# Patient Record
Sex: Female | Born: 1940 | Race: Black or African American | Hispanic: No | Smoking: Former smoker
Health system: Southern US, Community
[De-identification: ages and names within clinical notes are randomized; demographics above are authoritative.]

## PROBLEM LIST (undated history)

## (undated) DIAGNOSIS — M4800 Spinal stenosis, site unspecified: Secondary | ICD-10-CM

## (undated) DIAGNOSIS — M2550 Pain in unspecified joint: Secondary | ICD-10-CM

## (undated) DIAGNOSIS — M5430 Sciatica, unspecified side: Secondary | ICD-10-CM

## (undated) DIAGNOSIS — M1990 Unspecified osteoarthritis, unspecified site: Secondary | ICD-10-CM

## (undated) DIAGNOSIS — H04129 Dry eye syndrome of unspecified lacrimal gland: Secondary | ICD-10-CM

## (undated) DIAGNOSIS — E785 Hyperlipidemia, unspecified: Secondary | ICD-10-CM

## (undated) DIAGNOSIS — J40 Bronchitis, not specified as acute or chronic: Secondary | ICD-10-CM

## (undated) DIAGNOSIS — N3281 Overactive bladder: Secondary | ICD-10-CM

## (undated) DIAGNOSIS — E7800 Pure hypercholesterolemia, unspecified: Secondary | ICD-10-CM

## (undated) DIAGNOSIS — J449 Chronic obstructive pulmonary disease, unspecified: Secondary | ICD-10-CM

## (undated) DIAGNOSIS — K219 Gastro-esophageal reflux disease without esophagitis: Secondary | ICD-10-CM

## (undated) DIAGNOSIS — R7303 Prediabetes: Secondary | ICD-10-CM

## (undated) DIAGNOSIS — I499 Cardiac arrhythmia, unspecified: Secondary | ICD-10-CM

## (undated) DIAGNOSIS — M069 Rheumatoid arthritis, unspecified: Secondary | ICD-10-CM

## (undated) DIAGNOSIS — R682 Dry mouth, unspecified: Secondary | ICD-10-CM

## (undated) DIAGNOSIS — E669 Obesity, unspecified: Secondary | ICD-10-CM

## (undated) DIAGNOSIS — G629 Polyneuropathy, unspecified: Secondary | ICD-10-CM

## (undated) DIAGNOSIS — I10 Essential (primary) hypertension: Secondary | ICD-10-CM

## (undated) DIAGNOSIS — Z1589 Genetic susceptibility to other disease: Secondary | ICD-10-CM

## (undated) DIAGNOSIS — M810 Age-related osteoporosis without current pathological fracture: Secondary | ICD-10-CM

## (undated) DIAGNOSIS — M459 Ankylosing spondylitis of unspecified sites in spine: Secondary | ICD-10-CM

## (undated) DIAGNOSIS — J302 Other seasonal allergic rhinitis: Secondary | ICD-10-CM

## (undated) DIAGNOSIS — G5600 Carpal tunnel syndrome, unspecified upper limb: Secondary | ICD-10-CM

## (undated) DIAGNOSIS — M549 Dorsalgia, unspecified: Secondary | ICD-10-CM

## (undated) DIAGNOSIS — F419 Anxiety disorder, unspecified: Secondary | ICD-10-CM

## (undated) DIAGNOSIS — K5900 Constipation, unspecified: Secondary | ICD-10-CM

## (undated) DIAGNOSIS — K589 Irritable bowel syndrome without diarrhea: Secondary | ICD-10-CM

## (undated) HISTORY — PX: HERNIA REPAIR: SHX51

## (undated) HISTORY — DX: Spinal stenosis, site unspecified: M48.00

## (undated) HISTORY — PX: BREAST SURGERY: SHX581

## (undated) HISTORY — DX: Obesity, unspecified: E66.9

## (undated) HISTORY — DX: Dry eye syndrome of unspecified lacrimal gland: H04.129

## (undated) HISTORY — DX: Anxiety disorder, unspecified: F41.9

## (undated) HISTORY — DX: Dorsalgia, unspecified: M54.9

## (undated) HISTORY — DX: Rheumatoid arthritis, unspecified: M06.9

## (undated) HISTORY — DX: Hyperlipidemia, unspecified: E78.5

## (undated) HISTORY — PX: EYE SURGERY: SHX253

## (undated) HISTORY — PX: BACK SURGERY: SHX140

## (undated) HISTORY — DX: Constipation, unspecified: K59.00

## (undated) HISTORY — PX: TONSILLECTOMY: SUR1361

## (undated) HISTORY — DX: Sciatica, unspecified side: M54.30

## (undated) HISTORY — DX: Essential (primary) hypertension: I10

## (undated) HISTORY — DX: Pain in unspecified joint: M25.50

## (undated) HISTORY — DX: Genetic susceptibility to other disease: Z15.89

## (undated) HISTORY — DX: Dry mouth, unspecified: R68.2

---

## 1993-08-20 HISTORY — PX: ABDOMINAL HYSTERECTOMY: SHX81

## 1998-01-03 ENCOUNTER — Other Ambulatory Visit: Admission: RE | Admit: 1998-01-03 | Discharge: 1998-01-03 | Payer: Self-pay | Admitting: Nephrology

## 1998-01-04 ENCOUNTER — Other Ambulatory Visit: Admission: RE | Admit: 1998-01-04 | Discharge: 1998-01-04 | Payer: Self-pay | Admitting: Nephrology

## 1999-05-30 ENCOUNTER — Other Ambulatory Visit: Admission: RE | Admit: 1999-05-30 | Discharge: 1999-05-30 | Payer: Self-pay | Admitting: Nephrology

## 2003-07-12 ENCOUNTER — Encounter: Admission: RE | Admit: 2003-07-12 | Discharge: 2003-07-12 | Payer: Self-pay | Admitting: Neurosurgery

## 2003-07-27 ENCOUNTER — Encounter: Admission: RE | Admit: 2003-07-27 | Discharge: 2003-07-27 | Payer: Self-pay | Admitting: Neurosurgery

## 2003-08-16 ENCOUNTER — Encounter: Admission: RE | Admit: 2003-08-16 | Discharge: 2003-08-20 | Payer: Self-pay | Admitting: Neurosurgery

## 2003-08-21 ENCOUNTER — Encounter: Admission: RE | Admit: 2003-08-21 | Discharge: 2003-10-05 | Payer: Self-pay | Admitting: Neurosurgery

## 2003-10-01 ENCOUNTER — Encounter: Admission: RE | Admit: 2003-10-01 | Discharge: 2003-10-01 | Payer: Self-pay | Admitting: Neurosurgery

## 2004-01-21 ENCOUNTER — Inpatient Hospital Stay (HOSPITAL_COMMUNITY): Admission: RE | Admit: 2004-01-21 | Discharge: 2004-01-27 | Payer: Self-pay | Admitting: Neurosurgery

## 2004-03-15 ENCOUNTER — Ambulatory Visit (HOSPITAL_COMMUNITY): Admission: RE | Admit: 2004-03-15 | Discharge: 2004-03-15 | Payer: Self-pay | Admitting: Neurosurgery

## 2004-04-28 ENCOUNTER — Encounter
Admission: RE | Admit: 2004-04-28 | Discharge: 2004-07-27 | Payer: Self-pay | Admitting: Physical Medicine & Rehabilitation

## 2004-05-02 ENCOUNTER — Ambulatory Visit: Payer: Self-pay | Admitting: Physical Medicine & Rehabilitation

## 2004-07-19 ENCOUNTER — Ambulatory Visit (HOSPITAL_COMMUNITY): Admission: RE | Admit: 2004-07-19 | Discharge: 2004-07-19 | Payer: Self-pay | Admitting: Orthopedic Surgery

## 2004-08-16 ENCOUNTER — Encounter: Admission: RE | Admit: 2004-08-16 | Discharge: 2004-09-27 | Payer: Self-pay | Admitting: Orthopedic Surgery

## 2005-10-31 ENCOUNTER — Ambulatory Visit: Payer: Self-pay | Admitting: Physical Medicine & Rehabilitation

## 2005-10-31 ENCOUNTER — Inpatient Hospital Stay (HOSPITAL_COMMUNITY): Admission: RE | Admit: 2005-10-31 | Discharge: 2005-11-05 | Payer: Self-pay | Admitting: Orthopedic Surgery

## 2005-11-26 ENCOUNTER — Encounter: Admission: RE | Admit: 2005-11-26 | Discharge: 2005-12-28 | Payer: Self-pay | Admitting: Orthopedic Surgery

## 2006-02-15 ENCOUNTER — Emergency Department (HOSPITAL_COMMUNITY): Admission: EM | Admit: 2006-02-15 | Discharge: 2006-02-16 | Payer: Self-pay | Admitting: Emergency Medicine

## 2006-08-05 ENCOUNTER — Encounter: Admission: RE | Admit: 2006-08-05 | Discharge: 2006-08-05 | Payer: Self-pay | Admitting: Nephrology

## 2007-08-08 ENCOUNTER — Encounter: Admission: RE | Admit: 2007-08-08 | Discharge: 2007-08-08 | Payer: Self-pay | Admitting: Nephrology

## 2007-12-03 ENCOUNTER — Emergency Department (HOSPITAL_COMMUNITY): Admission: EM | Admit: 2007-12-03 | Discharge: 2007-12-03 | Payer: Self-pay | Admitting: Emergency Medicine

## 2008-08-26 ENCOUNTER — Encounter: Admission: RE | Admit: 2008-08-26 | Discharge: 2008-08-26 | Payer: Self-pay | Admitting: Nephrology

## 2009-02-02 ENCOUNTER — Encounter: Admission: RE | Admit: 2009-02-02 | Discharge: 2009-02-02 | Payer: Self-pay | Admitting: Nephrology

## 2009-10-27 ENCOUNTER — Encounter: Admission: RE | Admit: 2009-10-27 | Discharge: 2009-10-27 | Payer: Self-pay | Admitting: Specialist

## 2009-10-31 ENCOUNTER — Ambulatory Visit
Admission: RE | Admit: 2009-10-31 | Discharge: 2009-11-01 | Payer: Self-pay | Source: Home / Self Care | Admitting: Specialist

## 2010-11-12 LAB — BASIC METABOLIC PANEL
Calcium: 9.3 mg/dL (ref 8.4–10.5)
Chloride: 102 mEq/L (ref 96–112)
Creatinine, Ser: 0.78 mg/dL (ref 0.4–1.2)
GFR calc non Af Amer: >60 mL/min (ref >60)
Potassium: 4.3 mEq/L (ref 3.5–5.1)
Sodium: 134 mEq/L — ABNORMAL LOW (ref 135–145)

## 2011-01-05 NOTE — Op Note (Signed)
NAMEMarland Kitchen  Joy [name], Joy [name] NO.:  [id number]   MEDICAL RECORD NO.:  [id number]          PATIENT TYPE:  OIB   LOCATION:  2899                         FACILITY:  [facility]   PHYSICIAN:  Myrtie Neither, MD      DATE OF BIRTH:  October 06, 1940   DATE OF PROCEDURE:  07/19/2004  DATE OF DISCHARGE:                                 OPERATIVE REPORT   PREOPERATIVE DIAGNOSIS:  1.  Internal derangement.  2.  Medial meniscal tear.  3.  Synovitis.   POSTOPERATIVE DIAGNOSES:  1.  Internal derangement.  2.  Medial meniscal tear, posterior horn.  3.  Lateral meniscal tear.  4.  Gouty synovitis.   ANESTHESIA:  General.   PROCEDURE:  Arthroscopic medial and lateral meniscectomy, complete  synovectomy.   The patient was taken to the operating room after given adequate preop  medications, given general anesthesia and intubated.  The right knee was  prepped with Duraprep and draped in a sterile manner.  A tourniquet used for  hemostasis.  A 1/2 inch puncture wound made along the anterior, medial, and  lateral joint lines.  Inflow was through the medial suprapatellar pouch  area.  Inspection of the joint revealed tophaceous gouty crystals throughout  the joint, a tear of the medial meniscal along the posterior horn, and tear  of the lateral meniscus along the midsubstance and posteriorly.  Anterior  cruciate was intact.  Thickened synovium, both medial and lateral  compartment and suprapatellar pouch area.  Complete synovectomy was done  with the synovial shaver, followed by partial resection of the medial  meniscus with basket forceps as well as the lateral meniscus.  A meniscal  shaver was used for further debridement of the joint and chondroplasty of  the medial compartment, medial femoral condyle.  There was a breakdown of  the articular surface with gouty involvement.  After a thorough debridement  and irrigation of the joint, the joint was then closed with 4-0 nylon.  Marcaine 0.25%  plain 10 mL was placed into the knee.  A compressive dressing  was applied.  The patient tolerated the procedure quite well and went to the  recovery room in a stable and satisfactory condition.  The patient is being  discharged home on Percocet 10/650 mg q.4h. p.r.n. for pain, partial  weightbearing, ice packs, elevation, use of crutches, and to return to the  office in one week.  The patient is also placed in indomethacin 75 mg one  b.i.d.  The patient is being discharged in stable and satisfactory  condition.      AC/MEDQ  D:  07/19/2004  T:  07/19/2004  Job:  [id number]

## 2011-01-05 NOTE — Op Note (Signed)
NAMEVERYL, ABRIL NO.:  [id number]   MEDICAL RECORD NO.:  [id number]          PATIENT TYPE:  INP   LOCATION:  X002                         FACILITY:  [facility]   PHYSICIAN:  Ollen Gross, M.D.    DATE OF BIRTH:  1941/02/23   DATE OF PROCEDURE:  10/31/2005  DATE OF DISCHARGE:                                 OPERATIVE REPORT   PREOPERATIVE DIAGNOSIS:  Osteoarthritis right knee.   POSTOPERATIVE DIAGNOSIS:  Osteoarthritis right knee.   PROCEDURE:  Right total knee arthroplasty.   SURGEON:  Ollen Gross, M.D.   ASSISTANT:  Avel Peace, PA-C.   ANESTHESIA:  General with postop Marcaine pain pump.   ESTIMATED BLOOD LOSS:  Minimal.   DRAINS:  Hemovac x1.   TOURNIQUET TIME:  46 minutes at 300 mmHg.   COMPLICATIONS:  None.   CONDITION:  Stable to recovery room.   CLINICAL NOTE:  [name] is a 70 year old female who had severe end-stage  osteoarthritis of the right knee with intractable pain. She has failed  nonoperative management including injections. She has an allergy to nickel  and cobalt thus we had to use a custom-made titanium femoral component. She  presents now for total knee arthroplasty.   PROCEDURE IN DETAIL:  After successful administration of general anesthetic,  a tourniquet was placed high on the right thigh and right lower extremity  prepped and draped in the usual sterile fashion. Extremity was wrapped in  Esmarch, knee flexed and tourniquet inflated to 300 mmHg. A midline incision  is made with a 10 blade through the subcutaneous tissue to the level of the  extensor mechanism. A fresh blade is used to make a medial parapatellar  arthrotomy. The soft tissue over the proximal medial tibia is  subperiosteally elevated to the joint line with a knife and into the  semimembranosus bursa with a Cobb elevator. The soft tissue over the  proximal lateral tibia is elevated with attention being paid to avoiding the  patellar tendon on the tibial  tubercle. Patella is everted, knee flexed 90  degrees and ACL and PCL removed. A drill was used to create a starting hole  in the distal femur, the canal was thoroughly irrigated. A 5 degree right  valgus alignment guide is placed. Referencing off the posterior condyles,  rotation is marked and the block pinned to remove 10 mm off the distal  femur. Distal femoral resection is made with an oscillating saw. A sizing  block is placed, size 3 is the most appropriate. Rotation is marked off the  epicondylar axis. A size 3 cutting block is placed and then the anterior,  posterior and chamfer cuts are made.   The tibia is subluxed forward and the menisci are removed. The  extramedullary tibial alignment guide is placed referencing proximally at  the medial aspect of tibial tubercle and distally along the second  metatarsal axis and tibial crest. The block is pinned to remove 10 mm from  the nondeficient lateral side. Tibial resection is made an oscillating saw.  A size 3 is also the most appropriate tibial component. We completed  our  femoral preparation with the intercondylar cut on the femur. The size 3  posterior stabilized femoral trial was placed. We then placed the size 3  fixed bearing tibial trial with a 10-mm fixed bearing posterior stabilized  insert. We placed the knee through a range of motion to mark the rotation of  the tibial tray. She had great stability with full extension and excellent  varus and valgus balance throughout full range of motion. The patella was  then everted, thickness measured to be 20 mm. Freehand resection is taken to  11 mm, 35 template is placed, lug holes are drilled, trial patella is placed  and it tracks normally. The proximal tibia is then prepared with the modular  drill and keel punch for the fixed bearing component. The osteophytes are  then removed off the posterior femur with the trial in place. All trials are  removed and the cut bone surfaces  prepared with pulsatile lavage. The cement  is mixed and once ready for implantation, the size 3 fixed bearing tibial  tray, size 3 posterior stabilized femur and the 35 patella are cemented into  place and the patella is held with a clamp. A trial 10 mm insert is placed,  knee held in full extension and all extruded cement removed. Please note  that the femur and tibia are both titanium because of her allergy. Once the  cement is fully hardened then the permanent 10-mm fixed bearing posterior  stabilized insert is placed in the tibial tray. The wound was copiously  irrigated with saline solution and the extensor mechanism closed over a  Hemovac drain with interrupted #1 PDS. Flexion against gravity is about 135  degrees. The tourniquet was released with a total time of 46 minutes. The  subcu was closed with interrupted 2-0 Vicryl, subcuticular with running 4-0  Monocryl. Incision is cleaned and dried and Steri-Strips and a bulky sterile  dressing applied. Prior to this, I had placed the catheter for the Marcaine  pain pump and the pump was initiated. The knee was placed into the knee  immobilizer, the patient is then awakened and transported to recovery in  stable condition.      Ollen Gross, M.D.  Electronically Signed     FA/MEDQ  D:  10/31/2005  T:  11/01/2005  Job:  [id number]

## 2011-01-05 NOTE — H&P (Signed)
Joy [name], SITTS NO.:  [id number]   MEDICAL RECORD NO.:  [id number]          PATIENT TYPE:  INP   LOCATION:  1505                         FACILITY:  [facility]   PHYSICIAN:  Ollen Gross, M.D.    DATE OF BIRTH:  [birth year]   DATE OF ADMISSION:  10/31/2005  DATE OF DISCHARGE:  11/05/2005                                HISTORY & PHYSICAL   CHIEF COMPLAINT:  Right knee pain.   HISTORY OF PRESENT ILLNESS:  Patient is a 70 year old female who has been  seen by Dr. Lequita Halt for ongoing right knee pain.  She is scheduled to have a  right total knee arthroplasty.  She had to have work-up for metal allergies.  She has had ongoing knee pain for quite some time now.  She had been seen by  Dr. Myrtie Neither in the past.  She initially had a knee arthroscopy done  back in November of 2005, and had some initial improvement following that,  but shortly after, she was noted to be bone on bone with progressive  arthritis.  She has been seen by Dr. Lequita Halt and felt she has reached a  point where she would benefit from undergoing knee replacement.  She has  been worked up for her allergies and had history compatible with latex  allergy and also contact allergy to nickel and cobalt chrome.  Custom  titanium implant had to be made.  Once the custom implant was made, she was  scheduled for surgery.   ALLERGIES:  LATEX allergy and METAL allergy to NICKEL, COBALT CHROME and  also STAPLES.   PAST MEDICAL HISTORY:  1.  Hypercholesterolemia.  2.  Hypertension.  3.  Restless leg syndrome.  4.  Previous history of transfusions.  5.  Gout.   PAST SURGICAL HISTORY:  1.  Right knee arthroscopy.  2.  Back surgery.  3.  Hysterectomy.   FAMILY HISTORY:  Significant for diabetes, cholesterol and blood pressure.   SOCIAL HISTORY:  Divorced, single, one daughter.  Nonsmoker.  Very seldom  intake of alcohol.   CURRENT MEDICATIONS:  Indocin, Lipitor, Diovan/hydrochlorothiazide, Lyrica,  glucosamine, Percocet, multivitamin, Omega III.   REVIEW OF SYSTEMS:  NEUROLOGIC:  No seizures, syncope or paralysis.  RESPIRATORY:  She did have a cold over the holidays but has resolved.  No  shortness of breath, productive cough or hemoptysis.  CARDIOVASCULAR:  No  chest pain, angina or orthopnea.  GI:  No nausea, vomiting, diarrhea or  constipation.  GU:  No dysuria or hematuria or discharge.  MUSCULOSKELETAL:  Right knee.   PHYSICAL EXAMINATION:  GENERAL APPEARANCE:  A 70 year old [place]  female, well-nourished, well-developed, overweight, short stature, no acute  distress, she is alert and oriented, cooperative and pleasant.  VITAL SIGNS:  Pulse 60, respirations 12, blood pressure 132/74.  HEENT:  Normocephalic and atraumatic.  Pupils round and reactive.  Oropharynx clear.  EOM intact.  She does have upper and lower dentures.  NECK:  Supple.  CHEST:  Clear anterior and posterior chest walls.  CARDIOVASCULAR:  Regular rate and rhythm, no murmur, S1 and S2 noted.  ABDOMEN:  Soft, slightly round, slightly protuberant abdomen.  Bowel sounds  present.  BREASTS/GENITALIA:  Not done.  Not pertinent.  EXTREMITIES:  Right knee:  Right knee shows marked crepitus noted on passive  range of motion.  Lacks about 10 degrees of full extension with flexion of  about 115 degrees, no instability.   IMPRESSION:  1.  Osteoarthritis right knee.  2.  Hypercholesterolemia.  3.  Hypertension.  4.  Restless leg syndrome.  5.  History of transfusion.  6.  Gout.  7.  Latex allergies.  8.  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   PLAN:  Patient admitted to [facility] to undergo right total knee  arthroplasty.  Surgery will be performed by Ollen Gross, M.D.      Alexzandrew L. Julien Girt, P.A.      Ollen Gross, M.D.  Electronically Signed    ALP/MEDQ  D:  11/18/2005  T:  11/20/2005  Job:  [id number]

## 2011-01-05 NOTE — Discharge Summary (Signed)
Joy [name], ION NO.:  [id number]   MEDICAL RECORD NO.:  [id number]          PATIENT TYPE:  INP   LOCATION:  1505                         FACILITY:  [facility]   PHYSICIAN:  Ollen Gross, M.D.    DATE OF BIRTH:  [birth year]   DATE OF ADMISSION:  10/31/2005  DATE OF DISCHARGE:  11/05/2005                                 DISCHARGE SUMMARY   ADMITTING DIAGNOSES:  1.  Osteoarthritis, right knee.  2.  Hypercholesterolemia.  3.  Hypertension.  4.  Restless leg syndrome.  5.  Gout.  6.  History of transfusion.  7.  Latex allergy.  8.  Nickel allergy.   DISCHARGE DIAGNOSES:  1.  Osteoarthritis, right knee, status post right total knee arthroplasty.  2.  Postoperative blood loss anemia, did not require transfusion.  3.  Hypercholesterolemia.  4.  Hypertension.  5.  Restless leg syndrome.  6.  Gout.  7.  History of transfusion.  8.  Latex allergy.  9.  Nickel allergy.   PROCEDURE:  On October 31, 2005, right total knee surgery by Dr. Lequita Halt,  assistant Julien Girt, anesthesia general, tourniquet time 46 minutes at 300  mmHg.   CONSULTS:  Rehab services.   BRIEF HISTORY:  Ms. Juneau is a 70 year old female with severe end-stage  arthritis of the right knee with intractable pain who failed operative  management including injections.  She has an allergy to nickel and cobalt,  thus made a custom-made titanium femoral component.  Now presents for total  knee arthroplasty.   LABORATORY DATA:  CBC on admission showed hemoglobin of 13.3, hematocrit of  38.6, white cell count normal at 5.4, serial H&H.s. were followed.  Hemoglobin dropped to 10.1, drifted down to 9.9, last noted at 9.3 and 27.8  hematocrit.  PT and PTT on admission 12.4 and 33, respectively.  Serum  protimes followed.  Last noted PT INR 18.3 and 1.5.  Chem-12 on admission  within normal limits.  Serum BMETs were followed.  Electrolytes remained  within normal limits.  Pre __________ negative.   Chest  x-ray October 26, 2005, no acute cardiopulmonary disease.   EKG dated September 14, 2005 was a faxed tracing.  Sinus rhythm, unconfirmed.   HOSPITAL COURSE:  Admitted to [facility], tolerated the procedure  well, later to the recovery room and the orthopedic floor.  Started on PCA  and p.o. analgesics for pain following surgery.  Given postop IV  antibiotics.  On day 1, was doing fairly well but in some pain, using p.o.  medications.  Supplementing with PCAs.  Started getting up with physical  therapy.  By the following day, was doing a little bit better.  By day 2,  she was doing a little bit better, just felt stiff.  Wanted to look into  [facility].  Therefore, consult was called.  The patient was seen  in evaluation and felt that she would be okay for SACU level.  Decided she  would be transferred over at which time a bed became available.  She started  getting up with physical therapy a  little bit more on day 2 and actually  walked 60 feet.  Dressing changed on day 2.  Incision looked good.  Continue  with daily dressing changes.  By day 3, she continued to progress from a  therapy standpoint, got up to about 80 feet and then later 200 feet.  She  was followed through the weekend and was progressing well.  Unfortunately,  they felt she was too high level for SACU unit and also that she was unable  to go home, however, therefore, discharge planning looked for a bed.  Bed  officer sent out unable assign.  Bed became available at [facility] extended  care facility.  She was seen on Monday rounds by Dr. [name] Foot.  Bed was  available.  She was doing well postoperatively and she was transferred out  at that time.   DISCHARGE PLAN:  Patient transferred over to [facility] extended care.   DISCHARGE DIAGNOSES:  Please see above.   DISCHARGE MEDICATIONS:  1.  Current medications Coumadin protocol.  Please titrate the INR between 2-      3.  She needs to be on Coumadin for a  total of three weeks from date of      surgery which was October 31, 2005.  2.  Colace 100 mg p.o. b.i.d.  3.  She is also to take Diovan hydrochlorothiazide 160/25 p.o. daily.  4.  Lipitor 20 mg daily.  5.  Lyrica 75 mg p.o. q.h.s.  6.  Percocet one or two every 4-6 hours as needed for pain.  7.  Tylenol one or two every 4-6 hours as needed for non-pain type of      headache.  8.  Robaxin 500 mg  p.o. q.6-8h. p.r.n. spasm.  9.  Restoril 15-30 mg p.o. q.h.s. p.r.n. sleep.  10. Laxative of choice.  11. Enema of choice.  12. Mylanta 30 mL p.o. p.r.n.   DIET:  Cardiac diet.   ACTIVITY:  She is weightbearing as tolerated to the right lower extremity.  Continue gait training, ambulation, ADLs as per PT and OT while on the  extended care unit.  Daily dressing changes to the knee.  She may start  showering.  Needs to be up of bed minimum b.i.d. and continue total knee  protocol.   FOLLOWUP:  The patient will follow up with Dr.Aluisio in the office two  weeks from surgery, contact the office at [phone] to arrange for a time of  transfer of the patient over for further evaluation and care.   DISPOSITION:  [facility].   CONDITION ON DISCHARGE:  Improved.      Alexzandrew L. Julien Girt, P.A.      Ollen Gross, M.D.  Electronically Signed    ALP/MEDQ  D:  11/05/2005  T:  11/05/2005  Job:  [id number]   cc:   Mila Homer. [name] Foot, M.D.  Fax: [phone]   Jarome Matin, M.D.  Fax: [phone]

## 2011-01-05 NOTE — H&P (Signed)
NAMEMarland Kitchen  Joy [name], Joy [name] NO.:  [id number]   MEDICAL RECORD NO.:  [id number]          PATIENT TYPE:  OIB   LOCATION:  2899                         FACILITY:  [facility]   PHYSICIAN:  Myrtie Neither, MD      DATE OF BIRTH:  06/28/1941   DATE OF ADMISSION:  07/19/2004  DATE OF DISCHARGE:                                HISTORY & PHYSICAL   CHIEF COMPLAINT:  Painful, swollen right knee.   HISTORY OF PRESENT ILLNESS:  This is a 70 year old female who has been  followed in the office for internal derangement and synovitis involving the  right knee.  Over the past year the patient has been treated with  therapeutic injections, anti-inflammatories with progressive worsening in  the past few months.  MRI demonstrates a meniscal tear of the medial  meniscus.   PAST MEDICAL HISTORY:  1.  Degenerative arthritis.  2.  High blood pressure.  3.  Degenerative disk disease.  4.  Spinal fusion, lumbar.   FAMILY HISTORY:  Noncontributory.   SOCIAL HISTORY:  The patient denies use of alcohol or tobacco.   REVIEW OF SYSTEMS:  Episodic lower back and leg pain.  No cardiac or  respiratory symptoms.  No urinary or bowel symptoms.   ALLERGIES:  NICKEL, STAPLES, LATEX.   MEDICATIONS:  1.  Diovan/HCTZ 160/25 mg daily.  2.  Sulindac 200 mg b.i.d.  3.  Oxycodone one q.4h. p.r.n.   PHYSICAL EXAMINATION:  GENERAL:  Alert and oriented, in no acute distress.  VITAL SIGNS:  Temperature 98.7, pulse 80, respirations 18, blood pressure  110/60.  Height 62-1/2 inches, weight 219.  HEENT:  Head normocephalic.  Eyes:  Conjunctivae and sclerae clear.  NECK:  Supple.  CHEST:  Clear.  CARDIAC:  S1, S2, regular.  EXTREMITIES:  Right knee genu varum, tender medial compartment.  There is a  palpable and audible click.  Tender also lateral compartment with moderate  effusion.  Negative drawer, negative Lachman, negative pivot shift.   MRI demonstrates a meniscal tear.   IMPRESSION:  1.  Internal  derangement, right knee.  2.  Meniscal tear.  3.  Synovitis, right knee.   PLAN:  Arthroscopy of the right knee.      AC/MEDQ  D:  07/19/2004  T:  07/19/2004  Job:  [id number]

## 2011-01-05 NOTE — Op Note (Signed)
NAMESCARLETTROSE, COSTILOW                        ACCOUNT NO.:  [id number]   MEDICAL RECORD NO.:  [id number]                   PATIENT TYPE:  INP   LOCATION:  3314                                 FACILITY:  [facility]   PHYSICIAN:  Coletta Memos, M.D.                  DATE OF BIRTH:  Oct 28, 1940   DATE OF PROCEDURE:  01/21/2004  DATE OF DISCHARGE:                                 OPERATIVE REPORT   PREOPERATIVE DIAGNOSES:  1. Lumbar spondylosis, L3-4, L4-5, L5-S1.  2. Lumbar stenosis, L3-4, L4-5.  3. Spondylolisthesis, L3-4.  4. Displaced disk, L3-4, L4-5.  5. Lumbar radiculopathy.   POSTOPERATIVE DIAGNOSES:  1. Lumbar spondylosis, L3-4, L4-5, L5-S1.  2. Lumbar stenosis, L3-4, L4-5.  3. Spondylolisthesis, L3-4.  4. Displaced disk, L3-4, L4-5.  5. Lumbar radiculopathy.   PROCEDURES:  1. Posterolateral arthrodesis, L3 to S1.  2. Segmental instrumentation, posterolateral, L3 to S1.  3. Posterior lumbar interbody fusion, L3-4.  4. Posterior lumbar interbody fusion L4-5.  5. Lumbar decompression, L5-S1.  6. Morcellized autograft.   SURGEON:  Coletta Memos, M.D.   ASSISTANT:  Stefani Dama, M.D.   COMPLICATIONS:  None.   INDICATIONS:  Joy [name] is a woman whom I have taken care of for some  time.  She has profound stenosis at L3-4 and at L4-5.  She has  spondylolisthesis at L3-4, low back pain and lumbar radiculopathy, and  displaced disk at L3-4 and L4-5.  I recommended and she agreed to undergo a  lumbar decompression and posterolateral arthrodesis with PLIF on the lumbar  spine to decompress all of the nerve roots from L3 through S1 and to provide  for stability after removal of the bone.   OPERATIVE NOTE:  [name] was brought to the operating room, intubated,  and placed under general anesthetic.  She then had a Foley catheter placed  under sterile conditions.  Using Symphony platelet gel, 60 mL of blood was  removed from an IV site.  She was then flipped prone onto body  rolls and all  pressure points were properly padded.  Skin was prepped, and she was draped  in a sterile fashion.  I infiltrated 40 mL of 0.5% lidocaine and 1:200,000  strength epinephrine into the paraspinous musculature and lumbar region.  I  opened the skin with a #10 blade and took this down to the thoracolumbar  fascia.  I then exposed the laminae of L2, L3, L4, L5, and of S1  bilaterally.  I took an x-ray to show where I was and using that as a guide,  I then proceeded with exposure of the transverse processes of L3, L4, L5,  and the sacral alae.  I then with Dr. Verlee Rossetti assistance performed  diskectomies at L3-4 and at L4-5.  Four 11 mm PLIF Peek cages were placed  filled with local autograft which was morcellized.  This was done without  difficulty.  Then using the frameless stereotactic guidance, pedicle screws  were placed, two screws in L3, two in L4, two in L5, and two in S1, first by  drilling, using a probe, and then tapping the screw hole.  X-rays showed the  screws to be in good position.  The PLIF cages were in good position.  A  significant amount of decompression was performed at all levels.  The L3,  L4, L5, and S1 nerve roots were free of any pressure at the end of the  operation.  I then closed the wound in a layered fashion using Vicryl  sutures.  Steri-Strips were used for a sterile dressing.                                               Coletta Memos, M.D.    KC/MEDQ  D:  01/21/2004  T:  01/22/2004  Job:  [id number]

## 2011-01-05 NOTE — Discharge Summary (Signed)
Joy [name], Joy [name]                        ACCOUNT NO.:  [id number]   MEDICAL RECORD NO.:  [id number]                   PATIENT TYPE:  INP   LOCATION:  3038                                 FACILITY:  [facility]   PHYSICIAN:  Coletta Memos, M.D.                  DATE OF BIRTH:  10-08-1940   DATE OF ADMISSION:  01/21/2004  DATE OF DISCHARGE:  01/27/2004                                 DISCHARGE SUMMARY   ADMISSION DIAGNOSES:  1. Lumbar stenosis L3-L4, L4-L5, L5-S1.  2. Lumbar spondylolisthesis L3-L4.  3. Degenerative disc disease L3-L4, L4-L5, L5-S1.  4. Lumbar radiculopathy.   DISCHARGE DIAGNOSES:  1. Lumbar stenosis L3-L4, L4-L5, L5-S1.  2. Lumbar spondylolisthesis L3-L4.  3. Degenerative disc disease L3-L4, L4-L5, L5-S1.  4. Lumbar radiculopathy.   PROCEDURE:  L3 to S1 posterior lateral arthrodesis, pedicle screw fixation,  posterior lumbar interbody fusion L3-L4, L4-L5, L5-S1 decompression.   COMPLICATIONS:  None.   CONDITION ON DISCHARGE:  Alive and well.   DISCHARGE MEDICATIONS:  1. Darvocet-N 100 one to two tablets q.6h. PRN pain.  2. Flexeril 10 mg p.o. q.6h. PRN pain.  3. Decadron taper 4 mg p.o. t.i.d. January 27, 2004, 4 mg p.o. b.i.d. January 28, 2004, 4 mg daily January 29, 2004, then discontinue.   HISTORY OF PRESENT ILLNESS:  The patient is a 70 year old woman who  presented with severe pain in back and lower extremities in the fall of  2004.  MRI revealed severe lumbar stenosis at L3-L4 and L4-L5,  spondylolisthesis at L3-L4 secondary to facet arthropathy and facet  arthropathy also present at L5-S1 and a severely degenerated disc at L4-L5  and severely degenerated disc at L5-S1.  Conservative treatment did not help  as much as the patient would like so we therefore elected to proceed with  operative depression.  The patient was admitted and had an uncomplicated  procedure.  Postoperatively she has actually done very well with her pain.  Her wound has been clean, dry  and without signs of infection.  She did have  an allergic reaction, breaking out in hives and a pruritic, erythematous  rash throughout her body.  This was treated with Benadryl with Decadron and  Pepcid.  She was taken off the morphine PCA.  I also removed her from  Percocet.  She had previously taken Percocet, however, without problems.  As  of now, however, I will keep her off the Percocet as it is not clear whether  was contributing.  The rash started, however, before she started taking the Percocet so more  than likely this was due to morphine.  Her wound at discharge is clean, dry  and without signs of infection.  She is ambulating, has had physical therapy  and will be discharged home.  Coletta Memos, M.D.    KC/MEDQ  D:  01/27/2004  T:  01/28/2004  Job:  [id number]

## 2011-01-17 ENCOUNTER — Other Ambulatory Visit (HOSPITAL_COMMUNITY): Payer: Self-pay | Admitting: Neurosurgery

## 2011-01-17 DIAGNOSIS — M545 Low back pain, unspecified: Secondary | ICD-10-CM

## 2011-01-25 ENCOUNTER — Ambulatory Visit (HOSPITAL_COMMUNITY)
Admission: RE | Admit: 2011-01-25 | Discharge: 2011-01-25 | Disposition: A | Discharge status: Home / Self Care | Payer: Medicare Other | Source: Ambulatory Visit | Attending: Neurosurgery | Admitting: Neurosurgery

## 2011-01-25 DIAGNOSIS — Y849 Medical procedure, unspecified as the cause of abnormal reaction of the patient, or of later complication, without mention of misadventure at the time of the procedure: Secondary | ICD-10-CM | POA: Insufficient documentation

## 2011-01-25 DIAGNOSIS — M545 Low back pain, unspecified: Secondary | ICD-10-CM

## 2011-01-25 DIAGNOSIS — M5126 Other intervertebral disc displacement, lumbar region: Secondary | ICD-10-CM | POA: Insufficient documentation

## 2011-01-25 DIAGNOSIS — T8489XA Other specified complication of internal orthopedic prosthetic devices, implants and grafts, initial encounter: Secondary | ICD-10-CM | POA: Insufficient documentation

## 2011-01-25 MED ORDER — IOHEXOL 180 MG/ML  SOLN
20.0000 mL | Freq: Once | INTRAMUSCULAR | Status: AC | PRN
  Administered 2011-01-25: 20 mL via INTRATHECAL

## 2011-05-15 LAB — POCT RAPID STREP A: Streptococcus, Group A Screen (Direct): POSITIVE — AB

## 2011-08-28 ENCOUNTER — Ambulatory Visit
Admission: RE | Admit: 2011-08-28 | Discharge: 2011-08-28 | Disposition: A | Discharge status: Home / Self Care | Payer: Medicare Other | Source: Ambulatory Visit | Attending: Nephrology | Admitting: Nephrology

## 2011-08-28 ENCOUNTER — Other Ambulatory Visit: Payer: Self-pay | Admitting: Nephrology

## 2011-08-28 DIAGNOSIS — R0602 Shortness of breath: Secondary | ICD-10-CM | POA: Diagnosis not present

## 2011-08-28 DIAGNOSIS — R059 Cough, unspecified: Secondary | ICD-10-CM

## 2011-08-28 DIAGNOSIS — R9389 Abnormal findings on diagnostic imaging of other specified body structures: Secondary | ICD-10-CM | POA: Diagnosis not present

## 2011-08-28 DIAGNOSIS — R05 Cough: Secondary | ICD-10-CM

## 2011-08-28 DIAGNOSIS — R062 Wheezing: Secondary | ICD-10-CM | POA: Diagnosis not present

## 2011-10-02 DIAGNOSIS — M5430 Sciatica, unspecified side: Secondary | ICD-10-CM | POA: Diagnosis not present

## 2011-10-02 DIAGNOSIS — M545 Low back pain: Secondary | ICD-10-CM | POA: Diagnosis not present

## 2011-10-02 DIAGNOSIS — M25569 Pain in unspecified knee: Secondary | ICD-10-CM | POA: Diagnosis not present

## 2011-10-05 DIAGNOSIS — M1710 Unilateral primary osteoarthritis, unspecified knee: Secondary | ICD-10-CM | POA: Diagnosis not present

## 2011-10-11 DIAGNOSIS — M1710 Unilateral primary osteoarthritis, unspecified knee: Secondary | ICD-10-CM | POA: Diagnosis not present

## 2011-10-18 DIAGNOSIS — M1710 Unilateral primary osteoarthritis, unspecified knee: Secondary | ICD-10-CM | POA: Diagnosis not present

## 2011-10-26 DIAGNOSIS — M19049 Primary osteoarthritis, unspecified hand: Secondary | ICD-10-CM | POA: Diagnosis not present

## 2011-11-01 DIAGNOSIS — Z1231 Encounter for screening mammogram for malignant neoplasm of breast: Secondary | ICD-10-CM | POA: Diagnosis not present

## 2011-11-05 DIAGNOSIS — H04129 Dry eye syndrome of unspecified lacrimal gland: Secondary | ICD-10-CM | POA: Diagnosis not present

## 2011-11-05 DIAGNOSIS — H35039 Hypertensive retinopathy, unspecified eye: Secondary | ICD-10-CM | POA: Diagnosis not present

## 2011-11-05 DIAGNOSIS — H10509 Unspecified blepharoconjunctivitis, unspecified eye: Secondary | ICD-10-CM | POA: Diagnosis not present

## 2011-11-07 DIAGNOSIS — M25519 Pain in unspecified shoulder: Secondary | ICD-10-CM | POA: Diagnosis not present

## 2011-11-19 DIAGNOSIS — R209 Unspecified disturbances of skin sensation: Secondary | ICD-10-CM | POA: Diagnosis not present

## 2011-11-21 DIAGNOSIS — R05 Cough: Secondary | ICD-10-CM | POA: Diagnosis not present

## 2011-11-22 DIAGNOSIS — M25569 Pain in unspecified knee: Secondary | ICD-10-CM | POA: Diagnosis not present

## 2011-11-22 DIAGNOSIS — M5430 Sciatica, unspecified side: Secondary | ICD-10-CM | POA: Diagnosis not present

## 2011-11-22 DIAGNOSIS — M545 Low back pain: Secondary | ICD-10-CM | POA: Diagnosis not present

## 2011-11-25 DIAGNOSIS — J42 Unspecified chronic bronchitis: Secondary | ICD-10-CM | POA: Diagnosis not present

## 2011-11-25 DIAGNOSIS — N186 End stage renal disease: Secondary | ICD-10-CM | POA: Diagnosis not present

## 2011-11-25 DIAGNOSIS — R05 Cough: Secondary | ICD-10-CM | POA: Diagnosis not present

## 2011-11-25 DIAGNOSIS — E7800 Pure hypercholesterolemia, unspecified: Secondary | ICD-10-CM | POA: Diagnosis not present

## 2011-11-25 DIAGNOSIS — I10 Essential (primary) hypertension: Secondary | ICD-10-CM | POA: Diagnosis not present

## 2011-11-27 DIAGNOSIS — R209 Unspecified disturbances of skin sensation: Secondary | ICD-10-CM | POA: Diagnosis not present

## 2011-11-30 DIAGNOSIS — R209 Unspecified disturbances of skin sensation: Secondary | ICD-10-CM | POA: Diagnosis not present

## 2011-12-04 DIAGNOSIS — M25519 Pain in unspecified shoulder: Secondary | ICD-10-CM | POA: Diagnosis not present

## 2011-12-06 DIAGNOSIS — M545 Low back pain: Secondary | ICD-10-CM | POA: Diagnosis not present

## 2011-12-06 DIAGNOSIS — M25569 Pain in unspecified knee: Secondary | ICD-10-CM | POA: Diagnosis not present

## 2011-12-06 DIAGNOSIS — M5430 Sciatica, unspecified side: Secondary | ICD-10-CM | POA: Diagnosis not present

## 2011-12-07 DIAGNOSIS — M25519 Pain in unspecified shoulder: Secondary | ICD-10-CM | POA: Diagnosis not present

## 2011-12-11 DIAGNOSIS — M25519 Pain in unspecified shoulder: Secondary | ICD-10-CM | POA: Diagnosis not present

## 2011-12-14 DIAGNOSIS — M25519 Pain in unspecified shoulder: Secondary | ICD-10-CM | POA: Diagnosis not present

## 2011-12-16 ENCOUNTER — Encounter (HOSPITAL_BASED_OUTPATIENT_CLINIC_OR_DEPARTMENT_OTHER): Payer: Self-pay | Admitting: *Deleted

## 2011-12-16 ENCOUNTER — Emergency Department (HOSPITAL_BASED_OUTPATIENT_CLINIC_OR_DEPARTMENT_OTHER)
Admission: EM | Admit: 2011-12-16 | Discharge: 2011-12-16 | Disposition: A | Discharge status: Home / Self Care | Payer: Medicare Other | Attending: Emergency Medicine | Admitting: Emergency Medicine

## 2011-12-16 DIAGNOSIS — Z87891 Personal history of nicotine dependence: Secondary | ICD-10-CM | POA: Insufficient documentation

## 2011-12-16 DIAGNOSIS — E7800 Pure hypercholesterolemia, unspecified: Secondary | ICD-10-CM | POA: Diagnosis not present

## 2011-12-16 DIAGNOSIS — M25512 Pain in left shoulder: Secondary | ICD-10-CM

## 2011-12-16 DIAGNOSIS — Z79899 Other long term (current) drug therapy: Secondary | ICD-10-CM | POA: Insufficient documentation

## 2011-12-16 DIAGNOSIS — M19019 Primary osteoarthritis, unspecified shoulder: Secondary | ICD-10-CM | POA: Insufficient documentation

## 2011-12-16 DIAGNOSIS — I10 Essential (primary) hypertension: Secondary | ICD-10-CM | POA: Diagnosis not present

## 2011-12-16 DIAGNOSIS — M25519 Pain in unspecified shoulder: Secondary | ICD-10-CM | POA: Diagnosis not present

## 2011-12-16 DIAGNOSIS — M25511 Pain in right shoulder: Secondary | ICD-10-CM

## 2011-12-16 HISTORY — DX: Unspecified osteoarthritis, unspecified site: M19.90

## 2011-12-16 HISTORY — DX: Essential (primary) hypertension: I10

## 2011-12-16 HISTORY — DX: Pure hypercholesterolemia, unspecified: E78.00

## 2011-12-16 HISTORY — DX: Carpal tunnel syndrome, unspecified upper limb: G56.00

## 2011-12-16 MED ORDER — HYDROCODONE-ACETAMINOPHEN 5-325 MG PO TABS: 1.0000 | ORAL_TABLET | ORAL | Status: AC | PRN

## 2011-12-16 MED ORDER — HYDROCODONE-ACETAMINOPHEN 5-325 MG PO TABS
1.0000 | ORAL_TABLET | Freq: Once | ORAL | Status: AC
  Administered 2011-12-16: 1 via ORAL
  Filled 2011-12-16: qty 1

## 2011-12-16 NOTE — ED Provider Notes (Signed)
History     CSN: [id number]  Arrival date & time 12/16/11  1341   First MD Initiated Contact with Patient 12/16/11 1426      Chief Complaint  Patient presents with  . Arm Pain    (Consider location/radiation/quality/duration/timing/severity/associated sxs/prior treatment) HPI  complains of bilateral shoulder , pain, sharp in quality nonradiating moderate to severe since March 2013. Left greater than right has been treated with  Ketorolac and tramadol without adequate pain relief pain is worse with movement improved with remaining still left shoulder is particularly worse with abduction of shoulder. Pain is moderate however severe when she moves her shoulders. No fever no injury no other complaint. Patient was to have MRI of her left shoulder performed last week however could not cooperate with the study due to pain. No chest pain no shortness of breath no fever no other associated symptoms fPast Medical History  Diagnosis Date  . CTS (carpal tunnel syndrome)   . Hypertension   . Hypercholesteremia   . Arthritis     Past Surgical History  Procedure Date  . Abdominal hysterectomy   . Knee surgery   . Breast surgery   . Back surgery     History reviewed. No pertinent family history.  History  Substance Use Topics  . Smoking status: Former Games developer  . Smokeless tobacco: Not on file  . Alcohol Use: Yes    OB History    Grav Para Term Preterm Abortions TAB SAB Ect Mult Living                  Review of Systems  Constitutional: Negative.   HENT: Negative.   Respiratory: Negative.   Cardiovascular: Negative.   Gastrointestinal: Negative.   Musculoskeletal: Positive for arthralgias.  Skin: Negative.   Neurological: Negative.   Hematological: Negative.   Psychiatric/Behavioral: Negative.   All other systems reviewed and are negative.    Allergies  Review of patient's allergies indicates no known allergies.  Home Medications   Current Outpatient Rx  Name Route  Sig Dispense Refill  . ATORVASTATIN CALCIUM 20 MG PO TABS Oral Take 20 mg by mouth daily.    . CELECOXIB 200 MG PO CAPS Oral Take 200 mg by mouth daily.    Marland Kitchen KETOROLAC TROMETHAMINE 10 MG PO TABS Oral Take 10 mg by mouth every 6 (six) hours as needed.    Marland Kitchen PANTOPRAZOLE SODIUM 40 MG PO TBEC Oral Take 40 mg by mouth daily.    Marland Kitchen LYRICA PO Oral Take 150 mg by mouth.    . TRAMADOL HCL 50 MG PO TABS Oral Take 50 mg by mouth every 6 (six) hours as needed.    Marland Kitchen VALSARTAN-HYDROCHLOROTHIAZIDE 160-25 MG PO TABS Oral Take 1 tablet by mouth daily.      BP 149/87  Pulse 92  Temp(Src) 98.7 F (37.1 C) (Oral)  Resp 20  Ht 5\' 3"  (1.6 m)  Wt 218 lb (98.884 kg)  BMI 38.62 kg/m2  SpO2 100%  Physical Exam  Nursing note and vitals reviewed. Constitutional: She appears well-developed and well-nourished.  HENT:  Head: Normocephalic and atraumatic.  Eyes: Conjunctivae are normal. Pupils are equal, round, and reactive to light.  Neck: Neck supple. No tracheal deviation present. No thyromegaly present.  Cardiovascular: Normal rate and regular rhythm.   No murmur heard. Pulmonary/Chest: Effort normal and breath sounds normal.  Abdominal: Soft. Bowel sounds are normal. She exhibits no distension. There is no tenderness.       OBESe  Musculoskeletal: Normal range of motion. She exhibits no edema and no tenderness.       Right upper extremity full range of motion with slight pain on abduction. No redness no swelling no deformity neurovascularly intact; left upper extremity no redness no swelling no deformity neurovascular intact she is unable to abduct the shoulder due to pain. Both lower extremities without redness swelling or tenderness neurovascular intact  Neurological: She is alert. Coordination normal.  Skin: Skin is warm and dry. No rash noted.  Psychiatric: She has a normal mood and affect.    ED Course  Procedures (including critical care time)  Labs Reviewed - No data to display No results  found.   No diagnosis found.    MDM  Pain felt to be musculoskeletal in etiology Plan prescription hydrocodone-A. Pap Followup Dr. Simonne Come Diagnosis arthralgias bilateral shoulders        Doug Sou, MD 12/16/11 1502

## 2011-12-16 NOTE — Discharge Instructions (Signed)
Take the pain medicine as prescribed. An ice bag to painful areas may help to be used 30 minutes at a time 4 times daily. Call Dr.Aplington tomorrow for followup

## 2011-12-16 NOTE — ED Notes (Signed)
Pt states she was diagnosed with CTS Mar 7th and has been being treated for same. Pain has gradually moved up arm and across to other arm. Taking meds and doing therapy, but not helping. F/U appt May 9th. Needs something to help her get to that point.

## 2011-12-22 DIAGNOSIS — M25519 Pain in unspecified shoulder: Secondary | ICD-10-CM | POA: Diagnosis not present

## 2011-12-27 DIAGNOSIS — M25519 Pain in unspecified shoulder: Secondary | ICD-10-CM | POA: Diagnosis not present

## 2011-12-28 DIAGNOSIS — M25519 Pain in unspecified shoulder: Secondary | ICD-10-CM | POA: Diagnosis not present

## 2012-01-07 DIAGNOSIS — M25569 Pain in unspecified knee: Secondary | ICD-10-CM | POA: Diagnosis not present

## 2012-01-07 DIAGNOSIS — M545 Low back pain: Secondary | ICD-10-CM | POA: Diagnosis not present

## 2012-01-07 DIAGNOSIS — M5430 Sciatica, unspecified side: Secondary | ICD-10-CM | POA: Diagnosis not present

## 2012-01-07 DIAGNOSIS — M25519 Pain in unspecified shoulder: Secondary | ICD-10-CM | POA: Diagnosis not present

## 2012-01-28 DIAGNOSIS — M545 Low back pain: Secondary | ICD-10-CM | POA: Diagnosis not present

## 2012-01-30 ENCOUNTER — Emergency Department (HOSPITAL_COMMUNITY)
Admission: EM | Admit: 2012-01-30 | Discharge: 2012-01-30 | Disposition: A | Discharge status: Home / Self Care | Payer: Medicare Other | Attending: Emergency Medicine | Admitting: Emergency Medicine

## 2012-01-30 ENCOUNTER — Encounter (HOSPITAL_COMMUNITY): Payer: Self-pay | Admitting: *Deleted

## 2012-01-30 DIAGNOSIS — M2550 Pain in unspecified joint: Secondary | ICD-10-CM | POA: Insufficient documentation

## 2012-01-30 DIAGNOSIS — G5600 Carpal tunnel syndrome, unspecified upper limb: Secondary | ICD-10-CM | POA: Diagnosis not present

## 2012-01-30 DIAGNOSIS — E7800 Pure hypercholesterolemia, unspecified: Secondary | ICD-10-CM | POA: Diagnosis not present

## 2012-01-30 DIAGNOSIS — I10 Essential (primary) hypertension: Secondary | ICD-10-CM | POA: Diagnosis not present

## 2012-01-30 DIAGNOSIS — M129 Arthropathy, unspecified: Secondary | ICD-10-CM | POA: Diagnosis not present

## 2012-01-30 DIAGNOSIS — M542 Cervicalgia: Secondary | ICD-10-CM | POA: Diagnosis not present

## 2012-01-30 DIAGNOSIS — Z87891 Personal history of nicotine dependence: Secondary | ICD-10-CM | POA: Insufficient documentation

## 2012-01-30 DIAGNOSIS — M79609 Pain in unspecified limb: Secondary | ICD-10-CM | POA: Diagnosis not present

## 2012-01-30 LAB — BASIC METABOLIC PANEL
BUN: 16 mg/dL (ref 6–23)
Creatinine, Ser: 0.68 mg/dL (ref 0.50–1.10)
GFR calc Af Amer: >90 mL/min (ref >90)
GFR calc non Af Amer: 85 mL/min — ABNORMAL LOW (ref >90)
Potassium: 3.3 mEq/L — ABNORMAL LOW (ref 3.5–5.1)

## 2012-01-30 LAB — CBC
HCT: 34.9 % — ABNORMAL LOW (ref 36.0–46.0)
MCHC: 33.2 g/dL (ref 30.0–36.0)
MCV: 89.5 fL (ref 78.0–100.0)
RDW: 14.8 % (ref 11.5–15.5)

## 2012-01-30 MED ORDER — MORPHINE SULFATE 4 MG/ML IJ SOLN
4.0000 mg | INTRAMUSCULAR | Status: DC | PRN
  Administered 2012-01-30: 4 mg via INTRAVENOUS
  Filled 2012-01-30: qty 1

## 2012-01-30 MED ORDER — DOCUSATE SODIUM 100 MG PO CAPS: 100.0000 mg | ORAL_CAPSULE | Freq: Two times a day (BID) | ORAL | Status: AC

## 2012-01-30 MED ORDER — ONDANSETRON HCL 4 MG/2ML IJ SOLN
4.0000 mg | Freq: Once | INTRAMUSCULAR | Status: AC
  Administered 2012-01-30: 4 mg via INTRAVENOUS
  Filled 2012-01-30: qty 2

## 2012-01-30 MED ORDER — OXYCODONE-ACETAMINOPHEN 5-325 MG PO TABS: 1.0000 | ORAL_TABLET | Freq: Four times a day (QID) | ORAL | Status: AC | PRN

## 2012-01-30 NOTE — ED Notes (Signed)
Pt c/ojoint pain, has been told she has arthritis, has difficulty with ADL's, has been on Prednisone and Oxycodone in the past which helped

## 2012-01-30 NOTE — ED Provider Notes (Signed)
History     CSN: [id number]  Arrival date & time 01/30/12  1209   First MD Initiated Contact with Patient 01/30/12 1228      Chief Complaint  Patient presents with  . Joint Pain    HPI Pt states she has been aching and pain in all of her joints in her body.  She has pain even getting in and out of her bed.  She has trouble with pain in her neck.  She feels like every joint is involved.  This has been ongoing since March but it is increasing in severity. Pt has seen an orthopedic doctor and has had xrays.  One doctor recommended surgery.  Another doctor did an injection into her shoulder.  Pt also took hydrocortisone and prednisone which helped her shoulders but she feels like her lower extremity is worse. The patient has followup appointments with her doctors this week but the pain was too severe today so she came to the emergency room. Patient is frustrated somewhat by the fact that this has been getting worse and she still does not have a definitive diagnosis. Past Medical History  Diagnosis Date  . CTS (carpal tunnel syndrome)   . Hypertension   . Hypercholesteremia   . Arthritis     Past Surgical History  Procedure Date  . Abdominal hysterectomy   . Knee surgery   . Breast surgery   . Back surgery     No family history on file.  History  Substance Use Topics  . Smoking status: Former Games developer  . Smokeless tobacco: Not on file  . Alcohol Use: Yes    OB History    Grav Para Term Preterm Abortions TAB SAB Ect Mult Living                  Review of Systems  All other systems reviewed and are negative.    Allergies  Review of patient's allergies indicates no known allergies.  Home Medications   Current Outpatient Rx  Name Route Sig Dispense Refill  . ATORVASTATIN CALCIUM 20 MG PO TABS Oral Take 20 mg by mouth daily.    . CELECOXIB 200 MG PO CAPS Oral Take 200 mg by mouth daily.    Marland Kitchen KETOROLAC TROMETHAMINE 10 MG PO TABS Oral Take 10 mg by mouth every 6 (six)  hours as needed.    Marland Kitchen PANTOPRAZOLE SODIUM 40 MG PO TBEC Oral Take 40 mg by mouth daily.    Marland Kitchen LYRICA PO Oral Take 150 mg by mouth.    . TRAMADOL HCL 50 MG PO TABS Oral Take 50 mg by mouth every 6 (six) hours as needed.    Marland Kitchen VALSARTAN-HYDROCHLOROTHIAZIDE 160-25 MG PO TABS Oral Take 1 tablet by mouth daily.      BP 110/50  Pulse 102  Temp 99.1 F (37.3 C)  SpO2 96%  Physical Exam  Nursing note and vitals reviewed. Constitutional: She appears well-developed and well-nourished. No distress.  HENT:  Head: Normocephalic and atraumatic.  Right Ear: External ear normal.  Left Ear: External ear normal.  Eyes: Conjunctivae are normal. Right eye exhibits no discharge. Left eye exhibits no discharge. No scleral icterus.  Neck: Neck supple. No tracheal deviation present.  Cardiovascular: Normal rate, regular rhythm and intact distal pulses.   Pulmonary/Chest: Effort normal and breath sounds normal. No stridor. No respiratory distress. She has no wheezes. She has no rales.  Abdominal: Soft. Bowel sounds are normal. She exhibits no distension. There is no tenderness.  There is no rebound and no guarding.  Musculoskeletal: She exhibits no edema and no tenderness.       Right shoulder: She exhibits decreased range of motion and tenderness. She exhibits no swelling and no effusion.       Left shoulder: She exhibits decreased range of motion and tenderness. She exhibits no swelling and no effusion.       Right elbow: She exhibits no swelling and no effusion.       Left elbow: She exhibits no swelling and no effusion.       Right wrist: She exhibits no swelling and no effusion.       Left wrist: She exhibits no swelling and no effusion.       Right knee: She exhibits no swelling and no effusion.       Left knee: She exhibits no effusion and no ecchymosis.       Right ankle: She exhibits no swelling.       Left ankle: She exhibits no swelling.  Neurological: She is alert. She has normal strength. No  sensory deficit. Cranial nerve deficit:  no gross defecits noted. She exhibits normal muscle tone. She displays no seizure activity. Coordination normal.  Skin: Skin is warm and dry. No rash noted.  Psychiatric: She has a normal mood and affect.    ED Course  Procedures (including critical care time)  Labs Reviewed  CBC - Abnormal; Notable for the following:    WBC 10.7 (*)     Hemoglobin 11.6 (*)     HCT 34.9 (*)     Platelets 411 (*)     All other components within normal limits  BASIC METABOLIC PANEL - Abnormal; Notable for the following:    Sodium 134 (*)     Potassium 3.3 (*)     Glucose, Bld 175 (*)     GFR calc non Af Amer 85 (*)     All other components within normal limits  CK   No results found.    MDM  The patient has been having ongoing joint pains for several months. On exam she has no areas of focal effusion or erythema. Symptoms are not suggestive of an acute infection. Her CK is normal and there is no evidence to suggest rhabdomyolysis.Marland Kitchen  patient has been treated with IV pain medications. Sure he has plans for outpatient appointments this week for further and is deviation of this condition.  At this time there does not appear to be any evidence of an acute emergency medical condition and the patient appears stable for discharge with appropriate outpatient follow up.         Celene Kras, MD 01/30/12 1426

## 2012-02-04 DIAGNOSIS — M545 Low back pain: Secondary | ICD-10-CM | POA: Diagnosis not present

## 2012-02-10 DIAGNOSIS — M5430 Sciatica, unspecified side: Secondary | ICD-10-CM | POA: Diagnosis not present

## 2012-02-10 DIAGNOSIS — M25569 Pain in unspecified knee: Secondary | ICD-10-CM | POA: Diagnosis not present

## 2012-02-10 DIAGNOSIS — M545 Low back pain: Secondary | ICD-10-CM | POA: Diagnosis not present

## 2012-02-18 ENCOUNTER — Other Ambulatory Visit: Payer: Self-pay | Admitting: Nephrology

## 2012-02-19 DIAGNOSIS — R5381 Other malaise: Secondary | ICD-10-CM | POA: Diagnosis not present

## 2012-02-19 DIAGNOSIS — M1990 Unspecified osteoarthritis, unspecified site: Secondary | ICD-10-CM | POA: Diagnosis not present

## 2012-02-19 DIAGNOSIS — R634 Abnormal weight loss: Secondary | ICD-10-CM | POA: Diagnosis not present

## 2012-02-19 DIAGNOSIS — R5383 Other fatigue: Secondary | ICD-10-CM | POA: Diagnosis not present

## 2012-02-19 DIAGNOSIS — M2550 Pain in unspecified joint: Secondary | ICD-10-CM | POA: Diagnosis not present

## 2012-02-19 DIAGNOSIS — M064 Inflammatory polyarthropathy: Secondary | ICD-10-CM | POA: Diagnosis not present

## 2012-02-19 DIAGNOSIS — M353 Polymyalgia rheumatica: Secondary | ICD-10-CM | POA: Diagnosis not present

## 2012-03-12 DIAGNOSIS — M5430 Sciatica, unspecified side: Secondary | ICD-10-CM | POA: Diagnosis not present

## 2012-03-12 DIAGNOSIS — M25569 Pain in unspecified knee: Secondary | ICD-10-CM | POA: Diagnosis not present

## 2012-03-12 DIAGNOSIS — M545 Low back pain: Secondary | ICD-10-CM | POA: Diagnosis not present

## 2012-03-27 DIAGNOSIS — M069 Rheumatoid arthritis, unspecified: Secondary | ICD-10-CM | POA: Diagnosis not present

## 2012-03-27 DIAGNOSIS — M2550 Pain in unspecified joint: Secondary | ICD-10-CM | POA: Diagnosis not present

## 2012-03-27 DIAGNOSIS — Z1589 Genetic susceptibility to other disease: Secondary | ICD-10-CM | POA: Diagnosis not present

## 2012-04-08 DIAGNOSIS — M069 Rheumatoid arthritis, unspecified: Secondary | ICD-10-CM | POA: Diagnosis not present

## 2012-04-08 DIAGNOSIS — Z1589 Genetic susceptibility to other disease: Secondary | ICD-10-CM | POA: Diagnosis not present

## 2012-04-08 DIAGNOSIS — M2550 Pain in unspecified joint: Secondary | ICD-10-CM | POA: Diagnosis not present

## 2012-04-08 DIAGNOSIS — M1990 Unspecified osteoarthritis, unspecified site: Secondary | ICD-10-CM | POA: Diagnosis not present

## 2012-04-08 DIAGNOSIS — Z79899 Other long term (current) drug therapy: Secondary | ICD-10-CM | POA: Diagnosis not present

## 2012-04-16 DIAGNOSIS — M25569 Pain in unspecified knee: Secondary | ICD-10-CM | POA: Diagnosis not present

## 2012-04-16 DIAGNOSIS — M5430 Sciatica, unspecified side: Secondary | ICD-10-CM | POA: Diagnosis not present

## 2012-04-16 DIAGNOSIS — M545 Low back pain: Secondary | ICD-10-CM | POA: Diagnosis not present

## 2012-04-22 DIAGNOSIS — M069 Rheumatoid arthritis, unspecified: Secondary | ICD-10-CM | POA: Diagnosis not present

## 2012-05-22 DIAGNOSIS — M2550 Pain in unspecified joint: Secondary | ICD-10-CM | POA: Diagnosis not present

## 2012-05-22 DIAGNOSIS — M069 Rheumatoid arthritis, unspecified: Secondary | ICD-10-CM | POA: Diagnosis not present

## 2012-05-22 DIAGNOSIS — M1990 Unspecified osteoarthritis, unspecified site: Secondary | ICD-10-CM | POA: Diagnosis not present

## 2012-05-22 DIAGNOSIS — Z1589 Genetic susceptibility to other disease: Secondary | ICD-10-CM | POA: Diagnosis not present

## 2012-05-22 DIAGNOSIS — Z79899 Other long term (current) drug therapy: Secondary | ICD-10-CM | POA: Diagnosis not present

## 2012-05-22 DIAGNOSIS — Z23 Encounter for immunization: Secondary | ICD-10-CM | POA: Diagnosis not present

## 2012-06-10 DIAGNOSIS — H26019 Infantile and juvenile cortical, lamellar, or zonular cataract, unspecified eye: Secondary | ICD-10-CM | POA: Diagnosis not present

## 2012-06-10 DIAGNOSIS — H35039 Hypertensive retinopathy, unspecified eye: Secondary | ICD-10-CM | POA: Diagnosis not present

## 2012-06-10 DIAGNOSIS — H04129 Dry eye syndrome of unspecified lacrimal gland: Secondary | ICD-10-CM | POA: Diagnosis not present

## 2012-07-03 DIAGNOSIS — H26019 Infantile and juvenile cortical, lamellar, or zonular cataract, unspecified eye: Secondary | ICD-10-CM | POA: Diagnosis not present

## 2012-07-03 DIAGNOSIS — H35039 Hypertensive retinopathy, unspecified eye: Secondary | ICD-10-CM | POA: Diagnosis not present

## 2012-07-03 DIAGNOSIS — H04129 Dry eye syndrome of unspecified lacrimal gland: Secondary | ICD-10-CM | POA: Diagnosis not present

## 2012-07-24 DIAGNOSIS — Z1589 Genetic susceptibility to other disease: Secondary | ICD-10-CM | POA: Diagnosis not present

## 2012-07-24 DIAGNOSIS — M2550 Pain in unspecified joint: Secondary | ICD-10-CM | POA: Diagnosis not present

## 2012-07-24 DIAGNOSIS — Z79899 Other long term (current) drug therapy: Secondary | ICD-10-CM | POA: Diagnosis not present

## 2012-07-24 DIAGNOSIS — M069 Rheumatoid arthritis, unspecified: Secondary | ICD-10-CM | POA: Diagnosis not present

## 2012-07-24 DIAGNOSIS — M1990 Unspecified osteoarthritis, unspecified site: Secondary | ICD-10-CM | POA: Diagnosis not present

## 2012-07-25 ENCOUNTER — Other Ambulatory Visit: Payer: Self-pay | Admitting: Ophthalmology

## 2012-07-25 MED ORDER — TETRACAINE HCL 0.5 % OP SOLN: 1.0000 [drp] | OPHTHALMIC | Status: DC

## 2012-07-25 NOTE — H&P (Signed)
History & Physical:   DATE:   07-03-12  NAME:  Joy [name], Joy [name]     [id number]       HISTORY OF PRESENT ILLNESS: Chief Eye Complaints blurry vision    patient  notes difficulty seeing Tv & daily activites      HPI: EYES: Reports symptoms of     LOCATION:      QUALITY/COURSE:   Reports condition is   INTENSITY/SEVERITY:    Reports measurement ( or degree) as   DURATION:   Reports the general length of symptoms to be   ONSET/TIMING:   Reports occurrence as   CONTEXT/WHEN:   Reports usually associated with   MODIFIERS/TREATMENTS:  Improved by              ROS:   GEN- Constitutional: HENT: GEN - Endocrine: Reports symptoms of LUNGS/Respiratory:  HEART/Cardiovascular: Reports symptoms of hypertension.    ABD/Gastrointestinal:   Musculoskeletal (BJE): +++++      osteoarthritis/osteoarthrosis   NEURO/Neurological: PSYCH/Psychiatric:    Is the pt oriented to time, place, person?  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    Rheumatoid arthritis   ICD#714.0  SURGERIES: spinal stenosis sx lower back 1995 Rt Knee replacement   1997 Breast Reduction sx  2011 Pick List - Surgeries  MEDICATIONS: Asprin study  Prednisone (Deltasone, Orasone): 10 mg (tablet)  SIG-  1 each   once a day or as directed     Lyrica (Pregabalin):   50 mg (capsule)  SIG-  1 cap(s)   3 times a day    Methotrexate:   2.5 mg (tablet)  SIG-  1 tab(s)  to 3 as directed    as directed     Lipitor (Atorvastatin):   40 mg (tablet)  SIG-  1 tab(s)   once a day (at bedtime)    Diovan HCT (Valsartan/HCTZ):        12.5 mg-160 mg (tablet) 80/12mg    SIG-  1 each   once a day    Pantoprazole: Strength-  SIG-  Dose-  Freq-     Advair Diskus (Fluticasone/Salmeterol):   100 mcg-50 mcg (powder)  SIG-  1 inhalation   every 12 hours    Multi-Vitamins with  Minerals:   Multiple Vitamins with Minerals (tablet)  SIG-  1 -   once a day   i.e. Theragran-M and similar   Fish Oil:   - (capsule) 1000 mg   SIG-  1 -    Once daily  REVIEW OF SYSTEMS: not found  TOBACCO: Former Smoker. YNW#G95.62      Pick List - Tobacco - Summary  SOCIAL HISTORY: Single.  Retired  FAMILY HISTORY: Positive family history for  -   Diabetes/HTN/ Negative family history for  -   PARENTS: CHILDREN: GRANDPARENTS: SIBLINGS:  Sisters Diabetes UNCLES/AUNTS: OTHERS/DISTANT: Nephew , Niece  Diabetes  ALLERGIES: Adhesive tape Allergic contact dermatitis to latex   ICD#V15.07   Drug Allergies.  No Known.    Starter - Allergies - Summary:  PHYSICAL EXAMINATION: Exam: GENERAL: Appearance: General appearance can be described as well-nourished, well-developed, and in no acute distress.         Va     OD:cc 20/40  ph  NI OS:cc 20/30  ph NI  EYEGLASSES:  OD  -2.75 + 0.50 x 064                                             OS   -3.75 + 1.50 x 101 ADD:   +2.00  MR  OD-1.75 + 0.75 x 174  20/40 OS -3.00 + 0.50 x 115  20/25 ADD   +2.50  VF:  OD                                               OS  PUPILS: 3mm OU  EYELIDS & OCULAR ADNEXA normal  SLE: Conjunctiva:trace injection  Cornea:Arcus   Decreased Tear Break-Up Time + 1 staining OU    Anterior Chamber:Deep and Quiet    Iris: Brown  Lens: Cortical Cataract    Vitreous  CCT  Ta   in mmHg: OD:17             OS:17 Time:06/10/2012 11:35   Gonio:   Dilation:  Fundus:  optic nerve OD:35% cupping                                                   OS35%   Macula:       ZO:XWRU EA:VWUJW  Vessels:normal  Periphery:  BP: 110/68 Pulse:72 Resp: 16   Exam: GENERAL: Appearance: General appearance can be described as well-nourished, well-developed, and in no acute distress.    LYMPHATIC: HEAD, EARS, NOSE AND THROAT: Ears-Nose (external) Inspection: Externally, nose and ears are  normal in appearance and without scars, lesions, or nodules.      Otoscopic Exam: External auditory canals and tympanic membranes are normal.      Hearing assessment shows no problems with normal conversation.    Nose exam, internally, reveals nasal mucosa, septum and turbinates are unremarkable.    Teeth, Gingiva, and Lip Exams: No lesions or evidence of infection.      Oropharynx demonstrates oral mucosa, salivary glands, tongue, tonsils, posterior pharynx, hard-soft palates are normal.  EYES: see above  NECK: Neck tissue exam demonstrates no masses, symmetrical, and trachea is midline.      LUNGS and RESPIRATORY: Lung auscultation elicits no wheezing, rhonci, rales or rubs and with equal breath sounds.    Respiratory effort described as breathing is unlabored and chest movement is symmetrical.    HEART (Cardiovascular): Heart auscultation discovers regular rate and rhythm; no murmur, gallop or rub. Normal heart sounds.    ABDOMEN (Gastrointestinal): Mass/Tenderness Exam: Neither are present.     Liver/Spleen: No hepatomegaly or splenomegaly.   MUSCULOSKELETAL (BJE): Inspection-Palpation: No major bone, joint, tendon, or muscle changes.      NEUROLOGICAL: Alert and oriented. No major deficits of coordination or sensation.      PSYCHIATRIC: Insight and judgment appear  both to be intact and appropriate.    Mood and affect are described as normal mood and full affect.    SKIN: Skin Inspection: No rashes or lesions.  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   Actions: :  Lab/Tests:  per anesthesia    ___________________________ Chalmers Guest, Montez Hageman Starter -  Inactive Problems:

## 2012-07-28 ENCOUNTER — Encounter (HOSPITAL_COMMUNITY)
Admission: RE | Admit: 2012-07-28 | Discharge: 2012-07-28 | Disposition: A | Discharge status: Home / Self Care | Payer: Medicare Other | Source: Ambulatory Visit | Attending: Ophthalmology | Admitting: Ophthalmology

## 2012-07-28 ENCOUNTER — Encounter (HOSPITAL_COMMUNITY): Payer: Self-pay

## 2012-07-28 ENCOUNTER — Encounter (HOSPITAL_COMMUNITY): Payer: Self-pay | Admitting: Pharmacy Technician

## 2012-07-28 DIAGNOSIS — M3500 Sicca syndrome, unspecified: Secondary | ICD-10-CM | POA: Diagnosis not present

## 2012-07-28 DIAGNOSIS — H04129 Dry eye syndrome of unspecified lacrimal gland: Secondary | ICD-10-CM | POA: Diagnosis not present

## 2012-07-28 DIAGNOSIS — H26019 Infantile and juvenile cortical, lamellar, or zonular cataract, unspecified eye: Secondary | ICD-10-CM | POA: Diagnosis not present

## 2012-07-28 DIAGNOSIS — I10 Essential (primary) hypertension: Secondary | ICD-10-CM | POA: Diagnosis not present

## 2012-07-28 DIAGNOSIS — Z833 Family history of diabetes mellitus: Secondary | ICD-10-CM | POA: Diagnosis not present

## 2012-07-28 DIAGNOSIS — Z8249 Family history of ischemic heart disease and other diseases of the circulatory system: Secondary | ICD-10-CM | POA: Diagnosis not present

## 2012-07-28 DIAGNOSIS — Z96659 Presence of unspecified artificial knee joint: Secondary | ICD-10-CM | POA: Diagnosis not present

## 2012-07-28 DIAGNOSIS — M069 Rheumatoid arthritis, unspecified: Secondary | ICD-10-CM | POA: Diagnosis not present

## 2012-07-28 DIAGNOSIS — M1990 Unspecified osteoarthritis, unspecified site: Secondary | ICD-10-CM | POA: Diagnosis not present

## 2012-07-28 DIAGNOSIS — Z01812 Encounter for preprocedural laboratory examination: Secondary | ICD-10-CM | POA: Diagnosis not present

## 2012-07-28 DIAGNOSIS — Z01810 Encounter for preprocedural cardiovascular examination: Secondary | ICD-10-CM | POA: Diagnosis not present

## 2012-07-28 LAB — CBC
MCH: 31.4 pg (ref 26.0–34.0)
Platelets: 284 10*3/uL (ref 150–400)
RBC: 4.04 MIL/uL (ref 3.87–5.11)
RDW: 16.4 % — ABNORMAL HIGH (ref 11.5–15.5)
WBC: 10.6 10*3/uL — ABNORMAL HIGH (ref 4.0–10.5)

## 2012-07-28 LAB — BASIC METABOLIC PANEL
Calcium: 10.3 mg/dL (ref 8.4–10.5)
Creatinine, Ser: 0.84 mg/dL (ref 0.50–1.10)
GFR calc non Af Amer: 67 mL/min — ABNORMAL LOW (ref >90)
Sodium: 140 mEq/L (ref 135–145)

## 2012-07-28 NOTE — Pre-Procedure Instructions (Signed)
20 ADASYN MCADAMS  07/28/2012   Your procedure is scheduled on: Friday, December 13th   Report to [facility] at 5:30 AM.  Call this number if you have problems the morning of surgery: [phone]   Remember:   Do not eat food or drink any liquids:After Midnight Thursday.    Take these medicines the morning of surgery with A SIP OF WATER: Protonix, Pain pill.   Do not wear jewelry, make-up or nail polish.  Do not wear lotions, powders, or perfumes. You may NOT wear deodorant.             Ladies --Do not shave 48 hours prior to surgery.                       Do not bring valuables to the hospital.  Contacts, dentures or bridgework may not be worn into surgery.   Leave suitcase in the car. After surgery it may be brought to your room.  For patients admitted to the hospital, checkout time is 11:00 AM the day of discharge.   Patients discharged the day of surgery will not be allowed to drive home.  Name and phone number of your driver:    Special Instructions: Shower using CHG 2 nights before surgery and the night before surgery.  If you shower the day of surgery use CHG.  Use special wash - you have one bottle of CHG for all showers.  You should use approximately 1/3 of the bottle for each shower.   Please read over the following fact sheets that you were given: Pain Booklet and Surgical Site Infection Prevention

## 2012-07-29 ENCOUNTER — Other Ambulatory Visit: Payer: Self-pay | Admitting: Ophthalmology

## 2012-07-31 DIAGNOSIS — H35039 Hypertensive retinopathy, unspecified eye: Secondary | ICD-10-CM | POA: Diagnosis not present

## 2012-07-31 DIAGNOSIS — H04129 Dry eye syndrome of unspecified lacrimal gland: Secondary | ICD-10-CM | POA: Diagnosis not present

## 2012-07-31 DIAGNOSIS — H2510 Age-related nuclear cataract, unspecified eye: Secondary | ICD-10-CM | POA: Diagnosis not present

## 2012-07-31 MED ORDER — TROPICAMIDE 1 % OP SOLN
1.0000 [drp] | OPHTHALMIC | Status: AC
  Administered 2012-08-01 (×3): 1 [drp] via OPHTHALMIC
  Filled 2012-07-31: qty 3

## 2012-07-31 MED ORDER — GATIFLOXACIN 0.5 % OP SOLN
1.0000 [drp] | OPHTHALMIC | Status: DC
  Administered 2012-08-01 (×2): 1 [drp] via OPHTHALMIC

## 2012-07-31 MED ORDER — CYCLOPENTOLATE HCL 1 % OP SOLN: 1.0000 [drp] | OPHTHALMIC | Status: DC

## 2012-07-31 MED ORDER — PHENYLEPHRINE HCL 2.5 % OP SOLN: 1.0000 [drp] | OPHTHALMIC | Status: DC

## 2012-07-31 MED ORDER — KETOROLAC TROMETHAMINE 0.5 % OP SOLN: 1.0000 [drp] | OPHTHALMIC | Status: DC

## 2012-08-01 ENCOUNTER — Encounter (HOSPITAL_COMMUNITY): Payer: Self-pay | Admitting: *Deleted

## 2012-08-01 ENCOUNTER — Encounter (HOSPITAL_COMMUNITY): Payer: Self-pay | Admitting: Certified Registered"

## 2012-08-01 ENCOUNTER — Ambulatory Visit (HOSPITAL_COMMUNITY)
Admission: RE | Admit: 2012-08-01 | Discharge: 2012-08-01 | Disposition: A | Discharge status: Home / Self Care | Payer: Medicare Other | Source: Ambulatory Visit | Attending: Ophthalmology | Admitting: Ophthalmology

## 2012-08-01 ENCOUNTER — Encounter (HOSPITAL_COMMUNITY)
Admission: RE | Disposition: A | Discharge status: Home / Self Care | Payer: Self-pay | Source: Ambulatory Visit | Attending: Ophthalmology

## 2012-08-01 ENCOUNTER — Ambulatory Visit (HOSPITAL_COMMUNITY): Payer: Medicare Other | Admitting: Certified Registered"

## 2012-08-01 DIAGNOSIS — M069 Rheumatoid arthritis, unspecified: Secondary | ICD-10-CM | POA: Diagnosis not present

## 2012-08-01 DIAGNOSIS — Z8249 Family history of ischemic heart disease and other diseases of the circulatory system: Secondary | ICD-10-CM | POA: Insufficient documentation

## 2012-08-01 DIAGNOSIS — M3500 Sicca syndrome, unspecified: Secondary | ICD-10-CM | POA: Insufficient documentation

## 2012-08-01 DIAGNOSIS — Z01810 Encounter for preprocedural cardiovascular examination: Secondary | ICD-10-CM | POA: Insufficient documentation

## 2012-08-01 DIAGNOSIS — Z96659 Presence of unspecified artificial knee joint: Secondary | ICD-10-CM | POA: Insufficient documentation

## 2012-08-01 DIAGNOSIS — I10 Essential (primary) hypertension: Secondary | ICD-10-CM | POA: Insufficient documentation

## 2012-08-01 DIAGNOSIS — H04129 Dry eye syndrome of unspecified lacrimal gland: Secondary | ICD-10-CM | POA: Diagnosis not present

## 2012-08-01 DIAGNOSIS — H26019 Infantile and juvenile cortical, lamellar, or zonular cataract, unspecified eye: Secondary | ICD-10-CM | POA: Diagnosis not present

## 2012-08-01 DIAGNOSIS — Z01812 Encounter for preprocedural laboratory examination: Secondary | ICD-10-CM | POA: Insufficient documentation

## 2012-08-01 DIAGNOSIS — G5600 Carpal tunnel syndrome, unspecified upper limb: Secondary | ICD-10-CM | POA: Diagnosis not present

## 2012-08-01 DIAGNOSIS — Z833 Family history of diabetes mellitus: Secondary | ICD-10-CM | POA: Insufficient documentation

## 2012-08-01 DIAGNOSIS — M1990 Unspecified osteoarthritis, unspecified site: Secondary | ICD-10-CM | POA: Insufficient documentation

## 2012-08-01 HISTORY — PX: CATARACT EXTRACTION W/PHACO: SHX586

## 2012-08-01 SURGERY — PHACOEMULSIFICATION, CATARACT, WITH IOL INSERTION
Anesthesia: Monitor Anesthesia Care | Laterality: Right

## 2012-08-01 SURGERY — PHACOEMULSIFICATION, CATARACT, WITH IOL INSERTION
Anesthesia: Monitor Anesthesia Care | Site: Eye | Laterality: Right | Wound class: Clean

## 2012-08-01 MED ORDER — TOBRAMYCIN-DEXAMETHASONE 0.3-0.1 % OP OINT
TOPICAL_OINTMENT | OPHTHALMIC | Status: AC
  Filled 2012-08-01: qty 3.5

## 2012-08-01 MED ORDER — DEXAMETHASONE SODIUM PHOSPHATE 10 MG/ML IJ SOLN
INTRAMUSCULAR | Status: AC
  Filled 2012-08-01: qty 1

## 2012-08-01 MED ORDER — GENTAMICIN SULFATE 40 MG/ML IJ SOLN
INTRAMUSCULAR | Status: AC
  Filled 2012-08-01: qty 2

## 2012-08-01 MED ORDER — BSS IO SOLN
INTRAOCULAR | Status: AC
  Filled 2012-08-01: qty 500

## 2012-08-01 MED ORDER — NA CHONDROIT SULF-NA HYALURON 40-30 MG/ML IO SOLN
INTRAOCULAR | Status: DC | PRN
  Administered 2012-08-01: 0.5 mL via INTRAOCULAR

## 2012-08-01 MED ORDER — LIDOCAINE-EPINEPHRINE 2 %-1:100000 IJ SOLN
INTRAMUSCULAR | Status: AC
  Filled 2012-08-01: qty 1

## 2012-08-01 MED ORDER — ACETYLCHOLINE CHLORIDE 1:100 IO SOLR
INTRAOCULAR | Status: DC | PRN
  Administered 2012-08-01: 20 mg via INTRAOCULAR

## 2012-08-01 MED ORDER — CYCLOPENTOLATE HCL 1 % OP SOLN
1.0000 [drp] | OPHTHALMIC | Status: AC
  Administered 2012-08-01 (×2): 1 [drp] via OPHTHALMIC

## 2012-08-01 MED ORDER — 0.9 % SODIUM CHLORIDE (POUR BTL) OPTIME
TOPICAL | Status: DC | PRN
  Administered 2012-08-01: 1000 mL

## 2012-08-01 MED ORDER — KETOROLAC TROMETHAMINE 0.5 % OP SOLN
OPHTHALMIC | Status: AC
  Administered 2012-08-01: 1 [drp] via OPHTHALMIC
  Filled 2012-08-01: qty 5

## 2012-08-01 MED ORDER — HYALURONIDASE HUMAN 150 UNIT/ML IJ SOLN
INTRAMUSCULAR | Status: DC | PRN
  Administered 2012-08-01: 150 [IU] via SUBCUTANEOUS

## 2012-08-01 MED ORDER — BUPIVACAINE HCL (PF) 0.75 % IJ SOLN
INTRAMUSCULAR | Status: DC | PRN
  Administered 2012-08-01: 10 mL

## 2012-08-01 MED ORDER — LIDOCAINE HCL 2 % IJ SOLN
INTRAMUSCULAR | Status: AC
  Filled 2012-08-01: qty 20

## 2012-08-01 MED ORDER — EPINEPHRINE HCL 1 MG/ML IJ SOLN
INTRAMUSCULAR | Status: AC
  Filled 2012-08-01: qty 1

## 2012-08-01 MED ORDER — BUPIVACAINE HCL (PF) 0.75 % IJ SOLN
INTRAMUSCULAR | Status: AC
  Filled 2012-08-01: qty 10

## 2012-08-01 MED ORDER — SODIUM HYALURONATE 10 MG/ML IO SOLN
INTRAOCULAR | Status: AC
  Filled 2012-08-01: qty 0.85

## 2012-08-01 MED ORDER — CYCLOPENTOLATE HCL 1 % OP SOLN
OPHTHALMIC | Status: AC
  Administered 2012-08-01: 1 [drp] via OPHTHALMIC
  Filled 2012-08-01: qty 2

## 2012-08-01 MED ORDER — PROVISC 10 MG/ML IO SOLN
INTRAOCULAR | Status: DC | PRN
  Administered 2012-08-01: .85 mL via INTRAOCULAR

## 2012-08-01 MED ORDER — TETRACAINE HCL 0.5 % OP SOLN
OPHTHALMIC | Status: DC | PRN
  Administered 2012-08-01: 2 [drp] via OPHTHALMIC

## 2012-08-01 MED ORDER — LACTATED RINGERS IV SOLN
INTRAVENOUS | Status: DC | PRN
  Administered 2012-08-01: 07:00:00 via INTRAVENOUS

## 2012-08-01 MED ORDER — NA CHONDROIT SULF-NA HYALURON 40-30 MG/ML IO SOLN
INTRAOCULAR | Status: AC
  Filled 2012-08-01: qty 0.5

## 2012-08-01 MED ORDER — LIDOCAINE-EPINEPHRINE 2 %-1:100000 IJ SOLN
INTRAMUSCULAR | Status: DC | PRN
  Administered 2012-08-01: 20 mL

## 2012-08-01 MED ORDER — STERILE WATER FOR IRRIGATION IR SOLN
Status: DC | PRN
  Administered 2012-08-01: 1000 mL

## 2012-08-01 MED ORDER — EPINEPHRINE HCL 1 MG/ML IJ SOLN
INTRAOCULAR | Status: DC | PRN
  Administered 2012-08-01: 07:00:00

## 2012-08-01 MED ORDER — MIDAZOLAM HCL 5 MG/5ML IJ SOLN
INTRAMUSCULAR | Status: DC | PRN
  Administered 2012-08-01: 2 mg via INTRAVENOUS

## 2012-08-01 MED ORDER — KETOROLAC TROMETHAMINE 0.5 % OP SOLN
1.0000 [drp] | OPHTHALMIC | Status: AC
  Administered 2012-08-01 (×2): 1 [drp] via OPHTHALMIC

## 2012-08-01 MED ORDER — ACETYLCHOLINE CHLORIDE 1:100 IO SOLR
INTRAOCULAR | Status: AC
  Filled 2012-08-01: qty 1

## 2012-08-01 MED ORDER — GATIFLOXACIN 0.5 % OP SOLN
OPHTHALMIC | Status: AC
  Administered 2012-08-01: 1 [drp] via OPHTHALMIC
  Filled 2012-08-01: qty 2.5

## 2012-08-01 MED ORDER — HYALURONIDASE HUMAN 150 UNIT/ML IJ SOLN
INTRAMUSCULAR | Status: AC
  Filled 2012-08-01: qty 1

## 2012-08-01 MED ORDER — BALANCED SALT IO SOLN: INTRAOCULAR | Status: DC | PRN

## 2012-08-01 MED ORDER — BSS IO SOLN
INTRAOCULAR | Status: AC
  Filled 2012-08-01: qty 15

## 2012-08-01 MED ORDER — LIDOCAINE HCL (CARDIAC) 20 MG/ML IV SOLN
INTRAVENOUS | Status: DC | PRN
  Administered 2012-08-01: 80 mg via INTRAVENOUS

## 2012-08-01 MED ORDER — PHENYLEPHRINE HCL 2.5 % OP SOLN
1.0000 [drp] | OPHTHALMIC | Status: AC
  Administered 2012-08-01 (×2): 1 [drp] via OPHTHALMIC

## 2012-08-01 MED ORDER — TETRACAINE HCL 0.5 % OP SOLN
OPHTHALMIC | Status: AC
  Filled 2012-08-01: qty 2

## 2012-08-01 MED ORDER — TOBRAMYCIN 0.3 % OP OINT
TOPICAL_OINTMENT | OPHTHALMIC | Status: DC | PRN
  Administered 2012-08-01: 1 via OPHTHALMIC

## 2012-08-01 MED ORDER — PHENYLEPHRINE HCL 2.5 % OP SOLN
OPHTHALMIC | Status: AC
  Administered 2012-08-01: 1 [drp] via OPHTHALMIC
  Filled 2012-08-01: qty 3

## 2012-08-01 MED ORDER — PROPOFOL 10 MG/ML IV BOLUS
INTRAVENOUS | Status: DC | PRN
  Administered 2012-08-01: 50 mg via INTRAVENOUS

## 2012-08-01 SURGICAL SUPPLY — 49 items
APL SRG 3 HI ABS STRL LF PLS (MISCELLANEOUS) ×1
APPLICATOR COTTON TIP 6IN STRL (MISCELLANEOUS) ×2 IMPLANT
APPLICATOR DR MATTHEWS STRL (MISCELLANEOUS) ×2 IMPLANT
BLADE KERATOME 2.75 (BLADE) ×2 IMPLANT
BLADE MINI RND TIP GREEN BEAV (BLADE) IMPLANT
BLADE STAB KNIFE 45DEG (BLADE) IMPLANT
CANNULA ANTERIOR CHAMBER 27GA (MISCELLANEOUS) ×2 IMPLANT
CLOTH BEACON ORANGE TIMEOUT ST (SAFETY) ×2 IMPLANT
CORDS BIPOLAR (ELECTRODE) IMPLANT
DRAPE OPHTHALMIC 40X48 W POUCH (DRAPES) ×2 IMPLANT
DRAPE RETRACTOR (MISCELLANEOUS) ×2 IMPLANT
FILTER BLUE MILLIPORE (MISCELLANEOUS) IMPLANT
GLOVE BIO SURGEON STRL SZ8 (GLOVE) ×1 IMPLANT
GLOVE SURG SS PI 6.0 STRL IVOR (GLOVE) ×2 IMPLANT
GLOVE SURG SS PI 6.5 STRL IVOR (GLOVE) ×1 IMPLANT
GLOVE SURG SS PI 8.0 STRL IVOR (GLOVE) ×2 IMPLANT
GOWN STRL NON-REIN LRG LVL3 (GOWN DISPOSABLE) ×4 IMPLANT
KIT BASIN OR (CUSTOM PROCEDURE TRAY) ×2 IMPLANT
KIT ROOM TURNOVER OR (KITS) ×2 IMPLANT
KNIFE CRESCENT 2.5 55 ANG (BLADE) IMPLANT
LENS IOL ACRSF IQ PC 17.5 (Intraocular Lens) IMPLANT
LENS IOL ACRYSOF IQ POST 17.5 (Intraocular Lens) ×2 IMPLANT
MASK EYE SHIELD (GAUZE/BANDAGES/DRESSINGS) ×1 IMPLANT
NDL 18GX1X1/2 (RX/OR ONLY) (NEEDLE) ×1 IMPLANT
NDL 25GX 5/8IN NON SAFETY (NEEDLE) ×1 IMPLANT
NDL FILTER BLUNT 18X1 1/2 (NEEDLE) ×1 IMPLANT
NDL HYPO 30X.5 LL (NEEDLE) IMPLANT
NEEDLE 18GX1X1/2 (RX/OR ONLY) (NEEDLE) ×2 IMPLANT
NEEDLE 25GX 5/8IN NON SAFETY (NEEDLE) ×2 IMPLANT
NEEDLE FILTER BLUNT 18X 1/2SAF (NEEDLE) ×1
NEEDLE FILTER BLUNT 18X1 1/2 (NEEDLE) ×1 IMPLANT
NEEDLE HYPO 30X.5 LL (NEEDLE) ×2 IMPLANT
NS IRRIG 1000ML POUR BTL (IV SOLUTION) ×2 IMPLANT
PACK CATARACT CUSTOM (CUSTOM PROCEDURE TRAY) ×2 IMPLANT
PACK CATARACT MCHSCP (PACKS) ×2 IMPLANT
PAD ARMBOARD 7.5X6 YLW CONV (MISCELLANEOUS) ×3 IMPLANT
PAD EYE OVAL STERILE LF (GAUZE/BANDAGES/DRESSINGS) ×1 IMPLANT
PROBE ANTERIOR 20G W/INFUS NDL (MISCELLANEOUS) IMPLANT
SPEAR EYE SURG WECK-CEL (MISCELLANEOUS) IMPLANT
SUT ETHILON 10 0 CS140 6 (SUTURE) ×2 IMPLANT
SUT SILK 4 0 C 3 735G (SUTURE) IMPLANT
SUT SILK 6 0 G 6 (SUTURE) IMPLANT
SUT VICRYL 8 0 TG140 8 (SUTURE) IMPLANT
SYR 3ML LL SCALE MARK (SYRINGE) IMPLANT
SYR TB 1ML LUER SLIP (SYRINGE) ×2 IMPLANT
TAPE SURG TRANSPORE 1 IN (GAUZE/BANDAGES/DRESSINGS) IMPLANT
TAPE SURGICAL TRANSPORE 1 IN (GAUZE/BANDAGES/DRESSINGS) ×1
TOWEL OR 17X24 6PK STRL BLUE (TOWEL DISPOSABLE) ×4 IMPLANT
WATER STERILE IRR 1000ML POUR (IV SOLUTION) ×2 IMPLANT

## 2012-08-01 NOTE — Brief Op Note (Signed)
First set of drops given at 0610,shows up on [facility] as inactive medications

## 2012-08-01 NOTE — Anesthesia Preprocedure Evaluation (Signed)
Anesthesia Evaluation  Patient identified by MRN, date of birth, ID band Patient awake    Reviewed: Allergy & Precautions, H&P , NPO status , Patient's Chart, lab work & pertinent test results  Airway Mallampati: II      Dental   Pulmonary          Cardiovascular hypertension, Pt. on medications     Neuro/Psych    GI/Hepatic negative GI ROS, Neg liver ROS,   Endo/Other  negative endocrine ROS  Renal/GU negative Renal ROS     Musculoskeletal   Abdominal   Peds  Hematology   Anesthesia Other Findings   Reproductive/Obstetrics                           Anesthesia Physical Anesthesia Plan  ASA: III  Anesthesia Plan: MAC   Post-op Pain Management:    Induction: Intravenous  Airway Management Planned: Simple Face Mask  Additional Equipment:   Intra-op Plan:   Post-operative Plan:   Informed Consent: I have reviewed the patients History and Physical, chart, labs and discussed the procedure including the risks, benefits and alternatives for the proposed anesthesia with the patient or authorized representative who has indicated his/her understanding and acceptance.     Plan Discussed with: CRNA and Anesthesiologist  Anesthesia Plan Comments:         Anesthesia Quick Evaluation

## 2012-08-01 NOTE — Anesthesia Postprocedure Evaluation (Signed)
  Anesthesia Post-op Note  Patient: Joy [name]  Procedure(s) Performed: Procedure(s) (LRB) with comments: CATARACT EXTRACTION PHACO AND INTRAOCULAR LENS PLACEMENT (IOC) (Right)  Patient Location: Short Stay  Anesthesia Type:MAC  Level of Consciousness: awake, alert  and oriented  Airway and Oxygen Therapy: Patient Spontanous Breathing  Post-op Pain: none  Post-op Assessment: Post-op Vital signs reviewed, Patient's Cardiovascular Status Stable, Respiratory Function Stable, Patent Airway, No signs of Nausea or vomiting, Adequate PO intake and Pain level controlled  Post-op Vital Signs: Reviewed and stable  Complications: No apparent anesthesia complications

## 2012-08-01 NOTE — Preoperative (Signed)
Beta Blockers   Reason not to administer Beta Blockers:Not Applicable 

## 2012-08-01 NOTE — Op Note (Signed)
Preoperative diagnosis: Visually significant cataract right eye Postoperative diagnosis: Same Procedure phacoemulsification with intraocular lens implant right eye Complications: None Anesthesia: 2% Xylocaine with epinephrine a 50-50 mixture 0.75% Marcaine with ample [place] Surgeon: Chalmers Guest Assistant: Mindy Procedure: The patient was taken to the operating room where she was given a peribulbar block with the aforementioned local anesthetic agent. Following this the patient's face was prepped and draped in the usual sterile fashion. With the surgeon sitting temporally a Weck-Cel sponges used to fixate the globe and a 15 blade was used to enter through superior clear cornea at the 11:00 position Viscoat was injected into the eye. An additional Weck-Cel sponge was used to fixate the globe and a 2.75 mm keratome blade was used in a stepwise fashion through temporal clear cornea following this a bent 25-gauge needle was used to incise anterior capsule and a continuous tear curvilinear capsulorrhexis was formed. The nucleus was then hydrated with balanced salt solution and hydrodelineate. Nucleus was seen to rotate then the capsular bag. Following this the phacoemulsification unit was used to remove the epinucleus and the nucleus was then sculpted into 4 troughs. Following this the Kuglen hook and the phacotip were used to separate the nucleus into 4 quadrants all 4 quadrants were then aspirated from the eye and the posterior capsule remained intact. Following this the I/A was used to remove the epinucleus and strip cortical fibrous and the posterior capsule after all cortical fibers had been stripped the posterior capsule remained intact therefore Provisc was injected in the eye the intraocular lens implant was examined and noted to have no defects. The lens is an Alcon AcrySof model SN 60 WF 17.5 diopter lens SN #[id number] the lens is placed in the lens injector and injected into the eye it was positioned  with a Kuglen hook. Following this the I/A was used to remove viscoelastic Provisc from the eye and subincisional cortex was removed after this the posterior capsule remained intact Miochol was injected in the eye. A 10-0 nylon suture was placed and the eye was pressurized there being no leakage to suture was buried additional BSS had been injected. The chamber remained deep the lens was in good position. Chalmers Guest Junior M.D.

## 2012-08-01 NOTE — H&P (View-Only) (Signed)
                  History & Physical:   DATE:   07-03-12  NAME:  Joy [name]     [id number]       HISTORY OF PRESENT ILLNESS: Chief Eye Complaints blurry vision    patient  notes difficulty seeing Tv & daily activites      HPI: EYES: Reports symptoms of     LOCATION:      QUALITY/COURSE:   Reports condition is   INTENSITY/SEVERITY:    Reports measurement ( or degree) as   DURATION:   Reports the general length of symptoms to be   ONSET/TIMING:   Reports occurrence as   CONTEXT/WHEN:   Reports usually associated with   MODIFIERS/TREATMENTS:  Improved by              ROS:   GEN- Constitutional: HENT: GEN - Endocrine: Reports symptoms of LUNGS/Respiratory:  HEART/Cardiovascular: Reports symptoms of hypertension.    ABD/Gastrointestinal:   Musculoskeletal (BJE): +++++      osteoarthritis/osteoarthrosis   NEURO/Neurological: PSYCH/Psychiatric:    Is the pt oriented to time, place, person?  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    Rheumatoid arthritis   ICD#714.0  SURGERIES: spinal stenosis sx lower back 1995 Rt Knee replacement   1997 Breast Reduction sx  2011 Pick List - Surgeries  MEDICATIONS: Asprin study  Prednisone (Deltasone, Orasone): 10 mg (tablet)  SIG-  1 each   once a day or as directed     Lyrica (Pregabalin):   50 mg (capsule)  SIG-  1 cap(s)   3 times a day    Methotrexate:   2.5 mg (tablet)  SIG-  1 tab(s)  to 3 as directed    as directed     Lipitor (Atorvastatin):   40 mg (tablet)  SIG-  1 tab(s)   once a day (at bedtime)    Diovan HCT (Valsartan/HCTZ):        12.5 mg-160 mg (tablet) 80/12mg   SIG-  1 each   once a day    Pantoprazole: Strength-  SIG-  Dose-  Freq-     Advair Diskus (Fluticasone/Salmeterol):   100 mcg-50 mcg (powder)  SIG-  1 inhalation   every 12 hours    Multi-Vitamins with  Minerals:   Multiple Vitamins with Minerals (tablet)  SIG-  1 -   once a day   i.e. Theragran-M and similar   Fish Oil:   - (capsule) 1000 mg   SIG-  1 -    Once daily  REVIEW OF SYSTEMS: not found  TOBACCO: Former Smoker. ICD#V15.82      Pick List - Tobacco - Summary  SOCIAL HISTORY: Single.  Retired  FAMILY HISTORY: Positive family history for  -   Diabetes/HTN/ Negative family history for  -   PARENTS: CHILDREN: GRANDPARENTS: SIBLINGS:  Sisters Diabetes UNCLES/AUNTS: OTHERS/DISTANT: Nephew , Niece  Diabetes  ALLERGIES: Adhesive tape Allergic contact dermatitis to latex   ICD#V15.07   Drug Allergies.  No Known.    Starter - Allergies - Summary:  PHYSICAL EXAMINATION: Exam: GENERAL: Appearance: General appearance can be described as well-nourished, well-developed, and in no acute distress.         Va     OD:cc 20/40      ph  NI OS:cc 20/30  ph NI  EYEGLASSES:  OD  -2.75 + 0.50 x 064                                             OS   -3.75 + 1.50 x 101 ADD:   +2.00  MR  OD-1.75 + 0.75 x 174  20/40 OS -3.00 + 0.50 x 115  20/25 ADD   +2.50  VF:  OD                                               OS  PUPILS: 3mm OU  EYELIDS & OCULAR ADNEXA normal  SLE: Conjunctiva:trace injection  Cornea:Arcus   Decreased Tear Break-Up Time + 1 staining OU    Anterior Chamber:Deep and Quiet    Iris: Brown  Lens: Cortical Cataract    Vitreous  CCT  Ta   in mmHg: OD:17             OS:17 Time:06/10/2012 11:35   Gonio:   Dilation:  Fundus:  optic nerve OD:35% cupping                                                   OS35%   Macula:       OD:flat OS:clear  Vessels:normal  Periphery:  BP: 110/68 Pulse:72 Resp: 16   Exam: GENERAL: Appearance: General appearance can be described as well-nourished, well-developed, and in no acute distress.    LYMPHATIC: HEAD, EARS, NOSE AND THROAT: Ears-Nose (external) Inspection: Externally, nose and ears are  normal in appearance and without scars, lesions, or nodules.      Otoscopic Exam: External auditory canals and tympanic membranes are normal.      Hearing assessment shows no problems with normal conversation.    Nose exam, internally, reveals nasal mucosa, septum and turbinates are unremarkable.    Teeth, Gingiva, and Lip Exams: No lesions or evidence of infection.      Oropharynx demonstrates oral mucosa, salivary glands, tongue, tonsils, posterior pharynx, hard-soft palates are normal.  EYES: see above  NECK: Neck tissue exam demonstrates no masses, symmetrical, and trachea is midline.      LUNGS and RESPIRATORY: Lung auscultation elicits no wheezing, rhonci, rales or rubs and with equal breath sounds.    Respiratory effort described as breathing is unlabored and chest movement is symmetrical.    HEART (Cardiovascular): Heart auscultation discovers regular rate and rhythm; no murmur, gallop or rub. Normal heart sounds.    ABDOMEN (Gastrointestinal): Mass/Tenderness Exam: Neither are present.     Liver/Spleen: No hepatomegaly or splenomegaly.   MUSCULOSKELETAL (BJE): Inspection-Palpation: No major bone, joint, tendon, or muscle changes.      NEUROLOGICAL: Alert and oriented. No major deficits of coordination or sensation.      PSYCHIATRIC: Insight and judgment appear  both to be intact and appropriate.    Mood and affect are described as normal mood and full affect.    SKIN: Skin Inspection: No rashes or lesions.  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   Actions: :  Lab/Tests:  per anesthesia    ___________________________ Silvano Garofano, Jr. Starter -  Inactive Problems:  

## 2012-08-01 NOTE — Transfer of Care (Signed)
Immediate Anesthesia Transfer of Care Note  Patient: Joy [name]  Procedure(s) Performed: Procedure(s) (LRB) with comments: CATARACT EXTRACTION PHACO AND INTRAOCULAR LENS PLACEMENT (IOC) (Right)  Patient Location: Short Stay  Anesthesia Type:MAC  Level of Consciousness: awake, alert  and oriented  Airway & Oxygen Therapy: Patient Spontanous Breathing  Post-op Assessment: Report given to PACU RN, Post -op Vital signs reviewed and stable and Patient moving all extremities  Post vital signs: Reviewed and stable  Complications: No apparent anesthesia complications

## 2012-08-01 NOTE — Interval H&P Note (Signed)
History and Physical Interval Note:  08/01/2012 7:33 AM  Joy [name]  has presented today for surgery, with the diagnosis of LAMELLAR OR ZONULAR CATARACT  The various methods of treatment have been discussed with the patient and family. After consideration of risks, benefits and other options for treatment, the patient has consented to  Procedure(s) (LRB) with comments: CATARACT EXTRACTION PHACO AND INTRAOCULAR LENS PLACEMENT (IOC) (Right) as a surgical intervention .  The patient's history has been reviewed, patient examined, no change in status, stable for surgery.  I have reviewed the patient's chart and labs.  Questions were answered to the patient's satisfaction.     Charli Liberatore

## 2012-08-04 ENCOUNTER — Encounter (HOSPITAL_COMMUNITY): Payer: Self-pay | Admitting: Ophthalmology

## 2012-08-08 DIAGNOSIS — M5430 Sciatica, unspecified side: Secondary | ICD-10-CM | POA: Diagnosis not present

## 2012-08-08 DIAGNOSIS — M545 Low back pain: Secondary | ICD-10-CM | POA: Diagnosis not present

## 2012-08-08 DIAGNOSIS — M25569 Pain in unspecified knee: Secondary | ICD-10-CM | POA: Diagnosis not present

## 2012-08-20 HISTORY — PX: JOINT REPLACEMENT: SHX530

## 2012-08-21 DIAGNOSIS — M069 Rheumatoid arthritis, unspecified: Secondary | ICD-10-CM | POA: Diagnosis not present

## 2012-08-22 DIAGNOSIS — M542 Cervicalgia: Secondary | ICD-10-CM | POA: Diagnosis not present

## 2012-08-22 DIAGNOSIS — I10 Essential (primary) hypertension: Secondary | ICD-10-CM | POA: Diagnosis not present

## 2012-08-22 DIAGNOSIS — E0789 Other specified disorders of thyroid: Secondary | ICD-10-CM | POA: Diagnosis not present

## 2012-09-24 DIAGNOSIS — M1710 Unilateral primary osteoarthritis, unspecified knee: Secondary | ICD-10-CM | POA: Diagnosis not present

## 2012-09-24 DIAGNOSIS — M76899 Other specified enthesopathies of unspecified lower limb, excluding foot: Secondary | ICD-10-CM | POA: Diagnosis not present

## 2012-09-25 DIAGNOSIS — M2550 Pain in unspecified joint: Secondary | ICD-10-CM | POA: Diagnosis not present

## 2012-09-25 DIAGNOSIS — Z79899 Other long term (current) drug therapy: Secondary | ICD-10-CM | POA: Diagnosis not present

## 2012-09-25 DIAGNOSIS — M1990 Unspecified osteoarthritis, unspecified site: Secondary | ICD-10-CM | POA: Diagnosis not present

## 2012-09-25 DIAGNOSIS — Z1589 Genetic susceptibility to other disease: Secondary | ICD-10-CM | POA: Diagnosis not present

## 2012-09-25 DIAGNOSIS — M069 Rheumatoid arthritis, unspecified: Secondary | ICD-10-CM | POA: Diagnosis not present

## 2012-10-01 DIAGNOSIS — M1710 Unilateral primary osteoarthritis, unspecified knee: Secondary | ICD-10-CM | POA: Diagnosis not present

## 2012-10-07 DIAGNOSIS — M1710 Unilateral primary osteoarthritis, unspecified knee: Secondary | ICD-10-CM | POA: Diagnosis not present

## 2012-10-08 DIAGNOSIS — M1710 Unilateral primary osteoarthritis, unspecified knee: Secondary | ICD-10-CM | POA: Diagnosis not present

## 2012-10-10 DIAGNOSIS — M76899 Other specified enthesopathies of unspecified lower limb, excluding foot: Secondary | ICD-10-CM | POA: Diagnosis not present

## 2012-10-14 DIAGNOSIS — M76899 Other specified enthesopathies of unspecified lower limb, excluding foot: Secondary | ICD-10-CM | POA: Diagnosis not present

## 2012-10-17 DIAGNOSIS — M76899 Other specified enthesopathies of unspecified lower limb, excluding foot: Secondary | ICD-10-CM | POA: Diagnosis not present

## 2012-10-21 DIAGNOSIS — M76899 Other specified enthesopathies of unspecified lower limb, excluding foot: Secondary | ICD-10-CM | POA: Diagnosis not present

## 2012-11-03 DIAGNOSIS — Z1231 Encounter for screening mammogram for malignant neoplasm of breast: Secondary | ICD-10-CM | POA: Diagnosis not present

## 2012-11-10 DIAGNOSIS — G5600 Carpal tunnel syndrome, unspecified upper limb: Secondary | ICD-10-CM | POA: Diagnosis not present

## 2012-11-25 DIAGNOSIS — H04129 Dry eye syndrome of unspecified lacrimal gland: Secondary | ICD-10-CM | POA: Diagnosis not present

## 2012-11-25 DIAGNOSIS — H10509 Unspecified blepharoconjunctivitis, unspecified eye: Secondary | ICD-10-CM | POA: Diagnosis not present

## 2012-11-25 DIAGNOSIS — H35039 Hypertensive retinopathy, unspecified eye: Secondary | ICD-10-CM | POA: Diagnosis not present

## 2012-11-27 DIAGNOSIS — M069 Rheumatoid arthritis, unspecified: Secondary | ICD-10-CM | POA: Diagnosis not present

## 2012-11-27 DIAGNOSIS — M1990 Unspecified osteoarthritis, unspecified site: Secondary | ICD-10-CM | POA: Diagnosis not present

## 2012-11-27 DIAGNOSIS — Z1589 Genetic susceptibility to other disease: Secondary | ICD-10-CM | POA: Diagnosis not present

## 2012-11-27 DIAGNOSIS — Z79899 Other long term (current) drug therapy: Secondary | ICD-10-CM | POA: Diagnosis not present

## 2012-11-27 DIAGNOSIS — M2550 Pain in unspecified joint: Secondary | ICD-10-CM | POA: Diagnosis not present

## 2012-12-26 DIAGNOSIS — J309 Allergic rhinitis, unspecified: Secondary | ICD-10-CM | POA: Diagnosis not present

## 2013-01-08 DIAGNOSIS — G609 Hereditary and idiopathic neuropathy, unspecified: Secondary | ICD-10-CM | POA: Diagnosis not present

## 2013-01-08 DIAGNOSIS — L659 Nonscarring hair loss, unspecified: Secondary | ICD-10-CM | POA: Diagnosis not present

## 2013-01-08 DIAGNOSIS — E7800 Pure hypercholesterolemia, unspecified: Secondary | ICD-10-CM | POA: Diagnosis not present

## 2013-01-08 DIAGNOSIS — R7301 Impaired fasting glucose: Secondary | ICD-10-CM | POA: Diagnosis not present

## 2013-01-21 DIAGNOSIS — M1710 Unilateral primary osteoarthritis, unspecified knee: Secondary | ICD-10-CM | POA: Diagnosis not present

## 2013-01-21 DIAGNOSIS — M169 Osteoarthritis of hip, unspecified: Secondary | ICD-10-CM | POA: Diagnosis not present

## 2013-02-25 DIAGNOSIS — Z79899 Other long term (current) drug therapy: Secondary | ICD-10-CM | POA: Diagnosis not present

## 2013-02-25 DIAGNOSIS — M2550 Pain in unspecified joint: Secondary | ICD-10-CM | POA: Diagnosis not present

## 2013-02-25 DIAGNOSIS — Z1589 Genetic susceptibility to other disease: Secondary | ICD-10-CM | POA: Diagnosis not present

## 2013-02-25 DIAGNOSIS — M069 Rheumatoid arthritis, unspecified: Secondary | ICD-10-CM | POA: Diagnosis not present

## 2013-03-02 DIAGNOSIS — J309 Allergic rhinitis, unspecified: Secondary | ICD-10-CM | POA: Diagnosis not present

## 2013-03-25 DIAGNOSIS — M069 Rheumatoid arthritis, unspecified: Secondary | ICD-10-CM | POA: Diagnosis not present

## 2013-03-27 DIAGNOSIS — M545 Low back pain: Secondary | ICD-10-CM | POA: Diagnosis not present

## 2013-04-10 DIAGNOSIS — M1710 Unilateral primary osteoarthritis, unspecified knee: Secondary | ICD-10-CM | POA: Diagnosis not present

## 2013-04-17 DIAGNOSIS — M1710 Unilateral primary osteoarthritis, unspecified knee: Secondary | ICD-10-CM | POA: Diagnosis not present

## 2013-04-23 DIAGNOSIS — H35039 Hypertensive retinopathy, unspecified eye: Secondary | ICD-10-CM | POA: Diagnosis not present

## 2013-04-23 DIAGNOSIS — H43819 Vitreous degeneration, unspecified eye: Secondary | ICD-10-CM | POA: Diagnosis not present

## 2013-04-24 DIAGNOSIS — M1710 Unilateral primary osteoarthritis, unspecified knee: Secondary | ICD-10-CM | POA: Diagnosis not present

## 2013-05-08 DIAGNOSIS — M25569 Pain in unspecified knee: Secondary | ICD-10-CM | POA: Diagnosis not present

## 2013-05-08 DIAGNOSIS — M545 Low back pain: Secondary | ICD-10-CM | POA: Diagnosis not present

## 2013-05-20 DIAGNOSIS — M545 Low back pain: Secondary | ICD-10-CM | POA: Diagnosis not present

## 2013-05-28 DIAGNOSIS — Z1589 Genetic susceptibility to other disease: Secondary | ICD-10-CM | POA: Diagnosis not present

## 2013-05-28 DIAGNOSIS — M069 Rheumatoid arthritis, unspecified: Secondary | ICD-10-CM | POA: Diagnosis not present

## 2013-05-28 DIAGNOSIS — M2550 Pain in unspecified joint: Secondary | ICD-10-CM | POA: Diagnosis not present

## 2013-05-28 DIAGNOSIS — Z79899 Other long term (current) drug therapy: Secondary | ICD-10-CM | POA: Diagnosis not present

## 2013-06-03 DIAGNOSIS — J069 Acute upper respiratory infection, unspecified: Secondary | ICD-10-CM | POA: Diagnosis not present

## 2013-06-05 DIAGNOSIS — M545 Low back pain: Secondary | ICD-10-CM | POA: Diagnosis not present

## 2013-06-10 DIAGNOSIS — M069 Rheumatoid arthritis, unspecified: Secondary | ICD-10-CM | POA: Diagnosis not present

## 2013-06-19 DIAGNOSIS — Z23 Encounter for immunization: Secondary | ICD-10-CM | POA: Diagnosis not present

## 2013-06-24 DIAGNOSIS — M069 Rheumatoid arthritis, unspecified: Secondary | ICD-10-CM | POA: Diagnosis not present

## 2013-07-22 DIAGNOSIS — M069 Rheumatoid arthritis, unspecified: Secondary | ICD-10-CM | POA: Diagnosis not present

## 2013-08-26 DIAGNOSIS — M545 Low back pain, unspecified: Secondary | ICD-10-CM | POA: Diagnosis not present

## 2013-08-26 DIAGNOSIS — Z96659 Presence of unspecified artificial knee joint: Secondary | ICD-10-CM | POA: Diagnosis not present

## 2013-08-27 DIAGNOSIS — M2550 Pain in unspecified joint: Secondary | ICD-10-CM | POA: Diagnosis not present

## 2013-08-27 DIAGNOSIS — Z79899 Other long term (current) drug therapy: Secondary | ICD-10-CM | POA: Diagnosis not present

## 2013-08-27 DIAGNOSIS — Z1589 Genetic susceptibility to other disease: Secondary | ICD-10-CM | POA: Diagnosis not present

## 2013-08-27 DIAGNOSIS — M069 Rheumatoid arthritis, unspecified: Secondary | ICD-10-CM | POA: Diagnosis not present

## 2013-09-01 DIAGNOSIS — M545 Low back pain, unspecified: Secondary | ICD-10-CM | POA: Diagnosis not present

## 2013-09-15 DIAGNOSIS — M069 Rheumatoid arthritis, unspecified: Secondary | ICD-10-CM | POA: Diagnosis not present

## 2013-09-16 DIAGNOSIS — M545 Low back pain, unspecified: Secondary | ICD-10-CM | POA: Diagnosis not present

## 2013-09-28 DIAGNOSIS — R05 Cough: Secondary | ICD-10-CM | POA: Diagnosis not present

## 2013-09-28 DIAGNOSIS — R059 Cough, unspecified: Secondary | ICD-10-CM | POA: Diagnosis not present

## 2013-10-21 DIAGNOSIS — R7301 Impaired fasting glucose: Secondary | ICD-10-CM | POA: Diagnosis not present

## 2013-10-21 DIAGNOSIS — E7800 Pure hypercholesterolemia, unspecified: Secondary | ICD-10-CM | POA: Diagnosis not present

## 2013-10-21 DIAGNOSIS — J069 Acute upper respiratory infection, unspecified: Secondary | ICD-10-CM | POA: Diagnosis not present

## 2013-10-26 DIAGNOSIS — J069 Acute upper respiratory infection, unspecified: Secondary | ICD-10-CM | POA: Diagnosis not present

## 2013-10-26 DIAGNOSIS — E7800 Pure hypercholesterolemia, unspecified: Secondary | ICD-10-CM | POA: Diagnosis not present

## 2013-10-26 DIAGNOSIS — R7301 Impaired fasting glucose: Secondary | ICD-10-CM | POA: Diagnosis not present

## 2013-11-10 DIAGNOSIS — M069 Rheumatoid arthritis, unspecified: Secondary | ICD-10-CM | POA: Diagnosis not present

## 2013-11-26 DIAGNOSIS — M2550 Pain in unspecified joint: Secondary | ICD-10-CM | POA: Diagnosis not present

## 2013-11-26 DIAGNOSIS — M069 Rheumatoid arthritis, unspecified: Secondary | ICD-10-CM | POA: Diagnosis not present

## 2013-11-26 DIAGNOSIS — Z79899 Other long term (current) drug therapy: Secondary | ICD-10-CM | POA: Diagnosis not present

## 2013-11-26 DIAGNOSIS — Z1589 Genetic susceptibility to other disease: Secondary | ICD-10-CM | POA: Diagnosis not present

## 2013-12-10 DIAGNOSIS — Z1231 Encounter for screening mammogram for malignant neoplasm of breast: Secondary | ICD-10-CM | POA: Diagnosis not present

## 2013-12-15 DIAGNOSIS — R928 Other abnormal and inconclusive findings on diagnostic imaging of breast: Secondary | ICD-10-CM | POA: Diagnosis not present

## 2013-12-31 DIAGNOSIS — M545 Low back pain, unspecified: Secondary | ICD-10-CM | POA: Diagnosis not present

## 2014-01-05 DIAGNOSIS — M069 Rheumatoid arthritis, unspecified: Secondary | ICD-10-CM | POA: Diagnosis not present

## 2014-02-24 DIAGNOSIS — M2550 Pain in unspecified joint: Secondary | ICD-10-CM | POA: Diagnosis not present

## 2014-02-24 DIAGNOSIS — Z1589 Genetic susceptibility to other disease: Secondary | ICD-10-CM | POA: Diagnosis not present

## 2014-02-24 DIAGNOSIS — M069 Rheumatoid arthritis, unspecified: Secondary | ICD-10-CM | POA: Diagnosis not present

## 2014-02-24 DIAGNOSIS — M545 Low back pain, unspecified: Secondary | ICD-10-CM | POA: Diagnosis not present

## 2014-03-01 DIAGNOSIS — Z1331 Encounter for screening for depression: Secondary | ICD-10-CM | POA: Diagnosis not present

## 2014-03-01 DIAGNOSIS — H6590 Unspecified nonsuppurative otitis media, unspecified ear: Secondary | ICD-10-CM | POA: Diagnosis not present

## 2014-03-01 DIAGNOSIS — K219 Gastro-esophageal reflux disease without esophagitis: Secondary | ICD-10-CM | POA: Diagnosis not present

## 2014-03-01 DIAGNOSIS — M069 Rheumatoid arthritis, unspecified: Secondary | ICD-10-CM | POA: Diagnosis not present

## 2014-03-02 DIAGNOSIS — M069 Rheumatoid arthritis, unspecified: Secondary | ICD-10-CM | POA: Diagnosis not present

## 2014-03-17 DIAGNOSIS — M1710 Unilateral primary osteoarthritis, unspecified knee: Secondary | ICD-10-CM | POA: Diagnosis not present

## 2014-03-24 DIAGNOSIS — M1710 Unilateral primary osteoarthritis, unspecified knee: Secondary | ICD-10-CM | POA: Diagnosis not present

## 2014-03-31 DIAGNOSIS — M1710 Unilateral primary osteoarthritis, unspecified knee: Secondary | ICD-10-CM | POA: Diagnosis not present

## 2014-04-16 DIAGNOSIS — Z23 Encounter for immunization: Secondary | ICD-10-CM | POA: Diagnosis not present

## 2014-04-16 DIAGNOSIS — M069 Rheumatoid arthritis, unspecified: Secondary | ICD-10-CM | POA: Diagnosis not present

## 2014-04-16 DIAGNOSIS — Z1589 Genetic susceptibility to other disease: Secondary | ICD-10-CM | POA: Diagnosis not present

## 2014-04-16 DIAGNOSIS — Z0000 Encounter for general adult medical examination without abnormal findings: Secondary | ICD-10-CM | POA: Diagnosis not present

## 2014-04-16 DIAGNOSIS — R7301 Impaired fasting glucose: Secondary | ICD-10-CM | POA: Diagnosis not present

## 2014-04-16 DIAGNOSIS — E7800 Pure hypercholesterolemia, unspecified: Secondary | ICD-10-CM | POA: Diagnosis not present

## 2014-04-21 DIAGNOSIS — H43819 Vitreous degeneration, unspecified eye: Secondary | ICD-10-CM | POA: Diagnosis not present

## 2014-04-21 DIAGNOSIS — H25019 Cortical age-related cataract, unspecified eye: Secondary | ICD-10-CM | POA: Diagnosis not present

## 2014-04-27 DIAGNOSIS — M069 Rheumatoid arthritis, unspecified: Secondary | ICD-10-CM | POA: Diagnosis not present

## 2014-04-29 DIAGNOSIS — M810 Age-related osteoporosis without current pathological fracture: Secondary | ICD-10-CM | POA: Diagnosis not present

## 2014-04-30 DIAGNOSIS — M961 Postlaminectomy syndrome, not elsewhere classified: Secondary | ICD-10-CM | POA: Diagnosis not present

## 2014-05-19 DIAGNOSIS — M545 Low back pain, unspecified: Secondary | ICD-10-CM | POA: Diagnosis not present

## 2014-05-19 DIAGNOSIS — M25819 Other specified joint disorders, unspecified shoulder: Secondary | ICD-10-CM | POA: Diagnosis not present

## 2014-05-27 DIAGNOSIS — M0589 Other rheumatoid arthritis with rheumatoid factor of multiple sites: Secondary | ICD-10-CM | POA: Diagnosis not present

## 2014-05-27 DIAGNOSIS — M545 Low back pain: Secondary | ICD-10-CM | POA: Diagnosis not present

## 2014-05-27 DIAGNOSIS — Z1589 Genetic susceptibility to other disease: Secondary | ICD-10-CM | POA: Diagnosis not present

## 2014-05-27 DIAGNOSIS — M2550 Pain in unspecified joint: Secondary | ICD-10-CM | POA: Diagnosis not present

## 2014-05-31 DIAGNOSIS — M810 Age-related osteoporosis without current pathological fracture: Secondary | ICD-10-CM | POA: Diagnosis not present

## 2014-06-01 DIAGNOSIS — R921 Mammographic calcification found on diagnostic imaging of breast: Secondary | ICD-10-CM | POA: Diagnosis not present

## 2014-06-03 ENCOUNTER — Other Ambulatory Visit: Payer: Self-pay | Admitting: Radiology

## 2014-06-03 DIAGNOSIS — R921 Mammographic calcification found on diagnostic imaging of breast: Secondary | ICD-10-CM | POA: Diagnosis not present

## 2014-06-03 DIAGNOSIS — N6489 Other specified disorders of breast: Secondary | ICD-10-CM | POA: Diagnosis not present

## 2014-06-08 DIAGNOSIS — I10 Essential (primary) hypertension: Secondary | ICD-10-CM | POA: Diagnosis not present

## 2014-06-08 DIAGNOSIS — M4726 Other spondylosis with radiculopathy, lumbar region: Secondary | ICD-10-CM | POA: Diagnosis not present

## 2014-06-08 DIAGNOSIS — Z6837 Body mass index (BMI) 37.0-37.9, adult: Secondary | ICD-10-CM | POA: Diagnosis not present

## 2014-06-17 ENCOUNTER — Other Ambulatory Visit (HOSPITAL_COMMUNITY): Payer: Self-pay | Admitting: Neurosurgery

## 2014-06-17 DIAGNOSIS — M4726 Other spondylosis with radiculopathy, lumbar region: Secondary | ICD-10-CM

## 2014-06-25 ENCOUNTER — Other Ambulatory Visit (HOSPITAL_COMMUNITY): Payer: Self-pay | Admitting: Neurosurgery

## 2014-06-25 DIAGNOSIS — M0589 Other rheumatoid arthritis with rheumatoid factor of multiple sites: Secondary | ICD-10-CM | POA: Diagnosis not present

## 2014-06-29 ENCOUNTER — Ambulatory Visit (HOSPITAL_COMMUNITY): Admission: RE | Admit: 2014-06-29 | Payer: Medicare Other | Source: Ambulatory Visit

## 2014-06-29 ENCOUNTER — Inpatient Hospital Stay (HOSPITAL_COMMUNITY): Admission: RE | Admit: 2014-06-29 | Payer: Medicare Other | Source: Ambulatory Visit

## 2014-06-29 ENCOUNTER — Other Ambulatory Visit (HOSPITAL_COMMUNITY): Payer: Self-pay | Admitting: Neurosurgery

## 2014-07-01 ENCOUNTER — Other Ambulatory Visit (HOSPITAL_COMMUNITY): Payer: Self-pay | Admitting: Neurosurgery

## 2014-07-01 DIAGNOSIS — M4306 Spondylolysis, lumbar region: Secondary | ICD-10-CM

## 2014-07-21 ENCOUNTER — Ambulatory Visit (HOSPITAL_COMMUNITY)
Admission: RE | Admit: 2014-07-21 | Discharge: 2014-07-21 | Disposition: A | Discharge status: Home / Self Care | Payer: Medicare Other | Source: Ambulatory Visit | Attending: Neurosurgery | Admitting: Neurosurgery

## 2014-07-21 VITALS — BP 124/85 | HR 68 | Temp 97.4°F | Resp 18 | Ht 62.0 in | Wt 200.0 lb

## 2014-07-21 DIAGNOSIS — M4806 Spinal stenosis, lumbar region: Secondary | ICD-10-CM | POA: Insufficient documentation

## 2014-07-21 DIAGNOSIS — M4726 Other spondylosis with radiculopathy, lumbar region: Secondary | ICD-10-CM

## 2014-07-21 DIAGNOSIS — Z981 Arthrodesis status: Secondary | ICD-10-CM | POA: Diagnosis not present

## 2014-07-21 DIAGNOSIS — M47816 Spondylosis without myelopathy or radiculopathy, lumbar region: Secondary | ICD-10-CM | POA: Diagnosis not present

## 2014-07-21 DIAGNOSIS — M5136 Other intervertebral disc degeneration, lumbar region: Secondary | ICD-10-CM | POA: Diagnosis not present

## 2014-07-21 DIAGNOSIS — M5126 Other intervertebral disc displacement, lumbar region: Secondary | ICD-10-CM | POA: Insufficient documentation

## 2014-07-21 DIAGNOSIS — M4306 Spondylolysis, lumbar region: Secondary | ICD-10-CM

## 2014-07-21 MED ORDER — DIAZEPAM 5 MG PO TABS
ORAL_TABLET | ORAL | Status: AC
Start: 2014-07-21 — End: 2014-07-21
  Filled 2014-07-21: qty 2

## 2014-07-21 MED ORDER — OXYCODONE HCL 5 MG PO TABS: 5.0000 mg | ORAL_TABLET | ORAL | Status: DC | PRN

## 2014-07-21 MED ORDER — ONDANSETRON HCL 4 MG/2ML IJ SOLN
4.0000 mg | Freq: Four times a day (QID) | INTRAMUSCULAR | Status: DC | PRN
Start: 2014-07-21 — End: 2014-07-22

## 2014-07-21 MED ORDER — IOHEXOL 180 MG/ML  SOLN
20.0000 mL | Freq: Once | INTRAMUSCULAR | Status: AC | PRN
  Administered 2014-07-21: 20 mL via INTRATHECAL

## 2014-07-21 MED ORDER — DIAZEPAM 5 MG PO TABS
10.0000 mg | ORAL_TABLET | Freq: Once | ORAL | Status: AC
  Administered 2014-07-21: 10 mg via ORAL
  Filled 2014-07-21: qty 2

## 2014-07-21 NOTE — Discharge Instructions (Signed)
Myelography, Care After °These instructions give you information on caring for yourself after your procedure. Your doctor may also give you specific instructions. Call your doctor if you have any problems or questions after your procedure. °HOME CARE °· Rest often the first day. °· When you rest, lie flat, with your head slightly raised (elevated). °· Avoid heavy lifting and activity for 48 hours. °· You may take the bandage (dressing) off 1 day after the test. °GET HELP RIGHT AWAY IF:  °· You have a very bad headache. °· You have a fever. °MAKE SURE YOU: °· Understand these instructions. °· Will watch your condition. °· Will get help right away if you are not doing well or get worse. °Document Released: 05/15/2008 Document Revised: 12/21/2013 Document Reviewed: 11/25/2013 °[employer]® Patient Information ©2015 [employer]. This information is not intended to replace advice given to you by your health care provider. Make sure you discuss any questions you have with your health care provider. ° ° °

## 2014-08-16 DIAGNOSIS — M0589 Other rheumatoid arthritis with rheumatoid factor of multiple sites: Secondary | ICD-10-CM | POA: Diagnosis not present

## 2014-08-24 DIAGNOSIS — M4806 Spinal stenosis, lumbar region: Secondary | ICD-10-CM | POA: Diagnosis not present

## 2014-08-24 DIAGNOSIS — M48061 Spinal stenosis, lumbar region without neurogenic claudication: Secondary | ICD-10-CM | POA: Insufficient documentation

## 2014-08-24 DIAGNOSIS — I10 Essential (primary) hypertension: Secondary | ICD-10-CM | POA: Diagnosis not present

## 2014-08-24 DIAGNOSIS — Z6837 Body mass index (BMI) 37.0-37.9, adult: Secondary | ICD-10-CM | POA: Diagnosis not present

## 2014-08-24 DIAGNOSIS — M4726 Other spondylosis with radiculopathy, lumbar region: Secondary | ICD-10-CM | POA: Diagnosis not present

## 2014-08-31 DIAGNOSIS — Z1589 Genetic susceptibility to other disease: Secondary | ICD-10-CM | POA: Diagnosis not present

## 2014-08-31 DIAGNOSIS — M0589 Other rheumatoid arthritis with rheumatoid factor of multiple sites: Secondary | ICD-10-CM | POA: Diagnosis not present

## 2014-08-31 DIAGNOSIS — M545 Low back pain: Secondary | ICD-10-CM | POA: Diagnosis not present

## 2014-08-31 DIAGNOSIS — M2550 Pain in unspecified joint: Secondary | ICD-10-CM | POA: Diagnosis not present

## 2014-09-08 DIAGNOSIS — M2042 Other hammer toe(s) (acquired), left foot: Secondary | ICD-10-CM | POA: Diagnosis not present

## 2014-09-08 DIAGNOSIS — B351 Tinea unguium: Secondary | ICD-10-CM | POA: Diagnosis not present

## 2014-09-18 ENCOUNTER — Emergency Department (HOSPITAL_BASED_OUTPATIENT_CLINIC_OR_DEPARTMENT_OTHER)
Admission: EM | Admit: 2014-09-18 | Discharge: 2014-09-19 | Disposition: A | Discharge status: Home / Self Care | Payer: Medicare Other | Attending: Emergency Medicine | Admitting: Emergency Medicine

## 2014-09-18 ENCOUNTER — Encounter (HOSPITAL_BASED_OUTPATIENT_CLINIC_OR_DEPARTMENT_OTHER): Payer: Self-pay | Admitting: *Deleted

## 2014-09-18 ENCOUNTER — Emergency Department (HOSPITAL_BASED_OUTPATIENT_CLINIC_OR_DEPARTMENT_OTHER): Payer: Medicare Other

## 2014-09-18 DIAGNOSIS — M069 Rheumatoid arthritis, unspecified: Secondary | ICD-10-CM | POA: Insufficient documentation

## 2014-09-18 DIAGNOSIS — I10 Essential (primary) hypertension: Secondary | ICD-10-CM | POA: Insufficient documentation

## 2014-09-18 DIAGNOSIS — Z91040 Latex allergy status: Secondary | ICD-10-CM | POA: Diagnosis not present

## 2014-09-18 DIAGNOSIS — S32512A Fracture of superior rim of left pubis, initial encounter for closed fracture: Secondary | ICD-10-CM | POA: Diagnosis not present

## 2014-09-18 DIAGNOSIS — M810 Age-related osteoporosis without current pathological fracture: Secondary | ICD-10-CM | POA: Insufficient documentation

## 2014-09-18 DIAGNOSIS — Z87891 Personal history of nicotine dependence: Secondary | ICD-10-CM | POA: Insufficient documentation

## 2014-09-18 DIAGNOSIS — Z8709 Personal history of other diseases of the respiratory system: Secondary | ICD-10-CM | POA: Diagnosis not present

## 2014-09-18 DIAGNOSIS — Y9389 Activity, other specified: Secondary | ICD-10-CM | POA: Diagnosis not present

## 2014-09-18 DIAGNOSIS — Z7952 Long term (current) use of systemic steroids: Secondary | ICD-10-CM | POA: Diagnosis not present

## 2014-09-18 DIAGNOSIS — S91311A Laceration without foreign body, right foot, initial encounter: Secondary | ICD-10-CM | POA: Diagnosis not present

## 2014-09-18 DIAGNOSIS — E780 Pure hypercholesterolemia: Secondary | ICD-10-CM | POA: Diagnosis not present

## 2014-09-18 DIAGNOSIS — Y9289 Other specified places as the place of occurrence of the external cause: Secondary | ICD-10-CM | POA: Diagnosis not present

## 2014-09-18 DIAGNOSIS — S32592A Other specified fracture of left pubis, initial encounter for closed fracture: Secondary | ICD-10-CM | POA: Insufficient documentation

## 2014-09-18 DIAGNOSIS — Y998 Other external cause status: Secondary | ICD-10-CM | POA: Diagnosis not present

## 2014-09-18 DIAGNOSIS — W11XXXA Fall on and from ladder, initial encounter: Secondary | ICD-10-CM | POA: Diagnosis not present

## 2014-09-18 DIAGNOSIS — Z79899 Other long term (current) drug therapy: Secondary | ICD-10-CM | POA: Insufficient documentation

## 2014-09-18 DIAGNOSIS — M25552 Pain in left hip: Secondary | ICD-10-CM | POA: Diagnosis not present

## 2014-09-18 DIAGNOSIS — S79912A Unspecified injury of left hip, initial encounter: Secondary | ICD-10-CM | POA: Diagnosis not present

## 2014-09-18 DIAGNOSIS — W19XXXA Unspecified fall, initial encounter: Secondary | ICD-10-CM

## 2014-09-18 HISTORY — DX: Bronchitis, not specified as acute or chronic: J40

## 2014-09-18 HISTORY — DX: Age-related osteoporosis without current pathological fracture: M81.0

## 2014-09-18 HISTORY — DX: Unspecified osteoarthritis, unspecified site: M19.90

## 2014-09-18 HISTORY — DX: Rheumatoid arthritis, unspecified: M06.9

## 2014-09-18 MED ORDER — LIDOCAINE HCL (PF) 1 % IJ SOLN
INTRAMUSCULAR | Status: AC
  Administered 2014-09-18: 21:00:00
  Filled 2014-09-18: qty 5

## 2014-09-18 MED ORDER — BACITRACIN 500 UNIT/GM EX OINT
1.0000 "application " | TOPICAL_OINTMENT | Freq: Two times a day (BID) | CUTANEOUS | Status: DC
  Administered 2014-09-18: 1 via TOPICAL
  Filled 2014-09-18: qty 0.9

## 2014-09-18 MED ORDER — HYDROCODONE-ACETAMINOPHEN 5-325 MG PO TABS: 1.0000 | ORAL_TABLET | Freq: Four times a day (QID) | ORAL | Status: DC | PRN

## 2014-09-18 MED ORDER — HYDROCODONE-ACETAMINOPHEN 5-325 MG PO TABS
2.0000 | ORAL_TABLET | Freq: Once | ORAL | Status: AC
  Administered 2014-09-18: 2 via ORAL
  Filled 2014-09-18: qty 2

## 2014-09-18 NOTE — ED Notes (Signed)
Pt in xray depart on 1st attempt to assess.

## 2014-09-18 NOTE — Discharge Instructions (Signed)
Laceration Care, Adult °A laceration is a cut or lesion that goes through all layers of the skin and into the tissue just beneath the skin. °TREATMENT  °Some lacerations may not require closure. Some lacerations may not be able to be closed due to an increased risk of infection. It is important to see your caregiver as soon as possible after an injury to minimize the risk of infection and maximize the opportunity for successful closure. °If closure is appropriate, pain medicines may be given, if needed. The wound will be cleaned to help prevent infection. Your caregiver will use stitches (sutures), staples, wound glue (adhesive), or skin adhesive strips to repair the laceration. These tools bring the skin edges together to allow for faster healing and a better cosmetic outcome. However, all wounds will heal with a scar. Once the wound has healed, scarring can be minimized by covering the wound with sunscreen during the day for 1 full year. °HOME CARE INSTRUCTIONS  °For sutures or staples: °· Keep the wound clean and dry. °· If you were given a bandage (dressing), you should change it at least once a day. Also, change the dressing if it becomes wet or dirty, or as directed by your caregiver. °· Wash the wound with soap and water 2 times a day. Rinse the wound off with water to remove all soap. Pat the wound dry with a clean towel. °· After cleaning, apply a thin layer of the antibiotic ointment as recommended by your caregiver. This will help prevent infection and keep the dressing from sticking. °· You may shower as usual after the first 24 hours. Do not soak the wound in water until the sutures are removed. °· Only take over-the-counter or prescription medicines for pain, discomfort, or fever as directed by your caregiver. °· Get your sutures or staples removed as directed by your caregiver. °For skin adhesive strips: °· Keep the wound clean and dry. °· Do not get the skin adhesive strips wet. You may bathe  carefully, using caution to keep the wound dry. °· If the wound gets wet, pat it dry with a clean towel. °· Skin adhesive strips will fall off on their own. You may trim the strips as the wound heals. Do not remove skin adhesive strips that are still stuck to the wound. They will fall off in time. °For wound adhesive: °· You may briefly wet your wound in the shower or bath. Do not soak or scrub the wound. Do not swim. Avoid periods of heavy perspiration until the skin adhesive has fallen off on its own. After showering or bathing, gently pat the wound dry with a clean towel. °· Do not apply liquid medicine, cream medicine, or ointment medicine to your wound while the skin adhesive is in place. This may loosen the film before your wound is healed. °· If a dressing is placed over the wound, be careful not to apply tape directly over the skin adhesive. This may cause the adhesive to be pulled off before the wound is healed. °· Avoid prolonged exposure to sunlight or tanning lamps while the skin adhesive is in place. Exposure to ultraviolet light in the first year will darken the scar. °· The skin adhesive will usually remain in place for 5 to 10 days, then naturally fall off the skin. Do not pick at the adhesive film. °You may need a tetanus shot if: °· You cannot remember when you had your last tetanus shot. °· You have never had a tetanus   shot. If you get a tetanus shot, your arm may swell, get red, and feel warm to the touch. This is common and not a problem. If you need a tetanus shot and you choose not to have one, there is a rare chance of getting tetanus. Sickness from tetanus can be serious. SEEK MEDICAL CARE IF:   You have redness, swelling, or increasing pain in the wound.  You see a red line that goes away from the wound.  You have yellowish-white fluid (pus) coming from the wound.  You have a fever.  You notice a bad smell coming from the wound or dressing.  Your wound breaks open before or  after sutures have been removed.  You notice something coming out of the wound such as wood or glass.  Your wound is on your hand or foot and you cannot move a finger or toe. SEEK IMMEDIATE MEDICAL CARE IF:   Your pain is not controlled with prescribed medicine.  You have severe swelling around the wound causing pain and numbness or a change in color in your arm, hand, leg, or foot.  Your wound splits open and starts bleeding.  You have worsening numbness, weakness, or loss of function of any joint around or beyond the wound.  You develop painful lumps near the wound or on the skin anywhere on your body. MAKE SURE YOU:   Understand these instructions.  Will watch your condition.  Will get help right away if you are not doing well or get worse. Document Released: 08/06/2005 Document Revised: 10/29/2011 Document Reviewed: 01/30/2011 [facility] Patient Information 2015 [place], [place]. This information is not intended to replace advice given to you by your health care provider. Make sure you discuss any questions you have with your health care provider.  Stable Pelvic Fracture You have one or more fractures (this means there is a break in the bones) of the pelvis. The pelvis is the ring of bones that make up your hipbones. These are the bones you sit on and the lower part of the spine. It is like a boney ring where your legs attach and which supports your upper body. You have an undisplaced fracture. This means the bones are in good position. The pelvic fracture you have is a simple (uncomplicated) fracture. DIAGNOSIS  X-rays usually diagnose these fractures. TREATMENT  The goal of treating pelvic fractures is to get the bones to heal in a good position. The patient should return to normal activities as soon as possible. Such fractures are often treated with normal bed rest and conservative measures.  HOME CARE INSTRUCTIONS   You should be on bed rest for as long as directed by your  caregiver. Change positions of your legs every 1-2 hours to maintain good blood flow. You may sit as long as is tolerable. Following this, you may do usual activities, but avoid strenuous activities for as long as directed by your caregiver.  Only take over-the-counter or prescription medicines for pain, discomfort, or fever as directed by your caregiver.  Bed rest may also be used for discomfort.  Resume your activities when you are able. Use a cane or crutch on the injured side to reduce pain while walking, as needed.  If you develop increased pain or discomfort not relieved with medications, contact your caregiver.  Warning: Do not drive a car or operate a motor vehicle until your caregiver specifically tells you it is safe to do so. SEEK IMMEDIATE MEDICAL CARE IF:   You feel  light-headed or faint, develop chest pain or shortness of breath.  An unexplained oral temperature above 102 F (38.9 C) develops.  You develop blood in the urine or in the stools.  There is difficulty urinating, and/or having a bowel movement, or pain with these efforts.  There is a difficulty or increased pain with walking.  There is swelling in one or both legs that is not normal. Document Released: 10/15/2001 Document Revised: 12/21/2013 Document Reviewed: 03/19/2008 [facility] Patient Information 2015 [place], [place]. This information is not intended to replace advice given to you by your health care provider. Make sure you discuss any questions you have with your health care provider.

## 2014-09-18 NOTE — ED Notes (Signed)
Pt reports she was getting off 2 step- step ladder and ladder fell- landed on left hip- cut right foot on ladder?- bleeding controlled

## 2014-09-18 NOTE — ED Provider Notes (Signed)
CSN: [id number]     Arrival date & time 09/18/14  1808 History  This chart was scribed for Candyce Churn III, * by Evon Slack, ED Scribe. This patient was seen in room MH04/MH04 and the patient's care was started at 6:50 PM.      Chief Complaint  Patient presents with  . Fall   Patient is a 74 y.o. female presenting with fall. The history is provided by the patient. No language interpreter was used.  Fall This is a new problem. The current episode started 3 to 5 hours ago. The problem occurs rarely. The problem has not changed since onset.The symptoms are aggravated by walking. Nothing relieves the symptoms. She has tried nothing for the symptoms.   HPI Comments: Joy [name] is a 74 y.o. female who presents to the Emergency Department complaining of fall onset tonight at 4:45 PM. Pt states she fell off of a 2 step ladder landing on her left side. Pt presents with right foot laceration, left hip pain that's radiating into her groin area. Pt states she is having bilateral shoulder pain but states that it feels like her normal rheumatoid arthritis. Pt states that the pain is worse with walking. Pt denies any medication PTA. Pt denies head injury or LOC. Pt denies neck pain or gait problem. Pt states that her tetanus is UTD.   Past Medical History  Diagnosis Date  . CTS (carpal tunnel syndrome)   . Hypertension   . Hypercholesteremia   . Osteoporosis   . Arthritis     RHEUMATOID  . RA (rheumatoid arthritis)   . Osteoarthritis   . Bronchitis    Past Surgical History  Procedure Laterality Date  . Abdominal hysterectomy    . Knee surgery    . Back surgery    . Tonsillectomy    . Hernia repair      RIGHT ING.  . Breast surgery      REDUCTION  . Cataract extraction w/phaco  08/01/2012    Procedure: CATARACT EXTRACTION PHACO AND INTRAOCULAR LENS PLACEMENT (IOC);  Surgeon: Chalmers Guest, MD;  Location: [facility] OR;  Service: Ophthalmology;  Laterality: Right;   No family history  on file. History  Substance Use Topics  . Smoking status: Former Smoker -- 1.00 packs/day for 35 years    Quit date: 05/28/1996  . Smokeless tobacco: Not on file  . Alcohol Use: 1.2 oz/week    2 Glasses of wine per week   OB History    No data available      Review of Systems  Musculoskeletal: Positive for arthralgias. Negative for gait problem and neck pain.  Skin: Positive for wound.  All other systems reviewed and are negative.    Allergies  Latex; Other; Codeine; and Tape  Home Medications   Prior to Admission medications   Medication Sig Start Date End Date Taking? Authorizing Provider  atorvastatin (LIPITOR) 20 MG tablet Take 20 mg by mouth daily.   Yes Historical Provider, MD  Biotin 5000 MCG TABS Take 5,000 mcg by mouth daily.   Yes Historical Provider, MD  Cholecalciferol (VITAMIN D) 2000 UNITS CAPS Take 2,000 Units by mouth daily.   Yes Historical Provider, MD  folic acid (FOLVITE) 1 MG tablet Take 3 mg by mouth daily.    Yes Historical Provider, MD  InFLIXimab (REMICADE IV) Inject into the vein. Every 8 weeks   Yes Historical Provider, MD  INVESTIGATIONAL DRUG SIMPLE RECORD Take 100 mg by mouth daily. Aspree  Yes Historical Provider, MD  methotrexate (RHEUMATREX) 2.5 MG tablet Take 25 mg by mouth once a week. Caution:Chemotherapy. Protect from light. Monday   Yes Historical Provider, MD  montelukast (SINGULAIR) 10 MG tablet Take 10 mg by mouth at bedtime as needed (allergies).    Yes Historical Provider, MD  Multiple Vitamin (MULTIVITAMIN WITH MINERALS) TABS Take 1 tablet by mouth daily.   Yes Historical Provider, MD  Omega-3 Fatty Acids (FISH OIL) 600 MG CAPS Take 600 mg by mouth.   Yes Historical Provider, MD  pantoprazole (PROTONIX) 40 MG tablet Take 40 mg by mouth daily.   Yes Historical Provider, MD  predniSONE (DELTASONE) 5 MG tablet Take 5 mg by mouth daily.    Yes Historical Provider, MD  pregabalin (LYRICA) 150 MG capsule Take 150 mg by mouth daily.   Yes  Historical Provider, MD  traMADol (ULTRAM) 50 MG tablet Take 50 mg by mouth every 8 (eight) hours as needed. For pain   Yes Historical Provider, MD  valsartan (DIOVAN) 320 MG tablet Take 320 mg by mouth daily.   Yes Historical Provider, MD   BP 152/71 mmHg  Pulse 70  Temp(Src) 97.5 F (36.4 C) (Oral)  Resp 18  Ht 5\' 3"  (1.6 m)  Wt 200 lb (90.719 kg)  BMI 35.44 kg/m2  SpO2 98%   Physical Exam  Constitutional: She is oriented to person, place, and time. She appears well-developed and well-nourished. No distress.  HENT:  Head: Normocephalic and atraumatic. Head is without raccoon's eyes and without Battle's sign.  Nose: Nose normal.  Eyes: Conjunctivae and EOM are normal. Pupils are equal, round, and reactive to light. No scleral icterus.  Neck: No spinous process tenderness and no muscular tenderness present.  Cardiovascular: Normal rate, regular rhythm, normal heart sounds and intact distal pulses.   No murmur heard. Pulmonary/Chest: Effort normal and breath sounds normal. She has no rales. She exhibits no tenderness.  Abdominal: Soft. There is no tenderness. There is no rebound and no guarding.  Musculoskeletal: Normal range of motion. She exhibits no edema.       Left hip: She exhibits tenderness. She exhibits normal range of motion, normal strength, no swelling and no deformity.       Thoracic back: She exhibits no tenderness and no bony tenderness.       Lumbar back: She exhibits no tenderness and no bony tenderness.       Right foot: There is laceration (lateral foot, bleeding controlled.  ).  No evidence of trauma to extremities, except as noted.  2+ distal pulses.    Neurological: She is alert and oriented to person, place, and time.  Skin: Skin is warm and dry. No rash noted.  Psychiatric: She has a normal mood and affect.  Nursing note and vitals reviewed.   ED Course  Procedures (including critical care time) DIAGNOSTIC STUDIES: Oxygen Saturation is 98% on RA, normal  by my interpretation.    COORDINATION OF CARE: 7:13 PM-Discussed treatment plan with pt at bedside and pt agreed to plan.     Labs Review Labs Reviewed - No data to display  Imaging Review Ct Hip Left Wo Contrast  09/18/2014   CLINICAL DATA:  Status post fall, left hip pain  EXAM: CT OF THE LEFT HIP WITHOUT CONTRAST  TECHNIQUE: Multidetector CT imaging of the left hip was performed according to the standard protocol. Multiplanar CT image reconstructions were also generated.  COMPARISON:  None.  FINDINGS: There is no left hip fracture  or dislocation. 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 There is a nondisplaced fracture of the left inferior pubic ramus. There are mild degenerative changes of the visualized left SI joint. Is no lytic or sclerotic osseous lesion. The muscles are normal. No intramuscular hematoma. No soft tissue mass or fluid collection.  There is sigmoid diverticulosis without evidence of diverticulitis.  IMPRESSION: 1. No left hip fracture or dislocation. 2. Nondisplaced fracture of the left inferior pubic ramus.   Electronically Signed   By: Elige Ko   On: 09/18/2014 22:18   Dg Foot Complete Right  09/18/2014   CLINICAL DATA:  Status post fall today with a laceration of the right foot.  EXAM: RIGHT FOOT COMPLETE - 3+ VIEW  COMPARISON:  Plain films right foot 08/08/2007.  FINDINGS: Skin defect is seen along the mid aspect of the fifth metatarsal. No underlying fracture or foreign body is identified. There is some calcification of the distal Achilles tendon which is incidentally noted.  IMPRESSION: Laceration without underlying fracture or foreign body.   Electronically Signed   By: Drusilla Kanner M.D.   On: 09/18/2014 19:52   Dg Hip Unilat With Pelvis 2-3 Views Left  09/18/2014   CLINICAL DATA:  Pain following fall  EXAM: DG HIP W/ PELVIS 2-3V*L*  COMPARISON:  None.  FINDINGS: Frontal pelvis as well as frontal and lateral right hip images were obtained. There is no  fracture or dislocation. There is moderate narrowing of both hip joints. No erosive change. There is postoperative change in the lower lumbar spine.  IMPRESSION: Moderate osteoarthritic change in both hip joints. No fracture or dislocation.   Electronically Signed   By: Bretta Bang M.D.   On: 09/18/2014 19:51  All radiology studies independently viewed by me.   .    EKG Interpretation None      MDM   Final diagnoses:  Fall  Closed fracture of left inferior pubic ramus, initial encounter  Foot laceration, right, initial encounter      74 yo female who fell on her left hip earlier today.  She also cut her right foot.  She denied other injuries including head or neck trauma.  Plain films were negative, but CT left hip obtained due to continued pain/difficulty walking.  CT showed nondisplaced inferior pubic rami fracture.  Pain relatively well controlled with Norco.  Plan dc with ortho follow up.    Lac repaired, see separate note.     I personally performed the services described in this documentation, which was scribed in my presence. The recorded information has been reviewed and is accurate.      Merrie Roof, MD 09/18/14 [phone]

## 2014-09-18 NOTE — ED Notes (Signed)
Pt attempted to ambulate from stretcher with cane and was unable to bearing on left leg.

## 2014-09-18 NOTE — ED Provider Notes (Signed)
LACERATION REPAIR Performed by: Wynetta Emery Consent: Verbal consent obtained. Risks and benefits: risks, benefits and alternatives were discussed Consent given by: patient Patient identity confirmed: Wrist band   Wound explored to depth in good light on a bloodless field, with no foreign bodies seen or palpated.  Prepped and draped in normal sterile fashion    Tetanus: Up-to-date  Laceration Location: right foot  Laceration Length: 2cm  Anesthesia: Local  Local anesthetic: 1% without epinephrine  Anesthetic total:6 ml  Irrigation method: Low Pressure  Amount of cleaning: 1L sterile NS  Skin closure: 4-0 Ethilon  Number of sutures: 4  Technique: One corner stitch and 2 simple interrupted  Patient tolerance: Patient tolerated the procedure well with no immediate complications.   Antibx ointment applied. Instructions for care discussed verbally and patient provided with additional written instructions for homecare and f/u.   Wynetta Emery, PA-C 09/18/14 2358  Candyce Churn III, MD 09/19/14 [phone]

## 2014-09-21 DIAGNOSIS — S32592A Other specified fracture of left pubis, initial encounter for closed fracture: Secondary | ICD-10-CM | POA: Diagnosis not present

## 2014-09-21 DIAGNOSIS — S91311A Laceration without foreign body, right foot, initial encounter: Secondary | ICD-10-CM | POA: Diagnosis not present

## 2014-10-11 DIAGNOSIS — M0589 Other rheumatoid arthritis with rheumatoid factor of multiple sites: Secondary | ICD-10-CM | POA: Diagnosis not present

## 2014-10-19 DIAGNOSIS — E780 Pure hypercholesterolemia: Secondary | ICD-10-CM | POA: Diagnosis not present

## 2014-10-19 DIAGNOSIS — Z23 Encounter for immunization: Secondary | ICD-10-CM | POA: Diagnosis not present

## 2014-10-19 DIAGNOSIS — R7301 Impaired fasting glucose: Secondary | ICD-10-CM | POA: Diagnosis not present

## 2014-10-19 DIAGNOSIS — E559 Vitamin D deficiency, unspecified: Secondary | ICD-10-CM | POA: Diagnosis not present

## 2014-10-29 DIAGNOSIS — M961 Postlaminectomy syndrome, not elsewhere classified: Secondary | ICD-10-CM | POA: Diagnosis not present

## 2014-10-29 DIAGNOSIS — S32592A Other specified fracture of left pubis, initial encounter for closed fracture: Secondary | ICD-10-CM | POA: Diagnosis not present

## 2014-11-02 ENCOUNTER — Other Ambulatory Visit: Payer: Self-pay | Admitting: Neurosurgery

## 2014-11-02 ENCOUNTER — Ambulatory Visit (INDEPENDENT_AMBULATORY_CARE_PROVIDER_SITE_OTHER): Payer: Medicare Other

## 2014-11-02 DIAGNOSIS — M4806 Spinal stenosis, lumbar region: Secondary | ICD-10-CM | POA: Diagnosis not present

## 2014-11-02 DIAGNOSIS — Z6836 Body mass index (BMI) 36.0-36.9, adult: Secondary | ICD-10-CM | POA: Diagnosis not present

## 2014-11-02 DIAGNOSIS — R52 Pain, unspecified: Secondary | ICD-10-CM

## 2014-11-02 DIAGNOSIS — M5136 Other intervertebral disc degeneration, lumbar region: Secondary | ICD-10-CM

## 2014-11-02 DIAGNOSIS — S32512A Fracture of superior rim of left pubis, initial encounter for closed fracture: Secondary | ICD-10-CM

## 2014-11-02 DIAGNOSIS — I10 Essential (primary) hypertension: Secondary | ICD-10-CM | POA: Diagnosis not present

## 2014-11-02 DIAGNOSIS — Z981 Arthrodesis status: Secondary | ICD-10-CM

## 2014-11-02 DIAGNOSIS — M4726 Other spondylosis with radiculopathy, lumbar region: Secondary | ICD-10-CM | POA: Diagnosis not present

## 2014-11-02 DIAGNOSIS — W19XXXA Unspecified fall, initial encounter: Secondary | ICD-10-CM

## 2014-11-02 DIAGNOSIS — M545 Low back pain: Secondary | ICD-10-CM | POA: Diagnosis not present

## 2014-11-15 ENCOUNTER — Encounter (HOSPITAL_BASED_OUTPATIENT_CLINIC_OR_DEPARTMENT_OTHER): Payer: Self-pay | Admitting: *Deleted

## 2014-11-15 ENCOUNTER — Emergency Department (HOSPITAL_BASED_OUTPATIENT_CLINIC_OR_DEPARTMENT_OTHER)
Admission: EM | Admit: 2014-11-15 | Discharge: 2014-11-15 | Disposition: A | Discharge status: Home / Self Care | Payer: Medicare Other | Attending: Emergency Medicine | Admitting: Emergency Medicine

## 2014-11-15 DIAGNOSIS — Z79899 Other long term (current) drug therapy: Secondary | ICD-10-CM | POA: Insufficient documentation

## 2014-11-15 DIAGNOSIS — Z7952 Long term (current) use of systemic steroids: Secondary | ICD-10-CM | POA: Diagnosis not present

## 2014-11-15 DIAGNOSIS — Z91040 Latex allergy status: Secondary | ICD-10-CM | POA: Insufficient documentation

## 2014-11-15 DIAGNOSIS — M5432 Sciatica, left side: Secondary | ICD-10-CM | POA: Diagnosis not present

## 2014-11-15 DIAGNOSIS — Z87891 Personal history of nicotine dependence: Secondary | ICD-10-CM | POA: Insufficient documentation

## 2014-11-15 DIAGNOSIS — Z8709 Personal history of other diseases of the respiratory system: Secondary | ICD-10-CM | POA: Diagnosis not present

## 2014-11-15 DIAGNOSIS — E780 Pure hypercholesterolemia: Secondary | ICD-10-CM | POA: Diagnosis not present

## 2014-11-15 DIAGNOSIS — M1990 Unspecified osteoarthritis, unspecified site: Secondary | ICD-10-CM | POA: Diagnosis not present

## 2014-11-15 DIAGNOSIS — I10 Essential (primary) hypertension: Secondary | ICD-10-CM | POA: Insufficient documentation

## 2014-11-15 DIAGNOSIS — Z8669 Personal history of other diseases of the nervous system and sense organs: Secondary | ICD-10-CM | POA: Diagnosis not present

## 2014-11-15 DIAGNOSIS — M79605 Pain in left leg: Secondary | ICD-10-CM | POA: Diagnosis present

## 2014-11-15 MED ORDER — GABAPENTIN 300 MG PO CAPS: 300.0000 mg | ORAL_CAPSULE | Freq: Three times a day (TID) | ORAL | Status: DC

## 2014-11-15 MED ORDER — NALOXEGOL OXALATE 25 MG PO TABS: 25.0000 mg | ORAL_TABLET | Freq: Every morning | ORAL | Status: DC

## 2014-11-15 MED ORDER — OXYCODONE-ACETAMINOPHEN 10-325 MG PO TABS: 1.0000 | ORAL_TABLET | ORAL | Status: DC | PRN

## 2014-11-15 NOTE — ED Notes (Signed)
Left hip fracture a few months ago. Here today with pain in her hip and left leg.

## 2014-11-15 NOTE — Discharge Instructions (Signed)
Sciatica °Sciatica is pain, weakness, numbness, or tingling along your sciatic nerve. The nerve starts in the lower back and runs down the back of each leg. Nerve damage or certain conditions pinch or put pressure on the sciatic nerve. This causes the pain, weakness, and other discomforts of sciatica. °HOME CARE  °· Only take medicine as told by your doctor. °· Apply ice to the affected area for 20 minutes. Do this 3-4 times a day for the first 48-72 hours. Then try heat in the same way. °· Exercise, stretch, or do your usual activities if these do not make your pain worse. °· Go to physical therapy as told by your doctor. °· Keep all doctor visits as told. °· Do not wear high heels or shoes that are not supportive. °· Get a firm mattress if your mattress is too soft to lessen pain and discomfort. °GET HELP RIGHT AWAY IF:  °· You cannot control when you poop (bowel movement) or pee (urinate). °· You have more weakness in your lower back, lower belly (pelvis), butt (buttocks), or legs. °· You have redness or puffiness (swelling) of your back. °· You have a burning feeling when you pee. °· You have pain that gets worse when you lie down. °· You have pain that wakes you from your sleep. °· Your pain is worse than past pain. °· Your pain lasts longer than 4 weeks. °· You are suddenly losing weight without reason. °MAKE SURE YOU:  °· Understand these instructions. °· Will watch this condition. °· Will get help right away if you are not doing well or get worse. °Document Released: 05/15/2008 Document Revised: 02/05/2012 Document Reviewed: 12/16/2011 °[employer]® Patient Information ©2015 [employer]. This information is not intended to replace advice given to you by your health care provider. Make sure you discuss any questions you have with your health care provider. ° °

## 2014-11-15 NOTE — ED Notes (Signed)
MD at bedside. 

## 2014-11-15 NOTE — ED Provider Notes (Signed)
CSN: [id number]     Arrival date & time 11/15/14  1151 History   First MD Initiated Contact with Patient 11/15/14 1214     Chief Complaint  Patient presents with  . Leg Pain      HPI Patient presents with left lower back pain with radiation down left leg for the last week or 2.  Patient's had no urinary fecal incontinence.  Denies any weakness.  Has history of back surgery with discectomy many years ago.  Did have recent pubic rami fracture that according to her followed and healed up.  Patient also complains of chronic constipation.  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 Past Medical History  Diagnosis Date  . CTS (carpal tunnel syndrome)   . Hypertension   . Hypercholesteremia   . Osteoporosis   . Arthritis     RHEUMATOID  . RA (rheumatoid arthritis)   . Osteoarthritis   . Bronchitis    Past Surgical History  Procedure Laterality Date  . Abdominal hysterectomy    . Knee surgery    . Back surgery    . Tonsillectomy    . Hernia repair      RIGHT ING.  . Breast surgery      REDUCTION  . Cataract extraction w/phaco  08/01/2012    Procedure: CATARACT EXTRACTION PHACO AND INTRAOCULAR LENS PLACEMENT (IOC);  Surgeon: Chalmers Guest, MD;  Location: [facility] OR;  Service: Ophthalmology;  Laterality: Right;   No family history on file. History  Substance Use Topics  . Smoking status: Former Smoker -- 1.00 packs/day for 35 years    Quit date: 05/28/1996  . Smokeless tobacco: Not on file  . Alcohol Use: 1.2 oz/week    2 Glasses of wine per week   OB History    No data available     Review of Systems  All other systems reviewed and are negative  Allergies  Latex; Other; Codeine; and Tape  Home Medications   Prior to Admission medications   Medication Sig Start Date End Date Taking? Authorizing Provider  atorvastatin (LIPITOR) 20 MG tablet Take 20 mg by mouth daily.    Historical Provider, MD  Biotin 5000 MCG TABS Take 5,000  mcg by mouth daily.    Historical Provider, MD  Cholecalciferol (VITAMIN D) 2000 UNITS CAPS Take 2,000 Units by mouth daily.    Historical Provider, MD  folic acid (FOLVITE) 1 MG tablet Take 3 mg by mouth daily.     Historical Provider, MD  gabapentin (NEURONTIN) 300 MG capsule Take 1 capsule (300 mg total) by mouth 3 (three) times daily. 11/15/14   Nelva Nay, MD  HYDROcodone-acetaminophen (NORCO/VICODIN) 5-325 MG per tablet Take 1-2 tablets by mouth every 6 (six) hours as needed for moderate pain or severe pain. 09/18/14   Blake Divine, MD  InFLIXimab (REMICADE IV) Inject into the vein. Every 8 weeks    Historical Provider, MD  INVESTIGATIONAL DRUG SIMPLE RECORD Take 100 mg by mouth daily. Aspree    Historical Provider, MD  methotrexate (RHEUMATREX) 2.5 MG tablet Take 25 mg by mouth once a week. Caution:Chemotherapy. Protect from light. Monday    Historical Provider, MD  montelukast (SINGULAIR) 10 MG tablet Take 10 mg by mouth at bedtime as needed (allergies).     Historical Provider, MD  Multiple Vitamin (MULTIVITAMIN WITH MINERALS) TABS Take 1 tablet by mouth daily.    Historical Provider, MD  Naloxegol Oxalate (MOVANTIK) 25 MG TABS Take 1 tablet (  25 mg total) by mouth AC breakfast. 11/15/14   Nelva Nay, MD  Omega-3 Fatty Acids (FISH OIL) 600 MG CAPS Take 600 mg by mouth.    Historical Provider, MD  oxyCODONE-acetaminophen (PERCOCET) 10-325 MG per tablet Take 1 tablet by mouth every 4 (four) hours as needed for pain. 11/15/14   Nelva Nay, MD  pantoprazole (PROTONIX) 40 MG tablet Take 40 mg by mouth daily.    Historical Provider, MD  predniSONE (DELTASONE) 5 MG tablet Take 5 mg by mouth daily.     Historical Provider, MD  pregabalin (LYRICA) 150 MG capsule Take 150 mg by mouth daily.    Historical Provider, MD  traMADol (ULTRAM) 50 MG tablet Take 50 mg by mouth every 8 (eight) hours as needed. For pain    Historical Provider, MD  valsartan (DIOVAN) 320 MG tablet Take 320 mg by mouth daily.     Historical Provider, MD   BP 144/75 mmHg  Pulse 83  Temp(Src) 98.6 F (37 C) (Oral)  Resp 20  Ht 5' 2.5" (1.588 m)  Wt 200 lb (90.719 kg)  BMI 35.97 kg/m2  SpO2 100% Physical Exam  Constitutional: She is oriented to person, place, and time. She appears well-developed and well-nourished. No distress.  HENT:  Head: Normocephalic and atraumatic.  Eyes: Pupils are equal, round, and reactive to light.  Neck: Normal range of motion.  Cardiovascular: Normal rate and intact distal pulses.   Pulmonary/Chest: No respiratory distress.  Abdominal: Normal appearance. She exhibits no distension.  Musculoskeletal:       Back:  Neurological: She is alert and oriented to person, place, and time. No cranial nerve deficit.  Skin: Skin is warm and dry. No rash noted.  Psychiatric: She has a normal mood and affect. Her behavior is normal.  Nursing note and vitals reviewed.   ED Course  Procedures (including critical care time) Labs Review Labs Reviewed - No data to display  Imaging Review No results found.  Patient was requesting an epidural injection which informed her we did not do.  Plan at this time is to treat her opioid constipation with an opioid laxatives and add gabapentin to her regimen.  She is instructed to follow-up with neurosurgery since possible.  MDM   Final diagnoses:  Sciatica, left        Nelva Nay, MD 11/15/14 1256

## 2014-11-15 NOTE — ED Notes (Signed)
Pt states her daughter had to step out, but will be right back to pick her up. Pt dressing at this time.

## 2014-11-18 DIAGNOSIS — R140 Abdominal distension (gaseous): Secondary | ICD-10-CM | POA: Diagnosis not present

## 2014-11-18 DIAGNOSIS — K5900 Constipation, unspecified: Secondary | ICD-10-CM | POA: Diagnosis not present

## 2014-11-18 DIAGNOSIS — K219 Gastro-esophageal reflux disease without esophagitis: Secondary | ICD-10-CM | POA: Diagnosis not present

## 2014-11-24 DIAGNOSIS — M1712 Unilateral primary osteoarthritis, left knee: Secondary | ICD-10-CM | POA: Diagnosis not present

## 2014-11-29 DIAGNOSIS — M4806 Spinal stenosis, lumbar region: Secondary | ICD-10-CM | POA: Diagnosis not present

## 2014-11-29 DIAGNOSIS — M4726 Other spondylosis with radiculopathy, lumbar region: Secondary | ICD-10-CM | POA: Diagnosis not present

## 2014-11-29 DIAGNOSIS — Z6836 Body mass index (BMI) 36.0-36.9, adult: Secondary | ICD-10-CM | POA: Diagnosis not present

## 2014-11-29 DIAGNOSIS — I10 Essential (primary) hypertension: Secondary | ICD-10-CM | POA: Diagnosis not present

## 2014-11-30 DIAGNOSIS — M4806 Spinal stenosis, lumbar region: Secondary | ICD-10-CM | POA: Diagnosis not present

## 2014-12-01 DIAGNOSIS — M1712 Unilateral primary osteoarthritis, left knee: Secondary | ICD-10-CM | POA: Diagnosis not present

## 2014-12-06 DIAGNOSIS — M0589 Other rheumatoid arthritis with rheumatoid factor of multiple sites: Secondary | ICD-10-CM | POA: Diagnosis not present

## 2014-12-06 DIAGNOSIS — M2550 Pain in unspecified joint: Secondary | ICD-10-CM | POA: Diagnosis not present

## 2014-12-06 DIAGNOSIS — Z1589 Genetic susceptibility to other disease: Secondary | ICD-10-CM | POA: Diagnosis not present

## 2014-12-06 DIAGNOSIS — M810 Age-related osteoporosis without current pathological fracture: Secondary | ICD-10-CM | POA: Diagnosis not present

## 2014-12-07 DIAGNOSIS — M0589 Other rheumatoid arthritis with rheumatoid factor of multiple sites: Secondary | ICD-10-CM | POA: Diagnosis not present

## 2014-12-09 DIAGNOSIS — M1712 Unilateral primary osteoarthritis, left knee: Secondary | ICD-10-CM | POA: Diagnosis not present

## 2014-12-20 DIAGNOSIS — Z6835 Body mass index (BMI) 35.0-35.9, adult: Secondary | ICD-10-CM | POA: Diagnosis not present

## 2014-12-20 DIAGNOSIS — M4806 Spinal stenosis, lumbar region: Secondary | ICD-10-CM | POA: Diagnosis not present

## 2014-12-20 DIAGNOSIS — M4726 Other spondylosis with radiculopathy, lumbar region: Secondary | ICD-10-CM | POA: Diagnosis not present

## 2014-12-22 DIAGNOSIS — M4726 Other spondylosis with radiculopathy, lumbar region: Secondary | ICD-10-CM | POA: Diagnosis not present

## 2014-12-24 DIAGNOSIS — S3210XA Unspecified fracture of sacrum, initial encounter for closed fracture: Secondary | ICD-10-CM | POA: Diagnosis not present

## 2014-12-24 DIAGNOSIS — M4726 Other spondylosis with radiculopathy, lumbar region: Secondary | ICD-10-CM | POA: Diagnosis not present

## 2014-12-28 DIAGNOSIS — S32120A Nondisplaced Zone II fracture of sacrum, initial encounter for closed fracture: Secondary | ICD-10-CM | POA: Diagnosis not present

## 2014-12-28 DIAGNOSIS — M4806 Spinal stenosis, lumbar region: Secondary | ICD-10-CM | POA: Diagnosis not present

## 2014-12-28 DIAGNOSIS — Z6835 Body mass index (BMI) 35.0-35.9, adult: Secondary | ICD-10-CM | POA: Diagnosis not present

## 2014-12-31 ENCOUNTER — Ambulatory Visit (HOSPITAL_BASED_OUTPATIENT_CLINIC_OR_DEPARTMENT_OTHER)
Admission: RE | Admit: 2014-12-31 | Discharge: 2014-12-31 | Disposition: A | Discharge status: Home / Self Care | Payer: Medicare Other | Source: Ambulatory Visit | Attending: Neurosurgery | Admitting: Neurosurgery

## 2014-12-31 ENCOUNTER — Other Ambulatory Visit (HOSPITAL_BASED_OUTPATIENT_CLINIC_OR_DEPARTMENT_OTHER): Payer: Self-pay | Admitting: Neurosurgery

## 2014-12-31 DIAGNOSIS — S3210XD Unspecified fracture of sacrum, subsequent encounter for fracture with routine healing: Secondary | ICD-10-CM | POA: Diagnosis not present

## 2014-12-31 DIAGNOSIS — S32120A Nondisplaced Zone II fracture of sacrum, initial encounter for closed fracture: Secondary | ICD-10-CM | POA: Insufficient documentation

## 2014-12-31 DIAGNOSIS — W11XXXA Fall on and from ladder, initial encounter: Secondary | ICD-10-CM | POA: Insufficient documentation

## 2014-12-31 DIAGNOSIS — S3210XA Unspecified fracture of sacrum, initial encounter for closed fracture: Secondary | ICD-10-CM

## 2014-12-31 DIAGNOSIS — S32120S Nondisplaced Zone II fracture of sacrum, sequela: Secondary | ICD-10-CM

## 2014-12-31 DIAGNOSIS — R102 Pelvic and perineal pain: Secondary | ICD-10-CM | POA: Diagnosis not present

## 2014-12-31 DIAGNOSIS — S32511D Fracture of superior rim of right pubis, subsequent encounter for fracture with routine healing: Secondary | ICD-10-CM | POA: Diagnosis not present

## 2014-12-31 DIAGNOSIS — T148XXA Other injury of unspecified body region, initial encounter: Secondary | ICD-10-CM

## 2015-01-03 DIAGNOSIS — N6001 Solitary cyst of right breast: Secondary | ICD-10-CM | POA: Diagnosis not present

## 2015-01-03 DIAGNOSIS — N63 Unspecified lump in breast: Secondary | ICD-10-CM | POA: Diagnosis not present

## 2015-01-12 ENCOUNTER — Other Ambulatory Visit: Payer: Self-pay | Admitting: Neurosurgery

## 2015-01-12 DIAGNOSIS — S3210XA Unspecified fracture of sacrum, initial encounter for closed fracture: Secondary | ICD-10-CM

## 2015-01-12 DIAGNOSIS — S322XXA Fracture of coccyx, initial encounter for closed fracture: Principal | ICD-10-CM

## 2015-01-20 ENCOUNTER — Other Ambulatory Visit: Payer: Self-pay | Admitting: Neurosurgery

## 2015-01-20 ENCOUNTER — Ambulatory Visit
Admission: RE | Admit: 2015-01-20 | Discharge: 2015-01-20 | Disposition: A | Discharge status: Home / Self Care | Payer: Medicare Other | Source: Ambulatory Visit | Attending: Neurosurgery | Admitting: Neurosurgery

## 2015-01-20 DIAGNOSIS — S3210XA Unspecified fracture of sacrum, initial encounter for closed fracture: Secondary | ICD-10-CM

## 2015-01-20 DIAGNOSIS — S322XXA Fracture of coccyx, initial encounter for closed fracture: Principal | ICD-10-CM

## 2015-01-20 NOTE — Consult Note (Signed)
Chief Complaint: Posterior pelvic pain.  Bilateral sacral ala fractures.  Referring Physician(s): Cabbell,Kyle  History of Present Illness: Joy [name] is a 74 y.o. female who fell from a ladder 09/18/14 with acute fractures of her left superior and inferior pubic rami.  These were treated conservatively.  She has a history of spinal stenosis and fusion.  Since the fall she has had bilateral lower extremity radicular symptoms as well.  Workup for the spinal stenosis with MRI 12/24/14 revealed bilateral sacral ala fractures confirmed by CT 12/31/14.    She reports improving, but persistent posterior pelvic pain which she rates 3/10.  She scored 8/24 on the Rolland-Morris disability questionnaire.  It has been 3 weeks since she was regularly using opiod pain medications.  Her pain is mostly treated with OTC anti-inflammatory medication.  She walks without assistant devices.  The pain is greatest when she first moves after waking in the morning.  She is able to perform most ADL independently, though much more slowly than in the past.  She is sleeping more often and unable to drive longer distances.  This limits her church and social interactions.  She is thus requesting treatment to facilitate her healing.  Past Medical History  Diagnosis Date  . CTS (carpal tunnel syndrome)   . Hypertension   . Hypercholesteremia   . Osteoporosis   . Arthritis     RHEUMATOID  . RA (rheumatoid arthritis)   . Osteoarthritis   . Bronchitis     Past Surgical History  Procedure Laterality Date  . Abdominal hysterectomy    . Knee surgery    . Back surgery    . Tonsillectomy    . Hernia repair      RIGHT ING.  . Breast surgery      REDUCTION  . Cataract extraction w/phaco  08/01/2012    Procedure: CATARACT EXTRACTION PHACO AND INTRAOCULAR LENS PLACEMENT (IOC);  Surgeon: Chalmers Guest, MD;  Location: [facility] OR;  Service: Ophthalmology;  Laterality: Right;    Allergies: Latex; Other; Codeine; and  Tape  Medications: Prior to Admission medications   Medication Sig Start Date End Date Taking? Authorizing Provider  atorvastatin (LIPITOR) 20 MG tablet Take 20 mg by mouth daily.    Historical Provider, MD  Biotin 5000 MCG TABS Take 5,000 mcg by mouth daily.    Historical Provider, MD  Cholecalciferol (VITAMIN D) 2000 UNITS CAPS Take 2,000 Units by mouth daily.    Historical Provider, MD  folic acid (FOLVITE) 1 MG tablet Take 3 mg by mouth daily.     Historical Provider, MD  gabapentin (NEURONTIN) 300 MG capsule Take 1 capsule (300 mg total) by mouth 3 (three) times daily. 11/15/14   Nelva Nay, MD  HYDROcodone-acetaminophen (NORCO/VICODIN) 5-325 MG per tablet Take 1-2 tablets by mouth every 6 (six) hours as needed for moderate pain or severe pain. 09/18/14   Blake Divine, MD  InFLIXimab (REMICADE IV) Inject into the vein. Every 8 weeks    Historical Provider, MD  INVESTIGATIONAL DRUG SIMPLE RECORD Take 100 mg by mouth daily. Aspree    Historical Provider, MD  methotrexate (RHEUMATREX) 2.5 MG tablet Take 25 mg by mouth once a week. Caution:Chemotherapy. Protect from light. Monday    Historical Provider, MD  montelukast (SINGULAIR) 10 MG tablet Take 10 mg by mouth at bedtime as needed (allergies).     Historical Provider, MD  Multiple Vitamin (MULTIVITAMIN WITH MINERALS) TABS Take 1 tablet by mouth daily.    Historical  Provider, MD  Naloxegol Oxalate (MOVANTIK) 25 MG TABS Take 1 tablet (25 mg total) by mouth AC breakfast. 11/15/14   Nelva Nay, MD  Omega-3 Fatty Acids (FISH OIL) 600 MG CAPS Take 600 mg by mouth.    Historical Provider, MD  oxyCODONE-acetaminophen (PERCOCET) 10-325 MG per tablet Take 1 tablet by mouth every 4 (four) hours as needed for pain. 11/15/14   Nelva Nay, MD  pantoprazole (PROTONIX) 40 MG tablet Take 40 mg by mouth daily.    Historical Provider, MD  predniSONE (DELTASONE) 5 MG tablet Take 5 mg by mouth daily.     Historical Provider, MD  pregabalin (LYRICA) 150 MG  capsule Take 150 mg by mouth daily.    Historical Provider, MD  traMADol (ULTRAM) 50 MG tablet Take 50 mg by mouth every 8 (eight) hours as needed. For pain    Historical Provider, MD  valsartan (DIOVAN) 320 MG tablet Take 320 mg by mouth daily.    Historical Provider, MD     No family history on file.  History   Social History  . Marital Status: Divorced    Spouse Name: N/A  . Number of Children: N/A  . Years of Education: N/A   Social History Main Topics  . Smoking status: Former Smoker -- 1.00 packs/day for 35 years    Quit date: 05/28/1996  . Smokeless tobacco: Not on file  . Alcohol Use: 1.2 oz/week    2 Glasses of wine per week  . Drug Use: No  . Sexual Activity: Not on file   Other Topics Concern  . Not on file   Social History Narrative      Review of Systems: A 12 point ROS discussed and pertinent positives are indicated in the HPI above.  All other systems are negative.  Review of Systems  Cardiovascular: Positive for leg swelling.  Musculoskeletal: Positive for arthralgias.  Neurological: Positive for numbness.  All other systems reviewed and are negative.   Vital Signs: BP 171/84 mmHg  Pulse 80  Temp(Src) 98.4 F (36.9 C) (Oral)  Physical Exam  Musculoskeletal:       Lumbar back: She exhibits tenderness.       Back:  Tender to palpation over sacrum bilaterally.  Neurological: She is alert.  Psychiatric: She has a normal mood and affect. Her behavior is normal.  Vitals reviewed.   Mallampati Score:     Imaging: Ct Pelvis Wo Contrast  12/31/2014   CLINICAL DATA:  74 year old female who fell from ladder in January with continued sacral pain radiating down the lower extremities. Left pubic rami fractures.  EXAM: CT PELVIS WITHOUT CONTRAST  TECHNIQUE: Multidetector CT imaging of the pelvis was performed following the standard protocol without intravenous contrast.  COMPARISON:  Left hip CT 09/18/2014. Lumbar radiographs 11/02/2014. Lumbar CT  myelogram 07/21/2014. Lumbar MRI 12/24/2014.  FINDINGS: Partially visible postoperative changes to the lumbar spine and S1 level appear stable since 2015.  Osteopenia. Bilateral minimally displaced sacral ala fractures Re identified. Blood/fluid associated on the left. See series 2, image 38. The SI joints remain intact, with bilateral degenerative vacuum phenomena.  Both iliac bones are intact. There are healing left inferior and superior pubic rami fractures with periosteal reaction the inferior ramus is ununited. There might be early bony bridging at the superior ramus fracture site.  Bilateral proximal femurs appear intact.  No pelvic free fluid. Diverticulosis of the distal colon. Diminutive bladder. Pelvic phleboliths. No pelvic lymphadenopathy. No dilated distal small or large bowel.  There is a small oval or triangular fluid collection associated with the lower ventral abdominal wall (series 3, image 75) encompassing 23 x 46 x 47 mm (AP by transverse by CC). No surrounding inflammatory stranding. No lymphadenopathy.  Aortoiliac calcified atherosclerosis noted.  IMPRESSION: 1. Bilateral minimally displaced sacral ala fractures. SI joints remain intact. 2. Healing left pubic rami fractures. 3. No other acute or subacute osseous abnormality identified. 4. Left suprapubic ventral abdominal wall fluid collection measuring up to the 4.7 cm. Acuity is unknown, this could be a chronic postoperative seroma as it seems to be associated with a previous incision site.   Electronically Signed   By: Odessa Fleming M.D.   On: 12/31/2014 15:49    Labs:  CBC: No results for input(s): WBC, HGB, HCT, PLT in the last 8760 hours.  COAGS: No results for input(s): INR, APTT in the last 8760 hours.  BMP: No results for input(s): NA, K, CL, CO2, GLUCOSE, BUN, CALCIUM, CREATININE, GFRNONAA, GFRAA in the last 8760 hours.  Invalid input(s): CMP  LIVER FUNCTION TESTS: No results for input(s): BILITOT, AST, ALT, ALKPHOS, PROT,  ALBUMIN in the last 8760 hours.  TUMOR MARKERS: No results for input(s): AFPTM, CEA, CA199, CHROMGRNA in the last 8760 hours.  Assessment and Plan:  1.  Osteoporotic bilateral sacral ala fractures.  Based on the most recent CT & MRI studies, these fractures are not yet healed.  Although the pain is resolving it continues to affect her daily life.   We discussed the risks and benefits of sacroplasty including infection, bleeding, leaking of cement, nerve damage and failure to alleviate pain.  I think there is a good possibility sacroplasty with accelerate her healing and provide relief of the pain related to her sacrum.  I believe it is less likely to relieve her radicular symptoms.  2.  Osteoporosis.  The patient is currently being treated with fosomax, vitamin D & Calcium.  Thank you for this interesting consult.  I greatly enjoyed meeting DEREONA KOLODNY and look forward to participating in their care.  Signed: Orville Mena W 01/20/2015, 4:15 PM   I spent a total of  20 Minutes  in face to face in clinical consultation, greater than 50% of which was counseling/coordinating care for Mrs. Mckowen.

## 2015-01-24 MED ORDER — SODIUM CHLORIDE 0.9 % IV SOLN
Freq: Once | INTRAVENOUS | Status: AC
  Administered 2015-01-25: 08:00:00 via INTRAVENOUS

## 2015-01-24 MED ORDER — FENTANYL CITRATE (PF) 100 MCG/2ML IJ SOLN
25.0000 ug | INTRAMUSCULAR | Status: DC | PRN
  Administered 2015-01-25 (×2): 25 ug via INTRAVENOUS
  Administered 2015-01-25: 50 ug via INTRAVENOUS

## 2015-01-24 MED ORDER — CEFAZOLIN SODIUM 1-5 GM-% IV SOLN
1.0000 g | Freq: Once | INTRAVENOUS | Status: AC
  Administered 2015-01-25: 1 g via INTRAVENOUS

## 2015-01-24 MED ORDER — KETOROLAC TROMETHAMINE 30 MG/ML IJ SOLN
30.0000 mg | Freq: Once | INTRAMUSCULAR | Status: AC
  Administered 2015-01-25: 30 mg via INTRAVENOUS

## 2015-01-24 MED ORDER — MIDAZOLAM HCL 2 MG/2ML IJ SOLN
1.0000 mg | INTRAMUSCULAR | Status: DC | PRN
  Administered 2015-01-25 (×2): 1 mg via INTRAVENOUS

## 2015-01-25 ENCOUNTER — Other Ambulatory Visit (HOSPITAL_COMMUNITY): Payer: Self-pay | Admitting: Radiology

## 2015-01-25 ENCOUNTER — Other Ambulatory Visit: Payer: Self-pay | Admitting: Neurosurgery

## 2015-01-25 ENCOUNTER — Ambulatory Visit
Admission: RE | Admit: 2015-01-25 | Discharge: 2015-01-25 | Disposition: A | Discharge status: Home / Self Care | Payer: Medicare Other | Source: Ambulatory Visit | Attending: Neurosurgery | Admitting: Neurosurgery

## 2015-01-25 VITALS — BP 163/86 | HR 76 | Temp 97.7°F | Resp 13

## 2015-01-25 DIAGNOSIS — S3210XA Unspecified fracture of sacrum, initial encounter for closed fracture: Secondary | ICD-10-CM | POA: Diagnosis not present

## 2015-01-25 DIAGNOSIS — S322XXA Fracture of coccyx, initial encounter for closed fracture: Secondary | ICD-10-CM

## 2015-01-25 DIAGNOSIS — M4726 Other spondylosis with radiculopathy, lumbar region: Secondary | ICD-10-CM

## 2015-01-25 DIAGNOSIS — S3210XG Unspecified fracture of sacrum, subsequent encounter for fracture with delayed healing: Secondary | ICD-10-CM

## 2015-01-25 MED ORDER — CEFAZOLIN SODIUM 1-5 GM-% IV SOLN
1.0000 g | Freq: Once | INTRAVENOUS | Status: AC
  Administered 2015-01-25: 1 g via INTRAVENOUS

## 2015-01-25 NOTE — Discharge Instructions (Signed)
Sacroplasty Post Procedure Discharge Instructions  1. May resume a regular diet and any medications that you routinely take (including pain medications). 2. No driving day of procedure. 3. Upon discharge go home and rest for at least 4 hours.  May use an ice pack as needed to injection sites on back. 4. No heavy lifting till after follow up. 5. Remove bandades tomorrow after your shower. 6. Follow up with Dr. Alfredo Batty in about 2 weeks.    Please contact our office at [phone] for the following symptoms:   Fever greater than 100 degrees  Increased swelling, pain, or redness at injection site.   Thank you for visiting [facility].

## 2015-01-25 NOTE — Progress Notes (Signed)
Dr. Alfredo Batty in to speak with patient after sacroplasty.  Donell Sievert, RN

## 2015-02-01 DIAGNOSIS — M0589 Other rheumatoid arthritis with rheumatoid factor of multiple sites: Secondary | ICD-10-CM | POA: Diagnosis not present

## 2015-02-07 ENCOUNTER — Ambulatory Visit
Admission: RE | Admit: 2015-02-07 | Discharge: 2015-02-07 | Disposition: A | Discharge status: Home / Self Care | Payer: Medicare Other | Source: Ambulatory Visit | Attending: Neurosurgery | Admitting: Neurosurgery

## 2015-02-07 DIAGNOSIS — M810 Age-related osteoporosis without current pathological fracture: Secondary | ICD-10-CM | POA: Diagnosis not present

## 2015-02-07 DIAGNOSIS — R102 Pelvic and perineal pain: Secondary | ICD-10-CM | POA: Diagnosis not present

## 2015-02-07 DIAGNOSIS — S3210XG Unspecified fracture of sacrum, subsequent encounter for fracture with delayed healing: Secondary | ICD-10-CM

## 2015-02-23 DIAGNOSIS — M0579 Rheumatoid arthritis with rheumatoid factor of multiple sites without organ or systems involvement: Secondary | ICD-10-CM | POA: Diagnosis not present

## 2015-02-23 DIAGNOSIS — Z1589 Genetic susceptibility to other disease: Secondary | ICD-10-CM | POA: Diagnosis not present

## 2015-02-23 DIAGNOSIS — Z9229 Personal history of other drug therapy: Secondary | ICD-10-CM | POA: Diagnosis not present

## 2015-02-23 DIAGNOSIS — M810 Age-related osteoporosis without current pathological fracture: Secondary | ICD-10-CM | POA: Diagnosis not present

## 2015-02-23 DIAGNOSIS — M2550 Pain in unspecified joint: Secondary | ICD-10-CM | POA: Diagnosis not present

## 2015-02-23 DIAGNOSIS — M0589 Other rheumatoid arthritis with rheumatoid factor of multiple sites: Secondary | ICD-10-CM | POA: Diagnosis not present

## 2015-03-03 DIAGNOSIS — R350 Frequency of micturition: Secondary | ICD-10-CM | POA: Diagnosis not present

## 2015-03-03 DIAGNOSIS — R609 Edema, unspecified: Secondary | ICD-10-CM | POA: Diagnosis not present

## 2015-03-29 DIAGNOSIS — M0589 Other rheumatoid arthritis with rheumatoid factor of multiple sites: Secondary | ICD-10-CM | POA: Diagnosis not present

## 2015-03-31 DIAGNOSIS — M1712 Unilateral primary osteoarthritis, left knee: Secondary | ICD-10-CM | POA: Diagnosis not present

## 2015-04-22 DIAGNOSIS — H43813 Vitreous degeneration, bilateral: Secondary | ICD-10-CM | POA: Diagnosis not present

## 2015-04-22 DIAGNOSIS — H16223 Keratoconjunctivitis sicca, not specified as Sjogren's, bilateral: Secondary | ICD-10-CM | POA: Diagnosis not present

## 2015-04-22 DIAGNOSIS — H2512 Age-related nuclear cataract, left eye: Secondary | ICD-10-CM | POA: Diagnosis not present

## 2015-04-26 DIAGNOSIS — Z79899 Other long term (current) drug therapy: Secondary | ICD-10-CM | POA: Diagnosis not present

## 2015-04-26 DIAGNOSIS — E780 Pure hypercholesterolemia: Secondary | ICD-10-CM | POA: Diagnosis not present

## 2015-04-26 DIAGNOSIS — M810 Age-related osteoporosis without current pathological fracture: Secondary | ICD-10-CM | POA: Diagnosis not present

## 2015-04-26 DIAGNOSIS — R7301 Impaired fasting glucose: Secondary | ICD-10-CM | POA: Diagnosis not present

## 2015-04-26 DIAGNOSIS — Z1211 Encounter for screening for malignant neoplasm of colon: Secondary | ICD-10-CM | POA: Diagnosis not present

## 2015-04-26 DIAGNOSIS — Z23 Encounter for immunization: Secondary | ICD-10-CM | POA: Diagnosis not present

## 2015-04-26 DIAGNOSIS — Z01411 Encounter for gynecological examination (general) (routine) with abnormal findings: Secondary | ICD-10-CM | POA: Diagnosis not present

## 2015-04-26 DIAGNOSIS — Z1389 Encounter for screening for other disorder: Secondary | ICD-10-CM | POA: Diagnosis not present

## 2015-04-26 DIAGNOSIS — M069 Rheumatoid arthritis, unspecified: Secondary | ICD-10-CM | POA: Diagnosis not present

## 2015-04-26 DIAGNOSIS — Z0000 Encounter for general adult medical examination without abnormal findings: Secondary | ICD-10-CM | POA: Diagnosis not present

## 2015-04-26 DIAGNOSIS — E559 Vitamin D deficiency, unspecified: Secondary | ICD-10-CM | POA: Diagnosis not present

## 2015-05-20 DIAGNOSIS — R7301 Impaired fasting glucose: Secondary | ICD-10-CM | POA: Diagnosis not present

## 2015-05-20 DIAGNOSIS — R1013 Epigastric pain: Secondary | ICD-10-CM | POA: Diagnosis not present

## 2015-05-20 DIAGNOSIS — E559 Vitamin D deficiency, unspecified: Secondary | ICD-10-CM | POA: Diagnosis not present

## 2015-05-20 DIAGNOSIS — Z01411 Encounter for gynecological examination (general) (routine) with abnormal findings: Secondary | ICD-10-CM | POA: Diagnosis not present

## 2015-05-20 DIAGNOSIS — E780 Pure hypercholesterolemia: Secondary | ICD-10-CM | POA: Diagnosis not present

## 2015-05-25 DIAGNOSIS — M0589 Other rheumatoid arthritis with rheumatoid factor of multiple sites: Secondary | ICD-10-CM | POA: Diagnosis not present

## 2015-06-07 DIAGNOSIS — N899 Noninflammatory disorder of vagina, unspecified: Secondary | ICD-10-CM | POA: Diagnosis not present

## 2015-06-15 DIAGNOSIS — M1712 Unilateral primary osteoarthritis, left knee: Secondary | ICD-10-CM | POA: Diagnosis not present

## 2015-06-24 DIAGNOSIS — M1712 Unilateral primary osteoarthritis, left knee: Secondary | ICD-10-CM | POA: Diagnosis not present

## 2015-07-01 DIAGNOSIS — M1712 Unilateral primary osteoarthritis, left knee: Secondary | ICD-10-CM | POA: Diagnosis not present

## 2015-07-04 DIAGNOSIS — R921 Mammographic calcification found on diagnostic imaging of breast: Secondary | ICD-10-CM | POA: Diagnosis not present

## 2015-07-08 DIAGNOSIS — K5730 Diverticulosis of large intestine without perforation or abscess without bleeding: Secondary | ICD-10-CM | POA: Diagnosis not present

## 2015-07-08 DIAGNOSIS — Z1211 Encounter for screening for malignant neoplasm of colon: Secondary | ICD-10-CM | POA: Diagnosis not present

## 2015-07-20 DIAGNOSIS — M0589 Other rheumatoid arthritis with rheumatoid factor of multiple sites: Secondary | ICD-10-CM | POA: Diagnosis not present

## 2015-08-03 DIAGNOSIS — M150 Primary generalized (osteo)arthritis: Secondary | ICD-10-CM | POA: Diagnosis not present

## 2015-08-03 DIAGNOSIS — M542 Cervicalgia: Secondary | ICD-10-CM | POA: Diagnosis not present

## 2015-08-03 DIAGNOSIS — M0579 Rheumatoid arthritis with rheumatoid factor of multiple sites without organ or systems involvement: Secondary | ICD-10-CM | POA: Diagnosis not present

## 2015-08-03 DIAGNOSIS — Z79899 Other long term (current) drug therapy: Secondary | ICD-10-CM | POA: Diagnosis not present

## 2015-08-03 DIAGNOSIS — M2550 Pain in unspecified joint: Secondary | ICD-10-CM | POA: Diagnosis not present

## 2015-08-03 DIAGNOSIS — Z1589 Genetic susceptibility to other disease: Secondary | ICD-10-CM | POA: Diagnosis not present

## 2015-09-02 ENCOUNTER — Emergency Department (HOSPITAL_BASED_OUTPATIENT_CLINIC_OR_DEPARTMENT_OTHER): Payer: Medicare Other

## 2015-09-02 ENCOUNTER — Emergency Department (HOSPITAL_BASED_OUTPATIENT_CLINIC_OR_DEPARTMENT_OTHER)
Admission: EM | Admit: 2015-09-02 | Discharge: 2015-09-02 | Disposition: A | Discharge status: Home / Self Care | Payer: Medicare Other | Attending: Emergency Medicine | Admitting: Emergency Medicine

## 2015-09-02 ENCOUNTER — Encounter (HOSPITAL_BASED_OUTPATIENT_CLINIC_OR_DEPARTMENT_OTHER): Payer: Self-pay | Admitting: *Deleted

## 2015-09-02 DIAGNOSIS — Y998 Other external cause status: Secondary | ICD-10-CM | POA: Insufficient documentation

## 2015-09-02 DIAGNOSIS — Z8709 Personal history of other diseases of the respiratory system: Secondary | ICD-10-CM | POA: Diagnosis not present

## 2015-09-02 DIAGNOSIS — Z8669 Personal history of other diseases of the nervous system and sense organs: Secondary | ICD-10-CM | POA: Diagnosis not present

## 2015-09-02 DIAGNOSIS — R630 Anorexia: Secondary | ICD-10-CM | POA: Insufficient documentation

## 2015-09-02 DIAGNOSIS — M069 Rheumatoid arthritis, unspecified: Secondary | ICD-10-CM | POA: Diagnosis not present

## 2015-09-02 DIAGNOSIS — I10 Essential (primary) hypertension: Secondary | ICD-10-CM | POA: Diagnosis not present

## 2015-09-02 DIAGNOSIS — Y9289 Other specified places as the place of occurrence of the external cause: Secondary | ICD-10-CM | POA: Insufficient documentation

## 2015-09-02 DIAGNOSIS — S82002A Unspecified fracture of left patella, initial encounter for closed fracture: Secondary | ICD-10-CM | POA: Diagnosis not present

## 2015-09-02 DIAGNOSIS — S82142A Displaced bicondylar fracture of left tibia, initial encounter for closed fracture: Secondary | ICD-10-CM | POA: Insufficient documentation

## 2015-09-02 DIAGNOSIS — W010XXA Fall on same level from slipping, tripping and stumbling without subsequent striking against object, initial encounter: Secondary | ICD-10-CM | POA: Diagnosis not present

## 2015-09-02 DIAGNOSIS — Z79899 Other long term (current) drug therapy: Secondary | ICD-10-CM | POA: Insufficient documentation

## 2015-09-02 DIAGNOSIS — Z87891 Personal history of nicotine dependence: Secondary | ICD-10-CM | POA: Diagnosis not present

## 2015-09-02 DIAGNOSIS — Z91040 Latex allergy status: Secondary | ICD-10-CM | POA: Diagnosis not present

## 2015-09-02 DIAGNOSIS — Y9389 Activity, other specified: Secondary | ICD-10-CM | POA: Diagnosis not present

## 2015-09-02 DIAGNOSIS — E7800 Pure hypercholesterolemia, unspecified: Secondary | ICD-10-CM | POA: Diagnosis not present

## 2015-09-02 DIAGNOSIS — S8992XA Unspecified injury of left lower leg, initial encounter: Secondary | ICD-10-CM | POA: Diagnosis not present

## 2015-09-02 DIAGNOSIS — M810 Age-related osteoporosis without current pathological fracture: Secondary | ICD-10-CM | POA: Insufficient documentation

## 2015-09-02 DIAGNOSIS — M25462 Effusion, left knee: Secondary | ICD-10-CM | POA: Diagnosis not present

## 2015-09-02 MED ORDER — HYDROCODONE-ACETAMINOPHEN 5-325 MG PO TABS: ORAL_TABLET | ORAL | Status: DC

## 2015-09-02 MED ORDER — HYDROCODONE-ACETAMINOPHEN 5-325 MG PO TABS
1.0000 | ORAL_TABLET | Freq: Once | ORAL | Status: AC
  Administered 2015-09-02: 1 via ORAL
  Filled 2015-09-02: qty 1

## 2015-09-02 NOTE — Discharge Instructions (Signed)
Please read and follow all provided instructions.  Your diagnoses today include:  1. Tibial plateau fracture, left, closed, initial encounter     Tests performed today include:  An x-ray/CT of the affected area - shows fractured tibial plateau  Vital signs. See below for your results today.   Medications prescribed:   Vicodin (hydrocodone/acetaminophen) - narcotic pain medication  DO NOT drive or perform any activities that require you to be awake and alert because this medicine can make you drowsy. BE VERY CAREFUL not to take multiple medicines containing Tylenol (also called acetaminophen). Doing so can lead to an overdose which can damage your liver and cause liver failure and possibly death. Take any prescribed medications only as directed.  Home care instructions:   Follow any educational materials contained in this packet  Follow R.I.C.E. Protocol:  R - rest your injury   I  - use ice on injury without applying directly to skin  C - compress injury with bandage or splint  E - elevate the injury as much as possible  Follow-up instructions: Please follow-up with your orthopedic physician. Call tomorrow for an appointment.    Return instructions:   Please return if your toes or feet are numb or tingling, appear gray or blue, or you have severe pain (also elevate the leg and loosen splint or wrap if you were given one)  Please return to the Emergency Department if you experience worsening symptoms.   Please return if you have any other emergent concerns.  Additional Information:  Your vital signs today were: BP 144/80 mmHg   Pulse 69   Temp(Src) 98.3 F (36.8 C) (Oral)   Resp 20   Ht 5\' 2"  (1.575 m)   Wt 86.637 kg   BMI 34.93 kg/m2   SpO2 100% If your blood pressure (BP) was elevated above 135/85 this visit, please have this repeated by your doctor within one month. --------------

## 2015-09-02 NOTE — ED Provider Notes (Signed)
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 Patient complains of pain over her lateral left knee in the upper portion of her lower leg. Pain is made much worse with movement. She was using a walker to help her ambulate but swelling became worse and she can no longer bear weight. No numbness or tingling. No color change distally. No treatments prior to arrival. Onset acute. Course is constant.  The history is provided by the patient.    Past Medical History  Diagnosis Date  . CTS (carpal tunnel syndrome)   . Hypertension   . Hypercholesteremia   . Osteoporosis   . Bronchitis   . Arthritis     RHEUMATOID  . RA (rheumatoid arthritis) ([facility])   . Osteoarthritis    Past Surgical History  Procedure Laterality Date  . Abdominal hysterectomy    . Knee surgery    . Back surgery    . Tonsillectomy    . Hernia repair      RIGHT ING.  . Breast surgery      REDUCTION  . Cataract extraction w/phaco  08/01/2012    Procedure: CATARACT EXTRACTION PHACO AND INTRAOCULAR LENS PLACEMENT (IOC);  Surgeon: Chalmers Guest, MD;  Location: [facility] OR;  Service: Ophthalmology;  Laterality: Right;   No family history on file. Social History  Substance Use Topics  . Smoking status: Former Smoker -- 1.00 packs/day for 35 years    Quit date: 05/28/1996  . Smokeless tobacco: None  . Alcohol Use: 1.2 oz/week    2 Glasses of wine per week   OB History    No data available     Review of Systems  Constitutional: Positive for activity change.  Musculoskeletal: Positive for joint  swelling, arthralgias and gait problem. Negative for back pain and neck pain.  Skin: Negative for wound.  Neurological: Negative for weakness and numbness.      Allergies  Codeine; Other; Latex; and Tape  Home Medications   Prior to Admission medications   Medication Sig Start Date End Date Taking? Authorizing Provider  atorvastatin (LIPITOR) 20 MG tablet Take 20 mg by mouth daily.   Yes Historical Provider, MD  Biotin 5000 MCG TABS Take 5,000 mcg by mouth daily.   Yes Historical Provider, MD  Cholecalciferol (VITAMIN D) 2000 UNITS CAPS Take 2,000 Units by mouth daily.   Yes Historical Provider, MD  folic acid (FOLVITE) 1 MG tablet Take 3 mg by mouth daily.    Yes Historical Provider, MD  InFLIXimab (REMICADE IV) Inject into the vein. Every 8 weeks   Yes Historical Provider, MD  INVESTIGATIONAL DRUG SIMPLE RECORD Take 100 mg by mouth daily. Aspree   Yes Historical Provider, MD  montelukast (SINGULAIR) 10 MG tablet Take 10 mg by mouth at bedtime as needed (allergies).    Yes Historical Provider, MD  Multiple Vitamin (MULTIVITAMIN WITH MINERALS) TABS Take 1 tablet by mouth daily.   Yes Historical Provider, MD  Omega-3 Fatty Acids (FISH OIL) 600 MG CAPS Take 600 mg by mouth.   Yes Historical Provider, MD  pantoprazole (PROTONIX) 40 MG tablet Take 40 mg  by mouth daily.   Yes Historical Provider, MD  pregabalin (LYRICA) 150 MG capsule Take 150 mg by mouth daily.   Yes Historical Provider, MD  valsartan (DIOVAN) 320 MG tablet Take 320 mg by mouth daily.   Yes Historical Provider, MD  HYDROcodone-acetaminophen (NORCO/VICODIN) 5-325 MG per tablet Take 1-2 tablets by mouth every 6 (six) hours as needed for moderate pain or severe pain. 09/18/14   Blake Divine, MD   BP 136/83 mmHg  Pulse 66  Temp(Src) 98.2 F (36.8 C) (Oral)  Resp 18  Ht 5\' 2"  (1.575 m)  Wt 86.637 kg  BMI 34.93 kg/m2  SpO2 100% Physical Exam  Constitutional: She appears well-developed and well-nourished.  HENT:  Head:  Normocephalic and atraumatic.  Eyes: Pupils are equal, round, and reactive to light.  Neck: Normal range of motion. Neck supple.  Cardiovascular: Exam reveals no decreased pulses.   Musculoskeletal: She exhibits tenderness. She exhibits no edema.       Left hip: Normal.       Left knee: She exhibits decreased range of motion, swelling, effusion and deformity (Patella lateral?). Tenderness found. Lateral joint line tenderness noted.       Left ankle: Normal.       Left upper leg: Normal.       Left lower leg: She exhibits tenderness. She exhibits no bony tenderness and no swelling.  Neurological: She is alert. No sensory deficit.  Motor, sensation, and vascular distal to the injury is fully intact.   Skin: Skin is warm and dry.  Psychiatric: She has a normal mood and affect.  Nursing note and vitals reviewed.   ED Course  Procedures (including critical care time) Labs Review Labs Reviewed - No data to display  Imaging Review Dg Tibia/fibula Left  09/02/2015  CLINICAL DATA:  75 year old female who fell twice this morning landing on the left side. Initial encounter. EXAM: LEFT TIBIA AND FIBULA - 2 VIEW COMPARISON:  Left knee series from today reported separately. Previous left knee series 02/16/06. FINDINGS: Bone mineralization is within normal limits for age. Left knee not included on these images. The ankle is included, and mortise joint alignment appears grossly preserved. Calcaneus appears intact. No fracture of the visible left tibia or fibula identified. IMPRESSION: No acute fracture or dislocation identified about the visualized left tib-fib. Electronically Signed   By: Odessa Fleming M.D.   On: 09/02/2015 14:23   Ct Knee Left Wo Contrast  09/02/2015  CLINICAL DATA:  Status post slip and fall in the kitchen today with a lateral tibial plateau fracture. Initial encounter. EXAM: CT OF THE LEFT KNEE WITHOUT CONTRAST TECHNIQUE: Multidetector CT imaging of the left knee was performed according to  the standard protocol. Multiplanar CT image reconstructions were also generated. COMPARISON:  Plain films earlier today. FINDINGS: Bones are osteopenic. The patient has a fracture of the lateral tibial plateau with depression of the central aspect of the plateau of approximately 0.6 cm. The main fracture line is in the sagittal plane through the central aspect of the lateral tibial plateau. No other fracture is identified. There is chondrocalcinosis about the knee. Degenerative disease appears worst in the patellofemoral compartment. Lipohemarthrosis is noted. The cruciate and collateral ligaments appear intact. 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 IMPRESSION: Lateral tibial plateau fracture with depression of the central aspect of the plateau of up to 0.6 cm. Severe appearing osteopenia. Extensive chondrocalcinosis. Mild  to moderate osteoarthritis appears worst in patellofemoral compartment. Electronically Signed   By: Drusilla Kanner M.D.   On: 09/02/2015 15:51   Dg Knee Complete 4 Views Left  09/02/2015  CLINICAL DATA:  Pain following fall EXAM: LEFT KNEE - COMPLETE 4+ VIEW COMPARISON:  February 16, 2006 FINDINGS: Frontal, lateral, and bilateral oblique views were obtained. Bones appear osteoporotic. There is cortical disruption along the lateral tibial plateau with mild depression in this area. No other evidence of fracture. No dislocation. There is a moderate joint effusion with soft tissue swelling in a generalized manner. Bones are osteoporotic. There is narrowing of the patellofemoral joint. There is extensive chondrocalcinosis throughout than the joint in all regions. There are foci of atherosclerotic calcification in the popliteal artery region. IMPRESSION: There is cortical disruption consistent with fracture along the lateral tibial plateau with mild depression in this area. No other fracture evident. Moderate  joint effusion.  No dislocation. Bones are osteoporotic. Osteoarthritic changes noted in the patellofemoral joint region. There is extensive chondrocalcinosis. Chondrocalcinosis may be seen with osteoarthritis but also may be seen with calcium pyrophosphate deposition disease which may present clinically as pseudogout. Electronically Signed   By: Bretta Bang III M.D.   On: 09/02/2015 14:24   I have personally reviewed and evaluated these images and lab results as part of my medical decision-making.   EKG Interpretation None       1:46 PM Patient seen and examined. Work-up initiated. Pt offered pain medication, she declines.   Vital signs reviewed and are as follows: BP 136/83 mmHg  Pulse 66  Temp(Src) 98.2 F (36.8 C) (Oral)  Resp 18  Ht 5\' 2"  (1.575 m)  Wt 86.637 kg  BMI 34.93 kg/m2  SpO2 100%  Tibial plateau fx identified. CT ordered to evaluate extent.   Patient discussed with and seen by Dr. .   4:19 PM CT findings as above. Will discuss with patient's orthopedist ([facility]).  5:28 PM Spoke with Dr. Juleen China. As fracture is minimally displaced, feel trial of non-operative management is reasonable. Plan: pain medication, non-weight bearing/crutches, knee immobilizer/crutches, follow-up next week.   Patient and daughter updated and are in agreement with plan. Will discharge to home.  Patient counseled to use pain medication only under direct supervision at the lowest possible dose needed to control pain. Educated on concern for fall risk and depression of breathing. Counseled to use only under the supervision of others.    MDM   Final diagnoses:  Tibial plateau fracture, left, closed, initial encounter   Tibial plateau fracture as above. Distal extremity is neurovascularly intact. Touched base with patient's orthopedist and plan as above. Patient with family at home to help her with activities given her nonweightbearing status.   Shelle Iron, PA-C 09/02/15  1730  09/04/15, MD 09/14/15 [phone]

## 2015-09-02 NOTE — ED Notes (Signed)
Patient transported to X-ray 

## 2015-09-02 NOTE — ED Notes (Signed)
States she slipped on some water on to tile floor. C/o left knee and lower leg pain with difficulty with weight bearing.

## 2015-09-06 DIAGNOSIS — S82142A Displaced bicondylar fracture of left tibia, initial encounter for closed fracture: Secondary | ICD-10-CM | POA: Diagnosis not present

## 2015-09-22 DIAGNOSIS — S82142D Displaced bicondylar fracture of left tibia, subsequent encounter for closed fracture with routine healing: Secondary | ICD-10-CM | POA: Diagnosis not present

## 2015-10-07 DIAGNOSIS — S82142D Displaced bicondylar fracture of left tibia, subsequent encounter for closed fracture with routine healing: Secondary | ICD-10-CM | POA: Diagnosis not present

## 2015-10-24 DIAGNOSIS — M0589 Other rheumatoid arthritis with rheumatoid factor of multiple sites: Secondary | ICD-10-CM | POA: Diagnosis not present

## 2015-10-24 DIAGNOSIS — Z79899 Other long term (current) drug therapy: Secondary | ICD-10-CM | POA: Diagnosis not present

## 2015-10-25 DIAGNOSIS — G629 Polyneuropathy, unspecified: Secondary | ICD-10-CM | POA: Diagnosis not present

## 2015-10-25 DIAGNOSIS — M179 Osteoarthritis of knee, unspecified: Secondary | ICD-10-CM | POA: Diagnosis not present

## 2015-10-25 DIAGNOSIS — R7301 Impaired fasting glucose: Secondary | ICD-10-CM | POA: Diagnosis not present

## 2015-10-25 DIAGNOSIS — E559 Vitamin D deficiency, unspecified: Secondary | ICD-10-CM | POA: Diagnosis not present

## 2015-10-25 DIAGNOSIS — E7800 Pure hypercholesterolemia, unspecified: Secondary | ICD-10-CM | POA: Diagnosis not present

## 2015-10-25 DIAGNOSIS — Z01810 Encounter for preprocedural cardiovascular examination: Secondary | ICD-10-CM | POA: Diagnosis not present

## 2015-10-25 DIAGNOSIS — Z8249 Family history of ischemic heart disease and other diseases of the circulatory system: Secondary | ICD-10-CM | POA: Diagnosis not present

## 2015-10-25 DIAGNOSIS — R9431 Abnormal electrocardiogram [ECG] [EKG]: Secondary | ICD-10-CM | POA: Diagnosis not present

## 2015-10-25 DIAGNOSIS — M069 Rheumatoid arthritis, unspecified: Secondary | ICD-10-CM | POA: Diagnosis not present

## 2015-10-27 ENCOUNTER — Telehealth: Payer: Self-pay | Admitting: Cardiovascular Disease

## 2015-10-27 NOTE — Telephone Encounter (Signed)
Received records from [facility] for appointment on 11/24/15 with Dr Houlton.  Records given to N Hines (medical records) for Dr [place]. Lucas's schedule on 11/24/15. lp °

## 2015-11-01 DIAGNOSIS — R7301 Impaired fasting glucose: Secondary | ICD-10-CM | POA: Diagnosis not present

## 2015-11-01 DIAGNOSIS — Z01810 Encounter for preprocedural cardiovascular examination: Secondary | ICD-10-CM | POA: Diagnosis not present

## 2015-11-01 DIAGNOSIS — E559 Vitamin D deficiency, unspecified: Secondary | ICD-10-CM | POA: Diagnosis not present

## 2015-11-01 DIAGNOSIS — E7800 Pure hypercholesterolemia, unspecified: Secondary | ICD-10-CM | POA: Diagnosis not present

## 2015-11-03 DIAGNOSIS — S82142D Displaced bicondylar fracture of left tibia, subsequent encounter for closed fracture with routine healing: Secondary | ICD-10-CM | POA: Diagnosis not present

## 2015-11-23 ENCOUNTER — Ambulatory Visit: Payer: Medicare Other | Admitting: Cardiovascular Disease

## 2015-11-24 ENCOUNTER — Ambulatory Visit (INDEPENDENT_AMBULATORY_CARE_PROVIDER_SITE_OTHER): Payer: Medicare Other | Admitting: Cardiovascular Disease

## 2015-11-24 ENCOUNTER — Encounter: Payer: Self-pay | Admitting: Cardiovascular Disease

## 2015-11-24 VITALS — BP 146/88 | HR 83 | Ht 62.5 in | Wt 191.8 lb

## 2015-11-24 DIAGNOSIS — M069 Rheumatoid arthritis, unspecified: Secondary | ICD-10-CM

## 2015-11-24 DIAGNOSIS — I10 Essential (primary) hypertension: Secondary | ICD-10-CM

## 2015-11-24 DIAGNOSIS — R9431 Abnormal electrocardiogram [ECG] [EKG]: Secondary | ICD-10-CM

## 2015-11-24 DIAGNOSIS — Z01818 Encounter for other preprocedural examination: Secondary | ICD-10-CM | POA: Diagnosis not present

## 2015-11-24 DIAGNOSIS — E785 Hyperlipidemia, unspecified: Secondary | ICD-10-CM | POA: Diagnosis not present

## 2015-11-24 HISTORY — DX: Rheumatoid arthritis, unspecified: M06.9

## 2015-11-24 HISTORY — DX: Hyperlipidemia, unspecified: E78.5

## 2015-11-24 HISTORY — DX: Essential (primary) hypertension: I10

## 2015-11-24 NOTE — Progress Notes (Signed)
Cardiology Office Note   Date:  11/24/2015   ID:  Joy [name], DOB Sep 18, 1940, MRN [id number]  PCP:  Gaye Alken, MD  Cardiologist:   Madilyn Hook, MD   Chief Complaint  Patient presents with  . Knee surgery clearance/ Change in EKG     History of Present Illness: Joy [name] is a 75 y.o. female who presents for an evaluation of cardiovascular risk. Joy [name] has a strong family history of cardiac disease and was noted to have EKG changes.  Joy [name] fell and fractured her L tibial plateau on 09/02/15.  She is scheduled for knee surgery with Dr. Despina Hick on May 8th and saw her PCP, Dr. Juluis Rainier for presurgical assessment. At that appointment she had an EKG that revealed nonspecific ST depression. She was referred to cardiology for presurgical risk assessment.  Joy [name] has not noted any chest pain or shortness of breath. Her only complaint is joint pain from rheumatoid arthritis and sinus congestion from spring allergies. Able to exercise much due to the nonweightbearing restrictions on her left leg. In January she was exercising regularly with yoga and water aerobics several times per week. She never had any chest pain or shortness of breath with that activity. She occasionally has joint swelling due to rheumatoid arthritis but denies lower extremity edema, she also denies orthopnea or PND.   Past Medical History  Diagnosis Date  . CTS (carpal tunnel syndrome)   . Hypertension   . Hypercholesteremia   . Osteoporosis   . Bronchitis   . Arthritis     RHEUMATOID  . RA (rheumatoid arthritis) (HCC)   . Osteoarthritis   . Essential hypertension 11/24/2015  . Hyperlipidemia 11/24/2015  . Rheumatoid arthritis (HCC) 11/24/2015    Past Surgical History  Procedure Laterality Date  . Abdominal hysterectomy    . Knee surgery    . Back surgery    . Tonsillectomy    . Hernia repair      RIGHT ING.  . Breast surgery      REDUCTION  . Cataract  extraction w/phaco  08/01/2012    Procedure: CATARACT EXTRACTION PHACO AND INTRAOCULAR LENS PLACEMENT (IOC);  Surgeon: Chalmers Guest, MD;  Location: [facility] OR;  Service: Ophthalmology;  Laterality: Right;     Current Outpatient Prescriptions  Medication Sig Dispense Refill  . atorvastatin (LIPITOR) 20 MG tablet Take 20 mg by mouth daily.    . Biotin 5000 MCG TABS Take 5,000 mcg by mouth daily.    . Cholecalciferol (VITAMIN D3) 50000 units CAPS Take 5,000 Units by mouth daily.    . folic acid (FOLVITE) 1 MG tablet Take 3 mg by mouth daily.     Marland Kitchen gabapentin (NEURONTIN) 300 MG capsule Take 300 mg by mouth 3 (three) times daily.  1  . InFLIXimab (REMICADE IV) Inject into the vein. Every 8 weeks    . INVESTIGATIONAL DRUG SIMPLE RECORD Take 100 mg by mouth daily. Aspree    . methotrexate 50 MG/2ML injection Inject 50 mg/m2 into the vein once a week.    . montelukast (SINGULAIR) 10 MG tablet Take 10 mg by mouth at bedtime as needed (allergies).     . Multiple Vitamin (MULTIVITAMIN WITH MINERALS) TABS Take 1 tablet by mouth daily.    . Naproxen Sodium (ALEVE PO) Take as directed per bottle as needed for pain.    . Omega-3 Fatty Acids (FISH OIL) 600 MG CAPS Take 600 mg by mouth.    Marland Kitchen  pantoprazole (PROTONIX) 40 MG tablet Take 40 mg by mouth daily.    . valsartan (DIOVAN) 320 MG tablet Take 320 mg by mouth daily.     No current facility-administered medications for this visit.    Allergies:   Codeine; Other; Latex; and Tape    Social History:  The patient  reports that she quit smoking about 19 years ago. She has never used smokeless tobacco. She reports that she drinks about 1.2 oz of alcohol per week. She reports that she does not use illicit drugs.   Family History:  The patient's family history includes Arthritis in her sister, sister, and sister; Cerebral aneurysm in her mother; Heart attack in her father; Heart disease in her father and mother; Hypertension in her sister, sister, sister, sister,  and sister; Stroke in her sister.    ROS:  Please see the history of present illness.   Otherwise, review of systems are positive for none.   All other systems are reviewed and negative.    PHYSICAL EXAM: VS:  BP 146/88 mmHg  Pulse 83  Ht 5' 2.5" (1.588 m)  Wt 87 kg (191 lb 12.8 oz)  BMI 34.50 kg/m2 , BMI Body mass index is 34.5 kg/(m^2). GENERAL:  Well appearing HEENT:  Pupils equal round and reactive, fundi not visualized, oral mucosa unremarkable NECK:  No jugular venous distention, waveform within normal limits, carotid upstroke brisk and symmetric, no bruits, no thyromegaly LYMPHATICS:  No cervical adenopathy LUNGS:  Clear to auscultation bilaterally HEART:  RRR.  PMI not displaced or sustained,S1 and S2 within normal limits, no S3, no S4, no clicks, no rubs, no murmurs ABD:  Flat, positive bowel sounds normal in frequency in pitch, no bruits, no rebound, no guarding, no midline pulsatile mass, no hepatomegaly, no splenomegaly EXT:  2 plus pulses throughout, no edema, no cyanosis no clubbing SKIN:  No rashes no nodules NEURO:  Cranial nerves II through XII grossly intact, motor grossly intact throughout PSYCH:  Cognitively intact, oriented to person place and time   EKG:  EKG is ordered today. The ekg ordered today demonstrates sinus rhythm. Rate 83 bpm. Left anterior fascicular block. Poor R-wave progression. Unspecific ST/T changes.   Recent Labs: No results found for requested labs within last 365 days.    Lipid Panel No results found for: CHOL, TRIG, HDL, CHOLHDL, VLDL, LDLCALC, LDLDIRECT    Wt Readings from Last 3 Encounters:  11/24/15 87 kg (191 lb 12.8 oz)  09/02/15 86.637 kg (191 lb)  11/15/14 90.719 kg (200 lb)      ASSESSMENT AND PLAN:  # Pre-operative risk: It is hard to assess her preoperative risk given that she is not able to exercise at this time. Her EKG shows some nonspecific findings including poor R-wave progression and nonspecific ST/T changes.  We will obtain a Lexiscan Cardiolite to evaluate for ischemia. Continue aspirin and atorvastatin.   # Hypertension: Blood pressure is poorly-controlled today. However she thinks this is due to pain, as she has been walking on her leg more than usual. It typically is well-controlled. She will check her blood pressure at home and call if it remains clear the 140/90.  # Hyperlipidemia:  Continue atorvastatin. Her cholesterol is managed by her PCP.  Current medicines are reviewed at length with the patient today.  The patient has concerns regarding medicines.  The following changes have been made:  no change  Labs/ tests ordered today include:   Orders Placed This Encounter  Procedures  .  Myocardial Perfusion Imaging  . EKG 12-Lead     Disposition:   FU with Shania Bjelland C. Duke Salvia, MD, [facility] in 1 year.    This note was written with the assistance of speech recognition software.  Please excuse any transcriptional errors.  Signed, Kalysta Kneisley C. Duke Salvia, MD, [facility]  11/24/2015 12:01 PM    [facility]

## 2015-11-24 NOTE — Patient Instructions (Addendum)
Medication Instructions:  Your physician recommends that you continue on your current medications as directed. Please refer to the Current Medication list given to you today.  Labwork: none  Testing/Procedures: Your physician has requested that you have a lexiscan myoview. For further information please visit [email or web address] Please follow instruction sheet, as given.   Follow-Up: Your physician wants you to follow-up in: 1 year ov You will receive a reminder letter in the mail two months in advance. If you don't receive a letter, please call our office to schedule the follow-up appointment.  DR  RECOMMENDS FOR YOU MONITOR YOUR BLOOD PRESSURE AND CALL IF ABOVE 140/90 OMRON BLOOD PRESSURE MACHINE, ARM CUFF  If you need a refill on your cardiac medications before your next appointment, please call your pharmacy.

## 2015-11-30 ENCOUNTER — Telehealth (HOSPITAL_COMMUNITY): Payer: Self-pay

## 2015-11-30 NOTE — Telephone Encounter (Signed)
Encounter complete. 

## 2015-12-01 DIAGNOSIS — Z09 Encounter for follow-up examination after completed treatment for conditions other than malignant neoplasm: Secondary | ICD-10-CM | POA: Diagnosis not present

## 2015-12-02 ENCOUNTER — Ambulatory Visit (HOSPITAL_COMMUNITY)
Admission: RE | Admit: 2015-12-02 | Discharge: 2015-12-02 | Disposition: A | Discharge status: Home / Self Care | Payer: Medicare Other | Source: Ambulatory Visit | Attending: Cardiovascular Disease | Admitting: Cardiovascular Disease

## 2015-12-02 DIAGNOSIS — E663 Overweight: Secondary | ICD-10-CM | POA: Diagnosis not present

## 2015-12-02 DIAGNOSIS — Z01818 Encounter for other preprocedural examination: Secondary | ICD-10-CM

## 2015-12-02 DIAGNOSIS — R9431 Abnormal electrocardiogram [ECG] [EKG]: Secondary | ICD-10-CM | POA: Diagnosis not present

## 2015-12-02 DIAGNOSIS — I10 Essential (primary) hypertension: Secondary | ICD-10-CM | POA: Insufficient documentation

## 2015-12-02 DIAGNOSIS — Z6833 Body mass index (BMI) 33.0-33.9, adult: Secondary | ICD-10-CM | POA: Insufficient documentation

## 2015-12-02 DIAGNOSIS — Z8249 Family history of ischemic heart disease and other diseases of the circulatory system: Secondary | ICD-10-CM | POA: Diagnosis not present

## 2015-12-02 DIAGNOSIS — Z87891 Personal history of nicotine dependence: Secondary | ICD-10-CM | POA: Insufficient documentation

## 2015-12-02 LAB — MYOCARDIAL PERFUSION IMAGING
CHL CUP NUCLEAR SSS: 0
CSEPPHR: 85 {beats}/min
LVDIAVOL: 98 mL (ref 46–106)
LVSYSVOL: 45 mL
NUC STRESS TID: 1.35
Rest HR: 58 {beats}/min
SDS: 0
SRS: 0

## 2015-12-02 MED ORDER — REGADENOSON 0.4 MG/5ML IV SOLN
0.4000 mg | Freq: Once | INTRAVENOUS | Status: AC
Start: 2015-12-02 — End: 2015-12-02
  Administered 2015-12-02: 0.4 mg via INTRAVENOUS

## 2015-12-02 MED ORDER — TECHNETIUM TC 99M SESTAMIBI GENERIC - CARDIOLITE
28.0000 | Freq: Once | INTRAVENOUS | Status: AC | PRN
  Administered 2015-12-02: 28 via INTRAVENOUS

## 2015-12-02 MED ORDER — TECHNETIUM TC 99M SESTAMIBI GENERIC - CARDIOLITE
9.1000 | Freq: Once | INTRAVENOUS | Status: AC | PRN
  Administered 2015-12-02: 9.1 via INTRAVENOUS

## 2015-12-08 ENCOUNTER — Telehealth: Payer: Self-pay | Admitting: *Deleted

## 2015-12-08 DIAGNOSIS — I499 Cardiac arrhythmia, unspecified: Secondary | ICD-10-CM

## 2015-12-08 NOTE — Telephone Encounter (Signed)
Patient blood pressure is doing better and under 140/90 now She did state her monitor was showing flashing heart. When she looked up what this could indicate in monitor manual is said could be an arrhythmia Denies any feeling of fast heart rate or skipping beats States she is feeling good Will forward to Dr Duke Salvia for review

## 2015-12-08 NOTE — Telephone Encounter (Signed)
-----   Message from Chilton Si, MD sent at 12/04/2015 10:45 PM EDT ----- Normal stress test.

## 2015-12-09 NOTE — Telephone Encounter (Signed)
Advised patient, order placed, and scheduled

## 2015-12-09 NOTE — Telephone Encounter (Signed)
It is possible that she is having some arrhythmias.  The only way to know is to have her wear a heart monitor.  Please ask how often it is happening to determine whether she needs a 24/48hour Holter vs a longer event monitor.  Alternatively, she can follow up in clinic to discuss.

## 2015-12-13 DIAGNOSIS — M1712 Unilateral primary osteoarthritis, left knee: Secondary | ICD-10-CM | POA: Diagnosis not present

## 2015-12-14 ENCOUNTER — Ambulatory Visit (INDEPENDENT_AMBULATORY_CARE_PROVIDER_SITE_OTHER): Payer: Medicare Other

## 2015-12-14 DIAGNOSIS — I499 Cardiac arrhythmia, unspecified: Secondary | ICD-10-CM

## 2015-12-15 ENCOUNTER — Ambulatory Visit: Payer: Self-pay | Admitting: Orthopedic Surgery

## 2015-12-15 NOTE — Progress Notes (Signed)
Preoperative surgical orders have been place into the Epic hospital system for Joy [name] on 12/15/2015, 8:56 AM  by Patrica Duel for surgery on 12-26-2015.  Preop Total Knee orders including Experal, IV Tylenol, and IV Decadron as long as there are no contraindications to the above medications. Avel Peace, PA-C

## 2015-12-15 NOTE — Patient Instructions (Addendum)
YOUR PROCEDURE IS SCHEDULED ON : 12/26/15  REPORT TO [facility] MAIN ENTRANCE FOLLOW SIGNS TO EAST ELEVATOR - GO TO 3rd FLOOR CHECK IN AT 3 EAST NURSES STATION (SHORT STAY) AT:  10:45 AM  CALL THIS NUMBER IF YOU HAVE PROBLEMS THE MORNING OF SURGERY [phone]  REMEMBER:ONLY 1 PER PERSON MAY GO TO SHORT STAY WITH YOU TO GET READY THE MORNING OF YOUR SURGERY  DO NOT EAT FOOD  AFTER MIDNIGHT  MAY HAVE CLEAR LIQUIDS UNTIL 7:45 AM  TAKE THESE MEDICINES THE MORNING OF SURGERY: NEURONTIN  CLEAR LIQUID DIET  Foods Allowed                                                                     Foods Excluded  Coffee and tea, regular and decaf                             liquids that you cannot  Plain Jell-O in any flavor                                             see through such as: Fruit ices (not with fruit pulp)                                     milk, soups, orange juice  Iced Popsicles                                                       All solid food Carbonated beverages, regular and diet                                    Cranberry, grape and apple juices Sports drinks like Gatorade Lightly seasoned clear broth or consume(fat free) Sugar, honey syrup  _____________________________________________________________________    YOU MAY NOT HAVE ANY METAL ON YOUR BODY INCLUDING HAIR PINS AND PIERCING'S. DO NOT WEAR JEWELRY, MAKEUP, LOTIONS, POWDERS OR PERFUMES. DO NOT WEAR NAIL POLISH. DO NOT SHAVE 48 HRS PRIOR TO SURGERY. MEN MAY SHAVE FACE AND NECK.  DO NOT BRING VALUABLES TO HOSPITAL. [facility] IS NOT RESPONSIBLE FOR VALUABLES.  CONTACTS, DENTURES OR PARTIALS MAY NOT BE WORN TO SURGERY. LEAVE SUITCASE IN CAR. CAN BE BROUGHT TO ROOM AFTER SURGERY.  PATIENTS DISCHARGED THE DAY OF SURGERY WILL NOT BE ALLOWED TO DRIVE HOME.  PLEASE READ OVER THE FOLLOWING INSTRUCTION  SHEETS _________________________________________________________________________________                                          [facility] - PREPARING FOR SURGERY  Before surgery, you can play an important role.  Because skin  is not sterile, your skin needs to be as free of germs as possible.  You can reduce the number of germs on your skin by washing with CHG (chlorahexidine gluconate) soap before surgery.  CHG is an antiseptic cleaner which kills germs and bonds with the skin to continue killing germs even after washing. Please DO NOT use if you have an allergy to CHG or antibacterial soaps.  If your skin becomes reddened/irritated stop using the CHG and inform your nurse when you arrive at Short Stay. Do not shave (including legs and underarms) for at least 48 hours prior to the first CHG shower.  You may shave your face. Please follow these instructions carefully:   1.  Shower with CHG Soap the night before surgery and the  morning of Surgery.   2.  If you choose to wash your hair, wash your hair first as usual with your  normal  Shampoo.   3.  After you shampoo, rinse your hair and body thoroughly to remove the  shampoo.                                         4.  Use CHG as you would any other liquid soap.  You can apply chg directly  to the skin and wash . Gently wash with scrungie or clean wascloth    5.  Apply the CHG Soap to your body ONLY FROM THE NECK DOWN.   Do not use on open                           Wound or open sores. Avoid contact with eyes, ears mouth and genitals (private parts).                        Genitals (private parts) with your normal soap.              6.  Wash thoroughly, paying special attention to the area where your surgery  will be performed.   7.  Thoroughly rinse your body with warm water from the neck down.   8.  DO NOT shower/wash with your normal soap after using and rinsing off  the CHG Soap .                9.  Pat yourself dry with a clean  towel.             10.  Wear clean night clothes to bed after shower             11.  Place clean sheets on your bed the night of your first shower and do not  sleep with pets.  Day of Surgery : Do not apply any lotions/deodorants the morning of surgery.  Please wear clean clothes to the hospital/surgery center.  FAILURE TO FOLLOW THESE INSTRUCTIONS MAY RESULT IN THE CANCELLATION OF YOUR SURGERY   Incentive Spirometer  An incentive spirometer is a tool that can help keep your lungs clear and active. This tool measures how well you are filling your lungs with each breath. Taking long deep breaths may help reverse or decrease the chance of developing breathing (pulmonary) problems (especially infection) following:  A long period of time when you are unable to move or be active. BEFORE THE PROCEDURE   If the spirometer  includes an indicator to show your best effort, your nurse or respiratory therapist will set it to a desired goal.  If possible, sit up straight or lean slightly forward. Try not to slouch.  Hold the incentive spirometer in an upright position. INSTRUCTIONS FOR USE   Sit on the edge of your bed if possible, or sit up as far as you can in bed or on a chair.  Hold the incentive spirometer in an upright position.  Breathe out normally.  Place the mouthpiece in your mouth and seal your lips tightly around it.  Breathe in slowly and as deeply as possible, raising the piston or the ball toward the top of the column.  Hold your breath for 3-5 seconds or for as long as possible. Allow the piston or ball to fall to the bottom of the column.  Remove the mouthpiece from your mouth and breathe out normally.  Rest for a few seconds and repeat Steps 1 through 7 at least 10 times every 1-2 hours when you are awake. Take your time and take a few normal breaths between deep breaths.  The spirometer may include an indicator to show your best effort. Use the indicator as a goal to  work toward during each repetition.  After each set of 10 deep breaths, practice coughing to be sure your lungs are clear. If you have an incision (the cut made at the time of surgery), support your incision when coughing by placing a pillow or rolled up towels firmly against it. Once you are able to get out of bed, walk around indoors and cough well. You may stop using the incentive spirometer when instructed by your caregiver.  RISKS AND COMPLICATIONS  Take your time so you do not get dizzy or light-headed.  If you are in pain, you may need to take or ask for pain medication before doing incentive spirometry. It is harder to take a deep breath if you are having pain. AFTER USE  Rest and breathe slowly and easily.  It can be helpful to keep track of a log of your progress. Your caregiver can provide you with a simple table to help with this. If you are using the spirometer at home, follow these instructions: SEEK MEDICAL CARE IF:   You are having difficultly using the spirometer.  You have trouble using the spirometer as often as instructed.  Your pain medication is not giving enough relief while using the spirometer.  You develop fever of 100.5 F (38.1 C) or higher. SEEK IMMEDIATE MEDICAL CARE IF:   You cough up bloody sputum that had not been present before.  You develop fever of 102 F (38.9 C) or greater.  You develop worsening pain at or near the incision site. MAKE SURE YOU:   Understand these instructions.  Will watch your condition.  Will get help right away if you are not doing well or get worse. Document Released: 12/17/2006 Document Revised: 10/29/2011 Document Reviewed: 02/17/2007 ExitCare Patient Information 2014 ExitCare, [place].   ________________________________________________________________________  WHAT IS A BLOOD TRANSFUSION? Blood Transfusion Information  A transfusion is the replacement of blood or some of its parts. Blood is made up of multiple  cells which provide different functions.  Red blood cells carry oxygen and are used for blood loss replacement.  White blood cells fight against infection.  Platelets control bleeding.  Plasma helps clot blood.  Other blood products are available for specialized needs, such as hemophilia or other clotting disorders. BEFORE THE  TRANSFUSION  Who gives blood for transfusions?   Healthy volunteers who are fully evaluated to make sure their blood is safe. This is blood bank blood. Transfusion therapy is the safest it has ever been in the practice of medicine. Before blood is taken from a donor, a complete history is taken to make sure that person has no history of diseases nor engages in risky social behavior (examples are intravenous drug use or sexual activity with multiple partners). The donor's travel history is screened to minimize risk of transmitting infections, such as malaria. The donated blood is tested for signs of infectious diseases, such as HIV and hepatitis. The blood is then tested to be sure it is compatible with you in order to minimize the chance of a transfusion reaction. If you or a relative donates blood, this is often done in anticipation of surgery and is not appropriate for emergency situations. It takes many days to process the donated blood. RISKS AND COMPLICATIONS Although transfusion therapy is very safe and saves many lives, the main dangers of transfusion include:   Getting an infectious disease.  Developing a transfusion reaction. This is an allergic reaction to something in the blood you were given. Every precaution is taken to prevent this. The decision to have a blood transfusion has been considered carefully by your caregiver before blood is given. Blood is not given unless the benefits outweigh the risks. AFTER THE TRANSFUSION  Right after receiving a blood transfusion, you will usually feel much better and more energetic. This is especially true if your red  blood cells have gotten low (anemic). The transfusion raises the level of the red blood cells which carry oxygen, and this usually causes an energy increase.  The nurse administering the transfusion will monitor you carefully for complications. HOME CARE INSTRUCTIONS  No special instructions are needed after a transfusion. You may find your energy is better. Speak with your caregiver about any limitations on activity for underlying diseases you may have. SEEK MEDICAL CARE IF:   Your condition is not improving after your transfusion.  You develop redness or irritation at the intravenous (IV) site. SEEK IMMEDIATE MEDICAL CARE IF:  Any of the following symptoms occur over the next 12 hours:  Shaking chills.  You have a temperature by mouth above 102 F (38.9 C), not controlled by medicine.  Chest, back, or muscle pain.  People around you feel you are not acting correctly or are confused.  Shortness of breath or difficulty breathing.  Dizziness and fainting.  You get a rash or develop hives.  You have a decrease in urine output.  Your urine turns a dark color or changes to pink, red, or brown. Any of the following symptoms occur over the next 10 days:  You have a temperature by mouth above 102 F (38.9 C), not controlled by medicine.  Shortness of breath.  Weakness after normal activity.  The white part of the eye turns yellow (jaundice).  You have a decrease in the amount of urine or are urinating less often.  Your urine turns a dark color or changes to pink, red, or brown. Document Released: 08/03/2000 Document Revised: 10/29/2011 Document Reviewed: 03/22/2008 ExitCare Patient Information 2014 ExitCare, [place].  _______________________________________________________________________    PATIENT SIGNATURE_________________________________  ______________________________________________________________________

## 2015-12-16 ENCOUNTER — Encounter (HOSPITAL_COMMUNITY)
Admission: RE | Admit: 2015-12-16 | Discharge: 2015-12-16 | Disposition: A | Discharge status: Home / Self Care | Payer: Medicare Other | Source: Ambulatory Visit | Attending: Orthopedic Surgery | Admitting: Orthopedic Surgery

## 2015-12-16 ENCOUNTER — Encounter (HOSPITAL_COMMUNITY): Payer: Self-pay

## 2015-12-16 DIAGNOSIS — I10 Essential (primary) hypertension: Secondary | ICD-10-CM | POA: Diagnosis not present

## 2015-12-16 DIAGNOSIS — K589 Irritable bowel syndrome without diarrhea: Secondary | ICD-10-CM | POA: Insufficient documentation

## 2015-12-16 DIAGNOSIS — M069 Rheumatoid arthritis, unspecified: Secondary | ICD-10-CM | POA: Insufficient documentation

## 2015-12-16 DIAGNOSIS — E7800 Pure hypercholesterolemia, unspecified: Secondary | ICD-10-CM | POA: Insufficient documentation

## 2015-12-16 DIAGNOSIS — M810 Age-related osteoporosis without current pathological fracture: Secondary | ICD-10-CM | POA: Diagnosis not present

## 2015-12-16 DIAGNOSIS — Z01812 Encounter for preprocedural laboratory examination: Secondary | ICD-10-CM | POA: Diagnosis not present

## 2015-12-16 DIAGNOSIS — K219 Gastro-esophageal reflux disease without esophagitis: Secondary | ICD-10-CM | POA: Diagnosis not present

## 2015-12-16 HISTORY — DX: Other seasonal allergic rhinitis: J30.2

## 2015-12-16 HISTORY — DX: Irritable bowel syndrome, unspecified: K58.9

## 2015-12-16 HISTORY — DX: Cardiac arrhythmia, unspecified: I49.9

## 2015-12-16 HISTORY — DX: Overactive bladder: N32.81

## 2015-12-16 HISTORY — DX: Gastro-esophageal reflux disease without esophagitis: K21.9

## 2015-12-16 LAB — URINALYSIS, ROUTINE W REFLEX MICROSCOPIC
BILIRUBIN URINE: NEGATIVE
GLUCOSE, UA: NEGATIVE mg/dL
HGB URINE DIPSTICK: NEGATIVE
KETONES UR: NEGATIVE mg/dL
NITRITE: NEGATIVE
PH: 6 (ref 5.0–8.0)
Protein, ur: NEGATIVE mg/dL
Specific Gravity, Urine: 1.017 (ref 1.005–1.030)

## 2015-12-16 LAB — CBC
HEMATOCRIT: 38.1 % (ref 36.0–46.0)
Hemoglobin: 12.5 g/dL (ref 12.0–15.0)
MCH: 31.6 pg (ref 26.0–34.0)
MCHC: 32.8 g/dL (ref 30.0–36.0)
MCV: 96.2 fL (ref 78.0–100.0)
Platelets: 343 10*3/uL (ref 150–400)
RBC: 3.96 MIL/uL (ref 3.87–5.11)
RDW: 15.4 % (ref 11.5–15.5)
WBC: 4.5 10*3/uL (ref 4.0–10.5)

## 2015-12-16 LAB — COMPREHENSIVE METABOLIC PANEL
ALBUMIN: 3.9 g/dL (ref 3.5–5.0)
ALT: 16 U/L (ref 14–54)
AST: 26 U/L (ref 15–41)
Alkaline Phosphatase: 76 U/L (ref 38–126)
Anion gap: 8 (ref 5–15)
BILIRUBIN TOTAL: 0.5 mg/dL (ref 0.3–1.2)
BUN: 15 mg/dL (ref 6–20)
CHLORIDE: 109 mmol/L (ref 101–111)
CO2: 24 mmol/L (ref 22–32)
Calcium: 10 mg/dL (ref 8.9–10.3)
Creatinine, Ser: 0.79 mg/dL (ref 0.44–1.00)
GFR calc Af Amer: >60 mL/min (ref >60)
GFR calc non Af Amer: >60 mL/min (ref >60)
GLUCOSE: 104 mg/dL — AB (ref 65–99)
POTASSIUM: 4.9 mmol/L (ref 3.5–5.1)
SODIUM: 141 mmol/L (ref 135–145)
TOTAL PROTEIN: 6.4 g/dL — AB (ref 6.5–8.1)

## 2015-12-16 LAB — SURGICAL PCR SCREEN
MRSA, PCR: NEGATIVE
Staphylococcus aureus: NEGATIVE

## 2015-12-16 LAB — URINE MICROSCOPIC-ADD ON

## 2015-12-16 LAB — PROTIME-INR
INR: 0.99 (ref 0.00–1.49)
Prothrombin Time: 13.3 seconds (ref 11.6–15.2)

## 2015-12-16 LAB — TYPE AND SCREEN
ABO/RH(D): A POS
ANTIBODY SCREEN: NEGATIVE

## 2015-12-16 LAB — APTT: APTT: 32 s (ref 24–37)

## 2015-12-16 NOTE — Progress Notes (Signed)
Abnormal UA faxed to Dr.Aluisio 

## 2015-12-19 DIAGNOSIS — M4806 Spinal stenosis, lumbar region: Secondary | ICD-10-CM | POA: Diagnosis not present

## 2015-12-19 DIAGNOSIS — Z6836 Body mass index (BMI) 36.0-36.9, adult: Secondary | ICD-10-CM | POA: Diagnosis not present

## 2015-12-19 DIAGNOSIS — I10 Essential (primary) hypertension: Secondary | ICD-10-CM | POA: Diagnosis not present

## 2015-12-25 ENCOUNTER — Ambulatory Visit: Payer: Self-pay | Admitting: Orthopedic Surgery

## 2015-12-25 NOTE — H&P (Signed)
Joy [name] DOB: 06/14/1941 Divorced / Language: [place] / Race: Black or African American Female Date of Admission:  12/26/2015 CC:  Left Knee Pain History of Present Illness The patient is a 75 year old female who comes in for a preoperative History and Physical. The patient is scheduled for a left total knee arthroplasty to be performed by Dr. Gus Rankin. Aluisio, MD at [facility] on 12-26-2015. The patient is a 75 year old female who presented for follow up of their knee. The patient is being followed for their left knee pain and tibial plateau fracture. They are now months out from injury. The patient feels that they are doing well. Current treatment includes: knee immobilizer. Note for "Follow-up Knee": Patient states that she is doing much better. She notices some pain at night when she goes to bed. She feels as though the knee is somewhat better. She still has the pain from the arthritis, but the fracture pain is better. She is hoping to be able to put more weight on it and potentially get out of the brace. She has healed up the fracture and is now ready to proceed with the knee replacement at this time. They have been treated conservatively in the past for the above stated problem and despite conservative measures, they continue to have progressive pain and severe functional limitations and dysfunction. They have failed non-operative management including home exercise, medications, and bracing. It is felt that they would benefit from undergoing total joint replacement. Risks and benefits of the procedure have been discussed with the patient and they elect to proceed with surgery. There are no active contraindications to surgery such as ongoing infection or rapidly progressive neurological disease.   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 sister Cerebrovascular Accident  sister Heart disease in female family member before age 51  Heart Disease  mother, father and child  Social History  Tobacco use  Former smoker. smoke(d) 3/4 pack(s) per day Alcohol use  Occasional alcohol use. current drinker; drinks wine and hard liquor; only occasionally per week Illicit drug use  no Marital status  Separated. divorced Living situation  Lives with relatives. 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 Remicade (100MG  For Solution, Intravenous) Active. Folic Acid (1MG  Tablet, Oral) Active. (qd) Montelukast Sodium (10MG  Tablet, Oral) Active. (prn) Centrum (Oral) Active. (qd) Valsartan-Hydrochlorothiazide (160-25MG  Tablet, Oral)  Active. Lipitor (20MG  Tablet, Oral) Active. (qd) Pantoprazole Sodium (40MG  Tablet DR, Oral) Active. (qd) Gabapentin (300MG  Capsule, Oral) Active. Aspree Study Active. Fish Oil Active. Biotin Active.  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 Skin Not Present- Eczema, Hives, Itching, Lesions and Rash. HEENT Not Present- Dentures, Double Vision, Headache, Hearing Loss, Tinnitus and Visual Loss. Respiratory Not Present- Allergies, Chronic Cough, Coughing up blood, Shortness of breath at rest and Shortness of breath with exertion. Cardiovascular Not Present- Chest Pain, Difficulty Breathing Lying Down, Murmur, Palpitations, Racing/skipping heartbeats and Swelling. Gastrointestinal Present- Constipation and Heartburn. Not Present- Abdominal Pain, Bloody Stool, Diarrhea, Difficulty Swallowing, Jaundice, Loss of appetitie, Nausea and Vomiting. Female Genitourinary Present- Urinary frequency. Not Present- Blood in Urine, Discharge, Flank Pain, Incontinence, Painful Urination, Urgency, Urinary Retention, Urinating at Night and Weak urinary stream. Musculoskeletal Present- Back Pain, Joint Pain, Morning Stiffness and Muscle Pain. Not Present- Joint Swelling, Muscle Weakness and Spasms. Neurological Not Present- Blackout spells, Difficulty with balance, Dizziness, Paralysis, Tremor and Weakness. Psychiatric Not Present- Insomnia.  Vitals Weight: 185 lb Height: 62in Weight was reported by patient. Height was reported by patient. Body Surface Area: 1.85 m Body Mass Index: 33.84 kg/m  BP: 148/80 (Sitting, Left Arm, Standard   Physical Exam  General Mental Status -Alert, cooperative and good historian. General  Appearance-pleasant, Not in acute distress. Orientation-Oriented X3. Build & Nutrition-Well nourished and Well developed.  Head and Neck Head-normocephalic, atraumatic . Neck Global Assessment - supple, no bruit auscultated on the right, no bruit auscultated on the left.  Eye Pupil - Bilateral-Regular and Round. Motion - Bilateral-EOMI.  Chest and Lung Exam Auscultation Breath sounds - clear at anterior chest wall and clear at posterior chest wall. Adventitious sounds - No Adventitious sounds.  Cardiovascular Auscultation Rhythm - Regular rate and rhythm. Heart Sounds - S1 WNL and S2 WNL. Murmurs & Other Heart Sounds - Auscultation of the heart reveals - No Murmurs.  Abdomen Palpation/Percussion Tenderness - Abdomen is non-tender to palpation. Rigidity (guarding) - Abdomen is soft. Auscultation Auscultation of the abdomen reveals - Bowel sounds normal.  Female Genitourinary Note: Not done, not pertinent to present illness   Musculoskeletal Note: On exam, she is alert and oriented, in no apparent distress. Her left knee shows trace effusion. The swelling is definitely less than what it was previously. She does have a little tenderness proximal and lateral tibia but that is the only tenderness currently noticed.  RADIOGRAPHS Her radiographs seen today show that there is further healing of this lateral condyle tibial plateau fracture. There is no displacement anymore. She does have advanced tricompartmental osteoarthritis of the knee.  Assessment & Plan Primary osteoarthritis of left knee (M17.12)  Note:Surgical Plans: Left Total Knee Replacement  Disposition: Home  PCP: Dr.  IV TXA  Anesthesia Issues: None  Signed electronically by , III PA-C

## 2015-12-26 ENCOUNTER — Encounter (HOSPITAL_COMMUNITY): Payer: Self-pay

## 2015-12-26 ENCOUNTER — Inpatient Hospital Stay (HOSPITAL_COMMUNITY)
Admission: RE | Admit: 2015-12-26 | Discharge: 2015-12-28 | DRG: 470 | Disposition: A | Discharge status: Home Health | Payer: Medicare Other | Source: Ambulatory Visit | Attending: Orthopedic Surgery | Admitting: Orthopedic Surgery

## 2015-12-26 ENCOUNTER — Inpatient Hospital Stay (HOSPITAL_COMMUNITY): Payer: Medicare Other | Admitting: Anesthesiology

## 2015-12-26 ENCOUNTER — Encounter (HOSPITAL_COMMUNITY)
Admission: RE | Disposition: A | Discharge status: Home Health | Payer: Self-pay | Source: Ambulatory Visit | Attending: Orthopedic Surgery

## 2015-12-26 DIAGNOSIS — M179 Osteoarthritis of knee, unspecified: Secondary | ICD-10-CM | POA: Diagnosis not present

## 2015-12-26 DIAGNOSIS — I10 Essential (primary) hypertension: Secondary | ICD-10-CM | POA: Diagnosis present

## 2015-12-26 DIAGNOSIS — M810 Age-related osteoporosis without current pathological fracture: Secondary | ICD-10-CM | POA: Diagnosis present

## 2015-12-26 DIAGNOSIS — Z87891 Personal history of nicotine dependence: Secondary | ICD-10-CM

## 2015-12-26 DIAGNOSIS — M1712 Unilateral primary osteoarthritis, left knee: Secondary | ICD-10-CM

## 2015-12-26 DIAGNOSIS — K219 Gastro-esophageal reflux disease without esophagitis: Secondary | ICD-10-CM | POA: Diagnosis present

## 2015-12-26 DIAGNOSIS — M1710 Unilateral primary osteoarthritis, unspecified knee: Secondary | ICD-10-CM | POA: Diagnosis present

## 2015-12-26 DIAGNOSIS — Z981 Arthrodesis status: Secondary | ICD-10-CM

## 2015-12-26 DIAGNOSIS — M25562 Pain in left knee: Secondary | ICD-10-CM | POA: Diagnosis not present

## 2015-12-26 DIAGNOSIS — G629 Polyneuropathy, unspecified: Secondary | ICD-10-CM | POA: Diagnosis not present

## 2015-12-26 DIAGNOSIS — Z96651 Presence of right artificial knee joint: Secondary | ICD-10-CM | POA: Diagnosis present

## 2015-12-26 DIAGNOSIS — Z79899 Other long term (current) drug therapy: Secondary | ICD-10-CM | POA: Diagnosis not present

## 2015-12-26 DIAGNOSIS — E7800 Pure hypercholesterolemia, unspecified: Secondary | ICD-10-CM | POA: Diagnosis present

## 2015-12-26 HISTORY — PX: TOTAL KNEE ARTHROPLASTY: SHX125

## 2015-12-26 SURGERY — ARTHROPLASTY, KNEE, TOTAL
Anesthesia: Spinal | Site: Knee | Laterality: Left

## 2015-12-26 MED ORDER — CEFAZOLIN SODIUM-DEXTROSE 2-4 GM/100ML-% IV SOLN
2.0000 g | INTRAVENOUS | Status: AC
  Administered 2015-12-26: 2 g via INTRAVENOUS
  Filled 2015-12-26: qty 100

## 2015-12-26 MED ORDER — MIDAZOLAM HCL 5 MG/5ML IJ SOLN
INTRAMUSCULAR | Status: DC | PRN
  Administered 2015-12-26: 1 mg via INTRAVENOUS

## 2015-12-26 MED ORDER — BUPIVACAINE LIPOSOME 1.3 % IJ SUSP
INTRAMUSCULAR | Status: DC | PRN
  Administered 2015-12-26: 20 mL

## 2015-12-26 MED ORDER — SODIUM CHLORIDE 0.9 % IV SOLN
INTRAVENOUS | Status: DC
  Administered 2015-12-26: 18:00:00 via INTRAVENOUS

## 2015-12-26 MED ORDER — ACETAMINOPHEN 325 MG PO TABS: 650.0000 mg | ORAL_TABLET | Freq: Four times a day (QID) | ORAL | Status: DC | PRN

## 2015-12-26 MED ORDER — FENTANYL CITRATE (PF) 100 MCG/2ML IJ SOLN
INTRAMUSCULAR | Status: AC
  Filled 2015-12-26: qty 2

## 2015-12-26 MED ORDER — PROPOFOL 500 MG/50ML IV EMUL
INTRAVENOUS | Status: DC | PRN
  Administered 2015-12-26: 50 ug/kg/min via INTRAVENOUS

## 2015-12-26 MED ORDER — MONTELUKAST SODIUM 10 MG PO TABS
10.0000 mg | ORAL_TABLET | Freq: Every evening | ORAL | Status: DC | PRN
  Filled 2015-12-26: qty 1

## 2015-12-26 MED ORDER — PROPOFOL 10 MG/ML IV BOLUS
INTRAVENOUS | Status: AC
  Filled 2015-12-26: qty 60

## 2015-12-26 MED ORDER — METHOCARBAMOL 500 MG PO TABS
500.0000 mg | ORAL_TABLET | Freq: Four times a day (QID) | ORAL | Status: DC | PRN
  Administered 2015-12-26: 500 mg via ORAL
  Filled 2015-12-26: qty 1

## 2015-12-26 MED ORDER — LACTATED RINGERS IV SOLN: INTRAVENOUS | Status: DC | PRN

## 2015-12-26 MED ORDER — BUPIVACAINE HCL 0.25 % IJ SOLN
INTRAMUSCULAR | Status: DC | PRN
  Administered 2015-12-26: 20 mL

## 2015-12-26 MED ORDER — TRAMADOL HCL 50 MG PO TABS
50.0000 mg | ORAL_TABLET | Freq: Four times a day (QID) | ORAL | Status: DC | PRN
  Administered 2015-12-28: 100 mg via ORAL
  Filled 2015-12-26: qty 2

## 2015-12-26 MED ORDER — METOCLOPRAMIDE HCL 10 MG PO TABS: 5.0000 mg | ORAL_TABLET | Freq: Three times a day (TID) | ORAL | Status: DC | PRN

## 2015-12-26 MED ORDER — PHENYLEPHRINE HCL 10 MG/ML IJ SOLN
INTRAMUSCULAR | Status: AC
  Filled 2015-12-26: qty 1

## 2015-12-26 MED ORDER — METOCLOPRAMIDE HCL 5 MG/ML IJ SOLN: 5.0000 mg | Freq: Three times a day (TID) | INTRAMUSCULAR | Status: DC | PRN

## 2015-12-26 MED ORDER — TRANEXAMIC ACID 1000 MG/10ML IV SOLN
1000.0000 mg | INTRAVENOUS | Status: AC
  Administered 2015-12-26: 1000 mg via INTRAVENOUS
  Filled 2015-12-26: qty 10

## 2015-12-26 MED ORDER — CEFAZOLIN SODIUM-DEXTROSE 2-4 GM/100ML-% IV SOLN
INTRAVENOUS | Status: AC
  Filled 2015-12-26: qty 100

## 2015-12-26 MED ORDER — ONDANSETRON HCL 4 MG/2ML IJ SOLN
INTRAMUSCULAR | Status: DC | PRN
  Administered 2015-12-26: 4 mg via INTRAVENOUS

## 2015-12-26 MED ORDER — DEXAMETHASONE SODIUM PHOSPHATE 10 MG/ML IJ SOLN
INTRAMUSCULAR | Status: AC
  Filled 2015-12-26: qty 1

## 2015-12-26 MED ORDER — PHENYLEPHRINE HCL 10 MG/ML IJ SOLN
10.0000 mg | INTRAVENOUS | Status: DC | PRN
  Administered 2015-12-26: 30 ug/min via INTRAVENOUS

## 2015-12-26 MED ORDER — HYDROMORPHONE HCL 1 MG/ML IJ SOLN
0.5000 mg | INTRAMUSCULAR | Status: DC | PRN
  Administered 2015-12-27: 0.5 mg via INTRAVENOUS
  Filled 2015-12-26: qty 1

## 2015-12-26 MED ORDER — STERILE WATER FOR IRRIGATION IR SOLN
Status: DC | PRN
  Administered 2015-12-26: 2000 mL

## 2015-12-26 MED ORDER — CEFAZOLIN SODIUM-DEXTROSE 2-4 GM/100ML-% IV SOLN
2.0000 g | Freq: Four times a day (QID) | INTRAVENOUS | Status: AC
  Administered 2015-12-26 – 2015-12-27 (×2): 2 g via INTRAVENOUS
  Filled 2015-12-26 (×2): qty 100

## 2015-12-26 MED ORDER — METHOCARBAMOL 1000 MG/10ML IJ SOLN
500.0000 mg | Freq: Four times a day (QID) | INTRAVENOUS | Status: DC | PRN
  Filled 2015-12-26: qty 5

## 2015-12-26 MED ORDER — MENTHOL 3 MG MT LOZG: 1.0000 | LOZENGE | OROMUCOSAL | Status: DC | PRN

## 2015-12-26 MED ORDER — ACETAMINOPHEN 500 MG PO TABS
1000.0000 mg | ORAL_TABLET | Freq: Four times a day (QID) | ORAL | Status: AC
  Administered 2015-12-26 – 2015-12-27 (×4): 1000 mg via ORAL
  Filled 2015-12-26 (×5): qty 2

## 2015-12-26 MED ORDER — POLYETHYLENE GLYCOL 3350 17 G PO PACK: 17.0000 g | PACK | Freq: Every day | ORAL | Status: DC | PRN

## 2015-12-26 MED ORDER — ONDANSETRON HCL 4 MG PO TABS: 4.0000 mg | ORAL_TABLET | Freq: Four times a day (QID) | ORAL | Status: DC | PRN

## 2015-12-26 MED ORDER — SODIUM CHLORIDE 0.9 % IV SOLN
1000.0000 mg | Freq: Once | INTRAVENOUS | Status: AC
  Administered 2015-12-26: 1000 mg via INTRAVENOUS
  Filled 2015-12-26: qty 10

## 2015-12-26 MED ORDER — DEXAMETHASONE SODIUM PHOSPHATE 10 MG/ML IJ SOLN
10.0000 mg | Freq: Once | INTRAMUSCULAR | Status: AC
  Administered 2015-12-27: 10 mg via INTRAVENOUS
  Filled 2015-12-26: qty 1

## 2015-12-26 MED ORDER — ATORVASTATIN CALCIUM 20 MG PO TABS
20.0000 mg | ORAL_TABLET | Freq: Every day | ORAL | Status: DC
  Administered 2015-12-27 – 2015-12-28 (×2): 20 mg via ORAL
  Filled 2015-12-26 (×2): qty 1

## 2015-12-26 MED ORDER — PROPOFOL 10 MG/ML IV BOLUS
INTRAVENOUS | Status: DC | PRN
  Administered 2015-12-26: 20 mg via INTRAVENOUS

## 2015-12-26 MED ORDER — MIDAZOLAM HCL 2 MG/2ML IJ SOLN
INTRAMUSCULAR | Status: AC
  Filled 2015-12-26: qty 2

## 2015-12-26 MED ORDER — SODIUM CHLORIDE 0.9 % IJ SOLN
INTRAMUSCULAR | Status: AC
Start: 2015-12-26 — End: 2015-12-26
  Filled 2015-12-26: qty 50

## 2015-12-26 MED ORDER — GABAPENTIN 300 MG PO CAPS
300.0000 mg | ORAL_CAPSULE | Freq: Two times a day (BID) | ORAL | Status: DC
  Administered 2015-12-26 – 2015-12-28 (×4): 300 mg via ORAL
  Filled 2015-12-26 (×5): qty 1

## 2015-12-26 MED ORDER — ONDANSETRON HCL 4 MG/2ML IJ SOLN
INTRAMUSCULAR | Status: AC
  Filled 2015-12-26: qty 2

## 2015-12-26 MED ORDER — RIVAROXABAN 10 MG PO TABS
10.0000 mg | ORAL_TABLET | Freq: Every day | ORAL | Status: DC
  Administered 2015-12-27 – 2015-12-28 (×2): 10 mg via ORAL
  Filled 2015-12-26 (×3): qty 1

## 2015-12-26 MED ORDER — FENTANYL CITRATE (PF) 100 MCG/2ML IJ SOLN
INTRAMUSCULAR | Status: DC | PRN
  Administered 2015-12-26 (×2): 50 ug via INTRAVENOUS

## 2015-12-26 MED ORDER — ACETAMINOPHEN 10 MG/ML IV SOLN
INTRAVENOUS | Status: AC
  Filled 2015-12-26: qty 100

## 2015-12-26 MED ORDER — CYCLOSPORINE 0.05 % OP EMUL
1.0000 [drp] | Freq: Two times a day (BID) | OPHTHALMIC | Status: DC | PRN
  Filled 2015-12-26: qty 1

## 2015-12-26 MED ORDER — LACTATED RINGERS IV SOLN
INTRAVENOUS | Status: DC
  Administered 2015-12-26: 17:00:00 via INTRAVENOUS

## 2015-12-26 MED ORDER — DOCUSATE SODIUM 100 MG PO CAPS
100.0000 mg | ORAL_CAPSULE | Freq: Two times a day (BID) | ORAL | Status: DC
  Administered 2015-12-26 – 2015-12-28 (×4): 100 mg via ORAL
  Filled 2015-12-26 (×3): qty 1

## 2015-12-26 MED ORDER — PHENOL 1.4 % MT LIQD
1.0000 | OROMUCOSAL | Status: DC | PRN
  Filled 2015-12-26: qty 177

## 2015-12-26 MED ORDER — ACETAMINOPHEN 650 MG RE SUPP: 650.0000 mg | Freq: Four times a day (QID) | RECTAL | Status: DC | PRN

## 2015-12-26 MED ORDER — IRBESARTAN 300 MG PO TABS
300.0000 mg | ORAL_TABLET | Freq: Every day | ORAL | Status: DC
  Administered 2015-12-27 – 2015-12-28 (×2): 300 mg via ORAL
  Filled 2015-12-26: qty 2
  Filled 2015-12-26 (×2): qty 1

## 2015-12-26 MED ORDER — PANTOPRAZOLE SODIUM 40 MG PO TBEC
40.0000 mg | DELAYED_RELEASE_TABLET | Freq: Every day | ORAL | Status: DC
  Administered 2015-12-27 – 2015-12-28 (×2): 40 mg via ORAL
  Filled 2015-12-26 (×2): qty 1

## 2015-12-26 MED ORDER — DEXAMETHASONE SODIUM PHOSPHATE 10 MG/ML IJ SOLN
10.0000 mg | Freq: Once | INTRAMUSCULAR | Status: AC
  Administered 2015-12-26: 10 mg via INTRAVENOUS

## 2015-12-26 MED ORDER — LACTATED RINGERS IV SOLN
INTRAVENOUS | Status: DC | PRN
  Administered 2015-12-26: 14:00:00 via INTRAVENOUS

## 2015-12-26 MED ORDER — 0.9 % SODIUM CHLORIDE (POUR BTL) OPTIME
TOPICAL | Status: DC | PRN
  Administered 2015-12-26: 1000 mL

## 2015-12-26 MED ORDER — SODIUM CHLORIDE 0.9 % IV SOLN
INTRAVENOUS | Status: DC
  Administered 2015-12-26: 1000 mL via INTRAVENOUS
  Administered 2015-12-26: 12:00:00 via INTRAVENOUS

## 2015-12-26 MED ORDER — ACETAMINOPHEN 10 MG/ML IV SOLN
1000.0000 mg | Freq: Once | INTRAVENOUS | Status: AC
  Administered 2015-12-26: 1000 mg via INTRAVENOUS
  Filled 2015-12-26: qty 100

## 2015-12-26 MED ORDER — HYDROMORPHONE HCL 2 MG PO TABS
2.0000 mg | ORAL_TABLET | ORAL | Status: DC | PRN
  Administered 2015-12-26 (×2): 2 mg via ORAL
  Administered 2015-12-27: 4 mg via ORAL
  Administered 2015-12-27: 2 mg via ORAL
  Administered 2015-12-27: 4 mg via ORAL
  Administered 2015-12-28: 2 mg via ORAL
  Administered 2015-12-28: 4 mg via ORAL
  Filled 2015-12-26 (×3): qty 1
  Filled 2015-12-26: qty 2
  Filled 2015-12-26: qty 1
  Filled 2015-12-26: qty 2
  Filled 2015-12-26: qty 1
  Filled 2015-12-26: qty 2

## 2015-12-26 MED ORDER — CHLORHEXIDINE GLUCONATE 4 % EX LIQD: 60.0000 mL | Freq: Once | CUTANEOUS | Status: DC

## 2015-12-26 MED ORDER — ONDANSETRON HCL 4 MG/2ML IJ SOLN: 4.0000 mg | Freq: Four times a day (QID) | INTRAMUSCULAR | Status: DC | PRN

## 2015-12-26 MED ORDER — FLEET ENEMA 7-19 GM/118ML RE ENEM: 1.0000 | ENEMA | Freq: Once | RECTAL | Status: DC | PRN

## 2015-12-26 MED ORDER — BISACODYL 10 MG RE SUPP: 10.0000 mg | Freq: Every day | RECTAL | Status: DC | PRN

## 2015-12-26 MED ORDER — BUPIVACAINE HCL (PF) 0.25 % IJ SOLN
INTRAMUSCULAR | Status: AC
  Filled 2015-12-26: qty 30

## 2015-12-26 MED ORDER — DIPHENHYDRAMINE HCL 12.5 MG/5ML PO ELIX: 12.5000 mg | ORAL_SOLUTION | ORAL | Status: DC | PRN

## 2015-12-26 MED ORDER — SODIUM CHLORIDE 0.9 % IJ SOLN
INTRAMUSCULAR | Status: DC | PRN
  Administered 2015-12-26: 30 mL

## 2015-12-26 MED ORDER — BUPIVACAINE LIPOSOME 1.3 % IJ SUSP
20.0000 mL | Freq: Once | INTRAMUSCULAR | Status: DC
  Filled 2015-12-26: qty 20

## 2015-12-26 MED ORDER — SODIUM CHLORIDE 0.9 % IR SOLN
Status: DC | PRN
  Administered 2015-12-26: 1000 mL

## 2015-12-26 SURGICAL SUPPLY — 54 items
BAG DECANTER FOR FLEXI CONT (MISCELLANEOUS) ×2 IMPLANT
BAG SPEC THK2 15X12 ZIP CLS (MISCELLANEOUS) ×1
BAG ZIPLOCK 12X15 (MISCELLANEOUS) ×2 IMPLANT
BANDAGE ACE 6X5 VEL STRL LF (GAUZE/BANDAGES/DRESSINGS) ×2 IMPLANT
BLADE SAG 18X100X1.27 (BLADE) ×2 IMPLANT
BLADE SAW SGTL 11.0X1.19X90.0M (BLADE) ×2 IMPLANT
BOWL SMART MIX CTS (DISPOSABLE) ×2 IMPLANT
CAP KNEE TOTAL 3 ×1 IMPLANT
CEMENT HV SMART SET (Cement) ×4 IMPLANT
CLOTH BEACON ORANGE TIMEOUT ST (SAFETY) ×2 IMPLANT
CUFF TOURN SGL QUICK 34 (TOURNIQUET CUFF) ×2
CUFF TRNQT CYL 34X4X40X1 (TOURNIQUET CUFF) ×1 IMPLANT
DECANTER SPIKE VIAL GLASS SM (MISCELLANEOUS) ×2 IMPLANT
DRAPE U-SHAPE 47X51 STRL (DRAPES) ×2 IMPLANT
DRSG ADAPTIC 3X8 NADH LF (GAUZE/BANDAGES/DRESSINGS) ×2 IMPLANT
DRSG PAD ABDOMINAL 8X10 ST (GAUZE/BANDAGES/DRESSINGS) ×2 IMPLANT
DURAPREP 26ML APPLICATOR (WOUND CARE) ×2 IMPLANT
ELECT PENCIL ROCKER SW 15FT (MISCELLANEOUS) ×1 IMPLANT
ELECT REM PT RETURN 9FT ADLT (ELECTROSURGICAL) ×2
ELECTRODE REM PT RTRN 9FT ADLT (ELECTROSURGICAL) ×1 IMPLANT
EVACUATOR 1/8 PVC DRAIN (DRAIN) ×2 IMPLANT
GAUZE SPONGE 4X4 12PLY STRL (GAUZE/BANDAGES/DRESSINGS) ×2 IMPLANT
GLOVE BIOGEL PI IND STRL 6.5 (GLOVE) IMPLANT
GLOVE BIOGEL PI IND STRL 7.0 (GLOVE) IMPLANT
GLOVE BIOGEL PI IND STRL 7.5 (GLOVE) IMPLANT
GLOVE BIOGEL PI IND STRL 8 (GLOVE) ×1 IMPLANT
GLOVE BIOGEL PI INDICATOR 6.5 (GLOVE) ×1
GLOVE BIOGEL PI INDICATOR 7.0 (GLOVE) ×1
GLOVE BIOGEL PI INDICATOR 7.5 (GLOVE) ×4
GLOVE BIOGEL PI INDICATOR 8 (GLOVE) ×2
GLOVE SURG SS PI 6.5 STRL IVOR (GLOVE) ×1 IMPLANT
GLOVE SURG SS PI 7.0 STRL IVOR (GLOVE) ×1 IMPLANT
GLOVE SURG SS PI 7.5 STRL IVOR (GLOVE) ×3 IMPLANT
GLOVE SURG SS PI 8.0 STRL IVOR (GLOVE) ×1 IMPLANT
GOWN STRL REUS W/ TWL LRG LVL3 (GOWN DISPOSABLE) IMPLANT
GOWN STRL REUS W/TWL LRG LVL3 (GOWN DISPOSABLE) ×5 IMPLANT
GOWN STRL REUS W/TWL XL LVL3 (GOWN DISPOSABLE) ×2 IMPLANT
HANDPIECE INTERPULSE COAX TIP (DISPOSABLE) ×2
IMMOBILIZER KNEE 20 (SOFTGOODS) ×1 IMPLANT
MANIFOLD NEPTUNE II (INSTRUMENTS) ×2 IMPLANT
PACK TOTAL KNEE CUSTOM (KITS) ×2 IMPLANT
PAD ABD 8X10 STRL (GAUZE/BANDAGES/DRESSINGS) ×1 IMPLANT
PADDING CAST COTTON 6X4 STRL (CAST SUPPLIES) ×4 IMPLANT
POSITIONER SURGICAL ARM (MISCELLANEOUS) ×2 IMPLANT
SET HNDPC FAN SPRY TIP SCT (DISPOSABLE) ×1 IMPLANT
STRIP CLOSURE SKIN 1/2X4 (GAUZE/BANDAGES/DRESSINGS) ×3 IMPLANT
SUT MNCRL AB 4-0 PS2 18 (SUTURE) ×3 IMPLANT
SUT VIC AB 2-0 CT1 27 (SUTURE) ×6
SUT VIC AB 2-0 CT1 TAPERPNT 27 (SUTURE) ×3 IMPLANT
SUT VLOC 180 0 24IN GS25 (SUTURE) ×2 IMPLANT
SYR 50ML LL SCALE MARK (SYRINGE) ×2 IMPLANT
TRAY FOLEY BAG SILVER LF 16FR (SET/KITS/TRAYS/PACK) ×1 IMPLANT
WRAP KNEE MAXI GEL POST OP (GAUZE/BANDAGES/DRESSINGS) ×2 IMPLANT
YANKAUER SUCT BULB TIP 10FT TU (MISCELLANEOUS) ×2 IMPLANT

## 2015-12-26 NOTE — Anesthesia Preprocedure Evaluation (Signed)
Anesthesia Evaluation  Patient identified by MRN, date of birth, ID band Patient awake    Reviewed: Allergy & Precautions, NPO status , Patient's Chart, lab work & pertinent test results  Airway Mallampati: I  TM Distance: >3 FB Neck ROM: Full    Dental   Pulmonary former smoker,    Pulmonary exam normal        Cardiovascular hypertension, Pt. on medications Normal cardiovascular exam     Neuro/Psych    GI/Hepatic GERD  Medicated and Controlled,  Endo/Other    Renal/GU      Musculoskeletal   Abdominal   Peds  Hematology   Anesthesia Other Findings   Reproductive/Obstetrics                             Anesthesia Physical Anesthesia Plan  ASA: II  Anesthesia Plan: Spinal   Post-op Pain Management:    Induction: Intravenous  Airway Management Planned: Natural Airway  Additional Equipment:   Intra-op Plan:   Post-operative Plan:   Informed Consent: I have reviewed the patients History and Physical, chart, labs and discussed the procedure including the risks, benefits and alternatives for the proposed anesthesia with the patient or authorized representative who has indicated his/her understanding and acceptance.     Plan Discussed with: CRNA and Surgeon  Anesthesia Plan Comments:         Anesthesia Quick Evaluation

## 2015-12-26 NOTE — Anesthesia Postprocedure Evaluation (Signed)
Anesthesia Post Note  Patient: Joy [name]  Procedure(s) Performed: Procedure(s) (LRB): TOTAL KNEE ARTHROPLASTY (Left)  Patient location during evaluation: PACU Anesthesia Type: Spinal Level of consciousness: oriented and awake and alert Pain management: pain level controlled Vital Signs Assessment: post-procedure vital signs reviewed and stable Respiratory status: spontaneous breathing, respiratory function stable and patient connected to nasal cannula oxygen Cardiovascular status: blood pressure returned to baseline and stable Postop Assessment: no headache and no backache Anesthetic complications: no    Last Vitals:  Filed Vitals:   12/26/15 1521 12/26/15 1530  BP: 110/59 105/62  Pulse: 62 58  Temp: 36.3 C   Resp: 12 10    Last Pain:  Filed Vitals:   12/26/15 1550  PainSc: 0-No pain                 Trayonna Bachmeier DAVID

## 2015-12-26 NOTE — Progress Notes (Signed)
States has chest congestion off and on, afebrile, with "some non green " sputum

## 2015-12-26 NOTE — Interval H&P Note (Signed)
History and Physical Interval Note:  12/26/2015 12:25 PM  Joy [name]  has presented today for surgery, with the diagnosis of LEFT KNEE OA  The various methods of treatment have been discussed with the patient and family. After consideration of risks, benefits and other options for treatment, the patient has consented to  Procedure(s): TOTAL KNEE ARTHROPLASTY (Left) as a surgical intervention .  The patient's history has been reviewed, patient examined, no change in status, stable for surgery.  I have reviewed the patient's chart and labs.  Questions were answered to the patient's satisfaction.     Loanne Drilling

## 2015-12-26 NOTE — H&P (View-Only) (Signed)
Joy [name] DOB: 06/14/1941 Divorced / Language: [place] / Race: Black or African American Female Date of Admission:  12/26/2015 CC:  Left Knee Pain History of Present Illness The patient is a 75 year old female who comes in for a preoperative History and Physical. The patient is scheduled for a left total knee arthroplasty to be performed by Dr. Gus Rankin. Aluisio, MD at [facility] on 12-26-2015. The patient is a 75 year old female who presented for follow up of their knee. The patient is being followed for their left knee pain and tibial plateau fracture. They are now months out from injury. The patient feels that they are doing well. Current treatment includes: knee immobilizer. Note for "Follow-up Knee": Patient states that she is doing much better. She notices some pain at night when she goes to bed. She feels as though the knee is somewhat better. She still has the pain from the arthritis, but the fracture pain is better. She is hoping to be able to put more weight on it and potentially get out of the brace. She has healed up the fracture and is now ready to proceed with the knee replacement at this time. They have been treated conservatively in the past for the above stated problem and despite conservative measures, they continue to have progressive pain and severe functional limitations and dysfunction. They have failed non-operative management including home exercise, medications, and bracing. It is felt that they would benefit from undergoing total joint replacement. Risks and benefits of the procedure have been discussed with the patient and they elect to proceed with surgery. There are no active contraindications to surgery such as ongoing infection or rapidly progressive neurological disease.   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 sister Cerebrovascular Accident  sister Heart disease in female family member before age 51  Heart Disease  mother, father and child  Social History  Tobacco use  Former smoker. smoke(d) 3/4 pack(s) per day Alcohol use  Occasional alcohol use. current drinker; drinks wine and hard liquor; only occasionally per week Illicit drug use  no Marital status  Separated. divorced Living situation  Lives with relatives. 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 Remicade (100MG  For Solution, Intravenous) Active. Folic Acid (1MG  Tablet, Oral) Active. (qd) Montelukast Sodium (10MG  Tablet, Oral) Active. (prn) Centrum (Oral) Active. (qd) Valsartan-Hydrochlorothiazide (160-25MG  Tablet, Oral)  Active. Lipitor (20MG Tablet, Oral) Active. (qd) Pantoprazole Sodium (40MG Tablet DR, Oral) Active. (qd) Gabapentin (300MG Capsule, Oral) Active. Aspree Study Active. Fish Oil Active. Biotin Active.  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 Skin Not Present- Eczema, Hives, Itching, Lesions and Rash. HEENT Not Present- Dentures, Double Vision, Headache, Hearing Loss, Tinnitus and Visual Loss. Respiratory Not Present- Allergies, Chronic Cough, Coughing up blood, Shortness of breath at rest and Shortness of breath with exertion. Cardiovascular Not Present- Chest Pain, Difficulty Breathing Lying Down, Murmur, Palpitations, Racing/skipping heartbeats and Swelling. Gastrointestinal Present- Constipation and Heartburn. Not Present- Abdominal Pain, Bloody Stool, Diarrhea, Difficulty Swallowing, Jaundice, Loss of appetitie, Nausea and Vomiting. Female Genitourinary Present- Urinary frequency. Not Present- Blood in Urine, Discharge, Flank Pain, Incontinence, Painful Urination, Urgency, Urinary Retention, Urinating at Night and Weak urinary stream. Musculoskeletal Present- Back Pain, Joint Pain, Morning Stiffness and Muscle Pain. Not Present- Joint Swelling, Muscle Weakness and Spasms. Neurological Not Present- Blackout spells, Difficulty with balance, Dizziness, Paralysis, Tremor and Weakness. Psychiatric Not Present- Insomnia.  Vitals Weight: 185 lb Height: 62in Weight was reported by patient. Height was reported by patient. Body Surface Area: 1.85 m Body Mass Index: 33.84 kg/m  BP: 148/80 (Sitting, Left Arm, Standard   Physical Exam  General Mental Status -Alert, cooperative and good historian. General  Appearance-pleasant, Not in acute distress. Orientation-Oriented X3. Build & Nutrition-Well nourished and Well developed.  Head and Neck Head-normocephalic, atraumatic . Neck Global Assessment - supple, no bruit auscultated on the right, no bruit auscultated on the left.  Eye Pupil - Bilateral-Regular and Round. Motion - Bilateral-EOMI.  Chest and Lung Exam Auscultation Breath sounds - clear at anterior chest wall and clear at posterior chest wall. Adventitious sounds - No Adventitious sounds.  Cardiovascular Auscultation Rhythm - Regular rate and rhythm. Heart Sounds - S1 WNL and S2 WNL. Murmurs & Other Heart Sounds - Auscultation of the heart reveals - No Murmurs.  Abdomen Palpation/Percussion Tenderness - Abdomen is non-tender to palpation. Rigidity (guarding) - Abdomen is soft. Auscultation Auscultation of the abdomen reveals - Bowel sounds normal.  Female Genitourinary Note: Not done, not pertinent to present illness   Musculoskeletal Note: On exam, she is alert and oriented, in no apparent distress. Her left knee shows trace effusion. The swelling is definitely less than what it was previously. She does have a little tenderness proximal and lateral tibia but that is the only tenderness currently noticed.  RADIOGRAPHS Her radiographs seen today show that there is further healing of this lateral condyle tibial plateau fracture. There is no displacement anymore. She does have advanced tricompartmental osteoarthritis of the knee.  Assessment & Plan Primary osteoarthritis of left knee (M17.12)  Note:Surgical Plans: Left Total Knee Replacement  Disposition: Home  PCP: Dr. Elizabeth Barnes  IV TXA  Anesthesia Issues: None  Signed electronically by Alezandrew L Perkins, III PA-C  

## 2015-12-26 NOTE — Anesthesia Procedure Notes (Addendum)
Spinal Patient location during procedure: OR End time: 12/26/2015 1:33 PM Staffing Resident/CRNA: Enrigue Catena E Performed by: anesthesiologist  Preanesthetic Checklist Completed: patient identified, site marked, surgical consent, pre-op evaluation, timeout performed, IV checked, risks and benefits discussed and monitors and equipment checked Spinal Block Patient position: sitting Prep: ChloraPrep Patient monitoring: heart rate, continuous pulse ox and blood pressure Approach: right paramedian Location: L3-4 Injection technique: single-shot Needle Needle type: Sprotte  Needle gauge: 24 G Needle length: 9 cm Assessment Sensory level: T6 Additional Notes Expiration date of kit checked and confirmed. Patient tolerated procedure well, without complications.

## 2015-12-26 NOTE — Op Note (Signed)
Pre-operative diagnosis- Osteoarthritis  Left knee(s)  Post-operative diagnosis- Osteoarthritis Left knee(s)  Procedure-  Left  Total Knee Arthroplasty ([employer] titanium fixed bearing system)  Surgeon- Gus Rankin. Aaban Griep, MD  Assistant- Twana First, PA-S   Anesthesia-  Spinal  EBL-* No blood loss amount entered *   Drains Hemovac  Tourniquet time- 46 minutes @ 300 mm Hg  Complications- None  Condition-PACU - hemodynamically stable.   Brief Clinical Note  Joy [name] is a 75 y.o. year old female with end stage OA of her left knee with progressively worsening pain and dysfunction. She has constant pain, with activity and at rest and significant functional deficits with difficulties even with ADLs. She has had extensive non-op management including analgesics, injections of cortisone and viscosupplements, and home exercise program, but remains in significant pain with significant dysfunction. Radiographs show bone on bone arthritis medial and patellofemoral. She presents now for left Total Knee Arthroplasty.    Procedure in detail---   The patient is brought into the operating room and positioned supine on the operating table. After successful administration of  Spinal,   a tourniquet is placed high on the  Left thigh(s) and the lower extremity is prepped and draped in the usual sterile fashion. Time out is performed by the operating team and then the  Left lower extremity is wrapped in Esmarch, knee flexed and the tourniquet inflated to 300 mmHg.       A midline incision is made with a ten blade through the subcutaneous tissue to the level of the extensor mechanism. A fresh blade is used to make a medial parapatellar arthrotomy. Soft tissue over the proximal medial tibia is subperiosteally elevated to the joint line with a knife and into the semimembranosus bursa with a Cobb elevator. Soft tissue over the proximal lateral tibia is elevated with attention being paid to avoiding the  patellar tendon on the tibial tubercle. The patella is everted, knee flexed 90 degrees and the ACL and PCL are removed. Findings are bone on bone all 3 compartments with large global osteophytes.        The drill is used to create a starting hole in the distal femur and the canal is thoroughly irrigated with sterile saline to remove the fatty contents. The 5 degree Left  valgus alignment guide is placed into the femoral canal and the distal femoral cutting block is pinned to remove 10 mm off the distal femur. Resection is made with an oscillating saw.      The tibia is subluxed forward and the menisci are removed. The extramedullary alignment guide is placed referencing proximally at the medial aspect of the tibial tubercle and distally along the second metatarsal axis and tibial crest. The block is pinned to remove 26mm off the more deficient lateral  side. Resection is made with an oscillating saw. Size 3is the most appropriate size for the tibia and the proximal tibia is prepared with the modular drill and keel punch for that size.      The femoral sizing guide is placed and size 4 is most appropriate. Rotation is marked off the epicondylar axis and confirmed by creating a rectangular flexion gap at 90 degrees. The size 4 cutting block is pinned in this rotation and the anterior, posterior and chamfer cuts are made with the oscillating saw. The intercondylar block is then placed and that cut is made.      Trial size 3 tibial component, trial size 4 posterior stabilized femur and a  12  mm posterior stabilized insert trial is placed. Full extension is achieved with excellent varus/valgus and anterior/posterior balance throughout full range of motion. The patella is everted and thickness measured to be 22  mm. Free hand resection is taken to 12 mm, a 4 template is placed, lug holes are drilled, trial patella is placed, and it tracks normally. Osteophytes are removed off the posterior femur with the trial in  place. All trials are removed and the cut bone surfaces prepared with pulsatile lavage. Cement is mixed and once ready for implantation, the size 3 tibial implant, size  4 posterior stabilized femoral component, and the size 4 patella are cemented in place and the patella is held with the clamp. The trial insert is placed and the knee held in full extension. The Exparel (20 ml mixed with 30 ml saline) and .25% Bupivicaine, are injected into the extensor mechanism, posterior capsule, medial and lateral gutters and subcutaneous tissues.  All extruded cement is removed and once the cement is hard the permanent 12 mm posterior stabilized  insert is placed into the tibial tray.      The wound is copiously irrigated with saline solution and the extensor mechanism closed over a hemovac drain with #1 V-loc suture. The tourniquet is released for a total tourniquet time of 46  minutes. Flexion against gravity is 140 degrees and the patella tracks normally. Subcutaneous tissue is closed with 2.0 vicryl and subcuticular with running 4.0 Monocryl. The incision is cleaned and dried and steri-strips and a bulky sterile dressing are applied. The limb is placed into a knee immobilizer and the patient is awakened and transported to recovery in stable condition.      Please note that a surgical assistant was a medical necessity for this procedure in order to perform it in a safe and expeditious manner. Surgical assistant was necessary to retract the ligaments and vital neurovascular structures to prevent injury to them and also necessary for proper positioning of the limb to allow for anatomic placement of the prosthesis.   Gus Rankin Rahm Minix, MD    12/26/2015, 2:46 PM

## 2015-12-26 NOTE — Progress Notes (Signed)
Utilization review completed.  

## 2015-12-26 NOTE — Transfer of Care (Signed)
Immediate Anesthesia Transfer of Care Note  Patient: Joy [name]  Procedure(s) Performed: Procedure(s): TOTAL KNEE ARTHROPLASTY (Left)  Patient Location: PACU  Anesthesia Type:Spinal  Level of Consciousness: sedated  Airway & Oxygen Therapy: Patient Spontanous Breathing and Patient connected to face mask oxygen  Post-op Assessment: Report given to RN and Post -op Vital signs reviewed and stable  Post vital signs: Reviewed and stable  Last Vitals:  Filed Vitals:   12/26/15 1103 12/26/15 1104  BP: 182/81 159/74  Pulse:    Temp:    Resp:      Last Pain:  Filed Vitals:   12/26/15 1243  PainSc: 1       Patients Stated Pain Goal: 4 (12/26/15 1243)  Complications: No apparent anesthesia complications

## 2015-12-27 ENCOUNTER — Encounter (HOSPITAL_COMMUNITY): Payer: Self-pay | Admitting: Orthopedic Surgery

## 2015-12-27 LAB — CBC
HEMATOCRIT: 32.6 % — AB (ref 36.0–46.0)
HEMOGLOBIN: 10.9 g/dL — AB (ref 12.0–15.0)
MCH: 31.7 pg (ref 26.0–34.0)
MCHC: 33.4 g/dL (ref 30.0–36.0)
MCV: 94.8 fL (ref 78.0–100.0)
Platelets: 251 10*3/uL (ref 150–400)
RBC: 3.44 MIL/uL — AB (ref 3.87–5.11)
RDW: 15.7 % — ABNORMAL HIGH (ref 11.5–15.5)
WBC: 7.7 10*3/uL (ref 4.0–10.5)

## 2015-12-27 LAB — BASIC METABOLIC PANEL
ANION GAP: 7 (ref 5–15)
BUN: 15 mg/dL (ref 6–20)
CHLORIDE: 110 mmol/L (ref 101–111)
CO2: 21 mmol/L — ABNORMAL LOW (ref 22–32)
Calcium: 9 mg/dL (ref 8.9–10.3)
Creatinine, Ser: 0.7 mg/dL (ref 0.44–1.00)
GFR calc Af Amer: >60 mL/min (ref >60)
Glucose, Bld: 153 mg/dL — ABNORMAL HIGH (ref 65–99)
POTASSIUM: 4.2 mmol/L (ref 3.5–5.1)
SODIUM: 138 mmol/L (ref 135–145)

## 2015-12-27 MED ORDER — HYDROMORPHONE HCL 2 MG PO TABS: 2.0000 mg | ORAL_TABLET | ORAL | Status: DC | PRN

## 2015-12-27 MED ORDER — METHOCARBAMOL 500 MG PO TABS: 500.0000 mg | ORAL_TABLET | Freq: Four times a day (QID) | ORAL | Status: DC | PRN

## 2015-12-27 MED ORDER — TRAMADOL HCL 50 MG PO TABS: 50.0000 mg | ORAL_TABLET | Freq: Four times a day (QID) | ORAL | Status: DC | PRN

## 2015-12-27 MED ORDER — RIVAROXABAN 10 MG PO TABS: 10.0000 mg | ORAL_TABLET | Freq: Every day | ORAL | Status: DC

## 2015-12-27 NOTE — Discharge Summary (Signed)
Physician Discharge Summary   Patient ID: Joy [name] MRN: [id number] DOB/AGE: 11/16/1940 75 y.o.  Admit date: 12/26/2015 Discharge date: 12-28-15  Primary Diagnosis:  Osteoarthritis Left knee(s)  Admission Diagnoses:  Past Medical History  Diagnosis Date  . CTS (carpal tunnel syndrome)   . Hypertension   . Hypercholesteremia   . Osteoporosis   . Bronchitis   . Arthritis     RHEUMATOID  . RA (rheumatoid arthritis) ([place])   . Osteoarthritis   . Essential hypertension 11/24/2015  . Hyperlipidemia 11/24/2015  . Rheumatoid arthritis ([place]) 11/24/2015  . Dysrhythmia     "irregularity" unknown at this time - being evaluated by cardiology  . Seasonal allergies   . GERD (gastroesophageal reflux disease)   . OAB (overactive bladder)   . IBS (irritable bowel syndrome)    Discharge Diagnoses:   Principal Problem:   OA (osteoarthritis) of knee  Estimated body mass index is 35.11 kg/(m^2) as calculated from the following:   Height as of this encounter: _0  (1.575 m).   Weight as of this encounter: 87.091 kg (192 lb).  Procedure:  Procedure(s) (LRB): TOTAL KNEE ARTHROPLASTY (Left)   Consults: None  HPI: Joy [name] is a 75 y.o. year old female with end stage OA of her left knee with progressively worsening pain and dysfunction. She has constant pain, with activity and at rest and significant functional deficits with difficulties even with ADLs. She has had extensive non-op management including analgesics, injections of cortisone and viscosupplements, and home exercise program, but remains in significant pain with significant dysfunction. Radiographs show bone on bone arthritis medial and patellofemoral. She presents now for left Total Knee Arthroplasty.   Laboratory Data: Admission on 12/26/2015  Component Date Value Ref Range Status  . ABO/RH(D) 12/26/2015 A POS   Final  . Antibody Screen 12/26/2015 NEG   Final  . Sample Expiration 12/26/2015 12/29/2015   Final  . Unit  Number 12/26/2015 [id number]   Final  . Blood Component Type 12/26/2015 RED CELLS,LR   Final  . Unit division 12/26/2015 00   Final  . Status of Unit 12/26/2015 ALLOCATED   Final  . Transfusion Status 12/26/2015 OK TO TRANSFUSE   Final  . Crossmatch Result 12/26/2015 COMPATIBLE   Final  . Donor AG Type 12/26/2015 NEGATIVE FOR DUFFY A ANTIGEN   Final  . WBC 12/27/2015 7.7  4.0 - 10.5 K/uL Final  . RBC 12/27/2015 3.44* 3.87 - 5.11 MIL/uL Final  . Hemoglobin 12/27/2015 10.9* 12.0 - 15.0 g/dL Final  . HCT 12/27/2015 32.6* 36.0 - 46.0 % Final  . MCV 12/27/2015 94.8  78.0 - 100.0 fL Final  . MCH 12/27/2015 31.7  26.0 - 34.0 pg Final  . MCHC 12/27/2015 33.4  30.0 - 36.0 g/dL Final  . RDW 12/27/2015 15.7* 11.5 - 15.5 % Final  . Platelets 12/27/2015 251  150 - 400 K/uL Final  . Sodium 12/27/2015 138  135 - 145 mmol/L Final  . Potassium 12/27/2015 4.2  3.5 - 5.1 mmol/L Final  . Chloride 12/27/2015 110  101 - 111 mmol/L Final  . CO2 12/27/2015 21* 22 - 32 mmol/L Final  . Glucose, Bld 12/27/2015 153* 65 - 99 mg/dL Final  . BUN 12/27/2015 15  6 - 20 mg/dL Final  . Creatinine, Ser 12/27/2015 0.70  0.44 - 1.00 mg/dL Final  . Calcium 12/27/2015 9.0  8.9 - 10.3 mg/dL Final  . GFR calc non Af Amer 12/27/2015 >60  >60 mL/min Final  .  GFR calc Af Amer 12/27/2015 >60  >60 mL/min Final   Comment: (NOTE) The eGFR has been calculated using the CKD EPI equation. This calculation has not been validated in all clinical situations. eGFR's persistently <60 mL/min signify possible Chronic Kidney Disease.   Georgiann Hahn gap 12/27/2015 7  5 - 15 Final  Hospital Outpatient Visit on 12/16/2015  Component Date Value Ref Range Status  . aPTT 12/16/2015 32  24 - 37 seconds Final  . WBC 12/16/2015 4.5  4.0 - 10.5 K/uL Final  . RBC 12/16/2015 3.96  3.87 - 5.11 MIL/uL Final  . Hemoglobin 12/16/2015 12.5  12.0 - 15.0 g/dL Final  . HCT 12/16/2015 38.1  36.0 - 46.0 % Final  . MCV 12/16/2015 96.2  78.0 - 100.0 fL Final    . MCH 12/16/2015 31.6  26.0 - 34.0 pg Final  . MCHC 12/16/2015 32.8  30.0 - 36.0 g/dL Final  . RDW 12/16/2015 15.4  11.5 - 15.5 % Final  . Platelets 12/16/2015 343  150 - 400 K/uL Final  . Sodium 12/16/2015 141  135 - 145 mmol/L Final  . Potassium 12/16/2015 4.9  3.5 - 5.1 mmol/L Final  . Chloride 12/16/2015 109  101 - 111 mmol/L Final  . CO2 12/16/2015 24  22 - 32 mmol/L Final  . Glucose, Bld 12/16/2015 104* 65 - 99 mg/dL Final  . BUN 12/16/2015 15  6 - 20 mg/dL Final  . Creatinine, Ser 12/16/2015 0.79  0.44 - 1.00 mg/dL Final  . Calcium 12/16/2015 10.0  8.9 - 10.3 mg/dL Final  . Total Protein 12/16/2015 6.4* 6.5 - 8.1 g/dL Final  . Albumin 12/16/2015 3.9  3.5 - 5.0 g/dL Final  . AST 12/16/2015 26  15 - 41 U/L Final  . ALT 12/16/2015 16  14 - 54 U/L Final  . Alkaline Phosphatase 12/16/2015 76  38 - 126 U/L Final  . Total Bilirubin 12/16/2015 0.5  0.3 - 1.2 mg/dL Final  . GFR calc non Af Amer 12/16/2015 >60  >60 mL/min Final  . GFR calc Af Amer 12/16/2015 >60  >60 mL/min Final   Comment: (NOTE) The eGFR has been calculated using the CKD EPI equation. This calculation has not been validated in all clinical situations. eGFR's persistently <60 mL/min signify possible Chronic Kidney Disease.   . Anion gap 12/16/2015 8  5 - 15 Final  . Prothrombin Time 12/16/2015 13.3  11.6 - 15.2 seconds Final  . INR 12/16/2015 0.99  0.00 - 1.49 Final  . ABO/RH(D) 12/16/2015 A POS   Final  . Antibody Screen 12/16/2015 NEG   Final  . Sample Expiration 12/16/2015 12/19/2015   Final  . Extend sample reason 12/16/2015 NO TRANSFUSIONS OR PREGNANCY IN THE PAST 3 MONTHS   Final  . Color, Urine 12/16/2015 YELLOW  YELLOW Final  . APPearance 12/16/2015 CLOUDY* CLEAR Final  . Specific Gravity, Urine 12/16/2015 1.017  1.005 - 1.030 Final  . pH 12/16/2015 6.0  5.0 - 8.0 Final  . Glucose, UA 12/16/2015 NEGATIVE  NEGATIVE mg/dL Final  . Hgb urine dipstick 12/16/2015 NEGATIVE  NEGATIVE Final  . Bilirubin Urine  12/16/2015 NEGATIVE  NEGATIVE Final  . Ketones, ur 12/16/2015 NEGATIVE  NEGATIVE mg/dL Final  . Protein, ur 12/16/2015 NEGATIVE  NEGATIVE mg/dL Final  . Nitrite 12/16/2015 NEGATIVE  NEGATIVE Final  . Leukocytes, UA 12/16/2015 TRACE* NEGATIVE Final  . MRSA, PCR 12/16/2015 NEGATIVE  NEGATIVE Final  . Staphylococcus aureus 12/16/2015 NEGATIVE  NEGATIVE Final   Comment:  The Xpert SA Assay (FDA approved for NASAL specimens in patients over 22 years of age), is one component of a comprehensive surveillance program.  Test performance has been validated by [facility] for patients greater than or equal to 86 year old. It is not intended to diagnose infection nor to guide or monitor treatment.   . Squamous Epithelial / LPF 12/16/2015 0-5* NONE SEEN Final  . WBC, UA 12/16/2015 0-5  0 - 5 WBC/hpf Final  . RBC / HPF 12/16/2015 0-5  0 - 5 RBC/hpf Final  . Bacteria, UA 12/16/2015 RARE* NONE SEEN Final  Hospital Outpatient Visit on 12/02/2015  Component Date Value Ref Range Status  . Rest HR 12/02/2015 58   Final  . Rest BP 12/02/2015 164/86   Final  . Peak HR 12/02/2015 85   Final  . Peak BP 12/02/2015 171/87   Final  . LV sys vol 12/02/2015 45   Final  . TID 12/02/2015 1.35   Final  . LV dias vol 12/02/2015 98  46 - 106 mL Final  . SSS 12/02/2015 0   Final  . SRS 12/02/2015 0   Final  . SDS 12/02/2015 0   Final     X-Rays:No results found.  EKG: Orders placed or performed in visit on 11/24/15  . EKG 12-Lead     Hospital Course: SIDDA HUMM is a 75 y.o. who was admitted to [facility]. They were brought to the operating room on 12/26/2015 and underwent Procedure(s): TOTAL KNEE ARTHROPLASTY.  Patient tolerated the procedure well and was later transferred to the recovery room and then to the orthopaedic floor for postoperative care.  They were given PO and IV analgesics for pain control following their surgery.  They were given 24 hours of postoperative antibiotics of   Anti-infectives    Start     Dose/Rate Route Frequency Ordered Stop   12/26/15 2000  ceFAZolin (ANCEF) IVPB 2g/100 mL premix     2 g 200 mL/hr over 30 Minutes Intravenous Every 6 hours 12/26/15 1658 12/27/15 0259   12/26/15 1046  ceFAZolin (ANCEF) IVPB 2g/100 mL premix     2 g 200 mL/hr over 30 Minutes Intravenous On call to O.R. 12/26/15 1046 12/26/15 1324     and started on DVT prophylaxis in the form of Xarelto.   PT and OT were ordered for total joint protocol.  Discharge planning consulted to help with postop disposition and equipment needs.  Patient had a decent night on the evening of surgery.  They started to get up OOB with therapy on day one. Hemovac drain was pulled without difficulty.  Continued to work with therapy into day two.  Dressing was changed on day two and the incision was healing well.  Patient was seen in rounds and was ready to go home on day two.  Discharge home with home health Diet - Cardiac diet Follow up - in 2 weeks Activity - WBAT Disposition - Home Condition Upon Discharge - Good D/C Meds - See DC Summary DVT Prophylaxis - Xarelto   Discharge Instructions    Call MD / Call 911    Complete by:  As directed   If you experience chest pain or shortness of breath, CALL 911 and be transported to the hospital emergency room.  If you develope a fever above 101 F, pus (white drainage) or increased drainage or redness at the wound, or calf pain, call your surgeon's office.     Change dressing  Complete by:  As directed   Change dressing daily with sterile 4 x 4 inch gauze dressing and apply TED hose. Do not submerge the incision under water.     Constipation Prevention    Complete by:  As directed   Drink plenty of fluids.  Prune juice may be helpful.  You may use a stool softener, such as Colace (over the counter) 100 mg twice a day.  Use MiraLax (over the counter) for constipation as needed.     Diet - low sodium heart healthy    Complete by:  As directed        Discharge instructions    Complete by:  As directed   Pick up stool softner and laxative for home use following surgery while on pain medications. Do not submerge incision under water. Please use good hand washing techniques while changing dressing each day. May shower starting three days after surgery. Please use a clean towel to pat the incision dry following showers. Continue to use ice for pain and swelling after surgery. Do not use any lotions or creams on the incision until instructed by your surgeon.  Take Xarelto for two and a half more weeks, then discontinue Xarelto. Once the patient has completed the blood thinner regimen, then take a Baby 81 mg Aspirin daily for three more weeks.  Postoperative Constipation Protocol  Constipation - defined medically as fewer than three stools per week and severe constipation as less than one stool per week.  One of the most common issues patients have following surgery is constipation.  Even if you have a regular bowel pattern at home, your normal regimen is likely to be disrupted due to multiple reasons following surgery.  Combination of anesthesia, postoperative narcotics, change in appetite and fluid intake all can affect your bowels.  In order to avoid complications following surgery, here are some recommendations in order to help you during your recovery period.  Colace (docusate) - Pick up an over-the-counter form of Colace or another stool softener and take twice a day as long as you are requiring postoperative pain medications.  Take with a full glass of water daily.  If you experience loose stools or diarrhea, hold the colace until you stool forms back up.  If your symptoms do not get better within 1 week or if they get worse, check with your doctor.  Dulcolax (bisacodyl) - Pick up over-the-counter and take as directed by the product packaging as needed to assist with the movement of your bowels.  Take with a full glass of water.  Use  this product as needed if not relieved by Colace only.   MiraLax (polyethylene glycol) - Pick up over-the-counter to have on hand.  MiraLax is a solution that will increase the amount of water in your bowels to assist with bowel movements.  Take as directed and can mix with a glass of water, juice, soda, coffee, or tea.  Take if you go more than two days without a movement. Do not use MiraLax more than once per day. Call your doctor if you are still constipated or irregular after using this medication for 7 days in a row.  If you continue to have problems with postoperative constipation, please contact the office for further assistance and recommendations.  If you experience "the worst abdominal pain ever" or develop nausea or vomiting, please contact the office immediatly for further recommendations for treatment.     Do not put a pillow under the knee. Place  it under the heel.    Complete by:  As directed      Do not sit on low chairs, stoools or toilet seats, as it may be difficult to get up from low surfaces    Complete by:  As directed      Driving restrictions    Complete by:  As directed   No driving until released by the physician.     Increase activity slowly as tolerated    Complete by:  As directed      Lifting restrictions    Complete by:  As directed   No lifting until released by the physician.     Patient may shower    Complete by:  As directed   You may shower without a dressing once there is no drainage.  Do not wash over the wound.  If drainage remains, do not shower until drainage stops.     TED hose    Complete by:  As directed   Use stockings (TED hose) for 3 weeks on both leg(s).  You may remove them at night for sleeping.     Weight bearing as tolerated    Complete by:  As directed   Laterality:  left  Extremity:  Lower            Medication List    STOP taking these medications        ALEVE 220 MG tablet  Generic drug:  naproxen sodium     Biotin 5000  MCG Tabs     diclofenac sodium 1 % Gel  Commonly known as:  VOLTAREN     Fish Oil 1000 MG Caps     methotrexate 50 MG/2ML injection     multivitamin with minerals Tabs tablet     REMICADE IV     research study medication     Vitamin D3 50000 units Caps      TAKE these medications        acetaminophen 325 MG tablet  Commonly known as:  TYLENOL  Take 650 mg by mouth every 6 (six) hours as needed (For pain.).     atorvastatin 20 MG tablet  Commonly known as:  LIPITOR  Take 20 mg by mouth daily.     cycloSPORINE 0.05 % ophthalmic emulsion  Commonly known as:  RESTASIS  Place 1 drop into both eyes 2 (two) times daily as needed (For dry eyes.).     folic acid 1 MG tablet  Commonly known as:  FOLVITE  Take 2-3 mg by mouth daily.     gabapentin 300 MG capsule  Commonly known as:  NEURONTIN  Take 300 mg by mouth 2 (two) times daily.     HYDROmorphone 2 MG tablet  Commonly known as:  DILAUDID  Take 1-2 tablets (2-4 mg total) by mouth every 4 (four) hours as needed for moderate pain or severe pain.     methocarbamol 500 MG tablet  Commonly known as:  ROBAXIN  Take 1 tablet (500 mg total) by mouth every 6 (six) hours as needed for muscle spasms.     montelukast 10 MG tablet  Commonly known as:  SINGULAIR  Take 10 mg by mouth at bedtime as needed (allergies).     pantoprazole 40 MG tablet  Commonly known as:  PROTONIX  Take 40 mg by mouth daily.     rivaroxaban 10 MG Tabs tablet  Commonly known as:  XARELTO  Take 1 tablet (10 mg total) by mouth daily  with breakfast. Take Xarelto for two and a half more weeks, then discontinue Xarelto. Once the patient has completed the blood thinner regimen, then take a Baby 81 mg Aspirin daily for three more weeks.     traMADol 50 MG tablet  Commonly known as:  ULTRAM  Take 1-2 tablets (50-100 mg total) by mouth every 6 (six) hours as needed (mild pain).     valsartan 320 MG tablet  Commonly known as:  DIOVAN  Take 320 mg by  mouth daily.           Follow-up Information    Follow up with Gearlean Alf, MD. Schedule an appointment as soon as possible for a visit on 01/10/2016.   Specialty:  Orthopedic Surgery   Why:  Call office at [phone] to setup appointment on Tuesday 01/10/2016 with Dr. Wynelle Link.   Contact information:   [address] [place] [address] [phone]       Signed: Arlee Muslim, PA-C Orthopaedic Surgery 12/27/2015, 9:31 AM

## 2015-12-27 NOTE — Discharge Instructions (Addendum)
° °Dr. Frank Aluisio °Total Joint Specialist °[facility] °[address] °[place], [place] [address] °[phone] ° °TOTAL KNEE REPLACEMENT POSTOPERATIVE DIRECTIONS ° °Knee Rehabilitation, Guidelines Following Surgery  °Results after knee surgery are often greatly improved when you follow the exercise, range of motion and muscle strengthening exercises prescribed by your doctor. Safety measures are also important to protect the knee from further injury. Any time any of these exercises cause you to have increased pain or swelling in your knee joint, decrease the amount until you are comfortable again and slowly increase them. If you have problems or questions, call your caregiver or physical therapist for advice.  ° °HOME CARE INSTRUCTIONS  °Remove items at home which could result in a fall. This includes throw rugs or furniture in walking pathways.  °· ICE to the affected knee every three hours for 30 minutes at a time and then as needed for pain and swelling.  Continue to use ice on the knee for pain and swelling from surgery. You may notice swelling that will progress down to the foot and ankle.  This is normal after surgery.  Elevate the leg when you are not up walking on it.   °· Continue to use the breathing machine which will help keep your temperature down.  It is common for your temperature to cycle up and down following surgery, especially at night when you are not up moving around and exerting yourself.  The breathing machine keeps your lungs expanded and your temperature down. °· Do not place pillow under knee, focus on keeping the knee straight while resting ° °DIET °You may resume your previous home diet once your are discharged from the hospital. ° °DRESSING / WOUND CARE / SHOWERING °You may shower 3 days after surgery, but keep the wounds dry during showering.  You may use an occlusive plastic wrap (Press'n Seal for example), NO SOAKING/SUBMERGING IN THE BATHTUB.  If the  bandage gets wet, change with a clean dry gauze.  If the incision gets wet, pat the wound dry with a clean towel. °You may start showering once you are discharged home but do not submerge the incision under water. Just pat the incision dry and apply a dry gauze dressing on daily. °Change the surgical dressing daily and reapply a dry dressing each time. ° °ACTIVITY °Walk with your walker as instructed. °Use walker as long as suggested by your caregivers. °Avoid periods of inactivity such as sitting longer than an hour when not asleep. This helps prevent blood clots.  °You may resume a sexual relationship in one month or when given the OK by your doctor.  °You may return to work once you are cleared by your doctor.  °Do not drive a car for 6 weeks or until released by you surgeon.  °Do not drive while taking narcotics. ° °WEIGHT BEARING °Weight bearing as tolerated with assist device (walker, cane, etc) as directed, use it as long as suggested by your surgeon or therapist, typically at least 4-6 weeks. ° °POSTOPERATIVE CONSTIPATION PROTOCOL °Constipation - defined medically as fewer than three stools per week and severe constipation as less than one stool per week. ° °One of the most common issues patients have following surgery is constipation.  Even if you have a regular bowel pattern at home, your normal regimen is likely to be disrupted due to multiple reasons following surgery.  Combination of anesthesia, postoperative narcotics, change in appetite and fluid intake all can affect your bowels.    In order to avoid complications following surgery, here are some recommendations in order to help you during your recovery period. ° °Colace (docusate) - Pick up an over-the-counter form of Colace or another stool softener and take twice a day as long as you are requiring postoperative pain medications.  Take with a full glass of water daily.  If you experience loose stools or diarrhea, hold the colace until you stool forms  back up.  If your symptoms do not get better within 1 week or if they get worse, check with your doctor. ° °Dulcolax (bisacodyl) - Pick up over-the-counter and take as directed by the product packaging as needed to assist with the movement of your bowels.  Take with a full glass of water.  Use this product as needed if not relieved by Colace only.  ° °MiraLax (polyethylene glycol) - Pick up over-the-counter to have on hand.  MiraLax is a solution that will increase the amount of water in your bowels to assist with bowel movements.  Take as directed and can mix with a glass of water, juice, soda, coffee, or tea.  Take if you go more than two days without a movement. °Do not use MiraLax more than once per day. Call your doctor if you are still constipated or irregular after using this medication for 7 days in a row. ° °If you continue to have problems with postoperative constipation, please contact the office for further assistance and recommendations.  If you experience "the worst abdominal pain ever" or develop nausea or vomiting, please contact the office immediatly for further recommendations for treatment. ° °ITCHING ° If you experience itching with your medications, try taking only a single pain pill, or even half a pain pill at a time.  You can also use Benadryl over the counter for itching or also to help with sleep.  ° °TED HOSE STOCKINGS °Wear the elastic stockings on both legs for three weeks following surgery during the day but you may remove then at night for sleeping. ° °MEDICATIONS °See your medication summary on the “After Visit Summary” that the nursing staff will review with you prior to discharge.  You may have some home medications which will be placed on hold until you complete the course of blood thinner medication.  It is important for you to complete the blood thinner medication as prescribed by your surgeon.  Continue your approved medications as instructed at time of  discharge. ° °PRECAUTIONS °If you experience chest pain or shortness of breath - call 911 immediately for transfer to the hospital emergency department.  °If you develop a fever greater that 101 F, purulent drainage from wound, increased redness or drainage from wound, foul odor from the wound/dressing, or calf pain - CONTACT YOUR SURGEON.   °                                                °FOLLOW-UP APPOINTMENTS °Make sure you keep all of your appointments after your operation with your surgeon and caregivers. You should call the office at the above phone number and make an appointment for approximately two weeks after the date of your surgery or on the date instructed by your surgeon outlined in the "After Visit Summary". ° ° °RANGE OF MOTION AND STRENGTHENING EXERCISES  °Rehabilitation of the knee is important following a knee injury or   an operation. After just a few days of immobilization, the muscles of the thigh which control the knee become weakened and shrink (atrophy). Knee exercises are designed to build up the tone and strength of the thigh muscles and to improve knee motion. Often times heat used for twenty to thirty minutes before working out will loosen up your tissues and help with improving the range of motion but do not use heat for the first two weeks following surgery. These exercises can be done on a training (exercise) mat, on the floor, on a table or on a bed. Use what ever works the best and is most comfortable for you Knee exercises include:  °Leg Lifts - While your knee is still immobilized in a splint or cast, you can do straight leg raises. Lift the leg to 60 degrees, hold for 3 sec, and slowly lower the leg. Repeat 10-20 times 2-3 times daily. Perform this exercise against resistance later as your knee gets better.  °Quad and Hamstring Sets - Tighten up the muscle on the front of the thigh (Quad) and hold for 5-10 sec. Repeat this 10-20 times hourly. Hamstring sets are done by pushing the  foot backward against an object and holding for 5-10 sec. Repeat as with quad sets.  °· Leg Slides: Lying on your back, slowly slide your foot toward your buttocks, bending your knee up off the floor (only go as far as is comfortable). Then slowly slide your foot back down until your leg is flat on the floor again. °· Angel Wings: Lying on your back spread your legs to the side as far apart as you can without causing discomfort.  °A rehabilitation program following serious knee injuries can speed recovery and prevent re-injury in the future due to weakened muscles. Contact your doctor or a physical therapist for more information on knee rehabilitation.  ° °IF YOU ARE TRANSFERRED TO A SKILLED REHAB FACILITY °If the patient is transferred to a skilled rehab facility following release from the hospital, a list of the current medications will be sent to the facility for the patient to continue.  When discharged from the skilled rehab facility, please have the facility set up the patient's Home Health Physical Therapy prior to being released. Also, the skilled facility will be responsible for providing the patient with their medications at time of release from the facility to include their pain medication, the muscle relaxants, and their blood thinner medication. If the patient is still at the rehab facility at time of the two week follow up appointment, the skilled rehab facility will also need to assist the patient in arranging follow up appointment in our office and any transportation needs. ° °MAKE SURE YOU:  °Understand these instructions.  °Get help right away if you are not doing well or get worse.  ° ° °Pick up stool softner and laxative for home use following surgery while on pain medications. °Do not submerge incision under water. °Please use good hand washing techniques while changing dressing each day. °May shower starting three days after surgery. °Please use a clean towel to pat the incision dry following  showers. °Continue to use ice for pain and swelling after surgery. °Do not use any lotions or creams on the incision until instructed by your surgeon. ° °Take Xarelto for two and a half more weeks, then discontinue Xarelto. °Once the patient has completed the blood thinner regimen, then take a Baby 81 mg Aspirin daily for three more weeks. ° °Information on   my medicine - XARELTO® (Rivaroxaban) ° °This medication education was reviewed with me or my healthcare representative as part of my discharge preparation.  The pharmacist that spoke with me during my hospital stay was:  Justin R Crowder, Stu-PharmD ° °Why was Xarelto® prescribed for you? °Xarelto® was prescribed for you to reduce the risk of blood clots forming after orthopedic surgery. The medical term for these abnormal blood clots is venous thromboembolism (VTE). ° °What do you need to know about xarelto® ? °Take your Xarelto® ONCE DAILY at the same time every day. °You may take it either with or without food. ° °If you have difficulty swallowing the tablet whole, you may crush it and mix in applesauce just prior to taking your dose. ° °Take Xarelto® exactly as prescribed by your doctor and DO NOT stop taking Xarelto® without talking to the doctor who prescribed the medication.  Stopping without other VTE prevention medication to take the place of Xarelto® may increase your risk of developing a clot. ° °After discharge, you should have regular check-up appointments with your healthcare provider that is prescribing your Xarelto®.   ° °What do you do if you miss a dose? °If you miss a dose, take it as soon as you remember on the same day then continue your regularly scheduled once daily regimen the next day. Do not take two doses of Xarelto® on the same day.  ° °Important Safety Information °A possible side effect of Xarelto® is bleeding. You should call your healthcare provider right away if you experience any of the following: °? Bleeding from an injury or  your nose that does not stop. °? Unusual colored urine (red or dark brown) or unusual colored stools (red or black). °? Unusual bruising for unknown reasons. °? A serious fall or if you hit your head (even if there is no bleeding). ° °Some medicines may interact with Xarelto® and might increase your risk of bleeding while on Xarelto®. To help avoid this, consult your healthcare provider or pharmacist prior to using any new prescription or non-prescription medications, including herbals, vitamins, non-steroidal anti-inflammatory drugs (NSAIDs) and supplements. ° °This website has more information on Xarelto®: www.xarelto.com. ° ° °

## 2015-12-27 NOTE — Evaluation (Signed)
Occupational Therapy Evaluation and Discharge Patient Details Name: Joy [name] MRN: [id number] DOB: [birth year] Today's Date: 12/27/2015    History of Present Illness 75 yo female s/p L TKA 12/26/15   Clinical Impression   Pt was independent with AD prior to admission. Pt currently requiring min to min guard assist for mobility and ADL. Educated in use of AE for LB bathing and dressing with pt choosing to rely on her family for assistance until she can perform on her own. Educated in safe footwear and fall prevention at home. No further OT needs.    Follow Up Recommendations  No OT follow up    Equipment Recommendations  None recommended by OT    Recommendations for Other Services       Precautions / Restrictions Precautions Precautions: Fall Required Braces or Orthoses: Knee Immobilizer - Left Knee Immobilizer - Left: Discontinue once straight leg raise with < 10 degree lag Restrictions Weight Bearing Restrictions: No LLE Weight Bearing: Weight bearing as tolerated      Mobility Bed Mobility Overal bed mobility: Needs Assistance Bed Mobility: Supine to Sit;Sit to Supine     Supine to sit: Supervision Sit to supine: Min assist   General bed mobility comments: small amount of assist for L LE back into bed, pt assisted L LE using her hands OOB  Transfers Overall transfer level: Needs assistance Equipment used: Rolling walker (2 wheeled) Transfers: Sit to/from Stand Sit to Stand: Min guard         General transfer comment: verbal cues for hand placement, min guard for safety    Balance                                            ADL Overall ADL's : Needs assistance/impaired Eating/Feeding: Independent;Sitting   Grooming: Min guard;Standing   Upper Body Bathing: Set up;Sitting   Lower Body Bathing: Minimal assistance;Sit to/from stand Lower Body Bathing Details (indicate cue type and reason): recommended long handled bath  sponge Upper Body Dressing : Set up;Sitting   Lower Body Dressing: Minimal assistance;Sit to/from stand Lower Body Dressing Details (indicate cue type and reason): educated in use of reacher, long shoe horn and sock aide Toilet Transfer: Min guard;Ambulation;RW;BSC (over toilet)           Functional mobility during ADLs: Min guard;Rolling walker       Vision     Perception     Praxis      Pertinent Vitals/Pain Pain Assessment: Faces Pain Score: 7  Faces Pain Scale: Hurts a little bit Pain Location: L Knee Pain Descriptors / Indicators: Sore Pain Intervention(s): Monitored during session;Premedicated before session;Repositioned;Ice applied     Hand Dominance Right   Extremity/Trunk Assessment Upper Extremity Assessment Upper Extremity Assessment: Overall WFL for tasks assessed (pt with RA)   Lower Extremity Assessment Lower Extremity Assessment: Defer to PT evaluation LLE Deficits / Details: hip flex 3-/5, moves ankle well   Cervical / Trunk Assessment Cervical / Trunk Assessment: Normal   Communication Communication Communication: No difficulties   Cognition Arousal/Alertness: Awake/alert Behavior During Therapy: WFL for tasks assessed/performed Overall Cognitive Status: Within Functional Limits for tasks assessed                     General Comments       Exercises       Shoulder Instructions  Home Living Family/patient expects to be discharged to:: Private residence Living Arrangements: Children Available Help at Discharge: Family Type of Home: House Home Access: Stairs to enter Entrance Stairs-Number of Steps: 1+1   Home Layout: Two level;1/2 bath on main level;Able to live on main level with bedroom/bathroom     Bathroom Shower/Tub: Chief Strategy Officer: Standard     Home Equipment: Environmental consultant - 2 wheels;Cane - single point;Crutches;Bedside commode;Tub bench          Prior Functioning/Environment Level of  Independence: Independent with assistive device(s)             OT Diagnosis:     OT Problem List:     OT Treatment/Interventions:      OT Goals(Current goals can be found in the care plan section) Acute Rehab OT Goals Patient Stated Goal: home tomorrow  OT Frequency:     Barriers to D/C:            Co-evaluation              End of Session Equipment Utilized During Treatment: Gait belt;Rolling walker;Left knee immobilizer  Activity Tolerance: Patient tolerated treatment well Patient left: in bed;with call bell/phone within reach;with bed alarm set   Time: 6808-8110 OT Time Calculation (min): 29 min Charges:  OT General Charges $OT Visit: 1 Procedure OT Evaluation $OT Eval Low Complexity: 1 Procedure G-Codes:    Evern Bio 12/27/2015, 3:12 PM  [phone]

## 2015-12-27 NOTE — Progress Notes (Signed)
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   Follow Up Recommendations  Home health PT;Supervision/Assistance - 24 hour     Equipment Recommendations  None recommended by PT    Recommendations for Other Services       Precautions / Restrictions Precautions Precautions: Fall Required Braces or Orthoses: Knee Immobilizer - Left Knee Immobilizer - Left: Discontinue once straight leg raise with < 10 degree lag Restrictions Weight Bearing Restrictions: No LLE Weight Bearing: Weight bearing as tolerated    Mobility  Bed Mobility Overal bed mobility: Needs Assistance Bed Mobility: Supine to Sit;Sit to Supine     Supine to sit: Min guard Sit to supine: Min guard   General bed mobility comments: close guard for safety.   Transfers Overall transfer level: Needs assistance Equipment used: Rolling walker (2 wheeled) Transfers: Sit to/from Stand Sit to Stand: Min guard         General transfer comment: verbal cues for hand placement, min guard for safety  Ambulation/Gait Ambulation/Gait assistance: Min guard Ambulation Distance (Feet): 40 Feet Assistive device: Rolling walker (2 wheeled) Gait Pattern/deviations: Step-to pattern;Antalgic     General Gait Details: slow gait speed. VCs safety, technique, sequence.    Stairs            Wheelchair Mobility    Modified Rankin (Stroke Patients Only)       Balance                                    Cognition Arousal/Alertness: Awake/alert Behavior During Therapy: WFL for tasks assessed/performed Overall Cognitive Status: Within Functional Limits for tasks assessed                      Exercises Total Joint Exercises Ankle Circles/Pumps: AROM;Both;10 reps;Supine Quad Sets: AROM;Both;10  reps;Supine Heel Slides: AAROM;Left;10 reps;Supine Hip ABduction/ADduction: AAROM;Left;10 reps;Supine Goniometric ROM: ~10-50 degrees    General Comments        Pertinent Vitals/Pain Pain Assessment: 0-10 Pain Score: 7  Faces Pain Scale: Hurts a little bit Pain Location: L knee Pain Descriptors / Indicators: Sore Pain Intervention(s): Monitored during session;Repositioned;Ice applied    Home Living Family/patient expects to be discharged to:: Private residence Living Arrangements: Children Available Help at Discharge: Family Type of Home: House Home Access: Stairs to enter   Home Layout: Two level;1/2 bath on main level;Able to live on main level with bedroom/bathroom Home Equipment: Dan Humphreys - 2 wheels;Cane - single point;Crutches;Bedside commode;Tub bench      Prior Function Level of Independence: Independent with assistive device(s)          PT Goals (current goals can now be found in the care plan section) Acute Rehab PT Goals Patient Stated Goal: home tomorrow PT Goal Formulation: With patient Time For Goal Achievement: 01/03/16 Potential to Achieve Goals: Good Progress towards PT goals: Progressing toward goals    Frequency  7X/week    PT Plan Current plan remains appropriate    Co-evaluation             End of Session Equipment Utilized During Treatment: Gait belt;Left knee immobilizer Activity Tolerance: Patient tolerated treatment well Patient left: with call bell/phone within reach;with bed alarm set  Time: 1441-1450 PT Time Calculation (min) (ACUTE ONLY): 9 min  Charges:  $Gait Training: 8-22 mins                    G Codes:      Rebeca Alert, MPT Pager: [phone]

## 2015-12-27 NOTE — Care Management Note (Signed)
Case Management Note  Patient Details  Name: Joy [name] MRN: [id number] Date of Birth: May 05, 1941  Subjective/Objective:      S/p Left Total Knee Arthroplasty               Action/Plan: Discharge planning, spoke with patient and spouse at beside. Chose [facility] for [facility] services, contacted [facility] for referral. Has all needed DME.   Expected Discharge Date:  12/28/15               Expected Discharge Plan:  Home w Home Health Services  In-House Referral:  NA  Discharge planning Services  CM Consult  Post Acute Care Choice:  Home Health Choice offered to:  Patient  DME Arranged:  N/A DME Agency:  NA  HH Arranged:  PT HH Agency:  [facility]  Status of Service:  Completed, signed off  Medicare Important Message Given:    Date Medicare IM Given:    Medicare IM give by:    Date Additional Medicare IM Given:    Additional Medicare Important Message give by:     If discussed at Long Length of Stay Meetings, dates discussed:    Additional Comments:  Alexis Goodell, RN 12/27/2015, 11:54 AM [phone]

## 2015-12-27 NOTE — Progress Notes (Signed)
   Subjective: 1 Day Post-Op Procedure(s) (LRB): TOTAL KNEE ARTHROPLASTY (Left) Patient reports pain as mild.   Patient seen in rounds with Dr. Lequita Halt. Patient is well, but has had some minor complaints of pain in the knee, requiring pain medications We will start therapy today.  Plan is to go Home after hospital stay.  Objective: Vital signs in last 24 hours: Temp:  [97.4 F (36.3 C)-98.1 F (36.7 C)] 97.6 F (36.4 C) (05/09 0603) Pulse Rate:  [52-66] 65 (05/09 0603) Resp:  [10-20] 16 (05/09 0603) BP: (95-186)/(50-113) 158/59 mmHg (05/09 0603) SpO2:  [97 %-100 %] 97 % (05/09 0603) Weight:  [87.091 kg (192 lb)] 87.091 kg (192 lb) (05/08 1049)  Intake/Output from previous day:  Intake/Output Summary (Last 24 hours) at 12/27/15 0835 Last data filed at 12/27/15 0617  Gross per 24 hour  Intake 3658.75 ml  Output   2480 ml  Net 1178.75 ml    Intake/Output this shift: UOP 500  Labs:  Recent Labs  12/27/15 0419  HGB 10.9*    Recent Labs  12/27/15 0419  WBC 7.7  RBC 3.44*  HCT 32.6*  PLT 251    Recent Labs  12/27/15 0419  NA 138  K 4.2  CL 110  CO2 21*  BUN 15  CREATININE 0.70  GLUCOSE 153*  CALCIUM 9.0   No results for input(s): LABPT, INR in the last 72 hours.  EXAM General - Patient is Alert, Appropriate and Oriented Extremity - Neurovascular intact Sensation intact distally Dorsiflexion/Plantar flexion intact Dressing - dressing C/D/I Motor Function - intact, moving foot and toes well on exam.  Hemovac pulled without difficulty.  Past Medical History  Diagnosis Date  . CTS (carpal tunnel syndrome)   . Hypertension   . Hypercholesteremia   . Osteoporosis   . Bronchitis   . Arthritis     RHEUMATOID  . RA (rheumatoid arthritis) (HCC)   . Osteoarthritis   . Essential hypertension 11/24/2015  . Hyperlipidemia 11/24/2015  . Rheumatoid arthritis (HCC) 11/24/2015  . Dysrhythmia     "irregularity" unknown at this time - being evaluated by  cardiology  . Seasonal allergies   . GERD (gastroesophageal reflux disease)   . OAB (overactive bladder)   . IBS (irritable bowel syndrome)     Assessment/Plan: 1 Day Post-Op Procedure(s) (LRB): TOTAL KNEE ARTHROPLASTY (Left) Principal Problem:   OA (osteoarthritis) of knee  Estimated body mass index is 35.11 kg/(m^2) as calculated from the following:   Height as of this encounter: 5\' 2"  (1.575 m).   Weight as of this encounter: 87.091 kg (192 lb). Advance diet Up with therapy Plan for discharge tomorrow Discharge home with home health  DVT Prophylaxis - Xarelto Weight-Bearing as tolerated to left leg D/C O2 and Pulse OX and try on Room Air  , PA-C Orthopaedic Surgery 12/27/2015, 8:35 AM

## 2015-12-27 NOTE — Evaluation (Signed)
Physical Therapy Evaluation Patient Details Name: Joy [name] MRN: [id number] DOB: 1941-04-29 Today's Date: 12/27/2015   History of Present Illness  75 yo female s/p L TKA 12/26/15  Clinical Impression  On eval, pt required Min assist for mobility-walked ~40 feet with RW. Pain rated 7/10. Will follow and progress activity as able.     Follow Up Recommendations Home health PT;Supervision/Assistance - 24 hour    Equipment Recommendations  None recommended by PT    Recommendations for Other Services       Precautions / Restrictions Precautions Precautions: Fall Required Braces or Orthoses: Knee Immobilizer - Left Knee Immobilizer - Left: Discontinue once straight leg raise with < 10 degree lag Restrictions Weight Bearing Restrictions: No LLE Weight Bearing: Weight bearing as tolerated      Mobility  Bed Mobility Overal bed mobility: Needs Assistance Bed Mobility: Supine to Sit     Supine to sit: Min assist     General bed mobility comments: small amount of assist for L LE  Transfers Overall transfer level: Needs assistance Equipment used: Rolling walker (2 wheeled) Transfers: Sit to/from Stand Sit to Stand: Min guard         General transfer comment: VCs safety, technique, hand/LE placement. Assist to rise, stabilize, control descent.   Ambulation/Gait Ambulation/Gait assistance: Min guard Ambulation Distance (Feet): 40 Feet Assistive device: Rolling walker (2 wheeled) Gait Pattern/deviations: Step-to pattern;Antalgic     General Gait Details: slow gait speed. VCs safety, technique, sequence.   Stairs            Wheelchair Mobility    Modified Rankin (Stroke Patients Only)       Balance                                             Pertinent Vitals/Pain Pain Assessment: 0-10 Pain Score: 7  Pain Location: L knee Pain Descriptors / Indicators: Aching;Sore Pain Intervention(s): Monitored during session;Ice  applied;Repositioned    Home Living Family/patient expects to be discharged to:: Private residence Living Arrangements: Children Available Help at Discharge: Family Type of Home: House Home Access: Stairs to enter   Secretary/administrator of Steps: 1+1 Home Layout: Able to live on main level with bedroom/bathroom Home Equipment: Walker - 2 wheels;Cane - single point;Crutches;Bedside commode      Prior Function Level of Independence: Independent               Hand Dominance        Extremity/Trunk Assessment   Upper Extremity Assessment: Defer to OT evaluation           Lower Extremity Assessment: LLE deficits/detail   LLE Deficits / Details: hip flex 3-/5, moves ankle well  Cervical / Trunk Assessment: Normal  Communication   Communication: No difficulties  Cognition Arousal/Alertness: Awake/alert Behavior During Therapy: WFL for tasks assessed/performed Overall Cognitive Status: Within Functional Limits for tasks assessed                      General Comments      Exercises Total Joint Exercises Ankle Circles/Pumps: AROM;Both;10 reps;Supine Quad Sets: AROM;Both;10 reps;Supine Heel Slides: AAROM;Left;10 reps;Supine Hip ABduction/ADduction: AAROM;Left;10 reps;Supine Goniometric ROM: ~10-50 degrees      Assessment/Plan    PT Assessment Patient needs continued PT services  PT Diagnosis Difficulty walking;Acute pain   PT Problem List Decreased strength;Decreased range of  motion;Decreased activity tolerance;Decreased balance;Decreased mobility;Pain;Decreased cognition;Decreased knowledge of use of DME  PT Treatment Interventions DME instruction;Gait training;Stair training;Functional mobility training;Therapeutic activities;Patient/family education;Balance training;Therapeutic exercise   PT Goals (Current goals can be found in the Care Plan section) Acute Rehab PT Goals Patient Stated Goal: home tomorrow PT Goal Formulation: With patient Time  For Goal Achievement: 01/03/16 Potential to Achieve Goals: Good    Frequency 7X/week   Barriers to discharge        Co-evaluation               End of Session Equipment Utilized During Treatment: Gait belt;Left knee immobilizer Activity Tolerance: Patient tolerated treatment well Patient left: in chair;with call bell/phone within reach;with chair alarm set           Time: 7035-0093 PT Time Calculation (min) (ACUTE ONLY): 29 min   Charges:   PT Evaluation $PT Eval Low Complexity: 1 Procedure PT Treatments $Gait Training: 8-22 mins   PT G Codes:        Rebeca Alert, MPT Pager: [phone]

## 2015-12-28 LAB — BASIC METABOLIC PANEL
Anion gap: 6 (ref 5–15)
BUN: 19 mg/dL (ref 6–20)
CALCIUM: 9.2 mg/dL (ref 8.9–10.3)
CO2: 24 mmol/L (ref 22–32)
CREATININE: 0.72 mg/dL (ref 0.44–1.00)
Chloride: 109 mmol/L (ref 101–111)
GFR calc non Af Amer: >60 mL/min (ref >60)
Glucose, Bld: 135 mg/dL — ABNORMAL HIGH (ref 65–99)
Potassium: 3.9 mmol/L (ref 3.5–5.1)
SODIUM: 139 mmol/L (ref 135–145)

## 2015-12-28 LAB — CBC
HEMATOCRIT: 31 % — AB (ref 36.0–46.0)
HEMOGLOBIN: 10.4 g/dL — AB (ref 12.0–15.0)
MCH: 31.7 pg (ref 26.0–34.0)
MCHC: 33.5 g/dL (ref 30.0–36.0)
MCV: 94.5 fL (ref 78.0–100.0)
Platelets: 248 10*3/uL (ref 150–400)
RBC: 3.28 MIL/uL — ABNORMAL LOW (ref 3.87–5.11)
RDW: 15.6 % — ABNORMAL HIGH (ref 11.5–15.5)
WBC: 11.1 10*3/uL — AB (ref 4.0–10.5)

## 2015-12-28 NOTE — Progress Notes (Signed)
Physical Therapy Treatment Patient Details Name: Joy [name] MRN: [id number] DOB: October 11, 1940 Today's Date: 12/28/2015    History of Present Illness 75 yo female s/p L TKA 12/26/15    PT Comments    Progressing slowly with mobility. Limited by pain, fatigue on today. Instructed pt/daughter to use wheelchair to get pt to home entry steps. All education completed. No further questions/concerns from pt/daughter.   Follow Up Recommendations  Home health PT;Supervision/Assistance - 24 hour     Equipment Recommendations  None recommended by PT    Recommendations for Other Services       Precautions / Restrictions Precautions Precautions: Fall Required Braces or Orthoses: Knee Immobilizer - Left Knee Immobilizer - Left: Discontinue once straight leg raise with < 10 degree lag Restrictions Weight Bearing Restrictions: No LLE Weight Bearing: Weight bearing as tolerated    Mobility  Bed Mobility               General bed mobility comments: oob in recliner  Transfers Overall transfer level: Needs assistance Equipment used: Rolling walker (2 wheeled) Transfers: Sit to/from Stand Sit to Stand: Min guard         General transfer comment: close guard for safety. VCs safety, hand placement  Ambulation/Gait Ambulation/Gait assistance: Min guard Ambulation Distance (Feet): 50 Feet Assistive device: Rolling walker (2 wheeled) Gait Pattern/deviations: Step-to pattern;Antalgic     General Gait Details: slow gait speed. close guard for safety. 3 brief standing rest breaks taken due to fatigue, pain. Pt denied lightheadedness/dizziness.    Stairs            Wheelchair Mobility    Modified Rankin (Stroke Patients Only)       Balance                                    Cognition Arousal/Alertness: Awake/alert Behavior During Therapy: WFL for tasks assessed/performed Overall Cognitive Status: Within Functional Limits for tasks assessed                       Exercises      General Comments        Pertinent Vitals/Pain Pain Assessment: 0-10 Pain Score: 8  Pain Location: L knee Pain Descriptors / Indicators: Sore Pain Intervention(s): Monitored during session;Repositioned    Home Living                      Prior Function            PT Goals (current goals can now be found in the care plan section) Progress towards PT goals: Progressing toward goals    Frequency  7X/week    PT Plan Current plan remains appropriate    Co-evaluation             End of Session Equipment Utilized During Treatment: Gait belt;Left knee immobilizer Activity Tolerance: Patient tolerated treatment well Patient left: in bed;with call bell/phone within reach;with family/visitor present     Time: 1400-1410 PT Time Calculation (min) (ACUTE ONLY): 10 min  Charges:  $Gait Training: 8-22 mins                    G Codes:      Rebeca Alert, MPT Pager: [phone]

## 2015-12-28 NOTE — Progress Notes (Signed)
Pt to d/c home with [facility]. No DME needs. AVS reviewed and "My Chart" discussed with pt. Pt capable of verbalizing medications, dressing changes, signs and symptoms of infection, and follow-up appointments. Remains hemodynamically stable. No signs and symptoms of distress. Educated pt to return to ER in the case of SOB, dizziness, or chest pain.

## 2015-12-28 NOTE — Progress Notes (Signed)
Physical Therapy Treatment Patient Details Name: Joy [name] MRN: [id number] DOB: [birth year] Today's Date: 12/28/2015    History of Present Illness 75 yo female s/p L TKA 12/26/15    PT Comments    Pt reports increased pain and fatigue on today. Practiced/reviewed ambulation, exercises, and stair training. Will plan to have a 2nd session prior to d/c later today.   Follow Up Recommendations  Home health PT;Supervision/Assistance - 24 hour     Equipment Recommendations  None recommended by PT    Recommendations for Other Services       Precautions / Restrictions Precautions Precautions: Fall Required Braces or Orthoses: Knee Immobilizer - Left Knee Immobilizer - Left: Discontinue once straight leg raise with < 10 degree lag Restrictions Weight Bearing Restrictions: No LLE Weight Bearing: Weight bearing as tolerated    Mobility  Bed Mobility Overal bed mobility: Needs Assistance Bed Mobility: Supine to Sit     Supine to sit: Min assist     General bed mobility comments: assist for L LE. Increased time.   Transfers Overall transfer level: Needs assistance Equipment used: Rolling walker (2 wheeled) Transfers: Sit to/from Stand Sit to Stand: Min guard         General transfer comment: close guard for safety. VCs safety, hand placement  Ambulation/Gait Ambulation/Gait assistance: Min guard Ambulation Distance (Feet): 45 Feet Assistive device: Rolling walker (2 wheeled) Gait Pattern/deviations: Step-to pattern     General Gait Details: slow gait speed. close guard for safety. 3-4 brief standing rest breaks taken due to fatigue, pain.    Stairs Stairs: Yes Stairs assistance: Min assist Stair Management: Step to pattern;Forwards;One rail Right Number of Stairs: 2 General stair comments: VCs safety, technique, sequence. small amount of assist to stabilize pt. Daughter present during session.   Wheelchair Mobility    Modified Rankin (Stroke Patients  Only)       Balance                                    Cognition Arousal/Alertness: Awake/alert Behavior During Therapy: WFL for tasks assessed/performed Overall Cognitive Status: Within Functional Limits for tasks assessed                      Exercises Total Joint Exercises Ankle Circles/Pumps: AROM;Both;10 reps;Supine Quad Sets: AROM;Both;10 reps;Supine Heel Slides: AAROM;Left;10 reps;Supine Hip ABduction/ADduction: AAROM;Left;10 reps;Supine Goniometric ROM: ~10-50 degrees    General Comments        Pertinent Vitals/Pain Pain Assessment: 0-10 Pain Score: 8  Pain Location:  knee Pain Descriptors / Indicators: Sore Pain Intervention(s): Monitored during session;Repositioned;Ice applied    Home Living                      Prior Function            PT Goals (current goals can now be found in the care plan section) Progress towards PT goals: Progressing toward goals    Frequency  7X/week    PT Plan Current plan remains appropriate    Co-evaluation             End of Session Equipment Utilized During Treatment: Gait belt;Left knee immobilizer Activity Tolerance: Patient limited by fatigue;Patient limited by pain Patient left: in chair;with call bell/phone within reach;with chair alarm set;with family/visitor present     Time: 7564-3329 PT Time Calculation (min) (ACUTE ONLY): 25 min  Charges:  $  Gait Training: 8-22 mins $Therapeutic Exercise: 8-22 mins                    G Codes:      Weston Anna, MPT Pager: [phone]

## 2015-12-28 NOTE — Progress Notes (Signed)
   Subjective: 2 Days Post-Op Procedure(s) (LRB): TOTAL KNEE ARTHROPLASTY (Left) Patient reports pain as mild.   Patient seen in rounds with Dr. Lequita Halt. Patient is well, and has had no acute complaints or problems Patient is ready to go home  Objective: Vital signs in last 24 hours: Temp:  [97.9 F (36.6 C)-98.5 F (36.9 C)] 98.5 F (36.9 C) (05/10 0726) Pulse Rate:  [64-102] 64 (05/10 0726) Resp:  [14-18] 14 (05/10 0726) BP: (118-152)/(46-112) 150/60 mmHg (05/10 0726) SpO2:  [98 %-99 %] 99 % (05/10 0726)  Intake/Output from previous day:  Intake/Output Summary (Last 24 hours) at 12/28/15 0737 Last data filed at 12/28/15 0447  Gross per 24 hour  Intake 1602.5 ml  Output   1500 ml  Net  102.5 ml    Labs:  Recent Labs  12/27/15 0419 12/28/15 0402  HGB 10.9* 10.4*    Recent Labs  12/27/15 0419 12/28/15 0402  WBC 7.7 11.1*  RBC 3.44* 3.28*  HCT 32.6* 31.0*  PLT 251 248    Recent Labs  12/27/15 0419 12/28/15 0402  NA 138 139  K 4.2 3.9  CL 110 109  CO2 21* 24  BUN 15 19  CREATININE 0.70 0.72  GLUCOSE 153* 135*  CALCIUM 9.0 9.2   No results for input(s): LABPT, INR in the last 72 hours.  EXAM: General - Patient is Alert and Appropriate Extremity - Neurovascular intact Sensation intact distally Incision - clean, no drainage Motor Function - intact, moving foot and toes well on exam.   Assessment/Plan: 2 Days Post-Op Procedure(s) (LRB): TOTAL KNEE ARTHROPLASTY (Left) Procedure(s) (LRB): TOTAL KNEE ARTHROPLASTY (Left) Past Medical History  Diagnosis Date  . CTS (carpal tunnel syndrome)   . Hypertension   . Hypercholesteremia   . Osteoporosis   . Bronchitis   . Arthritis     RHEUMATOID  . RA (rheumatoid arthritis) (HCC)   . Osteoarthritis   . Essential hypertension 11/24/2015  . Hyperlipidemia 11/24/2015  . Rheumatoid arthritis (HCC) 11/24/2015  . Dysrhythmia     "irregularity" unknown at this time - being evaluated by cardiology  . Seasonal  allergies   . GERD (gastroesophageal reflux disease)   . OAB (overactive bladder)   . IBS (irritable bowel syndrome)    Principal Problem:   OA (osteoarthritis) of knee  Estimated body mass index is 35.11 kg/(m^2) as calculated from the following:   Height as of this encounter: 5\' 2"  (1.575 m).   Weight as of this encounter: 87.091 kg (192 lb). Up with therapy Discharge home with home health Diet - Cardiac diet Follow up - in 2 weeks Activity - WBAT Disposition - Home Condition Upon Discharge - Good D/C Meds - See DC Summary DVT Prophylaxis - Xarelto  , PA-C Orthopaedic Surgery 12/28/2015, 7:37 AM

## 2015-12-28 NOTE — Progress Notes (Signed)
Update per patient and bundle, [facility] will provide HHPT instead of [facility].

## 2015-12-29 DIAGNOSIS — Z471 Aftercare following joint replacement surgery: Secondary | ICD-10-CM | POA: Diagnosis not present

## 2015-12-29 DIAGNOSIS — G629 Polyneuropathy, unspecified: Secondary | ICD-10-CM | POA: Diagnosis not present

## 2015-12-29 DIAGNOSIS — M1990 Unspecified osteoarthritis, unspecified site: Secondary | ICD-10-CM | POA: Diagnosis not present

## 2015-12-29 DIAGNOSIS — M069 Rheumatoid arthritis, unspecified: Secondary | ICD-10-CM | POA: Diagnosis not present

## 2015-12-29 DIAGNOSIS — M47816 Spondylosis without myelopathy or radiculopathy, lumbar region: Secondary | ICD-10-CM | POA: Diagnosis not present

## 2015-12-29 DIAGNOSIS — M519 Unspecified thoracic, thoracolumbar and lumbosacral intervertebral disc disorder: Secondary | ICD-10-CM | POA: Diagnosis not present

## 2015-12-29 LAB — TYPE AND SCREEN
ABO/RH(D): A POS
ANTIBODY SCREEN: NEGATIVE
DONOR AG TYPE: NEGATIVE
UNIT DIVISION: 0

## 2015-12-30 DIAGNOSIS — G629 Polyneuropathy, unspecified: Secondary | ICD-10-CM | POA: Diagnosis not present

## 2015-12-30 DIAGNOSIS — M1990 Unspecified osteoarthritis, unspecified site: Secondary | ICD-10-CM | POA: Diagnosis not present

## 2015-12-30 DIAGNOSIS — M069 Rheumatoid arthritis, unspecified: Secondary | ICD-10-CM | POA: Diagnosis not present

## 2015-12-30 DIAGNOSIS — Z471 Aftercare following joint replacement surgery: Secondary | ICD-10-CM | POA: Diagnosis not present

## 2015-12-30 DIAGNOSIS — M519 Unspecified thoracic, thoracolumbar and lumbosacral intervertebral disc disorder: Secondary | ICD-10-CM | POA: Diagnosis not present

## 2015-12-30 DIAGNOSIS — M47816 Spondylosis without myelopathy or radiculopathy, lumbar region: Secondary | ICD-10-CM | POA: Diagnosis not present

## 2016-01-02 DIAGNOSIS — M47816 Spondylosis without myelopathy or radiculopathy, lumbar region: Secondary | ICD-10-CM | POA: Diagnosis not present

## 2016-01-02 DIAGNOSIS — G629 Polyneuropathy, unspecified: Secondary | ICD-10-CM | POA: Diagnosis not present

## 2016-01-02 DIAGNOSIS — Z471 Aftercare following joint replacement surgery: Secondary | ICD-10-CM | POA: Diagnosis not present

## 2016-01-02 DIAGNOSIS — M1990 Unspecified osteoarthritis, unspecified site: Secondary | ICD-10-CM | POA: Diagnosis not present

## 2016-01-02 DIAGNOSIS — M069 Rheumatoid arthritis, unspecified: Secondary | ICD-10-CM | POA: Diagnosis not present

## 2016-01-02 DIAGNOSIS — M519 Unspecified thoracic, thoracolumbar and lumbosacral intervertebral disc disorder: Secondary | ICD-10-CM | POA: Diagnosis not present

## 2016-01-04 DIAGNOSIS — M069 Rheumatoid arthritis, unspecified: Secondary | ICD-10-CM | POA: Diagnosis not present

## 2016-01-04 DIAGNOSIS — M519 Unspecified thoracic, thoracolumbar and lumbosacral intervertebral disc disorder: Secondary | ICD-10-CM | POA: Diagnosis not present

## 2016-01-04 DIAGNOSIS — M47816 Spondylosis without myelopathy or radiculopathy, lumbar region: Secondary | ICD-10-CM | POA: Diagnosis not present

## 2016-01-04 DIAGNOSIS — M1990 Unspecified osteoarthritis, unspecified site: Secondary | ICD-10-CM | POA: Diagnosis not present

## 2016-01-04 DIAGNOSIS — Z471 Aftercare following joint replacement surgery: Secondary | ICD-10-CM | POA: Diagnosis not present

## 2016-01-04 DIAGNOSIS — G629 Polyneuropathy, unspecified: Secondary | ICD-10-CM | POA: Diagnosis not present

## 2016-01-05 DIAGNOSIS — M519 Unspecified thoracic, thoracolumbar and lumbosacral intervertebral disc disorder: Secondary | ICD-10-CM | POA: Diagnosis not present

## 2016-01-05 DIAGNOSIS — M069 Rheumatoid arthritis, unspecified: Secondary | ICD-10-CM | POA: Diagnosis not present

## 2016-01-05 DIAGNOSIS — Z471 Aftercare following joint replacement surgery: Secondary | ICD-10-CM | POA: Diagnosis not present

## 2016-01-05 DIAGNOSIS — M1990 Unspecified osteoarthritis, unspecified site: Secondary | ICD-10-CM | POA: Diagnosis not present

## 2016-01-05 DIAGNOSIS — M47816 Spondylosis without myelopathy or radiculopathy, lumbar region: Secondary | ICD-10-CM | POA: Diagnosis not present

## 2016-01-05 DIAGNOSIS — G629 Polyneuropathy, unspecified: Secondary | ICD-10-CM | POA: Diagnosis not present

## 2016-01-06 ENCOUNTER — Ambulatory Visit: Payer: Medicare Other | Attending: Orthopedic Surgery | Admitting: Physical Therapy

## 2016-01-06 DIAGNOSIS — M25562 Pain in left knee: Secondary | ICD-10-CM | POA: Diagnosis not present

## 2016-01-06 DIAGNOSIS — M25662 Stiffness of left knee, not elsewhere classified: Secondary | ICD-10-CM | POA: Diagnosis not present

## 2016-01-06 DIAGNOSIS — R2689 Other abnormalities of gait and mobility: Secondary | ICD-10-CM

## 2016-01-06 DIAGNOSIS — R262 Difficulty in walking, not elsewhere classified: Secondary | ICD-10-CM

## 2016-01-06 NOTE — Therapy (Signed)
[facility] [address]  [address] [place], [place], [address] Phone: [phone]   Fax:  [phone]  Physical Therapy Evaluation  Patient Details  Name: Joy [name] MRN: [id number] Date of Birth: 1940-11-12 Referring Provider: Gus Rankin. Aluisio, MD  Encounter Date: 01/06/2016      PT End of Session - 01/06/16 1045    Visit Number 1   Number of Visits 20   Date for PT Re-Evaluation 03/02/16   PT Start Time 0853   PT Stop Time 0935   PT Time Calculation (min) 42 min   Activity Tolerance Patient limited by pain   Behavior During Therapy [facility] for tasks assessed/performed      Past Medical History  Diagnosis Date  . CTS (carpal tunnel syndrome)   . Hypertension   . Hypercholesteremia   . Osteoporosis   . Bronchitis   . Arthritis     RHEUMATOID  . RA (rheumatoid arthritis) (HCC)   . Osteoarthritis   . Essential hypertension 11/24/2015  . Hyperlipidemia 11/24/2015  . Rheumatoid arthritis (HCC) 11/24/2015  . Dysrhythmia     "irregularity" unknown at this time - being evaluated by cardiology  . Seasonal allergies   . GERD (gastroesophageal reflux disease)   . OAB (overactive bladder)   . IBS (irritable bowel syndrome)     Past Surgical History  Procedure Laterality Date  . Back surgery    . Tonsillectomy    . Hernia repair      RIGHT ING.  . Breast surgery      REDUCTION  . Cataract extraction w/phaco  08/01/2012    Procedure: CATARACT EXTRACTION PHACO AND INTRAOCULAR LENS PLACEMENT (IOC);  Surgeon: Chalmers Guest, MD;  Location: [facility] OR;  Service: Ophthalmology;  Laterality: Right;  . Joint replacement  2014    rt total knee  . Abdominal hysterectomy  1995  . 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       Subjective Assessment -  01/06/16 0901    Subjective Pt w/ progressive h/o L knee OA complicated by tibial plateau fracture. MD decided to proceed with surgery and pt underwent L TKA on 12/26/15. Initial post-op course and HH PT proceeded well until the last few days when pain has significantly increased. Had weaned to cane with [facility] PT this week (HH PT ended yesterday) but presents to therapy with walker due to increased pain and feeling of insecurity.   Pertinent History 12/26/15 - L TKA; R TKA in ~2006/2007; Surgery for spinal stenosis ~2004   Currently in Pain? Yes   Pain Score 3   Least 1/10, Avg 3-4/10, Worst 8-10/10   Pain Location Knee   Pain Orientation Left;Anterior;Distal;Lateral;Medial   Pain Descriptors / Indicators Discomfort   Pain Type Surgical pain   Pain Radiating Towards n/a   Pain Onset 1 to 4 weeks ago   Pain Frequency Intermittent   Aggravating Factors  certain positions   Pain Relieving Factors Pain meds, muscle relaxants, ice   Effect of Pain on Daily Activities Limits walking tolerance            [facility] PT Assessment - 01/06/16 0853    Assessment   Medical Diagnosis L TKA   Referring Provider Gus Rankin. Aluisio, MD   Onset Date/Surgical Date 12/26/15  Next MD Visit 01/09/16   Prior Therapy HH PT 12/29/15-01/05/16   Balance Screen   Has the patient fallen in the past 6 months Yes   How many times? 1   Has the patient had a decrease in activity level because of a fear of falling?  No   Is the patient reluctant to leave their home because of a fear of falling?  No   Home Tourist information centre manager residence   Living Arrangements Children;Other relatives   Type of Home House   Home Access Stairs to enter   Entrance Stairs-Number of Steps 7   Entrance Stairs-Rails Right;Left;Can reach both   Home Layout Two level;Bed/bath upstairs   Alternate Level Stairs-Number of Steps 13   Alternate Level Stairs-Rails Right;Left   Home Equipment Walker - 2 wheels;Cane - single  point;Bedside commode;Wheelchair - Manufacturing systems engineer   Prior Function   Level of Independence Independent   Vocation Retired   Leisure Work with church, Agricultural consultant at schools, Yoga & water aerobics   Observation/Other Assessments   Focus on Therapeutic Outcomes (FOTO)  Knee - 50% (50% limitation); Predicted 58% (42% limitation)   ROM / Strength   AROM / PROM / Strength AROM;PROM;Strength   AROM   AROM Assessment Site Knee   Right/Left Knee Left;Right   Right Knee Extension 5   Right Knee Flexion 118   Left Knee Extension 15   Left Knee Flexion 98   PROM   PROM Assessment Site Knee   Right/Left Knee Left   Left Knee Extension 10   Left Knee Flexion 100   Strength   Strength Assessment Site Hip;Knee   Right/Left Hip Right;Left   Right Hip Flexion 4+/5   Right Hip Extension 3+/5   Right Hip ABduction 4+/5   Right Hip ADduction 4/5   Left Hip Flexion 4/5   Left Hip Extension 3/5   Left Hip ABduction 3+/5   Left Hip ADduction 3+/5   Right/Left Knee Right;Left   Right Knee Flexion 4+/5   Right Knee Extension 4+/5   Left Knee Flexion 3+/5   Left Knee Extension 4-/5   Flexibility   Soft Tissue Assessment /Muscle Length yes   Hamstrings mild tightness bilaterally   Quadriceps moderately tight on L, esp RF   Palpation   Patella mobility limited by pain/edema   Palpation comment ttp over anterior knee, distal med & lat   Ambulation/Gait   Assistive device Rolling walker   Gait Pattern Step-through pattern;Decreased hip/knee flexion - left;Decreased weight shift to left;Left flexed knee in stance;Trunk flexed               PT Education - 01/06/16 1043    Education provided Yes   Education Details PT eval findings & POC; Pt to continue with HH HEP until reviewed/updated at next visit   Person(s) Educated Patient   Methods Explanation   Comprehension Verbalized understanding          PT Short Term Goals - 01/06/16 1051    PT SHORT TERM GOAL #1   Title  Independent with initial HEP by 01/20/16   Status New   PT SHORT TERM GOAL #2   Title Pt will demonstrate proper gait pattern with SPC by 01/20/16   Status New           PT Long Term Goals - 01/06/16 1053    PT LONG TERM GOAL #1   Title Independent with advanced HEP +/- gym program by 03/02/16  Status New   PT LONG TERM GOAL #2   Title Pt will ambulate with normal gait pattern w/o AD by 03/02/16   Status New   PT LONG TERM GOAL #3   Title Pt will ascend/descend stairs reciprocally with normal step pattern by 03/02/16   Status New   PT LONG TERM GOAL #4   Title L knee AROM 5-120 dg or greater by 03/02/16   Status New   PT LONG TERM GOAL #5   Title L hip and knee strength 4+/5 or greater by 03/02/16   Status New               Plan - 2016-01-12 1047    Clinical Impression Statement Joy [name] is a 75 y/o female who presents to OP PT 11 days s/p L TKR on 12/26/15. Pt with 2 day acute care stay followed by 7 days of HH PT ending yesterday. Pt reports having weaned to [facility] with ambulation earlier this week with the [facility] PT but presents to therapy today continuing to use to RW due flare-up of pain over the past few days and uncertainty with cane for traveling outside the home. She demonstrates an antalgic gait pattern with decreased step length, decreased hip and knee flexion during swing through, and decreased heel strike and knee extension on weight acceptance with knee flexed during stance phase of gait. Pain currently 3/10 but has been up to 8-10/10 in past few days with pt reporting pain limiting tolerance for standing, walking and sitting with leg in dependent position. Assessment reveals L knee AROM 15-98 and PROM 10-100. Tightness noted in B hamstrings, quads and gastrocs, L > R, along with decreased patellar mobility. L hip strength 4/5 for flexion, 3/5 for extension and 3+/5 for ABD/ADD, with L knee 3+/5 for flexion and 4-/5 for extension within available range. POC will focus on improving LE  soft tissue pliability, increasing L knee ROM, core/LE strengthening and stability training, gait training working on weaning from AD(s) with manual therapy and modalities PRN for pain.   Rehab Potential Good   PT Frequency 3x / week  decreasing to 2x/wk after 4 wks   PT Duration 8 weeks   PT Treatment/Interventions Patient/family education;Therapeutic exercise;Manual techniques;Passive range of motion;Scar mobilization;Therapeutic activities;Functional mobility training;Gait training;Stair training;Neuromuscular re-education;Balance training;Cryotherapy;Vasopneumatic Device;Electrical Stimulation;ADLs/Self Care Home Management   PT Next Visit Plan Review & update HH HEP; Gait training with SPC; TKR Protocol; Manual therapy for increased ROM; Modalities PRN for pain/edema   Consulted and Agree with Plan of Care Patient      Patient will benefit from skilled therapeutic intervention in order to improve the following deficits and impairments:  Pain, Decreased range of motion, Impaired flexibility, Decreased strength, Difficulty walking, Abnormal gait, Decreased balance, Decreased activity tolerance, Decreased scar mobility  Visit Diagnosis: Stiffness of left knee, not elsewhere classified  Pain in left knee  Difficulty in walking, not elsewhere classified  Other abnormalities of gait and mobility      G-Codes - 01/12/2016 1046    Functional Assessment Tool Used Knee FOTO - 50% (50% limitation)   Functional Limitation Mobility: Walking and moving around   Mobility: Walking and Moving Around Current Status (K4818) At least 40 percent but less than 60 percent impaired, limited or restricted   Mobility: Walking and Moving Around Goal Status (H6314) At least 40 percent but less than 60 percent impaired, limited or restricted  Predicted Knee FOTO - 58% (42% limitation)       Problem List  Patient Active Problem List   Diagnosis Date Noted  . OA (osteoarthritis) of knee 12/26/2015  .  Essential hypertension 11/24/2015  . Hyperlipidemia 11/24/2015  . Rheumatoid arthritis (HCC) 11/24/2015  . Lumbar spondylosis 07/21/2014    Marry Guan, PT, MPT 01/06/2016, 12:38 PM  [facility] [address]  [address] [place], [place], [address] Phone: [phone]   Fax:  [phone]  Name: Joy [name] MRN: [id number] Date of Birth: 1941/07/24

## 2016-01-10 DIAGNOSIS — Z96652 Presence of left artificial knee joint: Secondary | ICD-10-CM | POA: Diagnosis not present

## 2016-01-10 DIAGNOSIS — Z471 Aftercare following joint replacement surgery: Secondary | ICD-10-CM | POA: Diagnosis not present

## 2016-01-11 ENCOUNTER — Encounter: Payer: Self-pay | Admitting: Physical Therapy

## 2016-01-11 ENCOUNTER — Ambulatory Visit: Payer: Medicare Other | Admitting: Physical Therapy

## 2016-01-11 DIAGNOSIS — R2689 Other abnormalities of gait and mobility: Secondary | ICD-10-CM | POA: Diagnosis not present

## 2016-01-11 DIAGNOSIS — M25562 Pain in left knee: Secondary | ICD-10-CM

## 2016-01-11 DIAGNOSIS — M25662 Stiffness of left knee, not elsewhere classified: Secondary | ICD-10-CM | POA: Diagnosis not present

## 2016-01-11 DIAGNOSIS — R262 Difficulty in walking, not elsewhere classified: Secondary | ICD-10-CM

## 2016-01-11 NOTE — Patient Instructions (Signed)
Balance: Unilateral   Do These next to a counter top for safety.  Hold on as needed.     Attempt to balance on left leg, eyes open. Hold _as long as able___ seconds. Repeat __5-10__ times per set. Do __1__ sets per session. Do _2-3___ sessions per day. Perform exercise with eyes closed.  Balance: Three-Way Leg Swing    Stand on left foot, hands on hips. Reach other foot forward _1___ times, sideways __1__ times, back __1__ times. Hold each position __1__ seconds. Relax. Repeat _10___ times per set. Do _2___ sets per session. Do _2-3___ sessions per day.  Copyright  VHI. All rights reserved.

## 2016-01-11 NOTE — Therapy (Signed)
[facility] [address]  [place], [place], [address] Phone: [phone]   Fax:  [phone]  Physical Therapy Treatment  Patient Details  Name: Joy [name] MRN: [id number] Date of Birth: 1941-08-19 Referring Provider: Dr Wynelle Link  Encounter Date: 01/11/2016      PT End of Session - 01/11/16 0926    Visit Number 2   Number of Visits 20   Date for PT Re-Evaluation 03/02/16   PT Start Time 0926   PT Stop Time 1030   PT Time Calculation (min) 64 min      Past Medical History  Diagnosis Date  . CTS (carpal tunnel syndrome)   . Hypertension   . Hypercholesteremia   . Osteoporosis   . Bronchitis   . Arthritis     RHEUMATOID  . RA (rheumatoid arthritis) ([place])   . Osteoarthritis   . Essential hypertension 11/24/2015  . Hyperlipidemia 11/24/2015  . Rheumatoid arthritis ([place]) 11/24/2015  . Dysrhythmia     "irregularity" unknown at this time - being evaluated by cardiology  . Seasonal allergies   . GERD (gastroesophageal reflux disease)   . OAB (overactive bladder)   . IBS (irritable bowel syndrome)     Past Surgical History  Procedure Laterality Date  . Back surgery    . Tonsillectomy    . Hernia repair      RIGHT ING.  . Breast surgery      REDUCTION  . Cataract extraction w/phaco  08/01/2012    Procedure: CATARACT EXTRACTION PHACO AND INTRAOCULAR LENS PLACEMENT (IOC);  Surgeon: Marylynn Pearson, MD;  Location: [facility];  Service: Ophthalmology;  Laterality: Right;  . Joint replacement  2014    rt total knee  . Abdominal hysterectomy  1995  . Total knee arthroplasty Left 12/26/2015    Procedure: TOTAL KNEE ARTHROPLASTY;  Surgeon: Gaynelle Arabian, MD;  Location: [facility];  Service: Orthopedics;  Laterality: Left;    There were no vitals filed for this visit.      Subjective Assessment - 01/11/16 0927    Subjective Patient saw the MD yesterday, he is pleased with her motion, he did draw off a bunch of fluid.      Currently in Pain? Yes   Pain Score 7    Pain Location Knee   Pain Orientation Left   Pain Descriptors / Indicators Sharp;Aching   Pain Type Surgical pain   Pain Radiating Towards back of the leg   Pain Onset 1 to 4 weeks ago   Pain Frequency Constant   Aggravating Factors  random activities   Pain Relieving Factors ice, medication            [facility] PT Assessment - 01/11/16 0001    Assessment   Medical Diagnosis L TKA   Referring Provider Dr Wynelle Link   Onset Date/Surgical Date 12/26/15   Next MD Visit 01/09/16   ROM / Strength   AROM / PROM / Strength AROM   AROM   AROM Assessment Site Knee   Right/Left Knee Left   Left Knee Extension 7   Left Knee Flexion 105                     [facility] Adult PT Treatment/Exercise - 01/11/16 0001    Exercises   Exercises Knee/Hip   Knee/Hip Exercises: Aerobic   Recumbent Bike for ROM Lt knee, able to make full revolutions   Knee/Hip Exercises: Standing   SLS Lt  LE with PRN HHA   then with Rt toe taps, FWD/side/BWD   Knee/Hip Exercises: Seated   Long Arc Quad Strengthening;Left;3 sets;10 reps;Weights   Long Arc Quad Weight 2 lbs.   Heel Slides Strengthening;Left;3 sets;10 reps  green band   Knee/Hip Exercises: Supine   Quad Sets AAROM;Left;10 reps  with foot elevated.    Straight Leg Raises Strengthening;Left;3 sets  8 reps   Modalities   Modalities Vasopneumatic   Vasopneumatic   Number Minutes Vasopneumatic  15 minutes   Vasopnuematic Location  Knee  Lt   Vasopneumatic Pressure Medium   Vasopneumatic Temperature  2*   Manual Therapy   Manual Therapy Joint mobilization   Joint Mobilization grade III lt knee ext mobs                PT Education - 01/11/16 0944    Education provided Yes   Education Details SLS    Person(s) Educated Patient   Methods Explanation;Demonstration;Handout   Comprehension Returned demonstration          PT Short Term Goals - 01/11/16 0932    PT SHORT TERM GOAL #1    Title Independent with initial HEP by 01/20/16   Status On-going   PT SHORT TERM GOAL #2   Title Pt will demonstrate proper gait pattern with SPC by 01/20/16   Status On-going           PT Long Term Goals - 01/11/16 0932    PT LONG TERM GOAL #1   Title Independent with advanced HEP +/- gym program by 03/02/16   Status On-going   PT LONG TERM GOAL #2   Title Pt will ambulate with normal gait pattern w/o AD by 03/02/16   Status On-going   PT LONG TERM GOAL #3   Title Pt will ascend/descend stairs reciprocally with normal step pattern by 03/02/16   Status On-going   PT LONG TERM GOAL #4   Title L knee AROM 5-120 dg or greater by 03/02/16   Status On-going   PT LONG TERM GOAL #5   Title L hip and knee strength 4+/5 or greater by 03/02/16   Status On-going               Plan - 01/11/16 1015    Clinical Impression Statement This is Joy [name]'s second visit, no goals met however demo'd increased ROM.  Swelling in her Lt knee continues to be an issue.    Rehab Potential Good   PT Frequency 3x / week   PT Duration 8 weeks   PT Treatment/Interventions Patient/family education;Therapeutic exercise;Manual techniques;Passive range of motion;Scar mobilization;Therapeutic activities;Functional mobility training;Gait training;Stair training;Neuromuscular re-education;Balance training;Cryotherapy;Vasopneumatic Device;Electrical Stimulation;ADLs/Self Care Home Management   PT Next Visit Plan Review & update HH HEP; Gait training with SPC; TKR Protocol; Manual therapy for increased ROM; Modalities PRN for pain/edema   Consulted and Agree with Plan of Care Patient      Patient will benefit from skilled therapeutic intervention in order to improve the following deficits and impairments:  Pain, Decreased range of motion, Impaired flexibility, Decreased strength, Difficulty walking, Abnormal gait, Decreased balance, Decreased activity tolerance, Decreased scar mobility  Visit Diagnosis: Pain in  left knee  Difficulty in walking, not elsewhere classified  Other abnormalities of gait and mobility  Stiffness of left knee, not elsewhere classified     Problem List Patient Active Problem List   Diagnosis Date Noted  . OA (osteoarthritis) of knee 12/26/2015  . Essential hypertension 11/24/2015  .  Hyperlipidemia 11/24/2015  . Rheumatoid arthritis ([place]) 11/24/2015  . Lumbar spondylosis 07/21/2014    Jeral Pinch PT  01/11/2016, 10:17 AM  [facility] [address]  [place], [place], [address] Phone: [phone]   Fax:  [phone]  Name: Joy [name] MRN: [id number] Date of Birth: 12-15-1940

## 2016-01-13 ENCOUNTER — Ambulatory Visit (INDEPENDENT_AMBULATORY_CARE_PROVIDER_SITE_OTHER): Payer: Medicare Other | Admitting: Physical Therapy

## 2016-01-13 ENCOUNTER — Encounter: Payer: Self-pay | Admitting: Physical Therapy

## 2016-01-13 DIAGNOSIS — R2689 Other abnormalities of gait and mobility: Secondary | ICD-10-CM

## 2016-01-13 DIAGNOSIS — M25562 Pain in left knee: Secondary | ICD-10-CM

## 2016-01-13 DIAGNOSIS — M25662 Stiffness of left knee, not elsewhere classified: Secondary | ICD-10-CM | POA: Diagnosis not present

## 2016-01-13 DIAGNOSIS — R262 Difficulty in walking, not elsewhere classified: Secondary | ICD-10-CM

## 2016-01-13 NOTE — Therapy (Signed)
[facility] [address]  [address] [place], [place], [address] Phone: [phone]   Fax:  [phone]  Physical Therapy Treatment  Patient Details  Name: Joy [name] MRN: [id number] Date of Birth: 22-Sep-1940 Referring Provider: Dr Lequita Halt  Encounter Date: 01/13/2016      PT End of Session - 01/13/16 0924    Visit Number 3   Number of Visits 20   Date for PT Re-Evaluation 03/02/16   PT Start Time 0924   PT Stop Time 1027   PT Time Calculation (min) 63 min   Activity Tolerance Patient tolerated treatment well      Past Medical History  Diagnosis Date  . CTS (carpal tunnel syndrome)   . Hypertension   . Hypercholesteremia   . Osteoporosis   . Bronchitis   . Arthritis     RHEUMATOID  . RA (rheumatoid arthritis) (HCC)   . Osteoarthritis   . Essential hypertension 11/24/2015  . Hyperlipidemia 11/24/2015  . Rheumatoid arthritis (HCC) 11/24/2015  . Dysrhythmia     "irregularity" unknown at this time - being evaluated by cardiology  . Seasonal allergies   . GERD (gastroesophageal reflux disease)   . OAB (overactive bladder)   . IBS (irritable bowel syndrome)     Past Surgical History  Procedure Laterality Date  . Back surgery    . Tonsillectomy    . Hernia repair      RIGHT ING.  . Breast surgery      REDUCTION  . Cataract extraction w/phaco  08/01/2012    Procedure: CATARACT EXTRACTION PHACO AND INTRAOCULAR LENS PLACEMENT (IOC);  Surgeon: Chalmers Guest, MD;  Location: [facility] OR;  Service: Ophthalmology;  Laterality: Right;  . Joint replacement  2014    rt total knee  . Abdominal hysterectomy  1995  . 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      Subjective Assessment - 01/13/16 0924    Subjective Pt reports she is sore today.    Currently in Pain? Yes   Pain Score 6    Pain Location Knee   Pain Orientation Left   Pain Descriptors / Indicators Aching;Sharp   Pain Type Surgical pain                         [facility] Adult PT Treatment/Exercise - 01/13/16 0001    Knee/Hip Exercises: Stretches   Passive Hamstring Stretch Left;2 reps;30 seconds  second set with knee presses   Other Knee/Hip Stretches ITB stretch with strap, cross body   Knee/Hip Exercises: Aerobic   Recumbent Bike for ROM Lt knee, able to make full revolutions   Knee/Hip Exercises: Standing   Forward Step Up Left;2 sets;10 reps;Step Height: 4";Step Height: 2"  first set on4", second on 2" step   Other Standing Knee Exercises TKE Lt with ball behind knee 15x 5 sec holds   Other Standing Knee Exercises standing on blue foam, head turns side/side, up/down.    Knee/Hip Exercises: Seated   Heel Slides Left;10 reps  self mobs pulling foot back.    Knee/Hip Exercises: Supine   Straight Leg Raises Left  3x5long sit SLR   Knee/Hip Exercises: Prone   Hamstring Curl 15 reps  then stretching into knee ext   Modalities  Modalities Vasopneumatic   Vasopneumatic   Number Minutes Vasopneumatic  15 minutes   Vasopnuematic Location  Knee   Vasopneumatic Pressure Medium   Vasopneumatic Temperature  2*                  PT Short Term Goals - 01/11/16 0932    PT SHORT TERM GOAL #1   Title Independent with initial HEP by 01/20/16   Status On-going   PT SHORT TERM GOAL #2   Title Pt will demonstrate proper gait pattern with SPC by 01/20/16   Status On-going           PT Long Term Goals - 01/11/16 0932    PT LONG TERM GOAL #1   Title Independent with advanced HEP +/- gym program by 03/02/16   Status On-going   PT LONG TERM GOAL #2   Title Pt will ambulate with normal gait pattern w/o AD by 03/02/16   Status On-going   PT LONG TERM GOAL #3   Title Pt will ascend/descend stairs reciprocally with normal step pattern by 03/02/16    Status On-going   PT LONG TERM GOAL #4   Title L knee AROM 5-120 dg or greater by 03/02/16   Status On-going   PT LONG TERM GOAL #5   Title L hip and knee strength 4+/5 or greater by 03/02/16   Status On-going               Plan - 01/13/16 1013    Clinical Impression Statement knee motion continues to improve, she felt a little less stable today so we ambulated with the RW.    Rehab Potential Good   PT Frequency 3x / week   PT Duration 8 weeks   PT Treatment/Interventions Patient/family education;Therapeutic exercise;Manual techniques;Passive range of motion;Scar mobilization;Therapeutic activities;Functional mobility training;Gait training;Stair training;Neuromuscular re-education;Balance training;Cryotherapy;Vasopneumatic Device;Electrical Stimulation;ADLs/Self Care Home Management   PT Next Visit Plan progress HEP proprioception, knee ROM   Consulted and Agree with Plan of Care Patient      Patient will benefit from skilled therapeutic intervention in order to improve the following deficits and impairments:  Pain, Decreased range of motion, Impaired flexibility, Decreased strength, Difficulty walking, Abnormal gait, Decreased balance, Decreased activity tolerance, Decreased scar mobility  Visit Diagnosis: Pain in left knee  Difficulty in walking, not elsewhere classified  Other abnormalities of gait and mobility  Stiffness of left knee, not elsewhere classified     Problem List Patient Active Problem List   Diagnosis Date Noted  . OA (osteoarthritis) of knee 12/26/2015  . Essential hypertension 11/24/2015  . Hyperlipidemia 11/24/2015  . Rheumatoid arthritis (HCC) 11/24/2015  . Lumbar spondylosis 07/21/2014    Roderic Scarce PT 01/13/2016, 11:17 AM  [facility] [address]  [address] [place], [place], [address] Phone: [phone]   Fax:  [phone]  Name: Joy [name] MRN: [id number] Date of Birth:  13-Oct-1940

## 2016-01-18 ENCOUNTER — Ambulatory Visit: Payer: Medicare Other

## 2016-01-18 DIAGNOSIS — M25662 Stiffness of left knee, not elsewhere classified: Secondary | ICD-10-CM

## 2016-01-18 DIAGNOSIS — R262 Difficulty in walking, not elsewhere classified: Secondary | ICD-10-CM

## 2016-01-18 DIAGNOSIS — M25562 Pain in left knee: Secondary | ICD-10-CM | POA: Diagnosis not present

## 2016-01-18 DIAGNOSIS — R2689 Other abnormalities of gait and mobility: Secondary | ICD-10-CM | POA: Diagnosis not present

## 2016-01-19 ENCOUNTER — Ambulatory Visit: Payer: Medicare Other | Attending: Orthopedic Surgery

## 2016-01-19 DIAGNOSIS — R262 Difficulty in walking, not elsewhere classified: Secondary | ICD-10-CM | POA: Diagnosis not present

## 2016-01-19 DIAGNOSIS — R2689 Other abnormalities of gait and mobility: Secondary | ICD-10-CM | POA: Diagnosis not present

## 2016-01-19 DIAGNOSIS — M25562 Pain in left knee: Secondary | ICD-10-CM | POA: Diagnosis not present

## 2016-01-19 DIAGNOSIS — M25662 Stiffness of left knee, not elsewhere classified: Secondary | ICD-10-CM | POA: Diagnosis not present

## 2016-01-19 NOTE — Therapy (Addendum)
[facility] [address]  [place], [place], [address] Phone: [phone]   Fax:  [phone]  Physical Therapy Treatment  Patient Details  Name: Joy [name] MRN: [id number] Date of Birth: Mar 03, 1941 Referring Provider: Dr Wynelle Link  Encounter Date: 01/18/2016      PT End of Session - 01/19/16 1431    Visit Number 5   Number of Visits 20   Date for PT Re-Evaluation 02/06/16   Authorization Type POC by MD 3x / wk for 4 wks    PT Start Time 1410   PT Stop Time 1450   PT Time Calculation (min) 40 min   Activity Tolerance Patient tolerated treatment well   Behavior During Therapy [facility] for tasks assessed/performed      Past Medical History  Diagnosis Date  . CTS (carpal tunnel syndrome)   . Hypertension   . Hypercholesteremia   . Osteoporosis   . Bronchitis   . Arthritis     RHEUMATOID  . RA (rheumatoid arthritis) ([place])   . Osteoarthritis   . Essential hypertension 11/24/2015  . Hyperlipidemia 11/24/2015  . Rheumatoid arthritis ([place]) 11/24/2015  . Dysrhythmia     "irregularity" unknown at this time - being evaluated by cardiology  . Seasonal allergies   . GERD (gastroesophageal reflux disease)   . OAB (overactive bladder)   . IBS (irritable bowel syndrome)     Past Surgical History  Procedure Laterality Date  . Back surgery    . Tonsillectomy    . Hernia repair      RIGHT ING.  . Breast surgery      REDUCTION  . Cataract extraction w/phaco  08/01/2012    Procedure: CATARACT EXTRACTION PHACO AND INTRAOCULAR LENS PLACEMENT (IOC);  Surgeon: Marylynn Pearson, MD;  Location: [facility];  Service: Ophthalmology;  Laterality: Right;  . Joint replacement  2014    rt total knee  . Abdominal hysterectomy  1995  . 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      Subjective Assessment - 01/19/16 1417    Subjective Pt. reports she is currently 2/10 L soreness   Currently in Pain? Yes   Pain Score 2    Pain Location Knee   Pain Orientation Left   Pain Descriptors / Indicators Sore   Pain Type Surgical pain   Pain Onset 1 to 4 weeks ago   Pain Frequency Constant            [facility] PT Assessment - 01/18/16 1424    AROM   AROM Assessment Site Knee   Right/Left Knee Left   Left Knee Extension 7   Left Knee Flexion 117      Today's Treatment:  Therex: NuStep: level 3, 4 min L HS, SKTC stretch x 30 sec each  Manual:  L knee posterior / anterior mobs  Therex: Bridging x 10 reps  HS curl with heels on peanut p-ball x 15 reps Bridge with heels on peanut p-ball x 10 reps with B quad set  B supine hip ER with black TB around knees x 10 reps  Alternating hip abd/ER with black TB around knees x 15 reps each side  Standing TKE with black TB in doors x 15 reps Seated fitter L knee extension (1 black, 1  blue) x 15 reps   Vasoneumatic device: L knee, 15 min, coldest temp., medium compression, L LE elevated       PT Education - 01/19/16 1514    Education provided Yes   Education Details hip / LE strengthening and flexibility HEP   Person(s) Educated Patient   Methods Explanation;Verbal cues;Handout   Comprehension Verbalized understanding;Need further instruction;Verbal cues required          PT Short Term Goals - 01/18/16 1430    PT SHORT TERM GOAL #1   Title Independent with initial HEP by 01/20/16      Status Partially Met  01/18/16: pt. independent with initial HEp however more current HEP recently added.    PT SHORT TERM GOAL #2   Title Pt will demonstrate proper gait pattern with SPC by 01/20/16   Status Achieved  01/18/16: Pt. able to demo proper gait pattern with SPC.            PT Long Term Goals - 01/11/16 0932    PT LONG TERM GOAL #1   Title Independent with advanced HEP +/- gym program by  03/02/16   Status On-going   PT LONG TERM GOAL #2   Title Pt will ambulate with normal gait pattern w/o AD by 03/02/16   Status On-going   PT LONG TERM GOAL #3   Title Pt will ascend/descend stairs reciprocally with normal step pattern by 03/02/16   Status On-going   PT LONG TERM GOAL #4   Title L knee AROM 5-120 dg or greater by 03/02/16   Status On-going   PT LONG TERM GOAL #5   Title L hip and knee strength 4+/5 or greater by 03/02/16   Status On-going               Plan - 01/19/16 1431    Clinical Impression Statement Pt. performed very well with all hip / knee strengthening activitiy today with TKE strengthening focus today and introduction of fitter knee extension activity.  Pt. 2/10 L knee pain initially today and pain remained unchanged throughout therex.  LE / hip strengthening and stretching HEP added today with handout issued to pt.    PT Treatment/Interventions Patient/family education;Therapeutic exercise;Manual techniques;Passive range of motion;Scar mobilization;Therapeutic activities;Functional mobility training;Gait training;Stair training;Neuromuscular re-education;Balance training;Cryotherapy;Vasopneumatic Device;Electrical Stimulation;ADLs/Self Care Home Management   PT Next Visit Plan progress HEP; proprioception, knee ROM      Patient will benefit from skilled therapeutic intervention in order to improve the following deficits and impairments:  Pain, Decreased range of motion, Impaired flexibility, Decreased strength, Difficulty walking, Abnormal gait, Decreased balance, Decreased activity tolerance, Decreased scar mobility  Visit Diagnosis: Pain in left knee  Difficulty in walking, not elsewhere classified  Other abnormalities of gait and mobility  Stiffness of left knee, not elsewhere classified     Problem List Patient Active Problem List   Diagnosis Date Noted  . OA (osteoarthritis) of knee 12/26/2015  . Essential hypertension 11/24/2015  .  Hyperlipidemia 11/24/2015  . Rheumatoid arthritis ([place]) 11/24/2015  . Lumbar spondylosis 07/21/2014    Bess Harvest, PTA 01/19/2016, 4:41 PM  [facility] [address]  [place], [place], [address] Phone: [phone]   Fax:  [phone]  Name: Joy [name] MRN: [id number] Date of Birth: [birth year]

## 2016-01-19 NOTE — Therapy (Addendum)
[facility] [address]  [place], [place], [address] Phone: [phone]   Fax:  [phone]  Physical Therapy Treatment  Patient Details  Name: Joy [name] MRN: [id number] Date of Birth: February 16, 1941 Referring Provider: Dr Wynelle Link  Encounter Date: 01/19/2016      PT End of Session - 01/19/16 1431    Visit Number 5   Number of Visits 20   Date for PT Re-Evaluation 02/06/16   Authorization Type POC by MD 3x / wk for 4 wks    PT Start Time 1410   PT Stop Time 1450   PT Time Calculation (min) 40 min   Activity Tolerance Patient tolerated treatment well   Behavior During Therapy [facility] for tasks assessed/performed      Past Medical History  Diagnosis Date  . CTS (carpal tunnel syndrome)   . Hypertension   . Hypercholesteremia   . Osteoporosis   . Bronchitis   . Arthritis     RHEUMATOID  . RA (rheumatoid arthritis) ([place])   . Osteoarthritis   . Essential hypertension 11/24/2015  . Hyperlipidemia 11/24/2015  . Rheumatoid arthritis ([place]) 11/24/2015  . Dysrhythmia     "irregularity" unknown at this time - being evaluated by cardiology  . Seasonal allergies   . GERD (gastroesophageal reflux disease)   . OAB (overactive bladder)   . IBS (irritable bowel syndrome)     Past Surgical History  Procedure Laterality Date  . Back surgery    . Tonsillectomy    . Hernia repair      RIGHT ING.  . Breast surgery      REDUCTION  . Cataract extraction w/phaco  08/01/2012    Procedure: CATARACT EXTRACTION PHACO AND INTRAOCULAR LENS PLACEMENT (IOC);  Surgeon: Marylynn Pearson, MD;  Location: [facility];  Service: Ophthalmology;  Laterality: Right;  . Joint replacement  2014    rt total knee  . Abdominal hysterectomy  1995  . 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      Subjective Assessment - 01/19/16 1417    Subjective Pt. reports she is currently 2/10 L soreness.   Currently in Pain? Yes   Pain Score 2    Pain Location Knee   Pain Orientation Left   Pain Descriptors / Indicators Sore   Pain Type Surgical pain   Pain Onset 1 to 4 weeks ago   Pain Frequency Constant            [facility] PT Assessment - 01/18/16 1424    AROM   AROM Assessment Site Knee   Right/Left Knee Left   Left Knee Extension 7   Left Knee Flexion 117      Today's Treatment:  Therex: NuStep: level 3, 4 min L HS, SKTC stretch x 30 sec each Seated High knee marching with black TB x 10 reps each side  Bridging x 10 reps  HS curl with heels on peanut p-ball x 15 reps B seated hip ER with black TB around knees x 10 reps   Alternating hip abd/ER with black TB around knees x 15 reps each side  Seated L knee extension (1 black, 1 blue) x 15 reps Standing TKE with black TB in doors x 15 reps   Vasoneumatic device: L knee, 15 min,  coldest temp., medium compression, L LE elevated       PT Education - 01/19/16 1514    Education provided Yes   Education Details hip / LE strengthening and flexibility HEP   Person(s) Educated Patient   Methods Explanation;Verbal cues;Handout   Comprehension Verbalized understanding;Need further instruction;Verbal cues required          PT Short Term Goals - 01/18/16 1430    PT SHORT TERM GOAL #1   Title Independent with initial HEP by 01/20/16      Status Partially Met  01/18/16: pt. independent with initial HEp however more current HEP recently added.    PT SHORT TERM GOAL #2   Title Pt will demonstrate proper gait pattern with SPC by 01/20/16   Status Achieved  01/18/16: Pt. able to demo proper gait pattern with SPC.            PT Long Term Goals - 01/11/16 0932    PT LONG TERM GOAL #1   Title Independent with advanced HEP +/- gym program by 03/02/16   Status On-going   PT LONG TERM GOAL #2   Title Pt will  ambulate with normal gait pattern w/o AD by 03/02/16   Status On-going   PT LONG TERM GOAL #3   Title Pt will ascend/descend stairs reciprocally with normal step pattern by 03/02/16   Status On-going   PT LONG TERM GOAL #4   Title L knee AROM 5-120 dg or greater by 03/02/16   Status On-going   PT LONG TERM GOAL #5   Title L hip and knee strength 4+/5 or greater by 03/02/16   Status On-going               Plan - 01/19/16 1431    Clinical Impression Statement Pt. performed very well with all hip / knee strengthening activitiy today with TKE strengthening focus today and introduction of fitter knee extension activity.  Pt. 2/10 L knee pain initially today and pain remained unchanged throughout therex.  LE / hip strengthening and stretching HEP added today with handout issued to pt.    PT Treatment/Interventions Patient/family education;Therapeutic exercise;Manual techniques;Passive range of motion;Scar mobilization;Therapeutic activities;Functional mobility training;Gait training;Stair training;Neuromuscular re-education;Balance training;Cryotherapy;Vasopneumatic Device;Electrical Stimulation;ADLs/Self Care Home Management   PT Next Visit Plan progress HEP; proprioception, knee ROM      Patient will benefit from skilled therapeutic intervention in order to improve the following deficits and impairments:  Pain, Decreased range of motion, Impaired flexibility, Decreased strength, Difficulty walking, Abnormal gait, Decreased balance, Decreased activity tolerance, Decreased scar mobility  Visit Diagnosis: Pain in left knee  Difficulty in walking, not elsewhere classified  Other abnormalities of gait and mobility  Stiffness of left knee, not elsewhere classified     Problem List Patient Active Problem List   Diagnosis Date Noted  . OA (osteoarthritis) of knee 12/26/2015  . Essential hypertension 11/24/2015  . Hyperlipidemia 11/24/2015  . Rheumatoid arthritis ([place]) 11/24/2015  .  Lumbar spondylosis 07/21/2014    Bess Harvest, PTA 01/19/2016, 4:46 PM  [facility] [address]  [place], [place], [address] Phone: [phone]   Fax:  [phone]  Name: MACKINLEY CASSADAY MRN: [id number] Date of Birth: 1940-10-04

## 2016-01-23 ENCOUNTER — Ambulatory Visit: Payer: Medicare Other | Admitting: Physical Therapy

## 2016-01-23 DIAGNOSIS — M25562 Pain in left knee: Secondary | ICD-10-CM | POA: Diagnosis not present

## 2016-01-23 DIAGNOSIS — M25662 Stiffness of left knee, not elsewhere classified: Secondary | ICD-10-CM

## 2016-01-23 DIAGNOSIS — R262 Difficulty in walking, not elsewhere classified: Secondary | ICD-10-CM

## 2016-01-23 DIAGNOSIS — R2689 Other abnormalities of gait and mobility: Secondary | ICD-10-CM | POA: Diagnosis not present

## 2016-01-23 NOTE — Therapy (Signed)
[facility] [address]  [address] [place], [place], [address] Phone: [phone]   Fax:  [phone]  Physical Therapy Treatment  Patient Details  Name: Joy [name] MRN: [id number] Date of Birth: Jul 29, 1941 Referring Provider: Gus Rankin. Aluisio, MD  Encounter Date: 01/23/2016      PT End of Session - 01/23/16 1457    Visit Number 6   Number of Visits 20  Reassess after 12 visits (3x/wk x 4 wks) per MD   Date for PT Re-Evaluation 02/06/16   Authorization Type MD approved initial POC for 3x/wk for 4 wks, then reassess   PT Start Time 1447   PT Stop Time 1547   PT Time Calculation (min) 60 min   Activity Tolerance Patient tolerated treatment well;Patient limited by pain   Behavior During Therapy [facility] for tasks assessed/performed      Past Medical History  Diagnosis Date  . CTS (carpal tunnel syndrome)   . Hypertension   . Hypercholesteremia   . Osteoporosis   . Bronchitis   . Arthritis     RHEUMATOID  . RA (rheumatoid arthritis) ([facility])   . Osteoarthritis   . Essential hypertension 11/24/2015  . Hyperlipidemia 11/24/2015  . Rheumatoid arthritis ([facility]) 11/24/2015  . Dysrhythmia     "irregularity" unknown at this time - being evaluated by cardiology  . Seasonal allergies   . GERD (gastroesophageal reflux disease)   . OAB (overactive bladder)   . IBS (irritable bowel syndrome)     Past Surgical History  Procedure Laterality Date  . Back surgery    . Tonsillectomy    . Hernia repair      RIGHT ING.  . Breast surgery      REDUCTION  . Cataract extraction w/phaco  08/01/2012    Procedure: CATARACT EXTRACTION PHACO AND INTRAOCULAR LENS PLACEMENT (IOC);  Surgeon: Chalmers Guest, MD;  [facility]: [facility];  Service: Ophthalmology;  Laterality: Right;  . Joint replacement  2014    rt total knee  . Abdominal hysterectomy  1995  . 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      Subjective Assessment - 01/23/16 1454    Subjective Pt reports knee has been hurting more but thinks it's a combination of the inclement weather and having been more active over the weekend.   Currently in Pain? Yes   Pain Score 2    Pain Location Knee   Pain Orientation Left   Pain Descriptors / Indicators Sore;Aching            [facility] PT Assessment - 01/23/16 1447    Assessment   Medical Diagnosis L TKA   Referring Provider Gus Rankin. Aluisio, MD   Onset Date/Surgical Date 12/26/15   Next MD Visit 01/31/16   AROM   AROM Assessment Site Knee   Right/Left Knee Left   Left Knee Extension 8   Left Knee Flexion 117  in sitting; 126 in supine   PROM   PROM Assessment Site Knee   Right/Left Knee Left   Left Knee Extension 3   Left Knee Flexion 126           Today's Treatment  TherEx NuStep: level 3, 4 min L HS, SKTC stretch x 30" each HS curl with heels on peanut p-ball  x15  Straight Leg Bridge with feet on peanut ball - attempted but deferred d/t increased L lateral knee pain  Manual  STM/gentle strumming to L ITB in R sidelying - Pt with limited tolerance for STM  Kinesiotape - 1 "I" strip (30%) along L ITB from tibial tuberosity to mid lateral thigh  TherEx Bridge x10 R sidelying L Clam x15 R sidelying L Hip ABD with 2# x10 B SAQ over 8" foam bolster with 2# on L 2x10 L SLR with 2# 2x10 L Fitter seated leg press (1 black/1 blue) x15 L Seated HS curl with green TB x15   Vasoneumatic device: L knee, 10 min, coldest temp., medium compression, L LE elevated           PT Short Term Goals - 01/23/16 1716    PT SHORT TERM GOAL #1   Title Independent with initial HEP by 01/20/16      Status Achieved   PT SHORT TERM GOAL #2   Title Pt will demonstrate proper gait pattern with SPC by 01/20/16   Status Achieved           PT  Long Term Goals - 01/23/16 1717    PT LONG TERM GOAL #1   Title Independent with advanced HEP +/- gym program by 03/02/16   Status On-going   PT LONG TERM GOAL #2   Title Pt will ambulate with normal gait pattern w/o AD by 03/02/16   Status On-going   PT LONG TERM GOAL #3   Title Pt will ascend/descend stairs reciprocally with normal step pattern by 03/02/16   Status On-going   PT LONG TERM GOAL #4   Title L knee AROM 5-120 dg or greater by 03/02/16   Status On-going   PT LONG TERM GOAL #5   Title L hip and knee strength 4+/5 or greater by 03/02/16   Status On-going               Plan - 01/23/16 1704    Clinical Impression Statement Pt reporting no issues with updated HEP provided at last visit, but admits to limited compliance with completion of HEP. Pt reporting increased pain today which she attributes to combination of increased activity over the weekend and today's inclement weather, but pain rating unchaged from last visit at 2/10. Pt reporting increased lateral knee pain with attempt at straight leg bridge today with severe ttp noted over distal to mid ITB. Attempted STM to ITB but limited tolerance, therefore initiated trial of kinesiotaping to attempt to reduce irritation and will assess response at next visit. Avoided TB exercises where TB placed around lateral knees/lower thigh to avoid further irritation. L knee ROM improving with supine flexion AROM up to 126 but continues to lack TKE both supported and against gravity.   PT Treatment/Interventions Patient/family education;Therapeutic exercise;Manual techniques;Passive range of motion;Scar mobilization;Therapeutic activities;Functional mobility training;Gait training;Stair training;Neuromuscular re-education;Balance training;Cryotherapy;Vasopneumatic Device;Electrical Stimulation;ADLs/Self Care Home Management   PT Next Visit Plan Assess reponse to taping; L knee ROM with emphasis on full active extension; Hip/knee strenghtening  & proprioception training   Consulted and Agree with Plan of Care Patient      Patient will benefit from skilled therapeutic intervention in order to improve the following deficits and impairments:  Pain, Decreased range of motion, Impaired flexibility, Decreased strength, Difficulty walking, Abnormal gait, Decreased balance, Decreased activity tolerance, Decreased scar mobility  Visit Diagnosis: Pain in left knee  Difficulty in walking, not elsewhere classified  Other abnormalities of gait and  mobility  Stiffness of left knee, not elsewhere classified     Problem List Patient Active Problem List   Diagnosis Date Noted  . OA (osteoarthritis) of knee 12/26/2015  . Essential hypertension 11/24/2015  . Hyperlipidemia 11/24/2015  . Rheumatoid arthritis ([facility]) 11/24/2015  . Lumbar spondylosis 07/21/2014    Marry Guan, PT, MPT 01/23/2016, 5:21 PM  [facility] [address]  [address] [place], [place], [address] Phone: [phone]   Fax:  [phone]  Name: Joy [name] MRN: [id number] Date of Birth: 10/10/1940

## 2016-01-25 ENCOUNTER — Ambulatory Visit: Payer: Medicare Other

## 2016-01-25 DIAGNOSIS — M25662 Stiffness of left knee, not elsewhere classified: Secondary | ICD-10-CM

## 2016-01-25 DIAGNOSIS — R2689 Other abnormalities of gait and mobility: Secondary | ICD-10-CM

## 2016-01-25 DIAGNOSIS — M25562 Pain in left knee: Secondary | ICD-10-CM | POA: Diagnosis not present

## 2016-01-25 DIAGNOSIS — R262 Difficulty in walking, not elsewhere classified: Secondary | ICD-10-CM | POA: Diagnosis not present

## 2016-01-25 NOTE — Therapy (Signed)
[facility] [address]  [address] [place], [place], [address] Phone: [phone]   Fax:  [phone]  Physical Therapy Treatment  Patient Details  Name: Joy [name] MRN: [id number] Date of Birth: 1940-12-10 Referring Provider: Gus Rankin. Aluisio, MD  Encounter Date: 01/25/2016      PT End of Session - 01/25/16 1456    Visit Number 7   Number of Visits 20  Reassess after 12 visits (3x/wk x 4 wks) per MD   Date for PT Re-Evaluation 02/06/16   Authorization Type MD approved initial POC for 3x/wk for 4 wks, then reassess   PT Start Time 1402   PT Stop Time 1458   PT Time Calculation (min) 56 min   Activity Tolerance Patient tolerated treatment well   Behavior During Therapy [facility] for tasks assessed/performed      Past Medical History  Diagnosis Date  . CTS (carpal tunnel syndrome)   . Hypertension   . Hypercholesteremia   . Osteoporosis   . Bronchitis   . Arthritis     RHEUMATOID  . RA (rheumatoid arthritis) (HCC)   . Osteoarthritis   . Essential hypertension 11/24/2015  . Hyperlipidemia 11/24/2015  . Rheumatoid arthritis (HCC) 11/24/2015  . Dysrhythmia     "irregularity" unknown at this time - being evaluated by cardiology  . Seasonal allergies   . GERD (gastroesophageal reflux disease)   . OAB (overactive bladder)   . IBS (irritable bowel syndrome)     Past Surgical History  Procedure Laterality Date  . Back surgery    . Tonsillectomy    . Hernia repair      RIGHT ING.  . Breast surgery      REDUCTION  . Cataract extraction w/phaco  08/01/2012    Procedure: CATARACT EXTRACTION PHACO AND INTRAOCULAR LENS PLACEMENT (IOC);  Surgeon: Chalmers Guest, MD;  Location: [facility] OR;  Service: Ophthalmology;  Laterality: Right;  . Joint replacement  2014    rt total knee  . Abdominal hysterectomy  1995  . 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      Subjective Assessment - 01/25/16 1411    Subjective Pt. reports only L knee discomfort; pt. to see MD regarding her RA flare-up over the last few weeks on the 13th of this month.     Currently in Pain? No/denies   Pain Score 0-No pain   Multiple Pain Sites No      Today's Treatment:  TherEx: NuStep: level 4, 4 min L HS, SKTC stretch x 30" each B sidelying clam shell with black TB x 10 reps Straight Leg Bridge with feet on peanut ball x 10 reps Bridging x 15 reps each side  L TKE with black TB x 15 reps L Fitter seated leg press (1 black/1 blue) x 15 reps   BATCA HS curl machine 25# x 10 reps B concentric / eccentric  BATCA HS curl machine 20# x 10 reps; B concentric / L eccentric  Vasoneumatic device to L knee: L LE elevated, coldest temp., medium compression, 10 min, supine         PT Short Term Goals - 01/23/16 1716    PT SHORT TERM GOAL #1   Title Independent with initial HEP by 01/20/16  Status Achieved   PT SHORT TERM GOAL #2   Title Pt will demonstrate proper gait pattern with SPC by 01/20/16   Status Achieved           PT Long Term Goals - 01/23/16 1717    PT LONG TERM GOAL #1   Title Independent with advanced HEP +/- gym program by 03/02/16   Status On-going   PT LONG TERM GOAL #2   Title Pt will ambulate with normal gait pattern w/o AD by 03/02/16   Status On-going   PT LONG TERM GOAL #3   Title Pt will ascend/descend stairs reciprocally with normal step pattern by 03/02/16   Status On-going   PT LONG TERM GOAL #4   Title L knee AROM 5-120 dg or greater by 03/02/16   Status On-going   PT LONG TERM GOAL #5   Title L hip and knee strength 4+/5 or greater by 03/02/16   Status On-going               Plan - 01/25/16 1456    Clinical Impression Statement pt. with only mild discomfort at the L knee today; pt. reports the taping to L lateral knee  applied last treatment has decreased her swelling and pain; taping still well intact in today's treatment.  Pt. performed very well with all hip / knee strengthening and stretching activity with continued focus on TKE strengthening today.  Pt. requested taping to L knee in tomorrows treatment for continued relief over the weekend.     PT Treatment/Interventions Patient/family education;Therapeutic exercise;Manual techniques;Passive range of motion;Scar mobilization;Therapeutic activities;Functional mobility training;Gait training;Stair training;Neuromuscular re-education;Balance training;Cryotherapy;Vasopneumatic Device;Electrical Stimulation;ADLs/Self Care Home Management   PT Next Visit Plan continue taping per pt. tolerance to L lateral knee; L knee ROM with emphasis on full active extension; Hip/knee strenghtening & proprioception training      Patient will benefit from skilled therapeutic intervention in order to improve the following deficits and impairments:  Pain, Decreased range of motion, Impaired flexibility, Decreased strength, Difficulty walking, Abnormal gait, Decreased balance, Decreased activity tolerance, Decreased scar mobility  Visit Diagnosis: Pain in left knee  Difficulty in walking, not elsewhere classified  Other abnormalities of gait and mobility  Stiffness of left knee, not elsewhere classified     Problem List Patient Active Problem List   Diagnosis Date Noted  . OA (osteoarthritis) of knee 12/26/2015  . Essential hypertension 11/24/2015  . Hyperlipidemia 11/24/2015  . Rheumatoid arthritis (HCC) 11/24/2015  . Lumbar spondylosis 07/21/2014    Kermit Balo, PTA 01/25/2016, 3:04 PM  [facility] [address]  [address] [place], [place], [address] Phone: [phone]   Fax:  [phone]  Name: BITHA FAUTEUX MRN: [id number] Date of Birth: 11/19/1940

## 2016-01-26 ENCOUNTER — Ambulatory Visit: Payer: Medicare Other | Admitting: Physical Therapy

## 2016-01-26 DIAGNOSIS — M25662 Stiffness of left knee, not elsewhere classified: Secondary | ICD-10-CM | POA: Diagnosis not present

## 2016-01-26 DIAGNOSIS — R262 Difficulty in walking, not elsewhere classified: Secondary | ICD-10-CM

## 2016-01-26 DIAGNOSIS — R2689 Other abnormalities of gait and mobility: Secondary | ICD-10-CM | POA: Diagnosis not present

## 2016-01-26 DIAGNOSIS — M25562 Pain in left knee: Secondary | ICD-10-CM

## 2016-01-26 NOTE — Therapy (Signed)
[facility] [address]  [address] [place], [place], [address] Phone: [phone]   Fax:  [phone]  Physical Therapy Treatment  Patient Details  Name: Joy [name] MRN: [id number] Date of Birth: November 19, 1940 Referring Provider: Gus Rankin. Aluisio, MD  Encounter Date: 01/26/2016      PT End of Session - 01/26/16 1015    Visit Number 8   Number of Visits 20  Reassess after 12 visits (3x/wk x 4 wks) per MD   Date for PT Re-Evaluation 02/06/16   Authorization Type MD approved initial POC for 3x/wk for 4 wks, then reassess   PT Start Time 1015   Activity Tolerance Patient tolerated treatment well   Behavior During Therapy [facility] for tasks assessed/performed      Past Medical History  Diagnosis Date  . CTS (carpal tunnel syndrome)   . Hypertension   . Hypercholesteremia   . Osteoporosis   . Bronchitis   . Arthritis     RHEUMATOID  . RA (rheumatoid arthritis) (HCC)   . Osteoarthritis   . Essential hypertension 11/24/2015  . Hyperlipidemia 11/24/2015  . Rheumatoid arthritis (HCC) 11/24/2015  . Dysrhythmia     "irregularity" unknown at this time - being evaluated by cardiology  . Seasonal allergies   . GERD (gastroesophageal reflux disease)   . OAB (overactive bladder)   . IBS (irritable bowel syndrome)     Past Surgical History  Procedure Laterality Date  . Back surgery    . Tonsillectomy    . Hernia repair      RIGHT ING.  . Breast surgery      REDUCTION  . Cataract extraction w/phaco  08/01/2012    Procedure: CATARACT EXTRACTION PHACO AND INTRAOCULAR LENS PLACEMENT (IOC);  Surgeon: Chalmers Guest, MD;  Location: [facility] OR;  Service: Ophthalmology;  Laterality: Right;  . Joint replacement  2014    rt total knee  . Abdominal hysterectomy  1995  . 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      Subjective Assessment - 01/26/16 1021    Subjective Pt reports taping seemed to help with lateral knee pain, but still discouraged by the fact that knee seems to flare-up with any attmepts at increased activity, especially walking. States had a very busy day yesterday and pain was up to 8/10 this morning, currently 4-5/10.   Currently in Pain? Yes   Pain Score --  4-5/10 (up to 8/10 this morning)   Pain Location Knee   Pain Orientation Left   Pain Descriptors / Indicators Aching;Sore;Tightness           Today's Treatment  TherEx Rec Bike - lvl 1 x 5'  Manual  L HS, ITB stretches 2 x30" each Kinesiotape - 3 "I" strips    2 strips (30-50%) medial & lateral to patella crossing distal to tibial tuberosity   1 strip horizontally over inferior pole of patella to medial/lateral knee  TherEx Straight Leg Bridge with feet on peanut ball 5x3"  (discontinued due to increased posterior L knee pain) Alternating Quad set + Hip Extension isometric into peanut ball 10x3" HS curl with heels on peanut ball x15  L SAQ over 8" foam bolster 3# 2x10 L SLR with 3# x10  (only 1 set d/t c/o LBP) Hooklying Alternating  Hip ABD/ER with black TB x15 Bridge + Hip ABD isometric with black TB x10 BATCA HS curl machine 25# x20 B concentric/eccentric   Ice pack to L knee with LE elevated on bolster x10' (Vaso already in use)          PT Short Term Goals - 01/23/16 1716    PT SHORT TERM GOAL #1   Title Independent with initial HEP by 01/20/16      Status Achieved   PT SHORT TERM GOAL #2   Title Pt will demonstrate proper gait pattern with SPC by 01/20/16   Status Achieved           PT Long Term Goals - 01/23/16 1717    PT LONG TERM GOAL #1   Title Independent with advanced HEP +/- gym program by 03/02/16   Status On-going   PT LONG TERM GOAL #2   Title Pt will ambulate with normal gait pattern w/o AD by 03/02/16   Status On-going    PT LONG TERM GOAL #3   Title Pt will ascend/descend stairs reciprocally with normal step pattern by 03/02/16   Status On-going   PT LONG TERM GOAL #4   Title L knee AROM 5-120 dg or greater by 03/02/16   Status On-going   PT LONG TERM GOAL #5   Title L hip and knee strength 4+/5 or greater by 03/02/16   Status On-going               Plan - 01/25/16 1456    Clinical Impression Statement pt. with only mild discomfort at the L knee today; pt. reports the taping to L lateral knee applied last treatment has decreased her swelling and pain; taping still well intact in today's treatment.  Pt. performed very well with all hip / knee strengthening and stretching activity with continued focus on TKE strengthening today.  Pt. requested taping to L knee in tomorrows treatment for continued relief over the weekend.     PT Treatment/Interventions Patient/family education;Therapeutic exercise;Manual techniques;Passive range of motion;Scar mobilization;Therapeutic activities;Functional mobility training;Gait training;Stair training;Neuromuscular re-education;Balance training;Cryotherapy;Vasopneumatic Device;Electrical Stimulation;ADLs/Self Care Home Management   PT Next Visit Plan continue taping per pt. tolerance to L lateral knee; L knee ROM with emphasis on full active extension; Hip/knee strenghtening & proprioception training      Patient will benefit from skilled therapeutic intervention in order to improve the following deficits and impairments:     Visit Diagnosis: No diagnosis found.     Problem List Patient Active Problem List   Diagnosis Date Noted  . OA (osteoarthritis) of knee 12/26/2015  . Essential hypertension 11/24/2015  . Hyperlipidemia 11/24/2015  . Rheumatoid arthritis (HCC) 11/24/2015  . Lumbar spondylosis 07/21/2014    Marry Guan, PT, MPT 01/26/2016, 10:25 AM  [facility] [address]  [address] [place], [place], [address] Phone: [phone]   Fax:  [phone]  Name: Joy [name] MRN: [id number] Date of Birth: 29-May-1941

## 2016-01-30 ENCOUNTER — Ambulatory Visit: Payer: Medicare Other

## 2016-01-30 DIAGNOSIS — R2689 Other abnormalities of gait and mobility: Secondary | ICD-10-CM

## 2016-01-30 DIAGNOSIS — M25662 Stiffness of left knee, not elsewhere classified: Secondary | ICD-10-CM

## 2016-01-30 DIAGNOSIS — R262 Difficulty in walking, not elsewhere classified: Secondary | ICD-10-CM

## 2016-01-30 DIAGNOSIS — M25562 Pain in left knee: Secondary | ICD-10-CM | POA: Diagnosis not present

## 2016-01-30 NOTE — Therapy (Signed)
[facility] [address]  [place], [place], [address] Phone: [phone]   Fax:  [phone]  Physical Therapy Treatment  Patient Details  Name: Joy [name] MRN: [id number] Date of Birth: 01/13/1941 Referring Provider: Dione Plover. Alus  Encounter Date: 01/30/2016      PT End of Session - 01/30/16 1455    Visit Number 9   Number of Visits 20  Reassess after 12 visits (3x/wk x 4 wks) per MD   Date for PT Re-Evaluation 02/06/16   Authorization Type MD approved initial POC for 3x/wk for 4 wks, then reassess   PT Start Time 1449   PT Stop Time 1545   PT Time Calculation (min) 56 min   Activity Tolerance Patient tolerated treatment well   Behavior During Therapy [facility] for tasks assessed/performed      Past Medical History  Diagnosis Date  . CTS (carpal tunnel syndrome)   . Hypertension   . Hypercholesteremia   . Osteoporosis   . Bronchitis   . Arthritis     RHEUMATOID  . RA (rheumatoid arthritis) ([place])   . Osteoarthritis   . Essential hypertension 11/24/2015  . Hyperlipidemia 11/24/2015  . Rheumatoid arthritis ([place]) 11/24/2015  . Dysrhythmia     "irregularity" unknown at this time - being evaluated by cardiology  . Seasonal allergies   . GERD (gastroesophageal reflux disease)   . OAB (overactive bladder)   . IBS (irritable bowel syndrome)     Past Surgical History  Procedure Laterality Date  . Back surgery    . Tonsillectomy    . Hernia repair      RIGHT ING.  . Breast surgery      REDUCTION  . Cataract extraction w/phaco  08/01/2012    Procedure: CATARACT EXTRACTION PHACO AND INTRAOCULAR LENS PLACEMENT (IOC);  Surgeon: Marylynn Pearson, MD;  Location: [facility];  Service: Ophthalmology;  Laterality: Right;  . Joint replacement  2014    rt total knee  . Abdominal hysterectomy  1995  . 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      Subjective Assessment - 01/30/16 1655    Subjective Pt. reports she has a 2/10 L knee pain today however that her whole body aches due to flare up of RA.  When asked if she had contacted her Rheumatologist, she replied that she has no appointment scheduled at this time.       Today's Treatment:   TherEx: NuStep: level 4, 4 min  L HS, PF, SKTC, glute stretch x 30 sec each L QS on bolster 5" hold x 10 reps   HS curl with heels on peanut ball x 15 reps   L SLR x 15 reps   vasoneumatic compression to L knee: L LE elevated, medium compression, coldest temp., 15 min   AROM assessment  Goal assessment  MMT assessment      PT Short Term Goals - 01/23/16 1716    PT SHORT TERM GOAL #1   Title Independent with initial HEP by 01/20/16      Status Achieved   PT SHORT TERM GOAL #2   Title Pt will demonstrate proper gait pattern with SPC by 01/20/16   Status Achieved           PT Long  Term Goals - 01/30/16 1525    PT LONG TERM GOAL #1   Title Independent with advanced HEP +/- gym program by 03/02/16   Status On-going   PT LONG TERM GOAL #2   Title Pt will ambulate with normal gait pattern w/o AD by 03/02/16   Status Partially Met  01/30/16: pt. able to ambulate with nearly symmetrical wt. shift however still with small step length and some L trendelenburg pattern.     PT LONG TERM GOAL #3   Title Pt will ascend/descend stairs reciprocally with normal step pattern by 03/02/16   Status On-going  01/30/16: pt. still requiring step-to pattern with rail and SPC use.     PT LONG TERM GOAL #4   Title L knee AROM 5-120 dg or greater by 03/02/16   Status Partially Met  01/30/16: L knee AROM 8-120 dg.    PT LONG TERM GOAL #5   Title L hip and knee strength 4+/5 or greater by 03/02/16   Status Partially Met  01/30/16: L hip flexion 4+/5 all other motions at L hip / knee 4/5.                  Plan - 01/30/16 1455    Clinical Impression Statement Pt. reports she has a 2/10 L knee pain today however that her whole body aches due to flare up of RA.  When asked if she had contacted her Rheumatologist, she replied that she has no appointment scheduled at this time.  Pt. tolerated all hip / knee strengthening activity today very well with focus on TKE strengthening activity.  Pt. goal status assessed today; pt. L knee AROM has improved at 8-122 dg; pt. L hip / knee strength has improved however still grossly 4/5 in all motions.  Pt. on 9/20 treatments today in POC.  Pt. to see MD regarding L knee status on 01/31/16.     PT Treatment/Interventions Patient/family education;Therapeutic exercise;Manual techniques;Passive range of motion;Scar mobilization;Therapeutic activities;Functional mobility training;Gait training;Stair training;Neuromuscular re-education;Balance training;Cryotherapy;Vasopneumatic Device;Electrical Stimulation;ADLs/Self Care Home Management   PT Next Visit Plan L knee ROM with emphasis on full active extension; Hip/knee strenghtening & proprioception training; Taping as indicated for pain/edema per pt. tolerance/benefit noted      Patient will benefit from skilled therapeutic intervention in order to improve the following deficits and impairments:  Pain, Decreased range of motion, Impaired flexibility, Decreased strength, Difficulty walking, Abnormal gait, Decreased balance, Decreased activity tolerance, Decreased scar mobility  Visit Diagnosis: Stiffness of left knee, not elsewhere classified  Pain in left knee  Difficulty in walking, not elsewhere classified  Other abnormalities of gait and mobility     Problem List Patient Active Problem List   Diagnosis Date Noted  . OA (osteoarthritis) of knee 12/26/2015  . Essential hypertension 11/24/2015  . Hyperlipidemia 11/24/2015  . Rheumatoid arthritis ([place]) 11/24/2015  . Lumbar spondylosis 07/21/2014    Bess Harvest, PTA 01/30/2016, 5:00 PM  [facility] [address]  [place], [place], [address] Phone: [phone]   Fax:  [phone]  Name: Joy [name] MRN: [id number] Date of Birth: July 17, 1941

## 2016-01-31 DIAGNOSIS — Z96652 Presence of left artificial knee joint: Secondary | ICD-10-CM | POA: Diagnosis not present

## 2016-01-31 DIAGNOSIS — Z471 Aftercare following joint replacement surgery: Secondary | ICD-10-CM | POA: Diagnosis not present

## 2016-02-01 ENCOUNTER — Ambulatory Visit: Payer: Medicare Other

## 2016-02-01 DIAGNOSIS — R262 Difficulty in walking, not elsewhere classified: Secondary | ICD-10-CM | POA: Diagnosis not present

## 2016-02-01 DIAGNOSIS — R2689 Other abnormalities of gait and mobility: Secondary | ICD-10-CM | POA: Diagnosis not present

## 2016-02-01 DIAGNOSIS — M25662 Stiffness of left knee, not elsewhere classified: Secondary | ICD-10-CM

## 2016-02-01 DIAGNOSIS — M25562 Pain in left knee: Secondary | ICD-10-CM | POA: Diagnosis not present

## 2016-02-01 NOTE — Therapy (Signed)
[facility] [address]  [place], [place], [address] Phone: [phone]   Fax:  [phone]  Physical Therapy Treatment  Patient Details  Name: Joy [name] MRN: [id number] Date of Birth: September 17, 1940 Referring Provider: Dione Plover. Aluisio, MD  Encounter Date: 02/01/2016      PT End of Session - 02/02/16 1026    Visit Number 11   Number of Visits 20  Reassess after 12 visits (3x/wk x 4 wks) per MD   Date for PT Re-Evaluation 02/06/16   Authorization Type MD approved initial POC for 3x/wk for 4 wks, then reassess   PT Start Time 1019   PT Stop Time 1111   PT Time Calculation (min) 52 min   Activity Tolerance Patient tolerated treatment well   Behavior During Therapy WFL for tasks assessed/performed      Past Medical History  Diagnosis Date  . CTS (carpal tunnel syndrome)   . Hypertension   . Hypercholesteremia   . Osteoporosis   . Bronchitis   . Arthritis     RHEUMATOID  . RA (rheumatoid arthritis) ([place])   . Osteoarthritis   . Essential hypertension 11/24/2015  . Hyperlipidemia 11/24/2015  . Rheumatoid arthritis ([place]) 11/24/2015  . Dysrhythmia     "irregularity" unknown at this time - being evaluated by cardiology  . Seasonal allergies   . GERD (gastroesophageal reflux disease)   . OAB (overactive bladder)   . IBS (irritable bowel syndrome)     Past Surgical History  Procedure Laterality Date  . Back surgery    . Tonsillectomy    . Hernia repair      RIGHT ING.  . Breast surgery      REDUCTION  . Cataract extraction w/phaco  08/01/2012    Procedure: CATARACT EXTRACTION PHACO AND INTRAOCULAR LENS PLACEMENT (IOC);  Surgeon: Marylynn Pearson, MD;  Location: [place];  Service: Ophthalmology;  Laterality: Right;  . Joint replacement  2014    rt total knee  . Abdominal hysterectomy  1995  . 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      Subjective Assessment - 02/02/16 1025    Subjective Pt feels that taping is continuing to help but still notes increased "awareness" of L knee with very mild discomfort (0.5/10). Pt states MD pleased with her progress and is ok with her continuing PT. Plans to start going to the gym soon.   Currently in Pain? Yes   Pain Score --  0.5/10   Pain Location Knee   Pain Orientation Left            [facility] PT Assessment - 02/02/16 1019    Assessment   Medical Diagnosis L TKA   Referring Provider Dione Plover. Aluisio, MD   Next MD Visit 03/06/16      Today's treatment:  Therex: L HS, SKTC stretch x 30 sec  Hooklying bridge x 15 reps  SL bridge with heels on peanut p-ball x 15 reps  Hooklying bridge with hip isometric / ER with black TB 3 x 5 reps  Hooklying bridge with alternating hip abd/ER with black TB x 5 reps each side B sidelying clam shell with black TB x 10 reps Seated fitter knee extension (1 blue / 1 black) x 15 reps 6" L  step up with black TB pulling knee into flexion x 15 reps TKE with black TB in door x 15 reps    Vasoneumatic device to L knee: L LE elevated, medium compression, coldest temp., 15 min        PT Short Term Goals - 01/23/16 1716    PT SHORT TERM GOAL #1   Title Independent with initial HEP by 01/20/16      Status Achieved   PT SHORT TERM GOAL #2   Title Pt will demonstrate proper gait pattern with SPC by 01/20/16   Status Achieved           PT Long Term Goals - 2016-02-21 1424    PT LONG TERM GOAL #1   Title Independent with advanced HEP +/- gym program by 03/02/16   Status On-going   PT LONG TERM GOAL #2   Title Pt will ambulate with normal gait pattern w/o AD by 03/02/16   Status Partially Met  01/30/16: pt. able to ambulate with nearly symmetrical wt. shift however still with small step length and some L trendelenburg pattern.     PT LONG TERM  GOAL #3   Title Pt will ascend/descend stairs reciprocally with normal step pattern by 03/02/16   Status On-going  01/30/16: pt. still requiring step-to pattern with rail and SPC use.     PT LONG TERM GOAL #4   Title L knee AROM 5-120 dg or greater by 03/02/16   Status Partially Met  02-21-16: L knee AROM 6-126 dg.    PT LONG TERM GOAL #5   Title L hip and knee strength 4+/5 or greater by 03/02/16   Status Partially Met  01/30/16: L hip flexion 4+/5 all other motions at L hip / knee 4/5.                 Plan - 2016/02/21 1423    Clinical Impression Statement Pt. pain free in L knee initially today; pt. reports her f/u with MD and MD was pleased with pt. progress with PT thus far; pt. reports she was cleared to drive.  Pt. tolerated all supine and standing hip / knee strengthening activity well with focus on L TKE strengthening activity today.  pt. reports benefit from L knee taping; taping reapplied in today's treatment.  Pt. to see PT tomorrow for assessment of goal status.     PT Treatment/Interventions Patient/family education;Therapeutic exercise;Manual techniques;Passive range of motion;Scar mobilization;Therapeutic activities;Functional mobility training;Gait training;Stair training;Neuromuscular re-education;Balance training;Cryotherapy;Vasopneumatic Device;Electrical Stimulation;ADLs/Self Care Home Management   PT Next Visit Plan L knee ROM with emphasis on full active extension; Hip/knee strenghtening & proprioception training; Taping as indicated for pain/edema per pt. tolerance/benefit noted      Patient will benefit from skilled therapeutic intervention in order to improve the following deficits and impairments:  Pain, Decreased range of motion, Impaired flexibility, Decreased strength, Difficulty walking, Abnormal gait, Decreased balance, Decreased activity tolerance, Decreased scar mobility  Visit Diagnosis: Stiffness of left knee, not elsewhere classified  Pain in left  knee  Difficulty in walking, not elsewhere classified  Other abnormalities of gait and mobility       G-Codes - 02/21/2016 1445    Functional Assessment Tool Used Knee FOTO - 55% (45% limitation)   Functional Limitation Mobility: Walking and moving around   Mobility: Walking and Moving Around Current Status (R4431) At least 40 percent but less than 60 percent impaired, limited or restricted   Mobility: Walking and Moving Around Goal Status (V4008) At  least 40 percent but less than 60 percent impaired, limited or restricted      Problem List Patient Active Problem List   Diagnosis Date Noted  . OA (osteoarthritis) of knee 12/26/2015  . Essential hypertension 11/24/2015  . Hyperlipidemia 11/24/2015  . Rheumatoid arthritis ([place]) 11/24/2015  . Lumbar spondylosis 07/21/2014    Bess Harvest, PTA 02/02/2016, 1:12 PM  [facility] [address]  [place], [place], [address] Phone: [phone]   Fax:  [phone]  Name: HALLI EQUIHUA MRN: [id number] Date of Birth: [birth year]    Percival Spanish, PT, MPT 02/02/2016, 1:12 PM  [facility] [address]  [place], [place], [address] Phone: [phone]   Fax:  [phone]

## 2016-02-02 ENCOUNTER — Ambulatory Visit: Payer: Medicare Other | Admitting: Physical Therapy

## 2016-02-02 DIAGNOSIS — M25662 Stiffness of left knee, not elsewhere classified: Secondary | ICD-10-CM

## 2016-02-02 DIAGNOSIS — R2689 Other abnormalities of gait and mobility: Secondary | ICD-10-CM | POA: Diagnosis not present

## 2016-02-02 DIAGNOSIS — R262 Difficulty in walking, not elsewhere classified: Secondary | ICD-10-CM | POA: Diagnosis not present

## 2016-02-02 DIAGNOSIS — M25562 Pain in left knee: Secondary | ICD-10-CM | POA: Diagnosis not present

## 2016-02-02 NOTE — Therapy (Signed)
[facility] [address]  [place], [place], [address] Phone: [phone]   Fax:  [phone]  Physical Therapy Treatment  Patient Details  Name: Joy [name] MRN: [id number] Date of Birth: 05-Jun-1941 Referring Provider: Dione Plover. Aluisio, MD  Encounter Date: 02/02/2016      PT End of Session - 02/02/16 1026    Visit Number 11   Number of Visits 20  Reassess after 12 visits (3x/wk x 4 wks) per MD   Date for PT Re-Evaluation 02/06/16   Authorization Type MD approved initial POC for 3x/wk for 4 wks, then reassess   PT Start Time 1019   PT Stop Time 1111   PT Time Calculation (min) 52 min   Activity Tolerance Patient tolerated treatment well   Behavior During Therapy WFL for tasks assessed/performed      Past Medical History  Diagnosis Date  . CTS (carpal tunnel syndrome)   . Hypertension   . Hypercholesteremia   . Osteoporosis   . Bronchitis   . Arthritis     RHEUMATOID  . RA (rheumatoid arthritis) ([place])   . Osteoarthritis   . Essential hypertension 11/24/2015  . Hyperlipidemia 11/24/2015  . Rheumatoid arthritis ([place]) 11/24/2015  . Dysrhythmia     "irregularity" unknown at this time - being evaluated by cardiology  . Seasonal allergies   . GERD (gastroesophageal reflux disease)   . OAB (overactive bladder)   . IBS (irritable bowel syndrome)     Past Surgical History  Procedure Laterality Date  . Back surgery    . Tonsillectomy    . Hernia repair      RIGHT ING.  . Breast surgery      REDUCTION  . Cataract extraction w/phaco  08/01/2012    Procedure: CATARACT EXTRACTION PHACO AND INTRAOCULAR LENS PLACEMENT (IOC);  Surgeon: Marylynn Pearson, MD;  Location: [place];  Service: Ophthalmology;  Laterality: Right;  . Joint replacement  2014    rt total knee  . Abdominal hysterectomy  1995  . 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      Subjective Assessment - 02/02/16 1025    Subjective Pt feels that taping is continuing to help but still notes increased "awareness" of L knee with very mild discomfort (0.5/10). Pt states MD pleased with her progress and is ok with her continuing PT per the therapist. Plans to start going to the gym soon.   Currently in Pain? Yes   Pain Score --  0.5/10   Pain Location Knee   Pain Orientation Left            [facility] PT Assessment - 02/02/16 1019    Assessment   Medical Diagnosis L TKA   Referring Provider Dione Plover. Aluisio, MD   Next MD Visit 03/06/16          Today's Treatment  TherEx Rec Bike - lvl 2 x 5' TRX squat x10 L Heelcord stretch with Prostretch 2x30" BATCA Knee Flexion 25# x20 B concentric/eccentric; 20# x10 B concentric / L eccentric  BATCA Knee Extension 15# x10, 20# x10 B concentric/eccentric  6" L Fwd Step-up x15 6" L Lat Step-up x15 BATCA Leg Press 25# x10 Standing 4 way hip (flexion, adduction, extension, abduction) with red TB x10 each  Vasopneumatic  compression to L knee - LE elevated on rolled yoga mats, medium compression, lowest temp x10'           PT Short Term Goals - 01/23/16 1716    PT SHORT TERM GOAL #1   Title Independent with initial HEP by 01/20/16      Status Achieved   PT SHORT TERM GOAL #2   Title Pt will demonstrate proper gait pattern with SPC by 01/20/16   Status Achieved           PT Long Term Goals - 2016-02-21 1424    PT LONG TERM GOAL #1   Title Independent with advanced HEP +/- gym program by 03/02/16   Status On-going   PT LONG TERM GOAL #2   Title Pt will ambulate with normal gait pattern w/o AD by 03/02/16   Status Partially Met  01/30/16: pt. able to ambulate with nearly symmetrical wt. shift however still with small step length and some L trendelenburg pattern.     PT LONG TERM GOAL #3   Title Pt will  ascend/descend stairs reciprocally with normal step pattern by 03/02/16   Status On-going  01/30/16: pt. still requiring step-to pattern with rail and SPC use.     PT LONG TERM GOAL #4   Title L knee AROM 5-120 dg or greater by 03/02/16   Status Partially Met  February 21, 2016: L knee AROM 6-126 dg.    PT LONG TERM GOAL #5   Title L hip and knee strength 4+/5 or greater by 03/02/16   Status Partially Met  01/30/16: L hip flexion 4+/5 all other motions at L hip / knee 4/5.                 Plan - 02/02/16 1111    Clinical Impression Statement Pt reports taping has continued to help with the pain/swelling at her L knee with current pain barely noticeable (0.5/10). Pt continues to demonstrate functional weakness on L with pt still noting limited with stair negotiation and pt planning to return to gym workout soon, therefore therapy sesion focused on increasing confidence with leading with L LE on steps as well as training in use of weight machines in prep for transition to gym. Will plan to reduce frequency to 2x/wk x1-2 weeks to facilitate transition to HEP/gym program.   PT Frequency 2x / week   PT Treatment/Interventions Patient/family education;Therapeutic exercise;Manual techniques;Passive range of motion;Scar mobilization;Therapeutic activities;Functional mobility training;Gait training;Stair training;Neuromuscular re-education;Balance training;Cryotherapy;Vasopneumatic Device;Electrical Stimulation;ADLs/Self Care Home Management   PT Next Visit Plan L knee ROM with emphasis on full active extension; Hip/knee strenghtening & proprioception training; Taping as indicated for pain/edema per pt. tolerance/benefit noted   Consulted and Agree with Plan of Care Patient      Patient will benefit from skilled therapeutic intervention in order to improve the following deficits and impairments:  Pain, Decreased range of motion, Impaired flexibility, Decreased strength, Difficulty walking, Abnormal gait,  Decreased balance, Decreased activity tolerance, Decreased scar mobility  Visit Diagnosis: Stiffness of left knee, not elsewhere classified  Pain in left knee  Difficulty in walking, not elsewhere classified  Other abnormalities of gait and mobility       G-Codes - 02/21/2016 1445    Functional Assessment Tool Used Knee FOTO - 55% (45% limitation)   Functional Limitation Mobility: Walking and moving around   Mobility: Walking and Moving Around Current Status (F0263) At least 40 percent but less than 60 percent impaired, limited or restricted   Mobility:  Walking and Moving Around Goal Status [phone]) At least 40 percent but less than 60 percent impaired, limited or restricted      Problem List Patient Active Problem List   Diagnosis Date Noted  . OA (osteoarthritis) of knee 12/26/2015  . Essential hypertension 11/24/2015  . Hyperlipidemia 11/24/2015  . Rheumatoid arthritis ([place]) 11/24/2015  . Lumbar spondylosis 07/21/2014    Percival Spanish, PT, MPT 02/02/2016, 1:57 PM  [facility] [address]  [address] [place], [place], [address] Phone: [phone]   Fax:  [phone]  Name: Joy [name] MRN: [id number] Date of Birth: 20-Mar-1941

## 2016-02-06 ENCOUNTER — Ambulatory Visit: Payer: Medicare Other | Admitting: Physical Therapy

## 2016-02-06 DIAGNOSIS — R2689 Other abnormalities of gait and mobility: Secondary | ICD-10-CM

## 2016-02-06 DIAGNOSIS — R262 Difficulty in walking, not elsewhere classified: Secondary | ICD-10-CM | POA: Diagnosis not present

## 2016-02-06 DIAGNOSIS — M25662 Stiffness of left knee, not elsewhere classified: Secondary | ICD-10-CM

## 2016-02-06 DIAGNOSIS — M25562 Pain in left knee: Secondary | ICD-10-CM

## 2016-02-06 NOTE — Therapy (Signed)
[facility] [address]  [place], [place], [address] Phone: [phone]   Fax:  [phone]  Physical Therapy Treatment  Patient Details  Name: Joy [name] MRN: [id number] Date of Birth: [birth year] Referring Provider: Dione Plover. Aluisio, MD  Encounter Date: 02/06/2016      PT End of Session - 02/06/16 1401    Visit Number 12   Number of Visits 20  Reassess after 12 visits (3x/wk x 4 wks) per MD   Date for PT Re-Evaluation 02/06/16   Authorization Type MD approved initial POC for 3x/wk for 4 wks, then reassess   PT Start Time 1355   PT Stop Time 1500   PT Time Calculation (min) 65 min   Activity Tolerance Patient tolerated treatment well   Behavior During Therapy [facility] for tasks assessed/performed      Past Medical History  Diagnosis Date  . CTS (carpal tunnel syndrome)   . Hypertension   . Hypercholesteremia   . Osteoporosis   . Bronchitis   . Arthritis     RHEUMATOID  . RA (rheumatoid arthritis) ([place])   . Osteoarthritis   . Essential hypertension 11/24/2015  . Hyperlipidemia 11/24/2015  . Rheumatoid arthritis ([place]) 11/24/2015  . Dysrhythmia     "irregularity" unknown at this time - being evaluated by cardiology  . Seasonal allergies   . GERD (gastroesophageal reflux disease)   . OAB (overactive bladder)   . IBS (irritable bowel syndrome)     Past Surgical History  Procedure Laterality Date  . Back surgery    . Tonsillectomy    . Hernia repair      RIGHT ING.  . Breast surgery      REDUCTION  . Cataract extraction w/phaco  08/01/2012    Procedure: CATARACT EXTRACTION PHACO AND INTRAOCULAR LENS PLACEMENT (IOC);  Surgeon: Marylynn Pearson, MD;  Location: [place];  Service: Ophthalmology;  Laterality: Right;  . Joint replacement  2014    rt total knee  . Abdominal hysterectomy  1995  . 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      Subjective Assessment - 02/06/16 1358    Subjective Pt reports increased muscle soreness on Sat/Sun after last therapy session, but states it has resolved today and knee feels really good.   Currently in Pain? No/denies          Today's Treatment  TherEx Rec Bike - lvl 2 x 5' BATCA Leg Press 25# x10 Standing 4 way hip (flexion, adduction, extension, abduction) with red TB x10 each TRX squat x10 BATCA Knee Flexion 25# x10 B concentric/eccentric; 20# x10 B concentric / L eccentric  BATCA Knee Extension 20# x10 B concentric/eccentric; 15# x10 B concentric / L eccentric  L Heelcord stretch with Prostretch 2x30" 4" L Lat Step-down (eccentric lowering) x10 8" L Fwd Step-up x10  Gait  Stair training for reciprocal approach - Pt able to demonstrate symmetrical pattern on ascent, but lacks L quad eccentric control on descent  Kinesiotape - 3 "I" strips   2 strips (30-50%) medial & lateral to patella crossing distal to tibial tuberosity  1 strip horizontally (50%) over inferior pole of patella to medial/lateral knee  Vasopneumatic compression to L knee - LE elevated on rolled yoga mats, medium compression, lowest temp x10'  PT Education - 02/06/16 1458    Education provided Yes   Education Details Standing 4 way hip progressed to red TB, Step-up & Eccentric step-down added to HEP   Person(s) Educated Patient   Methods Explanation;Demonstration;Handout   Comprehension Verbalized understanding;Returned demonstration          PT Short Term Goals - 01/23/16 1716    PT SHORT TERM GOAL #1   Title Independent with initial HEP by 01/20/16      Status Achieved   PT SHORT TERM GOAL #2   Title Pt will demonstrate proper gait pattern with SPC by 01/20/16   Status Achieved           PT Long Term Goals - 02/01/16 1424    PT LONG TERM GOAL #1   Title Independent  with advanced HEP +/- gym program by 03/02/16   Status On-going   PT LONG TERM GOAL #2   Title Pt will ambulate with normal gait pattern w/o AD by 03/02/16   Status Partially Met  01/30/16: pt. able to ambulate with nearly symmetrical wt. shift however still with small step length and some L trendelenburg pattern.     PT LONG TERM GOAL #3   Title Pt will ascend/descend stairs reciprocally with normal step pattern by 03/02/16   Status On-going  01/30/16: pt. still requiring step-to pattern with rail and SPC use.     PT LONG TERM GOAL #4   Title L knee AROM 5-120 dg or greater by 03/02/16   Status Partially Met  02/01/16: L knee AROM 6-126 dg.    PT LONG TERM GOAL #5   Title L hip and knee strength 4+/5 or greater by 03/02/16   Status Partially Met  01/30/16: L hip flexion 4+/5 all other motions at L hip / knee 4/5.                 Plan - 02/06/16 1504    Clinical Impression Statement Pt noting increased muscel soreness after last therapy session peaking on Sat & Sun, but states knee much better today with no pain at all. Pt able to demonstrate reciprocal ascent on stairs with symmetrical pattern, but lacks L eccentric quad control on descent and demonstrates a preference for lateral approach. HEP updated today and will reassess status at next visit to determine readiness for transition to HEP vs need for continued PT.   PT Frequency 2x / week   PT Treatment/Interventions Patient/family education;Therapeutic exercise;Manual techniques;Passive range of motion;Scar mobilization;Therapeutic activities;Functional mobility training;Gait training;Stair training;Neuromuscular re-education;Balance training;Cryotherapy;Vasopneumatic Device;Electrical Stimulation;ADLs/Self Care Home Management   PT Next Visit Plan Determine readiness for transition to HEP vs need for continued PT for L knee ROM with emphasis on full active extension; Hip/knee strenghtening & proprioception training; Taping as indicated  for pain/edema per pt. tolerance/benefit noted   Consulted and Agree with Plan of Care Patient      Patient will benefit from skilled therapeutic intervention in order to improve the following deficits and impairments:  Pain, Decreased range of motion, Impaired flexibility, Decreased strength, Difficulty walking, Abnormal gait, Decreased balance, Decreased activity tolerance, Decreased scar mobility  Visit Diagnosis: Stiffness of left knee, not elsewhere classified  Pain in left knee  Difficulty in walking, not elsewhere classified  Other abnormalities of gait and mobility     Problem List Patient Active Problem List   Diagnosis Date Noted  . OA (osteoarthritis) of knee 12/26/2015  . Essential hypertension 11/24/2015  . Hyperlipidemia 11/24/2015  . Rheumatoid  arthritis ([place]) 11/24/2015  . Lumbar spondylosis 07/21/2014    Percival Spanish, PT, MPT 02/06/2016, 3:21 PM  [facility] [address]  [place], [place], [address] Phone: [phone]   Fax:  [phone]  Name: Joy [name] MRN: [id number] Date of Birth: March 15, 1941

## 2016-02-08 DIAGNOSIS — M0589 Other rheumatoid arthritis with rheumatoid factor of multiple sites: Secondary | ICD-10-CM | POA: Diagnosis not present

## 2016-02-08 DIAGNOSIS — Z79899 Other long term (current) drug therapy: Secondary | ICD-10-CM | POA: Diagnosis not present

## 2016-02-09 ENCOUNTER — Ambulatory Visit: Payer: Medicare Other | Admitting: Physical Therapy

## 2016-02-09 DIAGNOSIS — R2689 Other abnormalities of gait and mobility: Secondary | ICD-10-CM

## 2016-02-09 DIAGNOSIS — R262 Difficulty in walking, not elsewhere classified: Secondary | ICD-10-CM

## 2016-02-09 DIAGNOSIS — M25662 Stiffness of left knee, not elsewhere classified: Secondary | ICD-10-CM | POA: Diagnosis not present

## 2016-02-09 DIAGNOSIS — M25562 Pain in left knee: Secondary | ICD-10-CM

## 2016-02-09 NOTE — Therapy (Signed)
[facility] [address]  [place], [place], [address] Phone: [phone]   Fax:  [phone]  Physical Therapy Treatment  Patient Details  Name: Joy [name] MRN: [id number] Date of Birth: 1941-03-04 Referring Provider: Dione Plover. Aluisio, MD  Encounter Date: 02/09/2016      PT End of Session - 02/09/16 0800    Visit Number 13   Number of Visits 20   Date for PT Re-Evaluation --   Authorization Type --   PT Start Time 0800   PT Stop Time 0841   PT Time Calculation (min) 41 min   Activity Tolerance Patient tolerated treatment well   Behavior During Therapy [facility] for tasks assessed/performed      Past Medical History  Diagnosis Date  . CTS (carpal tunnel syndrome)   . Hypertension   . Hypercholesteremia   . Osteoporosis   . Bronchitis   . Arthritis     RHEUMATOID  . RA (rheumatoid arthritis) ([place])   . Osteoarthritis   . Essential hypertension 11/24/2015  . Hyperlipidemia 11/24/2015  . Rheumatoid arthritis ([place]) 11/24/2015  . Dysrhythmia     "irregularity" unknown at this time - being evaluated by cardiology  . Seasonal allergies   . GERD (gastroesophageal reflux disease)   . OAB (overactive bladder)   . IBS (irritable bowel syndrome)     Past Surgical History  Procedure Laterality Date  . Back surgery    . Tonsillectomy    . Hernia repair      RIGHT ING.  . Breast surgery      REDUCTION  . Cataract extraction w/phaco  08/01/2012    Procedure: CATARACT EXTRACTION PHACO AND INTRAOCULAR LENS PLACEMENT (IOC);  Surgeon: Marylynn Pearson, MD;  Location: [place];  Service: Ophthalmology;  Laterality: Right;  . Joint replacement  2014    rt total knee  . Abdominal hysterectomy  1995  . Total knee arthroplasty Left 12/26/2015    Procedure: TOTAL KNEE ARTHROPLASTY;  Surgeon: Gaynelle Arabian, MD;  Location: [facility];  Service: Orthopedics;  Laterality: Left;    There were no vitals filed for this visit.       Subjective Assessment - 02/09/16 0804    Subjective Pt denies knee pain today but notes heel pain worst upon rising in the morning. Has weaned off her cane and feels confident with walking and stairs. Thinks she ready to transition to HEP/gym program.   Currently in Pain? No/denies            [facility] PT Assessment - 02/09/16 0800    Assessment   Medical Diagnosis L TKA   Referring Provider Dione Plover. Aluisio, MD   Next MD Visit 03/06/16   Observation/Other Assessments   Focus on Therapeutic Outcomes (FOTO)  Knee - 55% (45% limitation)   ROM / Strength   AROM / PROM / Strength AROM;PROM;Strength   AROM   AROM Assessment Site Knee   Right/Left Knee Left   Left Knee Extension 4   Left Knee Flexion 127   PROM   PROM Assessment Site Knee   Right/Left Knee Left   Left Knee Extension 2   Left Knee Flexion 130   Strength   Strength Assessment Site Hip;Knee   Right Hip Flexion 5/5   Right Hip Extension 4+/5   Right Hip ABduction 5/5   Right Hip ADduction 4+/5   Left Hip Flexion 4+/5   Left Hip Extension 4/5   Left Hip ABduction 4+/5  Left Hip ADduction 4+/5   Right Knee Flexion 5/5   Right Knee Extension 5/5   Left Knee Flexion 4+/5   Left Knee Extension 4+/5          Today's Treatment  TherEx Rec Bike - lvl 2 x 5'  ROM/MMT  Gait  Gait w/o AD demonstrating normal LE biomechanics but slight forward flexion of trunk due to h/o spinal stenosis Stair training with 1 rail assist with normal symmetrical reciprocal pattern on both ascent/descent  Goal Assessment  TherEx Seated self plantar fascia stretch x30" STM/cross friction massage to R plantar fascia Verbal instruction in use of iced water bottle rolling for plantar fascia  Vasopneumatic compression to L knee - LE elevated on rolled yoga mats, medium compression, lowest temp x10'         PT Education - 02/09/16 0835    Education provided Yes   Education Details Plantar fascia stretches   Person(s)  Educated Patient   Methods Explanation;Demonstration;Handout   Comprehension Verbalized understanding;Returned demonstration          PT Short Term Goals - 01/23/16 1716    PT SHORT TERM GOAL #1   Title Independent with initial HEP by 01/20/16      Status Achieved   PT SHORT TERM GOAL #2   Title Pt will demonstrate proper gait pattern with SPC by 01/20/16   Status Achieved           PT Long Term Goals - 02/09/16 0819    PT LONG TERM GOAL #1   Title Independent with advanced HEP +/- gym program by 03/02/16   Status Achieved   PT LONG TERM GOAL #2   Title Pt will ambulate with normal gait pattern w/o AD by 03/02/16   Status Achieved   PT LONG TERM GOAL #3   Title Pt will ascend/descend stairs reciprocally with normal step pattern by 03/02/16   Status Achieved   PT LONG TERM GOAL #4   Title L knee AROM 5-120 dg or greater by 03/02/16   Status Achieved   PT LONG TERM GOAL #5   Title L hip and knee strength 4+/5 or greater by 03/02/16   Status Achieved               Plan - 02/09/16 0836    Clinical Impression Statement Pt reports 95% improvement since starting PT, having been able to wean off AD with good gait pattern other than slight foward trunk flexion due to h/o spinal stenosis, and able to ascend/descend strairs reciprocally with sinlge rail support. L knee AROM 4-127 and PROM 2-130 with L LE strength at least 4+/5 or greater. No pain reported other than when pt attempted to passively pull knee further into flexion, which caused brief irritation in front of knee, therefore vasopnuematic icing applied to knee at end of session. Pt newly noting pain in R heel upon rising in the morning (none presently) which appears to be consistent with plantar fasciitis, therefore provided instruction in stretches, self-massage and rolling over iced water bottle to relieve irritation in the plantar fascia. Pt pleased with progress and all goals met for this episode, therefore will proceed  with discharge from PT. Pt verbalizes intent to continue with HEP and planning to continue wokring out at gym, including resuming yoga classes.   PT Treatment/Interventions Patient/family education;Therapeutic exercise;Manual techniques;Passive range of motion;Scar mobilization;Therapeutic activities;Functional mobility training;Gait training;Stair training;Neuromuscular re-education;Balance training;Cryotherapy;Vasopneumatic Device;Electrical Stimulation;ADLs/Self Care Home Management   PT Next Visit Plan Discharge  Consulted and Agree with Plan of Care Patient      Patient will benefit from skilled therapeutic intervention in order to improve the following deficits and impairments:  Pain, Decreased range of motion, Impaired flexibility, Decreased strength, Difficulty walking, Abnormal gait, Decreased balance, Decreased activity tolerance, Decreased scar mobility  Visit Diagnosis: Stiffness of left knee, not elsewhere classified  Pain in left knee  Difficulty in walking, not elsewhere classified  Other abnormalities of gait and mobility       G-Codes - 03-03-2016 0914    Functional Assessment Tool Used Knee FOTO - 55% (45% limitation)   Functional Limitation Mobility: Walking and moving around   Mobility: Walking and Moving Around Goal Status [phone]) At least 40 percent but less than 60 percent impaired, limited or restricted   Mobility: Walking and Moving Around Discharge Status [phone]) At least 40 percent but less than 60 percent impaired, limited or restricted      Problem List Patient Active Problem List   Diagnosis Date Noted  . OA (osteoarthritis) of knee 12/26/2015  . Essential hypertension 11/24/2015  . Hyperlipidemia 11/24/2015  . Rheumatoid arthritis ([place]) 11/24/2015  . Lumbar spondylosis 07/21/2014    Percival Spanish, PT, MPT 2016-03-03, 9:25 AM  [facility] [address]  [place], [place],  [address] Phone: [phone]   Fax:  [phone]  Name: PAMMY VESEY MRN: [id number] Date of Birth: [birth year]   PHYSICAL THERAPY DISCHARGE SUMMARY  Visits from Start of Care: 13  Current functional level related to goals / functional outcomes:   Refer to above clinical impression   Remaining deficits:   R heel pain consistent with plantar fasciitis    Education / Equipment:   HEP & Gym program  Plan: Patient agrees to discharge.  Patient goals were met. Patient is being discharged due to meeting the stated rehab goals.  ?????       Percival Spanish, PT, MPT March 03, 2016, 9:25 AM  [facility] [address]  [place], [place], [address] Phone: [phone]   Fax:  [phone]

## 2016-03-06 DIAGNOSIS — Z96652 Presence of left artificial knee joint: Secondary | ICD-10-CM | POA: Diagnosis not present

## 2016-03-06 DIAGNOSIS — Z471 Aftercare following joint replacement surgery: Secondary | ICD-10-CM | POA: Diagnosis not present

## 2016-04-04 DIAGNOSIS — Z79899 Other long term (current) drug therapy: Secondary | ICD-10-CM | POA: Diagnosis not present

## 2016-04-04 DIAGNOSIS — M0589 Other rheumatoid arthritis with rheumatoid factor of multiple sites: Secondary | ICD-10-CM | POA: Diagnosis not present

## 2016-04-10 DIAGNOSIS — M4806 Spinal stenosis, lumbar region: Secondary | ICD-10-CM | POA: Diagnosis not present

## 2016-04-12 DIAGNOSIS — Z79899 Other long term (current) drug therapy: Secondary | ICD-10-CM | POA: Diagnosis not present

## 2016-04-12 DIAGNOSIS — M2550 Pain in unspecified joint: Secondary | ICD-10-CM | POA: Diagnosis not present

## 2016-04-12 DIAGNOSIS — M150 Primary generalized (osteo)arthritis: Secondary | ICD-10-CM | POA: Diagnosis not present

## 2016-04-12 DIAGNOSIS — M542 Cervicalgia: Secondary | ICD-10-CM | POA: Diagnosis not present

## 2016-04-12 DIAGNOSIS — M0579 Rheumatoid arthritis with rheumatoid factor of multiple sites without organ or systems involvement: Secondary | ICD-10-CM | POA: Diagnosis not present

## 2016-04-12 DIAGNOSIS — M8000XS Age-related osteoporosis with current pathological fracture, unspecified site, sequela: Secondary | ICD-10-CM | POA: Diagnosis not present

## 2016-04-12 DIAGNOSIS — S82142A Displaced bicondylar fracture of left tibia, initial encounter for closed fracture: Secondary | ICD-10-CM | POA: Diagnosis not present

## 2016-04-12 DIAGNOSIS — Z1589 Genetic susceptibility to other disease: Secondary | ICD-10-CM | POA: Diagnosis not present

## 2016-04-14 IMAGING — NM NM MISC PROCEDURE
6 series · 36 of 36 positions shown · non-contrast
Comparison: none

[Series 1: wbr_r-proj_st wbr rest · 6.40mm/px · 6 of 64 frames shown]
[frame 6/64]
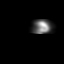
[frame 16/64]
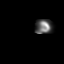
[frame 27/64]
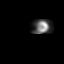
[frame 38/64]
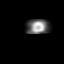
[frame 48/64]
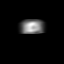
[frame 59/64]
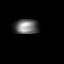

[Series 1: wbr rest · 6.40mm/px · 6 of 64 frames shown]
[frame 6/64]
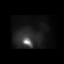
[frame 16/64]
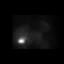
[frame 27/64]
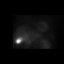
[frame 38/64]
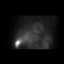
[frame 48/64]
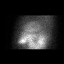
[frame 59/64]
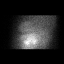

[Series 2: wbr stress-gsp · 6.40mm/px · 6 of 512 frames shown]
[frame 43/512]
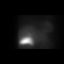
[frame 128/512]
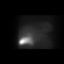
[frame 214/512]
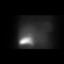
[frame 299/512]
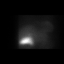
[frame 384/512]
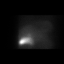
[frame 470/512]
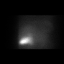

[Series 2: wbr_s-proj_st wbr stress-gsp · 6.40mm/px · 6 of 512 frames shown]
[frame 43/512]
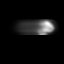
[frame 128/512]
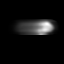
[frame 214/512]
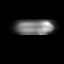
[frame 299/512]
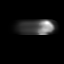
[frame 384/512]
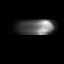
[frame 470/512]
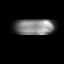

[Series 3: wbr stress-sum-em · 6.40mm/px · 6 of 64 frames shown]
[frame 6/64]
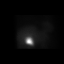
[frame 16/64]
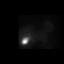
[frame 27/64]
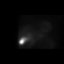
[frame 38/64]
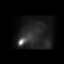
[frame 48/64]
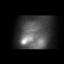
[frame 59/64]
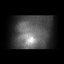

[Series 3: wbr_s-proj_st wbr stress-sum-em · 6.40mm/px · 6 of 64 frames shown]
[frame 6/64]
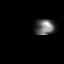
[frame 16/64]
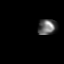
[frame 27/64]
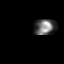
[frame 38/64]
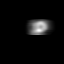
[frame 48/64]
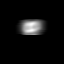
[frame 59/64]
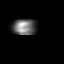

[36 of 36 positions shown; findings below may reference images not displayed]

Canned report from images found in remote index.

Refer to host system for actual result text.

## 2016-05-14 DIAGNOSIS — H16223 Keratoconjunctivitis sicca, not specified as Sjogren's, bilateral: Secondary | ICD-10-CM | POA: Diagnosis not present

## 2016-05-14 DIAGNOSIS — H2512 Age-related nuclear cataract, left eye: Secondary | ICD-10-CM | POA: Diagnosis not present

## 2016-05-14 DIAGNOSIS — H43813 Vitreous degeneration, bilateral: Secondary | ICD-10-CM | POA: Diagnosis not present

## 2016-05-30 DIAGNOSIS — Z79899 Other long term (current) drug therapy: Secondary | ICD-10-CM | POA: Diagnosis not present

## 2016-05-30 DIAGNOSIS — M0589 Other rheumatoid arthritis with rheumatoid factor of multiple sites: Secondary | ICD-10-CM | POA: Diagnosis not present

## 2016-06-18 DIAGNOSIS — Z6836 Body mass index (BMI) 36.0-36.9, adult: Secondary | ICD-10-CM | POA: Diagnosis not present

## 2016-06-18 DIAGNOSIS — M48062 Spinal stenosis, lumbar region with neurogenic claudication: Secondary | ICD-10-CM | POA: Diagnosis not present

## 2016-06-20 DIAGNOSIS — I10 Essential (primary) hypertension: Secondary | ICD-10-CM | POA: Diagnosis not present

## 2016-06-22 DIAGNOSIS — M48062 Spinal stenosis, lumbar region with neurogenic claudication: Secondary | ICD-10-CM | POA: Diagnosis not present

## 2016-06-22 DIAGNOSIS — M5126 Other intervertebral disc displacement, lumbar region: Secondary | ICD-10-CM | POA: Diagnosis not present

## 2016-06-25 DIAGNOSIS — I10 Essential (primary) hypertension: Secondary | ICD-10-CM | POA: Diagnosis not present

## 2016-06-25 DIAGNOSIS — Z6836 Body mass index (BMI) 36.0-36.9, adult: Secondary | ICD-10-CM | POA: Diagnosis not present

## 2016-06-25 DIAGNOSIS — M48062 Spinal stenosis, lumbar region with neurogenic claudication: Secondary | ICD-10-CM | POA: Diagnosis not present

## 2016-06-26 DIAGNOSIS — Q782 Osteopetrosis: Secondary | ICD-10-CM | POA: Diagnosis not present

## 2016-06-26 DIAGNOSIS — M810 Age-related osteoporosis without current pathological fracture: Secondary | ICD-10-CM | POA: Diagnosis not present

## 2016-06-26 DIAGNOSIS — M85832 Other specified disorders of bone density and structure, left forearm: Secondary | ICD-10-CM | POA: Diagnosis not present

## 2016-07-17 DIAGNOSIS — Z79899 Other long term (current) drug therapy: Secondary | ICD-10-CM | POA: Diagnosis not present

## 2016-07-17 DIAGNOSIS — M0579 Rheumatoid arthritis with rheumatoid factor of multiple sites without organ or systems involvement: Secondary | ICD-10-CM | POA: Diagnosis not present

## 2016-07-17 DIAGNOSIS — M542 Cervicalgia: Secondary | ICD-10-CM | POA: Diagnosis not present

## 2016-07-17 DIAGNOSIS — Z1589 Genetic susceptibility to other disease: Secondary | ICD-10-CM | POA: Diagnosis not present

## 2016-07-17 DIAGNOSIS — Z6836 Body mass index (BMI) 36.0-36.9, adult: Secondary | ICD-10-CM | POA: Diagnosis not present

## 2016-07-17 DIAGNOSIS — M2550 Pain in unspecified joint: Secondary | ICD-10-CM | POA: Diagnosis not present

## 2016-07-17 DIAGNOSIS — E669 Obesity, unspecified: Secondary | ICD-10-CM | POA: Diagnosis not present

## 2016-07-17 DIAGNOSIS — M8000XS Age-related osteoporosis with current pathological fracture, unspecified site, sequela: Secondary | ICD-10-CM | POA: Diagnosis not present

## 2016-07-17 DIAGNOSIS — M150 Primary generalized (osteo)arthritis: Secondary | ICD-10-CM | POA: Diagnosis not present

## 2016-07-18 DIAGNOSIS — Z6836 Body mass index (BMI) 36.0-36.9, adult: Secondary | ICD-10-CM | POA: Diagnosis not present

## 2016-07-18 DIAGNOSIS — I10 Essential (primary) hypertension: Secondary | ICD-10-CM | POA: Diagnosis not present

## 2016-07-18 DIAGNOSIS — M48062 Spinal stenosis, lumbar region with neurogenic claudication: Secondary | ICD-10-CM | POA: Diagnosis not present

## 2016-07-25 DIAGNOSIS — Z79899 Other long term (current) drug therapy: Secondary | ICD-10-CM | POA: Diagnosis not present

## 2016-07-25 DIAGNOSIS — M0589 Other rheumatoid arthritis with rheumatoid factor of multiple sites: Secondary | ICD-10-CM | POA: Diagnosis not present

## 2016-07-26 DIAGNOSIS — R350 Frequency of micturition: Secondary | ICD-10-CM | POA: Diagnosis not present

## 2016-07-26 DIAGNOSIS — R413 Other amnesia: Secondary | ICD-10-CM | POA: Diagnosis not present

## 2016-07-26 DIAGNOSIS — F419 Anxiety disorder, unspecified: Secondary | ICD-10-CM | POA: Diagnosis not present

## 2016-08-20 DIAGNOSIS — K120 Recurrent oral aphthae: Secondary | ICD-10-CM | POA: Diagnosis not present

## 2016-08-30 DIAGNOSIS — M25571 Pain in right ankle and joints of right foot: Secondary | ICD-10-CM | POA: Diagnosis not present

## 2016-09-10 DIAGNOSIS — M0589 Other rheumatoid arthritis with rheumatoid factor of multiple sites: Secondary | ICD-10-CM | POA: Diagnosis not present

## 2016-10-17 DIAGNOSIS — Z1589 Genetic susceptibility to other disease: Secondary | ICD-10-CM | POA: Diagnosis not present

## 2016-10-17 DIAGNOSIS — M0589 Other rheumatoid arthritis with rheumatoid factor of multiple sites: Secondary | ICD-10-CM | POA: Diagnosis not present

## 2016-10-17 DIAGNOSIS — E669 Obesity, unspecified: Secondary | ICD-10-CM | POA: Diagnosis not present

## 2016-10-17 DIAGNOSIS — Z6836 Body mass index (BMI) 36.0-36.9, adult: Secondary | ICD-10-CM | POA: Diagnosis not present

## 2016-10-17 DIAGNOSIS — Z79899 Other long term (current) drug therapy: Secondary | ICD-10-CM | POA: Diagnosis not present

## 2016-10-17 DIAGNOSIS — M2550 Pain in unspecified joint: Secondary | ICD-10-CM | POA: Diagnosis not present

## 2016-10-17 DIAGNOSIS — M150 Primary generalized (osteo)arthritis: Secondary | ICD-10-CM | POA: Diagnosis not present

## 2016-10-22 DIAGNOSIS — Z79899 Other long term (current) drug therapy: Secondary | ICD-10-CM | POA: Diagnosis not present

## 2016-10-22 DIAGNOSIS — M0589 Other rheumatoid arthritis with rheumatoid factor of multiple sites: Secondary | ICD-10-CM | POA: Diagnosis not present

## 2016-10-31 DIAGNOSIS — Z471 Aftercare following joint replacement surgery: Secondary | ICD-10-CM | POA: Diagnosis not present

## 2016-10-31 DIAGNOSIS — Z96651 Presence of right artificial knee joint: Secondary | ICD-10-CM | POA: Diagnosis not present

## 2016-10-31 DIAGNOSIS — Z96652 Presence of left artificial knee joint: Secondary | ICD-10-CM | POA: Diagnosis not present

## 2016-10-31 DIAGNOSIS — Z96653 Presence of artificial knee joint, bilateral: Secondary | ICD-10-CM | POA: Diagnosis not present

## 2016-11-14 DIAGNOSIS — H2512 Age-related nuclear cataract, left eye: Secondary | ICD-10-CM | POA: Diagnosis not present

## 2016-11-14 DIAGNOSIS — H16223 Keratoconjunctivitis sicca, not specified as Sjogren's, bilateral: Secondary | ICD-10-CM | POA: Diagnosis not present

## 2016-11-20 DIAGNOSIS — M48062 Spinal stenosis, lumbar region with neurogenic claudication: Secondary | ICD-10-CM | POA: Diagnosis not present

## 2016-11-30 DIAGNOSIS — Z1231 Encounter for screening mammogram for malignant neoplasm of breast: Secondary | ICD-10-CM | POA: Diagnosis not present

## 2016-12-03 DIAGNOSIS — M0589 Other rheumatoid arthritis with rheumatoid factor of multiple sites: Secondary | ICD-10-CM | POA: Diagnosis not present

## 2016-12-07 DIAGNOSIS — Z471 Aftercare following joint replacement surgery: Secondary | ICD-10-CM | POA: Diagnosis not present

## 2016-12-07 DIAGNOSIS — Z96653 Presence of artificial knee joint, bilateral: Secondary | ICD-10-CM | POA: Diagnosis not present

## 2016-12-07 DIAGNOSIS — M48062 Spinal stenosis, lumbar region with neurogenic claudication: Secondary | ICD-10-CM | POA: Diagnosis not present

## 2016-12-13 DIAGNOSIS — M2042 Other hammer toe(s) (acquired), left foot: Secondary | ICD-10-CM | POA: Diagnosis not present

## 2016-12-13 DIAGNOSIS — B351 Tinea unguium: Secondary | ICD-10-CM | POA: Diagnosis not present

## 2016-12-13 DIAGNOSIS — M2041 Other hammer toe(s) (acquired), right foot: Secondary | ICD-10-CM | POA: Diagnosis not present

## 2016-12-21 DIAGNOSIS — B351 Tinea unguium: Secondary | ICD-10-CM | POA: Diagnosis not present

## 2017-01-15 DIAGNOSIS — M2042 Other hammer toe(s) (acquired), left foot: Secondary | ICD-10-CM | POA: Diagnosis not present

## 2017-01-15 DIAGNOSIS — M0589 Other rheumatoid arthritis with rheumatoid factor of multiple sites: Secondary | ICD-10-CM | POA: Diagnosis not present

## 2017-01-15 DIAGNOSIS — Z6836 Body mass index (BMI) 36.0-36.9, adult: Secondary | ICD-10-CM | POA: Diagnosis not present

## 2017-01-15 DIAGNOSIS — M79672 Pain in left foot: Secondary | ICD-10-CM | POA: Diagnosis not present

## 2017-01-15 DIAGNOSIS — Z79899 Other long term (current) drug therapy: Secondary | ICD-10-CM | POA: Diagnosis not present

## 2017-01-15 DIAGNOSIS — M2041 Other hammer toe(s) (acquired), right foot: Secondary | ICD-10-CM | POA: Diagnosis not present

## 2017-01-15 DIAGNOSIS — M150 Primary generalized (osteo)arthritis: Secondary | ICD-10-CM | POA: Diagnosis not present

## 2017-01-15 DIAGNOSIS — M2550 Pain in unspecified joint: Secondary | ICD-10-CM | POA: Diagnosis not present

## 2017-01-15 DIAGNOSIS — Z1589 Genetic susceptibility to other disease: Secondary | ICD-10-CM | POA: Diagnosis not present

## 2017-01-15 DIAGNOSIS — E669 Obesity, unspecified: Secondary | ICD-10-CM | POA: Diagnosis not present

## 2017-01-15 DIAGNOSIS — B351 Tinea unguium: Secondary | ICD-10-CM | POA: Diagnosis not present

## 2017-01-16 DIAGNOSIS — M0589 Other rheumatoid arthritis with rheumatoid factor of multiple sites: Secondary | ICD-10-CM | POA: Diagnosis not present

## 2017-01-16 DIAGNOSIS — Z79899 Other long term (current) drug therapy: Secondary | ICD-10-CM | POA: Diagnosis not present

## 2017-02-01 DIAGNOSIS — R05 Cough: Secondary | ICD-10-CM | POA: Diagnosis not present

## 2017-02-01 DIAGNOSIS — J302 Other seasonal allergic rhinitis: Secondary | ICD-10-CM | POA: Diagnosis not present

## 2017-02-06 DIAGNOSIS — Z01818 Encounter for other preprocedural examination: Secondary | ICD-10-CM | POA: Diagnosis not present

## 2017-02-11 DIAGNOSIS — M2042 Other hammer toe(s) (acquired), left foot: Secondary | ICD-10-CM | POA: Diagnosis not present

## 2017-02-14 DIAGNOSIS — M2042 Other hammer toe(s) (acquired), left foot: Secondary | ICD-10-CM | POA: Diagnosis not present

## 2017-02-27 DIAGNOSIS — M0589 Other rheumatoid arthritis with rheumatoid factor of multiple sites: Secondary | ICD-10-CM | POA: Diagnosis not present

## 2017-03-05 DIAGNOSIS — H16223 Keratoconjunctivitis sicca, not specified as Sjogren's, bilateral: Secondary | ICD-10-CM | POA: Diagnosis not present

## 2017-03-05 DIAGNOSIS — H2512 Age-related nuclear cataract, left eye: Secondary | ICD-10-CM | POA: Diagnosis not present

## 2017-04-10 DIAGNOSIS — M0589 Other rheumatoid arthritis with rheumatoid factor of multiple sites: Secondary | ICD-10-CM | POA: Diagnosis not present

## 2017-04-10 DIAGNOSIS — Z79899 Other long term (current) drug therapy: Secondary | ICD-10-CM | POA: Diagnosis not present

## 2017-04-17 DIAGNOSIS — Z6835 Body mass index (BMI) 35.0-35.9, adult: Secondary | ICD-10-CM | POA: Diagnosis not present

## 2017-04-17 DIAGNOSIS — M48062 Spinal stenosis, lumbar region with neurogenic claudication: Secondary | ICD-10-CM | POA: Diagnosis not present

## 2017-04-23 DIAGNOSIS — M0589 Other rheumatoid arthritis with rheumatoid factor of multiple sites: Secondary | ICD-10-CM | POA: Diagnosis not present

## 2017-04-23 DIAGNOSIS — Z6837 Body mass index (BMI) 37.0-37.9, adult: Secondary | ICD-10-CM | POA: Diagnosis not present

## 2017-04-23 DIAGNOSIS — M48062 Spinal stenosis, lumbar region with neurogenic claudication: Secondary | ICD-10-CM | POA: Diagnosis not present

## 2017-04-23 DIAGNOSIS — E669 Obesity, unspecified: Secondary | ICD-10-CM | POA: Diagnosis not present

## 2017-04-23 DIAGNOSIS — Z79899 Other long term (current) drug therapy: Secondary | ICD-10-CM | POA: Diagnosis not present

## 2017-04-23 DIAGNOSIS — M150 Primary generalized (osteo)arthritis: Secondary | ICD-10-CM | POA: Diagnosis not present

## 2017-04-23 DIAGNOSIS — Z1589 Genetic susceptibility to other disease: Secondary | ICD-10-CM | POA: Diagnosis not present

## 2017-04-23 DIAGNOSIS — M2550 Pain in unspecified joint: Secondary | ICD-10-CM | POA: Diagnosis not present

## 2017-05-03 DIAGNOSIS — Z23 Encounter for immunization: Secondary | ICD-10-CM | POA: Diagnosis not present

## 2017-05-22 DIAGNOSIS — M0589 Other rheumatoid arthritis with rheumatoid factor of multiple sites: Secondary | ICD-10-CM | POA: Diagnosis not present

## 2017-05-28 DIAGNOSIS — I10 Essential (primary) hypertension: Secondary | ICD-10-CM | POA: Diagnosis not present

## 2017-05-28 DIAGNOSIS — T50905A Adverse effect of unspecified drugs, medicaments and biological substances, initial encounter: Secondary | ICD-10-CM | POA: Diagnosis not present

## 2017-05-28 DIAGNOSIS — R5381 Other malaise: Secondary | ICD-10-CM | POA: Diagnosis not present

## 2017-07-03 DIAGNOSIS — Z79899 Other long term (current) drug therapy: Secondary | ICD-10-CM | POA: Diagnosis not present

## 2017-07-03 DIAGNOSIS — M0589 Other rheumatoid arthritis with rheumatoid factor of multiple sites: Secondary | ICD-10-CM | POA: Diagnosis not present

## 2017-07-08 DIAGNOSIS — Z6837 Body mass index (BMI) 37.0-37.9, adult: Secondary | ICD-10-CM | POA: Diagnosis not present

## 2017-07-08 DIAGNOSIS — M2550 Pain in unspecified joint: Secondary | ICD-10-CM | POA: Diagnosis not present

## 2017-07-08 DIAGNOSIS — E669 Obesity, unspecified: Secondary | ICD-10-CM | POA: Diagnosis not present

## 2017-07-08 DIAGNOSIS — Z1589 Genetic susceptibility to other disease: Secondary | ICD-10-CM | POA: Diagnosis not present

## 2017-07-08 DIAGNOSIS — R05 Cough: Secondary | ICD-10-CM | POA: Diagnosis not present

## 2017-07-08 DIAGNOSIS — M0589 Other rheumatoid arthritis with rheumatoid factor of multiple sites: Secondary | ICD-10-CM | POA: Diagnosis not present

## 2017-07-08 DIAGNOSIS — Z79899 Other long term (current) drug therapy: Secondary | ICD-10-CM | POA: Diagnosis not present

## 2017-07-08 DIAGNOSIS — M150 Primary generalized (osteo)arthritis: Secondary | ICD-10-CM | POA: Diagnosis not present

## 2017-07-19 DIAGNOSIS — M48062 Spinal stenosis, lumbar region with neurogenic claudication: Secondary | ICD-10-CM | POA: Diagnosis not present

## 2017-07-19 DIAGNOSIS — M7711 Lateral epicondylitis, right elbow: Secondary | ICD-10-CM | POA: Diagnosis not present

## 2017-07-24 DIAGNOSIS — M7711 Lateral epicondylitis, right elbow: Secondary | ICD-10-CM | POA: Diagnosis not present

## 2017-07-26 DIAGNOSIS — M7711 Lateral epicondylitis, right elbow: Secondary | ICD-10-CM | POA: Diagnosis not present

## 2017-07-26 DIAGNOSIS — J189 Pneumonia, unspecified organism: Secondary | ICD-10-CM | POA: Diagnosis not present

## 2017-08-01 DIAGNOSIS — M7711 Lateral epicondylitis, right elbow: Secondary | ICD-10-CM | POA: Diagnosis not present

## 2017-08-07 DIAGNOSIS — M7711 Lateral epicondylitis, right elbow: Secondary | ICD-10-CM | POA: Diagnosis not present

## 2017-08-09 DIAGNOSIS — M7711 Lateral epicondylitis, right elbow: Secondary | ICD-10-CM | POA: Diagnosis not present

## 2017-08-14 DIAGNOSIS — Z79899 Other long term (current) drug therapy: Secondary | ICD-10-CM | POA: Diagnosis not present

## 2017-08-14 DIAGNOSIS — M0589 Other rheumatoid arthritis with rheumatoid factor of multiple sites: Secondary | ICD-10-CM | POA: Diagnosis not present

## 2017-08-23 DIAGNOSIS — M25521 Pain in right elbow: Secondary | ICD-10-CM | POA: Diagnosis not present

## 2017-08-26 DIAGNOSIS — M48062 Spinal stenosis, lumbar region with neurogenic claudication: Secondary | ICD-10-CM | POA: Diagnosis not present

## 2017-08-26 DIAGNOSIS — Z6837 Body mass index (BMI) 37.0-37.9, adult: Secondary | ICD-10-CM | POA: Diagnosis not present

## 2017-08-26 DIAGNOSIS — I10 Essential (primary) hypertension: Secondary | ICD-10-CM | POA: Diagnosis not present

## 2017-09-05 DIAGNOSIS — H43813 Vitreous degeneration, bilateral: Secondary | ICD-10-CM | POA: Diagnosis not present

## 2017-09-05 DIAGNOSIS — H2512 Age-related nuclear cataract, left eye: Secondary | ICD-10-CM | POA: Diagnosis not present

## 2017-09-05 DIAGNOSIS — H16223 Keratoconjunctivitis sicca, not specified as Sjogren's, bilateral: Secondary | ICD-10-CM | POA: Diagnosis not present

## 2017-09-06 DIAGNOSIS — M48062 Spinal stenosis, lumbar region with neurogenic claudication: Secondary | ICD-10-CM | POA: Diagnosis not present

## 2017-09-12 DIAGNOSIS — M48061 Spinal stenosis, lumbar region without neurogenic claudication: Secondary | ICD-10-CM | POA: Diagnosis not present

## 2017-09-12 DIAGNOSIS — M48062 Spinal stenosis, lumbar region with neurogenic claudication: Secondary | ICD-10-CM | POA: Diagnosis not present

## 2017-09-19 DIAGNOSIS — Z6838 Body mass index (BMI) 38.0-38.9, adult: Secondary | ICD-10-CM | POA: Diagnosis not present

## 2017-09-19 DIAGNOSIS — M48062 Spinal stenosis, lumbar region with neurogenic claudication: Secondary | ICD-10-CM | POA: Diagnosis not present

## 2017-09-19 DIAGNOSIS — I10 Essential (primary) hypertension: Secondary | ICD-10-CM | POA: Diagnosis not present

## 2017-09-20 DIAGNOSIS — Z96651 Presence of right artificial knee joint: Secondary | ICD-10-CM | POA: Diagnosis not present

## 2017-09-20 DIAGNOSIS — Z96652 Presence of left artificial knee joint: Secondary | ICD-10-CM | POA: Diagnosis not present

## 2017-09-20 DIAGNOSIS — Z96653 Presence of artificial knee joint, bilateral: Secondary | ICD-10-CM | POA: Insufficient documentation

## 2017-09-25 DIAGNOSIS — M0589 Other rheumatoid arthritis with rheumatoid factor of multiple sites: Secondary | ICD-10-CM | POA: Diagnosis not present

## 2017-10-16 DIAGNOSIS — Z1589 Genetic susceptibility to other disease: Secondary | ICD-10-CM | POA: Diagnosis not present

## 2017-10-16 DIAGNOSIS — Z6838 Body mass index (BMI) 38.0-38.9, adult: Secondary | ICD-10-CM | POA: Diagnosis not present

## 2017-10-16 DIAGNOSIS — E669 Obesity, unspecified: Secondary | ICD-10-CM | POA: Diagnosis not present

## 2017-10-16 DIAGNOSIS — M2550 Pain in unspecified joint: Secondary | ICD-10-CM | POA: Diagnosis not present

## 2017-10-16 DIAGNOSIS — Z79899 Other long term (current) drug therapy: Secondary | ICD-10-CM | POA: Diagnosis not present

## 2017-10-16 DIAGNOSIS — M0589 Other rheumatoid arthritis with rheumatoid factor of multiple sites: Secondary | ICD-10-CM | POA: Diagnosis not present

## 2017-10-16 DIAGNOSIS — M150 Primary generalized (osteo)arthritis: Secondary | ICD-10-CM | POA: Diagnosis not present

## 2017-10-30 DIAGNOSIS — M48062 Spinal stenosis, lumbar region with neurogenic claudication: Secondary | ICD-10-CM | POA: Diagnosis not present

## 2017-11-06 DIAGNOSIS — M0589 Other rheumatoid arthritis with rheumatoid factor of multiple sites: Secondary | ICD-10-CM | POA: Diagnosis not present

## 2017-11-06 DIAGNOSIS — Z79899 Other long term (current) drug therapy: Secondary | ICD-10-CM | POA: Diagnosis not present

## 2017-11-29 DIAGNOSIS — M069 Rheumatoid arthritis, unspecified: Secondary | ICD-10-CM | POA: Diagnosis not present

## 2017-11-29 DIAGNOSIS — E559 Vitamin D deficiency, unspecified: Secondary | ICD-10-CM | POA: Diagnosis not present

## 2017-11-29 DIAGNOSIS — I10 Essential (primary) hypertension: Secondary | ICD-10-CM | POA: Diagnosis not present

## 2017-11-29 DIAGNOSIS — M4800 Spinal stenosis, site unspecified: Secondary | ICD-10-CM | POA: Diagnosis not present

## 2017-11-29 DIAGNOSIS — E7800 Pure hypercholesterolemia, unspecified: Secondary | ICD-10-CM | POA: Diagnosis not present

## 2017-11-29 DIAGNOSIS — F419 Anxiety disorder, unspecified: Secondary | ICD-10-CM | POA: Diagnosis not present

## 2017-11-29 DIAGNOSIS — R2681 Unsteadiness on feet: Secondary | ICD-10-CM | POA: Diagnosis not present

## 2017-11-29 DIAGNOSIS — R7301 Impaired fasting glucose: Secondary | ICD-10-CM | POA: Diagnosis not present

## 2017-12-04 DIAGNOSIS — H16223 Keratoconjunctivitis sicca, not specified as Sjogren's, bilateral: Secondary | ICD-10-CM | POA: Diagnosis not present

## 2017-12-04 DIAGNOSIS — H1132 Conjunctival hemorrhage, left eye: Secondary | ICD-10-CM | POA: Diagnosis not present

## 2017-12-04 DIAGNOSIS — H43813 Vitreous degeneration, bilateral: Secondary | ICD-10-CM | POA: Diagnosis not present

## 2017-12-06 DIAGNOSIS — Z1231 Encounter for screening mammogram for malignant neoplasm of breast: Secondary | ICD-10-CM | POA: Diagnosis not present

## 2017-12-18 DIAGNOSIS — M0589 Other rheumatoid arthritis with rheumatoid factor of multiple sites: Secondary | ICD-10-CM | POA: Diagnosis not present

## 2017-12-25 ENCOUNTER — Encounter: Payer: Self-pay | Admitting: Physical Therapy

## 2017-12-25 ENCOUNTER — Ambulatory Visit: Payer: Medicare Other | Attending: Family Medicine | Admitting: Physical Therapy

## 2017-12-25 DIAGNOSIS — M25562 Pain in left knee: Secondary | ICD-10-CM | POA: Diagnosis not present

## 2017-12-25 DIAGNOSIS — G8929 Other chronic pain: Secondary | ICD-10-CM | POA: Diagnosis not present

## 2017-12-25 DIAGNOSIS — R2681 Unsteadiness on feet: Secondary | ICD-10-CM

## 2017-12-25 DIAGNOSIS — M545 Low back pain: Secondary | ICD-10-CM | POA: Insufficient documentation

## 2017-12-25 DIAGNOSIS — M25662 Stiffness of left knee, not elsewhere classified: Secondary | ICD-10-CM | POA: Insufficient documentation

## 2017-12-25 DIAGNOSIS — R262 Difficulty in walking, not elsewhere classified: Secondary | ICD-10-CM

## 2017-12-25 NOTE — Therapy (Signed)
[facility] [address] [place], [place], [address] Phone: [phone]   Fax:  [phone]  Physical Therapy Evaluation  Patient Details  Name: NETHRA MEHLBERG MRN: [id number] Date of Birth: 1940-09-10 Referring Provider: Carlota Raspberry   Encounter Date: 12/25/2017  PT End of Session - 12/25/17 1341    Visit Number  1    Date for PT Re-Evaluation  02/24/18    PT Start Time  1310    PT Stop Time  1400    PT Time Calculation (min)  50 min    Activity Tolerance  Patient tolerated treatment well    Behavior During Therapy  [facility] for tasks assessed/performed       Past Medical History:  Diagnosis Date  . Arthritis    RHEUMATOID  . Bronchitis   . CTS (carpal tunnel syndrome)   . Dysrhythmia    "irregularity" unknown at this time - being evaluated by cardiology  . Essential hypertension 11/24/2015  . GERD (gastroesophageal reflux disease)   . Hypercholesteremia   . Hyperlipidemia 11/24/2015  . Hypertension   . IBS (irritable bowel syndrome)   . OAB (overactive bladder)   . Osteoarthritis   . Osteoporosis   . RA (rheumatoid arthritis) ([facility])   . Rheumatoid arthritis ([facility]) 11/24/2015  . Seasonal allergies     Past Surgical History:  Procedure Laterality Date  . ABDOMINAL HYSTERECTOMY  1995  . BACK SURGERY    . BREAST SURGERY     REDUCTION  . CATARACT EXTRACTION W/PHACO  08/01/2012   Procedure: CATARACT EXTRACTION PHACO AND INTRAOCULAR LENS PLACEMENT (IOC);  Surgeon: Chalmers Guest, MD;  Location: [facility];  Service: Ophthalmology;  Laterality: Right;  . HERNIA REPAIR     RIGHT ING.  . JOINT REPLACEMENT  2014   rt total knee  . TONSILLECTOMY    . 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   Subjective Assessment - 12/25/17 1311    Subjective  Patient  reports that since the left knee replacement in 2017 she has felt off balance and had a few falls.  She reports that an orthopod gave her a lift to put in the left shoe.  She is unsure if it helps    Pertinent History  bilateral TKR's, RA, lumbar fusion, spinal stenosis, AS    Limitations  Walking    Patient Stated Goals  fell more stable when walking    Currently in Pain?  No/denies    Pain Score  0-No pain    Pain Location  Back    Pain Orientation  Lower    Pain Descriptors / Indicators  Aching    Pain Type  Chronic pain    Pain Onset  More than a month ago    Pain Frequency  Constant    Aggravating Factors   reports worse in the AM, more activity will increase her pain up to 9/10     Pain Relieving Factors  reports that Aleve helps, pain at best 4/10    Effect of Pain on Daily Activities  pain in the back really limits everything         [facility] PT Assessment - 12/25/17 0001      Assessment   Medical Diagnosis  back pain, gait instability    Referring  Provider  Carlota Raspberry    Onset Date/Surgical Date  11/25/17    Prior Therapy  after left TKR in 2017      Precautions   Precautions  None      Balance Screen   Has the patient fallen in the past 6 months  No    Has the patient had a decrease in activity level because of a fear of falling?   Yes    Is the patient reluctant to leave their home because of a fear of falling?   Yes      Home Environment   Additional Comments  has stairs, does her housework, does some yardwork      Prior Function   Level of Independence  Independent    Vocation  Retired;Volunteer work    Leisure  would like to return to chair yoga and water aerobics      Posture/Postural Control   Posture Comments  fwd head, rounded shoulder posture, she cannot straighten up when standing, she is in forward flexed trunk position      ROM / Strength   AROM / PROM / Strength  AROM;Strength      AROM   Overall AROM Comments  lumbar ROM was WFL's except for   extension was decresaed 100% could not get to neutral      Strength   Overall Strength Comments  LE's 4/5 with some back pain with hip tests      Flexibility   Soft Tissue Assessment /Muscle Length  no      Palpation   Palpation comment  she is tender in the lumbar area, with spasms, she has tenderness in the buttocks      Standardized Balance Assessment   Standardized Balance Assessment  Timed Up and Go Test;Berg Balance Test      Berg Balance Test   Sit to Stand  Able to stand without using hands and stabilize independently    Standing Unsupported  Able to stand safely 2 minutes    Sitting with Back Unsupported but Feet Supported on Floor or Stool  Able to sit safely and securely 2 minutes    Stand to Sit  Sits safely with minimal use of hands    Transfers  Able to transfer safely, minor use of hands    Standing Unsupported with Eyes Closed  Able to stand 10 seconds with supervision    Standing Ubsupported with Feet Together  Able to place feet together independently and stand for 1 minute with supervision    From Standing, Reach Forward with Outstretched Arm  Can reach confidently >25 cm (10")    From Standing Position, Pick up Object from Floor  Able to pick up shoe safely and easily    From Standing Position, Turn to Look Behind Over each Shoulder  Looks behind from both sides and weight shifts well    Turn 360 Degrees  Able to turn 360 degrees safely one side only in 4 seconds or less    Standing Unsupported, Alternately Place Feet on Step/Stool  Able to stand independently and complete 8 steps >20 seconds    Standing Unsupported, One Foot in Front  Able to plae foot ahead of the other independently and hold 30 seconds    Standing on One Leg  Able to lift leg independently and hold equal to or more than 3 seconds    Total Score  49      Timed Up and Go Test   Normal  TUG (seconds)  19                Objective measurements completed on examination: See above findings.       [facility] Adult PT Treatment/Exercise - 12/25/17 0001      Modalities   Modalities  Moist Heat;Electrical Stimulation      Moist Heat Therapy   Number Minutes Moist Heat  15 Minutes    Moist Heat Location  Lumbar Spine      Electrical Stimulation   Electrical Stimulation Location  L/S area    Electrical Stimulation Action  IFC    Electrical Stimulation Parameters  supine    Electrical Stimulation Goals  Pain             PT Education - 12/25/17 1340    Education provided  Yes    Education Details  gave initial HEP for 4way hip  and side stepping to start balance    Person(s) Educated  Patient    Methods  Explanation;Demonstration;Handout    Comprehension  Verbalized understanding       PT Short Term Goals - 12/25/17 1437      PT SHORT TERM GOAL #1   Title  Independent with initial HEP     Time  2    Period  Weeks    Status  New        PT Long Term Goals - 12/25/17 1437      PT LONG TERM GOAL #1   Title  Independent with advanced HEP to include balance    Time  8    Period  Weeks    Status  New      PT LONG TERM GOAL #2   Title  patient will report 50% decrease fear of falls    Time  8    Period  Weeks    Status  New      PT LONG TERM GOAL #3   Title  increase Berg score to 52/56    Time  8    Period  Weeks    Status  New      PT LONG TERM GOAL #4   Title  decrease TUG time to 14 seconds    Time  8    Period  Weeks    Status  New      PT LONG TERM GOAL #5   Title  decrease pain 25%    Time  8    Period  Weeks    Status  New             Plan - 12/25/17 1341    Clinical Impression Statement  Patient reports some difficulty with balance over the past year, she reports low back surgery inthe past and reports that she is in significant LBP and has MD appointments schedules to discuss more surgery in the future.  She reports that she has had two falls in the past.  Her TUG was 19 seconds and she scored 49/56 on the Berg.  Back pain  seems to be a very limiting factor    Clinical Presentation  Stable    Clinical Decision Making  Low    Rehab Potential  Good    PT Frequency  2x / week    PT Duration  8 weeks    PT Treatment/Interventions  ADLs/Self Care Home Management;Cryotherapy;Electrical Stimulation;Moist Heat;Balance training;Neuromuscular re-education;Therapeutic exercise;Therapeutic activities;Functional mobility training;Stair training;Patient/family education;Manual techniques    PT Next Visit Plan  slowly start  exercises and balance, treat pain as needed    Consulted and Agree with Plan of Care  Patient       Patient will benefit from skilled therapeutic intervention in order to improve the following deficits and impairments:  Difficulty walking, Decreased range of motion, Increased muscle spasms, Pain, Improper body mechanics, Decreased strength, Decreased mobility  Visit Diagnosis: Difficulty in walking, not elsewhere classified - Plan: PT plan of care cert/re-cert  Chronic bilateral low back pain without sciatica - Plan: PT plan of care cert/re-cert  Unsteady gait - Plan: PT plan of care cert/re-cert     Problem List Patient Active Problem List   Diagnosis Date Noted  . OA (osteoarthritis) of knee 12/26/2015  . Essential hypertension 11/24/2015  . Hyperlipidemia 11/24/2015  . Rheumatoid arthritis ([facility]) 11/24/2015  . Lumbar spondylosis 07/21/2014    Jearld Lesch., PT 12/25/2017, 2:42 PM  [facility] [address] [place], [place], [address] Phone: [phone]   Fax:  [phone]  Name: TANISI SOROLA MRN: [id number] Date of Birth: 1941-06-08

## 2018-01-01 ENCOUNTER — Encounter: Payer: Self-pay | Admitting: Physical Therapy

## 2018-01-01 ENCOUNTER — Ambulatory Visit: Payer: Medicare Other | Admitting: Physical Therapy

## 2018-01-01 DIAGNOSIS — R262 Difficulty in walking, not elsewhere classified: Secondary | ICD-10-CM

## 2018-01-01 DIAGNOSIS — R2681 Unsteadiness on feet: Secondary | ICD-10-CM

## 2018-01-01 DIAGNOSIS — M545 Low back pain: Secondary | ICD-10-CM

## 2018-01-01 DIAGNOSIS — G8929 Other chronic pain: Secondary | ICD-10-CM | POA: Diagnosis not present

## 2018-01-01 DIAGNOSIS — M25562 Pain in left knee: Secondary | ICD-10-CM | POA: Diagnosis not present

## 2018-01-01 DIAGNOSIS — M25662 Stiffness of left knee, not elsewhere classified: Secondary | ICD-10-CM | POA: Diagnosis not present

## 2018-01-01 NOTE — Therapy (Signed)
[facility] [place] [address] [place], [place], [address] Phone: [phone]   Fax:  [phone]  Physical Therapy Treatment  Patient Details  Name: Joy [name] MRN: [id number] Date of Birth: 26-Feb-1941 Referring Provider: Carlota Raspberry   Encounter Date: 01/01/2018  PT End of Session - 01/01/18 1405    Visit Number  2    Date for PT Re-Evaluation  02/24/18    PT Start Time  1317    PT Stop Time  1412    PT Time Calculation (min)  55 min    Activity Tolerance  Patient tolerated treatment well    Behavior During Therapy  [facility] for tasks assessed/performed       Past Medical History:  Diagnosis Date  . Arthritis    RHEUMATOID  . Bronchitis   . CTS (carpal tunnel syndrome)   . Dysrhythmia    "irregularity" unknown at this time - being evaluated by cardiology  . Essential hypertension 11/24/2015  . GERD (gastroesophageal reflux disease)   . Hypercholesteremia   . Hyperlipidemia 11/24/2015  . Hypertension   . IBS (irritable bowel syndrome)   . OAB (overactive bladder)   . Osteoarthritis   . Osteoporosis   . RA (rheumatoid arthritis) (HCC)   . Rheumatoid arthritis (HCC) 11/24/2015  . Seasonal allergies     Past Surgical History:  Procedure Laterality Date  . ABDOMINAL HYSTERECTOMY  1995  . BACK SURGERY    . BREAST SURGERY     REDUCTION  . CATARACT EXTRACTION W/PHACO  08/01/2012   Procedure: CATARACT EXTRACTION PHACO AND INTRAOCULAR LENS PLACEMENT (IOC);  Surgeon: Chalmers Guest, MD;  Location: [facility] OR;  Service: Ophthalmology;  Laterality: Right;  . HERNIA REPAIR     RIGHT ING.  . JOINT REPLACEMENT  2014   rt total knee  . TONSILLECTOMY    . 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  Subjective Assessment - 01/01/18 1321    Subjective  Patient  reports that she went to chair yoga yesterday for the first time in about 3-4 weeks.  She reports that her knees are very sore, she reports that she felt good doing the class    Currently in Pain?  Yes    Pain Score  3     Pain Location  Back    Pain Orientation  Lower                       [facility] Adult PT Treatment/Exercise - 01/01/18 0001      High Level Balance   High Level Balance Activities  Side stepping;Backward walking;Tandem walking    High Level Balance Comments  on airex ball toss      Exercises   Exercises  Knee/Hip      Knee/Hip Exercises: Stretches   Passive Hamstring Stretch  2 reps;20 seconds    Piriformis Stretch  2 reps;20 seconds      Knee/Hip Exercises: Aerobic   Nustep  level 4 x 6 minutes      Knee/Hip Exercises: Machines for Strengthening   Cybex Knee Extension  5# 2x10    Cybex Knee Flexion  20# 2x10      Knee/Hip Exercises: Supine   Other Supine Knee/Hip Exercises  feet on ball K2C, trunk rotation ,  small bridges and then abdominal isometrics      Moist Heat Therapy   Number Minutes Moist Heat  15 Minutes    Moist Heat Location  Lumbar Spine      Electrical Stimulation   Electrical Stimulation Location  L/S area    Electrical Stimulation Action  IFC    Electrical Stimulation Parameters  supine    Electrical Stimulation Goals  Pain               PT Short Term Goals - 12/25/17 1437      PT SHORT TERM GOAL #1   Title  Independent with initial HEP     Time  2    Period  Weeks    Status  New        PT Long Term Goals - 12/25/17 1437      PT LONG TERM GOAL #1   Title  Independent with advanced HEP to include balance    Time  8    Period  Weeks    Status  New      PT LONG TERM GOAL #2   Title  patient will report 50% decrease fear of falls    Time  8    Period  Weeks    Status  New      PT LONG TERM GOAL #3   Title  increase Berg score to 52/56    Time  8    Period  Weeks    Status  New      PT LONG TERM  GOAL #4   Title  decrease TUG time to 14 seconds    Time  8    Period  Weeks    Status  New      PT LONG TERM GOAL #5   Title  decrease pain 25%    Time  8    Period  Weeks    Status  New            Plan - 01/01/18 1406    Clinical Impression Statement  Patient reports that she felt pretty good afte rthe last treatment, she is wanting to return to her chair yoga and some water aerobics.  She had some difficulty with the balance today on the airex    PT Next Visit Plan  continue to add as tolerated    Consulted and Agree with Plan of Care  Patient       Patient will benefit from skilled therapeutic intervention in order to improve the following deficits and impairments:  Difficulty walking, Decreased range of motion, Increased muscle spasms, Pain, Improper body mechanics, Decreased strength, Decreased mobility  Visit Diagnosis: Difficulty in walking, not elsewhere classified  Chronic bilateral low back pain without sciatica  Unsteady gait  Stiffness of left knee, not elsewhere classified  Acute pain of left knee     Problem List Patient Active Problem List   Diagnosis Date Noted  . OA (osteoarthritis) of knee 12/26/2015  . Essential hypertension 11/24/2015  . Hyperlipidemia 11/24/2015  . Rheumatoid arthritis (HCC) 11/24/2015  . Lumbar spondylosis 07/21/2014    Jearld Lesch., PT 01/01/2018, 2:10 PM  [facility] [place] [address] [place], [place], [address] Phone: [phone]   Fax:  [phone]  Name: NAZIA RHINES MRN: [id number] Date of Birth: May 29, 1941

## 2018-01-02 ENCOUNTER — Encounter: Payer: Self-pay | Admitting: Physical Therapy

## 2018-01-02 ENCOUNTER — Ambulatory Visit: Payer: Medicare Other | Admitting: Physical Therapy

## 2018-01-02 DIAGNOSIS — R2681 Unsteadiness on feet: Secondary | ICD-10-CM | POA: Diagnosis not present

## 2018-01-02 DIAGNOSIS — M545 Low back pain, unspecified: Secondary | ICD-10-CM

## 2018-01-02 DIAGNOSIS — R262 Difficulty in walking, not elsewhere classified: Secondary | ICD-10-CM

## 2018-01-02 DIAGNOSIS — M25562 Pain in left knee: Secondary | ICD-10-CM | POA: Diagnosis not present

## 2018-01-02 DIAGNOSIS — G8929 Other chronic pain: Secondary | ICD-10-CM | POA: Diagnosis not present

## 2018-01-02 DIAGNOSIS — M25662 Stiffness of left knee, not elsewhere classified: Secondary | ICD-10-CM | POA: Diagnosis not present

## 2018-01-02 NOTE — Therapy (Signed)
[facility] [place] [facility] [address] [place], [place], [address] Phone: [phone]   Fax:  [phone]  Physical Therapy Treatment  Patient Details  Name: Joy [name] MRN: [id number] Date of Birth: [birth year] Referring Provider: Carlota Raspberry   Encounter Date: 01/02/2018  PT End of Session - 01/02/18 1644    Visit Number  3    Date for PT Re-Evaluation  02/24/18    PT Start Time  1610    PT Stop Time  1655    PT Time Calculation (min)  45 min    Activity Tolerance  Patient tolerated treatment well    Behavior During Therapy  [facility] for tasks assessed/performed       Past Medical History:  Diagnosis Date  . Arthritis    RHEUMATOID  . Bronchitis   . CTS (carpal tunnel syndrome)   . Dysrhythmia    "irregularity" unknown at this time - being evaluated by cardiology  . Essential hypertension 11/24/2015  . GERD (gastroesophageal reflux disease)   . Hypercholesteremia   . Hyperlipidemia 11/24/2015  . Hypertension   . IBS (irritable bowel syndrome)   . OAB (overactive bladder)   . Osteoarthritis   . Osteoporosis   . RA (rheumatoid arthritis) (HCC)   . Rheumatoid arthritis (HCC) 11/24/2015  . Seasonal allergies     Past Surgical History:  Procedure Laterality Date  . ABDOMINAL HYSTERECTOMY  1995  . BACK SURGERY    . BREAST SURGERY     REDUCTION  . CATARACT EXTRACTION W/PHACO  08/01/2012   Procedure: CATARACT EXTRACTION PHACO AND INTRAOCULAR LENS PLACEMENT (IOC);  Surgeon: Chalmers Guest, MD;  Location: [facility] OR;  Service: Ophthalmology;  Laterality: Right;  . HERNIA REPAIR     RIGHT ING.  . JOINT REPLACEMENT  2014   rt total knee  . TONSILLECTOMY    . 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  Subjective Assessment - 01/02/18 1639    Subjective  Patient  reports tha tshe woke up today in a lot more pain, pain is mostly in the right leg and hip area, she is unsure what caused the pain    Currently in Pain?  Yes    Pain Score  7     Pain Location  Hip    Pain Orientation  Right    Pain Relieving Factors  heat helps                       [facility] Adult PT Treatment/Exercise - 01/02/18 0001      Knee/Hip Exercises: Stretches   Passive Hamstring Stretch  4 reps;20 seconds    Quad Stretch  3 reps;20 seconds    ITB Stretch  3 reps;20 seconds    Piriformis Stretch  4 reps;20 seconds      Knee/Hip Exercises: Aerobic   Nustep  level 4 x 6 minutes      Knee/Hip Exercises: Supine   Other Supine Knee/Hip Exercises  feet on ball K2C, trunk rotation , small bridges and then abdominal isometrics      Moist Heat Therapy   Number Minutes Moist Heat  15 Minutes    Moist Heat Location  Lumbar Spine      Electrical Stimulation   Electrical Stimulation Location  right lateral hipright  hip and lateral thigh    Electrical Stimulation Action  IFC    Electrical Stimulation Parameters  supine    Electrical Stimulation Goals  Pain               PT Short Term Goals - 01/02/18 1646      PT SHORT TERM GOAL #1   Title  Independent with initial HEP     Status  On-going        PT Long Term Goals - 12/25/17 1437      PT LONG TERM GOAL #1   Title  Independent with advanced HEP to include balance    Time  8    Period  Weeks    Status  New      PT LONG TERM GOAL #2   Title  patient will report 50% decrease fear of falls    Time  8    Period  Weeks    Status  New      PT LONG TERM GOAL #3   Title  increase Berg score to 52/56    Time  8    Period  Weeks    Status  New      PT LONG TERM GOAL #4   Title  decrease TUG time to 14 seconds    Time  8    Period  Weeks    Status  New      PT LONG TERM GOAL #5   Title  decrease pain 25%    Time  8    Period  Weeks    Status  New            Plan - 01/02/18 1645     Clinical Impression Statement  Patient with a lot of increased pain in the right hip and lateral leg, she is unsure of a cause, we did more stretches today and this seemed to help some but reports that it does hurt.  She was very limited in what she wanted to do due to the pain    PT Next Visit Plan  continue to add as tolerated    Consulted and Agree with Plan of Care  Patient       Patient will benefit from skilled therapeutic intervention in order to improve the following deficits and impairments:  Difficulty walking, Decreased range of motion, Increased muscle spasms, Pain, Improper body mechanics, Decreased strength, Decreased mobility  Visit Diagnosis: Difficulty in walking, not elsewhere classified  Chronic bilateral low back pain without sciatica  Unsteady gait  Stiffness of left knee, not elsewhere classified  Acute pain of left knee     Problem List Patient Active Problem List   Diagnosis Date Noted  . OA (osteoarthritis) of knee 12/26/2015  . Essential hypertension 11/24/2015  . Hyperlipidemia 11/24/2015  . Rheumatoid arthritis (HCC) 11/24/2015  . Lumbar spondylosis 07/21/2014    Jearld Lesch., PT 01/02/2018, 4:47 PM  [facility] [place] [address] [place], [place], [address] Phone: [phone]   Fax:  [phone]  Name: Joy [name] MRN: [id number] Date of Birth: Jun 29, 1941

## 2018-01-08 ENCOUNTER — Ambulatory Visit: Payer: Medicare Other | Admitting: Physical Therapy

## 2018-01-08 ENCOUNTER — Encounter: Payer: Self-pay | Admitting: Physical Therapy

## 2018-01-08 DIAGNOSIS — M545 Low back pain, unspecified: Secondary | ICD-10-CM

## 2018-01-08 DIAGNOSIS — G8929 Other chronic pain: Secondary | ICD-10-CM | POA: Diagnosis not present

## 2018-01-08 DIAGNOSIS — R2681 Unsteadiness on feet: Secondary | ICD-10-CM

## 2018-01-08 DIAGNOSIS — M25662 Stiffness of left knee, not elsewhere classified: Secondary | ICD-10-CM | POA: Diagnosis not present

## 2018-01-08 DIAGNOSIS — R262 Difficulty in walking, not elsewhere classified: Secondary | ICD-10-CM

## 2018-01-08 DIAGNOSIS — M25562 Pain in left knee: Secondary | ICD-10-CM

## 2018-01-08 NOTE — Therapy (Signed)
[facility] [place] [facility] [address] [place], [place], [address] Phone: [phone]   Fax:  [phone]  Physical Therapy Treatment  Patient Details  Name: Joy [name] MRN: [id number] Date of Birth: [birth year] Referring Provider: Carlota Raspberry   Encounter Date: 01/08/2018  PT End of Session - 01/08/18 1348    Visit Number  4    Date for PT Re-Evaluation  02/24/18    PT Start Time  1311    PT Stop Time  1400    PT Time Calculation (min)  49 min    Activity Tolerance  Patient tolerated treatment well    Behavior During Therapy  [facility] for tasks assessed/performed       Past Medical History:  Diagnosis Date  . Arthritis    RHEUMATOID  . Bronchitis   . CTS (carpal tunnel syndrome)   . Dysrhythmia    "irregularity" unknown at this time - being evaluated by cardiology  . Essential hypertension 11/24/2015  . GERD (gastroesophageal reflux disease)   . Hypercholesteremia   . Hyperlipidemia 11/24/2015  . Hypertension   . IBS (irritable bowel syndrome)   . OAB (overactive bladder)   . Osteoarthritis   . Osteoporosis   . RA (rheumatoid arthritis) (HCC)   . Rheumatoid arthritis (HCC) 11/24/2015  . Seasonal allergies     Past Surgical History:  Procedure Laterality Date  . ABDOMINAL HYSTERECTOMY  1995  . BACK SURGERY    . BREAST SURGERY     REDUCTION  . CATARACT EXTRACTION W/PHACO  08/01/2012   Procedure: CATARACT EXTRACTION PHACO AND INTRAOCULAR LENS PLACEMENT (IOC);  Surgeon: Chalmers Guest, MD;  Location: [facility] OR;  Service: Ophthalmology;  Laterality: Right;  . HERNIA REPAIR     RIGHT ING.  . JOINT REPLACEMENT  2014   rt total knee  . TONSILLECTOMY    . 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  Subjective Assessment - 01/08/18 1312    Subjective  Patient  reports that she slipped going into her house on Sunday, she reports the welcome mat slipped.  She reports that she landed on her left side, reports that she is having some pain but not bad    Currently in Pain?  Yes    Pain Score  3     Pain Location  Hip    Pain Orientation  Left    Pain Descriptors / Indicators  Aching;Sore    Aggravating Factors   falling                       [facility] Adult PT Treatment/Exercise - 01/08/18 0001      High Level Balance   High Level Balance Activities  Side stepping;Backward walking;Tandem walking;Negotitating around obstacles;Negotiating over obstacles    High Level Balance Comments  on airex ball toss, eyes closed, head turns, resisted gait, toe walk, heel walk,       Knee/Hip Exercises: Stretches   Passive Hamstring Stretch  4 reps;20 seconds    Quad Stretch  3 reps;20 seconds    ITB Stretch  3 reps;20 seconds    Piriformis Stretch  4 reps;20 seconds      Knee/Hip Exercises: Aerobic   Nustep  level 5 x 6 minutes      Knee/Hip Exercises: Machines  for Strengthening   Cybex Knee Extension  5# 2x10    Cybex Knee Flexion  20# 2x10    Cybex Leg Press  20# 2x10, this caused some pain in the center  of the back      Knee/Hip Exercises: Standing   Lateral Step Up  Step Height: 2";20 reps;Both    Forward Step Up  Step Height: 2";Both;20 reps      Knee/Hip Exercises: Supine   Other Supine Knee/Hip Exercises  feet on ball K2C, trunk rotation , small bridges and then abdominal isometrics      Moist Heat Therapy   Number Minutes Moist Heat  15 Minutes    Moist Heat Location  Lumbar Spine      Electrical Stimulation   Electrical Stimulation Location  lumbar area    Electrical Stimulation Action  IFC    Electrical Stimulation Parameters  supine    Electrical Stimulation Goals  Pain               PT Short Term Goals - 01/08/18 1350      PT SHORT TERM GOAL #1   Title  Independent with initial HEP     Status  Achieved         PT Long Term Goals - 01/08/18 1350      PT LONG TERM GOAL #2   Title  patient will report 50% decrease fear of falls    Status  On-going      PT LONG TERM GOAL #5   Title  decrease pain 25%    Status  On-going            Plan - 01/08/18 1349    Clinical Impression Statement  Patient continues with pain, as she fatigues she gets more and more into a forward flexed trunk posture and shuffles feet.  She did have difficulty with the airex stepping off and on.  Needing close supervision to CGA to stabilize    PT Next Visit Plan  add balance and strength as tolerated    Consulted and Agree with Plan of Care  Patient       Patient will benefit from skilled therapeutic intervention in order to improve the following deficits and impairments:  Difficulty walking, Decreased range of motion, Increased muscle spasms, Pain, Improper body mechanics, Decreased strength, Decreased mobility  Visit Diagnosis: Difficulty in walking, not elsewhere classified  Chronic bilateral low back pain without sciatica  Unsteady gait  Stiffness of left knee, not elsewhere classified  Acute pain of left knee     Problem List Patient Active Problem List   Diagnosis Date Noted  . OA (osteoarthritis) of knee 12/26/2015  . Essential hypertension 11/24/2015  . Hyperlipidemia 11/24/2015  . Rheumatoid arthritis (HCC) 11/24/2015  . Lumbar spondylosis 07/21/2014    Jearld Lesch., PT 01/08/2018, 1:52 PM  [facility] [place] [address] [place], [place], [address] Phone: [phone]   Fax:  [phone]  Name: Joy [name] MRN: [id number] Date of Birth: 1941/05/04

## 2018-01-09 ENCOUNTER — Ambulatory Visit: Payer: Medicare Other | Admitting: Physical Therapy

## 2018-01-09 ENCOUNTER — Encounter: Payer: Self-pay | Admitting: Physical Therapy

## 2018-01-09 DIAGNOSIS — R262 Difficulty in walking, not elsewhere classified: Secondary | ICD-10-CM

## 2018-01-09 DIAGNOSIS — M545 Low back pain, unspecified: Secondary | ICD-10-CM

## 2018-01-09 DIAGNOSIS — M25562 Pain in left knee: Secondary | ICD-10-CM | POA: Diagnosis not present

## 2018-01-09 DIAGNOSIS — G8929 Other chronic pain: Secondary | ICD-10-CM | POA: Diagnosis not present

## 2018-01-09 DIAGNOSIS — R2681 Unsteadiness on feet: Secondary | ICD-10-CM

## 2018-01-09 DIAGNOSIS — M25662 Stiffness of left knee, not elsewhere classified: Secondary | ICD-10-CM | POA: Diagnosis not present

## 2018-01-09 NOTE — Therapy (Signed)
[facility] [place] [facility] [address] [place], [place], [address] Phone: [phone]   Fax:  [phone]  Physical Therapy Treatment  Patient Details  Name: Joy [name] MRN: [id number] Date of Birth: 1941/04/12 Referring Provider: Carlota Raspberry   Encounter Date: 01/09/2018  PT End of Session - 01/09/18 1731    Visit Number  5    Date for PT Re-Evaluation  02/24/18    PT Start Time  1653    PT Stop Time  1740    PT Time Calculation (min)  47 min    Activity Tolerance  Patient limited by pain;Patient limited by fatigue;Patient limited by lethargy       Past Medical History:  Diagnosis Date  . Arthritis    RHEUMATOID  . Bronchitis   . CTS (carpal tunnel syndrome)   . Dysrhythmia    "irregularity" unknown at this time - being evaluated by cardiology  . Essential hypertension 11/24/2015  . GERD (gastroesophageal reflux disease)   . Hypercholesteremia   . Hyperlipidemia 11/24/2015  . Hypertension   . IBS (irritable bowel syndrome)   . OAB (overactive bladder)   . Osteoarthritis   . Osteoporosis   . RA (rheumatoid arthritis) (HCC)   . Rheumatoid arthritis (HCC) 11/24/2015  . Seasonal allergies     Past Surgical History:  Procedure Laterality Date  . ABDOMINAL HYSTERECTOMY  1995  . BACK SURGERY    . BREAST SURGERY     REDUCTION  . CATARACT EXTRACTION W/PHACO  08/01/2012   Procedure: CATARACT EXTRACTION PHACO AND INTRAOCULAR LENS PLACEMENT (IOC);  Surgeon: Chalmers Guest, MD;  Location: [facility] OR;  Service: Ophthalmology;  Laterality: Right;  . HERNIA REPAIR     RIGHT ING.  . JOINT REPLACEMENT  2014   rt total knee  . TONSILLECTOMY    . 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  Subjective Assessment - 01/09/18 1728    Subjective  Patient comes in looking  very tired, she reports that she feel "yucky", states that she is hurting a little more.    Currently in Pain?  Yes    Pain Score  6     Pain Location  Hip    Pain Orientation  Right    Pain Descriptors / Indicators  Sore                       [facility] Adult PT Treatment/Exercise - 01/09/18 0001      Moist Heat Therapy   Number Minutes Moist Heat  15 Minutes    Moist Heat Location  Lumbar Spine      Electrical Stimulation   Electrical Stimulation Location  lumbar area    Electrical Stimulation Action  IFC    Electrical Stimulation Parameters  supine    Electrical Stimulation Goals  Pain      Manual Therapy   Manual Therapy  Soft tissue mobilization    Manual therapy comments  PROM to the LE's and the back, very gentle trying to get her moving some, some ITB stretches    Soft tissue mobilization  to the right gluteal and into the rigfht ITB               PT Short Term Goals - 01/08/18 1350  PT SHORT TERM GOAL #1   Title  Independent with initial HEP     Status  Achieved        PT Long Term Goals - 01/09/18 1739      PT LONG TERM GOAL #1   Title  Independent with advanced HEP to include balance    Status  On-going      PT LONG TERM GOAL #2   Title  patient will report 50% decrease fear of falls    Status  On-going      PT LONG TERM GOAL #4   Title  decrease TUG time to 14 seconds    Status  On-going      PT LONG TERM GOAL #5   Title  decrease pain 25%    Status  On-going            Plan - 01/09/18 1732    Clinical Impression Statement  Patient comes in today reporting that she does not feel well, she reports some nausea, she is more tender in the right low back and buttock, some tenderness int eh right ITB.  wallking in she is slouched and shuffling her feet.  She rpeorts feeling very tired and yucky    PT Next Visit Plan  see if patient feels better    Consulted and Agree with Plan of Care  Patient       Patient will benefit  from skilled therapeutic intervention in order to improve the following deficits and impairments:  Difficulty walking, Decreased range of motion, Increased muscle spasms, Pain, Improper body mechanics, Decreased strength, Decreased mobility  Visit Diagnosis: Difficulty in walking, not elsewhere classified  Chronic bilateral low back pain without sciatica  Unsteady gait     Problem List Patient Active Problem List   Diagnosis Date Noted  . OA (osteoarthritis) of knee 12/26/2015  . Essential hypertension 11/24/2015  . Hyperlipidemia 11/24/2015  . Rheumatoid arthritis (HCC) 11/24/2015  . Lumbar spondylosis 07/21/2014    Jearld Lesch., PT 01/09/2018, 5:40 PM  [facility] [place] [address] [place], [place], [address] Phone: [phone]   Fax:  [phone]  Name: Joy [name] MRN: [id number] Date of Birth: Dec 31, 1940

## 2018-01-11 ENCOUNTER — Emergency Department (HOSPITAL_BASED_OUTPATIENT_CLINIC_OR_DEPARTMENT_OTHER): Payer: Medicare Other

## 2018-01-11 ENCOUNTER — Encounter (HOSPITAL_BASED_OUTPATIENT_CLINIC_OR_DEPARTMENT_OTHER): Payer: Self-pay | Admitting: Emergency Medicine

## 2018-01-11 ENCOUNTER — Emergency Department (HOSPITAL_BASED_OUTPATIENT_CLINIC_OR_DEPARTMENT_OTHER)
Admission: EM | Admit: 2018-01-11 | Discharge: 2018-01-11 | Disposition: A | Discharge status: Home / Self Care | Payer: Medicare Other | Attending: Emergency Medicine | Admitting: Emergency Medicine

## 2018-01-11 ENCOUNTER — Other Ambulatory Visit: Payer: Self-pay

## 2018-01-11 DIAGNOSIS — Z91040 Latex allergy status: Secondary | ICD-10-CM | POA: Diagnosis not present

## 2018-01-11 DIAGNOSIS — J209 Acute bronchitis, unspecified: Secondary | ICD-10-CM | POA: Diagnosis not present

## 2018-01-11 DIAGNOSIS — Z87891 Personal history of nicotine dependence: Secondary | ICD-10-CM | POA: Insufficient documentation

## 2018-01-11 DIAGNOSIS — Z7901 Long term (current) use of anticoagulants: Secondary | ICD-10-CM | POA: Insufficient documentation

## 2018-01-11 DIAGNOSIS — R0602 Shortness of breath: Secondary | ICD-10-CM | POA: Diagnosis not present

## 2018-01-11 DIAGNOSIS — R05 Cough: Secondary | ICD-10-CM | POA: Diagnosis not present

## 2018-01-11 DIAGNOSIS — I10 Essential (primary) hypertension: Secondary | ICD-10-CM | POA: Diagnosis not present

## 2018-01-11 DIAGNOSIS — Z79899 Other long term (current) drug therapy: Secondary | ICD-10-CM | POA: Diagnosis not present

## 2018-01-11 MED ORDER — PREDNISONE 20 MG PO TABS
30.0000 mg | ORAL_TABLET | Freq: Once | ORAL | Status: AC
  Administered 2018-01-11: 30 mg via ORAL
  Filled 2018-01-11: qty 1

## 2018-01-11 MED ORDER — BENZONATATE 100 MG PO CAPS: 100.0000 mg | ORAL_CAPSULE | Freq: Three times a day (TID) | ORAL | 0 refills | Status: DC | PRN

## 2018-01-11 MED ORDER — DOXYCYCLINE HYCLATE 100 MG PO CAPS: 100.0000 mg | ORAL_CAPSULE | Freq: Two times a day (BID) | ORAL | 0 refills | Status: DC

## 2018-01-11 MED ORDER — DOXYCYCLINE HYCLATE 100 MG PO TABS
100.0000 mg | ORAL_TABLET | Freq: Once | ORAL | Status: AC
  Administered 2018-01-11: 100 mg via ORAL
  Filled 2018-01-11: qty 1

## 2018-01-11 MED ORDER — PREDNISONE 10 MG PO TABS: 30.0000 mg | ORAL_TABLET | Freq: Every day | ORAL | 0 refills | Status: DC

## 2018-01-11 NOTE — ED Notes (Signed)
ED Provider at bedside. 

## 2018-01-11 NOTE — ED Triage Notes (Signed)
Cough and nasal congestion, low grade fever, body aches x 3 days.

## 2018-01-11 NOTE — ED Provider Notes (Signed)
[facility] EMERGENCY DEPARTMENT Provider Note   CSN: [id number] Arrival date & time: 01/11/18  1124     History   Chief Complaint Chief Complaint  Patient presents with  . Cough    HPI Joy [name] is a 77 y.o. female.  The history is provided by the patient. No language interpreter was used.   Joy [name] is a 77 y.o. female who presents to the Emergency Department complaining of cough. He presents to the emergency department accompanied by her daughter for evaluation of cough for the last three days. She has chest congestion and cough productive of sputum. She states it is occasionally [place] and is concerned it might turn green soon. She had a temperature to 100.5 today at home. She has been experiencing generalized body aches and had one episode of emesis yesterday. She has a history of COPD as well as rheumatoid arthritis and ankylosing spondylosis. She is on chronic immunosuppression with methotrexate and Q6 week Remicade infusions. Symptoms are moderate and constant in nature.  Past Medical History:  Diagnosis Date  . Arthritis    RHEUMATOID  . Bronchitis   . CTS (carpal tunnel syndrome)   . Dysrhythmia    "irregularity" unknown at this time - being evaluated by cardiology  . Essential hypertension 11/24/2015  . GERD (gastroesophageal reflux disease)   . Hypercholesteremia   . Hyperlipidemia 11/24/2015  . Hypertension   . IBS (irritable bowel syndrome)   . OAB (overactive bladder)   . Osteoarthritis   . Osteoporosis   . RA (rheumatoid arthritis) (HCC)   . Rheumatoid arthritis (HCC) 11/24/2015  . Seasonal allergies     Patient Active Problem List   Diagnosis Date Noted  . OA (osteoarthritis) of knee 12/26/2015  . Essential hypertension 11/24/2015  . Hyperlipidemia 11/24/2015  . Rheumatoid arthritis (HCC) 11/24/2015  . Lumbar spondylosis 07/21/2014    Past Surgical History:  Procedure Laterality Date  . ABDOMINAL HYSTERECTOMY  1995  . BACK  SURGERY    . BREAST SURGERY     REDUCTION  . CATARACT EXTRACTION W/PHACO  08/01/2012   Procedure: CATARACT EXTRACTION PHACO AND INTRAOCULAR LENS PLACEMENT (IOC);  Surgeon: Chalmers Guest, MD;  Location: [facility] OR;  Service: Ophthalmology;  Laterality: Right;  . HERNIA REPAIR     RIGHT ING.  . JOINT REPLACEMENT  2014   rt total knee  . TONSILLECTOMY    . 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 Authorizing Provider  acetaminophen (TYLENOL) 325 MG tablet Take 650 mg by mouth every 6 (six) hours as needed (For pain.).    [provider]  atorvastatin (LIPITOR) 20 MG tablet Take 20 mg by mouth daily.    [provider]  benzonatate (TESSALON) 100 MG capsule Take 1 capsule (100 mg total) by mouth 3 (three) times daily as needed for cough. 01/11/18   Tilden Fossa, MD  cycloSPORINE (RESTASIS) 0.05 % ophthalmic emulsion Place 1 drop into both eyes 2 (two) times daily as needed (For dry eyes.).    [provider]  doxycycline (VIBRAMYCIN) 100 MG capsule Take 1 capsule (100 mg total) by mouth 2 (two) times daily. 01/11/18   Tilden Fossa, MD  folic acid (  FOLVITE) 1 MG tablet Take 2-3 mg by mouth daily.     [provider]  gabapentin (NEURONTIN) 300 MG capsule Take 300 mg by mouth 2 (two) times daily.  10/27/15   [provider]  HYDROmorphone (DILAUDID) 2 MG tablet Take 1-2 tablets (2-4 mg total) by mouth every 4 (four) hours as needed for moderate pain or severe pain. 12/27/15   Perkins, Alexzandrew L, PA-C  methocarbamol (ROBAXIN) 500 MG tablet Take 1 tablet (500 mg total) by mouth every 6 (six) hours as needed for muscle spasms. 12/27/15   Perkins, Alexzandrew L, PA-C  montelukast (SINGULAIR) 10 MG tablet Take  10 mg by mouth at bedtime as needed (allergies).     [provider]  pantoprazole (PROTONIX) 40 MG tablet Take 40 mg by mouth daily.    [provider]  predniSONE (DELTASONE) 10 MG tablet Take 3 tablets (30 mg total) by mouth daily. 01/11/18   Tilden Fossa, MD  rivaroxaban (XARELTO) 10 MG TABS tablet Take 1 tablet (10 mg total) by mouth daily with breakfast. Take Xarelto for two and a half more weeks, then discontinue Xarelto. Once the patient has completed the blood thinner regimen, then take a Baby 81 mg Aspirin daily for three more weeks. 12/27/15   Perkins, Alexzandrew L, PA-C  traMADol (ULTRAM) 50 MG tablet Take 1-2 tablets (50-100 mg total) by mouth every 6 (six) hours as needed (mild pain). 12/27/15   Perkins, Alexzandrew L, PA-C  valsartan (DIOVAN) 320 MG tablet Take 320 mg by mouth daily.    [provider]    Family History Family History  Problem Relation Age of Onset  . Heart disease Mother   . Cerebral aneurysm Mother   . Heart attack Father   . Heart disease Father   . Hypertension Sister   . Arthritis Sister   . Stroke Sister   . Hypertension Sister   . Arthritis Sister   . Hypertension Sister   . Arthritis Sister   . Hypertension Sister   . Hypertension Sister     Social History Social History   Tobacco Use  . Smoking status: Former Smoker    Packs/day: 1.00    Years: 35.00    Pack years: 35.00    Last attempt to quit: 05/29/1995    Years since quitting: 22.6  . Smokeless tobacco: Never Used  Substance Use Topics  . Alcohol use: Yes    Comment: rare  . Drug use: No     Allergies   Codeine; Other; Latex; and Tape   Review of Systems Review of Systems  All other systems reviewed and are negative.    Physical Exam Updated Vital Signs BP (!) 148/63 (BP Location: Left Arm)   Pulse 82   Temp 98.8 F (37.1 C) (Oral)   Resp 16   Ht 5\' 2"  (1.575 m)   Wt 90.7 kg (200 lb)   SpO2 98%   BMI 36.58 kg/m   Physical Exam    Constitutional: She is oriented to person, place, and time. She appears well-developed and well-nourished.  HENT:  Head: Normocephalic and atraumatic.  Cardiovascular: Normal rate and regular rhythm.  No murmur heard. Pulmonary/Chest: Effort normal and breath sounds normal. No respiratory distress.  Abdominal: Soft. There is no tenderness. There is no rebound and no guarding.  Musculoskeletal: She exhibits no edema or tenderness.  Neurological: She is alert and oriented to person, place, and time.  Skin: Skin is warm and dry.  Psychiatric: She  has a normal mood and affect. Her behavior is normal.  Nursing note and vitals reviewed.    ED Treatments / Results  Labs (all labs ordered are listed, but only abnormal results are displayed) Labs Reviewed - No data to display  EKG None  Radiology Dg Chest 2 View  Result Date: 01/11/2018 CLINICAL DATA:  Productive cough, fever and shortness of breath for the past 3-4 days. EXAM: CHEST - 2 VIEW COMPARISON:  07/26/2017. FINDINGS: Normal sized heart. Clear lungs with normal vascularity. The lungs are mildly hyperexpanded with minimal diffuse peribronchial thickening. Small hiatal hernia. Lumbar spine fixation hardware. IMPRESSION: 1. Mild changes of COPD and chronic bronchitis. 2. Small hiatal hernia. 3. No acute abnormality. Electronically Signed   By: Beckie Salts M.D.   On: 01/11/2018 11:59    Procedures Procedures (including critical care time)  Medications Ordered in ED Medications  doxycycline (VIBRA-TABS) tablet 100 mg (has no administration in time range)  predniSONE (DELTASONE) tablet 30 mg (has no administration in time range)     Initial Impression / Assessment and Plan / ED Course  I have reviewed the triage vital signs and the nursing notes.  Pertinent labs & imaging results that were available during my care of the patient were reviewed by me and considered in my medical decision making (see chart for details).      Patient with history of COPD and rheumatoid arthritis here for evaluation of cough and fever to 100.5 at home. She is non-toxic appearing on examination with no respiratory distress. No evidence of acute COPD exacerbation at this time. Presentation is not consistent with sepsis, CHF, PE. Chest x-ray without evidence of infiltrate. Given reports of fever at home as well as relative immunosuppression concern for bacterial bronchitis versus developing infection. Will treat with course of antibiotics as well as short course of steroids. Counseled patient and family on home care as well as close return precautions.  Final Clinical Impressions(s) / ED Diagnoses   Final diagnoses:  Acute bronchitis, unspecified organism    ED Discharge Orders        Ordered    predniSONE (DELTASONE) 10 MG tablet  Daily     01/11/18 1423    doxycycline (VIBRAMYCIN) 100 MG capsule  2 times daily     01/11/18 1423    benzonatate (TESSALON) 100 MG capsule  3 times daily PRN     01/11/18 1424       Tilden Fossa, MD 01/11/18 1436

## 2018-01-15 ENCOUNTER — Ambulatory Visit: Payer: Medicare Other | Admitting: Physical Therapy

## 2018-01-17 ENCOUNTER — Ambulatory Visit: Payer: Medicare Other | Admitting: Physical Therapy

## 2018-01-17 ENCOUNTER — Encounter: Payer: Self-pay | Admitting: Physical Therapy

## 2018-01-17 DIAGNOSIS — M545 Low back pain, unspecified: Secondary | ICD-10-CM

## 2018-01-17 DIAGNOSIS — R262 Difficulty in walking, not elsewhere classified: Secondary | ICD-10-CM | POA: Diagnosis not present

## 2018-01-17 DIAGNOSIS — R2681 Unsteadiness on feet: Secondary | ICD-10-CM

## 2018-01-17 DIAGNOSIS — M25562 Pain in left knee: Secondary | ICD-10-CM | POA: Diagnosis not present

## 2018-01-17 DIAGNOSIS — G8929 Other chronic pain: Secondary | ICD-10-CM

## 2018-01-17 DIAGNOSIS — M25662 Stiffness of left knee, not elsewhere classified: Secondary | ICD-10-CM | POA: Diagnosis not present

## 2018-01-17 NOTE — Therapy (Signed)
[place] [address] [place], [place], [address] Phone: [phone]   Fax:  [phone]  Physical Therapy Treatment  Patient Details  Name: Joy [name] MRN: [id number] Date of Birth: 1940-09-09 Referring Provider: Edwin Dada   Encounter Date: 01/17/2018  PT End of Session - 01/17/18 0917    Visit Number  6    Date for PT Re-Evaluation  02/24/18    PT Start Time  0845    PT Stop Time  0935    PT Time Calculation (min)  50 min       Past Medical History:  Diagnosis Date  . Arthritis    RHEUMATOID  . Bronchitis   . CTS (carpal tunnel syndrome)   . Dysrhythmia    "irregularity" unknown at this time - being evaluated by cardiology  . Essential hypertension 11/24/2015  . GERD (gastroesophageal reflux disease)   . Hypercholesteremia   . Hyperlipidemia 11/24/2015  . Hypertension   . IBS (irritable bowel syndrome)   . OAB (overactive bladder)   . Osteoarthritis   . Osteoporosis   . RA (rheumatoid arthritis) ([place])   . Rheumatoid arthritis ([place]) 11/24/2015  . Seasonal allergies     Past Surgical History:  Procedure Laterality Date  . ABDOMINAL HYSTERECTOMY  1995  . BACK SURGERY    . BREAST SURGERY     REDUCTION  . CATARACT EXTRACTION W/PHACO  08/01/2012   Procedure: CATARACT EXTRACTION PHACO AND INTRAOCULAR LENS PLACEMENT (IOC);  Surgeon: Marylynn Pearson, MD;  Location: [facility];  Service: Ophthalmology;  Laterality: Right;  . HERNIA REPAIR     RIGHT ING.  . JOINT REPLACEMENT  2014   rt total knee  . TONSILLECTOMY    . 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  Subjective Assessment - 01/17/18 0844    Subjective  went to ER with bronchitis,feeling better    Currently in Pain?  Yes    Pain Score  4     Pain Location  Leg                        [facility] Adult PT Treatment/Exercise - 01/17/18 0001      Knee/Hip Exercises: Aerobic   Nustep  L 4 6 min      Knee/Hip Exercises: Machines for Strengthening   Cybex Knee Extension  5# 2x10    Cybex Knee Flexion  20# 2x10      Knee/Hip Exercises: Standing   Other Standing Knee Exercises  3# marching fwd,back and SW. plus stepping fwd and SW over foam roll 10 each      Knee/Hip Exercises: Supine   Other Supine Knee/Hip Exercises  feet on ball K2C, trunk rotation , small bridges and then abdominal isometrics      Moist Heat Therapy   Number Minutes Moist Heat  15 Minutes    Moist Heat Location  Lumbar Spine      Electrical Stimulation   Electrical Stimulation Location  lumbar area    Electrical Stimulation Action  IFC    Electrical Stimulation Parameters  supine    Electrical Stimulation Goals  Pain      Manual Therapy   Manual therapy comments  PROM to the LE's and trunk  PT Short Term Goals - 01/08/18 1350      PT SHORT TERM GOAL #1   Title  Independent with initial HEP     Status  Achieved        PT Long Term Goals - 01/09/18 1739      PT LONG TERM GOAL #1   Title  Independent with advanced HEP to include balance    Status  On-going      PT LONG TERM GOAL #2   Title  patient will report 50% decrease fear of falls    Status  On-going      PT LONG TERM GOAL #4   Title  decrease TUG time to 14 seconds    Status  On-going      PT LONG TERM GOAL #5   Title  decrease pain 25%    Status  On-going            Plan - 01/17/18 0918    Clinical Impression Statement  pt feeling better tahn last session but increased SOB with wheezing so progressed strengthening slowly and she tolerated well. HHA needed with balance ex for stability.no gaols met this week with set back of broncitis    PT Treatment/Interventions  ADLs/Self Care Home Management;Cryotherapy;Electrical Stimulation;Moist Heat;Balance  training;Neuromuscular re-education;Therapeutic exercise;Therapeutic activities;Functional mobility training;Stair training;Patient/family education;Manual techniques    PT Next Visit Plan  progress with core and LE strength and balance       Patient will benefit from skilled therapeutic intervention in order to improve the following deficits and impairments:  Difficulty walking, Decreased range of motion, Increased muscle spasms, Pain, Improper body mechanics, Decreased strength, Decreased mobility  Visit Diagnosis: Difficulty in walking, not elsewhere classified  Chronic bilateral low back pain without sciatica  Unsteady gait     Problem List Patient Active Problem List   Diagnosis Date Noted  . OA (osteoarthritis) of knee 12/26/2015  . Essential hypertension 11/24/2015  . Hyperlipidemia 11/24/2015  . Rheumatoid arthritis ([place]) 11/24/2015  . Lumbar spondylosis 07/21/2014    Syler Norcia,ANGIE PTA 01/17/2018, 9:20 AM  [address] [place], [place], [address] Phone: [phone]   Fax:  [phone]  Name: Joy [name] MRN: [id number] Date of Birth: 1941/01/14

## 2018-01-22 ENCOUNTER — Encounter: Payer: Self-pay | Admitting: Physical Therapy

## 2018-01-22 ENCOUNTER — Ambulatory Visit: Payer: Medicare Other | Attending: Family Medicine | Admitting: Physical Therapy

## 2018-01-22 DIAGNOSIS — M545 Low back pain: Secondary | ICD-10-CM | POA: Diagnosis not present

## 2018-01-22 DIAGNOSIS — M25662 Stiffness of left knee, not elsewhere classified: Secondary | ICD-10-CM | POA: Diagnosis not present

## 2018-01-22 DIAGNOSIS — R2681 Unsteadiness on feet: Secondary | ICD-10-CM | POA: Diagnosis not present

## 2018-01-22 DIAGNOSIS — R262 Difficulty in walking, not elsewhere classified: Secondary | ICD-10-CM | POA: Diagnosis not present

## 2018-01-22 DIAGNOSIS — M25562 Pain in left knee: Secondary | ICD-10-CM | POA: Diagnosis not present

## 2018-01-22 DIAGNOSIS — G8929 Other chronic pain: Secondary | ICD-10-CM | POA: Diagnosis not present

## 2018-01-22 NOTE — Therapy (Signed)
[place], [place], [address] Phone: [phone]   Fax:  [phone]  Physical Therapy Treatment  Patient Details  Name: Joy [name] MRN: [id number] Date of Birth: [birth year] Referring Provider: Edwin Dada   Encounter Date: 01/22/2018  PT End of Session - 01/22/18 1345    Visit Number  7    Date for PT Re-Evaluation  02/24/18    PT Start Time  2334    PT Stop Time  1401    PT Time Calculation (min)  47 min       Past Medical History:  Diagnosis Date  . Arthritis    RHEUMATOID  . Bronchitis   . CTS (carpal tunnel syndrome)   . Dysrhythmia    "irregularity" unknown at this time - being evaluated by cardiology  . Essential hypertension 11/24/2015  . GERD (gastroesophageal reflux disease)   . Hypercholesteremia   . Hyperlipidemia 11/24/2015  . Hypertension   . IBS (irritable bowel syndrome)   . OAB (overactive bladder)   . Osteoarthritis   . Osteoporosis   . RA (rheumatoid arthritis) ([place])   . Rheumatoid arthritis ([place]) 11/24/2015  . Seasonal allergies     Past Surgical History:  Procedure Laterality Date  . ABDOMINAL HYSTERECTOMY  1995  . BACK SURGERY    . BREAST SURGERY     REDUCTION  . CATARACT EXTRACTION W/PHACO  08/01/2012   Procedure: CATARACT EXTRACTION PHACO AND INTRAOCULAR LENS PLACEMENT (IOC);  Surgeon: Marylynn Pearson, MD;  Location: [facility];  Service: Ophthalmology;  Laterality: Right;  . HERNIA REPAIR     RIGHT ING.  . JOINT REPLACEMENT  2014   rt total knee  . TONSILLECTOMY    . 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  Subjective Assessment - 01/22/18 1316    Subjective  Patient reports that overall she is still feeling bad, she reports that she is going to see her regular MD and an orthopod.       Currently in Pain?  Yes    Pain Score  2     Pain Location  Leg    Pain Orientation  Right    Aggravating Factors   walking, standing                       [facility] Adult PT Treatment/Exercise - 01/22/18 0001      High Level Balance   High Level Balance Activities  Side stepping;Backward walking;Tandem walking;Negotitating around obstacles;Negotiating over obstacles    High Level Balance Comments  on airex ball toss, eyes closed, head turns, resisted gait, toe walk, heel walk,       Knee/Hip Exercises: Stretches   Passive Hamstring Stretch  4 reps;20 seconds    Quad Stretch  3 reps;20 seconds    ITB Stretch  3 reps;20 seconds    Piriformis Stretch  4 reps;20 seconds      Knee/Hip Exercises: Aerobic   Nustep  L 4 6 min      Knee/Hip Exercises: Machines for Strengthening   Cybex Knee Extension  5# 2x10    Cybex Knee Flexion  20# 2x10      Knee/Hip Exercises: Supine   Other Supine Knee/Hip Exercises  feet on ball K2C, trunk rotation , small  bridges and then abdominal isometrics      Moist Heat Therapy   Number Minutes Moist Heat  15 Minutes    Moist Heat Location  Lumbar Spine      Electrical Stimulation   Electrical Stimulation Location  lumbar area    Electrical Stimulation Action  IFC    Electrical Stimulation Parameters  supine    Electrical Stimulation Goals  Pain             PT Education - 01/22/18 1345    Education provided  Yes    Education Details  Went over Wms flexion exercises and stretching, she reports that she had not been doing any exercises    Person(s) Educated  Patient    Methods  Explanation;Demonstration;Tactile cues;Verbal cues;Handout    Comprehension  Verbalized understanding       PT Short Term Goals - 01/08/18 1350      PT SHORT TERM GOAL #1   Title  Independent with initial HEP     Status  Achieved        PT Long Term Goals - 01/22/18 1348      PT LONG TERM GOAL #1   Title  Independent with advanced HEP to  include balance    Status  Partially Met      PT LONG TERM GOAL #2   Title  patient will report 50% decrease fear of falls    Status  Partially Met      PT LONG TERM GOAL #3   Title  increase Berg score to 52/56    Status  On-going      PT LONG TERM GOAL #4   Title  decrease TUG time to 14 seconds    Status  Partially Met      PT LONG TERM GOAL #5   Title  decrease pain 25%    Status  Partially Met            Plan - 01/22/18 1346    Clinical Impression Statement  Patient reports that overall she does not feel good, she is not talking about pain in the back or hip.  Just a general "feeling bad" was seen by ED about 2 weeks ago for bronchitis.  She has difficulty on the Airex.    PT Next Visit Plan  patient reports that she wants to be on hold as she wants to go to MD's about not feeling well    Consulted and Agree with Plan of Care  Patient       Patient will benefit from skilled therapeutic intervention in order to improve the following deficits and impairments:  Difficulty walking, Decreased range of motion, Increased muscle spasms, Pain, Improper body mechanics, Decreased strength, Decreased mobility  Visit Diagnosis: Difficulty in walking, not elsewhere classified  Chronic bilateral low back pain without sciatica  Unsteady gait  Stiffness of left knee, not elsewhere classified  Acute pain of left knee     Problem List Patient Active Problem List   Diagnosis Date Noted  . OA (osteoarthritis) of knee 12/26/2015  . Essential hypertension 11/24/2015  . Hyperlipidemia 11/24/2015  . Rheumatoid arthritis ([place]) 11/24/2015  . Lumbar spondylosis 07/21/2014    Sumner Boast., PT 01/22/2018, 1:49 PM  [address] [place], [place], [address] Phone: [phone]   Fax:  [phone]  Name: Joy [name] MRN: [id number] Date of Birth: January 19, 1941

## 2018-01-24 DIAGNOSIS — J209 Acute bronchitis, unspecified: Secondary | ICD-10-CM | POA: Diagnosis not present

## 2018-02-05 DIAGNOSIS — Z79899 Other long term (current) drug therapy: Secondary | ICD-10-CM | POA: Diagnosis not present

## 2018-02-05 DIAGNOSIS — M0589 Other rheumatoid arthritis with rheumatoid factor of multiple sites: Secondary | ICD-10-CM | POA: Diagnosis not present

## 2018-02-13 DIAGNOSIS — R5383 Other fatigue: Secondary | ICD-10-CM | POA: Diagnosis not present

## 2018-02-13 DIAGNOSIS — Z79899 Other long term (current) drug therapy: Secondary | ICD-10-CM | POA: Diagnosis not present

## 2018-02-13 DIAGNOSIS — R05 Cough: Secondary | ICD-10-CM | POA: Diagnosis not present

## 2018-02-13 DIAGNOSIS — E669 Obesity, unspecified: Secondary | ICD-10-CM | POA: Diagnosis not present

## 2018-02-13 DIAGNOSIS — Z6838 Body mass index (BMI) 38.0-38.9, adult: Secondary | ICD-10-CM | POA: Diagnosis not present

## 2018-02-13 DIAGNOSIS — M2550 Pain in unspecified joint: Secondary | ICD-10-CM | POA: Diagnosis not present

## 2018-02-13 DIAGNOSIS — M150 Primary generalized (osteo)arthritis: Secondary | ICD-10-CM | POA: Diagnosis not present

## 2018-02-13 DIAGNOSIS — M0589 Other rheumatoid arthritis with rheumatoid factor of multiple sites: Secondary | ICD-10-CM | POA: Diagnosis not present

## 2018-02-13 DIAGNOSIS — K5900 Constipation, unspecified: Secondary | ICD-10-CM | POA: Diagnosis not present

## 2018-02-13 DIAGNOSIS — Z1589 Genetic susceptibility to other disease: Secondary | ICD-10-CM | POA: Diagnosis not present

## 2018-03-07 DIAGNOSIS — J302 Other seasonal allergic rhinitis: Secondary | ICD-10-CM | POA: Diagnosis not present

## 2018-03-12 DIAGNOSIS — H2512 Age-related nuclear cataract, left eye: Secondary | ICD-10-CM | POA: Diagnosis not present

## 2018-03-19 DIAGNOSIS — M0589 Other rheumatoid arthritis with rheumatoid factor of multiple sites: Secondary | ICD-10-CM | POA: Diagnosis not present

## 2018-03-20 DIAGNOSIS — M170 Bilateral primary osteoarthritis of knee: Secondary | ICD-10-CM | POA: Diagnosis not present

## 2018-03-31 DIAGNOSIS — H25812 Combined forms of age-related cataract, left eye: Secondary | ICD-10-CM | POA: Diagnosis not present

## 2018-03-31 DIAGNOSIS — H25813 Combined forms of age-related cataract, bilateral: Secondary | ICD-10-CM | POA: Diagnosis not present

## 2018-04-01 DIAGNOSIS — H1132 Conjunctival hemorrhage, left eye: Secondary | ICD-10-CM | POA: Diagnosis not present

## 2018-04-01 DIAGNOSIS — H43813 Vitreous degeneration, bilateral: Secondary | ICD-10-CM | POA: Diagnosis not present

## 2018-04-01 DIAGNOSIS — Z961 Presence of intraocular lens: Secondary | ICD-10-CM | POA: Diagnosis not present

## 2018-04-01 DIAGNOSIS — H16223 Keratoconjunctivitis sicca, not specified as Sjogren's, bilateral: Secondary | ICD-10-CM | POA: Diagnosis not present

## 2018-04-01 DIAGNOSIS — H2512 Age-related nuclear cataract, left eye: Secondary | ICD-10-CM | POA: Diagnosis not present

## 2018-04-14 DIAGNOSIS — M48062 Spinal stenosis, lumbar region with neurogenic claudication: Secondary | ICD-10-CM | POA: Diagnosis not present

## 2018-04-15 DIAGNOSIS — Z961 Presence of intraocular lens: Secondary | ICD-10-CM | POA: Diagnosis not present

## 2018-04-15 DIAGNOSIS — H5711 Ocular pain, right eye: Secondary | ICD-10-CM | POA: Diagnosis not present

## 2018-04-15 DIAGNOSIS — R51 Headache: Secondary | ICD-10-CM | POA: Diagnosis not present

## 2018-04-16 DIAGNOSIS — M5126 Other intervertebral disc displacement, lumbar region: Secondary | ICD-10-CM | POA: Diagnosis not present

## 2018-04-16 DIAGNOSIS — M48062 Spinal stenosis, lumbar region with neurogenic claudication: Secondary | ICD-10-CM | POA: Diagnosis not present

## 2018-04-16 DIAGNOSIS — M48061 Spinal stenosis, lumbar region without neurogenic claudication: Secondary | ICD-10-CM | POA: Diagnosis not present

## 2018-04-18 ENCOUNTER — Other Ambulatory Visit: Payer: Self-pay | Admitting: Neurosurgery

## 2018-05-05 DIAGNOSIS — I10 Essential (primary) hypertension: Secondary | ICD-10-CM | POA: Diagnosis not present

## 2018-05-05 DIAGNOSIS — Z6837 Body mass index (BMI) 37.0-37.9, adult: Secondary | ICD-10-CM | POA: Diagnosis not present

## 2018-05-05 DIAGNOSIS — M4316 Spondylolisthesis, lumbar region: Secondary | ICD-10-CM | POA: Diagnosis not present

## 2018-05-05 DIAGNOSIS — M48062 Spinal stenosis, lumbar region with neurogenic claudication: Secondary | ICD-10-CM | POA: Diagnosis not present

## 2018-05-07 DIAGNOSIS — M0589 Other rheumatoid arthritis with rheumatoid factor of multiple sites: Secondary | ICD-10-CM | POA: Diagnosis not present

## 2018-05-07 DIAGNOSIS — Z79899 Other long term (current) drug therapy: Secondary | ICD-10-CM | POA: Diagnosis not present

## 2018-05-13 DIAGNOSIS — Z961 Presence of intraocular lens: Secondary | ICD-10-CM | POA: Diagnosis not present

## 2018-05-13 DIAGNOSIS — H16223 Keratoconjunctivitis sicca, not specified as Sjogren's, bilateral: Secondary | ICD-10-CM | POA: Diagnosis not present

## 2018-05-13 DIAGNOSIS — H5213 Myopia, bilateral: Secondary | ICD-10-CM | POA: Diagnosis not present

## 2018-05-13 DIAGNOSIS — H52223 Regular astigmatism, bilateral: Secondary | ICD-10-CM | POA: Diagnosis not present

## 2018-05-17 DIAGNOSIS — Z23 Encounter for immunization: Secondary | ICD-10-CM | POA: Diagnosis not present

## 2018-05-27 DIAGNOSIS — R05 Cough: Secondary | ICD-10-CM | POA: Diagnosis not present

## 2018-06-03 NOTE — Pre-Procedure Instructions (Signed)
DARLYNE SCHMIESING  06/03/2018      [id number] - [place], [place] - [address] AT [facility] [address] [place] [address] Phone: [phone] Fax: [phone]    Your procedure is scheduled on October 25th.  Report to [facility] Admitting at 0700 A.M.  Call this number if you have problems the morning of surgery:  [phone]   Remember:  Do not eat or drink after midnight.    Take these medicines the morning of surgery with A SIP OF WATER   Atorvastatin  Tessalon (if needed)  Protonix  7 days prior to surgery STOP taking any Aspirin(unless otherwise instructed by your surgeon), Aleve, Naproxen, Ibuprofen, Motrin, Advil, Goody's, BC's, all herbal medications, fish oil, and all vitamins      Do not wear jewelry, make-up or nail polish.  Do not wear lotions, powders, or perfumes, or deodorant.  Do not shave 48 hours prior to surgery.  Men may shave face and neck.  Do not bring valuables to the hospital.  [facility] is not responsible for any belongings or valuables.  Contacts, dentures or bridgework may not be worn into surgery.  Leave your suitcase in the car.  After surgery it may be brought to your room.  For patients admitted to the hospital, discharge time will be determined by your treatment team.  Patients discharged the day of surgery will not be allowed to drive home.    [facility]- Preparing For Surgery  Before surgery, you can play an important role. Because skin is not sterile, your skin needs to be as free of germs as possible. You can reduce the number of germs on your skin by washing with CHG (chlorahexidine gluconate) Soap before surgery.  CHG is an antiseptic cleaner which kills germs and bonds with the skin to continue killing germs even after washing.    Oral Hygiene is also important to reduce your risk of infection.  Remember - BRUSH YOUR TEETH THE MORNING OF SURGERY WITH YOUR REGULAR TOOTHPASTE  Please  do not use if you have an allergy to CHG or antibacterial soaps. If your skin becomes reddened/irritated stop using the CHG.  Do not shave (including legs and underarms) for at least 48 hours prior to first CHG shower. It is OK to shave your face.  Please follow these instructions carefully.   1. Shower the NIGHT BEFORE SURGERY and the MORNING OF SURGERY with CHG.   2. If you chose to wash your hair, wash your hair first as usual with your normal shampoo.  3. After you shampoo, rinse your hair and body thoroughly to remove the shampoo.  4. Use CHG as you would any other liquid soap. You can apply CHG directly to the skin and wash gently with a scrungie or a clean washcloth.   5. Apply the CHG Soap to your body ONLY FROM THE NECK DOWN.  Do not use on open wounds or open sores. Avoid contact with your eyes, ears, mouth and genitals (private parts). Wash Face and genitals (private parts)  with your normal soap.  6. Wash thoroughly, paying special attention to the area where your surgery will be performed.  7. Thoroughly rinse your body with warm water from the neck down.  8. DO NOT shower/wash with your normal soap after using and rinsing off the CHG Soap.  9. Pat yourself dry with a CLEAN TOWEL.  10. Wear CLEAN PAJAMAS to  bed the night before surgery, wear comfortable clothes the morning of surgery  11. Place CLEAN SHEETS on your bed the night of your first shower and DO NOT SLEEP WITH PETS.    Day of Surgery:  Do not apply any deodorants/lotions.  Please wear clean clothes to the hospital/surgery center.   Remember to brush your teeth WITH YOUR REGULAR TOOTHPASTE.    Please read over the following fact sheets that you were given.

## 2018-06-04 ENCOUNTER — Encounter (HOSPITAL_COMMUNITY)
Admission: RE | Admit: 2018-06-04 | Discharge: 2018-06-04 | Disposition: A | Discharge status: Home / Self Care | Payer: Medicare Other | Source: Ambulatory Visit | Attending: Neurosurgery | Admitting: Neurosurgery

## 2018-06-04 ENCOUNTER — Encounter (HOSPITAL_COMMUNITY): Payer: Self-pay

## 2018-06-04 DIAGNOSIS — I10 Essential (primary) hypertension: Secondary | ICD-10-CM | POA: Diagnosis not present

## 2018-06-04 DIAGNOSIS — J449 Chronic obstructive pulmonary disease, unspecified: Secondary | ICD-10-CM | POA: Insufficient documentation

## 2018-06-04 DIAGNOSIS — K219 Gastro-esophageal reflux disease without esophagitis: Secondary | ICD-10-CM | POA: Insufficient documentation

## 2018-06-04 DIAGNOSIS — Z9889 Other specified postprocedural states: Secondary | ICD-10-CM | POA: Insufficient documentation

## 2018-06-04 DIAGNOSIS — Z01818 Encounter for other preprocedural examination: Secondary | ICD-10-CM | POA: Diagnosis not present

## 2018-06-04 DIAGNOSIS — Z79899 Other long term (current) drug therapy: Secondary | ICD-10-CM | POA: Diagnosis not present

## 2018-06-04 DIAGNOSIS — Z79891 Long term (current) use of opiate analgesic: Secondary | ICD-10-CM | POA: Insufficient documentation

## 2018-06-04 DIAGNOSIS — M48062 Spinal stenosis, lumbar region with neurogenic claudication: Secondary | ICD-10-CM | POA: Insufficient documentation

## 2018-06-04 DIAGNOSIS — R9431 Abnormal electrocardiogram [ECG] [EKG]: Secondary | ICD-10-CM | POA: Insufficient documentation

## 2018-06-04 DIAGNOSIS — Z87891 Personal history of nicotine dependence: Secondary | ICD-10-CM | POA: Insufficient documentation

## 2018-06-04 DIAGNOSIS — Z90710 Acquired absence of both cervix and uterus: Secondary | ICD-10-CM | POA: Diagnosis not present

## 2018-06-04 DIAGNOSIS — E785 Hyperlipidemia, unspecified: Secondary | ICD-10-CM | POA: Insufficient documentation

## 2018-06-04 HISTORY — DX: Chronic obstructive pulmonary disease, unspecified: J44.9

## 2018-06-04 HISTORY — DX: Polyneuropathy, unspecified: G62.9

## 2018-06-04 HISTORY — DX: Ankylosing spondylitis of unspecified sites in spine: M45.9

## 2018-06-04 LAB — CBC
HCT: 40.1 % (ref 36.0–46.0)
Hemoglobin: 12.8 g/dL (ref 12.0–15.0)
MCH: 31 pg (ref 26.0–34.0)
MCHC: 31.9 g/dL (ref 30.0–36.0)
MCV: 97.1 fL (ref 80.0–100.0)
Platelets: 311 10*3/uL (ref 150–400)
RBC: 4.13 MIL/uL (ref 3.87–5.11)
RDW: 14.5 % (ref 11.5–15.5)
WBC: 6.7 10*3/uL (ref 4.0–10.5)
nRBC: 0 % (ref 0.0–0.2)

## 2018-06-04 LAB — SURGICAL PCR SCREEN
MRSA, PCR: NEGATIVE
Staphylococcus aureus: POSITIVE — AB

## 2018-06-04 LAB — BASIC METABOLIC PANEL
Anion gap: 4 — ABNORMAL LOW (ref 5–15)
BUN: 11 mg/dL (ref 8–23)
CALCIUM: 9.8 mg/dL (ref 8.9–10.3)
CO2: 27 mmol/L (ref 22–32)
CREATININE: 0.89 mg/dL (ref 0.44–1.00)
Chloride: 108 mmol/L (ref 98–111)
GFR calc Af Amer: >60 mL/min (ref >60)
GFR calc non Af Amer: >60 mL/min (ref >60)
GLUCOSE: 98 mg/dL (ref 70–99)
Potassium: 4 mmol/L (ref 3.5–5.1)
Sodium: 139 mmol/L (ref 135–145)

## 2018-06-04 NOTE — Progress Notes (Signed)
PCP - Dr. Juluis Rainier Cardiologist - denies  Chest x-ray - 01/11/18; requested recent CXR from [facility] that was reported on 05/27/18. EKG - 06/04/18 Stress Test - 2017 ECHO - denies Cardiac Cath - denies  Sleep Study -denies  Blood Thinner Instructions: N/A Aspirin Instructions: N/A  Anesthesia review: Yes,   Patient denies shortness of breath, fever, and chest pain at PAT appointment  Pt seen by Revonda Standard for progressive cough and hoarseness. Pt recently told by PCP to see a pulmonologist for COPD complications and recurrent bronchitis. Requested last office note and CXR.   Patient verbalized understanding of instructions that were given to them at the PAT appointment. Patient was also instructed that they will need to review over the PAT instructions again at home before surgery.

## 2018-06-04 NOTE — Progress Notes (Signed)
Notified by blood bank pt will need redraw type and screen on DOS.

## 2018-06-04 NOTE — Progress Notes (Addendum)
Anesthesia PAT Evaluation:   Case:  [id number] Date/Time:  06/13/18 0845   Procedure:  Lumbar 2-3 Posterior lumbar interbody fusion (N/A ) - Lumbar 2-3 Posterior lumbar interbody fusion   Anesthesia type:  General   Pre-op diagnosis:  Spinal stenosis, Lumbar region with neurogenic claudication   Location:  [facility] OR ROOM 18 / [facility]   Surgeon:  Coletta Memos, MD    OR Room is booked from 9:00AM - 1:35 PM.  DISCUSSION: Patient is a 77 year old female scheduled for the above procedure.   History includes former smoker (quit '96), HTN, HLD, dysrhythmia ("irregularity"; PACs, PVCs on 11/2015 Holter monitor), RA, ankylosing spondylitis, GERD, neuropathy, COPD (mild by 12/2017 CXR).   She reports an intermittent dry cough (occasional clear sputum) for about the past year which has mostly been attributed to allergies. However, she developed a scratchy throat and productive cough within a few days of receiving her flu vaccine. She had 2 weeks of dark green sputum and was seen at her PCP office by covering physician Gweneth Dimitri, MD 05/27/18 with reportedly normal CXR, but discussion about pulmonology referral which has not happened yet. She was not prescribed any antibiotics. Patient says initially may have had a very low grade fever and coughing spells occasional associated with reflux. Still with dark green sputum, but decrease in amount and frequency over the past few days. Denies sinus pressure, feels like congestion in more in her chest. Possibly mild intermittent wheezes. She does not have an MDI. She does not feel short of breath. She does not feel sick--just with persistent hoarseness, mild feeling of throat tightness, and intermittent productive cough. Tussionex PRN helping some. She denied chest pain, edema, palpitations. She does yoga.  Will follow-up on primary care note and recent CXR, but reviewed available information with anesthesiologist Adonis Huguenin, MD. It patient continues to feel well, be afebrile,  lungs clear, and without wheezing then would anticipate that she can proceed with surgery from an anesthesia standpoint. I have left a voice message for Shanda Bumps at Dr. Sueanne Margarita office to also review symptoms with him. Patient aware that should be develop worsening symptoms then she should be re-evaluated and that surgery would potentially need to be placed on hold.   ADDENDUM: 06/09/18 4:10 PM:  Reviewed 05/27/18 records from Gweneth Dimitri, MD for evaluation of persistent cough. PPI increased and pulmonology referral recommended. CXR negative. Since my evaluation, she was able to be scheduled with pulmonologist Luciano Cutter, MD this morning. Dr. Everardo All wrote:  "Chronic bronchitis Will provide sample Spiriva Respimat 2.15mcg 2 puffs once a day to optimize respiratory status prior to surgery TAKE Prednisone 40 mg daily for five days for mild bronchitis/COPD exacerbation. This should not prevent you from surgery We will order breathing test (full pulmonary function test) after surgery to determine if bronchodilator needs to be continued FOR YOUR RESCUE INHALER, use albuterol every 4-6 hours as needed for shortness of breath or wheezing."  I have updated Jessica at Dr. Sueanne Margarita office. If no acute changes then I would anticipate that she can proceed as planned.   VS: BP (!) 158/74   Pulse 65   Temp 36.8 C   Resp 20   Ht 5\' 2"  (1.575 m)   Wt 90.9 kg   SpO2 99%   BMI 36.65 kg/m   Patient is a pleasant female in NAD. She does not appear ill, but her voice is mildly hoarse/rattly voice. Lungs clear--no wheezes, rhonchi, or crackles. Heart RRR, no  murmur. No pedal edema. Mallampati II. Upper denture. Minimal pharyngeal erythema without exudates. No cervical chain lymphadenopathy noted. Neck mobility without significant restriction.    PROVIDERS: Juluis Rainier, MD is PCP  Zenovia Jordan, MD is rheumatologist. She is not followed by cardiology on a regular basis, but saw Chilton Si, MD in 2017 for preoperative evaluation due to abnormal EKG and palpitations. Occasional PVCs and PACs on Holter with normal stress test.   LABS: Labs reviewed: Acceptable for surgery. (all labs ordered are listed, but only abnormal results are displayed)  Labs Reviewed  BASIC METABOLIC PANEL - Abnormal; Notable for the following components:      Result Value   Anion gap 4 (*)    All other components within normal limits  SURGICAL PCR SCREEN  CBC  TYPE AND SCREEN    IMAGES:  CXR 05/27/18 [facility]): FINDINGS: Normal heart size and mediastinal contours.  No acute infiltrate or edema.  No effusion or pneumothorax.  No acute osseous findings. Impression: Stable, negative chest.   EKG: 06/04/18: NSR, non-specific ST abnormality.   CV: Holter monitor 12/14/15: 24 Hour Holter Monitor Quality: Fair.  Baseline artifact. Average heart rate: 79 bpm Max heart rate: 111 bpm Min heart rate: 60 bpm Pauses >2.5 seconds: 0 Occasional PACs and PVCs   Nuclear stress test 12/02/15:  Normal Lexiscan myoview  Nuclear stress EF: 54%.  There was no ST segment deviation noted during stress.  The study is normal.  This is a low risk study.    Past Medical History:  Diagnosis Date  . Ankylosing spondylitis (HCC)   . Arthritis    RHEUMATOID  . Bronchitis   . COPD (chronic obstructive pulmonary disease) (HCC)    CXR 01/11/18 showed mild COPD and chronic bronchitis  . CTS (carpal tunnel syndrome)   . Dysrhythmia    "irregularity" unknown at this time - being evaluated by cardiology  . Essential hypertension 11/24/2015  . GERD (gastroesophageal reflux disease)   . Hypercholesteremia   . Hyperlipidemia 11/24/2015  . Hypertension   . IBS (irritable bowel syndrome)   . Neuropathy   . OAB (overactive bladder)   . Osteoarthritis   . Osteoporosis   . RA (rheumatoid arthritis) (HCC)   . Rheumatoid arthritis (HCC) 11/24/2015  . Seasonal allergies     Past Surgical  History:  Procedure Laterality Date  . ABDOMINAL HYSTERECTOMY  1995  . BACK SURGERY    . BREAST SURGERY     REDUCTION  . CATARACT EXTRACTION W/PHACO  08/01/2012   Procedure: CATARACT EXTRACTION PHACO AND INTRAOCULAR LENS PLACEMENT (IOC);  Surgeon: Chalmers Guest, MD;  Location: [facility] OR;  Service: Ophthalmology;  Laterality: Right;  . HERNIA REPAIR     RIGHT ING.  . JOINT REPLACEMENT  2014   rt total knee  . TONSILLECTOMY    . TOTAL KNEE ARTHROPLASTY Left 12/26/2015   Procedure: TOTAL KNEE ARTHROPLASTY;  Surgeon: Ollen Gross, MD;  Location: [facility] ORS;  Service: Orthopedics;  Laterality: Left;    MEDICATIONS: . atorvastatin (LIPITOR) 20 MG tablet  . benzonatate (TESSALON) 100 MG capsule  . BIOTIN PO  . diphenhydrAMINE (BENADRYL) 25 MG tablet  . doxycycline (VIBRAMYCIN) 100 MG capsule  . folic acid (FOLVITE) 1 MG tablet  . gabapentin (NEURONTIN) 300 MG capsule  . HYDROmorphone (DILAUDID) 2 MG tablet  . irbesartan (AVAPRO) 300 MG tablet  . Magnesium 250 MG TABS  . methocarbamol (ROBAXIN) 500 MG tablet  . methotrexate (50 MG/ML) 1 g injection  .  naproxen sodium (ALEVE) 220 MG tablet  . OVER THE COUNTER MEDICATION  . pantoprazole (PROTONIX) 40 MG tablet  . Polyethyl Glycol-Propyl Glycol (SYSTANE OP)  . predniSONE (DELTASONE) 10 MG tablet  . rivaroxaban (XARELTO) 10 MG TABS tablet  . traMADol (ULTRAM) 50 MG tablet  . VITAMIN A PO  . vitamin B-12 (CYANOCOBALAMIN) 1000 MCG tablet   No current facility-administered medications for this encounter.   She is NOT TAKING: doxycycline, hydromorphone, methocarbamol, prednisone, rivaroxaban, tramadol. She also takes Remicade Q 6 weeks (help last dose in anticipation for surgery). She typically takes methotrexate on Mondays.    Velna Ochs [facility] Short Stay Center/Anesthesiology Phone [phone] 06/04/2018 4:10 PM

## 2018-06-09 ENCOUNTER — Ambulatory Visit (INDEPENDENT_AMBULATORY_CARE_PROVIDER_SITE_OTHER): Payer: Medicare Other | Admitting: Pulmonary Disease

## 2018-06-09 ENCOUNTER — Encounter: Payer: Self-pay | Admitting: Pulmonary Disease

## 2018-06-09 VITALS — BP 146/82 | HR 72 | Ht 61.5 in | Wt 198.4 lb

## 2018-06-09 DIAGNOSIS — J411 Mucopurulent chronic bronchitis: Secondary | ICD-10-CM | POA: Diagnosis not present

## 2018-06-09 MED ORDER — TIOTROPIUM BROMIDE MONOHYDRATE 2.5 MCG/ACT IN AERS: 2.0000 | INHALATION_SPRAY | Freq: Every day | RESPIRATORY_TRACT | 0 refills | Status: DC

## 2018-06-09 MED ORDER — ALBUTEROL SULFATE HFA 108 (90 BASE) MCG/ACT IN AERS: 2.0000 | INHALATION_SPRAY | Freq: Four times a day (QID) | RESPIRATORY_TRACT | 2 refills | Status: DC | PRN

## 2018-06-09 MED ORDER — PREDNISONE 20 MG PO TABS: 40.0000 mg | ORAL_TABLET | Freq: Every day | ORAL | 0 refills | Status: DC

## 2018-06-09 NOTE — Progress Notes (Signed)
Synopsis: Referred in 05/2018 for chronic bronchitis  Subjective:   PATIENT ID: Joy [name] GENDER: female DOB: 07/24/1941, MRN: [id number]   HPI  Chief Complaint  Patient presents with  . CONSULT    cough for 6 months or longer - always productive but getting worse (dark greenish  brown and thick). For back surgery Friday and wants to know she's good with pulmonary status   [name] is a 77 year old female remote smoker with medical hx significant for RA on methotrexate who presents as new consult for suspected COPD.  Per review of anesthesia note on 06/04/18:  She reported productive cough associated with hoarseness and productive cough and intermittent wheeze. She does not have an MDI. She does not feel short of breath.   She has a longstanding history of bronchitis.For the last 8 months, she has worsening chronic productive cough, shortness of breath. Her sputum has had dark sputum for the last two weeks and associated with dyspnea on exertion. She has tried OTC cough syrup without improvement. Symptoms improve with rest. She has not tried any inhaler therapy. Reports rhinorrhea. Denies fevers, chills, weight loss, night sweats.  She was last treated for presumed COPD exacerbation in May for which she was treated with antibiotics and steroids.    Quit smoking in 1995. Smoked for 30 years x 1ppd. Denies environmental exposures  I have personally reviewed patient's past medical/family/social history, allergies, current medications.  Past Medical History:  Diagnosis Date  . Ankylosing spondylitis (HCC)   . Arthritis    RHEUMATOID  . Bronchitis   . COPD (chronic obstructive pulmonary disease) (HCC)    CXR 01/11/18 showed mild COPD and chronic bronchitis  . CTS (carpal tunnel syndrome)   . Dysrhythmia    "irregularity" unknown at this time - being evaluated by cardiology  . Essential hypertension 11/24/2015  . GERD (gastroesophageal reflux disease)   .  Hypercholesteremia   . Hyperlipidemia 11/24/2015  . Hypertension   . IBS (irritable bowel syndrome)   . Neuropathy   . OAB (overactive bladder)   . Osteoarthritis   . Osteoporosis   . RA (rheumatoid arthritis) (HCC)   . Rheumatoid arthritis (HCC) 11/24/2015  . Seasonal allergies      Family History  Problem Relation Age of Onset  . Heart disease Mother   . Cerebral aneurysm Mother   . Heart attack Father   . Heart disease Father   . Hypertension Sister   . Arthritis Sister   . Stroke Sister   . Hypertension Sister   . Arthritis Sister   . Hypertension Sister   . Arthritis Sister   . Hypertension Sister   . Hypertension Sister      Social History   Socioeconomic History  . Marital status: Divorced    Spouse name: Not on file  . Number of children: Not on file  . Years of education: Not on file  . Highest education level: Not on file  Occupational History  . Not on file  Social Needs  . Financial resource strain: Not on file  . Food insecurity:    Worry: Not on file    Inability: Not on file  . Transportation needs:    Medical: Not on file    Non-medical: Not on file  Tobacco Use  . Smoking status: Former Smoker    Packs/day: 1.00    Years: 35.00    Pack years: 35.00    Last attempt to quit: 05/29/1995  Years since quitting: 23.0  . Smokeless tobacco: Never Used  Substance and Sexual Activity  . Alcohol use: Yes    Comment: rare  . Drug use: No  . Sexual activity: Not on file  Lifestyle  . Physical activity:    Days per week: Not on file    Minutes per session: Not on file  . Stress: Not on file  Relationships  . Social connections:    Talks on phone: Not on file    Gets together: Not on file    Attends religious service: Not on file    Active member of club or organization: Not on file    Attends meetings of clubs or organizations: Not on file    Relationship status: Not on file  . Intimate partner violence:    Fear of current or ex partner: Not  on file    Emotionally abused: Not on file    Physically abused: Not on file    Forced sexual activity: Not on file  Other Topics Concern  . Not on file  Social History Narrative  . Not on file     Allergies  Allergen Reactions  . Codeine Other (See Comments)    Lump in throat  . Latex Rash  . Nickel Rash    Bumps Also other metals  . Tape Hives and Rash    Paper tape only     Outpatient Medications Prior to Visit  Medication Sig Dispense Refill  . atorvastatin (LIPITOR) 20 MG tablet Take 20 mg by mouth daily.    . benzonatate (TESSALON) 100 MG capsule Take 1 capsule (100 mg total) by mouth 3 (three) times daily as needed for cough. 15 capsule 0  . BIOTIN PO Take 800 mcg by mouth daily.    . Cholecalciferol (VITAMIN D3) 1000 units CAPS Take 1,000 Units by mouth daily.    . diphenhydrAMINE (BENADRYL) 25 MG tablet Take 25 mg by mouth every 6 (six) hours as needed for allergies.    . folic acid (FOLVITE) 1 MG tablet Take 1 mg by mouth daily.     Marland Kitchen gabapentin (NEURONTIN) 300 MG capsule Take 300 mg by mouth at bedtime.   1  . inFLIXimab (REMICADE) 100 MG injection Inject 100 mg into the vein.    Marland Kitchen irbesartan (AVAPRO) 300 MG tablet Take 300 mg by mouth daily.    . Magnesium 250 MG TABS Take 250 mg by mouth daily.    . methocarbamol (ROBAXIN) 500 MG tablet Take 1 tablet (500 mg total) by mouth every 6 (six) hours as needed for muscle spasms. 90 tablet 0  . methotrexate (50 MG/ML) 1 g injection Inject 25 mg into the vein every 7 (seven) days.    . naproxen sodium (ALEVE) 220 MG tablet Take 440 mg by mouth daily as needed (pain).    Marland Kitchen OVER THE COUNTER MEDICATION Apply 1 application topically daily as needed (pain).    . pantoprazole (PROTONIX) 40 MG tablet Take 40 mg by mouth daily.    Bertram Gala Glycol-Propyl Glycol (SYSTANE OP) Place 1 drop into both eyes 3 (three) times daily.    Marland Kitchen VITAMIN A PO Take 2,400 Units by mouth daily.    . vitamin B-12 (CYANOCOBALAMIN) 1000 MCG tablet  Take 1,000 mcg by mouth daily.    Marland Kitchen doxycycline (VIBRAMYCIN) 100 MG capsule Take 1 capsule (100 mg total) by mouth 2 (two) times daily. 14 capsule 0  . HYDROmorphone (DILAUDID) 2 MG tablet Take 1-2 tablets (  2-4 mg total) by mouth every 4 (four) hours as needed for moderate pain or severe pain. (Patient not taking: Reported on 06/09/2018) 90 tablet 0  . rivaroxaban (XARELTO) 10 MG TABS tablet Take 1 tablet (10 mg total) by mouth daily with breakfast. Take Xarelto for two and a half more weeks, then discontinue Xarelto. Once the patient has completed the blood thinner regimen, then take a Baby 81 mg Aspirin daily for three more weeks. (Patient not taking: Reported on 06/09/2018) 19 tablet 0  . traMADol (ULTRAM) 50 MG tablet Take 1-2 tablets (50-100 mg total) by mouth every 6 (six) hours as needed (mild pain). (Patient not taking: Reported on 06/09/2018) 90 tablet 1  . predniSONE (DELTASONE) 10 MG tablet Take 3 tablets (30 mg total) by mouth daily. (Patient not taking: Reported on 06/09/2018) 9 tablet 0   No facility-administered medications prior to visit.     Review of Systems  Constitutional: Negative for chills, fever and weight loss.  HENT: Positive for congestion and sore throat.   Respiratory: Positive for cough, sputum production and shortness of breath. Negative for hemoptysis.   Cardiovascular: Positive for palpitations.  Gastrointestinal: Positive for heartburn. Negative for abdominal pain, constipation, diarrhea and nausea.  Genitourinary: Negative for frequency.  Musculoskeletal: Positive for joint pain.  Skin: Negative for rash.  Neurological: Negative for tremors, weakness and headaches.  Endo/Heme/Allergies: Positive for environmental allergies.  Psychiatric/Behavioral: The patient is nervous/anxious.   All other systems reviewed and are negative.     Objective:  Physical Exam   Vitals:   06/09/18 1103  BP: (!) 146/82  Pulse: 72  SpO2: 98%  Weight: 198 lb 6.4 oz (90  kg)  Height: 5' 1.5" (1.562 m)    Gen: well appearing, no acute distress HENT: NCAT, OP clear, neck supple without masses Eyes: PERRL, EOMi Lymph: no cervical lymphadenopathy PULM: CTA B CV: RRR, no mgr, no JVD GI: BS+, soft, nontender, no hsm Derm: no rash or skin breakdown MSK: normal bulk and tone Neuro: A&Ox4, CN II-XII intact, strength 5/5 in all 4 extremities Psyche: normal mood and affect   CBC    Component Value Date/Time   WBC 6.7 06/04/2018 1342   RBC 4.13 06/04/2018 1342   HGB 12.8 06/04/2018 1342   HCT 40.1 06/04/2018 1342   PLT 311 06/04/2018 1342   MCV 97.1 06/04/2018 1342   MCH 31.0 06/04/2018 1342   MCHC 31.9 06/04/2018 1342   RDW 14.5 06/04/2018 1342   Chest imaging: CXR 01/11/18 - No pulmonary edema, effusion or infiltrate  PFT: None on file  I have personally reviewed the above labs, images and tests noted above.    Assessment & Plan:   Mucopurulent chronic bronchitis (HCC) - Plan: Pulmonary function test, albuterol (PROVENTIL HFA;VENTOLIN HFA) 108 (90 Base) MCG/ACT inhaler  Discussion: Mr. Joy [name] is a 77 year old female with medical hx significant for RA on methotrexate who presents with chronic bronchitis vs suspected COPD.  Chronic bronchitis Will provide sample Spiriva Respimat 2.54mcg 2 puffs once a day to optimize respiratory status prior to surgery TAKE Prednisone 40 mg daily for five days for mild bronchitis/COPD exacerbation. This should not prevent you from surgery We will order breathing test (full pulmonary function test) after surgery to determine if bronchodilator needs to be continued FOR YOUR RESCUE INHALER, use albuterol every 4-6 hours as needed for shortness of breath or wheezing.   If you experience worsening shortness of breath or wheezing that does not improve, please  seek medical attention immediately.   Thank you for choosing [facility] for your health needs!   Corian Handley Mechele Collin, MD [facility] 06/09/2018 11:34 AM  Personal pager: [phone] If unanswered, please page [facility]CM On-call: #[phone]    Current Outpatient Medications:  .  atorvastatin (LIPITOR) 20 MG tablet, Take 20 mg by mouth daily., Disp: , Rfl:  .  benzonatate (TESSALON) 100 MG capsule, Take 1 capsule (100 mg total) by mouth 3 (three) times daily as needed for cough., Disp: 15 capsule, Rfl: 0 .  BIOTIN PO, Take 800 mcg by mouth daily., Disp: , Rfl:  .  Cholecalciferol (VITAMIN D3) 1000 units CAPS, Take 1,000 Units by mouth daily., Disp: , Rfl:  .  diphenhydrAMINE (BENADRYL) 25 MG tablet, Take 25 mg by mouth every 6 (six) hours as needed for allergies., Disp: , Rfl:  .  folic acid (FOLVITE) 1 MG tablet, Take 1 mg by mouth daily. , Disp: , Rfl:  .  gabapentin (NEURONTIN) 300 MG capsule, Take 300 mg by mouth at bedtime. , Disp: , Rfl: 1 .  inFLIXimab (REMICADE) 100 MG injection, Inject 100 mg into the vein., Disp: , Rfl:  .  irbesartan (AVAPRO) 300 MG tablet, Take 300 mg by mouth daily., Disp: , Rfl:  .  Magnesium 250 MG TABS, Take 250 mg by mouth daily., Disp: , Rfl:  .  methocarbamol (ROBAXIN) 500 MG tablet, Take 1 tablet (500 mg total) by mouth every 6 (six) hours as needed for muscle spasms., Disp: 90 tablet, Rfl: 0 .  methotrexate (50 MG/ML) 1 g injection, Inject 25 mg into the vein every 7 (seven) days., Disp: , Rfl:  .  naproxen sodium (ALEVE) 220 MG tablet, Take 440 mg by mouth daily as needed (pain)., Disp: , Rfl:  .  OVER THE COUNTER MEDICATION, Apply 1 application topically daily as needed (pain)., Disp: , Rfl:  .  pantoprazole (PROTONIX) 40 MG tablet, Take 40 mg by mouth daily., Disp: , Rfl:  .  Polyethyl Glycol-Propyl Glycol (SYSTANE OP), Place 1 drop into both eyes 3 (three) times daily., Disp: , Rfl:  .  VITAMIN A PO, Take 2,400 Units by mouth daily., Disp: , Rfl:  .  vitamin B-12 (CYANOCOBALAMIN) 1000 MCG tablet, Take 1,000 mcg by mouth daily., Disp: , Rfl:  .  albuterol (PROVENTIL HFA;VENTOLIN  HFA) 108 (90 Base) MCG/ACT inhaler, Inhale 2 puffs into the lungs every 6 (six) hours as needed for wheezing or shortness of breath., Disp: 1 Inhaler, Rfl: 2 .  HYDROmorphone (DILAUDID) 2 MG tablet, Take 1-2 tablets (2-4 mg total) by mouth every 4 (four) hours as needed for moderate pain or severe pain. (Patient not taking: Reported on 06/09/2018), Disp: 90 tablet, Rfl: 0 .  predniSONE (DELTASONE) 20 MG tablet, Take 2 tablets (40 mg total) by mouth daily with breakfast., Disp: 10 tablet, Rfl: 0 .  rivaroxaban (XARELTO) 10 MG TABS tablet, Take 1 tablet (10 mg total) by mouth daily with breakfast. Take Xarelto for two and a half more weeks, then discontinue Xarelto. Once the patient has completed the blood thinner regimen, then take a Baby 81 mg Aspirin daily for three more weeks. (Patient not taking: Reported on 06/09/2018), Disp: 19 tablet, Rfl: 0 .  traMADol (ULTRAM) 50 MG tablet, Take 1-2 tablets (50-100 mg total) by mouth every 6 (six) hours as needed (mild pain). (Patient not taking: Reported on 06/09/2018), Disp: 90 tablet, Rfl: 1

## 2018-06-09 NOTE — Patient Instructions (Addendum)
Chronic bronchitis Will provide sample Spiriva Respimat 2.53mcg 2 puffs once a day to optimize respiratory status prior to surgery TAKE Prednisone 40 mg daily for five days for mild bronchitis/COPD exacerbation. This should not prevent you from surgery We will order breathing test (full pulmonary function test) after surgery to determine if bronchodilator needs to be continued FOR YOUR RESCUE INHALER, use albuterol every 4-6 hours as needed for shortness of breath or wheezing.  If you experience worsening shortness of breath or wheezing that does not improve, please seek medical attention immediately.  Follow-up in 3 months

## 2018-06-09 NOTE — Addendum Note (Signed)
Addended by: Karlton Lemon on: 06/09/2018 11:49 AM   Modules accepted: Orders

## 2018-06-12 DIAGNOSIS — H43813 Vitreous degeneration, bilateral: Secondary | ICD-10-CM | POA: Diagnosis not present

## 2018-06-12 DIAGNOSIS — H16223 Keratoconjunctivitis sicca, not specified as Sjogren's, bilateral: Secondary | ICD-10-CM | POA: Diagnosis not present

## 2018-06-12 DIAGNOSIS — Z961 Presence of intraocular lens: Secondary | ICD-10-CM | POA: Diagnosis not present

## 2018-06-12 NOTE — Anesthesia Preprocedure Evaluation (Addendum)
Anesthesia Evaluation  Patient identified by MRN, date of birth, ID band Patient awake    Reviewed: Allergy & Precautions, NPO status , Patient's Chart, lab work & pertinent test results  Airway Mallampati: II  TM Distance: >3 FB Neck ROM: Full    Dental no notable dental hx. (+) Lower Dentures, Upper Dentures   Pulmonary COPD,  COPD inhaler, former smoker,    Pulmonary exam normal breath sounds clear to auscultation       Cardiovascular hypertension, Pt. on medications Normal cardiovascular exam+ dysrhythmias  Rhythm:Regular Rate:Normal     Neuro/Psych CTS  Neuromuscular disease negative psych ROS   GI/Hepatic Neg liver ROS, GERD  Medicated and Controlled,  Endo/Other  Hyperlipidemia  Renal/GU negative Renal ROS Bladder dysfunction  Overactive bladder    Musculoskeletal  (+) Arthritis , Osteoarthritis and Rheumatoid disorders,  Ankylosing spondylitis Lumbar spinal stenosis with neurogenic claudication   Abdominal (+) + obese,   Peds  Hematology   Anesthesia Other Findings   Reproductive/Obstetrics                            Anesthesia Physical Anesthesia Plan  ASA: III  Anesthesia Plan: General   Post-op Pain Management:    Induction: Intravenous  PONV Risk Score and Plan: 4 or greater and Ondansetron, Dexamethasone and Treatment may vary due to age or medical condition  Airway Management Planned: Oral ETT and Video Laryngoscope Planned  Additional Equipment:   Intra-op Plan:   Post-operative Plan: Extubation in OR  Informed Consent: I have reviewed the patients History and Physical, chart, labs and discussed the procedure including the risks, benefits and alternatives for the proposed anesthesia with the patient or authorized representative who has indicated his/her understanding and acceptance.   Dental advisory given  Plan Discussed with: CRNA and Surgeon  Anesthesia  Plan Comments:        Anesthesia Quick Evaluation

## 2018-06-13 ENCOUNTER — Inpatient Hospital Stay (HOSPITAL_COMMUNITY)
Admission: RE | Disposition: A | Discharge status: Skilled Nursing Facility | Payer: Self-pay | Source: Home / Self Care | Attending: Neurosurgery

## 2018-06-13 ENCOUNTER — Encounter (HOSPITAL_COMMUNITY): Payer: Self-pay | Admitting: Certified Registered"

## 2018-06-13 ENCOUNTER — Inpatient Hospital Stay (HOSPITAL_COMMUNITY)
Admission: RE | Admit: 2018-06-13 | Discharge: 2018-06-19 | DRG: 460 | Disposition: A | Discharge status: Skilled Nursing Facility | Payer: Medicare Other | Attending: Neurosurgery | Admitting: Neurosurgery

## 2018-06-13 ENCOUNTER — Inpatient Hospital Stay (HOSPITAL_COMMUNITY): Payer: Medicare Other

## 2018-06-13 ENCOUNTER — Inpatient Hospital Stay (HOSPITAL_COMMUNITY): Payer: Medicare Other | Admitting: Anesthesiology

## 2018-06-13 ENCOUNTER — Inpatient Hospital Stay (HOSPITAL_COMMUNITY): Payer: Medicare Other | Admitting: Vascular Surgery

## 2018-06-13 DIAGNOSIS — M48062 Spinal stenosis, lumbar region with neurogenic claudication: Secondary | ICD-10-CM | POA: Diagnosis not present

## 2018-06-13 DIAGNOSIS — K219 Gastro-esophageal reflux disease without esophagitis: Secondary | ICD-10-CM | POA: Diagnosis not present

## 2018-06-13 DIAGNOSIS — G9782 Other postprocedural complications and disorders of nervous system: Secondary | ICD-10-CM | POA: Diagnosis not present

## 2018-06-13 DIAGNOSIS — I10 Essential (primary) hypertension: Secondary | ICD-10-CM | POA: Diagnosis not present

## 2018-06-13 DIAGNOSIS — Z91040 Latex allergy status: Secondary | ICD-10-CM

## 2018-06-13 DIAGNOSIS — K589 Irritable bowel syndrome without diarrhea: Secondary | ICD-10-CM | POA: Diagnosis present

## 2018-06-13 DIAGNOSIS — M4316 Spondylolisthesis, lumbar region: Principal | ICD-10-CM | POA: Diagnosis present

## 2018-06-13 DIAGNOSIS — Y658 Other specified misadventures during surgical and medical care: Secondary | ICD-10-CM | POA: Diagnosis not present

## 2018-06-13 DIAGNOSIS — R2681 Unsteadiness on feet: Secondary | ICD-10-CM | POA: Diagnosis not present

## 2018-06-13 DIAGNOSIS — M5136 Other intervertebral disc degeneration, lumbar region: Secondary | ICD-10-CM | POA: Diagnosis not present

## 2018-06-13 DIAGNOSIS — M479 Spondylosis, unspecified: Secondary | ICD-10-CM | POA: Diagnosis present

## 2018-06-13 DIAGNOSIS — Z888 Allergy status to other drugs, medicaments and biological substances status: Secondary | ICD-10-CM

## 2018-06-13 DIAGNOSIS — Z961 Presence of intraocular lens: Secondary | ICD-10-CM | POA: Diagnosis present

## 2018-06-13 DIAGNOSIS — G960 Cerebrospinal fluid leak: Secondary | ICD-10-CM | POA: Diagnosis not present

## 2018-06-13 DIAGNOSIS — M810 Age-related osteoporosis without current pathological fracture: Secondary | ICD-10-CM | POA: Diagnosis present

## 2018-06-13 DIAGNOSIS — J302 Other seasonal allergic rhinitis: Secondary | ICD-10-CM | POA: Diagnosis present

## 2018-06-13 DIAGNOSIS — M79605 Pain in left leg: Secondary | ICD-10-CM | POA: Diagnosis not present

## 2018-06-13 DIAGNOSIS — J449 Chronic obstructive pulmonary disease, unspecified: Secondary | ICD-10-CM | POA: Diagnosis not present

## 2018-06-13 DIAGNOSIS — E7800 Pure hypercholesterolemia, unspecified: Secondary | ICD-10-CM | POA: Diagnosis present

## 2018-06-13 DIAGNOSIS — Z79899 Other long term (current) drug therapy: Secondary | ICD-10-CM

## 2018-06-13 DIAGNOSIS — Z981 Arthrodesis status: Secondary | ICD-10-CM

## 2018-06-13 DIAGNOSIS — Z9841 Cataract extraction status, right eye: Secondary | ICD-10-CM

## 2018-06-13 DIAGNOSIS — E669 Obesity, unspecified: Secondary | ICD-10-CM | POA: Diagnosis present

## 2018-06-13 DIAGNOSIS — Z743 Need for continuous supervision: Secondary | ICD-10-CM | POA: Diagnosis not present

## 2018-06-13 DIAGNOSIS — R278 Other lack of coordination: Secondary | ICD-10-CM | POA: Diagnosis not present

## 2018-06-13 DIAGNOSIS — Z7952 Long term (current) use of systemic steroids: Secondary | ICD-10-CM

## 2018-06-13 DIAGNOSIS — Z4789 Encounter for other orthopedic aftercare: Secondary | ICD-10-CM | POA: Diagnosis not present

## 2018-06-13 DIAGNOSIS — M069 Rheumatoid arthritis, unspecified: Secondary | ICD-10-CM | POA: Diagnosis present

## 2018-06-13 DIAGNOSIS — N3281 Overactive bladder: Secondary | ICD-10-CM | POA: Diagnosis present

## 2018-06-13 DIAGNOSIS — G629 Polyneuropathy, unspecified: Secondary | ICD-10-CM | POA: Diagnosis present

## 2018-06-13 DIAGNOSIS — Z79891 Long term (current) use of opiate analgesic: Secondary | ICD-10-CM

## 2018-06-13 DIAGNOSIS — E785 Hyperlipidemia, unspecified: Secondary | ICD-10-CM | POA: Diagnosis not present

## 2018-06-13 DIAGNOSIS — Z87891 Personal history of nicotine dependence: Secondary | ICD-10-CM

## 2018-06-13 DIAGNOSIS — Y92234 Operating room of hospital as the place of occurrence of the external cause: Secondary | ICD-10-CM | POA: Diagnosis not present

## 2018-06-13 DIAGNOSIS — Z419 Encounter for procedure for purposes other than remedying health state, unspecified: Secondary | ICD-10-CM

## 2018-06-13 DIAGNOSIS — M4326 Fusion of spine, lumbar region: Secondary | ICD-10-CM | POA: Diagnosis not present

## 2018-06-13 DIAGNOSIS — Z8261 Family history of arthritis: Secondary | ICD-10-CM

## 2018-06-13 DIAGNOSIS — Z96653 Presence of artificial knee joint, bilateral: Secondary | ICD-10-CM | POA: Diagnosis present

## 2018-06-13 DIAGNOSIS — Z90710 Acquired absence of both cervix and uterus: Secondary | ICD-10-CM

## 2018-06-13 DIAGNOSIS — M6281 Muscle weakness (generalized): Secondary | ICD-10-CM | POA: Diagnosis not present

## 2018-06-13 DIAGNOSIS — G9741 Accidental puncture or laceration of dura during a procedure: Secondary | ICD-10-CM | POA: Diagnosis not present

## 2018-06-13 DIAGNOSIS — R41841 Cognitive communication deficit: Secondary | ICD-10-CM | POA: Diagnosis not present

## 2018-06-13 DIAGNOSIS — G5600 Carpal tunnel syndrome, unspecified upper limb: Secondary | ICD-10-CM | POA: Diagnosis not present

## 2018-06-13 DIAGNOSIS — Z7901 Long term (current) use of anticoagulants: Secondary | ICD-10-CM

## 2018-06-13 DIAGNOSIS — Z6836 Body mass index (BMI) 36.0-36.9, adult: Secondary | ICD-10-CM

## 2018-06-13 DIAGNOSIS — Z8249 Family history of ischemic heart disease and other diseases of the circulatory system: Secondary | ICD-10-CM

## 2018-06-13 DIAGNOSIS — Z885 Allergy status to narcotic agent status: Secondary | ICD-10-CM

## 2018-06-13 DIAGNOSIS — Z9889 Other specified postprocedural states: Secondary | ICD-10-CM

## 2018-06-13 DIAGNOSIS — R471 Dysarthria and anarthria: Secondary | ICD-10-CM | POA: Diagnosis not present

## 2018-06-13 DIAGNOSIS — Z823 Family history of stroke: Secondary | ICD-10-CM

## 2018-06-13 DIAGNOSIS — R279 Unspecified lack of coordination: Secondary | ICD-10-CM | POA: Diagnosis not present

## 2018-06-13 LAB — TYPE AND SCREEN
ABO/RH(D): A POS
Antibody Screen: NEGATIVE

## 2018-06-13 LAB — POCT I-STAT 4, (NA,K, GLUC, HGB,HCT)
Glucose, Bld: 186 mg/dL — ABNORMAL HIGH (ref 70–99)
HCT: 29 % — ABNORMAL LOW (ref 36.0–46.0)
HEMOGLOBIN: 9.9 g/dL — AB (ref 12.0–15.0)
Potassium: 3.6 mmol/L (ref 3.5–5.1)
Sodium: 138 mmol/L (ref 135–145)

## 2018-06-13 SURGERY — POSTERIOR LUMBAR FUSION 1 LEVEL
Anesthesia: General | Site: Spine Lumbar

## 2018-06-13 MED ORDER — MENTHOL 3 MG MT LOZG: 1.0000 | LOZENGE | OROMUCOSAL | Status: DC | PRN

## 2018-06-13 MED ORDER — 0.9 % SODIUM CHLORIDE (POUR BTL) OPTIME
TOPICAL | Status: DC | PRN
  Administered 2018-06-13 (×2): 1000 mL

## 2018-06-13 MED ORDER — BUPIVACAINE HCL (PF) 0.5 % IJ SOLN
INTRAMUSCULAR | Status: DC | PRN
  Administered 2018-06-13: 20 mL

## 2018-06-13 MED ORDER — ROCURONIUM BROMIDE 10 MG/ML (PF) SYRINGE
PREFILLED_SYRINGE | INTRAVENOUS | Status: DC | PRN
  Administered 2018-06-13 (×3): 10 mg via INTRAVENOUS
  Administered 2018-06-13: 50 mg via INTRAVENOUS
  Administered 2018-06-13 (×2): 10 mg via INTRAVENOUS

## 2018-06-13 MED ORDER — ROCURONIUM BROMIDE 50 MG/5ML IV SOSY
PREFILLED_SYRINGE | INTRAVENOUS | Status: AC
  Filled 2018-06-13: qty 5

## 2018-06-13 MED ORDER — ONDANSETRON HCL 4 MG/2ML IJ SOLN
INTRAMUSCULAR | Status: DC | PRN
  Administered 2018-06-13: 4 mg via INTRAVENOUS

## 2018-06-13 MED ORDER — ACETAMINOPHEN 325 MG PO TABS
650.0000 mg | ORAL_TABLET | ORAL | Status: DC | PRN
  Administered 2018-06-15: 650 mg via ORAL
  Filled 2018-06-13: qty 2

## 2018-06-13 MED ORDER — DEXAMETHASONE SODIUM PHOSPHATE 10 MG/ML IJ SOLN
INTRAMUSCULAR | Status: DC | PRN
  Administered 2018-06-13: 10 mg via INTRAVENOUS

## 2018-06-13 MED ORDER — ACETAMINOPHEN 650 MG RE SUPP: 650.0000 mg | RECTAL | Status: DC | PRN

## 2018-06-13 MED ORDER — DOCUSATE SODIUM 100 MG PO CAPS
100.0000 mg | ORAL_CAPSULE | Freq: Two times a day (BID) | ORAL | Status: DC
  Administered 2018-06-13 – 2018-06-19 (×12): 100 mg via ORAL
  Filled 2018-06-13 (×12): qty 1

## 2018-06-13 MED ORDER — LIDOCAINE 2% (20 MG/ML) 5 ML SYRINGE
INTRAMUSCULAR | Status: DC | PRN
  Administered 2018-06-13: 60 mg via INTRAVENOUS

## 2018-06-13 MED ORDER — ALBUMIN HUMAN 5 % IV SOLN
INTRAVENOUS | Status: DC | PRN
  Administered 2018-06-13 (×2): via INTRAVENOUS

## 2018-06-13 MED ORDER — CEFAZOLIN SODIUM-DEXTROSE 2-4 GM/100ML-% IV SOLN
2.0000 g | INTRAVENOUS | Status: AC
  Administered 2018-06-13: 2 g via INTRAVENOUS
  Filled 2018-06-13: qty 100

## 2018-06-13 MED ORDER — SODIUM CHLORIDE 0.9% FLUSH
3.0000 mL | Freq: Two times a day (BID) | INTRAVENOUS | Status: DC
  Administered 2018-06-14 – 2018-06-17 (×7): 3 mL via INTRAVENOUS

## 2018-06-13 MED ORDER — HEMOSTATIC AGENTS (NO CHARGE) OPTIME
TOPICAL | Status: DC | PRN
  Administered 2018-06-13: 1 via TOPICAL

## 2018-06-13 MED ORDER — PROPOFOL 10 MG/ML IV BOLUS
INTRAVENOUS | Status: AC
  Filled 2018-06-13: qty 20

## 2018-06-13 MED ORDER — MIDAZOLAM HCL 2 MG/2ML IJ SOLN
INTRAMUSCULAR | Status: AC
  Filled 2018-06-13: qty 2

## 2018-06-13 MED ORDER — DEXAMETHASONE SODIUM PHOSPHATE 10 MG/ML IJ SOLN
INTRAMUSCULAR | Status: AC
  Filled 2018-06-13: qty 1

## 2018-06-13 MED ORDER — SENNA 8.6 MG PO TABS
1.0000 | ORAL_TABLET | Freq: Two times a day (BID) | ORAL | Status: DC
  Administered 2018-06-13 – 2018-06-19 (×12): 8.6 mg via ORAL
  Filled 2018-06-13 (×12): qty 1

## 2018-06-13 MED ORDER — ONDANSETRON HCL 4 MG/2ML IJ SOLN: 4.0000 mg | Freq: Once | INTRAMUSCULAR | Status: DC | PRN

## 2018-06-13 MED ORDER — OXYCODONE HCL 5 MG PO TABS
10.0000 mg | ORAL_TABLET | ORAL | Status: DC | PRN
  Administered 2018-06-14 – 2018-06-17 (×7): 10 mg via ORAL
  Filled 2018-06-13 (×7): qty 2

## 2018-06-13 MED ORDER — HYDROMORPHONE HCL 1 MG/ML IJ SOLN
INTRAMUSCULAR | Status: AC
  Administered 2018-06-13: 0.5 mg via INTRAVENOUS
  Filled 2018-06-13: qty 1

## 2018-06-13 MED ORDER — THROMBIN 20000 UNITS EX SOLR
CUTANEOUS | Status: DC | PRN
  Administered 2018-06-13: 11:00:00 via TOPICAL

## 2018-06-13 MED ORDER — FOLIC ACID 1 MG PO TABS
1.0000 mg | ORAL_TABLET | Freq: Every day | ORAL | Status: DC
  Administered 2018-06-13 – 2018-06-19 (×7): 1 mg via ORAL
  Filled 2018-06-13 (×7): qty 1

## 2018-06-13 MED ORDER — CEFAZOLIN SODIUM-DEXTROSE 2-4 GM/100ML-% IV SOLN
2.0000 g | Freq: Three times a day (TID) | INTRAVENOUS | Status: AC
  Administered 2018-06-13 – 2018-06-14 (×2): 2 g via INTRAVENOUS
  Filled 2018-06-13 (×2): qty 100

## 2018-06-13 MED ORDER — MEPERIDINE HCL 50 MG/ML IJ SOLN: 6.2500 mg | INTRAMUSCULAR | Status: DC | PRN

## 2018-06-13 MED ORDER — PHENYLEPHRINE 40 MCG/ML (10ML) SYRINGE FOR IV PUSH (FOR BLOOD PRESSURE SUPPORT)
PREFILLED_SYRINGE | INTRAVENOUS | Status: AC
  Filled 2018-06-13: qty 10

## 2018-06-13 MED ORDER — METHOCARBAMOL 500 MG PO TABS
500.0000 mg | ORAL_TABLET | Freq: Four times a day (QID) | ORAL | Status: DC | PRN
  Administered 2018-06-14 – 2018-06-15 (×5): 500 mg via ORAL
  Filled 2018-06-13 (×5): qty 1

## 2018-06-13 MED ORDER — ALBUTEROL SULFATE (2.5 MG/3ML) 0.083% IN NEBU: 3.0000 mL | INHALATION_SOLUTION | Freq: Four times a day (QID) | RESPIRATORY_TRACT | Status: DC | PRN

## 2018-06-13 MED ORDER — LIDOCAINE-EPINEPHRINE 0.5 %-1:200000 IJ SOLN
INTRAMUSCULAR | Status: DC | PRN
  Administered 2018-06-13: 10 mL

## 2018-06-13 MED ORDER — DIPHENHYDRAMINE HCL 25 MG PO CAPS: 25.0000 mg | ORAL_CAPSULE | Freq: Four times a day (QID) | ORAL | Status: DC | PRN

## 2018-06-13 MED ORDER — ONDANSETRON HCL 4 MG PO TABS: 4.0000 mg | ORAL_TABLET | Freq: Four times a day (QID) | ORAL | Status: DC | PRN

## 2018-06-13 MED ORDER — DIPHENHYDRAMINE HCL 25 MG PO TABS
25.0000 mg | ORAL_TABLET | Freq: Four times a day (QID) | ORAL | Status: DC | PRN
  Filled 2018-06-13: qty 1

## 2018-06-13 MED ORDER — FENTANYL CITRATE (PF) 250 MCG/5ML IJ SOLN
INTRAMUSCULAR | Status: DC | PRN
  Administered 2018-06-13: 100 ug via INTRAVENOUS
  Administered 2018-06-13 (×3): 50 ug via INTRAVENOUS

## 2018-06-13 MED ORDER — BUPIVACAINE HCL (PF) 0.5 % IJ SOLN
INTRAMUSCULAR | Status: AC
  Filled 2018-06-13: qty 30

## 2018-06-13 MED ORDER — CHLORHEXIDINE GLUCONATE CLOTH 2 % EX PADS: 6.0000 | MEDICATED_PAD | Freq: Once | CUTANEOUS | Status: DC

## 2018-06-13 MED ORDER — HYDROMORPHONE HCL 1 MG/ML IJ SOLN
0.2500 mg | INTRAMUSCULAR | Status: DC | PRN
  Administered 2018-06-13 (×2): 0.5 mg via INTRAVENOUS

## 2018-06-13 MED ORDER — PROPOFOL 10 MG/ML IV BOLUS
INTRAVENOUS | Status: DC | PRN
  Administered 2018-06-13: 120 mg via INTRAVENOUS

## 2018-06-13 MED ORDER — SODIUM CHLORIDE 0.9 % IV SOLN
INTRAVENOUS | Status: DC
  Administered 2018-06-13 – 2018-06-16 (×3): via INTRAVENOUS

## 2018-06-13 MED ORDER — EPHEDRINE SULFATE-NACL 50-0.9 MG/10ML-% IV SOSY
PREFILLED_SYRINGE | INTRAVENOUS | Status: DC | PRN
  Administered 2018-06-13 (×4): 10 mg via INTRAVENOUS

## 2018-06-13 MED ORDER — ONDANSETRON HCL 4 MG/2ML IJ SOLN
INTRAMUSCULAR | Status: AC
  Filled 2018-06-13: qty 2

## 2018-06-13 MED ORDER — FENTANYL CITRATE (PF) 250 MCG/5ML IJ SOLN
INTRAMUSCULAR | Status: AC
  Filled 2018-06-13: qty 5

## 2018-06-13 MED ORDER — SODIUM CHLORIDE 0.9% FLUSH: 3.0000 mL | INTRAVENOUS | Status: DC | PRN

## 2018-06-13 MED ORDER — ONDANSETRON HCL 4 MG/2ML IJ SOLN: 4.0000 mg | Freq: Four times a day (QID) | INTRAMUSCULAR | Status: DC | PRN

## 2018-06-13 MED ORDER — PHENYLEPHRINE 40 MCG/ML (10ML) SYRINGE FOR IV PUSH (FOR BLOOD PRESSURE SUPPORT)
PREFILLED_SYRINGE | INTRAVENOUS | Status: DC | PRN
  Administered 2018-06-13: 80 ug via INTRAVENOUS

## 2018-06-13 MED ORDER — PHENOL 1.4 % MT LIQD: 1.0000 | OROMUCOSAL | Status: DC | PRN

## 2018-06-13 MED ORDER — THROMBIN (RECOMBINANT) 20000 UNITS EX SOLR
CUTANEOUS | Status: AC
  Filled 2018-06-13: qty 20000

## 2018-06-13 MED ORDER — LIDOCAINE 2% (20 MG/ML) 5 ML SYRINGE
INTRAMUSCULAR | Status: AC
  Filled 2018-06-13: qty 5

## 2018-06-13 MED ORDER — ATORVASTATIN CALCIUM 10 MG PO TABS
20.0000 mg | ORAL_TABLET | Freq: Every day | ORAL | Status: DC
  Administered 2018-06-13 – 2018-06-19 (×7): 20 mg via ORAL
  Filled 2018-06-13 (×7): qty 2

## 2018-06-13 MED ORDER — PANTOPRAZOLE SODIUM 20 MG PO TBEC
20.0000 mg | DELAYED_RELEASE_TABLET | Freq: Every day | ORAL | Status: DC
  Administered 2018-06-13 – 2018-06-19 (×7): 20 mg via ORAL
  Filled 2018-06-13 (×8): qty 1

## 2018-06-13 MED ORDER — HYDROCODONE-ACETAMINOPHEN 5-325 MG PO TABS
1.0000 | ORAL_TABLET | ORAL | Status: DC | PRN
  Administered 2018-06-13 – 2018-06-19 (×11): 1 via ORAL
  Filled 2018-06-13 (×11): qty 1

## 2018-06-13 MED ORDER — SODIUM CHLORIDE 0.9 % IV SOLN: 250.0000 mL | INTRAVENOUS | Status: DC

## 2018-06-13 MED ORDER — IRBESARTAN 300 MG PO TABS
300.0000 mg | ORAL_TABLET | Freq: Every day | ORAL | Status: DC
  Administered 2018-06-13 – 2018-06-19 (×7): 300 mg via ORAL
  Filled 2018-06-13 (×7): qty 1

## 2018-06-13 MED ORDER — VITAMIN B-12 1000 MCG PO TABS
1000.0000 ug | ORAL_TABLET | Freq: Every day | ORAL | Status: DC
  Administered 2018-06-13 – 2018-06-19 (×7): 1000 ug via ORAL
  Filled 2018-06-13 (×7): qty 1

## 2018-06-13 MED ORDER — LIDOCAINE-EPINEPHRINE 0.5 %-1:200000 IJ SOLN
INTRAMUSCULAR | Status: AC
  Filled 2018-06-13: qty 1

## 2018-06-13 MED ORDER — SUGAMMADEX SODIUM 200 MG/2ML IV SOLN
INTRAVENOUS | Status: DC | PRN
  Administered 2018-06-13: 200 mg via INTRAVENOUS

## 2018-06-13 MED ORDER — LACTATED RINGERS IV SOLN
INTRAVENOUS | Status: DC
  Administered 2018-06-13 (×3): via INTRAVENOUS

## 2018-06-13 MED ORDER — EPHEDRINE 5 MG/ML INJ
INTRAVENOUS | Status: AC
  Filled 2018-06-13: qty 10

## 2018-06-13 SURGICAL SUPPLY — 65 items
ADH SKN CLS APL DERMABOND .7 (GAUZE/BANDAGES/DRESSINGS) ×1
BUR MATCHSTICK NEURO 3.0 LAGG (BURR) ×2 IMPLANT
BUR PRECISION FLUTE 5.0 (BURR) ×2 IMPLANT
CANISTER SUCT 3000ML PPV (MISCELLANEOUS) ×2 IMPLANT
CARTRIDGE OIL MAESTRO DRILL (MISCELLANEOUS) ×1 IMPLANT
CONN SPINAL LAT 12XF/6.35 ROD (Connector) ×2 IMPLANT
CONNECTOR 6.35 6.35 (Connector) ×1 IMPLANT
CONNECTOR SPNL LAT 12XF/6.35RD (Connector) IMPLANT
CONT SPEC 4OZ CLIKSEAL STRL BL (MISCELLANEOUS) ×2 IMPLANT
COVER BACK TABLE 60X90IN (DRAPES) ×2 IMPLANT
COVER WAND RF STERILE (DRAPES) ×2 IMPLANT
DERMABOND ADVANCED (GAUZE/BANDAGES/DRESSINGS) ×1
DERMABOND ADVANCED .7 DNX12 (GAUZE/BANDAGES/DRESSINGS) ×1 IMPLANT
DIFFUSER DRILL AIR PNEUMATIC (MISCELLANEOUS) ×2 IMPLANT
DRAPE C-ARM 42X72 X-RAY (DRAPES) ×3 IMPLANT
DRAPE C-ARMOR (DRAPES) ×1 IMPLANT
DRAPE LAPAROTOMY 100X72X124 (DRAPES) ×2 IMPLANT
DRAPE SURG 17X23 STRL (DRAPES) ×2 IMPLANT
DRSG OPSITE POSTOP 4X8 (GAUZE/BANDAGES/DRESSINGS) ×1 IMPLANT
DURAPREP 26ML APPLICATOR (WOUND CARE) ×2 IMPLANT
ELECT BLADE 4.0 EZ CLEAN MEGAD (MISCELLANEOUS) ×2
ELECT REM PT RETURN 9FT ADLT (ELECTROSURGICAL) ×2
ELECTRODE BLDE 4.0 EZ CLN MEGD (MISCELLANEOUS) IMPLANT
ELECTRODE REM PT RTRN 9FT ADLT (ELECTROSURGICAL) ×1 IMPLANT
GAUZE 4X4 16PLY RFD (DISPOSABLE) IMPLANT
GAUZE SPONGE 4X4 12PLY STRL (GAUZE/BANDAGES/DRESSINGS) IMPLANT
GLOVE SS N UNI LF 6.0 STRL (GLOVE) ×2 IMPLANT
GLOVE SS N UNI LF 6.5 STRL (GLOVE) ×4 IMPLANT
GLOVE SS N UNI LF 7.0 STRL (GLOVE) ×3 IMPLANT
GOWN STRL REUS W/ TWL LRG LVL3 (GOWN DISPOSABLE) ×2 IMPLANT
GOWN STRL REUS W/ TWL XL LVL3 (GOWN DISPOSABLE) IMPLANT
GOWN STRL REUS W/TWL 2XL LVL3 (GOWN DISPOSABLE) IMPLANT
GOWN STRL REUS W/TWL LRG LVL3 (GOWN DISPOSABLE) ×4
GOWN STRL REUS W/TWL XL LVL3 (GOWN DISPOSABLE) ×4
GRAFT DURAGEN MATRIX 2WX2L ×2 IMPLANT
KIT BASIN OR (CUSTOM PROCEDURE TRAY) ×2 IMPLANT
KIT POSITION SURG JACKSON T1 (MISCELLANEOUS) ×2 IMPLANT
KIT TURNOVER KIT B (KITS) ×2 IMPLANT
MILL MEDIUM DISP (BLADE) ×1 IMPLANT
NDL HYPO 25X1 1.5 SAFETY (NEEDLE) ×1 IMPLANT
NDL SPNL 18GX3.5 QUINCKE PK (NEEDLE) IMPLANT
NEEDLE HYPO 25X1 1.5 SAFETY (NEEDLE) ×2 IMPLANT
NEEDLE SPNL 18GX3.5 QUINCKE PK (NEEDLE) ×2 IMPLANT
NS IRRIG 1000ML POUR BTL (IV SOLUTION) ×3 IMPLANT
OIL CARTRIDGE MAESTRO DRILL (MISCELLANEOUS) ×2
PACK LAMINECTOMY NEURO (CUSTOM PROCEDURE TRAY) ×2 IMPLANT
PAD ARMBOARD 7.5X6 YLW CONV (MISCELLANEOUS) ×5 IMPLANT
RASP HELIOCORDIAL MED (MISCELLANEOUS) ×1 IMPLANT
ROD DANEK 30MM (Rod) ×2 IMPLANT
SCREW CONNECTOR SET (Screw) ×3 IMPLANT
SCREW PEDICLE VA L635 5.5X45M (Screw) ×2 IMPLANT
SCREW SET BREAK OFF (Screw) ×3 IMPLANT
SEALANT ADHERUS EXTEND TIP (MISCELLANEOUS) ×1 IMPLANT
SPONGE LAP 4X18 RFD (DISPOSABLE) IMPLANT
SPONGE SURGIFOAM ABS GEL 100 (HEMOSTASIS) ×2 IMPLANT
STRIP CLOSURE SKIN 1/2X4 (GAUZE/BANDAGES/DRESSINGS) IMPLANT
SUT ETHILON 3 0 FSL (SUTURE) ×1 IMPLANT
SUT PROLENE 6 0 BV (SUTURE) IMPLANT
SUT VIC AB 0 CT1 18XCR BRD8 (SUTURE) ×1 IMPLANT
SUT VIC AB 0 CT1 8-18 (SUTURE) ×4
SUT VIC AB 2-0 CT1 18 (SUTURE) ×2 IMPLANT
SUT VIC AB 3-0 SH 8-18 (SUTURE) ×2 IMPLANT
TOWEL GREEN STERILE (TOWEL DISPOSABLE) ×2 IMPLANT
TOWEL GREEN STERILE FF (TOWEL DISPOSABLE) ×2 IMPLANT
WATER STERILE IRR 1000ML POUR (IV SOLUTION) ×2 IMPLANT

## 2018-06-13 NOTE — Progress Notes (Signed)
Patient requested essential oil  For stress, affected by pain and hospital stay. 2 gtts of lavender on cottonball in med cup placed at bedside table. Will monitor for effectiveness.

## 2018-06-13 NOTE — Progress Notes (Signed)
Patient arrived from PACU, VSS, NAD noted, back dressing C/D/I. MD paged, awaiting rtn call for orders.

## 2018-06-13 NOTE — Transfer of Care (Signed)
Immediate Anesthesia Transfer of Care Note  Patient: Joy [name]  Procedure(s) Performed: Lumbar Two-Three Posterior Lumbar Fusion, Extension of Prior Fusion (N/A Spine Lumbar)  Patient Location: PACU  Anesthesia Type:General  Level of Consciousness: awake, alert , oriented and patient cooperative  Airway & Oxygen Therapy: Patient Spontanous Breathing and Patient connected to nasal cannula oxygen  Post-op Assessment: Report given to RN, Post -op Vital signs reviewed and stable and Patient moving all extremities  Post vital signs: Reviewed and stable  Last Vitals:  Vitals Value Taken Time  BP 157/63 06/13/2018  3:52 PM  Temp    Pulse 68 06/13/2018  3:57 PM  Resp 16 06/13/2018  3:57 PM  SpO2 100 % 06/13/2018  3:57 PM  Vitals shown include unvalidated device data.  Last Pain:  Vitals:   06/13/18 1554  PainSc: (P) 0-No pain         Complications: No apparent anesthesia complications

## 2018-06-13 NOTE — H&P (Signed)
BP (!) 154/69   Pulse 82   Temp 98.1 F (36.7 C)   Resp 20   SpO2 98%  [name] returns today. She did have a knee operation, and she is still having pain posteriorly in the lower extremities. Dr. Lequita Halt has checked and evaluated the knee replacements and feels that they are working well. 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 I will repeat the MRI only because it is too old at this point having been done in January, but the 2-3 level has been getting worse and the documented progression is since 2015. She is stenotic. She is retrolisthesed. She has a degenerated disc. This is shown both on myelogram and MRI. I will order a new MRI just to make sure that there is no other issue there. She is 5 feet 1 inch, weighs 201 pounds. Temperature is 98.1, blood pressure 145/80, pulse is 85. We will get this scheduled for sometime in October. Allergies  Allergen Reactions  . Codeine Other (See Comments)    Lump in throat  . Tape Hives and Rash    Paper tape only  . Latex Rash  . Nickel Rash    Bumps Also other metals   Past Medical History:  Diagnosis Date  . Ankylosing spondylitis (HCC)   . Arthritis    RHEUMATOID  . Bronchitis   . COPD (chronic obstructive pulmonary disease) (HCC)    CXR 01/11/18 showed mild COPD and chronic bronchitis  . CTS (carpal tunnel syndrome)   . Dysrhythmia    "irregularity" unknown at this time - being evaluated by cardiology  . Essential hypertension 11/24/2015  . GERD (gastroesophageal reflux disease)   . Hypercholesteremia   . Hyperlipidemia 11/24/2015  . Hypertension   . IBS (irritable bowel syndrome)   . Neuropathy   . OAB (overactive bladder)   . Osteoarthritis   . Osteoporosis   . RA (rheumatoid arthritis) (HCC)   . Rheumatoid arthritis (HCC) 11/24/2015  . Seasonal allergies    Past Surgical History:  Procedure Laterality Date  . ABDOMINAL HYSTERECTOMY  1995  . BACK SURGERY    . BREAST SURGERY     REDUCTION   . CATARACT EXTRACTION W/PHACO  08/01/2012   Procedure: CATARACT EXTRACTION PHACO AND INTRAOCULAR LENS PLACEMENT (IOC);  Surgeon: Chalmers Guest, MD;  Location: [facility] OR;  Service: Ophthalmology;  Laterality: Right;  . HERNIA REPAIR     RIGHT ING.  . JOINT REPLACEMENT  2014   rt total knee  . TONSILLECTOMY    . 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 Heart disease Mother   . Cerebral aneurysm Mother   . Heart attack Father   . Heart disease Father   . Hypertension Sister   . Arthritis Sister   . Stroke Sister   . Hypertension Sister   . Arthritis Sister   . Hypertension Sister   . Arthritis Sister   . Hypertension Sister   . Hypertension Sister    Social History   Socioeconomic History  . Marital status: Divorced    Spouse name: Not on file  . Number of children: Not on file  . Years of education: Not on file  . Highest education level: Not on file  Occupational History  . Not on file  Social Needs  .  Financial resource strain: Not on file  . Food insecurity:    Worry: Not on file    Inability: Not on file  . Transportation needs:    Medical: Not on file    Non-medical: Not on file  Tobacco Use  . Smoking status: Former Smoker    Packs/day: 1.00    Years: 35.00    Pack years: 35.00    Last attempt to quit: 05/29/1995    Years since quitting: 23.0  . Smokeless tobacco: Never Used  Substance and Sexual Activity  . Alcohol use: Yes    Comment: rare  . Drug use: No  . Sexual activity: Not on file  Lifestyle  . Physical activity:    Days per week: Not on file    Minutes per session: Not on file  . Stress: Not on file  Relationships  . Social connections:    Talks on phone: Not on file    Gets together: Not on file    Attends religious service: Not on file    Active member of club or  organization: Not on file    Attends meetings of clubs or organizations: Not on file    Relationship status: Not on file  . Intimate partner violence:    Fear of current or ex partner: Not on file    Emotionally abused: Not on file    Physically abused: Not on file    Forced sexual activity: Not on file  Other Topics Concern  . Not on file  Social History Narrative  . Not on file   Physical Exam  Constitutional: She is oriented to person, place, and time. She appears well-developed and well-nourished. No distress.  HENT:  Head: Normocephalic and atraumatic.  Right Ear: External ear normal.  Left Ear: External ear normal.  Eyes: Pupils are equal, round, and reactive to light. EOM are normal.  Neck: Normal range of motion. Neck supple.  Cardiovascular: Normal rate, regular rhythm, normal heart sounds and intact distal pulses.  Pulmonary/Chest: Effort normal and breath sounds normal.  Neurological: She is alert and oriented to person, place, and time. She displays normal reflexes. No cranial nerve deficit or sensory deficit. She exhibits normal muscle tone. Coordination normal.  Skin: Skin is warm and dry.  Psychiatric: She has a normal mood and affect. Her behavior is normal. Judgment and thought content normal.

## 2018-06-13 NOTE — Anesthesia Postprocedure Evaluation (Signed)
Anesthesia Post Note  Patient: Joy [name]  Procedure(s) Performed: Lumbar Two-Three Posterior Lumbar Fusion, Extension of Prior Fusion (N/A Spine Lumbar)     Patient location during evaluation: PACU Anesthesia Type: General Level of consciousness: awake and alert and oriented Pain management: pain level controlled Vital Signs Assessment: post-procedure vital signs reviewed and stable Respiratory status: spontaneous breathing, nonlabored ventilation, respiratory function stable and patient connected to nasal cannula oxygen Cardiovascular status: blood pressure returned to baseline and stable Postop Assessment: no apparent nausea or vomiting Anesthetic complications: no    Last Vitals:  Vitals:   06/13/18 1637 06/13/18 1658  BP:  134/66  Pulse:  73  Resp:  18  Temp: 36.5 C   SpO2:  100%    Last Pain:  Vitals:   06/13/18 1637  PainSc: 2                  Mima Cranmore A.

## 2018-06-13 NOTE — Anesthesia Procedure Notes (Signed)
Procedure Name: Intubation Date/Time: 06/13/2018 10:11 AM Performed by: Myna Bright, CRNA Pre-anesthesia Checklist: Patient identified, Emergency Drugs available, Suction available and Patient being monitored Patient Re-evaluated:Patient Re-evaluated prior to induction Oxygen Delivery Method: Circle system utilized Preoxygenation: Pre-oxygenation with 100% oxygen Induction Type: IV induction Ventilation: Mask ventilation without difficulty Laryngoscope Size: Mac and 3 Grade View: Grade I Tube type: Oral Tube size: 7.5 mm Number of attempts: 1 Airway Equipment and Method: Stylet Placement Confirmation: ETT inserted through vocal cords under direct vision,  positive ETCO2 and breath sounds checked- equal and bilateral Secured at: 21 cm Tube secured with: Tape Dental Injury: Teeth and Oropharynx as per pre-operative assessment

## 2018-06-14 MED ORDER — MORPHINE SULFATE (PF) 2 MG/ML IV SOLN
2.0000 mg | INTRAVENOUS | Status: DC | PRN
  Administered 2018-06-15 (×2): 2 mg via INTRAVENOUS
  Filled 2018-06-14 (×3): qty 1

## 2018-06-14 NOTE — Evaluation (Signed)
Physical Therapy Evaluation Patient Details Name: Joy [name] MRN: [id number] DOB: Jun 10, 1941 Today's Date: 06/14/2018   History of Present Illness  Pt is a 78 y/o female s/p L2/L3 lumbar fusion. Pt had previously suffered from stenosis, spondlytic and degenerative disc disease. PMH includes CPOD, B TKA, HTN, rheumatoid arthritis, prior back sx.  Clinical Impression  Pt presented alert, supine with HOB elevated and willing to participate in therapy. Prior to admission, pt stated independence at home with her daughter. Pt stated continued pain from prior surgery, and displayed guarding and increased time needed while performing supine to sidelying and sit to stand. Pt was min A for supine to sidelying and sit to stand. Pt took 3 lateral steps to left to perform stand to sit in a recliner chair. Pt was educated on back precautions, including bending lifting and twisting. Pt will benefit from skilled PT while admitted in order to progress gait, mobility, function and strength. Today will recommend SNF at DC due to lack of 24/7 supervision and decrease in overall function. Will continue to follow and update recommendations as needed.    Follow Up Recommendations SNF;Supervision - Intermittent    Equipment Recommendations  None recommended by PT    Recommendations for Other Services       Precautions / Restrictions Precautions Precautions: Fall;Back Precaution Comments: reviewed precautions with patinet  Required Braces or Orthoses: Spinal Brace Spinal Brace: Lumbar corset;Applied in sitting position Restrictions Weight Bearing Restrictions: No      Mobility  Bed Mobility Overal bed mobility: Needs Assistance Bed Mobility: Rolling;Sidelying to Sit Rolling: Min assist Sidelying to sit: Min assist     Sit to sidelying: Min assist General bed mobility comments: verbal and tactile cueing for log roll. Min assist for log roll technique   Transfers Overall transfer level: Needs  assistance Equipment used: Rolling walker (2 wheeled) Transfers: Sit to/from Stand Sit to Stand: Min assist         General transfer comment: verbal cueing for hand placement during sit to stand. tactile feedback for posture during standing.   Ambulation/Gait                Stairs            Wheelchair Mobility    Modified Rankin (Stroke Patients Only)       Balance Overall balance assessment: Needs assistance Sitting-balance support: No upper extremity supported;Feet supported Sitting balance-Leahy Scale: Fair Sitting balance - Comments: pt able to tolerate sitting balance. states continued pain throughout    Standing balance support: Bilateral upper extremity supported;During functional activity Standing balance-Leahy Scale: Poor Standing balance comment: reliant on B UE support                             Pertinent Vitals/Pain Pain Assessment: Faces Faces Pain Scale: Hurts whole lot Pain Location: back incision, R thigh  Pain Descriptors / Indicators: Constant;Grimacing;Guarding;Nagging Pain Intervention(s): Monitored during session;Limited activity within patient's tolerance;RN gave pain meds during session    Home Living Family/patient expects to be discharged to:: Private residence Living Arrangements: Children Available Help at Discharge: Family;Available PRN/intermittently Type of Home: House Home Access: Stairs to enter Entrance Stairs-Rails: Can reach both Entrance Stairs-Number of Steps: 2 Home Layout: Two level Home Equipment: Bedside commode;Wheelchair - [employer] - 2 wheels      Prior Function Level of Independence: Independent         Comments: independent ADLs, limited IADLs, +driving, active  Hand Dominance   Dominant Hand: Right    Extremity/Trunk Assessment   Upper Extremity Assessment Upper Extremity Assessment: Overall WFL for tasks assessed    Lower Extremity Assessment Lower Extremity  Assessment: Overall WFL for tasks assessed    Cervical / Trunk Assessment Cervical / Trunk Assessment: Other exceptions Cervical / Trunk Exceptions: s/p spinal sx  Communication   Communication: No difficulties  Cognition Arousal/Alertness: Awake/alert Behavior During Therapy: WFL for tasks assessed/performed Overall Cognitive Status: Within Functional Limits for tasks assessed                                        General Comments General comments (skin integrity, edema, etc.): daughter present and supportive    Exercises     Assessment/Plan    PT Assessment Patient needs continued PT services  PT Problem List Decreased strength;Decreased balance;Decreased mobility;Decreased coordination       PT Treatment Interventions Gait training;Stair training;Functional mobility training;Therapeutic activities;Therapeutic exercise;Balance training;Neuromuscular re-education;DME instruction    PT Goals (Current goals can be found in the Care Plan section)  Acute Rehab PT Goals Patient Stated Goal: to get better and without pain PT Goal Formulation: With patient Time For Goal Achievement: 06/28/18 Potential to Achieve Goals: Good    Frequency Min 5X/week   Barriers to discharge        Co-evaluation               AM-PAC PT "6 Clicks" Daily Activity  Outcome Measure Difficulty turning over in bed (including adjusting bedclothes, sheets and blankets)?: A Lot Difficulty moving from lying on back to sitting on the side of the bed? : A Lot Difficulty sitting down on and standing up from a chair with arms (e.g., wheelchair, bedside commode, etc,.)?: Unable Help needed moving to and from a bed to chair (including a wheelchair)?: A Lot Help needed walking in hospital room?: A Lot Help needed climbing 3-5 steps with a railing? : Total 6 Click Score: 10    End of Session Equipment Utilized During Treatment: Gait belt;Back brace Activity Tolerance: Patient  limited by pain Patient left: in chair;with call bell/phone within reach;with family/visitor present Nurse Communication: Mobility status PT Visit Diagnosis: Other abnormalities of gait and mobility (R26.89)    Time: 6712-4580 PT Time Calculation (min) (ACUTE ONLY): 32 min   Charges:   PT Evaluation $PT Eval Moderate Complexity: 1 Mod PT Treatments $Therapeutic Activity: 8-22 mins        Rolm Bookbinder, SPT Acute Rehab [phone] (pager) [phone] (office)  Kalie Cabral 06/14/2018, 11:57 AM

## 2018-06-14 NOTE — Progress Notes (Signed)
PT Progress Note for Charges    06/14/18 1153  PT General Charges  $$ ACUTE PT VISIT 1 Visit  PT Evaluation  $PT Eval Moderate Complexity 1 Mod  PT Treatments  $Therapeutic Activity 8-22 mins   Deborah Chalk, PT, DPT  Acute Rehabilitation Services Pager [phone] Office [phone]

## 2018-06-14 NOTE — Progress Notes (Signed)
NEUROSURGERY PROGRESS NOTE  Doing well. Complains of appropriate back soreness. Some right leg pain No numbness, tingling or weakness Good strength and sensation Incision CDI  Temp:  [97.5 F (36.4 C)-99 F (37.2 C)] 99 F (37.2 C) (10/26 0819) Pulse Rate:  [63-108] 66 (10/26 0819) Resp:  [8-18] 18 (10/26 0819) BP: (114-157)/(60-75) 137/60 (10/26 0819) SpO2:  [84 %-100 %] 98 % (10/26 0819)  Plan: According to patient, dr. Franky Macho said she had to lay flat for a couple days. CSF leak?   Sherryl Manges, NP 06/14/2018 9:08 AM

## 2018-06-14 NOTE — Evaluation (Addendum)
Occupational Therapy Evaluation Patient Details Name: Joy [name] MRN: [id number] DOB: Feb 13, 1941 Today's Date: 06/14/2018    History of Present Illness Pt is a 77 y/o female s/p L2-3 posterior lumbar fusion from spndlyitic disease and degenerative disc disease. PMH signifincat for but not limited to: COPD, broncitis, rheumatoid arthritis, HTN, prior back sx, B TKA.    Clinical Impression   Pt supine in bed and agreeable to participate with encouragement, cleared for mobility with RN.  Patient hesitant due to fear of pain, but overall requires min assist given increased time.  Pt admitted for above and limited by below, see problem list, prior to admission she was independent.  She currently requires min assist for bed mobility and transfers, setup assist for UB ADLs and mod to max assist for LB ADLs.  Educated on safety, precautions, back management and wear schedule, ADL compensatory techniques, safety and recommendations. She will benefit from continued OT services while admitted and based on performance today recommend SNF rehab at dc.  Will continue to follow and updated recommendations at needed.    Follow Up Recommendations  SNF;Supervision/Assistance - 24 hour(may progress to [facility] services)    Equipment Recommendations  3 in 1 bedside commode    Recommendations for Other Services PT consult     Precautions / Restrictions Precautions Precautions: Fall;Back Precaution Comments: reviewed precautions with patinet  Required Braces or Orthoses: Spinal Brace Spinal Brace: Lumbar corset;Applied in sitting position Restrictions Weight Bearing Restrictions: No      Mobility Bed Mobility Overal bed mobility: Needs Assistance Bed Mobility: Rolling;Sidelying to Sit;Sit to Sidelying Rolling: Min assist Sidelying to sit: Min assist     Sit to sidelying: Min assist General bed mobility comments: min assist for mobility given cueing for sequencing for log roll  technique  Transfers Overall transfer level: Needs assistance Equipment used: Rolling walker (2 wheeled) Transfers: Sit to/from Stand Sit to Stand: Min assist         General transfer comment: min assist to power up into standing, cueing for hand placement and body mechanics    Balance Overall balance assessment: Needs assistance Sitting-balance support: No upper extremity supported;Feet supported Sitting balance-Leahy Scale: Fair     Standing balance support: Bilateral upper extremity supported;During functional activity Standing balance-Leahy Scale: Poor Standing balance comment: reliant on B UE support                           ADL either performed or assessed with clinical judgement   ADL Overall ADL's : Needs assistance/impaired     Grooming: Set up;Sitting   Upper Body Bathing: Set up;Sitting   Lower Body Bathing: Moderate assistance;Sit to/from stand   Upper Body Dressing : Set up;Sitting   Lower Body Dressing: Maximal assistance;Sit to/from stand Lower Body Dressing Details (indicate cue type and reason): unable to complete figure 4 technique at this time, min assist in standing  Toilet Transfer: Minimal assistance;Ambulation;RW(simulated in room)   Toileting- Clothing Manipulation and Hygiene: Moderate assistance;Sit to/from stand       Functional mobility during ADLs: Minimal assistance;Rolling walker General ADL Comments: Pt very hesitant for mobility, overall min assist given increased time.  Cueing and educated on precautions and compenastory techniques.     Vision Baseline Vision/History: Wears glasses Wears Glasses: Reading only Patient Visual Report: (hx of cataracts and removal ) Vision Assessment?: No apparent visual deficits     Perception     Praxis  Pertinent Vitals/Pain Pain Assessment: Faces Faces Pain Scale: Hurts even more Pain Location: back incision Pain Descriptors / Indicators: Discomfort;Grimacing;Operative  site guarding Pain Intervention(s): Limited activity within patient's tolerance;Repositioned;Patient requesting pain meds-RN notified     Hand Dominance Right   Extremity/Trunk Assessment Upper Extremity Assessment Upper Extremity Assessment: Overall WFL for tasks assessed   Lower Extremity Assessment Lower Extremity Assessment: Defer to PT evaluation   Cervical / Trunk Assessment Cervical / Trunk Assessment: Other exceptions Cervical / Trunk Exceptions: s/p spinal sx   Communication Communication Communication: No difficulties   Cognition Arousal/Alertness: Awake/alert Behavior During Therapy: WFL for tasks assessed/performed Overall Cognitive Status: Within Functional Limits for tasks assessed                                     General Comments  daughter present and supportive    Exercises     Shoulder Instructions      Home Living Family/patient expects to be discharged to:: Private residence Living Arrangements: Other relatives;Children(daughter, grandchild) Available Help at Discharge: Family;Available 24 hours/day Type of Home: House Home Access: Stairs to enter [employer] of Steps: 2   Home Layout: Two level;Able to live on main level with bedroom/bathroom     Bathroom Shower/Tub: Chief Strategy Officer: Standard     Home Equipment: Bedside commode;Wheelchair - [employer] - 2 wheels          Prior Functioning/Environment Level of Independence: Independent        Comments: independent ADLs, limited IADLs, +driving, active        OT Problem List: Decreased strength;Decreased activity tolerance;Impaired balance (sitting and/or standing);Decreased safety awareness;Decreased knowledge of use of DME or AE;Decreased knowledge of precautions;Pain      OT Treatment/Interventions: Self-care/ADL training;Therapeutic exercise;Energy conservation;DME and/or AE instruction;Therapeutic activities;Patient/family  education;Balance training    OT Goals(Current goals can be found in the care plan section) Acute Rehab OT Goals Patient Stated Goal: to get some rehab  OT Goal Formulation: With patient Time For Goal Achievement: 06/28/18 Potential to Achieve Goals: Good  OT Frequency: Min 2X/week   Barriers to D/C:            Co-evaluation              AM-PAC PT "6 Clicks" Daily Activity     Outcome Measure Help from another person eating meals?: None Help from another person taking care of personal grooming?: A Little Help from another person toileting, which includes using toliet, bedpan, or urinal?: A Lot Help from another person bathing (including washing, rinsing, drying)?: A Lot Help from another person to put on and taking off regular upper body clothing?: None Help from another person to put on and taking off regular lower body clothing?: A Lot 6 Click Score: 17   End of Session Equipment Utilized During Treatment: Gait belt;Rolling walker;Back brace Nurse Communication: Mobility status;Patient requests pain meds  Activity Tolerance: Patient tolerated treatment well Patient left: in bed;with call bell/phone within reach;with family/visitor present  OT Visit Diagnosis: Other abnormalities of gait and mobility (R26.89);Muscle weakness (generalized) (M62.81);Pain Pain - part of body: (back)                Time: 6415-8309 OT Time Calculation (min): 29 min Charges:  OT General Charges $OT Visit: 1 Visit OT Evaluation $OT Eval Moderate Complexity: 1 Mod OT Treatments $Self Care/Home Management : 8-22 mins  Chancy Milroy,  OT Acute Rehabilitation Services Pager [phone] Office [phone]   Chancy Milroy 06/14/2018, 10:34 AM

## 2018-06-15 MED ORDER — DEXAMETHASONE SODIUM PHOSPHATE 10 MG/ML IJ SOLN
4.0000 mg | Freq: Four times a day (QID) | INTRAMUSCULAR | Status: AC
  Administered 2018-06-15 – 2018-06-16 (×3): 4 mg via INTRAVENOUS
  Filled 2018-06-15 (×3): qty 1

## 2018-06-15 NOTE — Clinical Social Work Note (Signed)
Clinical Social Work Assessment  Patient Details  Name: Joy [name] MRN: [id number] Date of Birth: October 23, 1940  Date of referral:  06/15/18               Reason for consult:  Facility Placement, Discharge Planning                Permission sought to share information with:  Facility Sport and exercise psychologist, Family Supports Permission granted to share information::  Yes, Verbal Permission Granted  Name::     Joy [name]  Agency::  SNFs  Relationship::  daughter  Contact Information:  [phone]  Housing/Transportation Living arrangements for the past 2 months:  Single Family Home Source of Information:  Patient, Adult Children Patient Interpreter Needed:  None Criminal Activity/Legal Involvement Pertinent to Current Situation/Hospitalization:  No - Comment as needed Significant Relationships:  Adult Children, Other Family Members Lives with:  Adult Children Do you feel safe going back to the place where you live?  Yes Need for family participation in patient care:  Yes (Comment)  Care giving concerns: Patient from home with daughter. PT recommending SNF.   Social Worker assessment / plan: CSW met with patient and daughter, Joy [name], at bedside. Patient alert and oriented. CSW introduced self and discussed disposition planning - PT recommendation for SNF. Patient and daughter agreeable to SNF and prefer [facility], [place], [place], or [facility]. Patient's daughter lives in [place] and is hopeful for a facility in that area.   CSW sent out initial SNF referrals, awaiting bed offers. Will provide bed offers when available. CSW to follow for patient's medical readiness and support with discharge planning.   Employment status:  Retired Forensic scientist:  Medicare PT Recommendations:  [place] / Referral to community resources:  [place]  Patient/Family's Response to care: Patient and daughter appreciative of  care.  Patient/Family's Understanding of and Emotional Response to Diagnosis, Current Treatment, and Prognosis: Patient and daughter with good understanding of patient's condition and agreeable to SNF.  Emotional Assessment Appearance:  Appears stated age Attitude/Demeanor/Rapport:  Engaged Affect (typically observed):  Calm, Accepting, Pleasant, Appropriate Orientation:  Oriented to Self, Oriented to Place, Oriented to  Time, Oriented to Situation Alcohol / Substance use:  Not Applicable Psych involvement (Current and /or in the community):  No (Comment)  Discharge Needs  Concerns to be addressed:  Discharge Planning Concerns, Care Coordination Readmission within the last 30 days:  No Current discharge risk:  Physical Impairment Barriers to Discharge:  Continued Medical Work up   Joy Emms, LCSW 06/15/2018, 2:17 PM

## 2018-06-15 NOTE — NC FL2 (Signed)
[place] MEDICAID FL2 LEVEL OF CARE SCREENING TOOL     IDENTIFICATION  Patient Name: Joy [name] Birthdate: August 15, 1941 Sex: female Admission Date (Current Location): 06/13/2018  [facility] and [place] Number:  Producer, television/film/video and Address:  [facility], [address] [place], [place] [address]      Provider Number: [id number]  Attending Physician Name and Address:  Coletta Memos, MD  Relative Name and Phone Number:  Hermine Feria, daughter, [phone]    Current Level of Care: Hospital Recommended Level of Care: Skilled Nursing Facility Prior Approval Number:    Date Approved/Denied:   PASRR Number: [id number]  Discharge Plan: Home    Current Diagnoses: Patient Active Problem List   Diagnosis Date Noted  . Spondylosis 06/13/2018  . Status post lumbar spinal fusion 06/13/2018  . Status post lumbar spine surgery for decompression of spinal cord 06/13/2018  . OA (osteoarthritis) of knee 12/26/2015  . Essential hypertension 11/24/2015  . Hyperlipidemia 11/24/2015  . Rheumatoid arthritis (HCC) 11/24/2015  . Lumbar spondylosis 07/21/2014    Orientation RESPIRATION BLADDER Height & Weight     Self, Time, Place, Situation  Normal Continent, External catheter Weight:   Height:     BEHAVIORAL SYMPTOMS/MOOD NEUROLOGICAL BOWEL NUTRITION STATUS      Continent Diet(please see DC summary)  AMBULATORY STATUS COMMUNICATION OF NEEDS Skin   Limited Assist Verbally Surgical wounds(closed incision on back)                       Personal Care Assistance Level of Assistance  Bathing, Feeding, Dressing Bathing Assistance: Limited assistance Feeding assistance: Independent Dressing Assistance: Limited assistance     Functional Limitations Info  Sight, Hearing, Speech Sight Info: Adequate Hearing Info: Adequate Speech Info: Adequate    SPECIAL CARE FACTORS FREQUENCY  PT (By licensed PT), OT (By licensed OT)     PT Frequency:  5x/week OT Frequency: 5x/week            Contractures Contractures Info: Not present    Additional Factors Info  Code Status, Allergies Code Status Info: Full Allergies Info: Codeine, Tape, Latex, Nickel           Current Medications (06/15/2018):  This is the current hospital active medication list Current Facility-Administered Medications  Medication Dose Route Frequency Provider Last Rate Last Dose  . 0.9 %  sodium chloride infusion  250 mL Intravenous Continuous Meyran, Tiana Loft, NP      . 0.9 %  sodium chloride infusion   Intravenous Continuous Meyran, Tiana Loft, NP 50 mL/hr at 06/15/18 0947    . acetaminophen (TYLENOL) tablet 650 mg  650 mg Oral Q4H PRN Meyran, Tiana Loft, NP   650 mg at 06/15/18 1137   Or  . acetaminophen (TYLENOL) suppository 650 mg  650 mg Rectal Q4H PRN Meyran, Tiana Loft, NP      . albuterol (PROVENTIL) (2.5 MG/3ML) 0.083% nebulizer solution 3 mL  3 mL Inhalation Q6H PRN Meyran, Tiana Loft, NP      . atorvastatin (LIPITOR) tablet 20 mg  20 mg Oral Daily Meyran, Tiana Loft, NP   20 mg at 06/15/18 0926  . dexamethasone (DECADRON) injection 4 mg  4 mg Intravenous Q6H Meyran, Tiana Loft, NP   4 mg at 06/15/18 1046  . diphenhydrAMINE (BENADRYL) capsule 25 mg  25 mg Oral Q6H PRN Coletta Memos, MD      . docusate sodium (COLACE) capsule 100 mg  100 mg  Oral BID Sherryl Manges, NP   100 mg at 06/15/18 0926  . folic acid (FOLVITE) tablet 1 mg  1 mg Oral Daily Meyran, Tiana Loft, NP   1 mg at 06/15/18 0926  . HYDROcodone-acetaminophen (NORCO/VICODIN) 5-325 MG per tablet 1 tablet  1 tablet Oral Q4H PRN Meyran, Tiana Loft, NP   1 tablet at 06/15/18 0311  . irbesartan (AVAPRO) tablet 300 mg  300 mg Oral Daily Meyran, Tiana Loft, NP   300 mg at 06/15/18 0926  . lactated ringers infusion   Intravenous Continuous Meyran, Tiana Loft, NP 50 mL/hr at 06/13/18 0825    . menthol-cetylpyridinium (CEPACOL)  lozenge 3 mg  1 lozenge Oral PRN Meyran, Tiana Loft, NP       Or  . phenol (CHLORASEPTIC) mouth spray 1 spray  1 spray Mouth/Throat PRN Meyran, Tiana Loft, NP      . methocarbamol (ROBAXIN) tablet 500 mg  500 mg Oral Q6H PRN Meyran, Tiana Loft, NP   500 mg at 06/15/18 0926  . morphine 2 MG/ML injection 2 mg  2 mg Intravenous Q4H PRN Meyran, Tiana Loft, NP   2 mg at 06/15/18 1046  . ondansetron (ZOFRAN) tablet 4 mg  4 mg Oral Q6H PRN Meyran, Tiana Loft, NP       Or  . ondansetron (ZOFRAN) injection 4 mg  4 mg Intravenous Q6H PRN Meyran, Tiana Loft, NP      . oxyCODONE (Oxy IR/ROXICODONE) immediate release tablet 10 mg  10 mg Oral Q3H PRN Meyran, Tiana Loft, NP   10 mg at 06/15/18 0925  . pantoprazole (PROTONIX) EC tablet 20 mg  20 mg Oral Daily Meyran, Tiana Loft, NP   20 mg at 06/15/18 0926  . senna (SENOKOT) tablet 8.6 mg  1 tablet Oral BID Sherryl Manges, NP   8.6 mg at 06/15/18 0926  . sodium chloride flush (NS) 0.9 % injection 3 mL  3 mL Intravenous Q12H Meyran, Tiana Loft, NP   3 mL at 06/15/18 0926  . sodium chloride flush (NS) 0.9 % injection 3 mL  3 mL Intravenous PRN Meyran, Tiana Loft, NP      . vitamin B-12 (CYANOCOBALAMIN) tablet 1,000 mcg  1,000 mcg Oral Daily Meyran, Tiana Loft, NP   1,000 mcg at 06/15/18 3825     Discharge Medications: Please see discharge summary for a list of discharge medications.  Relevant Imaging Results:  Relevant Lab Results:   Additional Information SSN: [id number]  Abigail Butts, LCSW

## 2018-06-15 NOTE — Plan of Care (Signed)
Patient stable, discussed POC with patient, HOB flat, agreeable with plan, denies question/concerns at this time. PRN pain rx providing adequate relief.

## 2018-06-15 NOTE — Progress Notes (Signed)
Pt c/o of severe back pain, had Oxy IR 10mg  and Robaxin 500mg  at 2229 with little or no effect, pt still moaning in pain, NP (on call) paged and notified, called back and ordered iv Morphine 2mg  every 4hrs PRN same was clarified and okayed by the 2230 as pt is allergic to Codeine, was however commenced at 0000, pt reassured and will continue to monitor. Obasogie-Asidi, Jedd Schulenburg Efe

## 2018-06-15 NOTE — Progress Notes (Addendum)
NEUROSURGERY PROGRESS NOTE  Doing ok. Having a lot of back pain and right leg pain. Pt is frustrated right now.   Temp:  [98 F (36.7 C)-100 F (37.8 C)] 98.7 F (37.1 C) (10/27 0924) Pulse Rate:  [76-86] 83 (10/27 0924) Resp:  [18] 18 (10/27 0924) BP: (117-139)/(50-68) 117/50 (10/27 0924) SpO2:  [95 %-99 %] 98 % (10/27 0924)  Plan: Will order decadron to hopefully help with her pain  Sherryl Manges, NP 06/15/2018 9:37 AM

## 2018-06-16 MED FILL — Thrombin (Recombinant) For Soln 20000 Unit: CUTANEOUS | Qty: 1 | Status: AC

## 2018-06-16 NOTE — Progress Notes (Signed)
Patient ID: Joy [name], female   DOB: 03-15-1941, 77 y.o.   MRN: [id number] BP (!) 118/51 (BP Location: Right Arm)   Pulse 85   Temp 99.9 F (37.7 C) (Oral) Comment: RN Notified  Resp 16   SpO2 97%  Alert and oriented x 4 Speech is cleAr, and fluent Moving lower extremities well Wound is clean, dry Continue with pt

## 2018-06-16 NOTE — Care Management Important Message (Signed)
Important Message  Patient Details  Name: [name] MRN: [id number] Date of Birth: 1941/07/19   Medicare Important Message Given:  Yes    Dorena Bodo 06/16/2018, 3:41 PM

## 2018-06-17 LAB — TYPE AND SCREEN
ABO/RH(D): A POS
Antibody Screen: NEGATIVE
DONOR AG TYPE: NEGATIVE
Donor AG Type: NEGATIVE
UNIT DIVISION: 0
Unit division: 0

## 2018-06-17 LAB — BPAM RBC
BLOOD PRODUCT EXPIRATION DATE: 201911182359
Blood Product Expiration Date: 201911182359
UNIT TYPE AND RH: 6200
Unit Type and Rh: 6200

## 2018-06-17 NOTE — Progress Notes (Signed)
CSW met with patient and patient's children at bedside. Patient acknowledged feeling frustrated with bed rest orders, but hopeful that she'd start to feel better soon. Patient and family hopeful for a facility near [place]. CSW provided bed offers, and patient chose [facility]. They will be able to take patient when medically stable.  CSW to follow.   , LCSW Clinical Social Worker [phone]  

## 2018-06-17 NOTE — Progress Notes (Signed)
Patient ID: Joy [name], female   DOB: [birth year], 77 y.o.   MRN: [id number] BP (!) 149/78 (BP Location: Right Arm)   Pulse 79   Temp 98.7 F (37.1 C) (Oral)   Resp 16   SpO2 100%  Alert and oriented x 4, speech is clear and fluent Moving all extremities well Wound is clean, dry, no signs of infection Pt tomorrow

## 2018-06-17 NOTE — Consult Note (Signed)
            [facility] CM Primary Care Navigator  06/17/2018  Joy [name] 01/29/1941 [id number]   Seenpatientat the bedsidetoidentify possible discharge needs. Patient reportshaving "worsening pain to lower back radiating to both legs" that had led to this admission/ surgery. 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 Juluis Rainier with [facility] as herprimary care provider.   [facility] [place] [facility].  Patient has beenmanagingher own medicationsat homewith use of"pillbox"systemfilledonce a week.  Patientreports that she has been driving prior to admission/ surgery but daughter Cala [place]), granddaughter Jamal Collin) or grandson Mellody Dance) will be able to provide transportation toher doctors' appointments after discharge.  Patientliveswith her daughter and grandchildren who will serve as herprimary caregivers at home when needed.  Anticipated plan for discharge isskilled nursing facility (SNF)for rehabilitationper therapy recommendation.   Patientvoiced understandingto callprimary care provider's office once she returnshome,for a post discharge follow-up visitwithin1- 2 weeks or sooner if needs arise.Patient letter (with PCP's contact number) was provided asareminder.  Explained topatient about[facility] CM services available for health management andresourcesat homebut she denies needing services at this time. Patient states that she has been independently managing her health conditions so far.   Patient expressedunderstandingto seekreferral from primary care provider to [facility] care management ifdeemed necessary and appropriatefor any servicesin thenear future.  [facility] care management information was provided for future needs thatshe may  have.  Primary care provider's office is listed as providing transition of care (TOC) follow-up.   For additional questions please contact:  Karin Golden A. Earnest Thalman, BSN, RN-BC [facility] PRIMARY CARE Navigator Cell: [phone]

## 2018-06-18 NOTE — Progress Notes (Signed)
Patient ID: Joy [name], female   DOB: 1941/03/05, 77 y.o.   MRN: [id number] BP 123/63 (BP Location: Right Arm)   Pulse 90   Temp 99.3 F (37.4 C) (Oral)   Resp 18   SpO2 100%  Alert and oriented x4, speech is clear and fluent Moving all extremities well Wound is clean, and dry Will obtain rehab consult

## 2018-06-18 NOTE — Progress Notes (Signed)
PT Progress Note for Charges    06/18/18 1700  PT General Charges  $$ ACUTE PT VISIT 1 Visit  PT Treatments  $Gait Training 8-22 mins  $Therapeutic Activity 8-22 mins  Deborah Chalk, [place], DPT  Acute Rehabilitation Services Pager [phone] Office [phone]

## 2018-06-18 NOTE — Progress Notes (Signed)
Occupational Therapy Evaluation Patient Details Name: Joy [name] MRN: [id number] DOB: Jun 26, 1941 Today's Date: 06/18/2018    History of Present Illness Pt is a 77 y/o female s/p L2/L3 lumbar fusion. Pt had previously suffered from stenosis, spondlytic and degenerative disc disease. Apparent bedrest per nsg note10/28. Activity resumed 10/30. PMH includes CPOD, B TKA, HTN, rheumatoid arthritis, prior back sx.   Clinical Impression   Pt sat/stood EOB without complaints of headache/dizziness with min A with stability. Ambulated to bathroom then back to recliner. Pt complaining of intense headache and nausea. BP 100/59; HR 75; O2 98. Pt reclined -BP 124/60. Nsg to room and monitoring pt. Continue to recommend rehab at [facility] when medically stable. Will continue to follow acutely.     Follow Up Recommendations  SNF;Supervision/Assistance - 24 hour    Equipment Recommendations  3 in 1 bedside commode    Recommendations for Other Services       Precautions / Restrictions Precautions Precautions: Fall;Back Precaution Booklet Issued: Yes (comment) Precaution Comments: reviewed precautions with patinet  Required Braces or Orthoses: Spinal Brace Spinal Brace: Lumbar corset;Applied in sitting position      Mobility Bed Mobility Overal bed mobility: Needs Assistance Bed Mobility: Rolling;Sidelying to Sit Rolling: Supervision Sidelying to sit: Min guard          Transfers Overall transfer level: Needs assistance Equipment used: Rolling walker (2 wheeled) Transfers: Sit to/from Stand Sit to Stand: Min guard         General transfer comment: verbal cueing for hand placement during sit to stand. tactile feedback for posture during standing.     Balance Overall balance assessment: Needs assistance   Sitting balance-Leahy Scale: Good       Standing balance-Leahy Scale: Poor Standing balance comment: reliant on B UE support                           ADL either  performed or assessed with clinical judgement   ADL Overall ADL's : Needs assistance/impaired     Grooming: Set up;Sitting   Upper Body Bathing: Set up;Sitting   Lower Body Bathing: Moderate assistance;Sit to/from stand   Upper Body Dressing : Set up;Sitting   Lower Body Dressing: Moderate assistance;Sit to/from stand   Toilet Transfer: Minimal assistance;Ambulation;BSC   Toileting- Clothing Manipulation and Hygiene: Moderate assistance;Sit to/from stand       Functional mobility during ADLs: Minimal assistance;Rolling walker;Cueing for safety General ADL Comments: Pt will need AE for LB ADL     Vision         Perception     Praxis      Pertinent Vitals/Pain Pain Assessment: 0-10 Faces Pain Scale: Hurts even more Pain Location: back; head Pain Descriptors / Indicators: Aching;Headache Pain Intervention(s): Limited activity within patient's tolerance     Hand Dominance     Extremity/Trunk Assessment Upper Extremity Assessment Upper Extremity Assessment: Overall WFL for tasks assessed   Lower Extremity Assessment Lower Extremity Assessment: Defer to PT evaluation       Communication     Cognition Arousal/Alertness: Awake/alert Behavior During Therapy: WFL for tasks assessed/performed Overall Cognitive Status: Within Functional Limits for tasks assessed                                     General Comments       Exercises     Shoulder Instructions  Home Living                                          Prior Functioning/Environment                   OT Problem List:        OT Treatment/Interventions:      OT Goals(Current goals can be found in the care plan section) Acute Rehab OT Goals Patient Stated Goal: to get better and without pain OT Goal Formulation: With patient Time For Goal Achievement: 06/28/18 Potential to Achieve Goals: Good ADL Goals Pt Will Perform Grooming: with modified  independence;standing Pt Will Perform Lower Body Bathing: with modified independence;with adaptive equipment;sitting/lateral leans Pt Will Perform Lower Body Dressing: with modified independence;sit to/from stand;with adaptive equipment Pt Will Transfer to Toilet: with modified independence;ambulating;bedside commode Pt Will Perform Toileting - Clothing Manipulation and hygiene: with modified independence;sit to/from stand;with adaptive equipment  OT Frequency: Min 2X/week   Barriers to D/C:            Co-evaluation              AM-PAC PT "6 Clicks" Daily Activity     Outcome Measure Help from another person eating meals?: None Help from another person taking care of personal grooming?: A Little Help from another person toileting, which includes using toliet, bedpan, or urinal?: A Lot Help from another person bathing (including washing, rinsing, drying)?: A Lot Help from another person to put on and taking off regular upper body clothing?: None Help from another person to put on and taking off regular lower body clothing?: A Lot 6 Click Score: 17   End of Session Equipment Utilized During Treatment: Gait belt;Rolling walker;Back brace Nurse Communication: Mobility status;Other (comment)(patient status)  Activity Tolerance: Other (comment);Treatment limited secondary to medical complications (Comment)(hypotensive) Patient left: in chair;with call bell/phone within reach;Other (comment)(reclined to @ 30 degrees head elevation)  OT Visit Diagnosis: Other abnormalities of gait and mobility (R26.89);Muscle weakness (generalized) (M62.81);Pain Pain - part of body: (back/head)                Time: 6578-4696 OT Time Calculation (min): 33 min Charges:  OT General Charges $OT Visit: 1 Visit OT Treatments $Self Care/Home Management : 23-37 mins  Luisa Dago, OT/L   Acute OT Clinical Specialist Acute Rehabilitation Services Pager [phone] Office  [phone]   [facility] 06/18/2018, 2:17 PM

## 2018-06-18 NOTE — Progress Notes (Signed)
Notified Dr. Franky Macho when patient was standing with PT she got a headache 8/10 pain and became nauseous. After she had sat in the recliner for about 30 minutes her symptoms began to decrease. After and hour she felt back to normal. No new orders will continue to monitor.

## 2018-06-18 NOTE — Progress Notes (Signed)
Physical Therapy Treatment Patient Details Name: Joy [name] MRN: [id number] DOB: July 10, 1941 Today's Date: 06/18/2018    History of Present Illness Pt is a 77 y/o female s/p L2/L3 lumbar fusion on 06/13/2018 . Pt had previously suffered from stenosis, spondlytic and degenerative disc disease. Apparent bedrest 10/28. Activity resumed 10/30. PMH includes CPOD, B TKA, HTN, rheumatoid arthritis, prior back sx.    PT Comments    Pt presented supine, HOB elevated, and alert. Pt was ready and willing to participate in PT. Pt performed log roll with with supervision. Verbal cues given to maintain back precautions. Pt completed sidelying to sit with Min A for trunk stability. Sit to stand was completed with min guard, with no symptoms of headache as reported earlier in the day. Pt ambulated with decreased step length with one misstep with her LLE. Once sitting in chair, pt reported sudden occurrence of headache. Pt stated that this was similar to the one felt earlier in the day. After ~7 minutes, headache remained with slight decrease in symptoms. Pt would benefit from continued skilled PT in order to address ambulation and functional deficits and decreasing pain.         Follow Up Recommendations  SNF;Supervision - Intermittent     Equipment Recommendations  None recommended by PT    Recommendations for Other Services       Precautions / Restrictions Precautions Precautions: Fall;Back Precaution Booklet Issued: No Precaution Comments: reviewed precautions with patinet  Required Braces or Orthoses: Spinal Brace Spinal Brace: Lumbar corset;Applied in sitting position Restrictions Weight Bearing Restrictions: No    Mobility  Bed Mobility Overal bed mobility: Needs Assistance Bed Mobility: Rolling;Sidelying to Sit Rolling: Supervision Sidelying to sit: Min assist       General bed mobility comments: Verbal cueing given for sequencing log roll technique. Min A given for trunk  support sidelying to sit.   Transfers Overall transfer level: Needs assistance Equipment used: Rolling walker (2 wheeled) Transfers: Sit to/from Stand Sit to Stand: Min guard         General transfer comment: verbal cueing for hand placement during sit to stand. tactile feedback for posture during standing.   Ambulation/Gait Ambulation/Gait assistance: Min guard Gait Distance (Feet): 45 Feet Assistive device: Rolling walker (2 wheeled) Gait Pattern/deviations: Decreased step length - right;Decreased step length - left;Decreased stride length   Gait velocity interpretation: <1.31 ft/sec, indicative of household ambulator General Gait Details: Pt required min guard assist for safety during ambulation. Pt made one misstep during gait and regained balance immediately after.    Stairs             Wheelchair Mobility    Modified Rankin (Stroke Patients Only)       Balance Overall balance assessment: Needs assistance Sitting-balance support: No upper extremity supported;Feet supported Sitting balance-Leahy Scale: Good     Standing balance support: Bilateral upper extremity supported;During functional activity Standing balance-Leahy Scale: Poor Standing balance comment: reliant on B UE support                            Cognition Arousal/Alertness: Awake/alert Behavior During Therapy: WFL for tasks assessed/performed Overall Cognitive Status: Within Functional Limits for tasks assessed                                        Exercises  General Comments        Pertinent Vitals/Pain Pain Assessment: 0-10 Pain Score: 7  Faces Pain Scale: Hurts even more Pain Location: back Pain Descriptors / Indicators: Throbbing;Shooting Pain Intervention(s): Monitored during session    Home Living                      Prior Function            PT Goals (current goals can now be found in the care plan section) Acute Rehab PT  Goals Patient Stated Goal: to get better and without pain PT Goal Formulation: With patient Time For Goal Achievement: 06/28/18 Potential to Achieve Goals: Good Progress towards PT goals: Progressing toward goals    Frequency    Min 5X/week      PT Plan Current plan remains appropriate    Co-evaluation              AM-PAC PT "6 Clicks" Daily Activity  Outcome Measure  Difficulty turning over in bed (including adjusting bedclothes, sheets and blankets)?: A Little Difficulty moving from lying on back to sitting on the side of the bed? : A Little Difficulty sitting down on and standing up from a chair with arms (e.g., wheelchair, bedside commode, etc,.)?: Unable Help needed moving to and from a bed to chair (including a wheelchair)?: None Help needed walking in hospital room?: A Little Help needed climbing 3-5 steps with a railing? : Total 6 Click Score: 15    End of Session Equipment Utilized During Treatment: Gait belt;Back brace Activity Tolerance: Patient limited by pain Patient left: in chair;with call bell/phone within reach;with chair alarm set Nurse Communication: Mobility status;Other (comment)(headache upon sitting) PT Visit Diagnosis: Other abnormalities of gait and mobility (R26.89);Unsteadiness on feet (R26.81)     Time: 8563-1497 PT Time Calculation (min) (ACUTE ONLY): 29 min  Charges:  $Gait Training: 8-22 mins $Therapeutic Activity: 8-22 mins                     Joy [name], SPT Acute Rehab [phone] (pager) [phone] (office)   Joy [name] 06/18/2018, 5:33 PM

## 2018-06-19 DIAGNOSIS — R41841 Cognitive communication deficit: Secondary | ICD-10-CM | POA: Diagnosis not present

## 2018-06-19 DIAGNOSIS — M47816 Spondylosis without myelopathy or radiculopathy, lumbar region: Secondary | ICD-10-CM | POA: Diagnosis not present

## 2018-06-19 DIAGNOSIS — G629 Polyneuropathy, unspecified: Secondary | ICD-10-CM | POA: Diagnosis not present

## 2018-06-19 DIAGNOSIS — R278 Other lack of coordination: Secondary | ICD-10-CM | POA: Diagnosis not present

## 2018-06-19 DIAGNOSIS — J449 Chronic obstructive pulmonary disease, unspecified: Secondary | ICD-10-CM | POA: Diagnosis not present

## 2018-06-19 DIAGNOSIS — Z9889 Other specified postprocedural states: Secondary | ICD-10-CM | POA: Diagnosis not present

## 2018-06-19 DIAGNOSIS — J411 Mucopurulent chronic bronchitis: Secondary | ICD-10-CM | POA: Diagnosis not present

## 2018-06-19 DIAGNOSIS — R279 Unspecified lack of coordination: Secondary | ICD-10-CM | POA: Diagnosis not present

## 2018-06-19 DIAGNOSIS — Z4789 Encounter for other orthopedic aftercare: Secondary | ICD-10-CM | POA: Diagnosis not present

## 2018-06-19 DIAGNOSIS — Z789 Other specified health status: Secondary | ICD-10-CM | POA: Diagnosis not present

## 2018-06-19 DIAGNOSIS — N3281 Overactive bladder: Secondary | ICD-10-CM | POA: Diagnosis not present

## 2018-06-19 DIAGNOSIS — M05762 Rheumatoid arthritis with rheumatoid factor of left knee without organ or systems involvement: Secondary | ICD-10-CM | POA: Diagnosis not present

## 2018-06-19 DIAGNOSIS — M48062 Spinal stenosis, lumbar region with neurogenic claudication: Secondary | ICD-10-CM | POA: Diagnosis not present

## 2018-06-19 DIAGNOSIS — Z981 Arthrodesis status: Secondary | ICD-10-CM | POA: Diagnosis not present

## 2018-06-19 DIAGNOSIS — E7800 Pure hypercholesterolemia, unspecified: Secondary | ICD-10-CM | POA: Diagnosis not present

## 2018-06-19 DIAGNOSIS — D62 Acute posthemorrhagic anemia: Secondary | ICD-10-CM | POA: Diagnosis not present

## 2018-06-19 DIAGNOSIS — M4316 Spondylolisthesis, lumbar region: Secondary | ICD-10-CM | POA: Diagnosis not present

## 2018-06-19 DIAGNOSIS — K219 Gastro-esophageal reflux disease without esophagitis: Secondary | ICD-10-CM | POA: Diagnosis not present

## 2018-06-19 DIAGNOSIS — I10 Essential (primary) hypertension: Secondary | ICD-10-CM | POA: Diagnosis not present

## 2018-06-19 DIAGNOSIS — R2681 Unsteadiness on feet: Secondary | ICD-10-CM | POA: Diagnosis not present

## 2018-06-19 DIAGNOSIS — E441 Mild protein-calorie malnutrition: Secondary | ICD-10-CM | POA: Diagnosis not present

## 2018-06-19 DIAGNOSIS — M6281 Muscle weakness (generalized): Secondary | ICD-10-CM | POA: Diagnosis not present

## 2018-06-19 DIAGNOSIS — H04123 Dry eye syndrome of bilateral lacrimal glands: Secondary | ICD-10-CM | POA: Diagnosis not present

## 2018-06-19 DIAGNOSIS — E785 Hyperlipidemia, unspecified: Secondary | ICD-10-CM | POA: Diagnosis not present

## 2018-06-19 DIAGNOSIS — Z743 Need for continuous supervision: Secondary | ICD-10-CM | POA: Diagnosis not present

## 2018-06-19 DIAGNOSIS — R471 Dysarthria and anarthria: Secondary | ICD-10-CM | POA: Diagnosis not present

## 2018-06-19 DIAGNOSIS — J439 Emphysema, unspecified: Secondary | ICD-10-CM | POA: Diagnosis not present

## 2018-06-19 DIAGNOSIS — M479 Spondylosis, unspecified: Secondary | ICD-10-CM | POA: Diagnosis not present

## 2018-06-19 DIAGNOSIS — R52 Pain, unspecified: Secondary | ICD-10-CM | POA: Diagnosis not present

## 2018-06-19 MED ORDER — HYDROCODONE-ACETAMINOPHEN 5-325 MG PO TABS: 1.0000 | ORAL_TABLET | Freq: Four times a day (QID) | ORAL | 0 refills | Status: DC | PRN

## 2018-06-19 NOTE — Clinical Social Work Placement (Signed)
Nurse to call report to [phone], Room 104     CLINICAL SOCIAL WORK PLACEMENT  NOTE  Date:  06/19/2018  Patient Details  Name: Joy [name] MRN: [id number] Date of Birth: 05/07/1941  Clinical Social Work is seeking post-discharge placement for this patient at the Skilled  Nursing Facility level of care (*CSW will initial, date and re-position this form in  chart as items are completed):  Yes   Patient/family provided with [facility] Clinical Social Work Department's list of facilities offering this level of care within the geographic area requested by the patient (or if unable, by the patient's family).  Yes   Patient/family informed of their freedom to choose among providers that offer the needed level of care, that participate in Medicare, Medicaid or managed care program needed by the patient, have an available bed and are willing to accept the patient.  Yes   Patient/family informed of [facility]'s ownership interest in [facility] and [facility], as well as of the fact that they are under no obligation to receive care at these facilities.  PASRR submitted to EDS on       PASRR number received on       Existing PASRR number confirmed on       FL2 transmitted to all facilities in geographic area requested by pt/family on       FL2 transmitted to all facilities within larger geographic area on       Patient informed that his/her managed care company has contracts with or will negotiate with certain facilities, including the following:        Yes   Patient/family informed of bed offers received.  Patient chooses bed at [facility]     Physician recommends and patient chooses bed at      Patient to be transferred to [facility] on 06/19/18.  Patient to be transferred to facility by [facility]     Patient family notified on 06/19/18 of transfer.  Name of family member notified:        PHYSICIAN       Additional Comment:     _______________________________________________ Baldemar Lenis, LCSW 06/19/2018, 4:16 PM

## 2018-06-19 NOTE — Progress Notes (Signed)
Physical Therapy Treatment Patient Details Name: Joy [name] MRN: [id number] DOB: December 03, 1940 Today's Date: 06/19/2018    History of Present Illness Pt is a 77 y/o female s/p L2/L3 lumbar fusion on 06/13/2018 . Pt had previously suffered from stenosis, spondlytic and degenerative disc disease. Apparent bedrest 10/28. Activity resumed 10/30. PMH includes CPOD, B TKA, HTN, rheumatoid arthritis, prior back sx.    PT Comments    Pt presented OOB in a chair, alert and willing to participate in PT. Pt continues to be min A for transfers and min guard for ambulation with RW. Pt was able to increase walking distance to ~125 ft today with increased fatigue around 100 ft. Upon returning to the room and sitting back in the chair, pt experienced headache which pt stated was worse than on 06/18/2018. Pt requested to return back to the bed secondary to pain, where she was left in L sidelying for comfort. Pt would benefit from continued PT in order to increase activity tolerance, functional abilities and gait speed.     Follow Up Recommendations  SNF;Supervision - Intermittent     Equipment Recommendations  None recommended by PT    Recommendations for Other Services       Precautions / Restrictions Precautions Precautions: Fall;Back Precaution Booklet Issued: No Precaution Comments: reviewed precautions with patinet  Required Braces or Orthoses: Spinal Brace Spinal Brace: Lumbar corset;Applied in sitting position Restrictions Weight Bearing Restrictions: No    Mobility  Bed Mobility Overal bed mobility: Needs Assistance Bed Mobility: Rolling;Sit to Sidelying Rolling: Supervision       Sit to sidelying: Min assist General bed mobility comments: Verbal cueing to maintain log roll technique. BLE supported for sit to sidelying transition   Transfers Overall transfer level: Needs assistance Equipment used: Rolling walker (2 wheeled) Transfers: Sit to/from Stand Sit to Stand: Min  assist         General transfer comment: verbal cueing given for hand placement. Min assist for power and stability into standing  Ambulation/Gait Ambulation/Gait assistance: Min guard Gait Distance (Feet): 125 Feet Assistive device: Rolling walker (2 wheeled) Gait Pattern/deviations: Decreased step length - right;Decreased step length - left;Decreased stride length   Gait velocity interpretation: 1.31 - 2.62 ft/sec, indicative of limited community ambulator General Gait Details: Pt required min guard assist for safety during ambulation. Pt reported increased fatigue ~100 ft   Stairs             Wheelchair Mobility    Modified Rankin (Stroke Patients Only)       Balance Overall balance assessment: Needs assistance Sitting-balance support: No upper extremity supported;Feet supported Sitting balance-Leahy Scale: Good Sitting balance - Comments: pt able to tolerate sitting balance.   Standing balance support: Bilateral upper extremity supported;During functional activity Standing balance-Leahy Scale: Fair                              Cognition Arousal/Alertness: Awake/alert Behavior During Therapy: WFL for tasks assessed/performed Overall Cognitive Status: Within Functional Limits for tasks assessed                                        Exercises      General Comments        Pertinent Vitals/Pain Pain Assessment: Faces Faces Pain Scale: Hurts even more Pain Location: back Pain Descriptors / Indicators: Throbbing;Shooting Pain  Intervention(s): Monitored during session    Home Living                      Prior Function            PT Goals (current goals can now be found in the care plan section) Acute Rehab PT Goals Patient Stated Goal: to get better and without pain PT Goal Formulation: With patient Time For Goal Achievement: 06/28/18 Potential to Achieve Goals: Good Progress towards PT goals: Progressing  toward goals    Frequency    Min 5X/week      PT Plan Current plan remains appropriate    Co-evaluation              AM-PAC PT "6 Clicks" Daily Activity  Outcome Measure  Difficulty turning over in bed (including adjusting bedclothes, sheets and blankets)?: A Little Difficulty moving from lying on back to sitting on the side of the bed? : A Little Difficulty sitting down on and standing up from a chair with arms (e.g., wheelchair, bedside commode, etc,.)?: Unable Help needed moving to and from a bed to chair (including a wheelchair)?: A Little Help needed walking in hospital room?: A Little Help needed climbing 3-5 steps with a railing? : Total 6 Click Score: 14    End of Session Equipment Utilized During Treatment: Gait belt;Back brace Activity Tolerance: Patient limited by fatigue Patient left: in bed;with call bell/phone within reach;with bed alarm set Nurse Communication: Mobility status(increased headache ) PT Visit Diagnosis: Other abnormalities of gait and mobility (R26.89);Unsteadiness on feet (R26.81)     Time: 1330-1403 PT Time Calculation (min) (ACUTE ONLY): 33 min  Charges:  $Gait Training: 8-22 mins $Therapeutic Activity: 8-22 mins                     Rolm Bookbinder, SPT Acute Rehab [phone] (pager) [phone] (office)   Gustie Bobb 06/19/2018, 4:04 PM

## 2018-06-19 NOTE — Discharge Instructions (Signed)

## 2018-06-19 NOTE — Progress Notes (Signed)
PT Progress Note for Charges    06/19/18 1600  PT General Charges  $$ ACUTE PT VISIT 1 Visit  PT Treatments  $Gait Training 8-22 mins  $Therapeutic Activity 8-22 mins  Deborah Chalk, [place], DPT  Acute Rehabilitation Services Pager [phone] Office [phone]

## 2018-06-19 NOTE — Discharge Summary (Signed)
Physician Discharge Summary  Patient ID: Joy [name] MRN: [id number] DOB/AGE: 1940/11/06 77 y.o.  Admit date: 06/13/2018 Discharge date: 06/19/2018  Admission Diagnoses:adjacent segment disease lumbar spine  Discharge Diagnoses:  Active Problems:   Spondylosis   Status post lumbar spinal fusion   Status post lumbar spine surgery for decompression of spinal cord   Discharged Condition: good  Hospital Course: [name] was admitted and taken to the operating room for an uncomplicated decompression and arthrodesis. She did have a dural tear which was repaired with patching, but there was nothing to sew too. Her wound has remained dry, She is ambulating, voiding, and tolerating a regular diet.  Treatments: surgery: Laminectomy L2 to decompress the L2, and L3 roots Posterolateral arthrodesis L2/3 Pedicle screw fixation L2/3  Discharge Exam: Blood pressure 138/62, pulse 71, temperature 98.2 F (36.8 C), resp. rate 16, SpO2 98 %. General appearance: alert, cooperative, appears stated age and mild distress Neurologic: Alert and oriented X 3, normal strength and tone. Normal symmetric reflexes. Normal coordination and gait  Disposition:  Spinal stenosis, Lumbar region with neurogenic claudication Discharge Instructions    Incentive spirometry RT   Complete by:  As directed      Allergies as of 06/19/2018      Reactions   Codeine Other (See Comments)   Lump in throat   Tape Hives, Rash   Paper tape only   Latex Rash   Nickel Rash   Bumps Also other metals      Medication List    STOP taking these medications   naproxen sodium 220 MG tablet Commonly known as:  ALEVE     TAKE these medications   albuterol 108 (90 Base) MCG/ACT inhaler Commonly known as:  PROVENTIL HFA;VENTOLIN HFA Inhale 2 puffs into the lungs every 6 (six) hours as needed for wheezing or shortness of breath.   atorvastatin 20 MG tablet Commonly known as:  LIPITOR Take 20 mg by mouth daily.    benzonatate 100 MG capsule Commonly known as:  TESSALON Take 1 capsule (100 mg total) by mouth 3 (three) times daily as needed for cough.   BIOTIN PO Take 800 mcg by mouth daily.   diphenhydrAMINE 25 MG tablet Commonly known as:  BENADRYL Take 25 mg by mouth every 6 (six) hours as needed for allergies.   folic acid 1 MG tablet Commonly known as:  FOLVITE Take 1 mg by mouth daily.   gabapentin 300 MG capsule Commonly known as:  NEURONTIN Take 300 mg by mouth at bedtime.   HYDROmorphone 2 MG tablet Commonly known as:  DILAUDID Take 1-2 tablets (2-4 mg total) by mouth every 4 (four) hours as needed for moderate pain or severe pain.   irbesartan 300 MG tablet Commonly known as:  AVAPRO Take 300 mg by mouth daily.   Magnesium 250 MG Tabs Take 250 mg by mouth daily.   methocarbamol 500 MG tablet Commonly known as:  ROBAXIN Take 1 tablet (500 mg total) by mouth every 6 (six) hours as needed for muscle spasms.   methotrexate 1 g injection Commonly known as:  50 mg/ml Inject 25 mg into the vein every 7 (seven) days.   OVER THE COUNTER MEDICATION Apply 1 application topically daily as needed (pain).   pantoprazole 40 MG tablet Commonly known as:  PROTONIX Take 40 mg by mouth daily.   predniSONE 20 MG tablet Commonly known as:  DELTASONE Take 2 tablets (40 mg total) by mouth daily with breakfast.   REMICADE  100 MG injection Generic drug:  inFLIXimab Inject 100 mg into the vein.   rivaroxaban 10 MG Tabs tablet Commonly known as:  XARELTO Take 1 tablet (10 mg total) by mouth daily with breakfast. Take Xarelto for two and a half more weeks, then discontinue Xarelto. Once the patient has completed the blood thinner regimen, then take a Baby 81 mg Aspirin daily for three more weeks.   SYSTANE OP Place 1 drop into both eyes 3 (three) times daily.   Tiotropium Bromide Monohydrate 2.5 MCG/ACT Aers Inhale 2 puffs into the lungs daily for 1 day.   traMADol 50 MG  tablet Commonly known as:  ULTRAM Take 1-2 tablets (50-100 mg total) by mouth every 6 (six) hours as needed (mild pain).   VITAMIN A PO Take 2,400 Units by mouth daily.   vitamin B-12 1000 MCG tablet Commonly known as:  CYANOCOBALAMIN Take 1,000 mcg by mouth daily.   Vitamin D3 1000 units Caps Take 1,000 Units by mouth daily.       Contact information for follow-up providers    Coletta Memos, MD Follow up in 3 week(s).   Specialty:  Neurosurgery Why:  please call to make an appointment Contact information: [address] [place] [address] [phone]            Contact information for after-discharge care    Destination    [facility] Preferred SNF .   Service:  Skilled Nursing Contact information: [address] [place] [address] [phone]                  Signed: Carmela Hurt 06/19/2018, 2:53 PM

## 2018-06-20 ENCOUNTER — Non-Acute Institutional Stay (SKILLED_NURSING_FACILITY): Payer: Medicare Other | Admitting: Internal Medicine

## 2018-06-20 ENCOUNTER — Encounter: Payer: Self-pay | Admitting: Internal Medicine

## 2018-06-20 DIAGNOSIS — G629 Polyneuropathy, unspecified: Secondary | ICD-10-CM

## 2018-06-20 DIAGNOSIS — M47816 Spondylosis without myelopathy or radiculopathy, lumbar region: Secondary | ICD-10-CM

## 2018-06-20 DIAGNOSIS — M48062 Spinal stenosis, lumbar region with neurogenic claudication: Secondary | ICD-10-CM

## 2018-06-20 DIAGNOSIS — Z981 Arthrodesis status: Secondary | ICD-10-CM

## 2018-06-20 DIAGNOSIS — M479 Spondylosis, unspecified: Secondary | ICD-10-CM

## 2018-06-20 DIAGNOSIS — E785 Hyperlipidemia, unspecified: Secondary | ICD-10-CM | POA: Diagnosis not present

## 2018-06-20 DIAGNOSIS — I10 Essential (primary) hypertension: Secondary | ICD-10-CM

## 2018-06-20 DIAGNOSIS — M05762 Rheumatoid arthritis with rheumatoid factor of left knee without organ or systems involvement: Secondary | ICD-10-CM

## 2018-06-20 DIAGNOSIS — Z9889 Other specified postprocedural states: Secondary | ICD-10-CM

## 2018-06-20 DIAGNOSIS — D62 Acute posthemorrhagic anemia: Secondary | ICD-10-CM | POA: Diagnosis not present

## 2018-06-20 NOTE — Progress Notes (Signed)
:  Location:  [facility] Room Number: 104P Place of Service:  SNF (31)  Joy Segel D. [name], MD  Patient Care Team: Juluis Rainier, MD as PCP - [facility])  Extended Emergency Contact Information Primary Emergency Contact: Thal,Kimberly Address: [address]          [place], [place] [address] [place] of [place] Home Phone: [phone] Mobile Phone: [phone] Relation: Daughter     Allergies: Codeine; Tape; Latex; and Nickel  Chief Complaint  Patient presents with  . New Admit To SNF    Admit to [facility]    HPI: Patient is 77 y.o. female with ankylosing spondylitis, rheumatoid arthritis, COPD, hypertension, GERD, hyperlipidemia, hypertension, overactive bladder, neuropathy, who was admitted from 10/25-10/31 for spinal stenosis of the lumbar region with neurogenic claudication for decompression surgery.  Patient underwent a laminectomy of L2 to decompress the L2 and L3 roots, posterior lateral arthrodesis L2/3 and pedicle screw fixation L2/3.  She did have a dural tear which was repaired with patching but there is nothing to sew it to.  Patient is admitted to skilled nursing facility for OT/PT.  While at skilled nursing patient will be followed for hypertension treated with Avapro 300 mg daily, polyneuropathy treated with Neurontin and rheumatoid arthritis treated with methotrexate Remicade and prednisone.  Past Medical History:  Diagnosis Date  . Ankylosing spondylitis (HCC)   . Arthritis    RHEUMATOID  . Bronchitis   . COPD (chronic obstructive pulmonary disease) (HCC)    CXR 01/11/18 showed mild COPD and chronic bronchitis  . CTS (carpal tunnel syndrome)   . Dysrhythmia    "irregularity" unknown at this time - being evaluated by cardiology  . Essential hypertension 11/24/2015  . GERD (gastroesophageal reflux disease)   . Hypercholesteremia   . Hyperlipidemia 11/24/2015  . Hypertension   . IBS (irritable bowel  syndrome)   . Neuropathy   . OAB (overactive bladder)   . Osteoarthritis   . Osteoporosis   . RA (rheumatoid arthritis) (HCC)   . Rheumatoid arthritis (HCC) 11/24/2015  . Seasonal allergies     Past Surgical History:  Procedure Laterality Date  . ABDOMINAL HYSTERECTOMY  1995  . BACK SURGERY    . BREAST SURGERY     REDUCTION  . CATARACT EXTRACTION W/PHACO  08/01/2012   Procedure: CATARACT EXTRACTION PHACO AND INTRAOCULAR LENS PLACEMENT (IOC);  Surgeon: Chalmers Guest, MD;  Location: [facility] OR;  Service: Ophthalmology;  Laterality: Right;  . HERNIA REPAIR     RIGHT ING.  . JOINT REPLACEMENT  2014   rt total knee  . TONSILLECTOMY    . 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 Always use your most recent med list.          albuterol 108 (90 Base) MCG/ACT inhaler Commonly known as:  PROVENTIL HFA;VENTOLIN HFA Inhale 2 puffs into the lungs every 6 (six) hours as needed for wheezing or shortness of breath.   atorvastatin 20  MG tablet Commonly known as:  LIPITOR Take 20 mg by mouth daily.   benzonatate 100 MG capsule Commonly known as:  TESSALON Take 1 capsule (100 mg total) by mouth 3 (three) times daily as needed for cough.   BIOTIN PO Take 800 mcg by mouth daily.   diphenhydrAMINE 25 MG tablet Commonly known as:  BENADRYL Take 25 mg by mouth every 6 (six) hours as needed for allergies.   folic acid 1 MG tablet Commonly known as:  FOLVITE Take 1 mg by mouth daily.   gabapentin 300 MG capsule Commonly known as:  NEURONTIN Take 300 mg by mouth at bedtime.   HYDROcodone-acetaminophen 5-325 MG  tablet Commonly known as:  NORCO/VICODIN Take 1 tablet by mouth every 6 (six) hours as needed for moderate pain.   HYDROmorphone 2 MG tablet Commonly known as:  DILAUDID Take 1-2 tablets (2-4 mg total) by mouth every 4 (four) hours as needed for moderate pain or severe pain.   irbesartan 300 MG tablet Commonly known as:  AVAPRO Take 300 mg by mouth daily.   Magnesium 250 MG Tabs Take 250 mg by mouth daily.   methocarbamol 500 MG tablet Commonly known as:  ROBAXIN Take 1 tablet (500 mg total) by mouth every 6 (six) hours as needed for muscle spasms.   methotrexate 1 g injection Commonly known as:  50 mg/ml Inject 25 mg into the vein every 7 (seven) days.   OVER THE COUNTER MEDICATION Apply 1 application topically daily as needed (pain).   pantoprazole 40 MG tablet Commonly known as:  PROTONIX Take 40 mg by mouth daily.   predniSONE 20 MG tablet Commonly known as:  DELTASONE Take 2 tablets (40 mg total) by mouth daily with breakfast.   REMICADE 100 MG injection Generic drug:  inFLIXimab Inject 100 mg into the vein.   rivaroxaban 10 MG Tabs tablet Commonly known as:  XARELTO Take 1 tablet (10 mg total) by mouth daily with breakfast. Take Xarelto for two and a half more weeks, then discontinue Xarelto. Once the patient has completed the blood thinner regimen, then take a Baby 81 mg Aspirin daily for three more weeks.   SYSTANE OP Place 1 drop into both eyes 3 (three) times daily.   Tiotropium Bromide Monohydrate 2.5 MCG/ACT Aers Inhale 2 puffs into the lungs daily for 1 day.   traMADol 50 MG tablet Commonly known as:  ULTRAM Take 1-2 tablets (50-100 mg total) by mouth every 6 (six) hours as needed (mild pain).   VITAMIN A PO Take 2,400 Units by mouth daily.   vitamin B-12 1000 MCG tablet Commonly known as:  CYANOCOBALAMIN Take 1,000 mcg by mouth daily.   Vitamin D3 1000 units Caps Take 1,000 Units by mouth daily.       No orders of the defined types were  placed in this encounter.   Immunization History  Administered Date(s) Administered  . Influenza, High Dose Seasonal PF 05/20/2018    Social History   Tobacco Use  . Smoking status: Former Smoker    Packs/day: 1.00    Years: 35.00    Pack years: 35.00    Last attempt to quit: 05/29/1995    Years since quitting: 23.0  . Smokeless tobacco: Never Used  Substance Use Topics  . Alcohol use: Yes    Comment: rare    Family history is   Family History  Problem Relation Age of Onset  . Heart disease Mother   . Cerebral aneurysm Mother   .  Heart attack Father   . Heart disease Father   . Hypertension Sister   . Arthritis Sister   . Stroke Sister   . Hypertension Sister   . Arthritis Sister   . Hypertension Sister   . Arthritis Sister   . Hypertension Sister   . Hypertension Sister       Review of Systems  DATA OBTAINED: from patient, nurse GENERAL:  no fevers, fatigue, appetite changes SKIN: No itching, or rash EYES: No eye pain, redness, discharge EARS: No earache, tinnitus, change in hearing NOSE: No congestion, drainage or bleeding  MOUTH/THROAT: No mouth or tooth pain, No sore throat RESPIRATORY: No cough, wheezing, SOB CARDIAC: No chest pain, palpitations, lower extremity edema  GI: No abdominal pain, No N/V/D or constipation, No heartburn or reflux  GU: No dysuria, frequency or urgency, or incontinence  MUSCULOSKELETAL: No unrelieved bone/joint pain NEUROLOGIC: No headache, dizziness or focal weakness PSYCHIATRIC: No c/o anxiety or sadness   Vitals:   06/20/18 0856  BP: (!) 167/74  Pulse: 79  Resp: 18  Temp: 97.8 F (36.6 C)  SpO2: 93%    SpO2 Readings from Last 1 Encounters:  06/20/18 93%   Body mass index is 36.81 kg/m.     Physical Exam  GENERAL APPEARANCE: Alert, conversant,  No acute distress.;  Looks incredibly good SKIN: No diaphoresis rash HEAD: Normocephalic, atraumatic  EYES: Conjunctiva/lids clear. Pupils round, reactive. EOMs  intact.  EARS: External exam WNL, canals clear. Hearing grossly normal.  NOSE: No deformity or discharge.  MOUTH/THROAT: Lips w/o lesions  RESPIRATORY: Breathing is even, unlabored. Lung sounds are clear   CARDIOVASCULAR: Heart RRR no murmurs, rubs or gallops. No peripheral edema.   GASTROINTESTINAL: Abdomen is soft, non-tender, not distended w/ normal bowel sounds. GENITOURINARY: Bladder non tender, not distended  MUSCULOSKELETAL: No abnormal joints or musculature NEUROLOGIC:  Cranial nerves 2-12 grossly intact. Moves all extremities  PSYCHIATRIC: Mood and affect appropriate to situation, no behavioral issues  Patient Active Problem List   Diagnosis Date Noted  . Spondylosis 06/13/2018  . Status post lumbar spinal fusion 06/13/2018  . Status post lumbar spine surgery for decompression of spinal cord 06/13/2018  . OA (osteoarthritis) of knee 12/26/2015  . Essential hypertension 11/24/2015  . Hyperlipidemia 11/24/2015  . Rheumatoid arthritis (HCC) 11/24/2015  . Lumbar spondylosis 07/21/2014      Labs reviewed: Basic Metabolic Panel:    Component Value Date/Time   NA 138 06/13/2018 1453   K 3.6 06/13/2018 1453   CL 108 06/04/2018 1342   CO2 27 06/04/2018 1342   GLUCOSE 186 (H) 06/13/2018 1453   BUN 11 06/04/2018 1342   CREATININE 0.89 06/04/2018 1342   CALCIUM 9.8 06/04/2018 1342   PROT 6.4 (L) 12/16/2015 1020   ALBUMIN 3.9 12/16/2015 1020   AST 26 12/16/2015 1020   ALT 16 12/16/2015 1020   ALKPHOS 76 12/16/2015 1020   BILITOT 0.5 12/16/2015 1020   GFRNONAA >60 06/04/2018 1342   GFRAA >60 06/04/2018 1342    Recent Labs    06/04/18 1342 06/13/18 1453  NA 139 138  K 4.0 3.6  CL 108  --   CO2 27  --   GLUCOSE 98 186*  BUN 11  --   CREATININE 0.89  --   CALCIUM 9.8  --    Liver Function Tests: No results for input(s): AST, ALT, ALKPHOS, BILITOT, PROT, ALBUMIN in the last 8760 hours. No results for input(s): LIPASE, AMYLASE in the last 8760 hours. No results  for input(s): AMMONIA in the last 8760 hours. CBC: Recent Labs    06/04/18 1342 06/13/18 1453  WBC 6.7  --   HGB 12.8 9.9*  HCT 40.1 29.0*  MCV 97.1  --   PLT 311  --    Lipid No results for input(s): CHOL, HDL, LDLCALC, TRIG in the last 8760 hours.  Cardiac Enzymes: No results for input(s): CKTOTAL, CKMB, CKMBINDEX, TROPONINI in the last 8760 hours. BNP: No results for input(s): BNP in the last 8760 hours. No results found for: MICROALBUR No results found for: HGBA1C No results found for: TSH No results found for: VITAMINB12 No results found for: FOLATE No results found for: IRON, TIBC, FERRITIN  Imaging and Procedures obtained prior to SNF admission: No results found.   Not all labs, radiology exams or other studies done during hospitalization come through on my EPIC note; however they are reviewed by me.    Assessment and Plan  Spinal stenosis, lumbar region with neurogenic claudication/status post laminectomy, decompression arthrodesis and pedicle screw fixation of L2/3- complicated by a dural tear which was repaired with patching but there is nothing to sew it to SNF- patient is admitted for OT/PT patient has been prophylaxed with Xarelto 10 mg daily for 2-1/2 more weeks from discharge them back to ASA 81 mg for 3 more weeks  Acute blood loss anemia, postop SNF-we will follow-up CBC  Hypertension SNF- continue Avapro 300 mg daily  Rheumatoid arthritis SNF- continue methotrexate 25 mg into the vein every 7 days, prednisone 40 mg daily and Remicade 100 mg into the vein-patient is going to talk to her rheumatologist about her medications since she normally does them at the office  Polyneuropathy SNF- continue Neurontin 300 mg nightly  Time spent greater than 45 minutes;> 50% of time with patient was spent reviewing records, labs, tests and studies, counseling and developing plan of care  Thurston Hole D. [name], MD

## 2018-06-21 ENCOUNTER — Other Ambulatory Visit: Payer: Self-pay | Admitting: Internal Medicine

## 2018-06-21 ENCOUNTER — Encounter: Payer: Self-pay | Admitting: Internal Medicine

## 2018-06-21 DIAGNOSIS — D62 Acute posthemorrhagic anemia: Secondary | ICD-10-CM | POA: Insufficient documentation

## 2018-06-21 DIAGNOSIS — G629 Polyneuropathy, unspecified: Secondary | ICD-10-CM | POA: Insufficient documentation

## 2018-06-21 DIAGNOSIS — M48062 Spinal stenosis, lumbar region with neurogenic claudication: Secondary | ICD-10-CM | POA: Insufficient documentation

## 2018-06-25 LAB — CBC AND DIFFERENTIAL
HCT: 35 — AB (ref 36–46)
Hemoglobin: 11.7 — AB (ref 12.0–16.0)
Platelets: 432 — AB (ref 150–399)
WBC: 11.9

## 2018-06-25 LAB — BASIC METABOLIC PANEL
BUN: 19 (ref 4–21)
Creatinine: 0.8 (ref 0.5–1.1)
GLUCOSE: 78
POTASSIUM: 4.2 (ref 3.4–5.3)
SODIUM: 138 (ref 137–147)

## 2018-07-01 NOTE — Op Note (Signed)
06/13/2018  9:23 AM  PATIENT:  Joy [name]  77 y.o. 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  BLOOD ADMINISTERED:none  CELL SAVER GIVEN:none  COUNT:per nursing  DRAINS: none   SPECIMEN:  No Specimen  DICTATION: [name] is a 77 y.o. female whom was taken to the operating room intubated, and placed under a general anesthetic without difficulty. A foley catheter was placed under sterile conditions. She was positioned prone on a Jackson stable with all pressure points properly padded.  Her lumbar region was prepped and draped in a sterile manner. I infiltrated 20cc's 1/2%lidocaine/1:2000,000 strength epinephrine into the planned incision. I opened the skin with a 10 blade and took the incision down to the thoracolumbar fascia. I exposed the lamina of L1,2,and L3 in a subperiosteal fashion bilaterally. I also exposed the rostral portion of her construct at L3, and L4.  I confirmed my location with an intraoperative xray.  I placed self retaining retractors and started the decompression.  I decompressed the spinal canal at L2/3 via a complete laminectomy of L2, and removed residual lamina of L3. The dura was densely adherentt to the ligamentum flavum and bone. Thus in the process of decompressing the canal the dura simply tore and left the arachnoid. While she did spill csf, the arachnoid did hold in many places where the dura was removed.  I placed duragen over the exposed arachnoid and applied a dural sealant.   I decorticated the lateral bone at L2, and L3. I then placed both auto, and allograft on the decorticated surfaces to complete the posterolateral arthrodesis.  I placed pedicle screws at L2 and connected them to the existing construct, using fluoroscopic guidance. I drilled a pilot hole, then cannulated the pedicle with a bone probe and drill at each site. I then tapped each pedicle, assessing each site for pedicle violations. No cutouts were appreciated. Screws (medtronic) were then placed at each site without difficulty. I attached rods and locking caps with the appropriate tools. The locking caps were secured with torque limited screwdrivers. Final films were performed and the final construct appeared to be in good position.  I closed the wound in a layered fashion. I approximated the thoracolumbar fascia, subcutaneous, and subcuticular planes with vicryl sutures. I closed the skin with a running nylon suture. I used an occlusive bandage for a sterile dressing.     PLAN OF CARE: Admit to inpatient   PATIENT DISPOSITION:  PACU - hemodynamically stable.   Delay start of Pharmacological VTE agent (>24hrs) due to surgical blood loss or risk of bleeding:  yes

## 2018-07-07 ENCOUNTER — Encounter: Payer: Self-pay | Admitting: Internal Medicine

## 2018-07-07 ENCOUNTER — Non-Acute Institutional Stay (SKILLED_NURSING_FACILITY): Payer: Medicare Other | Admitting: Internal Medicine

## 2018-07-07 DIAGNOSIS — M48062 Spinal stenosis, lumbar region with neurogenic claudication: Secondary | ICD-10-CM | POA: Diagnosis not present

## 2018-07-07 DIAGNOSIS — Z789 Other specified health status: Secondary | ICD-10-CM | POA: Diagnosis not present

## 2018-07-07 DIAGNOSIS — R52 Pain, unspecified: Secondary | ICD-10-CM | POA: Diagnosis not present

## 2018-07-07 DIAGNOSIS — G629 Polyneuropathy, unspecified: Secondary | ICD-10-CM

## 2018-07-07 DIAGNOSIS — M47816 Spondylosis without myelopathy or radiculopathy, lumbar region: Secondary | ICD-10-CM | POA: Diagnosis not present

## 2018-07-07 DIAGNOSIS — E441 Mild protein-calorie malnutrition: Secondary | ICD-10-CM

## 2018-07-07 DIAGNOSIS — Z981 Arthrodesis status: Secondary | ICD-10-CM

## 2018-07-07 DIAGNOSIS — H04123 Dry eye syndrome of bilateral lacrimal glands: Secondary | ICD-10-CM | POA: Diagnosis not present

## 2018-07-07 DIAGNOSIS — D62 Acute posthemorrhagic anemia: Secondary | ICD-10-CM

## 2018-07-07 DIAGNOSIS — J439 Emphysema, unspecified: Secondary | ICD-10-CM | POA: Diagnosis not present

## 2018-07-07 DIAGNOSIS — Z9889 Other specified postprocedural states: Secondary | ICD-10-CM

## 2018-07-07 DIAGNOSIS — M479 Spondylosis, unspecified: Secondary | ICD-10-CM | POA: Diagnosis not present

## 2018-07-07 DIAGNOSIS — I10 Essential (primary) hypertension: Secondary | ICD-10-CM

## 2018-07-07 DIAGNOSIS — J411 Mucopurulent chronic bronchitis: Secondary | ICD-10-CM | POA: Diagnosis not present

## 2018-07-07 DIAGNOSIS — M05762 Rheumatoid arthritis with rheumatoid factor of left knee without organ or systems involvement: Secondary | ICD-10-CM

## 2018-07-07 NOTE — Progress Notes (Signed)
Location:  [facility] Room Number: 104P Place of Service:  SNF (31)  Joy [name]. Joy Hollingshead, MD  Patient Care Team: Juluis Rainier, MD as PCP - [facility])  Extended Emergency Contact Information Primary Emergency Contact: Punt,Kimberly Address: [address]          [place], [place] [address] Darden [place] of [place] Home Phone: [phone] Mobile Phone: [phone] Relation: Daughter  Allergies  Allergen Reactions  . Codeine Other (See Comments)    Lump in throat  . Tape Hives and Rash    Paper tape only  . Latex Rash  . Nickel Rash    Bumps Also other metals    Chief Complaint  Patient presents with  . Discharge Note    Discharge from [facility]    HPI:  77 y.o. 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  Patient underwent a laminectomy of L2 to decompress the L2 and L3 roots, posterior lateral arthrodesis of L2/3 and pedicle screw fixation of L2/3.  She did have a dural tear which was repaired with patching but there was nothing to sew it to.  Patient was admitted to skilled nursing facility for OT/PT and is now ready to be discharged to home.    Past Medical History:  Diagnosis Date  . Ankylosing spondylitis (HCC)   . Arthritis    RHEUMATOID  . Bronchitis   . COPD (chronic obstructive pulmonary disease) (HCC)    CXR 01/11/18 showed mild COPD and chronic bronchitis  . CTS (carpal tunnel syndrome)   . Dysrhythmia    "irregularity" unknown at this time - being evaluated by cardiology  . Essential hypertension 11/24/2015  . GERD (gastroesophageal reflux disease)   . Hypercholesteremia   . Hyperlipidemia 11/24/2015  . Hypertension   . IBS (irritable bowel syndrome)   . Neuropathy   . OAB  (overactive bladder)   . Osteoarthritis   . Osteoporosis   . RA (rheumatoid arthritis) (HCC)   . Rheumatoid arthritis (HCC) 11/24/2015  . Seasonal allergies     Past Surgical History:  Procedure Laterality Date  . ABDOMINAL HYSTERECTOMY  1995  . BACK SURGERY    . BREAST SURGERY     REDUCTION  . CATARACT EXTRACTION W/PHACO  08/01/2012   Procedure: CATARACT EXTRACTION PHACO AND INTRAOCULAR LENS PLACEMENT (IOC);  Surgeon: Chalmers Guest, MD;  Location: [facility] OR;  Service: Ophthalmology;  Laterality: Right;  . HERNIA REPAIR     RIGHT ING.  . JOINT REPLACEMENT  2014   rt total knee  . TONSILLECTOMY    . TOTAL KNEE ARTHROPLASTY Left 12/26/2015   Procedure: TOTAL KNEE ARTHROPLASTY;  Surgeon: Ollen Gross, MD;  Location: [facility] ORS;  Service: Orthopedics;  Laterality: Left;     reports that she quit smoking about 23 years ago. She has a 35.00 pack-year smoking history. She has never used smokeless tobacco. She reports that she drinks alcohol. She reports that she does not use drugs. Social History   Socioeconomic History  . Marital status: Divorced    Spouse name: Not on file  . Number of children: Not on file  . Years of education: Not on file  . Highest education level: Not on file  Occupational History  . Not on file  Social Needs  . Financial resource strain: Not on file  .  Food insecurity:    Worry: Not on file    Inability: Not on file  . Transportation needs:    Medical: Not on file    Non-medical: Not on file  Tobacco Use  . Smoking status: Former Smoker    Packs/day: 1.00    Years: 35.00    Pack years: 35.00    Last attempt to quit: 05/29/1995    Years since quitting: 23.1  . Smokeless tobacco: Never Used  Substance and Sexual Activity  . Alcohol use: Yes    Comment: rare  . Drug use: No  . Sexual activity: Not on file  Lifestyle  . Physical activity:    Days per week: Not on file    Minutes per session: Not on file  . Stress: Not on file  Relationships  . Social  connections:    Talks on phone: Not on file    Gets together: Not on file    Attends religious service: Not on file    Active member of club or organization: Not on file    Attends meetings of clubs or organizations: Not on file    Relationship status: Not on file  . Intimate partner violence:    Fear of current or ex partner: Not on file    Emotionally abused: Not on file    Physically abused: Not on file    Forced sexual activity: Not on file  Other Topics Concern  . Not on file  Social History Narrative  . Not on file    Pertinent  Health Maintenance Due  Topic Date Due  . DEXA SCAN  09/12/2003  . PNA vac Low Risk Adult (1 of 2 - PCV13) 09/12/2003  . INFLUENZA VACCINE  Completed    Medications: Allergies as of 07/07/2018      Reactions   Codeine Other (See Comments)   Lump in throat   Tape Hives, Rash   Paper tape only   Latex Rash   Nickel Rash   Bumps Also other metals      Medication List        Accurate as of 07/07/18 11:59 PM. Always use your most recent med list.          albuterol 108 (90 Base) MCG/ACT inhaler Commonly known as:  PROVENTIL HFA;VENTOLIN HFA Inhale 2 puffs into the lungs every 6 (six) hours as needed for wheezing or shortness of breath.   aspirin 81 MG chewable tablet Chew 1 tablet (81 mg total) by mouth daily for 21 days.   atorvastatin 20 MG tablet Commonly known as:  LIPITOR Take 1 tablet (20 mg total) by mouth daily.   benzonatate 100 MG capsule Commonly known as:  TESSALON Take 1 capsule (100 mg total) by mouth 3 (three) times daily as needed for cough.   Biotin 1000 MCG tablet Take 1 tablet (1 mg total) by mouth daily.   ENSURE Take 1 Can by mouth daily. QD D/T DECREASED APPETITE   folic acid 1 MG tablet Commonly known as:  FOLVITE Take 1 tablet (1 mg total) by mouth daily.   gabapentin 300 MG capsule Commonly known as:  NEURONTIN Take 300 mg by mouth at bedtime.   HYDROmorphone 2 MG tablet Commonly known as:   DILAUDID Take 1 tablet (2 mg total) by mouth at bedtime.   irbesartan 300 MG tablet Commonly known as:  AVAPRO Take 1 tablet (300 mg total) by mouth daily.   Magnesium 250 MG Tabs Take 1 tablet (250  mg total) by mouth daily.   methocarbamol 500 MG tablet Commonly known as:  ROBAXIN Take 1 tablet (500 mg total) by mouth every 6 (six) hours as needed for muscle spasms.   OxyCODONE HCl (Abuse Deter) 5 MG Taba Commonly known as:  OXAYDO Take 5 mg by mouth 2 (two) times daily for 7 days. take 1 tab po bid for pain   pantoprazole 40 MG tablet Commonly known as:  PROTONIX Take 1 tablet (40 mg total) by mouth daily.   Polyethyl Glycol-Propyl Glycol 0.4-0.3 % Soln Apply 1 drop to eye 3 (three) times daily.   predniSONE 10 MG tablet Commonly known as:  DELTASONE Take 1 tablet (10 mg total) by mouth daily with breakfast. Taper - 3 tabs po daily  for 7 days then 2 tabs po daily for 7 days then 10 tabs po daily. Further taper per PCP   Tiotropium Bromide Monohydrate 2.5 MCG/ACT Aers Inhale 2 puffs into the lungs daily for 1 day.   vitamin B-12 1000 MCG tablet Commonly known as:  CYANOCOBALAMIN Take 1 tablet (1,000 mcg total) by mouth daily.   Vitamin D3 25 MCG (1000 UT) Caps Take 1 capsule (1,000 Units total) by mouth daily.        Vitals:   07/07/18 0918  BP: (!) 150/79  Pulse: 60  Resp: 17  Temp: (!) 97.2 F (36.2 C)  Weight: 198 lb (89.8 kg)  Height: 5\' 1"  (1.549 m)   Body mass index is 37.41 kg/m.  Physical Exam  GENERAL APPEARANCE: Alert, conversant. No acute distress.  HEENT: Unremarkable. RESPIRATORY: Breathing is even, unlabored. Lung sounds are clear   CARDIOVASCULAR: Heart RRR no murmurs, rubs or gallops. No peripheral edema.  GASTROINTESTINAL: Abdomen is soft, non-tender, not distended w/ normal bowel sounds.  NEUROLOGIC: Cranial nerves 2-12 grossly intact. Moves all extremities   Labs reviewed: Basic Metabolic Panel: Recent Labs    06/04/18 1342  06/13/18 1453 06/25/18  NA 139 138 138  K 4.0 3.6 4.2  CL 108  --   --   CO2 27  --   --   GLUCOSE 98 186*  --   BUN 11  --  19  CREATININE 0.89  --  0.8  CALCIUM 9.8  --   --    No results found for: [facility] Liver Function Tests: No results for input(s): AST, ALT, ALKPHOS, BILITOT, PROT, ALBUMIN in the last 8760 hours. No results for input(s): LIPASE, AMYLASE in the last 8760 hours. No results for input(s): AMMONIA in the last 8760 hours. CBC: Recent Labs    06/04/18 1342 06/13/18 1453 06/25/18  WBC 6.7  --  11.9  HGB 12.8 9.9* 11.7*  HCT 40.1 29.0* 35*  MCV 97.1  --   --   PLT 311  --  432*   Lipid No results for input(s): CHOL, HDL, LDLCALC, TRIG in the last 8760 hours. Cardiac Enzymes: No results for input(s): CKTOTAL, CKMB, CKMBINDEX, TROPONINI in the last 8760 hours. BNP: No results for input(s): BNP in the last 8760 hours. CBG: No results for input(s): GLUCAP in the last 8760 hours.  Procedures and Imaging Studies During Stay: Dg Lumbar Spine 2-3 Views  Result Date: 06/13/2018 CLINICAL DATA:  77 year old female with lumbar fusion EXAM: DG C-ARM 61-120 MIN; LUMBAR SPINE - 2-3 VIEW COMPARISON:  None. FINDINGS: Limited intraoperative fluoroscopic spot images of the lumbar spine demonstrate changes of posterior lumbar interbody fusion with bilateral pedicle screw and rod fixation, with exposure above the  prior fixation. IMPRESSION: Limited intraoperative fluoroscopic spot images of superior extension of prior PLIF. Please refer to the dictated operative report for full details of intraoperative findings and procedure. Electronically Signed   By: Gilmer Mor D.O.   On: 06/13/2018 15:48   Dg C-arm 1-60 Min  Result Date: 06/13/2018 CLINICAL DATA:  77 year old female with lumbar fusion EXAM: DG C-ARM 61-120 MIN; LUMBAR SPINE - 2-3 VIEW COMPARISON:  None. FINDINGS: Limited intraoperative fluoroscopic spot images of the lumbar spine demonstrate changes of posterior  lumbar interbody fusion with bilateral pedicle screw and rod fixation, with exposure above the prior fixation. IMPRESSION: Limited intraoperative fluoroscopic spot images of superior extension of prior PLIF. Please refer to the dictated operative report for full details of intraoperative findings and procedure. Electronically Signed   By: Gilmer Mor D.O.   On: 06/13/2018 15:48   Dg C-arm 1-60 Min  Result Date: 06/13/2018 CLINICAL DATA:  77 year old female with lumbar fusion EXAM: DG C-ARM 61-120 MIN; LUMBAR SPINE - 2-3 VIEW COMPARISON:  None. FINDINGS: Limited intraoperative fluoroscopic spot images of the lumbar spine demonstrate changes of posterior lumbar interbody fusion with bilateral pedicle screw and rod fixation, with exposure above the prior fixation. IMPRESSION: Limited intraoperative fluoroscopic spot images of superior extension of prior PLIF. Please refer to the dictated operative report for full details of intraoperative findings and procedure. Electronically Signed   By: Gilmer Mor D.O.   On: 06/13/2018 15:48    Assessment/Plan:   Malnutrition of mild degree (HCC) - Plan: DISCONTINUED: ENSURE (ENSURE)  Mucopurulent chronic bronchitis (HCC) - Plan: albuterol (PROVENTIL HFA;VENTOLIN HFA) 108 (90 Base) MCG/ACT inhaler, DISCONTINUED: predniSONE (DELTASONE) 10 MG tablet  Pain - Plan: DISCONTINUED: OxyCODONE HCl, Abuse Deter, (OXAYDO) 5 MG TABA  Deep vein thrombosis (DVT) prophylaxis prescribed at discharge - Plan: DISCONTINUED: aspirin 81 MG chewable tablet  Pulmonary emphysema, unspecified emphysema type (HCC)  Dry eyes - Plan: DISCONTINUED: Polyethyl Glycol-Propyl Glycol (SYSTANE) 0.4-0.3 % SOLN  Lumbar spondylosis  Spondylosis  Status post lumbar spinal fusion  Status post lumbar spine surgery for decompression of spinal cord  Spinal stenosis of lumbar region with neurogenic claudication  Polyneuropathy  Acute blood loss as cause of postoperative  anemia  Essential hypertension  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  Social services at facility was responsible for arranging this appointment.  Pt was provided with a 30 day supply of prescriptions for medications and refills must be obtained from their PCP.  For controlled substances, a more limited supply may be provided adequate until PCP appointment only.  Time spent greater than 30 minutes;> 50% of time with patient was spent reviewing records, labs, tests and studies, counseling and developing plan of care   Joy [name]. Joy Hollingshead, MD

## 2018-07-08 DIAGNOSIS — M48062 Spinal stenosis, lumbar region with neurogenic claudication: Secondary | ICD-10-CM | POA: Diagnosis not present

## 2018-07-08 DIAGNOSIS — M4316 Spondylolisthesis, lumbar region: Secondary | ICD-10-CM | POA: Diagnosis not present

## 2018-07-09 ENCOUNTER — Encounter: Payer: Self-pay | Admitting: Internal Medicine

## 2018-07-09 ENCOUNTER — Other Ambulatory Visit: Payer: Self-pay | Admitting: Internal Medicine

## 2018-07-09 MED ORDER — FOLIC ACID 1 MG PO TABS: 1.0000 mg | ORAL_TABLET | Freq: Every day | ORAL | 0 refills | Status: DC

## 2018-07-09 MED ORDER — IRBESARTAN 300 MG PO TABS: 300.0000 mg | ORAL_TABLET | Freq: Every day | ORAL | 0 refills | Status: AC

## 2018-07-09 MED ORDER — ENSURE PO LIQD: 1.0000 | Freq: Every day | ORAL | 0 refills | Status: DC

## 2018-07-09 MED ORDER — ASPIRIN 81 MG PO CHEW: 81.0000 mg | CHEWABLE_TABLET | Freq: Every day | ORAL | 0 refills | Status: AC

## 2018-07-09 MED ORDER — PANTOPRAZOLE SODIUM 40 MG PO TBEC: 40.0000 mg | DELAYED_RELEASE_TABLET | Freq: Every day | ORAL | 0 refills | Status: DC

## 2018-07-09 MED ORDER — ATORVASTATIN CALCIUM 20 MG PO TABS: 20.0000 mg | ORAL_TABLET | Freq: Every day | ORAL | 0 refills | Status: DC

## 2018-07-09 MED ORDER — OXYCODONE HCL 5 MG PO TABA: 5.0000 mg | ORAL_TABLET | Freq: Two times a day (BID) | ORAL | 0 refills | Status: AC

## 2018-07-09 MED ORDER — PREDNISONE 10 MG PO TABS: 10.0000 mg | ORAL_TABLET | Freq: Every day | ORAL | 0 refills | Status: DC

## 2018-07-09 MED ORDER — BENZONATATE 100 MG PO CAPS: 100.0000 mg | ORAL_CAPSULE | Freq: Three times a day (TID) | ORAL | 0 refills | Status: DC | PRN

## 2018-07-09 MED ORDER — TIOTROPIUM BROMIDE MONOHYDRATE 2.5 MCG/ACT IN AERS: 2.0000 | INHALATION_SPRAY | Freq: Every day | RESPIRATORY_TRACT | 0 refills | Status: DC

## 2018-07-09 MED ORDER — MAGNESIUM 250 MG PO TABS: 250.0000 mg | ORAL_TABLET | Freq: Every day | ORAL | 0 refills | Status: AC

## 2018-07-09 MED ORDER — HYDROMORPHONE HCL 2 MG PO TABS: 2.0000 mg | ORAL_TABLET | Freq: Every day | ORAL | 0 refills | Status: DC

## 2018-07-09 MED ORDER — METHOCARBAMOL 500 MG PO TABS: 500.0000 mg | ORAL_TABLET | Freq: Four times a day (QID) | ORAL | 0 refills | Status: DC | PRN

## 2018-07-09 MED ORDER — ALBUTEROL SULFATE HFA 108 (90 BASE) MCG/ACT IN AERS: 2.0000 | INHALATION_SPRAY | Freq: Four times a day (QID) | RESPIRATORY_TRACT | 2 refills | Status: DC | PRN

## 2018-07-09 MED ORDER — BIOTIN 1000 MCG PO TABS: 1000.0000 ug | ORAL_TABLET | Freq: Every day | ORAL | 0 refills | Status: DC

## 2018-07-09 MED ORDER — VITAMIN B-12 1000 MCG PO TABS: 1000.0000 ug | ORAL_TABLET | Freq: Every day | ORAL | 0 refills | Status: DC

## 2018-07-09 MED ORDER — VITAMIN D3 25 MCG (1000 UT) PO CAPS: 1000.0000 [IU] | ORAL_CAPSULE | Freq: Every day | ORAL | 0 refills | Status: DC

## 2018-07-09 MED ORDER — POLYETHYL GLYCOL-PROPYL GLYCOL 0.4-0.3 % OP SOLN: 1.0000 [drp] | Freq: Three times a day (TID) | OPHTHALMIC | 0 refills | Status: DC

## 2018-07-10 DIAGNOSIS — Z4789 Encounter for other orthopedic aftercare: Secondary | ICD-10-CM | POA: Diagnosis not present

## 2018-07-10 DIAGNOSIS — J449 Chronic obstructive pulmonary disease, unspecified: Secondary | ICD-10-CM | POA: Diagnosis not present

## 2018-07-10 DIAGNOSIS — M069 Rheumatoid arthritis, unspecified: Secondary | ICD-10-CM | POA: Diagnosis not present

## 2018-07-10 DIAGNOSIS — G629 Polyneuropathy, unspecified: Secondary | ICD-10-CM | POA: Diagnosis not present

## 2018-07-10 DIAGNOSIS — I10 Essential (primary) hypertension: Secondary | ICD-10-CM | POA: Diagnosis not present

## 2018-07-10 DIAGNOSIS — K219 Gastro-esophageal reflux disease without esophagitis: Secondary | ICD-10-CM | POA: Diagnosis not present

## 2018-07-11 DIAGNOSIS — K219 Gastro-esophageal reflux disease without esophagitis: Secondary | ICD-10-CM | POA: Diagnosis not present

## 2018-07-11 DIAGNOSIS — G629 Polyneuropathy, unspecified: Secondary | ICD-10-CM | POA: Diagnosis not present

## 2018-07-11 DIAGNOSIS — J449 Chronic obstructive pulmonary disease, unspecified: Secondary | ICD-10-CM | POA: Diagnosis not present

## 2018-07-11 DIAGNOSIS — M069 Rheumatoid arthritis, unspecified: Secondary | ICD-10-CM | POA: Diagnosis not present

## 2018-07-11 DIAGNOSIS — I10 Essential (primary) hypertension: Secondary | ICD-10-CM | POA: Diagnosis not present

## 2018-07-11 DIAGNOSIS — Z4789 Encounter for other orthopedic aftercare: Secondary | ICD-10-CM | POA: Diagnosis not present

## 2018-07-12 ENCOUNTER — Encounter: Payer: Self-pay | Admitting: Internal Medicine

## 2018-07-14 DIAGNOSIS — M069 Rheumatoid arthritis, unspecified: Secondary | ICD-10-CM | POA: Diagnosis not present

## 2018-07-14 DIAGNOSIS — J449 Chronic obstructive pulmonary disease, unspecified: Secondary | ICD-10-CM | POA: Diagnosis not present

## 2018-07-14 DIAGNOSIS — G629 Polyneuropathy, unspecified: Secondary | ICD-10-CM | POA: Diagnosis not present

## 2018-07-14 DIAGNOSIS — Z4789 Encounter for other orthopedic aftercare: Secondary | ICD-10-CM | POA: Diagnosis not present

## 2018-07-14 DIAGNOSIS — J209 Acute bronchitis, unspecified: Secondary | ICD-10-CM | POA: Diagnosis not present

## 2018-07-14 DIAGNOSIS — I10 Essential (primary) hypertension: Secondary | ICD-10-CM | POA: Diagnosis not present

## 2018-07-14 DIAGNOSIS — M48061 Spinal stenosis, lumbar region without neurogenic claudication: Secondary | ICD-10-CM | POA: Diagnosis not present

## 2018-07-14 DIAGNOSIS — K219 Gastro-esophageal reflux disease without esophagitis: Secondary | ICD-10-CM | POA: Diagnosis not present

## 2018-07-15 DIAGNOSIS — K219 Gastro-esophageal reflux disease without esophagitis: Secondary | ICD-10-CM | POA: Diagnosis not present

## 2018-07-15 DIAGNOSIS — Z4789 Encounter for other orthopedic aftercare: Secondary | ICD-10-CM | POA: Diagnosis not present

## 2018-07-15 DIAGNOSIS — M069 Rheumatoid arthritis, unspecified: Secondary | ICD-10-CM | POA: Diagnosis not present

## 2018-07-15 DIAGNOSIS — J449 Chronic obstructive pulmonary disease, unspecified: Secondary | ICD-10-CM | POA: Diagnosis not present

## 2018-07-15 DIAGNOSIS — G629 Polyneuropathy, unspecified: Secondary | ICD-10-CM | POA: Diagnosis not present

## 2018-07-15 DIAGNOSIS — I10 Essential (primary) hypertension: Secondary | ICD-10-CM | POA: Diagnosis not present

## 2018-07-18 DIAGNOSIS — G629 Polyneuropathy, unspecified: Secondary | ICD-10-CM | POA: Diagnosis not present

## 2018-07-18 DIAGNOSIS — M069 Rheumatoid arthritis, unspecified: Secondary | ICD-10-CM | POA: Diagnosis not present

## 2018-07-18 DIAGNOSIS — K219 Gastro-esophageal reflux disease without esophagitis: Secondary | ICD-10-CM | POA: Diagnosis not present

## 2018-07-18 DIAGNOSIS — I10 Essential (primary) hypertension: Secondary | ICD-10-CM | POA: Diagnosis not present

## 2018-07-18 DIAGNOSIS — Z4789 Encounter for other orthopedic aftercare: Secondary | ICD-10-CM | POA: Diagnosis not present

## 2018-07-18 DIAGNOSIS — J449 Chronic obstructive pulmonary disease, unspecified: Secondary | ICD-10-CM | POA: Diagnosis not present

## 2018-07-21 DIAGNOSIS — J449 Chronic obstructive pulmonary disease, unspecified: Secondary | ICD-10-CM | POA: Diagnosis not present

## 2018-07-21 DIAGNOSIS — K219 Gastro-esophageal reflux disease without esophagitis: Secondary | ICD-10-CM | POA: Diagnosis not present

## 2018-07-21 DIAGNOSIS — M069 Rheumatoid arthritis, unspecified: Secondary | ICD-10-CM | POA: Diagnosis not present

## 2018-07-21 DIAGNOSIS — Z4789 Encounter for other orthopedic aftercare: Secondary | ICD-10-CM | POA: Diagnosis not present

## 2018-07-21 DIAGNOSIS — G629 Polyneuropathy, unspecified: Secondary | ICD-10-CM | POA: Diagnosis not present

## 2018-07-21 DIAGNOSIS — I10 Essential (primary) hypertension: Secondary | ICD-10-CM | POA: Diagnosis not present

## 2018-07-29 DIAGNOSIS — M0589 Other rheumatoid arthritis with rheumatoid factor of multiple sites: Secondary | ICD-10-CM | POA: Diagnosis not present

## 2018-08-20 NOTE — Progress Notes (Signed)
 @Patient  ID: Joy [name], female    DOB: April 22, 1941, 78 y.o.   MRN: [id number]  Chief Complaint  Patient presents with  . Follow-up    Pulmonary function test    Referring provider: Juluis RainierBarnes, Elizabeth, MD  HPI:  78 year old female former smoker followed in our office for suspected COPD, mucopurulent chronic bronchitis  PMH:  Smoker/ Smoking History: Former smoker Maintenance:  Spiriva 2.5 Pt of: Dr. Everardo AllEllison  Recent [facility] Encounters:     08/21/2018  - Visit   [id number] female former smoker presenting to our office today for follow-up visit with pulmonary function test.  Patient reports that her symptoms remain unchanged from last office visit.  Patient still with productive baseline cough of yellow-green mucus.  Patient reports that she has been dealing with this cough since June or July 2019.  Patient was prescribed Spiriva 2.5 as well as prednisone at last office visit and patient reports that this did help her cough.  Unfortunately patient did not request a refill of the Spiriva as as patient just completed sample and did not continue on the inhaler.  08/21/2018-pulmonary function test-FVC 2.92 (158% predicted), postbronchodilator ratio 78, FEV1 156, no significant bronchodilator response, slight mid flow reversibility's, DLCO 74, slight concavity and flow volume loops  Previous chest x-ray in May/2019 shows hyperexpansion of lungs and likely COPD and chronic bronchitis symptoms.  Patient reports that she does believe the cough as a seasonal aspect as she reports that she always has worsened bronchitis episodes in April/spring as well as in the fall.  See cough ROS below.    Tests:  08/21/2018-pulmonary function test-FVC 2.92 (158% predicted), postbronchodilator ratio 78, FEV1 156, no significant bronchodilator response, slight mid flow reversibility's, DLCO 74, slight concavity and flow volume loops   FENO:  No results found for: NITRICOXIDE  PFT: PFT Results Latest  Ref Rng & Units 08/21/2018  FVC-Pre L 2.92  FVC-Predicted Pre % 158  FVC-Post L 2.84  FVC-Predicted Post % 154  Pre FEV1/FVC % % 74  Post FEV1/FCV % % 78  FEV1-Pre L 2.17  FEV1-Predicted Pre % 153  FEV1-Post L 2.21  DLCO UNC% % 74  DLCO COR %Predicted % 84  TLC L 4.97  TLC % Predicted % 104  RV % Predicted % 90    Imaging: No results found.    Specialty Problems      Pulmonary Problems   Cough   Mucopurulent chronic bronchitis (HCC)      Allergies  Allergen Reactions  . Codeine Other (See Comments)    Lump in throat  . Tape Hives and Rash    Paper tape only  . Latex Rash  . Nickel Rash    Bumps Also other metals    Immunization History  Administered Date(s) Administered  . Influenza, High Dose Seasonal PF 05/20/2018    Past Medical History:  Diagnosis Date  . Ankylosing spondylitis (HCC)   . Arthritis    RHEUMATOID  . Bronchitis   . COPD (chronic obstructive pulmonary disease) (HCC)    CXR 01/11/18 showed mild COPD and chronic bronchitis  . CTS (carpal tunnel syndrome)   . Dysrhythmia    "irregularity" unknown at this time - being evaluated by cardiology  . Essential hypertension 11/24/2015  . GERD (gastroesophageal reflux disease)   . Hypercholesteremia   . Hyperlipidemia 11/24/2015  . Hypertension   . IBS (irritable bowel syndrome)   . Neuropathy   . OAB (overactive bladder)   . Osteoarthritis   .  Osteoporosis   . RA (rheumatoid arthritis) (HCC)   . Rheumatoid arthritis (HCC) 11/24/2015  . Seasonal allergies     Tobacco History: Social History   Tobacco Use  Smoking Status Former Smoker  . Packs/day: 1.00  . Years: 35.00  . Pack years: 35.00  . Last attempt to quit: 05/29/1995  . Years since quitting: 23.2  Smokeless Tobacco Never Used   Counseling given: Not Answered   Outpatient Encounter Medications as of 08/21/2018  Medication Sig  . albuterol (PROVENTIL HFA;VENTOLIN HFA) 108 (90 Base) MCG/ACT inhaler Inhale 2 puffs into the lungs  every 6 (six) hours as needed for wheezing or shortness of breath.  Marland Kitchen atorvastatin (LIPITOR) 20 MG tablet Take 1 tablet (20 mg total) by mouth daily.  . benzonatate (TESSALON) 100 MG capsule Take 1 capsule (100 mg total) by mouth 3 (three) times daily as needed for cough.  . Biotin 1000 MCG tablet Take 1 tablet (1 mg total) by mouth daily.  . Cholecalciferol (VITAMIN D3) 25 MCG (1000 UT) CAPS Take 1 capsule (1,000 Units total) by mouth daily.  . folic acid (FOLVITE) 1 MG tablet Take 1 tablet (1 mg total) by mouth daily.  Marland Kitchen gabapentin (NEURONTIN) 300 MG capsule Take 300 mg by mouth at bedtime.   Marland Kitchen HYDROmorphone (DILAUDID) 2 MG tablet Take 1 tablet (2 mg total) by mouth at bedtime.  . irbesartan (AVAPRO) 300 MG tablet Take 1 tablet (300 mg total) by mouth daily.  . Magnesium 250 MG TABS Take 1 tablet (250 mg total) by mouth daily.  . methocarbamol (ROBAXIN) 500 MG tablet Take 1 tablet (500 mg total) by mouth every 6 (six) hours as needed for muscle spasms.  . pantoprazole (PROTONIX) 40 MG tablet Take 1 tablet (40 mg total) by mouth daily.  Bertram Gala Glycol-Propyl Glycol (SYSTANE) 0.4-0.3 % SOLN Apply 1 drop to eye 3 (three) times daily.  . vitamin B-12 (CYANOCOBALAMIN) 1000 MCG tablet Take 1 tablet (1,000 mcg total) by mouth daily.  . [DISCONTINUED] ENSURE (ENSURE) Take 1 Can by mouth daily. QD D/T DECREASED APPETITE  . [DISCONTINUED] predniSONE (DELTASONE) 10 MG tablet Take 1 tablet (10 mg total) by mouth daily with breakfast. Taper - 3 tabs po daily  for 7 days then 2 tabs po daily for 7 days then 10 tabs po daily. Further taper per PCP  . amoxicillin-clavulanate (AUGMENTIN) 875-125 MG tablet Take 1 tablet by mouth 2 (two) times daily.  . Tiotropium Bromide Monohydrate (SPIRIVA RESPIMAT) 2.5 MCG/ACT AERS Inhale 2 puffs into the lungs daily for 1 day.  . Tiotropium Bromide Monohydrate (SPIRIVA RESPIMAT) 2.5 MCG/ACT AERS Inhale 2 puffs into the lungs daily.  . [DISCONTINUED] Tiotropium Bromide  Monohydrate (SPIRIVA RESPIMAT) 2.5 MCG/ACT AERS Inhale 2 puffs into the lungs daily for 1 day.   No facility-administered encounter medications on file as of 08/21/2018.      Review of Systems  Review of Systems  Constitutional: Negative for chills, fatigue, fever and unexpected weight change.  HENT: Positive for congestion and postnasal drip. Negative for ear pain, sinus pressure and sinus pain.   Respiratory: Positive for cough (productive brown green mucous ) and shortness of breath. Negative for chest tightness and wheezing.   Cardiovascular: Negative for chest pain and palpitations.  Gastrointestinal: Negative for diarrhea, nausea and vomiting.       Denies acid reflux or GERD  Musculoskeletal: Negative for arthralgias.  Skin: Negative for color change.  Allergic/Immunologic: Positive for environmental allergies (?  Seasonal aspect). Negative  for food allergies.  Neurological: Negative for light-headedness and headaches.  Psychiatric/Behavioral: Negative for dysphoric mood. The patient is not nervous/anxious.   All other systems reviewed and are negative.    Cough ROS:   When to the symptoms start: June 2019 How are you today: Worse today   Have you had fever/sore throat (first 5 to 7 days of URI) or Have you had cough/nasal congestion (10 to 14 days of URI) : +cough and nasal congestion  Have you used anything to treat the cough, as anything improved : tessalon, otc cough, pred, spiriva  Is it a dry or wet cough: productive cough - yellow / brown / green  Does the cough happen when your breathing or when you breathe out: unsure  Other any triggers to your cough, or any aggravating factors: going bed, lying down   Daily antihistamine: none  GERD treatment: Protonix  Singulair: none   Cough checklist (bolded indicates presence):  Adherence, acid reflux, ACE inhibitor, active sinus disease, active smoking, adverse effects of medications (amiodarone/Macrodantin/bb), alpha 1,  allergies, aspiration, anxiety, bronchiectasis, congestive heart failure (diastolic)   Physical Exam  BP 580/99 (BP Location: Left Arm, Cuff Size: Normal)   Pulse 73   Temp 98.8 F (37.1 C) (Oral)   Ht 5\' 2"  (1.575 m)   Wt 187 lb (84.8 kg)   SpO2 100%   BMI 34.20 kg/m   Wt Readings from Last 5 Encounters:  08/21/18 187 lb (84.8 kg)  07/07/18 198 lb (89.8 kg)  06/20/18 198 lb (89.8 kg)  06/09/18 198 lb 6.4 oz (90 kg)  06/04/18 200 lb 6.4 oz (90.9 kg)    Physical Exam  Constitutional: She is oriented to person, place, and time and well-developed, well-nourished, and in no distress. No distress.  HENT:  Head: Normocephalic and atraumatic.  Right Ear: Hearing, external ear and ear canal normal.  Left Ear: Hearing, external ear and ear canal normal.  Nose: Mucosal edema and rhinorrhea present. Right sinus exhibits frontal sinus tenderness. Right sinus exhibits no maxillary sinus tenderness. Left sinus exhibits no maxillary sinus tenderness and no frontal sinus tenderness.  Mouth/Throat: Uvula is midline and oropharynx is clear and moist. No oropharyngeal exudate.  + TMs with effusion without infection, left greater than right + Postnasal drip  Eyes: Pupils are equal, round, and reactive to light.  Neck: Normal range of motion. Neck supple. No JVD present.  Cardiovascular: Normal rate, regular rhythm and normal heart sounds.  Pulmonary/Chest: Effort normal and breath sounds normal. No accessory muscle usage. No respiratory distress. She has no decreased breath sounds. She has no wheezes. She has no rhonchi. She has no rales.  Abdominal: Soft. Bowel sounds are normal. There is no abdominal tenderness.  Musculoskeletal: Normal range of motion.        General: No edema.  Lymphadenopathy:    She has no cervical adenopathy.  Neurological: She is alert and oriented to person, place, and time. Gait normal.  Skin: Skin is warm and dry. She is not diaphoretic. No erythema.  Psychiatric:  Mood, memory, affect and judgment normal.  Nursing note and vitals reviewed.     Lab Results:  CBC    Component Value Date/Time   WBC 11.9 06/25/2018   WBC 6.7 06/04/2018 1342   RBC 4.13 06/04/2018 1342   HGB 11.7 (A) 06/25/2018   HCT 35 (A) 06/25/2018   PLT 432 (A) 06/25/2018   MCV 97.1 06/04/2018 1342   MCH 31.0 06/04/2018 1342   MCHC  31.9 06/04/2018 1342   RDW 14.5 06/04/2018 1342    BMET    Component Value Date/Time   NA 138 06/25/2018   K 4.2 06/25/2018   CL 108 06/04/2018 1342   CO2 27 06/04/2018 1342   GLUCOSE 186 (H) 06/13/2018 1453   BUN 19 06/25/2018   CREATININE 0.8 06/25/2018   CREATININE 0.89 06/04/2018 1342   CALCIUM 9.8 06/04/2018 1342   GFRNONAA >60 06/04/2018 1342   GFRAA >60 06/04/2018 1342    BNP No results found for: BNP  ProBNP No results found for: PROBNP    Assessment & Plan:   78 year old female patient completing follow-up with our office today.  Pulmonary function test not indicative of typical COPD.  Does show likely mild obstruction.  I do believe patient symptoms are directly related to chronic bronchitis.  Will restart patient on Spiriva 2.5.  We will do course of Augmentin, chest x-ray, baseline lab work.  We will have patient follow-up with Dr. Everardo AllEllison in 4 to 6 weeks.  Rheumatoid arthritis (HCC) Continue follow-up with rheumatologist  Mucopurulent chronic bronchitis (HCC) Assessment: Lungs clear to auscultation Patient reports improvement when on Spiriva Body habitus reflective of chronic bronchitis patient   Plan: Reviewed pulmonary function testing that does not show direct obstruction.  Postbronchodilator ratio 78 DLCO is stable at 74, unlikely need for ILD evaluation, with patient's rheumatoid arthritis could consider high-res CT if no improvements with antibiotic therapy and if chest x-ray looks worsened High-res CT could help support emphysema diagnosis which could allow patient to participate in pulmonary  rehab  We will provide a course of antibiotic coverage for chronic bronchitis flare Augmentin >>> Take 1 875-125 mg tablet every 12 hours for the next 7 days >>> Take with food  Spiriva Respimat 2.5 >>> 2 puffs daily >>> Do this every day >>>This is not a rescue inhaler  Labwork today   Chest x-ray today  Note your daily symptoms > remember "red flags" for COPD:   >>>Increase in cough >>>increase in sputum production >>>increase in shortness of breath or activity  intolerance.   If you notice these symptoms, please call the office to be seen.   Follow-up in 4 to 6 weeks with Dr. Everardo AllEllison.  Cough Assessment: Based off patient interview it seems to have a seasonal aspect to her cough Chronic cough of about 6 to 9 months with no real improvement  Plan: Patient reports improvement with cough when on Spiriva will refill and provide sample today and coached on inhaler technique We will get baseline blood work of CBC with differential and IgE to rule out allergic aspect Reviewed pulmonary function testing with patient We will get chest x-ray, previous chest x-ray May/2019 showed chronic bronchitis and hyperexpansion We will do course of Augmentin to cover for chronic bronchitis to see if this can improve patient's symptoms as well as sputum color and amount     Coral CeoBrian P Zyion Leidner, NP 08/21/2018   This appointment was 36 minutes along with over 50% of the time in direct face-to-face patient care, assessment, plan of care, and follow-up.

## 2018-08-21 ENCOUNTER — Ambulatory Visit (INDEPENDENT_AMBULATORY_CARE_PROVIDER_SITE_OTHER): Payer: Medicare Other | Admitting: Pulmonary Disease

## 2018-08-21 ENCOUNTER — Ambulatory Visit (INDEPENDENT_AMBULATORY_CARE_PROVIDER_SITE_OTHER)
Admission: RE | Admit: 2018-08-21 | Discharge: 2018-08-21 | Disposition: A | Discharge status: Home / Self Care | Payer: Medicare Other | Source: Ambulatory Visit | Attending: Pulmonary Disease | Admitting: Pulmonary Disease

## 2018-08-21 ENCOUNTER — Encounter: Payer: Self-pay | Admitting: Pulmonary Disease

## 2018-08-21 VITALS — BP 140/76 | HR 73 | Temp 98.8°F | Ht 62.0 in | Wt 187.0 lb

## 2018-08-21 DIAGNOSIS — J411 Mucopurulent chronic bronchitis: Secondary | ICD-10-CM

## 2018-08-21 DIAGNOSIS — M05762 Rheumatoid arthritis with rheumatoid factor of left knee without organ or systems involvement: Secondary | ICD-10-CM | POA: Diagnosis not present

## 2018-08-21 DIAGNOSIS — R05 Cough: Secondary | ICD-10-CM | POA: Diagnosis not present

## 2018-08-21 DIAGNOSIS — R059 Cough, unspecified: Secondary | ICD-10-CM | POA: Insufficient documentation

## 2018-08-21 LAB — PULMONARY FUNCTION TEST
DL/VA % pred: 84 %
DL/VA: 3.82 ml/min/mmHg/L
DLCO COR % PRED: 78 %
DLCO COR: 16.97 ml/min/mmHg
DLCO UNC: 16.01 ml/min/mmHg
DLCO unc % pred: 74 %
FEF 25-75 POST: 1.82 L/s
FEF 25-75 Pre: 1.59 L/sec
FEF2575-%Change-Post: 14 %
FEF2575-%PRED-PRE: 131 %
FEF2575-%Pred-Post: 150 %
FEV1-%CHANGE-POST: 1 %
FEV1-%PRED-POST: 156 %
FEV1-%Pred-Pre: 153 %
FEV1-POST: 2.21 L
FEV1-Pre: 2.17 L
FEV1FVC-%Change-Post: 4 %
FEV1FVC-%PRED-PRE: 98 %
FEV6-%Change-Post: -1 %
FEV6-%PRED-POST: 162 %
FEV6-%Pred-Pre: 164 %
FEV6-POST: 2.84 L
FEV6-Pre: 2.87 L
FEV6FVC-%CHANGE-POST: 1 %
FEV6FVC-%PRED-POST: 104 %
FEV6FVC-%Pred-Pre: 103 %
FVC-%CHANGE-POST: -2 %
FVC-%PRED-PRE: 158 %
FVC-%Pred-Post: 154 %
FVC-PRE: 2.92 L
FVC-Post: 2.84 L
PRE FEV1/FVC RATIO: 74 %
PRE FEV6/FVC RATIO: 98 %
Post FEV1/FVC ratio: 78 %
Post FEV6/FVC ratio: 100 %
RV % pred: 90 %
RV: 2.06 L
TLC % PRED: 104 %
TLC: 4.97 L

## 2018-08-21 LAB — CBC WITH DIFFERENTIAL/PLATELET
BASOS ABS: 0 10*3/uL (ref 0.0–0.1)
Basophils Relative: 0.6 % (ref 0.0–3.0)
EOS PCT: 3.9 % (ref 0.0–5.0)
Eosinophils Absolute: 0.2 10*3/uL (ref 0.0–0.7)
HEMATOCRIT: 38.1 % (ref 36.0–46.0)
HEMOGLOBIN: 12.7 g/dL (ref 12.0–15.0)
LYMPHS ABS: 2.1 10*3/uL (ref 0.7–4.0)
LYMPHS PCT: 35.3 % (ref 12.0–46.0)
MCHC: 33.4 g/dL (ref 30.0–36.0)
MCV: 92.4 fl (ref 78.0–100.0)
MONOS PCT: 7.4 % (ref 3.0–12.0)
Monocytes Absolute: 0.4 10*3/uL (ref 0.1–1.0)
NEUTROS PCT: 52.8 % (ref 43.0–77.0)
Neutro Abs: 3.1 10*3/uL (ref 1.4–7.7)
Platelets: 291 10*3/uL (ref 150.0–400.0)
RBC: 4.13 Mil/uL (ref 3.87–5.11)
RDW: 14.4 % (ref 11.5–15.5)
WBC: 5.8 10*3/uL (ref 4.0–10.5)

## 2018-08-21 MED ORDER — TIOTROPIUM BROMIDE MONOHYDRATE 2.5 MCG/ACT IN AERS: 2.0000 | INHALATION_SPRAY | Freq: Every day | RESPIRATORY_TRACT | 6 refills | Status: DC

## 2018-08-21 MED ORDER — AMOXICILLIN-POT CLAVULANATE 875-125 MG PO TABS: 1.0000 | ORAL_TABLET | Freq: Two times a day (BID) | ORAL | 0 refills | Status: DC

## 2018-08-21 MED ORDER — TIOTROPIUM BROMIDE MONOHYDRATE 2.5 MCG/ACT IN AERS: 2.0000 | INHALATION_SPRAY | Freq: Every day | RESPIRATORY_TRACT | 0 refills | Status: DC

## 2018-08-21 NOTE — Progress Notes (Signed)
PFT done today. 

## 2018-08-21 NOTE — Patient Instructions (Addendum)
Augmentin >>> Take 1 875-125 mg tablet every 12 hours for the next 7 days >>> Take with food  Spiriva Respimat 2.5 >>> 2 puffs daily >>> Do this every day >>>This is not a rescue inhaler  Labwork today   Chest x-ray today  Note your daily symptoms > remember "red flags" for COPD:   >>>Increase in cough >>>increase in sputum production >>>increase in shortness of breath or activity  intolerance.   If you notice these symptoms, please call the office to be seen.   Follow-up in 4 to 6 weeks with Dr. Everardo All.  It is flu season:   >>>Remember to be washing your hands regularly, using hand sanitizer, be careful to use around herself with has contact with people who are sick will increase her chances of getting sick yourself. >>> Best ways to protect herself from the flu: Receive the yearly flu vaccine, practice good hand hygiene washing with soap and also using hand sanitizer when available, eat a nutritious meals, get adequate rest, hydrate appropriately   Please contact the office if your symptoms worsen or you have concerns that you are not improving.   Thank you for choosing [facility] for your healthcare, and for allowing [place] to partner with you on your healthcare journey. I am thankful to be able to provide care to you today.   Elisha Headland FNP-C

## 2018-08-21 NOTE — Assessment & Plan Note (Signed)
Assessment: Based off patient interview it seems to have a seasonal aspect to her cough Chronic cough of about 6 to 9 months with no real improvement  Plan: Patient reports improvement with cough when on Spiriva will refill and provide sample today and coached on inhaler technique We will get baseline blood work of CBC with differential and IgE to rule out allergic aspect Reviewed pulmonary function testing with patient We will get chest x-ray, previous chest x-ray May/2019 showed chronic bronchitis and hyperexpansion We will do course of Augmentin to cover for chronic bronchitis to see if this can improve patient's symptoms as well as sputum color and amount

## 2018-08-21 NOTE — Assessment & Plan Note (Signed)
Continue follow up with rheumatologist 

## 2018-08-21 NOTE — Assessment & Plan Note (Addendum)
Assessment: Lungs clear to auscultation Patient reports improvement when on Spiriva Body habitus reflective of chronic bronchitis patient   Plan: Reviewed pulmonary function testing that does not show direct obstruction.  Postbronchodilator ratio 78 DLCO is stable at 74, unlikely need for ILD evaluation, with patient's rheumatoid arthritis could consider high-res CT if no improvements with antibiotic therapy and if chest x-ray looks worsened High-res CT could help support emphysema diagnosis which could allow patient to participate in pulmonary rehab  We will provide a course of antibiotic coverage for chronic bronchitis flare Augmentin >>> Take 1 875-125 mg tablet every 12 hours for the next 7 days >>> Take with food  Spiriva Respimat 2.5 >>> 2 puffs daily >>> Do this every day >>>This is not a rescue inhaler  Labwork today   Chest x-ray today  Note your daily symptoms > remember "red flags" for COPD:   >>>Increase in cough >>>increase in sputum production >>>increase in shortness of breath or activity  intolerance.   If you notice these symptoms, please call the office to be seen.   Follow-up in 4 to 6 weeks with Dr. Everardo All.

## 2018-08-21 NOTE — Progress Notes (Signed)
Patient seen in the office today and instructed on use of SPIRIVA 2.5.  Patient expressed understanding and demonstrated technique.

## 2018-08-21 NOTE — Progress Notes (Signed)
Your chest x-ray results of come back.  Showing no acute changes.  No plan of care changes at this time.  Keep follow-up appointment.    Follow-up with our office if symptoms worsen or you do not feel like you are improving under her current regimen.  It was a pleasure taking care of you,  Brian Mack, FNP 

## 2018-08-22 ENCOUNTER — Telehealth: Payer: Self-pay | Admitting: Pulmonary Disease

## 2018-08-22 LAB — IGE: IGE (IMMUNOGLOBULIN E), SERUM: 12 kU/L (ref <=114)

## 2018-08-22 NOTE — Progress Notes (Signed)
Lab work is come back stable.  Showing no allergic markers.  Continue with plan of care as prescribed.  Keep follow-up with Dr. Everardo All.

## 2018-08-22 NOTE — Telephone Encounter (Signed)
Notes recorded by Coral Ceo, NP on 08/21/2018 at 4:32 PM EST Your chest x-ray results of come back. Showing no acute changes. No plan of care changes at this time. Keep follow-up appointment.   Follow-up with our office if symptoms worsen or you do not feel like you are improving under her current regimen.  It was a pleasure taking care of you,  Joy Headland, FNP ---------------------------------------------------------------- Spoke with pt. She is aware of results. Nothing further was needed.

## 2018-08-26 ENCOUNTER — Telehealth: Payer: Self-pay | Admitting: Pulmonary Disease

## 2018-08-26 NOTE — Telephone Encounter (Signed)
Called and spoke with patient, she stated that the Spiriva was too expensive for her being 96$ I advised patient she can contact her insurance company and they can give her options as what may be cheaper for her. Patient will contact us with this list once she receives it.

## 2018-08-27 NOTE — Telephone Encounter (Signed)
Called and spoke with Patient. She is going to check with her pharmacy and her insurance company today and will call back once she knows preferred inhalers.  Will leave open until Patient calls back

## 2018-08-28 NOTE — Telephone Encounter (Signed)
Patient is still checking on preferred inhalers and is going to call us once she has.

## 2018-09-01 NOTE — Telephone Encounter (Signed)
Pt is returning call CB# [phone]/kob

## 2018-09-01 NOTE — Telephone Encounter (Signed)
Attempted to call Patient.  Left detailed message to call back, once she had inhaler list from insurance.

## 2018-09-01 NOTE — Telephone Encounter (Signed)
Attempted to call Patient.  Left message to call back. 

## 2018-09-02 NOTE — Telephone Encounter (Signed)
Spoke with pt, she states they do not have any generic alternatives. She wants to know how important using the inhaler is at this point is? She just cannot afford 400 dollars a month. Arlys John please advise.

## 2018-09-02 NOTE — Telephone Encounter (Signed)
None of the controller inhalers will be generic. Patient needs to contact insurance company to see what is on her formulary. Most of these options are tier 3, so usually around 45 dollars a month. Unless pt has high deductible which she may have to meet before her drug coverage starts.   Did the patient notice a difference with spiriva? Was her breathing improved?   Joy [name]

## 2018-09-03 MED ORDER — TIOTROPIUM BROMIDE MONOHYDRATE 2.5 MCG/ACT IN AERS: 2.0000 | INHALATION_SPRAY | Freq: Every day | RESPIRATORY_TRACT | 0 refills | Status: DC

## 2018-09-03 NOTE — Telephone Encounter (Signed)
I am inclined to believe that the Spiriva has helped her more than she thought.  Unfortunately with her high deductible plan any inhaler will cost a significant amount of money.  We can try to help supplement and offset the cost with samples if we have them.   If we have additional Spiriva samples available we could offer to have 1 or 2 of them placed upfront for the patient patient to use consistently till that office visit.  Patient to keep follow-up with Dr. Everardo All on 09/24/2018.  Elisha Headland, FNP

## 2018-09-03 NOTE — Telephone Encounter (Signed)
Called spoke with patient, discussed Arlys John NP's recommendations as stated below Patient is happy to continue the Spiriva and will be grateful for any samples we can provide 2 samples obtained for patient and placed up front for her to pick up at her convenience Samples documented per protocol Patient aware to contact the office if anything is needed prior to her appt  Nothing further needed; will sign off

## 2018-09-03 NOTE — Telephone Encounter (Signed)
Called [facility] and spoke with Delice Bison to see if they received any covered alternatives when the Rx was sent to insurance >> per Delice Bison, patient has a high deductible plan...inhalers will be expensive no matter what is chosen.  Called spoke with patient to discuss the above.  Per patient, she is aware that she has a high deductible plan and did not "want to pay off her entire deductible on one medication."  When asked patient about improvement while on the Spiriva, she stated that "she didn't think anything was wrong with her anyway" so does not completely attribute her recovery thus far to the Spiriva.  Patient did report that her cough has improved 'tremendously' and the mucus color is now clear.  She denies any difficulty breathing, wheezing, chest tightness.  Patient does not take her Spiriva regularly and still has approximately 2 weeks left on her sample.    Patient will complete her Spiriva sample as instructed Appt scheduled with Dr Everardo All for the 4-6 week follow recommended by Arlys John NP for 2.5.2020 @ 1545  Arlys John, do you have any additional recommendations for patient?  Thank you!

## 2018-09-03 NOTE — Progress Notes (Signed)
Triage was contacted the patient.  The patient has a high deductible plan so any inhaler that we place her on will have a significant expense.  Patient denies that the Spiriva is helped her but reports that her cough as well as her mucus production has decreased and her mucus color has changed to clear.  Patient has not been consistent in using her Spiriva daily.  She reports she is noticed no change (except for the cough improvement which she does not believe is from Spiriva).Patient was scheduled for follow-up with you on 09/24/2018  Arlys JohnBrian

## 2018-09-03 NOTE — Progress Notes (Signed)
We definitely can follow-up with her we definitely can follow-up with the patient.  We attempted to start her on Spiriva Respimat.  With her mid flow reversibility would you prefer Symbicort?  Part of the problem is that the patient contacted insurance for generics, she needs to contact regarding tier 3 medications.  Arlys John

## 2018-09-04 NOTE — Progress Notes (Signed)
Appreciate the update. I will see her then.

## 2018-09-09 ENCOUNTER — Ambulatory Visit: Payer: Medicare Other | Admitting: Pulmonary Disease

## 2018-09-10 DIAGNOSIS — M0589 Other rheumatoid arthritis with rheumatoid factor of multiple sites: Secondary | ICD-10-CM | POA: Diagnosis not present

## 2018-09-10 DIAGNOSIS — Z79899 Other long term (current) drug therapy: Secondary | ICD-10-CM | POA: Diagnosis not present

## 2018-09-24 ENCOUNTER — Encounter: Payer: Self-pay | Admitting: Pulmonary Disease

## 2018-09-24 ENCOUNTER — Ambulatory Visit (INDEPENDENT_AMBULATORY_CARE_PROVIDER_SITE_OTHER): Payer: Medicare Other | Admitting: Pulmonary Disease

## 2018-09-24 VITALS — BP 130/80 | HR 75 | Ht 62.0 in | Wt 188.0 lb

## 2018-09-24 DIAGNOSIS — J411 Mucopurulent chronic bronchitis: Secondary | ICD-10-CM | POA: Diagnosis not present

## 2018-09-24 DIAGNOSIS — R059 Cough, unspecified: Secondary | ICD-10-CM

## 2018-09-24 DIAGNOSIS — R05 Cough: Secondary | ICD-10-CM

## 2018-09-24 DIAGNOSIS — J449 Chronic obstructive pulmonary disease, unspecified: Secondary | ICD-10-CM | POA: Diagnosis not present

## 2018-09-24 MED ORDER — TIOTROPIUM BROMIDE MONOHYDRATE 2.5 MCG/ACT IN AERS: 2.0000 | INHALATION_SPRAY | Freq: Every day | RESPIRATORY_TRACT | 0 refills | Status: DC

## 2018-09-24 NOTE — Progress Notes (Signed)
Synopsis: Referred in 05/2018 for hx of chronic bronchitis x 2 years.   Subjective:   PATIENT ID: Joy [name] GENDER: female DOB: [birth year], MRN: [id number]   HPI  Chief Complaint  Patient presents with  . Follow-up    pt states breathing is better; been using spiriva and it works well for her; cough had gone away but she feels like it is coming back   [name] is a 78 year old female remote smoker with medical hx significant for RA on methotrexate who presents for follow-up chronic bronchitis with COPD.  Since she started the Spiriva, she reports significant improvement in her coughing and breathing. Rarely coughs and when she does it is now clear sputum instead of the colored. Around the time she was started on Spiriva, she was also treated with a course of Augmentin.  She has a longstanding history of bronchitis and experiences worsening symptoms for 4-8 months of the year. Denies fevers, chills, cough, hemoptysis, chest congestion, chest pain.  Social History: Quit smoking in 1995. Smoked for 30 years x 1ppd.   Environmental Exposures: Denies environmental exposures  I have personally reviewed patient's past medical/family/social history/allergies/current medications.  Past Medical History:  Diagnosis Date  . Ankylosing spondylitis (HCC)   . Arthritis    RHEUMATOID  . Bronchitis   . COPD (chronic obstructive pulmonary disease) (HCC)    CXR 01/11/18 showed mild COPD and chronic bronchitis  . CTS (carpal tunnel syndrome)   . Dysrhythmia    "irregularity" unknown at this time - being evaluated by cardiology  . Essential hypertension 11/24/2015  . GERD (gastroesophageal reflux disease)   . Hypercholesteremia   . Hyperlipidemia 11/24/2015  . Hypertension   . IBS (irritable bowel syndrome)   . Neuropathy   . OAB (overactive bladder)   . Osteoarthritis   . Osteoporosis   . RA (rheumatoid arthritis) (HCC)   . Rheumatoid arthritis (HCC) 11/24/2015  . Seasonal  allergies     Allergies  Allergen Reactions  . Codeine Other (See Comments)    Lump in throat  . Tape Hives and Rash    Paper tape only  . Latex Rash  . Nickel Rash    Bumps Also other metals     Outpatient Medications Prior to Visit  Medication Sig Dispense Refill  . albuterol (PROVENTIL HFA;VENTOLIN HFA) 108 (90 Base) MCG/ACT inhaler Inhale 2 puffs into the lungs every 6 (six) hours as needed for wheezing or shortness of breath. 1 Inhaler 2  . amoxicillin-clavulanate (AUGMENTIN) 875-125 MG tablet Take 1 tablet by mouth 2 (two) times daily. 14 tablet 0  . atorvastatin (LIPITOR) 20 MG tablet Take 1 tablet (20 mg total) by mouth daily. 30 tablet 0  . benzonatate (TESSALON) 100 MG capsule Take 1 capsule (100 mg total) by mouth 3 (three) times daily as needed for cough. 15 capsule 0  . Biotin 1000 MCG tablet Take 1 tablet (1 mg total) by mouth daily. 30 tablet 0  . Cholecalciferol (VITAMIN D3) 25 MCG (1000 UT) CAPS Take 1 capsule (1,000 Units total) by mouth daily. 30 capsule 0  . folic acid (FOLVITE) 1 MG tablet Take 1 tablet (1 mg total) by mouth daily. 30 tablet 0  . gabapentin (NEURONTIN) 300 MG capsule Take 300 mg by mouth at bedtime.   1  . HYDROmorphone (DILAUDID) 2 MG tablet Take 1 tablet (2 mg total) by mouth at bedtime. 7 tablet 0  . inFLIXimab in sodium chloride 0.9 % Inject  into the vein. Pt receives this infusion every 6 weeks    . irbesartan (AVAPRO) 300 MG tablet Take 1 tablet (300 mg total) by mouth daily. 30 tablet 0  . Magnesium 250 MG TABS Take 1 tablet (250 mg total) by mouth daily. 30 tablet 0  . methotrexate (50 MG/ML) 1 g injection Inject 25 mg into the vein once. Pt self administers once a week    . pantoprazole (PROTONIX) 40 MG tablet Take 1 tablet (40 mg total) by mouth daily. 30 tablet 0  . Polyethyl Glycol-Propyl Glycol (SYSTANE) 0.4-0.3 % SOLN Apply 1 drop to eye 3 (three) times daily. 5 mL 0  . sertraline (ZOLOFT) 25 MG tablet Take 25 mg by mouth daily.    .  Tiotropium Bromide Monohydrate (SPIRIVA RESPIMAT) 2.5 MCG/ACT AERS Inhale 2 puffs into the lungs daily. 1 Inhaler 6  . Tiotropium Bromide Monohydrate (SPIRIVA RESPIMAT) 2.5 MCG/ACT AERS Inhale 2 puffs into the lungs daily. 2 Inhaler 0  . vitamin B-12 (CYANOCOBALAMIN) 1000 MCG tablet Take 1 tablet (1,000 mcg total) by mouth daily. 30 tablet 0  . methocarbamol (ROBAXIN) 500 MG tablet Take 1 tablet (500 mg total) by mouth every 6 (six) hours as needed for muscle spasms. (Patient not taking: Reported on 09/24/2018) 90 tablet 0   No facility-administered medications prior to visit.     Review of Systems  Constitutional: Negative for chills, diaphoresis, fever, malaise/fatigue and weight loss.  HENT: Negative for congestion, ear pain and sore throat.   Respiratory: Negative for cough, hemoptysis, sputum production, shortness of breath and wheezing.   Cardiovascular: Negative for chest pain, palpitations and leg swelling.  Gastrointestinal: Negative for abdominal pain, heartburn and nausea.  Musculoskeletal: Negative for joint pain and myalgias.  Skin: Negative for itching and rash.  Neurological: Negative for dizziness, weakness and headaches.  Endo/Heme/Allergies: Does not bruise/bleed easily.  Psychiatric/Behavioral: Negative for depression. The patient is not nervous/anxious.     Objective:   Vitals:   09/24/18 1615  BP: 130/80  Pulse: 75  SpO2: 98%  Weight: 188 lb (85.3 kg)  Height: 5\' 2"  (1.575 m)    Physical Exam: General: Well-appearing, no acute distress HENT: [place], AT, OP clear, MMM Eyes: EOMI, no scleral icterus Respiratory: Clear to auscultation bilaterally.  No crackles, wheezing or rales Cardiovascular: RRR, -M/R/G, no JVD GI: BS+, soft, nontender Extremities:-Edema,-tenderness Neuro: AAO x4, CNII-XII grossly intact Skin: Intact, no rashes or bruising Psych: Normal mood, normal affect  Chest imaging: CXR 01/11/18 - No pulmonary edema, effusion or infiltrate  PFT:    08/21/18 - Mild obstructive defect present with mildly reduced DLCO. TLC normal. No significant bronchodilator effect present however does not preclude benefit of bronchodilator therapy.  Imaging, labs and test noted above have been reviewed independently by me.    Assessment & Plan:   Mr. Rinki Jurkowski is a 78 year old female with rheumatoid arthritis on methotrexate who presents with chronic bronchitis with mild COPD  Chronic bronchitis with mild COPD --Will provide sample Spiriva Respimat 2.36mcg 2 puffs once a day --We will investigate what alternative options we have for inhalers for you --CONTINUE Albuterol every 4-6 hours as needed for shortness of breath or wheezing.  Return in about 3 months (around 12/23/2018).  Chi Mechele Collin, MD [facility] 09/25/2018 10:25 AM  Personal pager: [phone] If unanswered, please page CCM On-call: #[phone]

## 2018-09-24 NOTE — Patient Instructions (Signed)
Chronic bronchitis --Will provide sample Spiriva Respimat 2.55mcg 2 puffs once a day --We will investigate what alternative options we have for inhalers for you

## 2018-09-25 ENCOUNTER — Telehealth: Payer: Self-pay | Admitting: Pulmonary Disease

## 2018-09-25 DIAGNOSIS — J449 Chronic obstructive pulmonary disease, unspecified: Secondary | ICD-10-CM

## 2018-09-25 DIAGNOSIS — J42 Unspecified chronic bronchitis: Secondary | ICD-10-CM | POA: Insufficient documentation

## 2018-09-25 NOTE — Telephone Encounter (Signed)
LMOMTCB x 1 

## 2018-09-25 NOTE — Telephone Encounter (Signed)
Patient seen yesterday 2.5.2020 by Dr Everardo All Patient unable to afford her Spiriva Resp d/t high deductible plan at >$400  Per Dr Everardo All: let's check the cost for Spiriva HH, Anoro and Stiolto to see if her deductible portion will be any cheaper for one of these inhalers.  [employer] and spoke with pharmacist to check deductible cost for Spiriva HH, Anoro, [employer] Per pharmacist, cost for each is slightly cheaper at $381.51.

## 2018-09-26 NOTE — Telephone Encounter (Signed)
Patient returned phone call  Patient phone number is [phone].

## 2018-09-26 NOTE — Telephone Encounter (Signed)
Called and spoke with patient, advised her of message below. Patient stated that price was still expensive for her. Patient stated that she will think about it and call us back next week to give [place] an answer. Will route this over to JE as an FYI.

## 2018-10-06 DIAGNOSIS — Z6835 Body mass index (BMI) 35.0-35.9, adult: Secondary | ICD-10-CM | POA: Diagnosis not present

## 2018-10-06 DIAGNOSIS — M461 Sacroiliitis, not elsewhere classified: Secondary | ICD-10-CM | POA: Insufficient documentation

## 2018-10-06 DIAGNOSIS — M7062 Trochanteric bursitis, left hip: Secondary | ICD-10-CM | POA: Diagnosis not present

## 2018-10-06 DIAGNOSIS — I10 Essential (primary) hypertension: Secondary | ICD-10-CM | POA: Diagnosis not present

## 2018-10-14 ENCOUNTER — Other Ambulatory Visit: Payer: Self-pay | Admitting: Internal Medicine

## 2018-10-17 DIAGNOSIS — M461 Sacroiliitis, not elsewhere classified: Secondary | ICD-10-CM | POA: Diagnosis not present

## 2018-10-17 DIAGNOSIS — Z6835 Body mass index (BMI) 35.0-35.9, adult: Secondary | ICD-10-CM | POA: Diagnosis not present

## 2018-10-17 DIAGNOSIS — I10 Essential (primary) hypertension: Secondary | ICD-10-CM | POA: Diagnosis not present

## 2018-10-22 DIAGNOSIS — M0589 Other rheumatoid arthritis with rheumatoid factor of multiple sites: Secondary | ICD-10-CM | POA: Diagnosis not present

## 2018-10-29 DIAGNOSIS — M150 Primary generalized (osteo)arthritis: Secondary | ICD-10-CM | POA: Diagnosis not present

## 2018-10-29 DIAGNOSIS — E669 Obesity, unspecified: Secondary | ICD-10-CM | POA: Diagnosis not present

## 2018-10-29 DIAGNOSIS — Z1589 Genetic susceptibility to other disease: Secondary | ICD-10-CM | POA: Diagnosis not present

## 2018-10-29 DIAGNOSIS — M2550 Pain in unspecified joint: Secondary | ICD-10-CM | POA: Diagnosis not present

## 2018-10-29 DIAGNOSIS — Z79899 Other long term (current) drug therapy: Secondary | ICD-10-CM | POA: Diagnosis not present

## 2018-10-29 DIAGNOSIS — Z6835 Body mass index (BMI) 35.0-35.9, adult: Secondary | ICD-10-CM | POA: Diagnosis not present

## 2018-10-29 DIAGNOSIS — M0589 Other rheumatoid arthritis with rheumatoid factor of multiple sites: Secondary | ICD-10-CM | POA: Diagnosis not present

## 2018-10-31 DIAGNOSIS — H16223 Keratoconjunctivitis sicca, not specified as Sjogren's, bilateral: Secondary | ICD-10-CM | POA: Diagnosis not present

## 2018-10-31 DIAGNOSIS — H43813 Vitreous degeneration, bilateral: Secondary | ICD-10-CM | POA: Diagnosis not present

## 2018-10-31 DIAGNOSIS — Z961 Presence of intraocular lens: Secondary | ICD-10-CM | POA: Diagnosis not present

## 2018-10-31 DIAGNOSIS — H52223 Regular astigmatism, bilateral: Secondary | ICD-10-CM | POA: Diagnosis not present

## 2018-11-12 ENCOUNTER — Other Ambulatory Visit: Payer: Self-pay | Admitting: Internal Medicine

## 2018-11-25 ENCOUNTER — Encounter: Payer: Self-pay | Admitting: *Deleted

## 2018-11-25 ENCOUNTER — Telehealth: Payer: Self-pay | Admitting: *Deleted

## 2018-11-25 NOTE — Telephone Encounter (Signed)
Patient is returning a call. °

## 2018-11-25 NOTE — Telephone Encounter (Signed)
Patient called from recall list, past due for follow up visit with Dr [place] Called to try to arrange virtual visit. Left message to call back   

## 2018-11-25 NOTE — Telephone Encounter (Signed)
Spoke with patient and scheduled video visit.

## 2018-11-26 ENCOUNTER — Other Ambulatory Visit: Payer: Self-pay

## 2018-11-26 NOTE — Progress Notes (Signed)
Virtual Visit via Video Note   This visit type was conducted due to national recommendations for restrictions regarding the COVID-19 Pandemic (e.g. social distancing) in an effort to limit this patient's exposure and mitigate transmission in our community.  Due to her co-morbid illnesses, this patient is at least at moderate risk for complications without adequate follow up.  This format is felt to be most appropriate for this patient at this time.  All issues noted in this document were discussed and addressed.  A limited physical exam was performed with this format.  Please refer to the patient's chart for her consent to telehealth for [facility].   Evaluation Performed:  Follow-up visit  Date:  11/27/2018   ID:  Glade StanfordCarolyn P Vogl, DOB [birth year], MRN [id number]  Patient Location: Home  Provider Location: Office  PCP:  Juluis RainierBarnes, Elizabeth, MD  Cardiologist:  Chilton Siiffany , MD  Electrophysiologist:  None   Chief Complaint:  Follow up  History of Present Illness:    Joy [name] is a 78 y.o. female who presents via audio/video conferencing for a telehealth visit today.    Joy StarchCarolyn P [place]cheltree is a 78 y.o. female with hypertension, hyperlipidemia, and COPD here for follow up.  She was first seen 11/2015 for an evaluation of pre-surgical cardiovascular risk prior to knee surgery.  Ms. Kemper DurieClarke has a strong family history of cardiac disease and was noted to have EKG changes.  Ms. Kemper DurieClarke fell and fractured her L tibial plateau on 09/02/15. In pre-op clearance appointment she had an EKG that revealed nonspecific ST depression. She was asymptomatic but not very active so she was referred for nuclear stress testing 11/2015 that revealed LVEF 54% and no ischemia.   She reported palpitations and wore a 24 hour Holter that showed occasional PACs and PVCs.   Since her last appointment Ms. Kemper DurieClarke has been well.  Her main complaint is aches and pains.  She had surgery in October and now struggles with  sciatica.  She was doing yoga twice per week prior to COVID-19 but hasn't been doing it lately. She has no exertional chest pain or shortness of breath.  She denies lower extremity edema, orthopnea or PND.  Since her last appointment she was diagnosed with COPD.  She does have a smoking history but quit smoking in 1996.  She has been taking Spiriva which seems to be helping.  She notes that she takes her blood pressure medications at different times of the day.  She often forgets to take it.  Her blood pressure has been mostly around 130 systolic.  The patient does not have symptoms concerning for COVID-19 infection (fever, chills, cough, or new shortness of breath).    Past Medical History:  Diagnosis Date  . Ankylosing spondylitis (HCC)   . Arthritis    RHEUMATOID  . Bronchitis   . COPD (chronic obstructive pulmonary disease) (HCC)    CXR 01/11/18 showed mild COPD and chronic bronchitis  . CTS (carpal tunnel syndrome)   . Dysrhythmia    "irregularity" unknown at this time - being evaluated by cardiology  . Essential hypertension 11/24/2015  . GERD (gastroesophageal reflux disease)   . Hypercholesteremia   . Hyperlipidemia 11/24/2015  . Hypertension   . IBS (irritable bowel syndrome)   . Neuropathy   . OAB (overactive bladder)   . Osteoarthritis   . Osteoporosis   . RA (rheumatoid arthritis) (HCC)   . Rheumatoid arthritis (HCC) 11/24/2015  . Seasonal allergies    Past  Surgical History:  Procedure Laterality Date  . ABDOMINAL HYSTERECTOMY  1995  . BACK SURGERY    . BREAST SURGERY     REDUCTION  . CATARACT EXTRACTION W/PHACO  08/01/2012   Procedure: CATARACT EXTRACTION PHACO AND INTRAOCULAR LENS PLACEMENT (IOC);  Surgeon: Chalmers Guest, MD;  Location: [facility] OR;  Service: Ophthalmology;  Laterality: Right;  . HERNIA REPAIR     RIGHT ING.  . JOINT REPLACEMENT  2014   rt total knee  . TONSILLECTOMY    . 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 albuterol (PROVENTIL HFA;VENTOLIN HFA) 108 (90 Base) MCG/ACT inhaler Inhale 2 puffs into the lungs every 6 (six) hours as needed for wheezing or shortness of breath.  Marland Kitchen atorvastatin (LIPITOR) 20 MG tablet Take 1 tablet (20 mg total) by mouth daily.  . Biotin 1000 MCG tablet Take 1 tablet (1 mg total) by mouth daily.  . Cholecalciferol (VITAMIN D3) 25 MCG (1000 UT) CAPS Take 1 capsule (1,000 Units total) by mouth daily.  . folic acid (FOLVITE) 1 MG tablet Take 1 tablet (1 mg total) by mouth daily.  Marland Kitchen gabapentin (NEURONTIN) 300 MG capsule Take 300 mg by mouth at bedtime.   . inFLIXimab in sodium chloride 0.9 % Inject into the vein. Pt receives this infusion every 6 weeks  . irbesartan (AVAPRO) 300 MG tablet Take 1 tablet (300 mg total) by mouth daily.  . Magnesium 250 MG TABS Take 1 tablet (250 mg total) by mouth daily.  . methotrexate (50 MG/ML) 1 g injection Inject 25 mg into the vein once. Pt self administers once a week  . pantoprazole (PROTONIX) 40 MG tablet Take 1 tablet (40 mg total) by mouth daily.  Bertram Gala Glycol-Propyl Glycol (SYSTANE) 0.4-0.3 % SOLN Apply 1 drop to eye 3 (three) times daily.  . sertraline (ZOLOFT) 25 MG tablet Take 25 mg by mouth daily.  . Tiotropium Bromide Monohydrate (SPIRIVA RESPIMAT) 2.5 MCG/ACT AERS Inhale 2 puffs into the lungs daily.  Marland Kitchen VITAMIN A PO Take by mouth daily.   . vitamin B-12 (CYANOCOBALAMIN) 1000 MCG tablet Take 1 tablet (1,000 mcg total) by mouth daily.     Allergies:   Codeine; Tape; Latex; and Nickel   Social History   Tobacco Use  . Smoking status: Former Smoker    Packs/day: 1.00    Years: 35.00    Pack years: 35.00    Last attempt to quit: 05/29/1995    Years since quitting: 23.5  . Smokeless tobacco: Never Used  Substance Use Topics  . Alcohol use: Yes    Comment: rare  . Drug use: No     Family Hx: The  patient's family history includes Arthritis in her sister, sister, and sister; Cerebral aneurysm in her mother; Heart attack in her father; Heart disease in her father and mother; Hypertension in her sister, sister, sister, sister, and sister; Stroke in her sister.  ROS:   Please see the history of present illness.     All other systems reviewed and are negative.   Prior CV studies:   The following studies were reviewed today:  Lexiscan Myoview 12/02/15:  Normal Lexiscan myoview  Nuclear stress EF: 54%.  There was no ST segment deviation noted during stress.  The study is normal.  This is a low risk study.  24 Hour Holter Monitor 11/2015:  Quality: Fair.  Baseline artifact.  Average heart rate: 79 bpm Max heart rate: 111 bpm Min heart rate: 60 bpm Pauses >2.5 seconds: 0  Occasional PACs and PVCs  Labs/Other Tests and Data Reviewed:    EKG:  No ECG reviewed.  Recent Labs: 06/25/2018: BUN 19; Creatinine 0.8; Potassium 4.2; Sodium 138 08/21/2018: Hemoglobin 12.7; Platelets 291.0   11/29/17: Total cholesterol 188, LDL 105, HDL 68, triglycerides 75 Hemoblobin A1c 5.6% 06/25/18:  Sodium 138, potassium 4.2  10/22/18: Wbc 6.5, Hgb 13.1, hct 38.5, plt 308 BUN 22, Creatinine 0.86, ALT 9, AST 18   Recent Lipid Panel No results found for: CHOL, TRIG, HDL, CHOLHDL, LDLCALC, LDLDIRECT  Wt Readings from Last 3 Encounters:  11/27/18 185 lb (83.9 kg)  09/24/18 188 lb (85.3 kg)  08/21/18 187 lb (84.8 kg)     Objective:    BP 134/84   Pulse 85   Ht 5' 2.5" (1.588 m)   Wt 185 lb (83.9 kg)   BMI 33.30 kg/m  GENERAL: Well-appearing.  No acute distress. HEENT: Pupils equal round.  Oral mucosa unremarkable NECK:  No jugular venous distention, no visible thyromegaly EXT:  No edema, no cyanosis no clubbing SKIN:  No rashes no nodules NEURO:  Speech fluent.  Cranial nerves grossly intact.  Moves all 4 extremities freely PSYCH:  Cognitively intact, oriented to person place  and time   ASSESSMENT & PLAN:    # Hypertension: Blood pressure is mildly above goal.  However she has not yet taken her medication today.  She notes that she takes it sporadically.  I recommended that she associate with something she does every day such as brushing her teeth or eating breakfast.  This might help.  Taking around the same time each day.  Continue irbesartan for now.   # Hyperlipidemia:  Continue atorvastatin.  She will have fasting lipids checked in 1 or 2 months after stay at home orders have been lifted.  COVID-19 Education: The signs and symptoms of COVID-19 were discussed with the patient and how to seek care for testing (follow up with PCP or arrange E-visit).  The importance of social distancing was discussed today.   Time:   Today, I have spent 16 minutes with the patient with telehealth technology discussing the above problems.     Medication Adjustments/Labs and Tests Ordered: Current medicines are reviewed at length with the patient today.  Concerns regarding medicines are outlined above.   Tests Ordered: Orders Placed This Encounter  Procedures  . Lipid panel   Medication Changes: No orders of the defined types were placed in this encounter.   Disposition:  Follow up in 1 year(s)  Signed, Chilton Si, MD  11/27/2018 10:34 AM    [facility]

## 2018-11-27 ENCOUNTER — Telehealth (INDEPENDENT_AMBULATORY_CARE_PROVIDER_SITE_OTHER): Payer: Medicare Other | Admitting: Cardiovascular Disease

## 2018-11-27 VITALS — BP 134/84 | HR 85 | Ht 62.5 in | Wt 185.0 lb

## 2018-11-27 DIAGNOSIS — E7800 Pure hypercholesterolemia, unspecified: Secondary | ICD-10-CM

## 2018-11-27 DIAGNOSIS — E785 Hyperlipidemia, unspecified: Secondary | ICD-10-CM

## 2018-11-27 DIAGNOSIS — I10 Essential (primary) hypertension: Secondary | ICD-10-CM | POA: Diagnosis not present

## 2018-11-27 DIAGNOSIS — Z5181 Encounter for therapeutic drug level monitoring: Secondary | ICD-10-CM

## 2018-11-27 NOTE — Patient Instructions (Signed)
Medication Instructions:  Your physician recommends that you continue on your current medications as directed. Please refer to the Current Medication list given to you today.  If you need a refill on your cardiac medications before your next appointment, please call your pharmacy.   Lab work: FASTING LP AFTER COVID 19 RESTRICTIONS LIFTED  If you have labs (blood work) drawn today and your tests are completely normal, you will receive your results only by: Marland Kitchen MyChart Message (if you have MyChart) OR . A paper copy in the mail If you have any lab test that is abnormal or we need to change your treatment, we will call you to review the results.  Testing/Procedures: NONE  Follow-Up: At [facility], you and your health needs are our priority.  As part of our continuing mission to provide you with exceptional heart care, we have created designated Provider Care Teams.  These Care Teams include your primary Cardiologist (physician) and Advanced Practice Providers (APPs -  Physician Assistants and Nurse Practitioners) who all work together to provide you with the care you need, when you need it. You will need a follow up appointment in 12 months.  Please call our office 2 months in advance to schedule this appointment.  You may see Chilton Si, MD or one of the following Advanced Practice Providers on your designated Care Team:   Corine Shelter, PA-C Judy Pimple, [place] . Marjie Skiff, PA-C

## 2018-12-03 DIAGNOSIS — M0589 Other rheumatoid arthritis with rheumatoid factor of multiple sites: Secondary | ICD-10-CM | POA: Diagnosis not present

## 2018-12-08 DIAGNOSIS — M4316 Spondylolisthesis, lumbar region: Secondary | ICD-10-CM | POA: Diagnosis not present

## 2018-12-08 DIAGNOSIS — M48062 Spinal stenosis, lumbar region with neurogenic claudication: Secondary | ICD-10-CM | POA: Diagnosis not present

## 2018-12-29 ENCOUNTER — Other Ambulatory Visit: Payer: Self-pay | Admitting: Internal Medicine

## 2018-12-29 NOTE — Telephone Encounter (Signed)
Per office and telephone records in January and February, patient has high deductible plan. Our office has looked into alternatives including Spiriva, Anoro and Stioloto which have similar pricing. Can we send a test script to patient's pharmacy for the following inhalers for pricing? Also, do we have an in-clinic pharmacist that is now available to assist [place]?  Atrovent MDI 34 mcg 2 puffs, four times daily Incruse Ellipta 62.5 mcg, one inhalation daily

## 2018-12-29 NOTE — Telephone Encounter (Signed)
Message from the patient Dr.Ellison,  I was diagnosed with chronic bronchitis and prescribed Spiriva Respimat. Since I am unable to afford this medicine, you generously gave me several vials.You stated that you would look for an alternative. Has this happened? Ive looked around and spoken to the manufactorer to no avail. I understand that there are no in office visits?I thought I had an appointment on May 12 at 1:30. I did not receive notice of cancellation.   Thank you for your attention to this matter.   Joy [name] DOB: May 26, 1941  JE, please advise an alternate inhaler other than Spiriva Respimat. Thank you.

## 2018-12-29 NOTE — Telephone Encounter (Signed)
We currently do not have an "in clinic pharmacist". Will reach out to the patient to see exactly what insurance plan she has so I can find the formulary.

## 2018-12-30 ENCOUNTER — Ambulatory Visit: Payer: Medicare Other | Admitting: Pulmonary Disease

## 2019-01-15 DIAGNOSIS — M0589 Other rheumatoid arthritis with rheumatoid factor of multiple sites: Secondary | ICD-10-CM | POA: Diagnosis not present

## 2019-01-19 ENCOUNTER — Telehealth: Payer: Self-pay | Admitting: Pulmonary Disease

## 2019-01-19 MED ORDER — TIOTROPIUM BROMIDE MONOHYDRATE 2.5 MCG/ACT IN AERS: 2.0000 | INHALATION_SPRAY | Freq: Every day | RESPIRATORY_TRACT | 0 refills | Status: DC

## 2019-01-19 NOTE — Telephone Encounter (Signed)
LMTCB Incruse is not on preferred drug list. Spiriva handihaler is on preferred list.

## 2019-01-19 NOTE — Telephone Encounter (Signed)
This was further addressed in February/2020 via telephone note.  See information listed below:  From J. Jones's documentation: Patient seen yesterday 2.5.2020 by Dr Everardo All Patient unable to afford her Spiriva Resp d/t high deductible plan at >$400  Per Dr Everardo All: let's check the cost for Spiriva HH, Anoro and Stiolto to see if her deductible portion will be any cheaper for one of these inhalers.  [employer] and spoke with pharmacist to check deductible cost for Spiriva HH, Anoro, [employer] Per pharmacist, cost for each is slightly cheaper at $381.51.    I am okay with the patient being managed on whichever inhaler will actually be affordable to her.  Unfortunately since she has a high deductible plan almost every inhaler that we place her on will be extremely expensive.  See information listed above.  I would recommend that we provide the patient with 2 samples of Spiriva Respimat 2.5 which is a months worth. We also refer her to [facility] case management for medication management.  Then a pharmacist can work with her regarding cost as well as seeing what other coverage options she may have.  Please place the orders and coordinate.  Elisha Headland, FNP

## 2019-01-19 NOTE — Telephone Encounter (Signed)
Per pt's medlist, pt is on spiriva respimat and it seems like the preferred inhaler is the spiriva handihaler.  Called and spoke with pt to clarify that she was on spiriva respimat and pt stated she was but it cost too much and she is unable to afford it. Stated to pt we would check with Arlys John to see if he would be okay with [place] changing her to spiriva handihaler due to it being the preferred inhaler with insurance and as soon as we heard from him we would call her back and pt verbalized understanding.  Arlys John, please advise if you are okay with [place] switching pt to spiriva handihaler as it is preferred inhaler with pt's insurance.

## 2019-01-19 NOTE — Telephone Encounter (Signed)
Patient is returning phone call.  Patient phone number is [phone].

## 2019-01-19 NOTE — Telephone Encounter (Signed)
Called and spoke with pt stating to her the info per [place]. Pt expressed understanding. Stated to pt that we were going to provide her samples of Spiriva Respimat and refer her to [facility] Care Management for medication management and pt verbalized understanding.  Referral has been placed and samples have been placed up front for pt. Nothing further needed.

## 2019-01-20 DIAGNOSIS — J302 Other seasonal allergic rhinitis: Secondary | ICD-10-CM | POA: Diagnosis not present

## 2019-01-20 DIAGNOSIS — M069 Rheumatoid arthritis, unspecified: Secondary | ICD-10-CM | POA: Diagnosis not present

## 2019-01-20 DIAGNOSIS — E559 Vitamin D deficiency, unspecified: Secondary | ICD-10-CM | POA: Diagnosis not present

## 2019-01-20 DIAGNOSIS — F419 Anxiety disorder, unspecified: Secondary | ICD-10-CM | POA: Diagnosis not present

## 2019-01-20 DIAGNOSIS — I10 Essential (primary) hypertension: Secondary | ICD-10-CM | POA: Diagnosis not present

## 2019-01-20 DIAGNOSIS — E7800 Pure hypercholesterolemia, unspecified: Secondary | ICD-10-CM | POA: Diagnosis not present

## 2019-01-20 DIAGNOSIS — R7301 Impaired fasting glucose: Secondary | ICD-10-CM | POA: Diagnosis not present

## 2019-01-29 ENCOUNTER — Other Ambulatory Visit: Payer: Self-pay

## 2019-01-29 DIAGNOSIS — Z1231 Encounter for screening mammogram for malignant neoplasm of breast: Secondary | ICD-10-CM | POA: Diagnosis not present

## 2019-01-29 NOTE — Patient Outreach (Signed)
[facility]) Care Management  01/29/2019  Joy [name] Sep 07, 1940 [id number]  TELEPHONE SCREENING Referral date: 01/19/19 Referral source: primary MD  Referral reason: Medication assistance Insurance: Medicare  Attempt #1  Telephone call to patient regarding primary MD referral.  Unable to reach. HIPAA compliant voice message left with call back phone number.   PLAN: RNCM will attempt 2nd telephone call to patient within 4 business days.  RNCm will send outreach letter to attempt contact.   Quinn Plowman RN,BSN,CCM [facility] Telephonic  [phone]

## 2019-02-04 ENCOUNTER — Other Ambulatory Visit: Payer: Self-pay

## 2019-02-04 NOTE — Patient Outreach (Signed)
[place] [facility]) Care Management  02/04/2019  Joy [name] December 18, 1940 [id number]  TELEPHONE SCREENING Referral date: 01/19/19 Referral source: primary MD  Referral reason: Medication assistance Insurance: Medicare Attempt #2  Telephone call to patient regarding primary  MD referral. Unable to reach patient. HIPAA compliant voice message left with call back phone number.   PLAN; RNCM will attempt 3rd telephone call to patient within 4 business days.   Quinn Plowman RN,BSN,CCM [facility] Telephonic  [phone]

## 2019-02-05 ENCOUNTER — Other Ambulatory Visit: Payer: Self-pay

## 2019-02-05 NOTE — Patient Outreach (Signed)
[place] [facility]) Care Management  02/05/2019  Joy [name] 30-Jun-1941 [id number]  TELEPHONE SCREENING Referral date:01/19/19 Referral source:primary MD Referral reason:Medication assistance Insurance:Medicare  Telephone call to patient regarding primary MD referral. HIPAA verified by patient. RNCM introduced herself and explained reason for call. Patient states she is not able to afford her inhaler Spiriva.  Patient states she was diagnosed with COPD less than a year ago. She states, "It scared me to death because I lost my sister from COPD."  Patient  States she has been doing well with it. She states does have allergies which can cause it to flare up. Patient states she also has Rheumatoid arthritis and had surgery for spinal stenosis in the fall of 2019.  Patient reports her last visit with Dr. Drema Dallas was 01/20/19.   Patient reports she is on medication for anxiety/ depression for the past year. She denies need for further follow up regarding anxiety/ depression. She states Dr. Drema Dallas assists her with managing this.  RNCM discussed and offered [facility] care management services. Patient verbally agreed.   PLAN:  RNCM will refer patient to [facility] pharmacist and social worker.   Quinn Plowman RN,BSN,CCM [facility] Telephonic  [phone]

## 2019-02-06 ENCOUNTER — Ambulatory Visit: Payer: Self-pay

## 2019-02-09 ENCOUNTER — Telehealth: Payer: Self-pay | Admitting: Pulmonary Disease

## 2019-02-09 ENCOUNTER — Telehealth: Payer: Self-pay | Admitting: Pharmacist

## 2019-02-09 DIAGNOSIS — J411 Mucopurulent chronic bronchitis: Secondary | ICD-10-CM

## 2019-02-09 NOTE — Patient Outreach (Signed)
[place] [facility]) Care Management  [place]   02/09/2019  Joy [name] 1941/05/08 [id number]  Reason for referral: medication assistance with Spiriva  Referral source: 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  Objective: Allergies  Allergen Reactions  . Codeine Other (See Comments)    Lump in throat  . Tape Hives and Rash    Paper tape only  . Latex Rash  . Nickel Rash    Bumps Also other metals    Medications Reviewed Today    Reviewed by Elayne Guerin, [facility] (Pharmacist) on 02/09/19 at 1153  Med List Status: <None>  Medication Order Taking? Sig Documenting Provider Last Dose Status Informant  albuterol (PROVENTIL HFA;VENTOLIN HFA) 108 (90 Base) MCG/ACT inhaler [id number] Yes Inhale 2 puffs into the lungs every 6 (six) hours as needed for wheezing or shortness of breath. Hennie Duos, MD Taking Active   atorvastatin (LIPITOR) 20 MG tablet [id number] Yes Take 1 tablet (20 mg total) by mouth daily. Hennie Duos, MD Taking Active   Biotin 1000 MCG tablet [id number] Yes Take 1 tablet (1 mg total) by mouth daily. Hennie Duos, MD Taking Active   Cholecalciferol (VITAMIN D3) 25 MCG (1000 UT) CAPS [id number] Yes Take 1 capsule (1,000 Units total) by mouth daily. Hennie Duos, MD Taking Active   folic acid (FOLVITE) 1 MG tablet [id number] Yes Take 1 tablet (1 mg total) by mouth daily. Hennie Duos, MD Taking Active   gabapentin (NEURONTIN) 300 MG capsule [id number] Yes Take 300 mg by mouth at bedtime.  [provider] Taking Active Self           Med Note Nat Christen   Fri May 30, 2018 11:32 AM)    inFLIXimab in sodium chloride 0.9 % [id number] Yes Inject into the vein. Pt receives this infusion every 6  weeks [provider] Taking Active Self  irbesartan (AVAPRO) 300 MG tablet [id number] Yes Take 1 tablet (300 mg total) by mouth daily. Hennie Duos, MD Taking Active   Magnesium 250 MG TABS [id number] Yes Take 1 tablet (250 mg total) by mouth daily. Hennie Duos, MD Taking Active   Methotrexate Sodium (METHOTREXATE, PF,) 50 MG/2ML injection [id number] Yes INJECT 1 ML ONCE WEEKLY. DISCARD VIAL AFTER USE [provider] Taking Active   pantoprazole (PROTONIX) 40 MG tablet [id number] Yes Take 1 tablet (40 mg total) by mouth daily. Hennie Duos, MD Taking Active   Polyethyl Glycol-Propyl Glycol (SYSTANE) 0.4-0.3 % Bailey Mech [id number] Yes Apply 1 drop to eye 3 (three) times daily. Hennie Duos, MD Taking Active   sertraline (ZOLOFT) 25 MG tablet [id number] Yes Take 25 mg by mouth daily. [provider] Taking Active Self  Tiotropium Bromide Monohydrate (SPIRIVA RESPIMAT) 2.5 MCG/ACT AERS [id number] Yes Inhale 2 puffs into the lungs daily. Lauraine Rinne, NP Taking Active   VITAMIN A PO [id number] Yes Take by mouth daily.  [provider] Taking Active   vitamin B-12 (CYANOCOBALAMIN) 1000 MCG tablet [id number] Yes Take 1 tablet (1,000 mcg total) by mouth daily. Hennie Duos, MD Taking Active           Assessment:  Drugs sorted by system:  Neurologic/Psychologic: Sertaline, Gabapentin,   Cardiovascular: Atorvastatin, Irbesartan,   Pulmonary/Allergy: Albuterol HFA, Spiriva  Gastrointestinal: Pantoprazole,  Rheumatology: Methotrexate, Infliximab  Vitamins/Minerals/Supplements: Biotin, Vitamin A, Vitamin B 12, Vitamin D, Folic Acid, Magnesium,   Miscellaneous: Systane  Medication Review Findings:  Methotrexate/Pantoprazole--Proton pump inhibitors may decrease the renal elimination of methotrexate and potentially increase toxicity related to methotrexate. o No recent MTX level but patient has been taking MTX for quite a while without  complaint. She is taking folic acid as well.  Medication Assistance Findings:  Medication assistance needs identified: Spiriva.  -Patient is over income for LIS/Extra Help -She may qualify to receive Spiriva from [employer]'s program and Proventil HFA from [facility] -Patient communicated understanding about the necessary financial documentation.  -Patient may also qualify to receive Proventil HFA from [facility] Patient Assistance Program. Additional medication assistance options reviewed with patient as warranted:  No other options identified  Plan: I will route patient assistance letter to [facility] pharmacy technician who will coordinate patient assistance program application process for medications listed above.  [facility] pharmacy technician will assist with obtaining all required documents from both patient and provider(s) and submit application(s) once completed.    Route note to Elisha Headland Follow up with patient in 4-6 weeks Pattricia Boss will follow patient for patient assistance.  Beecher Mcardle, PharmD, BCACP [facility] Clinical Pharmacist [phone]

## 2019-02-09 NOTE — Telephone Encounter (Signed)
-----   Message from Elayne Guerin, [facility] sent at 02/09/2019 12:53 PM EDT ----- Provider Warner Mccreedy,  Please see the attached [facility] Pharmacist's note. Patient was referred to [place] due to medication affordability issues with Spiriva.  She may qualify to receive Spiriva from [place] Patient Assistance program and Proventil HFA from [facility] Patient Assistance Program. If deemed therapeutically appropriate, please sign, fax, and/or mail the applications that will be sent to your office back to [facility]. [facility] will handle the correspondence with the programs.  Thank you so much for your time and consideration.  Blessings,  Elayne Guerin, PharmD, [place] Clinical Pharmacist [phone]

## 2019-02-10 ENCOUNTER — Other Ambulatory Visit: Payer: Self-pay | Admitting: Pharmacy Technician

## 2019-02-10 NOTE — Patient Outreach (Signed)
[place] [facility]) Care Management  02/10/2019  CHERYLYN SUNDBY 10/06/1940 [id number]                           Medication Assistance Referral  Referral From: [place]  Medication/Company: Stann Ore / BI Patient application portion:  Mailed Provider application portion: Faxed  to Wyn Quaker, NP  Medication/Company: Proventil HFA / [employer] Patient application portion:  Education officer, museum portion: Magazine features editor to Wyn Quaker, NP   Follow up:  Will follow up with patient in 5-10 business days to confirm application(s) have been received.  Claudio Mondry P. Alphonsine Minium, [place] Management [phone]

## 2019-02-10 NOTE — Telephone Encounter (Signed)
02/10/2019 1232  If patient may qualify for Spiriva Respimat patient assistance through BI, then I think we can proceed forward with Spiriva Respimat 2.5.  Lauren, please be on the look out for the information has been mailed to [place] from [facility].  Okay also to proceed forward with [place] patient assistance program so patient can receive Proventil.  Wyn Quaker, FNP

## 2019-02-11 ENCOUNTER — Other Ambulatory Visit: Payer: Self-pay | Admitting: *Deleted

## 2019-02-12 MED ORDER — ALBUTEROL SULFATE HFA 108 (90 BASE) MCG/ACT IN AERS: 2.0000 | INHALATION_SPRAY | Freq: Four times a day (QID) | RESPIRATORY_TRACT | 12 refills | Status: DC | PRN

## 2019-02-12 NOTE — Telephone Encounter (Signed)
Received paperwork from [facility] regarding medication for Proventil patient assistance. Aaron Edelman has signed form.  RX printed and sent back with completed form to [facility] [place].   Nothing further needed at this time

## 2019-02-18 ENCOUNTER — Other Ambulatory Visit: Payer: Self-pay | Admitting: Pharmacy Technician

## 2019-02-18 NOTE — Patient Outreach (Signed)
[place] [facility]) Care Management  02/18/2019  JON KASPAREK 1941-05-26 [id number]  Successful outreach call placed to patient in regards to [facility] application for Spiriva and [employer] application for Proventil.  Spoke to patient, HIPAA identifiers verified.  Patient informed she has received the applications. Patient also informed she mailed back the applications on 0/73/7106.  Will followup with patient in 10-14 business days if application has not been received.  Analeya Luallen P. Franciso Dierks, [place] Management [phone]

## 2019-02-26 DIAGNOSIS — M0589 Other rheumatoid arthritis with rheumatoid factor of multiple sites: Secondary | ICD-10-CM | POA: Diagnosis not present

## 2019-02-26 DIAGNOSIS — Z79899 Other long term (current) drug therapy: Secondary | ICD-10-CM | POA: Diagnosis not present

## 2019-02-27 DIAGNOSIS — Z03818 Encounter for observation for suspected exposure to other biological agents ruled out: Secondary | ICD-10-CM | POA: Diagnosis not present

## 2019-02-27 DIAGNOSIS — Z7189 Other specified counseling: Secondary | ICD-10-CM | POA: Diagnosis not present

## 2019-03-02 DIAGNOSIS — I10 Essential (primary) hypertension: Secondary | ICD-10-CM | POA: Diagnosis not present

## 2019-03-02 DIAGNOSIS — M4316 Spondylolisthesis, lumbar region: Secondary | ICD-10-CM | POA: Diagnosis not present

## 2019-03-02 DIAGNOSIS — Z6836 Body mass index (BMI) 36.0-36.9, adult: Secondary | ICD-10-CM | POA: Diagnosis not present

## 2019-03-03 DIAGNOSIS — Z20828 Contact with and (suspected) exposure to other viral communicable diseases: Secondary | ICD-10-CM | POA: Diagnosis not present

## 2019-03-03 DIAGNOSIS — Z7189 Other specified counseling: Secondary | ICD-10-CM | POA: Diagnosis not present

## 2019-03-04 ENCOUNTER — Encounter: Payer: Self-pay | Admitting: *Deleted

## 2019-03-04 ENCOUNTER — Other Ambulatory Visit: Payer: Self-pay | Admitting: *Deleted

## 2019-03-04 DIAGNOSIS — M48061 Spinal stenosis, lumbar region without neurogenic claudication: Secondary | ICD-10-CM | POA: Insufficient documentation

## 2019-03-04 NOTE — Patient Outreach (Signed)
[facility]) Care Management  [facility] Care Manager  03/04/2019   BRAYLI KLINGBEIL 1941/08/01 [id number]   RN Health Coach Initial Assessment  Referral Date:  02/05/2019 Referral Source:  Primary MD Reason for Referral:  Medication Assistance Insurance:  Medicare   Outreach Attempt:  Successful telephone outreach to patient for introduction and initial telephone assessment.  HIPAA verified with patient.  RN Health Coach introduced self and role.  Patient verbally agrees to Disease Management outreaches.  Patient completed initial telephone assessment.  Social:  Patient lives at home with her daughter.  Independent with ADLs and IADLs.  Ambulates independently and denies any falls in the last year.  Transports herself to her medical appointments. Patient reports her daughter is in the process of getting married and unsure of where she will be staying.  States her plans are to move into a Independent/Assisted Living after her daughter marries.  [facility] Social Work referral discussed and patient verbally agrees for senior living resources.  DME in the home include:  Blood pressure cuff, straight cane, rolling walker, upper and lower dentures, bedside commode, grab bars in the shower, wheelchair, raised toilet seat with rails, reading glasses and crutches.  Conditions:  Per chart review and discussion with patient, PMH include but not limited to:  Hypertension, hyperlipidemia, COPD, bilateral knee replacement surgery, spinal stenosis with surgery, rheumatoid arthritis, sciatica, bronchitis, GERD, neuropathy, overactive bladder, bilateral cataract removal and osteoarthritis.  Denies any recent emergency room visits or hospitalizations.  States she was diagnosed with COPD and a few frequent chronic bronchitis exacerbations last year.  Denies any recent shortness of breath and reports not having to use her rescue inhaler in the last month.  Monitors blood pressure almost daily with ranges of  130/70-80's.  Medications:  Reports taking about 12 medications daily and manages medications herself with weekly pill box fills without difficulties.  Does endorse having trouble affording her Spiriva inhaler and is working with [facility] for medication assistance.  Encounter Medications:  Outpatient Encounter Medications as of 03/04/2019  Medication Sig  . albuterol (VENTOLIN HFA) 108 (90 Base) MCG/ACT inhaler Inhale 2 puffs into the lungs every 6 (six) hours as needed for wheezing or shortness of breath.  Marland Kitchen atorvastatin (LIPITOR) 20 MG tablet Take 1 tablet (20 mg total) by mouth daily.  . Biotin 1000 MCG tablet Take 1 tablet (1 mg total) by mouth daily.  . Cholecalciferol (VITAMIN D3) 25 MCG (1000 UT) CAPS Take 1 capsule (1,000 Units total) by mouth daily.  . folic acid (FOLVITE) 1 MG tablet Take 1 tablet (1 mg total) by mouth daily.  Marland Kitchen gabapentin (NEURONTIN) 300 MG capsule Take 300 mg by mouth at bedtime.   . inFLIXimab in sodium chloride 0.9 % Inject into the vein. Pt receives this infusion every 6 weeks  . irbesartan (AVAPRO) 300 MG tablet Take 1 tablet (300 mg total) by mouth daily.  . Magnesium 250 MG TABS Take 1 tablet (250 mg total) by mouth daily.  . Methotrexate Sodium (METHOTREXATE, PF,) 50 MG/2ML injection INJECT 1 ML ONCE WEEKLY. DISCARD VIAL AFTER USE  . pantoprazole (PROTONIX) 40 MG tablet Take 1 tablet (40 mg total) by mouth daily.  Bertram Gala Glycol-Propyl Glycol (SYSTANE) 0.4-0.3 % SOLN Apply 1 drop to eye 3 (three) times daily.  . sertraline (ZOLOFT) 25 MG tablet Take 25 mg by mouth daily.  . Tiotropium Bromide Monohydrate (SPIRIVA RESPIMAT) 2.5 MCG/ACT AERS Inhale 2 puffs into the lungs daily.  Marland Kitchen VITAMIN A PO  Take by mouth daily.   . vitamin B-12 (CYANOCOBALAMIN) 1000 MCG tablet Take 1 tablet (1,000 mcg total) by mouth daily.   No facility-administered encounter medications on file as of 03/04/2019.     Functional Status:  In your present state of health, do you  have any difficulty performing the following activities: 03/04/2019 06/04/2018  Hearing? N N  Vision? Y N  Comment recent cataract surgery, near vision is bad -  Difficulty concentrating or making decisions? Y N  Comment forgetfull at times -  Walking or climbing stairs? N Y  Comment takes time pt gets SOB  Dressing or bathing? N N  Doing errands, shopping? N N  Preparing Food and eating ? N -  Using the Toilet? N -  Do you have problems with loss of bowel control? N -  Managing your Medications? N -  Managing your Finances? N -  Housekeeping or managing your Housekeeping? N -  Some recent data might be hidden    Fall/Depression Screening: Fall Risk  03/04/2019  Falls in the past year? 0  Risk for fall due to : Medication side effect;Impaired mobility  Follow up Falls evaluation completed;Education provided;Falls prevention discussed   PHQ 2/9 Scores 03/04/2019 02/05/2019  PHQ - 2 Score 0 1   [facility] CM Care Plan Problem One     Most Recent Value  Care Plan Problem One  Knowledge deficiet related to diagnosis of COPD and self care management.  Role Documenting the Problem One  [place] for Problem One  Active  [facility] Long Term Goal   Patient will report no hospitalizations within the next 90 days.  [facility] Long Term Goal Start Date  03/04/19  Interventions for Problem One Long Term Goal  Current care plan and goals reviewed with patient, encouraged to keep and attend scheduled appointments, reviewed medications and encouraged medication compliance, encouraged patient to continue to work with [place] for assistance with Stann Ore [place] Work referral placed for Independent/Assisted Living Placement resources, Living Well with COPD Educational Packet sent to patient, reviewed signs and symptoms of COPD exacerbation and introduced COPD action plan     Appointments:  Attended appointment with primary care provider, Dr. Drema Dallas on 01/20/2019.  Reports she usually follows up  every 6 months and needs to schedule follow up appointment.  Patient has seen Dr. Loanne Drilling, Pulmonary on 09/22/2018 and Dr. Oval Linsey with Cardiology on 11/27/2018.  Advanced Directives:  Reports having a Living Will in place and does not wish to make changes at this time.  Requesting information on [place].   Consent:  [facility] services reviewed and discussed.  Patient verbally agrees to Disease Management outreaches and [place] Work referral for senior living resources.  Continues to work with [place] for medication assistance.  Plan: RN Health Coach will send primary MD barriers letter. RN Health Coach will route initial telephone assessment note to primary MD. [place] will send patient [place]. RN Health Coach will send patient 2020 Calendar Booklet. RN Health Coach will send patient Emergency planning/management officer. RN Health Coach will send patient Living Well with COPD Educational Packet. RN Health Coach will place [facility] Social Work referral for senior living resources. RN Health Coach will make next telephone outreach to patient within the month of August.   Jadiel Schmieder RN [place] [phone] [email or web address]

## 2019-03-04 NOTE — Addendum Note (Signed)
Addended by: Hubert Azure D on: 03/04/2019 05:19 PM   Modules accepted: Orders

## 2019-03-05 ENCOUNTER — Other Ambulatory Visit: Payer: Self-pay | Admitting: Pharmacy Technician

## 2019-03-05 NOTE — Patient Outreach (Signed)
[place] [facility]) Care Management  03/05/2019  Joy [name] 03/05/1941 [id number]    Received all necessary documents and signatures from both patient and provider for [employer] patient assistance for Proventil HFA and BI patient assistance for Sprivia Respimat.  Submitted completed Financial risk analyst.  Submitted completed BI application via fax.  Will followup with both companies in 7-14 business days.  Killian Schwer P. Larwence Tu, [place] Management [phone]

## 2019-03-06 ENCOUNTER — Other Ambulatory Visit: Payer: Self-pay

## 2019-03-06 NOTE — Patient Outreach (Signed)
[place] [facility]) Care Management  03/06/2019  Joy [name] [birth year] [id number]   Social work referral received from [facility], Hubert Azure.  "Patient needs assistance with Independent/Assisted Living Resources. Lives with daughter and daughter is soon to be married and possibly moving. Patient is not wanting to stay with daughter after she is married." Successful outreach to patient today.  Daughter is supposed to get married in December and patient wishes to move to ALF. BSW will mail list of facilities in [facility] area and follow up with her within the next two weeks to ensure receipt.  Ronn Melena, BSW Social Worker [phone]

## 2019-03-09 ENCOUNTER — Other Ambulatory Visit: Payer: Self-pay

## 2019-03-09 ENCOUNTER — Ambulatory Visit: Payer: Medicare Other | Attending: Neurosurgery | Admitting: Physical Therapy

## 2019-03-09 ENCOUNTER — Encounter: Payer: Self-pay | Admitting: Physical Therapy

## 2019-03-09 DIAGNOSIS — M5441 Lumbago with sciatica, right side: Secondary | ICD-10-CM | POA: Diagnosis not present

## 2019-03-09 DIAGNOSIS — R293 Abnormal posture: Secondary | ICD-10-CM | POA: Insufficient documentation

## 2019-03-09 DIAGNOSIS — M5442 Lumbago with sciatica, left side: Secondary | ICD-10-CM | POA: Insufficient documentation

## 2019-03-09 DIAGNOSIS — R2689 Other abnormalities of gait and mobility: Secondary | ICD-10-CM | POA: Diagnosis not present

## 2019-03-09 DIAGNOSIS — R262 Difficulty in walking, not elsewhere classified: Secondary | ICD-10-CM | POA: Diagnosis not present

## 2019-03-09 DIAGNOSIS — G8929 Other chronic pain: Secondary | ICD-10-CM

## 2019-03-09 NOTE — Therapy (Signed)
[facility] [address]  [address] [place], [place], [address] Phone: [phone]   Fax:  [phone]  Physical Therapy Evaluation  Patient Details  Name: Joy [name] MRN: [id number] Date of Birth: Dec 11, 1940 Referring Provider (PT): Coletta Memos, MD   Encounter Date: 03/09/2019  PT End of Session - 03/09/19 1129    Visit Number  1    Number of Visits  13    Date for PT Re-Evaluation  04/20/19    Authorization Type  Medicare & AARP    PT Start Time  [phone]    PT Stop Time  1020    PT Time Calculation (min)  47 min    Activity Tolerance  Patient tolerated treatment well;Patient limited by pain    Behavior During Therapy  [facility] for tasks assessed/performed       Past Medical History:  Diagnosis Date  . Ankylosing spondylitis (HCC)   . Arthritis    RHEUMATOID  . Bronchitis   . COPD (chronic obstructive pulmonary disease) (HCC)    CXR 01/11/18 showed mild COPD and chronic bronchitis  . CTS (carpal tunnel syndrome)   . Dysrhythmia    "irregularity" unknown at this time - being evaluated by cardiology  . Essential hypertension 11/24/2015  . GERD (gastroesophageal reflux disease)   . Hypercholesteremia   . Hyperlipidemia 11/24/2015  . Hypertension   . IBS (irritable bowel syndrome)   . Neuropathy   . OAB (overactive bladder)   . Osteoarthritis   . Osteoporosis   . RA (rheumatoid arthritis) (HCC)   . Rheumatoid arthritis (HCC) 11/24/2015  . Seasonal allergies     Past Surgical History:  Procedure Laterality Date  . ABDOMINAL HYSTERECTOMY  1995  . BACK SURGERY    . BREAST SURGERY     REDUCTION  . CATARACT EXTRACTION W/PHACO  08/01/2012   Procedure: CATARACT EXTRACTION PHACO AND INTRAOCULAR LENS PLACEMENT (IOC);  Surgeon: Chalmers Guest, MD;  Location: [facility] OR;  Service: Ophthalmology;  Laterality: Right;  . HERNIA REPAIR     RIGHT ING.  . JOINT REPLACEMENT  2014   rt total knee  . TONSILLECTOMY    . TOTAL KNEE  ARTHROPLASTY Left 12/26/2015   Procedure: TOTAL KNEE ARTHROPLASTY;  Surgeon: Ollen Gross, MD;  Location: [facility] ORS;  Service: Orthopedics;  Laterality: Left;    There were no vitals filed for this visit.   Subjective Assessment - 03/09/19 0934    Subjective  Patient reports chronic LBP for the past 12-15 years. Underwent L2-3 posterior lumbar fusion in Oct 2019 and reports that she also had another lumbar fusion about 5 years prior. LBP is located across B LB with radiation down B posterior legs stopping at knees. Reports peripheral neuropathy- N/T in B toes and fingertips. Pain worse with prolonged walking and standing. Feels like she walks bent over. Takes a 30 min walk around [place] 1x/week with use of shopping cart and she feels okay tolerating this. Feels like her LBP has slowly gotten worse and has not gotten much better with surgery.    Pertinent History  RA, osteoporosis, OA, neuropathy, HTN, HLD, GERD, dysrhythmia, carpal tunnel, COPD, ankylosing spondylitis, L TKA 2017, R TKA 2014, back surgery    Limitations  Lifting;Standing;Walking;House hold activities    How long can you sit comfortably?  unlimited    How long can you stand comfortably?  15-20 min    How long can you walk comfortably?  10-15 min  Patient Stated Goals  "would like to be able to walk my dog"    Currently in Pain?  Yes    Pain Score  0-No pain    Pain Location  Back    Pain Orientation  Right;Left;Lower    Pain Descriptors / Indicators  Sore    Pain Type  Chronic pain         [facility] PT Assessment - 03/09/19 0953      Assessment   Medical Diagnosis  Lumbar spondylolisthesis    Referring Provider (PT)  Coletta MemosKyle Cabbell, MD    Onset Date/Surgical Date  --   several years   Next MD Visit  not scheduled    Prior Therapy  yes      Precautions   Precautions  None   osteoporosis, neuropathy     Balance Screen   Has the patient fallen in the past 6 months  No    Has the patient had a decrease in activity level  because of a fear of falling?   Yes    Is the patient reluctant to leave their home because of a fear of falling?   Yes      Home Environment   Living Environment  Private residence    Type of Home  House    Home Access  Stairs to enter    Entrance Stairs-Number of Steps  2    Entrance Stairs-Rails  None    Home Layout  Two level    Alternate Level Stairs-Number of Steps  17    Alternate Level Stairs-Rails  Right      Prior Function   Level of Independence  Independent    Vocation  Retired;Volunteer work    Leisure  trip to [place] in July 2021      Cognition   Overall Cognitive Status  Within Functional Limits for tasks assessed      Observation/Other Assessments   Focus on Therapeutic Outcomes (FOTO)   Lumbar: 56 (44% limited, 45% predicted)      Sensation   Light Touch  Appears Intact      Coordination   Gross Motor Movements are Fluid and Coordinated  Yes      Posture/Postural Control   Posture/Postural Control  Postural limitations    Postural Limitations  Rounded Shoulders;Forward head;Flexed trunk      ROM / Strength   AROM / PROM / Strength  AROM;Strength      AROM   AROM Assessment Site  Lumbar    Lumbar Flexion  toes   moderate L LBP   Lumbar Extension  severely limited   moderate pain   Lumbar - Right Side Bend  jt line   mild LBP   Lumbar - Left Side Bend  distal thigh   mild LBP   Lumbar - Right Rotation  severely limited   mild LBP   Lumbar - Left Rotation  mildly limited   mild LBP     Strength   Strength Assessment Site  Hip;Knee;Ankle    Right/Left Hip  Right;Left    Right Hip Flexion  4/5   pain in lateral thigh   Right Hip ABduction  4/5    Right Hip ADduction  4/5    Left Hip Flexion  4+/5    Left Hip ABduction  4/5    Left Hip ADduction  4/5    Right/Left Knee  Right;Left    Right Knee Flexion  4+/5    Right Knee Extension  5/5    Left Knee Flexion  4+/5    Left Knee Extension  5/5    Right/Left Ankle  Right;Left    Right Ankle  Dorsiflexion  5/5    Right Ankle Plantar Flexion  4+/5    Left Ankle Dorsiflexion  5/5    Left Ankle Plantar Flexion  4+/5      Flexibility   Soft Tissue Assessment /Muscle Length  yes    Hamstrings  B WFL    Quadriceps  B quads and hip flexors mildly tight    Piriformis  B severely tight      Palpation   Spinal mobility  TTP with gentle palpation over T9-L5 spinous process    Palpation comment  TTP over B superior glutes and piriformis       Ambulation/Gait   Assistive device  None    Gait Pattern  Step-to pattern;Step-through pattern;Decreased step length - right;Decreased step length - left;Trunk flexed;Narrow base of support                Objective measurements completed on examination: See above findings.              PT Education - 03/09/19 1129    Education provided  Yes    Education Details  prognosis, POC, HEP    Person(s) Educated  Patient    Methods  Explanation;Demonstration;Tactile cues;Verbal cues;Handout    Comprehension  Verbalized understanding;Returned demonstration       PT Short Term Goals - 03/09/19 1149      PT SHORT TERM GOAL #1   Title  Independent with initial HEP     Time  3    Period  Weeks    Status  New    Target Date  03/30/19        PT Long Term Goals - 03/09/19 1149      PT LONG TERM GOAL #1   Title  Patient to be independent with advanced HEP.    Time  6    Period  Weeks    Status  New    Target Date  04/20/19      PT LONG TERM GOAL #2   Title  Patient to demonstrate B hip strength >=4+/5.    Time  6    Period  Weeks    Status  New    Target Date  04/20/19      PT LONG TERM GOAL #3   Title  Patient to report 50% improvement in pain with lumbar AROM.    Time  6    Period  Weeks    Status  New    Target Date  04/20/19      PT LONG TERM GOAL #4   Title  Patient to report tolerance of 45 min of standing/walking without onset of pain.    Time  6    Period  Weeks    Status  New    Target Date   04/20/19             Plan - 03/09/19 1131    Clinical Impression Statement  Patient is an 78y/o F presenting to OPPT with c/o chronic LBP for several years. Underwent L2-3 posterior lumbar fusion in 2019 and recalls another lumbar fusion about 5 years prior. LBP is located across B sides, with radiation of pain down B posterior thighs stopping at knees. Worse with prolonged walking and standing. Better when flexed over shopping cart. Patient today with B hip  weakness, limited and painful lumbar AROM- worst with extension, decreased LE flexibility, tenderness along midline of spine along T9-L5, tenderness in B superior glutes and pisiforms, postural limitations and gait deviations. Educated on gentle stretching HEP- patient reported understanding. Would benefit from skilled PT services 2x/week for 6 weeks to address aforementioned impairments.    Personal Factors and Comorbidities  Age;Comorbidity 3+;Time since onset of injury/illness/exacerbation;Fitness;Past/Current Experience    Comorbidities  RA, osteoporosis, OA, neuropathy, HTN, HLD, GERD, dysrhythmia, carpal tunnel, COPD, ankylosing spondylitis, L TKA 2017, R TKA 2014, back surgery    Examination-Activity Limitations  Bend;Squat;Stairs;Carry;Stand;Transfers;Lift;Locomotion Level;Reach Overhead    Examination-Participation Restrictions  Church;Cleaning;Shop;Community Activity;Driving;Interpersonal Relationship;Laundry;Meal Prep    Stability/Clinical Decision Making  Evolving/Moderate complexity    Clinical Decision Making  Moderate    Rehab Potential  Good    PT Frequency  2x / week    PT Duration  6 weeks    PT Treatment/Interventions  ADLs/Self Care Home Management;Cryotherapy;Electrical Stimulation;Moist Heat;Balance training;Neuromuscular re-education;Therapeutic exercise;Therapeutic activities;Functional mobility training;Stair training;Patient/family education;Manual techniques;Gait training;Ultrasound;Taping;Energy conservation;Dry  needling;Passive range of motion    PT Next Visit Plan  reassess HEP    Consulted and Agree with Plan of Care  Patient       Patient will benefit from skilled therapeutic intervention in order to improve the following deficits and impairments:  Abnormal gait, Hypomobility, Decreased activity tolerance, Decreased strength, Pain, Decreased balance, Decreased mobility, Difficulty walking, Increased muscle spasms, Improper body mechanics, Decreased range of motion, Postural dysfunction, Impaired flexibility  Visit Diagnosis: 1. Chronic bilateral low back pain with bilateral sciatica   2. Abnormal posture   3. Other abnormalities of gait and mobility        Problem List Patient Active Problem List   Diagnosis Date Noted  . Spinal stenosis of lumbar region 03/04/2019  . COPD with chronic bronchitis (HCC) 09/25/2018  . Mucopurulent chronic bronchitis (HCC) 08/21/2018  . Cough 08/21/2018  . 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 Acute blood loss as cause of postoperative anemia 06/21/2018  . 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 Spondylosis 06/13/2018  . Status post lumbar spinal fusion 06/13/2018  . Status post lumbar spine surgery for decompression of spinal cord 06/13/2018  . 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 History of total knee arthroplasty, right 09/20/2017  . OA (osteoarthritis) of knee 12/26/2015  . Essential hypertension 11/24/2015  . Hyperlipidemia 11/24/2015  . Rheumatoid arthritis (HCC) 11/24/2015  . Lumbar spondylosis 07/21/2014     Anette GuarneriYevgeniya Phoua Hoadley, PT, DPT 03/09/19 11:55 AM   [facility] [address]  [address] [place], [place], [address] Phone: [phone]   Fax:  [phone]  Name: Joy [name] MRN: [id number] Date of Birth: 17-Jul-1941

## 2019-03-12 ENCOUNTER — Ambulatory Visit: Payer: Medicare Other

## 2019-03-12 ENCOUNTER — Other Ambulatory Visit: Payer: Self-pay

## 2019-03-12 DIAGNOSIS — R2689 Other abnormalities of gait and mobility: Secondary | ICD-10-CM

## 2019-03-12 DIAGNOSIS — R262 Difficulty in walking, not elsewhere classified: Secondary | ICD-10-CM | POA: Diagnosis not present

## 2019-03-12 DIAGNOSIS — M5441 Lumbago with sciatica, right side: Secondary | ICD-10-CM | POA: Diagnosis not present

## 2019-03-12 DIAGNOSIS — G8929 Other chronic pain: Secondary | ICD-10-CM | POA: Diagnosis not present

## 2019-03-12 DIAGNOSIS — R293 Abnormal posture: Secondary | ICD-10-CM | POA: Diagnosis not present

## 2019-03-12 DIAGNOSIS — M5442 Lumbago with sciatica, left side: Secondary | ICD-10-CM | POA: Diagnosis not present

## 2019-03-12 NOTE — Therapy (Signed)
[facility] [address]  [address] [place], [place], [address] Phone: [phone]   Fax:  [phone]  Physical Therapy Treatment  Patient Details  Name: Joy [name] MRN: [id number] Date of Birth: [birth year] Referring Provider (PT): Coletta MemosKyle Cabbell, MD   Encounter Date: 03/12/2019  PT End of Session - 03/12/19 0934    Visit Number  2    Number of Visits  13    Date for PT Re-Evaluation  04/20/19    Authorization Type  Medicare & AARP    PT Start Time  [phone]    PT Stop Time  1010    PT Time Calculation (min)  39 min    Activity Tolerance  Patient tolerated treatment well;Patient limited by pain    Behavior During Therapy  [facility] for tasks assessed/performed       Past Medical History:  Diagnosis Date  . Ankylosing spondylitis (HCC)   . Arthritis    RHEUMATOID  . Bronchitis   . COPD (chronic obstructive pulmonary disease) (HCC)    CXR 01/11/18 showed mild COPD and chronic bronchitis  . CTS (carpal tunnel syndrome)   . Dysrhythmia    "irregularity" unknown at this time - being evaluated by cardiology  . Essential hypertension 11/24/2015  . GERD (gastroesophageal reflux disease)   . Hypercholesteremia   . Hyperlipidemia 11/24/2015  . Hypertension   . IBS (irritable bowel syndrome)   . Neuropathy   . OAB (overactive bladder)   . Osteoarthritis   . Osteoporosis   . RA (rheumatoid arthritis) (HCC)   . Rheumatoid arthritis (HCC) 11/24/2015  . Seasonal allergies     Past Surgical History:  Procedure Laterality Date  . ABDOMINAL HYSTERECTOMY  1995  . BACK SURGERY    . BREAST SURGERY     REDUCTION  . CATARACT EXTRACTION W/PHACO  08/01/2012   Procedure: CATARACT EXTRACTION PHACO AND INTRAOCULAR LENS PLACEMENT (IOC);  Surgeon: Chalmers Guestoy Whitaker, MD;  [facility];  Service: Ophthalmology;  Laterality: Right;  . HERNIA REPAIR     RIGHT ING.  . JOINT REPLACEMENT  2014   rt total knee  . TONSILLECTOMY    . TOTAL KNEE  ARTHROPLASTY Left 12/26/2015   Procedure: TOTAL KNEE ARTHROPLASTY;  Surgeon: Ollen GrossFrank Aluisio, MD;  Location: [facility];  Service: Orthopedics;  Laterality: Left;    There were no vitals filed for this visit.  Subjective Assessment - 03/12/19 0933    Subjective  Pt. notes she is doing well today.  Had no issues with HEP.    Pertinent History  RA, osteoporosis, OA, neuropathy, HTN, HLD, GERD, dysrhythmia, carpal tunnel, COPD, ankylosing spondylitis, L TKA 2017, R TKA 2014, back surgery    Patient Stated Goals  "would like to be able to walk my dog"    Currently in Pain?  Yes    Pain Score  4     Pain Location  Back    Pain Orientation  Lower    Pain Descriptors / Indicators  Sharp    Pain Type  Chronic pain    Pain Frequency  Constant    Aggravating Factors   Unsure    Multiple Pain Sites  No                       [facility] Adult PT Treatment/Exercise - 03/12/19 0001      Lumbar Exercises: Stretches   Lower Trunk Rotation  --   5" x 10 reps  Piriformis Stretch  Right;Left;2 reps;30 seconds    Piriformis Stretch Limitations  KTOS    Other Lumbar Stretch Exercise  "Butterfly" groin stretch x 30 sec       Lumbar Exercises: Aerobic   Recumbent Bike  lvl 2, 6 min       Lumbar Exercises: Seated   Sit to Stand  10 reps    Sit to Stand Limitations  min UE pushoff       Lumbar Exercises: Supine   Pelvic Tilt  10 reps    Pelvic Tilt Limitations  tactile cueing from therapist required     Clam  10 reps;3 seconds    Clam Limitations  red TB at knees    Cues provided for abdom. bracing    Bent Knee Raise  10 reps;3 seconds   Cues provided for brace march    Bent Knee Raise Limitations  red TB at knees                PT Short Term Goals - 03/12/19 0939      PT SHORT TERM GOAL #1   Title  Independent with initial HEP     Time  3    Period  Weeks    Status  On-going    Target Date  03/30/19        PT Long Term Goals - 03/12/19 0940      PT LONG TERM GOAL #1    Title  Patient to be independent with advanced HEP.    Time  6    Period  Weeks    Status  On-going      PT LONG TERM GOAL #2   Title  Patient to demonstrate B hip strength >=4+/5.    Time  6    Period  Weeks    Status  On-going      PT LONG TERM GOAL #3   Title  Patient to report 50% improvement in pain with lumbar AROM.    Time  6    Period  Weeks    Status  On-going      PT LONG TERM GOAL #4   Title  Patient to report tolerance of 45 min of standing/walking without onset of pain.    Time  6    Period  Weeks    Status  On-going            Plan - 03/12/19 0940    Clinical Impression Statement  Pt. reporting no issues with HEP. Did require cueing to avoid painful arc with LTR HEP activity today.  Progressed lumbopelvic strengthening and stability today without issue.  Pt. does require frequent cueing to stay on task as pt. with tendency to get side-tracked with conversation.  Ended visit with no change in low-level LBP thus modalities deferred.    Personal Factors and Comorbidities  Age;Comorbidity 3+;Time since onset of injury/illness/exacerbation;Fitness;Past/Current Experience    Comorbidities  RA, osteoporosis, OA, neuropathy, HTN, HLD, GERD, dysrhythmia, carpal tunnel, COPD, ankylosing spondylitis, L TKA 2017, R TKA 2014, back surgery    Rehab Potential  Good    PT Treatment/Interventions  ADLs/Self Care Home Management;Cryotherapy;Electrical Stimulation;Moist Heat;Balance training;Neuromuscular re-education;Therapeutic exercise;Therapeutic activities;Functional mobility training;Stair training;Patient/family education;Manual techniques;Gait training;Ultrasound;Taping;Energy conservation;Dry needling;Passive range of motion    PT Next Visit Plan  Progress lumbopelvic strengthening; standing LE strengthening per pt. tolerance; modalities prn    Consulted and Agree with Plan of Care  Patient       Patient will benefit from  skilled therapeutic intervention in order to  improve the following deficits and impairments:  Abnormal gait, Hypomobility, Decreased activity tolerance, Decreased strength, Pain, Decreased balance, Decreased mobility, Difficulty walking, Increased muscle spasms, Improper body mechanics, Decreased range of motion, Postural dysfunction, Impaired flexibility  Visit Diagnosis: 1. Chronic bilateral low back pain with bilateral sciatica   2. Abnormal posture   3. Other abnormalities of gait and mobility        Problem List Patient Active Problem List   Diagnosis Date Noted  . Spinal stenosis of lumbar region 03/04/2019  . COPD with chronic bronchitis ([place]) 09/25/2018  . Mucopurulent chronic bronchitis ([place]) 08/21/2018  . Cough 08/21/2018  . 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 Acute blood loss as cause of postoperative anemia 06/21/2018  . 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 Spondylosis 06/13/2018  . Status post lumbar spinal fusion 06/13/2018  . Status post lumbar spine surgery for decompression of spinal cord 06/13/2018  . 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 History of total knee arthroplasty, right 09/20/2017  . OA (osteoarthritis) of knee 12/26/2015  . Essential hypertension 11/24/2015  . Hyperlipidemia 11/24/2015  . Rheumatoid arthritis ([place]) 11/24/2015  . Lumbar spondylosis 07/21/2014    Bess Harvest, PTA 03/12/19 11:30 AM   [facility] [place] [facility] [address]  [place], [place], [address] Phone: [phone]   Fax:  [phone]  Name: Joy [name] MRN: [id number] Date of Birth: August 05, 1941

## 2019-03-16 ENCOUNTER — Other Ambulatory Visit: Payer: Self-pay

## 2019-03-16 ENCOUNTER — Ambulatory Visit: Payer: Medicare Other

## 2019-03-16 DIAGNOSIS — R293 Abnormal posture: Secondary | ICD-10-CM

## 2019-03-16 DIAGNOSIS — M5442 Lumbago with sciatica, left side: Secondary | ICD-10-CM | POA: Diagnosis not present

## 2019-03-16 DIAGNOSIS — R2689 Other abnormalities of gait and mobility: Secondary | ICD-10-CM | POA: Diagnosis not present

## 2019-03-16 DIAGNOSIS — G8929 Other chronic pain: Secondary | ICD-10-CM | POA: Diagnosis not present

## 2019-03-16 DIAGNOSIS — R262 Difficulty in walking, not elsewhere classified: Secondary | ICD-10-CM | POA: Diagnosis not present

## 2019-03-16 DIAGNOSIS — M5441 Lumbago with sciatica, right side: Secondary | ICD-10-CM | POA: Diagnosis not present

## 2019-03-16 NOTE — Therapy (Signed)
[facility] [place] [address]  [address] [place], [place], [address] Phone: [phone]   Fax:  [phone]  Physical Therapy Treatment  Patient Details  Name: Joy [name] MRN: [id number] Date of Birth: 05-04-1941 Referring Provider (PT): Coletta MemosKyle Cabbell, MD   Encounter Date: 03/16/2019  PT End of Session - 03/16/19 0950    Visit Number  3    Number of Visits  13    Date for PT Re-Evaluation  04/20/19    Authorization Type  Medicare & AARP    PT Start Time  [id number]    PT Stop Time  1025    PT Time Calculation (min)  54 min    Activity Tolerance  Patient tolerated treatment well;Patient limited by pain    Behavior During Therapy  [facility] for tasks assessed/performed       Past Medical History:  Diagnosis Date  . Ankylosing spondylitis (HCC)   . Arthritis    RHEUMATOID  . Bronchitis   . COPD (chronic obstructive pulmonary disease) (HCC)    CXR 01/11/18 showed mild COPD and chronic bronchitis  . CTS (carpal tunnel syndrome)   . Dysrhythmia    "irregularity" unknown at this time - being evaluated by cardiology  . Essential hypertension 11/24/2015  . GERD (gastroesophageal reflux disease)   . Hypercholesteremia   . Hyperlipidemia 11/24/2015  . Hypertension   . IBS (irritable bowel syndrome)   . Neuropathy   . OAB (overactive bladder)   . Osteoarthritis   . Osteoporosis   . RA (rheumatoid arthritis) (HCC)   . Rheumatoid arthritis (HCC) 11/24/2015  . Seasonal allergies     Past Surgical History:  Procedure Laterality Date  . ABDOMINAL HYSTERECTOMY  1995  . BACK SURGERY    . BREAST SURGERY     REDUCTION  . CATARACT EXTRACTION W/PHACO  08/01/2012   Procedure: CATARACT EXTRACTION PHACO AND INTRAOCULAR LENS PLACEMENT (IOC);  Surgeon: Chalmers Guestoy Whitaker, MD;  [facility];  Service: Ophthalmology;  Laterality: Right;  . HERNIA REPAIR     RIGHT ING.  . JOINT REPLACEMENT  2014   rt total knee  . TONSILLECTOMY    . TOTAL KNEE  ARTHROPLASTY Left 12/26/2015   Procedure: TOTAL KNEE ARTHROPLASTY;  Surgeon: Ollen GrossFrank Aluisio, MD;  Location: [facility] ORS;  Service: Orthopedics;  Laterality: Left;    There were no vitals filed for this visit.  Subjective Assessment - 03/16/19 0943    Subjective  Reports some increased LBP over weekend without known trigger.  "It's time for my infusion, so I'm not suprised it's hurting more".    Pertinent History  RA, osteoporosis, OA, neuropathy, HTN, HLD, GERD, dysrhythmia, carpal tunnel, COPD, ankylosing spondylitis, L TKA 2017, R TKA 2014, back surgery    Patient Stated Goals  "would like to be able to walk my dog"    Currently in Pain?  Yes    Pain Score  4     Pain Location  Back    Pain Orientation  Lower    Pain Descriptors / Indicators  Sharp   "stiffness"   Pain Type  Chronic pain    Multiple Pain Sites  No                       [facility] Adult PT Treatment/Exercise - 03/16/19 0001      Lumbar Exercises: Stretches   Passive Hamstring Stretch  Right;Left;1 rep;30 seconds    Passive Hamstring Stretch Limitations  strap     Lower Trunk Rotation  --   5" x 10 reps   Piriformis Stretch  Left;2 reps;30 seconds   L only as she notes a "trigger point" in L buttocks    Piriformis Stretch Limitations  L KTOS      Lumbar Exercises: Aerobic   Nustep  Lvl 3, 56min (UE/LE)      Lumbar Exercises: Supine   Clam  3 seconds;15 reps    Clam Limitations  red TB at knees    Alternating clam shell  red band   Bent Knee Raise  15 reps;3 seconds   Brace march    Bent Knee Raise Limitations  red TB at knees       Lumbar Exercises: Sidelying   Clam  15 reps;Left    Clam Limitations  no resistance       Modalities   Modalities  Electrical Stimulation;Moist Heat      Moist Heat Therapy   Number Minutes Moist Heat  15 Minutes    Moist Heat Location  Lumbar Spine      Electrical Stimulation   Electrical Stimulation Location  lumbar spine     Electrical Stimulation Action  IFC     Electrical Stimulation Parameters  80-150Hz , intensity to pt. tolerance, 15'    Electrical Stimulation Goals  Pain               PT Short Term Goals - 03/12/19 0939      PT SHORT TERM GOAL #1   Title  Independent with initial HEP     Time  3    Period  Weeks    Status  On-going    Target Date  03/30/19        PT Long Term Goals - 03/12/19 0940      PT LONG TERM GOAL #1   Title  Patient to be independent with advanced HEP.    Time  6    Period  Weeks    Status  On-going      PT LONG TERM GOAL #2   Title  Patient to demonstrate B hip strength >=4+/5.    Time  6    Period  Weeks    Status  On-going      PT LONG TERM GOAL #3   Title  Patient to report 50% improvement in pain with lumbar AROM.    Time  6    Period  Weeks    Status  On-going      PT LONG TERM GOAL #4   Title  Patient to report tolerance of 45 min of standing/walking without onset of pain.    Time  6    Period  Weeks    Status  On-going            Plan - 03/16/19 8938    Clinical Impression Statement  Pt. noting increased LBP/"stiffness" over this weekend without known trigger.  Increased focus on proximal hip strengthening activities today with LE stretching as pt. noting L buttocks "trigger point" with increased tenderness.  Some improvement in L buttocks tenderness to end session however initiated moist heat/E-stim to lumbar spine/buttocks for reduction in tone and pain.  Will continue ot progress toward goals.    Personal Factors and Comorbidities  Age;Comorbidity 3+;Time since onset of injury/illness/exacerbation;Fitness;Past/Current Experience    Comorbidities  RA, osteoporosis, OA, neuropathy, HTN, HLD, GERD, dysrhythmia, carpal tunnel, COPD, ankylosing spondylitis, L TKA 2017, R TKA 2014, back surgery  Rehab Potential  Good    PT Treatment/Interventions  ADLs/Self Care Home Management;Cryotherapy;Electrical Stimulation;Moist Heat;Balance training;Neuromuscular re-education;Therapeutic  exercise;Therapeutic activities;Functional mobility training;Stair training;Patient/family education;Manual techniques;Gait training;Ultrasound;Taping;Energy conservation;Dry needling;Passive range of motion    PT Next Visit Plan  Progress lumbopelvic strengthening; standing LE strengthening per pt. tolerance; modalities prn    Consulted and Agree with Plan of Care  Patient       Patient will benefit from skilled therapeutic intervention in order to improve the following deficits and impairments:  Abnormal gait, Hypomobility, Decreased activity tolerance, Decreased strength, Pain, Decreased balance, Decreased mobility, Difficulty walking, Increased muscle spasms, Improper body mechanics, Decreased range of motion, Postural dysfunction, Impaired flexibility  Visit Diagnosis: 1. Chronic bilateral low back pain with bilateral sciatica   2. Abnormal posture   3. Other abnormalities of gait and mobility        Problem List Patient Active Problem List   Diagnosis Date Noted  . Spinal stenosis of lumbar region 03/04/2019  . COPD with chronic bronchitis (HCC) 09/25/2018  . Mucopurulent chronic bronchitis (HCC) 08/21/2018  . Cough 08/21/2018  . 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 Acute blood loss as cause of postoperative anemia 06/21/2018  . 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 Spondylosis 06/13/2018  . Status post lumbar spinal fusion 06/13/2018  . Status post lumbar spine surgery for decompression of spinal cord 06/13/2018  . 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 History of total knee arthroplasty, right 09/20/2017  . OA (osteoarthritis) of knee 12/26/2015  . Essential hypertension 11/24/2015  . Hyperlipidemia 11/24/2015  . Rheumatoid arthritis (HCC) 11/24/2015  . Lumbar spondylosis 07/21/2014    Kermit Balo, PTA 03/16/19 12:13 PM    [facility] [address]  [address] [place], [place], [address] Phone: [phone]   Fax:  [phone]  Name: Joy [name] MRN: [id number] Date of Birth: November 30, 1940

## 2019-03-18 ENCOUNTER — Telehealth: Payer: Self-pay

## 2019-03-18 NOTE — Telephone Encounter (Signed)
Call made to patient, made her aware we received paperwork stating she was denied Patient Assistance. I made her aware at this point she needs call her insurance and find out what inhalers are on her formulary and I would send this message to her provider for any further recommendations.   Joy [name], patient was denied patient for spiriva through [facility]. Patient to call insurance for inhalers on her formulary. Thanks.

## 2019-03-18 NOTE — Telephone Encounter (Signed)
Noted  

## 2019-03-18 NOTE — Telephone Encounter (Signed)
Triage,Please route to Dr. Loanne Drilling for future follow-up.  She can make a suggestion regarding which inhaler she would like the patient to be maintained on.  Patient has been previously maintained on Spiriva Respimat 2.5 based off of samples in our office.  Patient has a high deductible plan making most inhalers unaffordable.  Patient is also been referred to [facility] for patient assistance.  Patient has been denied patient assistance through [place] for Spiriva Respimat.Joy Quaker, FNP

## 2019-03-20 ENCOUNTER — Other Ambulatory Visit: Payer: Self-pay

## 2019-03-20 ENCOUNTER — Other Ambulatory Visit: Payer: Self-pay | Admitting: Pharmacy Technician

## 2019-03-20 ENCOUNTER — Encounter: Payer: Self-pay | Admitting: Physical Therapy

## 2019-03-20 ENCOUNTER — Ambulatory Visit: Payer: Medicare Other | Admitting: Physical Therapy

## 2019-03-20 VITALS — BP 122/70 | HR 84

## 2019-03-20 DIAGNOSIS — R2689 Other abnormalities of gait and mobility: Secondary | ICD-10-CM

## 2019-03-20 DIAGNOSIS — G8929 Other chronic pain: Secondary | ICD-10-CM

## 2019-03-20 DIAGNOSIS — R293 Abnormal posture: Secondary | ICD-10-CM

## 2019-03-20 DIAGNOSIS — R262 Difficulty in walking, not elsewhere classified: Secondary | ICD-10-CM | POA: Diagnosis not present

## 2019-03-20 DIAGNOSIS — M5442 Lumbago with sciatica, left side: Secondary | ICD-10-CM | POA: Diagnosis not present

## 2019-03-20 DIAGNOSIS — M5441 Lumbago with sciatica, right side: Secondary | ICD-10-CM | POA: Diagnosis not present

## 2019-03-20 NOTE — Patient Outreach (Signed)
[facility]) Care Management  03/20/2019  Joy [name] Apr 16, 1941 [id number]   ADDENDUM  Care coordination call placed to [employer] in regards to [name] application.  Spoke to [place] who informed they had received the application and had in turn mailed out the attestation form and original application to the patient on 03/18/2019. She informed it can take between 5-10 business days for it to arrive at the patient's home. She informed once they receive the form and application back then [employer] will continue to process the application and mail out the medication.  Will followup with patient in 5-7 business days to inquire if she has received the attestation form.  Srinika Delone P. Elric Tirado, [place] Management [phone]

## 2019-03-20 NOTE — Patient Outreach (Signed)
[facility]) Care Management  03/20/2019  Joy [name] Sep 16, 1940 [id number]   Attempted to contact patient to ensure receipt of ALF options in the [facility] area that was mailed on 03/06/19.  Left voicemail message.  Will attempt to reach again within four business days.  Ronn Melena, BSW Social Worker [phone]

## 2019-03-20 NOTE — Patient Outreach (Signed)
[facility]) Care Management  03/20/2019  Joy [name] 04-16-1941 [id number]  Care coordination call placed to [place] in regards to patient's application for Spiriva Respimat.  Spoke to [place] who informed patient was over income for the program. She informed patient could appeal the decision and possibly be approved only if the out of pocket cost is greater than 3% of the annual income. Patient would need to submit proof of the cost of the medication in question.  Will route note to [place] to discuss the determination and possible other options with patient.  Nioma Mccubbins P. Symir Mah, [place] Management [phone]

## 2019-03-20 NOTE — Therapy (Signed)
[facility] [address]  [address] [place], [place], [address] Phone: [phone]   Fax:  [phone]  Physical Therapy Treatment  Patient Details  Name: Joy [name] MRN: [id number] Date of Birth: 1940/12/25 Referring Provider (PT): Coletta Memos, MD   Encounter Date: 03/20/2019  PT End of Session - 03/20/19 1211    Visit Number  4    Number of Visits  13    Date for PT Re-Evaluation  04/20/19    Authorization Type  Medicare & AARP    PT Start Time  [id number]    PT Stop Time  1012    PT Time Calculation (min)  41 min    Activity Tolerance  Patient tolerated treatment well    Behavior During Therapy  [facility] for tasks assessed/performed       Past Medical History:  Diagnosis Date  . Ankylosing spondylitis (HCC)   . Arthritis    RHEUMATOID  . Bronchitis   . COPD (chronic obstructive pulmonary disease) (HCC)    CXR 01/11/18 showed mild COPD and chronic bronchitis  . CTS (carpal tunnel syndrome)   . Dysrhythmia    "irregularity" unknown at this time - being evaluated by cardiology  . Essential hypertension 11/24/2015  . GERD (gastroesophageal reflux disease)   . Hypercholesteremia   . Hyperlipidemia 11/24/2015  . Hypertension   . IBS (irritable bowel syndrome)   . Neuropathy   . OAB (overactive bladder)   . Osteoarthritis   . Osteoporosis   . RA (rheumatoid arthritis) (HCC)   . Rheumatoid arthritis (HCC) 11/24/2015  . Seasonal allergies     Past Surgical History:  Procedure Laterality Date  . ABDOMINAL HYSTERECTOMY  1995  . BACK SURGERY    . BREAST SURGERY     REDUCTION  . CATARACT EXTRACTION W/PHACO  08/01/2012   Procedure: CATARACT EXTRACTION PHACO AND INTRAOCULAR LENS PLACEMENT (IOC);  Surgeon: Chalmers Guest, MD;  Location: [facility] OR;  Service: Ophthalmology;  Laterality: Right;  . HERNIA REPAIR     RIGHT ING.  . JOINT REPLACEMENT  2014   rt total knee  . TONSILLECTOMY    . TOTAL KNEE ARTHROPLASTY Left 12/26/2015   Procedure: TOTAL KNEE ARTHROPLASTY;  Surgeon: Ollen Gross, MD;  Location: [facility];  Service: Orthopedics;  Laterality: Left;    Vitals:   03/20/19 0939  BP: 122/70  Pulse: 84  SpO2: 99%    Subjective Assessment - 03/20/19 0932    Subjective  Reports that her energy levels have been down. Has been tolerating her PT appointments well.    Pertinent History  RA, osteoporosis, OA, neuropathy, HTN, HLD, GERD, dysrhythmia, carpal tunnel, COPD, ankylosing spondylitis, L TKA 2017, R TKA 2014, back surgery    Patient Stated Goals  "would like to be able to walk my dog"    Currently in Pain?  Yes    Pain Score  7     Pain Location  Back    Pain Orientation  Lower    Pain Descriptors / Indicators  Sharp    Pain Type  Chronic pain    Multiple Pain Sites  Yes    Pain Score  3    Pain Location  Knee    Pain Orientation  Right;Left    Pain Descriptors / Indicators  Aching    Pain Type  Chronic pain  [facility] Adult PT Treatment/Exercise - 03/20/19 0001      Lumbar Exercises: Stretches   Passive Hamstring Stretch  Right;30 seconds;2 reps;Left    Passive Hamstring Stretch Limitations  strap     Piriformis Stretch  Left;30 seconds;Right;1 rep    Piriformis Stretch Limitations  KTOS to tolerance    Other Lumbar Stretch Exercise  "Butterfly" groin stretch 2x30 sec with self-OP       Lumbar Exercises: Aerobic   Recumbent Bike  lvl 2, 6 min       Lumbar Exercises: Standing   Row  Strengthening;Both;10 reps;Theraband    Theraband Level (Row)  Level 2 (Red)    Row Limitations  cues to widen BOS and maintain chest up    Other Standing Lumbar Exercises  B shoulder ER with red TB x10 standing against wall for upright posture      Lumbar Exercises: Supine   Clam  3 seconds;15 reps    Clam Limitations  red TB at knees    cues for core contraction   Other Supine Lumbar Exercises  resisted hip flexion with red TB x10    Other Supine Lumbar Exercises  LTR x20 cues  to slow down             PT Education - 03/20/19 1211    Education provided  Yes    Education Details  update to [employer]) Educated  Patient    Methods  Explanation;Demonstration;Tactile cues;Verbal cues;Handout    Comprehension  Verbalized understanding;Returned demonstration       PT Short Term Goals - 03/12/19 0939      PT SHORT TERM GOAL #1   Title  Independent with initial HEP     Time  3    Period  Weeks    Status  On-going    Target Date  03/30/19        PT Long Term Goals - 03/12/19 0940      PT LONG TERM GOAL #1   Title  Patient to be independent with advanced HEP.    Time  6    Period  Weeks    Status  On-going      PT LONG TERM GOAL #2   Title  Patient to demonstrate B hip strength >=4+/5.    Time  6    Period  Weeks    Status  On-going      PT LONG TERM GOAL #3   Title  Patient to report 50% improvement in pain with lumbar AROM.    Time  6    Period  Weeks    Status  On-going      PT LONG TERM GOAL #4   Title  Patient to report tolerance of 45 min of standing/walking without onset of pain.    Time  6    Period  Weeks    Status  On-going            Plan - 03/20/19 1211    Clinical Impression Statement  Patient arrived to session without new complaints. Reported mild SOB after warm up d/t hx of chronic Bronchitis but vitals WFL and patient reporting resolution of symptoms after sitting rest break. Tolerated progressive hip and core strengthening without LBP. Reported muscle spasm in R HS with resisted hip flexion, which was relieved with HS stretch. Patient reporting difficulty with her balance secondary to forward flexed posture. Completed session working on activation of periscapular musculature with use of the wall  to encourage upright posture. Patient requiring cues to widen BOS to assist with stability in standing. Updated HEP with exercises that were well-tolerated today. Patient reported understanding and reporting improvement  in LBP compared to beginning of session.    Personal Factors and Comorbidities  Age;Comorbidity 3+;Time since onset of injury/illness/exacerbation;Fitness;Past/Current Experience    Comorbidities  RA, osteoporosis, OA, neuropathy, HTN, HLD, GERD, dysrhythmia, carpal tunnel, COPD, ankylosing spondylitis, L TKA 2017, R TKA 2014, back surgery    Rehab Potential  Good    PT Treatment/Interventions  ADLs/Self Care Home Management;Cryotherapy;Electrical Stimulation;Moist Heat;Balance training;Neuromuscular re-education;Therapeutic exercise;Therapeutic activities;Functional mobility training;Stair training;Patient/family education;Manual techniques;Gait training;Ultrasound;Taping;Energy conservation;Dry needling;Passive range of motion    PT Next Visit Plan  Progress lumbopelvic strengthening; standing LE strengthening per pt. tolerance; modalities prn    Consulted and Agree with Plan of Care  Patient       Patient will benefit from skilled therapeutic intervention in order to improve the following deficits and impairments:  Abnormal gait, Hypomobility, Decreased activity tolerance, Decreased strength, Pain, Decreased balance, Decreased mobility, Difficulty walking, Increased muscle spasms, Improper body mechanics, Decreased range of motion, Postural dysfunction, Impaired flexibility  Visit Diagnosis: 1. Chronic bilateral low back pain with bilateral sciatica   2. Abnormal posture   3. Other abnormalities of gait and mobility   4. Difficulty in walking, not elsewhere classified        Problem List Patient Active Problem List   Diagnosis Date Noted  . Spinal stenosis of lumbar region 03/04/2019  . COPD with chronic bronchitis (HCC) 09/25/2018  . Mucopurulent chronic bronchitis (HCC) 08/21/2018  . Cough 08/21/2018  . 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 Acute blood loss as cause of postoperative anemia 06/21/2018  . 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  Spondylosis 06/13/2018  . Status post lumbar spinal fusion 06/13/2018  . Status post lumbar spine surgery for decompression of spinal cord 06/13/2018  . 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 History of total knee arthroplasty, right 09/20/2017  . OA (osteoarthritis) of knee 12/26/2015  . Essential hypertension 11/24/2015  . Hyperlipidemia 11/24/2015  . Rheumatoid arthritis (HCC) 11/24/2015  . Lumbar spondylosis 07/21/2014     Anette Guarneri, PT, DPT 03/20/19 12:16 PM   [facility] [address]  [address] [place], [place], [address] Phone: [phone]   Fax:  [phone]  Name: Joy [name] MRN: [id number] Date of Birth: 10/21/1940

## 2019-03-23 ENCOUNTER — Other Ambulatory Visit: Payer: Self-pay

## 2019-03-23 ENCOUNTER — Telehealth: Payer: Self-pay | Admitting: Pharmacist

## 2019-03-23 ENCOUNTER — Ambulatory Visit: Payer: Medicare Other | Attending: Neurosurgery

## 2019-03-23 DIAGNOSIS — R262 Difficulty in walking, not elsewhere classified: Secondary | ICD-10-CM | POA: Diagnosis not present

## 2019-03-23 DIAGNOSIS — M5442 Lumbago with sciatica, left side: Secondary | ICD-10-CM | POA: Diagnosis not present

## 2019-03-23 DIAGNOSIS — R2689 Other abnormalities of gait and mobility: Secondary | ICD-10-CM

## 2019-03-23 DIAGNOSIS — G8929 Other chronic pain: Secondary | ICD-10-CM | POA: Diagnosis not present

## 2019-03-23 DIAGNOSIS — R293 Abnormal posture: Secondary | ICD-10-CM

## 2019-03-23 DIAGNOSIS — M5441 Lumbago with sciatica, right side: Secondary | ICD-10-CM | POA: Insufficient documentation

## 2019-03-23 NOTE — Telephone Encounter (Signed)
-----   Message from Jason Fila, CPhT sent at 03/20/2019 11:50 AM EDT ----- Juluis Rainier- Can you discuss with patient the denial and appeal/other options. Pt denied over income. Her income is [phone] which is at 345%FPL, Bi requires them to be less than 200%fpl. She can appeal but based on the formula (sent out in an email earlier in the year) the cost of the med must be >3% of income. So based on my math her copay would need to be $115 or greater. Still working on [employer].

## 2019-03-23 NOTE — Patient Outreach (Signed)
[facility]) Care Management  03/23/2019  KASHEENA SAMBRANO 07-11-1941 [id number]  Patient was called to review the status of her patient assistance applications. HIPAA identifiers were obtained.   [employer] said the patient was over income for Spiriva.  We have not heard back from [employer] yet for Proventil HFA.  Patient communicated understanding and wondered if there were any other options.  Incruse Ellipta is in the same drug class family as Stann Ore and is available from [employer].  However, the [employer] program requires patient's to spend at least $600 in out-of-pocket medication expenses.   If she has spent at least $600, she will be sent an application for Incruse.  Patient was not sure how much she has sent this year but said she would check her most recent EOB and call me back.  Plan: Await a call back from the patient. Sharee Pimple [place] will follow for patient assistance. Call patient back in 3-4 weeks.   Elayne Guerin, PharmD, [place] Clinical Pharmacist [phone]

## 2019-03-23 NOTE — Therapy (Signed)
[facility] [address]  [place], [place], [address] Phone: [phone]   Fax:  [phone]  Physical Therapy Treatment  Patient Details  Name: Joy [name] MRN: [id number] Date of Birth: [birth year] Referring Provider (PT): Ashok Pall, MD   Encounter Date: 03/23/2019  PT End of Session - 03/23/19 0941    Visit Number  5    Number of Visits  13    Date for PT Re-Evaluation  04/20/19    Authorization Type  Medicare & AARP    PT Start Time  0932    PT Stop Time  1015    PT Time Calculation (min)  43 min    Activity Tolerance  Patient tolerated treatment well    Behavior During Therapy  [facility] for tasks assessed/performed       Past Medical History:  Diagnosis Date  . Ankylosing spondylitis ([place])   . Arthritis    RHEUMATOID  . Bronchitis   . COPD (chronic obstructive pulmonary disease) ([place])    CXR 01/11/18 showed mild COPD and chronic bronchitis  . CTS (carpal tunnel syndrome)   . Dysrhythmia    "irregularity" unknown at this time - being evaluated by cardiology  . Essential hypertension 11/24/2015  . GERD (gastroesophageal reflux disease)   . Hypercholesteremia   . Hyperlipidemia 11/24/2015  . Hypertension   . IBS (irritable bowel syndrome)   . Neuropathy   . OAB (overactive bladder)   . Osteoarthritis   . Osteoporosis   . RA (rheumatoid arthritis) ([place])   . Rheumatoid arthritis ([place]) 11/24/2015  . Seasonal allergies     Past Surgical History:  Procedure Laterality Date  . ABDOMINAL HYSTERECTOMY  1995  . BACK SURGERY    . BREAST SURGERY     REDUCTION  . CATARACT EXTRACTION W/PHACO  08/01/2012   Procedure: CATARACT EXTRACTION PHACO AND INTRAOCULAR LENS PLACEMENT (IOC);  Surgeon: Marylynn Pearson, MD;  Location: [facility];  Service: Ophthalmology;  Laterality: Right;  . HERNIA REPAIR     RIGHT ING.  . JOINT REPLACEMENT  2014   rt total knee  . TONSILLECTOMY    . 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  Subjective Assessment - 03/23/19 0939    Subjective  Pt. doing well today.    Pertinent History  RA, osteoporosis, OA, neuropathy, HTN, HLD, GERD, dysrhythmia, carpal tunnel, COPD, ankylosing spondylitis, L TKA 2017, R TKA 2014, back surgery    Patient Stated Goals  "would like to be able to walk my dog"    Currently in Pain?  Yes    Pain Score  4     Pain Location  Back    Pain Orientation  Lower    Pain Type  Chronic pain    Pain Radiating Towards  Pain radiating down lateral/posterior thigh to knees    Pain Frequency  Constant                       [facility] Adult PT Treatment/Exercise - 03/23/19 0001      Lumbar Exercises: Stretches   Passive Hamstring Stretch  Right;Left;1 rep;30 seconds    Passive Hamstring Stretch Limitations  strap     Piriformis Stretch  Right;Left;1 rep;30 seconds    Piriformis Stretch Limitations  KTOS  Figure 4 Stretch  1 rep;30 seconds    Figure 4 Stretch Limitations  with towel assistance       Lumbar Exercises: Aerobic   Recumbent Bike  lvl 2, 7 min       Lumbar Exercises: Standing   Row  Both;15 reps    Theraband Level (Row)  Level 2 (Red)    Shoulder Extension  Both;10 reps;Theraband;Strengthening    Theraband Level (Shoulder Extension)  Level 1 (Yellow)      Lumbar Exercises: Supine   Clam  3 seconds;15 reps    Clam Limitations  red TB at knees     Bent Knee Raise  15 reps;3 seconds   Tactile cues for neutral lumbar positioning    Bent Knee Raise Limitations  red TB at knees                PT Short Term Goals - 03/12/19 0939      PT SHORT TERM GOAL #1   Title  Independent with initial HEP     Time  3    Period  Weeks    Status  On-going    Target Date  03/30/19        PT Long Term Goals - 03/12/19 0940      PT LONG TERM GOAL #1    Title  Patient to be independent with advanced HEP.    Time  6    Period  Weeks    Status  On-going      PT LONG TERM GOAL #2   Title  Patient to demonstrate B hip strength >=4+/5.    Time  6    Period  Weeks    Status  On-going      PT LONG TERM GOAL #3   Title  Patient to report 50% improvement in pain with lumbar AROM.    Time  6    Period  Weeks    Status  On-going      PT LONG TERM GOAL #4   Title  Patient to report tolerance of 45 min of standing/walking without onset of pain.    Time  6    Period  Weeks    Status  On-going            Plan - 03/23/19 0947    Clinical Impression Statement  Session focused on proximal hip strengthening and scapular strengthening activities for improved postural support.  Pt. tolerated all activities well today in session however requires frequent cueing to stay on task as she has a tendency to talk throughout session.  Notes updated HEP is going well.  Does still complain of intermittent B posterior/lateral LE pain at home radiating down to B knees however notes this primarily bothers her at home and does not limit her during PT sessions.  Ended visit pain free thus modalities deferred.    Personal Factors and Comorbidities  Age;Comorbidity 3+;Time since onset of injury/illness/exacerbation;Fitness;Past/Current Experience    Comorbidities  RA, osteoporosis, OA, neuropathy, HTN, HLD, GERD, dysrhythmia, carpal tunnel, COPD, ankylosing spondylitis, L TKA 2017, R TKA 2014, back surgery    Rehab Potential  Good    PT Treatment/Interventions  ADLs/Self Care Home Management;Cryotherapy;Electrical Stimulation;Moist Heat;Balance training;Neuromuscular re-education;Therapeutic exercise;Therapeutic activities;Functional mobility training;Stair training;Patient/family education;Manual techniques;Gait training;Ultrasound;Taping;Energy conservation;Dry needling;Passive range of motion    PT Next Visit Plan  Progress lumbopelvic strengthening; standing LE  strengthening per pt. tolerance; modalities prn    Consulted and Agree with Plan of Care  Patient  Patient will benefit from skilled therapeutic intervention in order to improve the following deficits and impairments:  Abnormal gait, Hypomobility, Decreased activity tolerance, Decreased strength, Pain, Decreased balance, Decreased mobility, Difficulty walking, Increased muscle spasms, Improper body mechanics, Decreased range of motion, Postural dysfunction, Impaired flexibility  Visit Diagnosis: 1. Chronic bilateral low back pain with bilateral sciatica   2. Abnormal posture   3. Other abnormalities of gait and mobility   4. Difficulty in walking, not elsewhere classified        Problem List Patient Active Problem List   Diagnosis Date Noted  . Spinal stenosis of lumbar region 03/04/2019  . COPD with chronic bronchitis ([facility]) 09/25/2018  . Mucopurulent chronic bronchitis ([facility]) 08/21/2018  . Cough 08/21/2018  . 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 Acute blood loss as cause of postoperative anemia 06/21/2018  . 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 Spondylosis 06/13/2018  . Status post lumbar spinal fusion 06/13/2018  . Status post lumbar spine surgery for decompression of spinal cord 06/13/2018  . 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 History of total knee arthroplasty, right 09/20/2017  . OA (osteoarthritis) of knee 12/26/2015  . Essential hypertension 11/24/2015  . Hyperlipidemia 11/24/2015  . Rheumatoid arthritis ([facility]) 11/24/2015  . Lumbar spondylosis 07/21/2014    Kermit Balo, PTA 03/23/19 12:45 PM   [facility] [address]  [address] [place], [place], [address] Phone: [phone]   Fax:  [phone]  Name: Joy [name] MRN: [id number] Date of Birth: 01/15/1941

## 2019-03-24 ENCOUNTER — Other Ambulatory Visit: Payer: Self-pay

## 2019-03-24 NOTE — Patient Outreach (Signed)
[facility]) Care Management  03/24/2019  Joy [name] 06/19/1941 [id number]   Successful follow up call to patient today.  She confirmed receipt of ALF's that was mailed, however, she reports that she has not yet made a decision about level of care.  BSW is closing case but encouraged her to call if additional needs arise.    Ronn Melena, BSW Social Worker [phone]

## 2019-03-25 ENCOUNTER — Other Ambulatory Visit: Payer: Self-pay | Admitting: Pharmacy Technician

## 2019-03-25 NOTE — Patient Outreach (Signed)
[facility]) Care Management  03/25/2019  Joy [name] [birth year] [id number]   Successful outreach call placed to patient in regards to [employer] application for Proventil HFA.  Spoke to Patient who informed this was not a good time to talk as she was on her way out the door and requested a call back later today.  Will attempt to followup with patient later today if schedule allows, otherwise will followup with patient in 1-5 business days.  [name] P. Sriya Kroeze, [place] Management [phone]

## 2019-03-25 NOTE — Patient Outreach (Signed)
[facility]) Care Management  03/25/2019  Joy [name] 1941/01/30 [id number]  ADDENDUM  Successful outreach call placed to patient, HIPAA identifiers verified.  Patient informed this was a better time to speak. Patient informed she had received the letter from [employer]. Discussed the attestation form with patient as well as the paperwork that needed to be sent along with the attestation form. Patient verbalized understanding and informed she would complete it and mail it back this week.  Will followup with [employer] in 10-14 business days to inquire if they have received the attestation form.  Chung Chagoya P. Kennedie Pardoe, [place] Management [phone]

## 2019-03-26 ENCOUNTER — Other Ambulatory Visit: Payer: Self-pay

## 2019-03-26 ENCOUNTER — Encounter: Payer: Self-pay | Admitting: Physical Therapy

## 2019-03-26 ENCOUNTER — Ambulatory Visit: Payer: Self-pay | Admitting: Pharmacist

## 2019-03-26 ENCOUNTER — Ambulatory Visit: Payer: Medicare Other | Admitting: Physical Therapy

## 2019-03-26 DIAGNOSIS — M5442 Lumbago with sciatica, left side: Secondary | ICD-10-CM | POA: Diagnosis not present

## 2019-03-26 DIAGNOSIS — M5441 Lumbago with sciatica, right side: Secondary | ICD-10-CM | POA: Diagnosis not present

## 2019-03-26 DIAGNOSIS — R2689 Other abnormalities of gait and mobility: Secondary | ICD-10-CM

## 2019-03-26 DIAGNOSIS — R293 Abnormal posture: Secondary | ICD-10-CM | POA: Diagnosis not present

## 2019-03-26 DIAGNOSIS — G8929 Other chronic pain: Secondary | ICD-10-CM | POA: Diagnosis not present

## 2019-03-26 DIAGNOSIS — R262 Difficulty in walking, not elsewhere classified: Secondary | ICD-10-CM

## 2019-03-26 NOTE — Therapy (Signed)
[facility] [address]  [address] [place], [place], [address] Phone: [phone]   Fax:  [phone]  Physical Therapy Treatment  Patient Details  Name: Joy [name] MRN: [id number] Date of Birth: May 20, 1941 Referring Provider (PT): Coletta Memos, MD   Encounter Date: 03/26/2019  PT End of Session - 03/26/19 1144    Visit Number  6    Number of Visits  13    Date for PT Re-Evaluation  04/20/19    Authorization Type  Medicare & AARP    PT Start Time  1058    PT Stop Time  1142    PT Time Calculation (min)  44 min    Activity Tolerance  Patient tolerated treatment well    Behavior During Therapy  [facility] for tasks assessed/performed       Past Medical History:  Diagnosis Date  . Ankylosing spondylitis ([facility])   . Arthritis    RHEUMATOID  . Bronchitis   . COPD (chronic obstructive pulmonary disease) ([facility])    CXR 01/11/18 showed mild COPD and chronic bronchitis  . CTS (carpal tunnel syndrome)   . Dysrhythmia    "irregularity" unknown at this time - being evaluated by cardiology  . Essential hypertension 11/24/2015  . GERD (gastroesophageal reflux disease)   . Hypercholesteremia   . Hyperlipidemia 11/24/2015  . Hypertension   . IBS (irritable bowel syndrome)   . Neuropathy   . OAB (overactive bladder)   . Osteoarthritis   . Osteoporosis   . RA (rheumatoid arthritis) ([facility])   . Rheumatoid arthritis ([facility]) 11/24/2015  . Seasonal allergies     Past Surgical History:  Procedure Laterality Date  . ABDOMINAL HYSTERECTOMY  1995  . BACK SURGERY    . BREAST SURGERY     REDUCTION  . CATARACT EXTRACTION W/PHACO  08/01/2012   Procedure: CATARACT EXTRACTION PHACO AND INTRAOCULAR LENS PLACEMENT (IOC);  Surgeon: Chalmers Guest, MD;  Location: [facility];  Service: Ophthalmology;  Laterality: Right;  . HERNIA REPAIR     RIGHT ING.  . JOINT REPLACEMENT  2014   rt total knee  . TONSILLECTOMY    . 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  Subjective Assessment - 03/26/19 1059    Subjective  Reports that she is doing well today and not really having much pain. Tried to do some chair yoga on her own this week.    Pertinent History  RA, osteoporosis, OA, neuropathy, HTN, HLD, GERD, dysrhythmia, carpal tunnel, COPD, ankylosing spondylitis, L TKA 2017, R TKA 2014, back surgery    Patient Stated Goals  "would like to be able to walk my dog"    Currently in Pain?  No/denies                       [facility] Adult PT Treatment/Exercise - 03/26/19 0001      Exercises   Exercises  Knee/Hip;Lumbar;Shoulder      Lumbar Exercises: Stretches   Piriformis Stretch  Right;Left;1 rep;30 seconds    Piriformis Stretch Limitations  sitting KTOS    Figure 4 Stretch  1 rep;30 seconds;With overpressure    Figure 4 Stretch Limitations  sitting figure 4       Lumbar Exercises: Aerobic   Nustep  Lvl 4, (UE/LE)  Lumbar Exercises: Seated   Other Seated Lumbar Exercises  forward green pball rollouts 10x3" to tolerance    Other Seated Lumbar Exercises  pelvic tilts anterior/posterior 2x10   cues to relax shoulders down     Lumbar Exercises: Supine   Bridge with clamshell  10 reps   red TB above knees   Other Supine Lumbar Exercises  resisted hip flexion with red TB x20      Knee/Hip Exercises: Standing   Hip Abduction  Stengthening;Right;Left;1 set;10 reps;Knee straight    Abduction Limitations  at treadmill; cues for upright posture   less difficulty on L   Hip Extension  Stengthening;Right;Left;1 set;15 reps;Knee straight    Extension Limitations  at treadmill      Knee/Hip Exercises: Seated   Other Seated Knee/Hip Exercises  sitting row with red TB x15   cues to sit up tall     Shoulder Exercises: Seated   External Rotation   Strengthening;Both;10 reps;Theraband    Theraband Level (Shoulder External Rotation)  Level 1 (Yellow)    External Rotation Limitations  B ER    cues to maintain upright sitting posture     Shoulder Exercises: Standing   Horizontal ABduction  Strengthening;Both;10 reps;Theraband    Theraband Level (Shoulder Horizontal ABduction)  Level 1 (Yellow)    Horizontal ABduction Limitations  2x10; standing at doorframe for upright posture    External Rotation  Strengthening;Both;10 reps;Theraband    Theraband Level (Shoulder External Rotation)  Level 1 (Yellow)    External Rotation Limitations  standing at doorframe for upright posture               PT Short Term Goals - 03/12/19 0939      PT SHORT TERM GOAL #1   Title  Independent with initial HEP     Time  3    Period  Weeks    Status  On-going    Target Date  03/30/19        PT Long Term Goals - 03/12/19 0940      PT LONG TERM GOAL #1   Title  Patient to be independent with advanced HEP.    Time  6    Period  Weeks    Status  On-going      PT LONG TERM GOAL #2   Title  Patient to demonstrate B hip strength >=4+/5.    Time  6    Period  Weeks    Status  On-going      PT LONG TERM GOAL #3   Title  Patient to report 50% improvement in pain with lumbar AROM.    Time  6    Period  Weeks    Status  On-going      PT LONG TERM GOAL #4   Title  Patient to report tolerance of 45 min of standing/walking without onset of pain.    Time  6    Period  Weeks    Status  On-going            Plan - 03/26/19 1144    Clinical Impression Statement  Patient arrived to session with report of no pain in LB, noting that this is unusual for her. Worked on gentle lumbopelvic ROM and hip stretching with good tolerance. Introduced physioball rollouts for posterior chain stretching within limited ROM but without pain. Continued to work on promoting upright posture with periscapular strengthening. Patient requiring cues to remember  avoiding anterior trunk lean with standing hip  strengthening today. Better able to perform hip abduction on L vs. R. Ended session with report of "my back is tired" but no pain. Declined modalities at end of session.    Comorbidities  RA, osteoporosis, OA, neuropathy, HTN, HLD, GERD, dysrhythmia, carpal tunnel, COPD, ankylosing spondylitis, L TKA 2017, R TKA 2014, back surgery    Rehab Potential  Good    PT Treatment/Interventions  ADLs/Self Care Home Management;Cryotherapy;Electrical Stimulation;Moist Heat;Balance training;Neuromuscular re-education;Therapeutic exercise;Therapeutic activities;Functional mobility training;Stair training;Patient/family education;Manual techniques;Gait training;Ultrasound;Taping;Energy conservation;Dry needling;Passive range of motion    PT Next Visit Plan  Progress lumbopelvic strengthening; standing LE strengthening per pt. tolerance; modalities prn    Consulted and Agree with Plan of Care  Patient       Patient will benefit from skilled therapeutic intervention in order to improve the following deficits and impairments:  Abnormal gait, Hypomobility, Decreased activity tolerance, Decreased strength, Pain, Decreased balance, Decreased mobility, Difficulty walking, Increased muscle spasms, Improper body mechanics, Decreased range of motion, Postural dysfunction, Impaired flexibility  Visit Diagnosis: 1. Chronic bilateral low back pain with bilateral sciatica   2. Abnormal posture   3. Other abnormalities of gait and mobility   4. Difficulty in walking, not elsewhere classified        Problem List Patient Active Problem List   Diagnosis Date Noted  . Spinal stenosis of lumbar region 03/04/2019  . COPD with chronic bronchitis (HCC) 09/25/2018  . Mucopurulent chronic bronchitis (HCC) 08/21/2018  . Cough 08/21/2018  . 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 Acute blood loss as cause of postoperative anemia 06/21/2018  .  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 Spondylosis 06/13/2018  . Status post lumbar spinal fusion 06/13/2018  . Status post lumbar spine surgery for decompression of spinal cord 06/13/2018  . 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 History of total knee arthroplasty, right 09/20/2017  . OA (osteoarthritis) of knee 12/26/2015  . Essential hypertension 11/24/2015  . Hyperlipidemia 11/24/2015  . Rheumatoid arthritis (HCC) 11/24/2015  . Lumbar spondylosis 07/21/2014    Anette Guarneri, PT, DPT 03/26/19 11:48 AM   [facility] [address]  [address] [place], [place], [address] Phone: [phone]   Fax:  [phone]  Name: Joy [name] MRN: [id number] Date of Birth: 07-23-1941

## 2019-03-27 ENCOUNTER — Encounter: Payer: Medicare Other | Admitting: Physical Therapy

## 2019-03-27 DIAGNOSIS — M48062 Spinal stenosis, lumbar region with neurogenic claudication: Secondary | ICD-10-CM | POA: Diagnosis not present

## 2019-03-30 ENCOUNTER — Ambulatory Visit: Payer: Medicare Other

## 2019-03-30 ENCOUNTER — Other Ambulatory Visit: Payer: Self-pay

## 2019-03-30 DIAGNOSIS — M5441 Lumbago with sciatica, right side: Secondary | ICD-10-CM

## 2019-03-30 DIAGNOSIS — R262 Difficulty in walking, not elsewhere classified: Secondary | ICD-10-CM

## 2019-03-30 DIAGNOSIS — R2689 Other abnormalities of gait and mobility: Secondary | ICD-10-CM

## 2019-03-30 DIAGNOSIS — R293 Abnormal posture: Secondary | ICD-10-CM | POA: Diagnosis not present

## 2019-03-30 DIAGNOSIS — G8929 Other chronic pain: Secondary | ICD-10-CM

## 2019-03-30 DIAGNOSIS — M5442 Lumbago with sciatica, left side: Secondary | ICD-10-CM | POA: Diagnosis not present

## 2019-03-30 NOTE — Therapy (Signed)
[facility] [address]  [address] [place], [place], [address] Phone: [phone]   Fax:  [phone]  Physical Therapy Treatment  Patient Details  Name: Joy [name] MRN: [id number] Date of Birth: 1941/07/19 Referring Provider (PT): Coletta MemosKyle Cabbell, MD   Encounter Date: 03/30/2019  PT End of Session - 03/30/19 0954    Visit Number  7    Number of Visits  13    Date for PT Re-Evaluation  04/20/19    Authorization Type  Medicare & AARP    PT Start Time  0930    PT Stop Time  1015    PT Time Calculation (min)  45 min    Activity Tolerance  Patient tolerated treatment well    Behavior During Therapy  [facility] for tasks assessed/performed       Past Medical History:  Diagnosis Date  . Ankylosing spondylitis (HCC)   . Arthritis    RHEUMATOID  . Bronchitis   . COPD (chronic obstructive pulmonary disease) (HCC)    CXR 01/11/18 showed mild COPD and chronic bronchitis  . CTS (carpal tunnel syndrome)   . Dysrhythmia    "irregularity" unknown at this time - being evaluated by cardiology  . Essential hypertension 11/24/2015  . GERD (gastroesophageal reflux disease)   . Hypercholesteremia   . Hyperlipidemia 11/24/2015  . Hypertension   . IBS (irritable bowel syndrome)   . Neuropathy   . OAB (overactive bladder)   . Osteoarthritis   . Osteoporosis   . RA (rheumatoid arthritis) (HCC)   . Rheumatoid arthritis (HCC) 11/24/2015  . Seasonal allergies     Past Surgical History:  Procedure Laterality Date  . ABDOMINAL HYSTERECTOMY  1995  . BACK SURGERY    . BREAST SURGERY     REDUCTION  . CATARACT EXTRACTION W/PHACO  08/01/2012   Procedure: CATARACT EXTRACTION PHACO AND INTRAOCULAR LENS PLACEMENT (IOC);  Surgeon: Chalmers Guestoy Whitaker, MD;  [facility];  Service: Ophthalmology;  Laterality: Right;  . HERNIA REPAIR     RIGHT ING.  . JOINT REPLACEMENT  2014   rt total knee  . TONSILLECTOMY    . TOTAL KNEE ARTHROPLASTY Left 12/26/2015   Procedure: TOTAL KNEE ARTHROPLASTY;  Surgeon: Ollen GrossFrank Aluisio, MD;  Location: [facility];  Service: Orthopedics;  Laterality: Left;    There were no vitals filed for this visit.  Subjective Assessment - 03/30/19 0950    Subjective  Pt. noting she had injection in back on friday.  has noted improved pain levels since Friday.    Pertinent History  RA, osteoporosis, OA, neuropathy, HTN, HLD, GERD, dysrhythmia, carpal tunnel, COPD, ankylosing spondylitis, L TKA 2017, R TKA 2014, back surgery    Patient Stated Goals  "would like to be able to walk my dog"    Currently in Pain?  No/denies    Pain Score  0-No pain   LBP up to 7/10 while "overdoing it" with yardwork on Sunday   Pain Location  Back    Pain Orientation  Lower    Pain Descriptors / Indicators  Sharp    Pain Type  Chronic pain    Multiple Pain Sites  No                       [facility] Adult PT Treatment/Exercise - 03/30/19 0001      Lumbar Exercises: Stretches   Passive Hamstring Stretch  Right;Left;1 rep;30 seconds    Passive Hamstring Stretch Limitations  seated on chair     Hip Flexor Stretch  Right;Left;1 rep;30 seconds    Hip Flexor Stretch Limitations  seated on chair     Piriformis Stretch  Right;Left;1 rep;30 seconds    Piriformis Stretch Limitations  seated modified glute stretch on mat table       Lumbar Exercises: Aerobic   Recumbent Bike  lvl 2, 6 min       Lumbar Exercises: Standing   Heel Raises  15 reps;3 seconds    Heel Raises Limitations  at chair       Lumbar Exercises: Seated   Sit to Stand  10 reps    Sit to Stand Limitations  no UE pushoff - cues for slow eccentric lowering       Knee/Hip Exercises: Standing   Hip Flexion  Right;Left;10 reps;Knee bent;Stengthening    Hip Flexion Limitations  Cues provided for LE height and abdom. bracing     Hip Abduction  Left;10 reps;Knee straight;Right;Stengthening    Abduction Limitations  at chair     Hip Extension  Left;10 reps;Knee  straight;Right;Stengthening    Extension Limitations  at chair              PT Education - 03/30/19 1055    Education provided  Yes    Education Details  HEP update: calf raise, three dir hip kicker at chair    Person(s) Educated  Patient    Methods  Explanation;Demonstration;Verbal cues;Handout    Comprehension  Verbalized understanding;Returned demonstration;Verbal cues required       PT Short Term Goals - 03/30/19 1023      PT SHORT TERM GOAL #1   Title  Independent with initial HEP     Time  3    Period  Weeks    Status  Achieved    Target Date  03/30/19        PT Long Term Goals - 03/12/19 0940      PT LONG TERM GOAL #1   Title  Patient to be independent with advanced HEP.    Time  6    Period  Weeks    Status  On-going      PT LONG TERM GOAL #2   Title  Patient to demonstrate B hip strength >=4+/5.    Time  6    Period  Weeks    Status  On-going      PT LONG TERM GOAL #3   Title  Patient to report 50% improvement in pain with lumbar AROM.    Time  6    Period  Weeks    Status  On-going      PT LONG TERM GOAL #4   Title  Patient to report tolerance of 45 min of standing/walking without onset of pain.    Time  6    Period  Weeks    Status  On-going            Plan - 03/30/19 1002    Clinical Impression Statement  Joy [name] doing well today noting some improvement in back pain since injection on Friday.  Session focused on review of chair yoga-type exercises as pt. noting she has been trying to do some chair yoga activities at home and this is her preferred form of activity.  Advanced proximal hip strengthening activities with chair support in standing today with pt. tolerated all activities well and ended session pain free.  HEP updated.  Will monitor tolerance in coming session.  Pt.  progressing well toward goals.    Personal Factors and Comorbidities  Age;Comorbidity 3+;Time since onset of injury/illness/exacerbation;Fitness;Past/Current  Experience    Rehab Potential  Good    PT Treatment/Interventions  ADLs/Self Care Home Management;Cryotherapy;Electrical Stimulation;Moist Heat;Balance training;Neuromuscular re-education;Therapeutic exercise;Therapeutic activities;Functional mobility training;Stair training;Patient/family education;Manual techniques;Gait training;Ultrasound;Taping;Energy conservation;Dry needling;Passive range of motion    PT Next Visit Plan  Progress lumbopelvic strengthening; standing LE strengthening per pt. tolerance; modalities prn    Consulted and Agree with Plan of Care  Patient       Patient will benefit from skilled therapeutic intervention in order to improve the following deficits and impairments:  Abnormal gait, Hypomobility, Decreased activity tolerance, Decreased strength, Pain, Decreased balance, Decreased mobility, Difficulty walking, Increased muscle spasms, Improper body mechanics, Decreased range of motion, Postural dysfunction, Impaired flexibility  Visit Diagnosis: 1. Chronic bilateral low back pain with bilateral sciatica   2. Abnormal posture   3. Other abnormalities of gait and mobility   4. Difficulty in walking, not elsewhere classified        Problem List Patient Active Problem List   Diagnosis Date Noted  . Spinal stenosis of lumbar region 03/04/2019  . COPD with chronic bronchitis ([place]) 09/25/2018  . 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 Cough 08/21/2018  . 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 Acute blood loss as cause of postoperative anemia 06/21/2018  . 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 Spondylosis 06/13/2018  . Status post lumbar spinal fusion 06/13/2018  . Status post lumbar spine surgery for decompression of spinal cord 06/13/2018  . 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 History of total knee arthroplasty, right 09/20/2017  . OA (osteoarthritis) of knee 12/26/2015  . Essential hypertension  11/24/2015  . Hyperlipidemia 11/24/2015  . Rheumatoid arthritis ([place]) 11/24/2015  . Lumbar spondylosis 07/21/2014    Bess Harvest, PTA 03/30/19 12:20 PM     [facility] [place] [address]  [place], [place], [address] Phone: [phone]   Fax:  [phone]  Name: LEMON WHITACRE MRN: [id number] Date of Birth: 22-Jun-1941

## 2019-04-02 ENCOUNTER — Other Ambulatory Visit: Payer: Self-pay | Admitting: *Deleted

## 2019-04-02 ENCOUNTER — Encounter: Payer: Self-pay | Admitting: *Deleted

## 2019-04-02 NOTE — Patient Outreach (Signed)
[facility]) Care Management  04/02/2019  MITZI LILJA [birth year] [id number]   RN Health Coach Monthly Outreach  Referral Date:  02/05/2019 Referral Source:  Primary MD Reason for Referral:  Medication Assistance Insurance:  Medicare   Outreach Attempt:  Successful telephone outreach to patient for follow up.  HIPAA verified with patient.  Patient stating she is doing well.  Received injection in back last week and has been attending outpatient physical therapy.  Does endorse an occasional cough with some phlegm.  Denies any shortness of breath or fever.  Patient stating she feels it is her allergies and the heat as she has been outside.  Encouraged patient to contact provider if cough or symptoms get worse.    Appointments:  Attended appointment with primary care provider, Dr. Drema Dallas on 01/20/2019.  Patient has seen Dr. Loanne Drilling, Pulmonary on 09/22/2018 and Dr. Oval Linsey with Cardiology on 11/27/2018.  Plan: RN Health Coach will make next telephone outreach to patient within the month of August.  Nelwyn Hebdon RN [place] [phone] [email or web address]

## 2019-04-03 ENCOUNTER — Encounter: Payer: Self-pay | Admitting: Physical Therapy

## 2019-04-03 ENCOUNTER — Ambulatory Visit: Payer: Medicare Other | Admitting: Physical Therapy

## 2019-04-03 ENCOUNTER — Other Ambulatory Visit: Payer: Self-pay

## 2019-04-03 DIAGNOSIS — R262 Difficulty in walking, not elsewhere classified: Secondary | ICD-10-CM | POA: Diagnosis not present

## 2019-04-03 DIAGNOSIS — R2689 Other abnormalities of gait and mobility: Secondary | ICD-10-CM

## 2019-04-03 DIAGNOSIS — M5441 Lumbago with sciatica, right side: Secondary | ICD-10-CM | POA: Diagnosis not present

## 2019-04-03 DIAGNOSIS — R293 Abnormal posture: Secondary | ICD-10-CM | POA: Diagnosis not present

## 2019-04-03 DIAGNOSIS — M5442 Lumbago with sciatica, left side: Secondary | ICD-10-CM | POA: Diagnosis not present

## 2019-04-03 DIAGNOSIS — G8929 Other chronic pain: Secondary | ICD-10-CM | POA: Diagnosis not present

## 2019-04-03 NOTE — Therapy (Signed)
[facility] [address]  [address] [place], [place], [address] Phone: [phone]   Fax:  [phone]  Physical Therapy Treatment  Patient Details  Name: Joy [name] MRN: [id number] Date of Birth: 10/02/1940 Referring Provider (PT): Coletta Memos, MD   Encounter Date: 04/03/2019  PT End of Session - 04/03/19 1202    Visit Number  8    Number of Visits  13    Date for PT Re-Evaluation  04/20/19    Authorization Type  Medicare & AARP    PT Start Time  [phone]    PT Stop Time  1016    PT Time Calculation (min)  45 min    Activity Tolerance  Patient tolerated treatment well    Behavior During Therapy  [facility] for tasks assessed/performed       Past Medical History:  Diagnosis Date  . Ankylosing spondylitis (HCC)   . Arthritis    RHEUMATOID  . Bronchitis   . COPD (chronic obstructive pulmonary disease) (HCC)    CXR 01/11/18 showed mild COPD and chronic bronchitis  . CTS (carpal tunnel syndrome)   . Dysrhythmia    "irregularity" unknown at this time - being evaluated by cardiology  . Essential hypertension 11/24/2015  . GERD (gastroesophageal reflux disease)   . Hypercholesteremia   . Hyperlipidemia 11/24/2015  . Hypertension   . IBS (irritable bowel syndrome)   . Neuropathy   . OAB (overactive bladder)   . Osteoarthritis   . Osteoporosis   . RA (rheumatoid arthritis) (HCC)   . Rheumatoid arthritis (HCC) 11/24/2015  . Seasonal allergies     Past Surgical History:  Procedure Laterality Date  . ABDOMINAL HYSTERECTOMY  1995  . BACK SURGERY    . BREAST SURGERY     REDUCTION  . CATARACT EXTRACTION W/PHACO  08/01/2012   Procedure: CATARACT EXTRACTION PHACO AND INTRAOCULAR LENS PLACEMENT (IOC);  Surgeon: Chalmers Guest, MD;  Location: [facility] OR;  Service: Ophthalmology;  Laterality: Right;  . HERNIA REPAIR     RIGHT ING.  . JOINT REPLACEMENT  2014   rt total knee  . TONSILLECTOMY    . 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  Subjective Assessment - 04/03/19 0932    Subjective  She has been achey all over. Has had new onset of R lateral and medial foot pain causing patient to feel like she is going to fall. Denies actual falls but balance has been off for 2-3 weeks.    Pertinent History  RA, osteoporosis, OA, neuropathy, HTN, HLD, GERD, dysrhythmia, carpal tunnel, COPD, ankylosing spondylitis, L TKA 2017, R TKA 2014, back surgery    Patient Stated Goals  "would like to be able to walk my dog"    Currently in Pain?  Yes    Pain Score  2     Pain Location  Shoulder   UT   Pain Orientation  Left    Pain Descriptors / Indicators  Sore    Pain Type  Acute pain                       [facility] Adult PT Treatment/Exercise - 04/03/19 0001      Lumbar Exercises: Stretches   Piriformis Stretch  Right;Left;1 rep;30 seconds    Piriformis Stretch  Limitations  sitting KTOS    Figure 4 Stretch  1 rep;30 seconds;With overpressure    Figure 4 Stretch Limitations  sitting figure 4       Lumbar Exercises: Aerobic   Nustep  Lvl 4, (UE/LE)      Lumbar Exercises: Seated   Long Arc Quad on Chair  Strengthening;Right;Left;1 set;10 reps    LAQ on Chair Limitations  sitting on green pball   more difficulty on R   Other Seated Lumbar Exercises  sitting on green pball marching 2x20   intermittent min A for balance recovery   Other Seated Lumbar Exercises  sitting on green pball pelvic tilts x10   manual assistance     Knee/Hip Exercises: Standing   Other Standing Knee Exercises  sidestepping with yelw TB around ankles 4x109ft with HHA   cues for form     Shoulder Exercises: Seated   Row  Strengthening;Both;10 reps;Theraband    Theraband Level (Shoulder Row)  Level 2 (Red)    Row Limitations  2x10; sitting on green pball    good form    Other Seated Exercises  sitting UT stretch 30" each LE               PT Short Term Goals - 03/30/19 1023      PT SHORT TERM GOAL #1   Title  Independent with initial HEP     Time  3    Period  Weeks    Status  Achieved    Target Date  03/30/19        PT Long Term Goals - 03/12/19 0940      PT LONG TERM GOAL #1   Title  Patient to be independent with advanced HEP.    Time  6    Period  Weeks    Status  On-going      PT LONG TERM GOAL #2   Title  Patient to demonstrate B hip strength >=4+/5.    Time  6    Period  Weeks    Status  On-going      PT LONG TERM GOAL #3   Title  Patient to report 50% improvement in pain with lumbar AROM.    Time  6    Period  Weeks    Status  On-going      PT LONG TERM GOAL #4   Title  Patient to report tolerance of 45 min of standing/walking without onset of pain.    Time  6    Period  Weeks    Status  On-going            Plan - 04/03/19 1202    Clinical Impression Statement  Patient arrived to session with report of recent onset of R foot pain causing her to feel like she is going to fall.  Denies any actual falls. Patient thinking about contacting her neurosurgeon- encouraged patient to do so. Introduced core stability exercises on physioball. Patient with tendency to sit with posterior pelvic tilt, causing imbalance. Demonstrating improvement in stability and overall performance with cues to maintain trunk upright and anterior pelvic tilt. Introduced sidestepping with banded resistance and HHA to assist with balance. Patient reporting "I always feel better after I workout" at end of session.    Personal Factors and Comorbidities  Age;Comorbidity 3+;Time since onset of injury/illness/exacerbation;Fitness;Past/Current Experience    Comorbidities  RA, osteoporosis, OA, neuropathy, HTN, HLD, GERD, dysrhythmia, carpal tunnel, COPD, ankylosing spondylitis, L TKA 2017, R  TKA 2014, back surgery    Rehab Potential  Good    PT  Treatment/Interventions  ADLs/Self Care Home Management;Cryotherapy;Electrical Stimulation;Moist Heat;Balance training;Neuromuscular re-education;Therapeutic exercise;Therapeutic activities;Functional mobility training;Stair training;Patient/family education;Manual techniques;Gait training;Ultrasound;Taping;Energy conservation;Dry needling;Passive range of motion    PT Next Visit Plan  Progress lumbopelvic strengthening; standing LE strengthening per pt. tolerance; modalities prn    Consulted and Agree with Plan of Care  Patient       Patient will benefit from skilled therapeutic intervention in order to improve the following deficits and impairments:  Abnormal gait, Hypomobility, Decreased activity tolerance, Decreased strength, Pain, Decreased balance, Decreased mobility, Difficulty walking, Increased muscle spasms, Improper body mechanics, Decreased range of motion, Postural dysfunction, Impaired flexibility  Visit Diagnosis: 1. Chronic bilateral low back pain with bilateral sciatica   2. Abnormal posture   3. Other abnormalities of gait and mobility   4. Difficulty in walking, not elsewhere classified        Problem List Patient Active Problem List   Diagnosis Date Noted  . Spinal stenosis of lumbar region 03/04/2019  . COPD with chronic bronchitis ([place]) 09/25/2018  . 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 Cough 08/21/2018  . 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 Acute blood loss as cause of postoperative anemia 06/21/2018  . 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 Spondylosis 06/13/2018  . Status post lumbar spinal fusion 06/13/2018  . Status post lumbar spine surgery for decompression of spinal cord 06/13/2018  . 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 History of total knee arthroplasty, right 09/20/2017  . OA (osteoarthritis) of knee 12/26/2015  . Essential hypertension 11/24/2015  . Hyperlipidemia 11/24/2015  .  Rheumatoid arthritis ([place]) 11/24/2015  . Lumbar spondylosis 07/21/2014     Janene Harvey, PT, DPT 04/03/19 12:05 PM   [facility] [place] [facility] [address]  [place], [place], [address] Phone: [phone]   Fax:  [phone]  Name: Joy [name] MRN: [id number] Date of Birth: 1941-05-20

## 2019-04-06 ENCOUNTER — Other Ambulatory Visit: Payer: Self-pay

## 2019-04-06 ENCOUNTER — Ambulatory Visit: Payer: Medicare Other

## 2019-04-06 DIAGNOSIS — R293 Abnormal posture: Secondary | ICD-10-CM

## 2019-04-06 DIAGNOSIS — R262 Difficulty in walking, not elsewhere classified: Secondary | ICD-10-CM

## 2019-04-06 DIAGNOSIS — M5441 Lumbago with sciatica, right side: Secondary | ICD-10-CM

## 2019-04-06 DIAGNOSIS — G8929 Other chronic pain: Secondary | ICD-10-CM | POA: Diagnosis not present

## 2019-04-06 DIAGNOSIS — R2689 Other abnormalities of gait and mobility: Secondary | ICD-10-CM

## 2019-04-06 DIAGNOSIS — M5442 Lumbago with sciatica, left side: Secondary | ICD-10-CM | POA: Diagnosis not present

## 2019-04-06 NOTE — Therapy (Signed)
[facility] [address]  [place], [place], [address] Phone: [phone]   Fax:  [phone]  Physical Therapy Treatment  Patient Details  Name: Joy [name] MRN: [id number] Date of Birth: [birth year] Referring Provider (PT): Ashok Pall, MD   Encounter Date: 04/06/2019  PT End of Session - 04/06/19 0940    Visit Number  9    Number of Visits  13    Date for PT Re-Evaluation  04/20/19    Authorization Type  Medicare & AARP    PT Start Time  0935    PT Stop Time  1015    PT Time Calculation (min)  40 min    Activity Tolerance  Patient tolerated treatment well    Behavior During Therapy  [facility] for tasks assessed/performed       Past Medical History:  Diagnosis Date  . Ankylosing spondylitis ([place])   . Arthritis    RHEUMATOID  . Bronchitis   . COPD (chronic obstructive pulmonary disease) ([place])    CXR 01/11/18 showed mild COPD and chronic bronchitis  . CTS (carpal tunnel syndrome)   . Dysrhythmia    "irregularity" unknown at this time - being evaluated by cardiology  . Essential hypertension 11/24/2015  . GERD (gastroesophageal reflux disease)   . Hypercholesteremia   . Hyperlipidemia 11/24/2015  . Hypertension   . IBS (irritable bowel syndrome)   . Neuropathy   . OAB (overactive bladder)   . Osteoarthritis   . Osteoporosis   . RA (rheumatoid arthritis) ([place])   . Rheumatoid arthritis ([place]) 11/24/2015  . Seasonal allergies     Past Surgical History:  Procedure Laterality Date  . ABDOMINAL HYSTERECTOMY  1995  . BACK SURGERY    . BREAST SURGERY     REDUCTION  . CATARACT EXTRACTION W/PHACO  08/01/2012   Procedure: CATARACT EXTRACTION PHACO AND INTRAOCULAR LENS PLACEMENT (IOC);  Surgeon: Marylynn Pearson, MD;  Location: [facility];  Service: Ophthalmology;  Laterality: Right;  . HERNIA REPAIR     RIGHT ING.  . JOINT REPLACEMENT  2014   rt total knee  . TONSILLECTOMY    . 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  Subjective Assessment - 04/06/19 0940    Subjective  Pt. reporting improvement in foot pain and has not felt need to speak with Dr. Saintclair Halsted regarding this.    Pertinent History  RA, osteoporosis, OA, neuropathy, HTN, HLD, GERD, dysrhythmia, carpal tunnel, COPD, ankylosing spondylitis, L TKA 2017, R TKA 2014, back surgery    Patient Stated Goals  "would like to be able to walk my dog"    Currently in Pain?  Yes    Pain Score  3     Pain Location  Knee    Pain Orientation  Right;Left    Pain Descriptors / Indicators  Sore    Pain Type  Acute pain    Multiple Pain Sites  No         [facility] PT Assessment - 04/06/19 0001      AROM   AROM Assessment Site  Lumbar    Lumbar Flexion  toes    Lumbar Extension  moderately limited   discomfort    Lumbar - Right Side Bend  jt line    Lumbar - Left Side Bend  jt Line     Lumbar - Right Rotation  mildly limited    Lumbar - Left Rotation  mildly limited                   [facility] Adult PT Treatment/Exercise - 04/06/19 0001      Lumbar Exercises: Stretches   Single Knee to Chest Stretch  Right;Left;30 seconds;1 rep    Lower Trunk Rotation  --   5" x 10 reps      Lumbar Exercises: Aerobic   Recumbent Bike  lvl 1, 6 min       Lumbar Exercises: Standing   Other Standing Lumbar Exercises  standing lumbar extension to tolerance at counter 3" x 10 reps       Lumbar Exercises: Sidelying   Other Sidelying Lumbar Exercises  "Open book" stretch B 3"x 10 reps       Knee/Hip Exercises: Standing   Hip Flexion  Right;Left;10 reps;Knee straight;Stengthening    Hip Flexion Limitations  yellow TB at ankles     Hip Abduction  Right;Left;10 reps    Abduction Limitations  yellow TB at ankles     Hip Extension  Right;Left;Knee straight    Extension Limitations  yellow TB at  ankle       Knee/Hip Exercises: Seated   Sit to Sand  15 reps;without UE support               PT Short Term Goals - 03/30/19 1023      PT SHORT TERM GOAL #1   Title  Independent with initial HEP     Time  3    Period  Weeks    Status  Achieved    Target Date  03/30/19        PT Long Term Goals - 04/06/19 0956      PT LONG TERM GOAL #1   Title  Patient to be independent with advanced HEP.    Time  6    Period  Weeks    Status  On-going      PT LONG TERM GOAL #2   Title  Patient to demonstrate B hip strength >=4+/5.    Time  6    Period  Weeks    Status  On-going      PT LONG TERM GOAL #3   Title  Patient to report 50% improvement in pain with lumbar AROM.    Time  6    Period  Weeks    Status  Partially Met      PT LONG TERM GOAL #4   Title  Patient to report tolerance of 45 min of standing/walking without onset of pain.    Time  6    Period  Weeks    Status  Partially Met            Plan - 04/06/19 0941    Clinical Impression Statement  Pt. reporting she has not been feeling foot pain over last few days and has not felt need to contact MD regarding foot pain since last session.  Is concerned over her balance as she notes she stumbles at times at home however able to self-correct with UE support without fall.  Does plan to contact MD regarding this.  Pt. noting she was able to walk/stand with dog outdoors 35 min over weekend without onset of back pain partially achieving LTG #4.  Demonstrating improved lumbar AROM today partially achieving LTG #3.  Tolerated all lumbar ROM  and lumbopelvic strengthening activities with progression of standing 3-way hip kicker without issue.  Issued pt. yellow looped TB for 3-way hip kicker progression at home with pt. verbalizing understanding.  Progressing well toward goals.    Personal Factors and Comorbidities  Age;Comorbidity 3+;Time since onset of injury/illness/exacerbation;Fitness;Past/Current Experience     Comorbidities  RA, osteoporosis, OA, neuropathy, HTN, HLD, GERD, dysrhythmia, carpal tunnel, COPD, ankylosing spondylitis, L TKA 2017, R TKA 2014, back surgery    Rehab Potential  Good    PT Treatment/Interventions  ADLs/Self Care Home Management;Cryotherapy;Electrical Stimulation;Moist Heat;Balance training;Neuromuscular re-education;Therapeutic exercise;Therapeutic activities;Functional mobility training;Stair training;Patient/family education;Manual techniques;Gait training;Ultrasound;Taping;Energy conservation;Dry needling;Passive range of motion    PT Next Visit Plan  Progress lumbopelvic strengthening; standing LE strengthening per pt. tolerance; modalities prn    Consulted and Agree with Plan of Care  Patient       Patient will benefit from skilled therapeutic intervention in order to improve the following deficits and impairments:  Abnormal gait, Hypomobility, Decreased activity tolerance, Decreased strength, Pain, Decreased balance, Decreased mobility, Difficulty walking, Increased muscle spasms, Improper body mechanics, Decreased range of motion, Postural dysfunction, Impaired flexibility  Visit Diagnosis: 1. Chronic bilateral low back pain with bilateral sciatica   2. Abnormal posture   3. Other abnormalities of gait and mobility   4. Difficulty in walking, not elsewhere classified        Problem List Patient Active Problem List   Diagnosis Date Noted  . Spinal stenosis of lumbar region 03/04/2019  . COPD with chronic bronchitis ([place]) 09/25/2018  . Mucopurulent chronic bronchitis ([place]) 08/21/2018  . Cough 08/21/2018  . 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 Acute blood loss as cause of postoperative anemia 06/21/2018  . 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 Spondylosis 06/13/2018  . Status post lumbar spinal fusion 06/13/2018  . Status post lumbar spine surgery for decompression of spinal cord 06/13/2018  . History of total knee replacement,  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  . History of total knee arthroplasty, right 09/20/2017  . OA (osteoarthritis) of knee 12/26/2015  . Essential hypertension 11/24/2015  . Hyperlipidemia 11/24/2015  . Rheumatoid arthritis ([place]) 11/24/2015  . Lumbar spondylosis 07/21/2014    Bess Harvest, PTA 04/06/19 12:57 PM    [facility] [address]  [place], [place], [address] Phone: [phone]   Fax:  [phone]  Name: LEYTON BROWNLEE MRN: [id number] Date of Birth: 07/18/1941

## 2019-04-08 ENCOUNTER — Other Ambulatory Visit: Payer: Self-pay | Admitting: Pharmacy Technician

## 2019-04-08 NOTE — Patient Outreach (Signed)
[facility]) Care Management  04/08/2019  MACEL YEARSLEY 26-Jul-1941 [id number]  Care coordination call placed to [employer] in regards to patient's application for Proventil.  Spoke to [place] who informed patient had been APPROVED 04/02/2019-08/20/2019. She informed it can take up to 3 business days for pharmacy to  receive and process the request and another 7-10 business days for it to arrive to patient's home.  Will followup with patient in 15-20 business days to inquire if medication was received.  Iviana Blasingame P. Yehya Brendle, [place] Management [phone]

## 2019-04-09 ENCOUNTER — Other Ambulatory Visit: Payer: Self-pay | Admitting: Pharmacy Technician

## 2019-04-09 NOTE — Patient Outreach (Signed)
[place]) Care Management  04/09/2019  MARDY HOPPE [birth year] [id number]    Incoming call received from patient returning my call from 04/08/2019 in regards to [facility] application for Sprivia and [employer] application for Proventil.  Spoke to patient, HIPAA identifiers verified.  Inquire if patient had received her EOB. Patient informed she has received it and has not met the required OOP for [employer] of $600 in order to apply for the Incruse. The Incruse was going to take the place of the Spiriva with BI because patient was denied with BI for being over income. However, since patient has not met the $34 OOP then the appeals process for BI would be denied as well since patient has to spend 3% of income on Spiriva Respimat to be approved. Patient was informed to watch her EOB and OOP spend and if she met the $600 OOP requirement to let us know and we would mail her a [place] application. Patient verbalized understanding.  Informed patient that she had been approved for Proventil HFA with [employer]. Informed patient the medication would arrive in the mail within the next month. Patient verbalized understanding.  Will followup with patient in 15-20 business days to inquire if Proventil HFA was received.  Keaundra Stehle P. Shelli Portilla, [place] Management [phone]

## 2019-04-10 ENCOUNTER — Ambulatory Visit: Payer: Medicare Other | Admitting: Physical Therapy

## 2019-04-10 ENCOUNTER — Encounter: Payer: Self-pay | Admitting: Physical Therapy

## 2019-04-10 ENCOUNTER — Other Ambulatory Visit: Payer: Self-pay

## 2019-04-10 DIAGNOSIS — R293 Abnormal posture: Secondary | ICD-10-CM

## 2019-04-10 DIAGNOSIS — G8929 Other chronic pain: Secondary | ICD-10-CM | POA: Diagnosis not present

## 2019-04-10 DIAGNOSIS — R2689 Other abnormalities of gait and mobility: Secondary | ICD-10-CM

## 2019-04-10 DIAGNOSIS — R262 Difficulty in walking, not elsewhere classified: Secondary | ICD-10-CM | POA: Diagnosis not present

## 2019-04-10 DIAGNOSIS — M5442 Lumbago with sciatica, left side: Secondary | ICD-10-CM | POA: Diagnosis not present

## 2019-04-10 DIAGNOSIS — M5441 Lumbago with sciatica, right side: Secondary | ICD-10-CM | POA: Diagnosis not present

## 2019-04-10 NOTE — Therapy (Signed)
[facility] [address]  [place], [place], [address] Phone: [phone]   Fax:  [phone]  Physical Therapy Progress Note  Patient Details  Name: Joy [name] MRN: [id number] Date of Birth: 11-30-1940 Referring Provider (PT): Ashok Pall, MD   Progress Note Reporting Period 03/09/19 to 04/10/19  See note below for Objective Data and Assessment of Progress/Goals.     Encounter Date: 04/10/2019  PT End of Session - 04/10/19 1205    Visit Number  10    Number of Visits  13    Date for PT Re-Evaluation  04/20/19    Authorization Type  Medicare & AARP    PT Start Time  [phone]    PT Stop Time  1013    PT Time Calculation (min)  39 min    Activity Tolerance  Patient tolerated treatment well    Behavior During Therapy  [facility] for tasks assessed/performed       Past Medical History:  Diagnosis Date  . Ankylosing spondylitis ([place])   . Arthritis    RHEUMATOID  . Bronchitis   . COPD (chronic obstructive pulmonary disease) ([place])    CXR 01/11/18 showed mild COPD and chronic bronchitis  . CTS (carpal tunnel syndrome)   . Dysrhythmia    "irregularity" unknown at this time - being evaluated by cardiology  . Essential hypertension 11/24/2015  . GERD (gastroesophageal reflux disease)   . Hypercholesteremia   . Hyperlipidemia 11/24/2015  . Hypertension   . IBS (irritable bowel syndrome)   . Neuropathy   . OAB (overactive bladder)   . Osteoarthritis   . Osteoporosis   . RA (rheumatoid arthritis) ([place])   . Rheumatoid arthritis ([place]) 11/24/2015  . Seasonal allergies     Past Surgical History:  Procedure Laterality Date  . ABDOMINAL HYSTERECTOMY  1995  . BACK SURGERY    . BREAST SURGERY     REDUCTION  . CATARACT EXTRACTION W/PHACO  08/01/2012   Procedure: CATARACT EXTRACTION PHACO AND INTRAOCULAR LENS PLACEMENT (IOC);  Surgeon: Marylynn Pearson, MD;  Location: [facility];  Service: Ophthalmology;  Laterality: Right;  .  HERNIA REPAIR     RIGHT ING.  . JOINT REPLACEMENT  2014   rt total knee  . TONSILLECTOMY    . 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  Subjective Assessment - 04/10/19 0933    Subjective  Reports that she is tired today. Feels like it is d/t the weather. Reports that between PT and the injection she recently had, she has had 60-70% improvement. Now able to walk her dog for 4 blocks without increase in pain. However, was not able to walk this AM d/t pain.    Pertinent History  RA, osteoporosis, OA, neuropathy, HTN, HLD, GERD, dysrhythmia, carpal tunnel, COPD, ankylosing spondylitis, L TKA 2017, R TKA 2014, back surgery    Patient Stated Goals  "would like to be able to walk my dog"    Currently in Pain?  Yes    Pain Score  7     Pain Location  Back    Pain Orientation  Right;Left    Pain Descriptors / Indicators  Sore;Aching    Pain Type  Chronic pain         [facility] PT Assessment - 04/10/19 0001  Observation/Other Assessments   Focus on Therapeutic Outcomes (FOTO)   Lumbar: 61 (39% limited, 45% predicted)      Strength   Right Hip Flexion  4/5    Right Hip ABduction  4+/5    Right Hip ADduction  4/5    Left Hip Flexion  4/5    Left Hip ABduction  4+/5    Left Hip ADduction  4/5                   [facility] Adult PT Treatment/Exercise - 04/10/19 0001      Lumbar Exercises: Aerobic   Nustep  Lvl 4, 26mn (UE/LE)      Knee/Hip Exercises: Seated   Ball Squeeze  10x10"     Marching  Strengthening;Right;Left;1 set;10 reps    Marching Limitations  with red TB around toes      Knee/Hip Exercises: Sidelying   Hip ABduction  Strengthening;Right;Left;1 set;15 reps    Hip ABduction Limitations  good alignment after cues    Hip ADduction  Strengthening;Right;Left;2 sets;10 reps    Hip ADduction  Limitations  opposite LE on bolster   better on L LE            PT Education - 04/10/19 1205    Education provided  Yes    Education Details  update to HEP; discussion on objective progress with PT thus far    Person(s) Educated  Patient    Methods  Explanation;Demonstration;Tactile cues;Verbal cues;Handout    Comprehension  Verbalized understanding;Returned demonstration       PT Short Term Goals - 04/10/19 0949      PT SHORT TERM GOAL #1   Title  Independent with initial HEP     Time  3    Period  Weeks    Status  Achieved    Target Date  03/30/19        PT Long Term Goals - 04/10/19 0949      PT LONG TERM GOAL #1   Title  Patient to be independent with advanced HEP.    Time  6    Period  Weeks    Status  Partially Met   met for current     PT LONG TERM GOAL #2   Title  Patient to demonstrate B hip strength >=4+/5.    Time  6    Period  Weeks    Status  Partially Met   B hip abduction strength improved, still limited in hip flexion and adduction     PT LONG TERM GOAL #3   Title  Patient to report 50% improvement in pain with lumbar AROM.    Time  6    Period  Weeks    Status  Partially Met   reports 40% improvement     PT LONG TERM GOAL #4   Title  Patient to report tolerance of 45 min of standing/walking without onset of pain.    Time  6    Period  Weeks    Status  Partially Met   reports 20 min of walking at this time           Plan - 04/10/19 1209    Clinical Impression Statement  Patient arrived to session with report of increased pain in LB recently; attributes this to the weather. Overall, reports 60-70% improvement since starting PT. Notes that on average, she is able to walk her dog further than before. Strength tested revealed improvement in B hip abduction  strength, still limited in hip flexion and adduction. Patient reports 40% improvement in pain levels alone. Now notes that she is able to tolerate about 20 minutes on her feet  without an increase in pain. Worked on LE strengthening exercises to address remaining hip weakness. Patient tolerated these well and with no c/o pain. Updated HEP with exercises that were well-tolerated today. Patient reported improvement and with no complaints at end of session. Patient is showing good strength and functional activity tolerance improvements. Will benefit from continued skilled PT services to address remaining goals.    Comorbidities  RA, osteoporosis, OA, neuropathy, HTN, HLD, GERD, dysrhythmia, carpal tunnel, COPD, ankylosing spondylitis, L TKA 2017, R TKA 2014, back surgery    Rehab Potential  Good    PT Treatment/Interventions  ADLs/Self Care Home Management;Cryotherapy;Electrical Stimulation;Moist Heat;Balance training;Neuromuscular re-education;Therapeutic exercise;Therapeutic activities;Functional mobility training;Stair training;Patient/family education;Manual techniques;Gait training;Ultrasound;Taping;Energy conservation;Dry needling;Passive range of motion    PT Next Visit Plan  Progress lumbopelvic strengthening; standing LE strengthening per pt. tolerance; modalities prn    Consulted and Agree with Plan of Care  Patient       Patient will benefit from skilled therapeutic intervention in order to improve the following deficits and impairments:  Abnormal gait, Hypomobility, Decreased activity tolerance, Decreased strength, Pain, Decreased balance, Decreased mobility, Difficulty walking, Increased muscle spasms, Improper body mechanics, Decreased range of motion, Postural dysfunction, Impaired flexibility  Visit Diagnosis: Chronic bilateral low back pain with bilateral sciatica  Abnormal posture  Other abnormalities of gait and mobility  Difficulty in walking, not elsewhere classified     Problem List Patient Active Problem List   Diagnosis Date Noted  . Spinal stenosis of lumbar region 03/04/2019  . COPD with chronic bronchitis ([place]) 09/25/2018  . Mucopurulent  chronic bronchitis ([place]) 08/21/2018  . Cough 08/21/2018  . 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 Acute blood loss as cause of postoperative anemia 06/21/2018  . 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 Spondylosis 06/13/2018  . Status post lumbar spinal fusion 06/13/2018  . Status post lumbar spine surgery for decompression of spinal cord 06/13/2018  . 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 History of total knee arthroplasty, right 09/20/2017  . OA (osteoarthritis) of knee 12/26/2015  . Essential hypertension 11/24/2015  . Hyperlipidemia 11/24/2015  . Rheumatoid arthritis ([place]) 11/24/2015  . Lumbar spondylosis 07/21/2014     Janene Harvey, PT, DPT 04/10/19 12:12 PM   [facility] [address]  [place], [place], [address] Phone: [phone]   Fax:  [phone]  Name: Joy [name] MRN: [id number] Date of Birth: 03-Dec-1940

## 2019-04-13 ENCOUNTER — Ambulatory Visit: Payer: Medicare Other

## 2019-04-13 ENCOUNTER — Other Ambulatory Visit: Payer: Self-pay

## 2019-04-13 DIAGNOSIS — R2689 Other abnormalities of gait and mobility: Secondary | ICD-10-CM | POA: Diagnosis not present

## 2019-04-13 DIAGNOSIS — G8929 Other chronic pain: Secondary | ICD-10-CM

## 2019-04-13 DIAGNOSIS — R293 Abnormal posture: Secondary | ICD-10-CM | POA: Diagnosis not present

## 2019-04-13 DIAGNOSIS — M5442 Lumbago with sciatica, left side: Secondary | ICD-10-CM | POA: Diagnosis not present

## 2019-04-13 DIAGNOSIS — R262 Difficulty in walking, not elsewhere classified: Secondary | ICD-10-CM

## 2019-04-13 DIAGNOSIS — M5441 Lumbago with sciatica, right side: Secondary | ICD-10-CM | POA: Diagnosis not present

## 2019-04-13 NOTE — Therapy (Signed)
[facility] [address]  [place], [place], [address] Phone: [phone]   Fax:  [phone]  Physical Therapy Treatment  Patient Details  Name: Joy [name] MRN: [id number] Date of Birth: [birth year] Referring Provider (PT): Ashok Pall, MD   Encounter Date: 04/13/2019  PT End of Session - 04/13/19 1026    Visit Number  11    Number of Visits  13    Date for PT Re-Evaluation  04/20/19    Authorization Type  Medicare & AARP    PT Start Time  1016    PT Stop Time  1056    PT Time Calculation (min)  40 min    Activity Tolerance  Patient tolerated treatment well    Behavior During Therapy  [facility] for tasks assessed/performed       Past Medical History:  Diagnosis Date  . Ankylosing spondylitis ([place])   . Arthritis    RHEUMATOID  . Bronchitis   . COPD (chronic obstructive pulmonary disease) ([place])    CXR 01/11/18 showed mild COPD and chronic bronchitis  . CTS (carpal tunnel syndrome)   . Dysrhythmia    "irregularity" unknown at this time - being evaluated by cardiology  . Essential hypertension 11/24/2015  . GERD (gastroesophageal reflux disease)   . Hypercholesteremia   . Hyperlipidemia 11/24/2015  . Hypertension   . IBS (irritable bowel syndrome)   . Neuropathy   . OAB (overactive bladder)   . Osteoarthritis   . Osteoporosis   . RA (rheumatoid arthritis) ([place])   . Rheumatoid arthritis ([place]) 11/24/2015  . Seasonal allergies     Past Surgical History:  Procedure Laterality Date  . ABDOMINAL HYSTERECTOMY  1995  . BACK SURGERY    . BREAST SURGERY     REDUCTION  . CATARACT EXTRACTION W/PHACO  08/01/2012   Procedure: CATARACT EXTRACTION PHACO AND INTRAOCULAR LENS PLACEMENT (IOC);  Surgeon: Marylynn Pearson, MD;  Location: [facility];  Service: Ophthalmology;  Laterality: Right;  . HERNIA REPAIR     RIGHT ING.  . JOINT REPLACEMENT  2014   rt total knee  . TONSILLECTOMY    . TOTAL KNEE ARTHROPLASTY Left 12/26/2015    Procedure: TOTAL KNEE ARTHROPLASTY;  Surgeon: Gaynelle Arabian, MD;  [facility];  Service: Orthopedics;  Laterality: Left;    There were no vitals filed for this visit.  Subjective Assessment - 04/13/19 1023    Subjective  Pt. doing well today.    Pertinent History  RA, osteoporosis, OA, neuropathy, HTN, HLD, GERD, dysrhythmia, carpal tunnel, COPD, ankylosing spondylitis, L TKA 2017, R TKA 2014, back surgery    Patient Stated Goals  "would like to be able to walk my dog"    Currently in Pain?  Yes    Pain Score  0-No pain   back pain up to 3/10 pain over weekend   Pain Location  Back    Pain Orientation  Right;Left    Pain Descriptors / Indicators  Sore;Aching    Pain Type  Chronic pain    Pain Score  0   5-6/10 pain in mornings   Pain Location  Knee    Pain Orientation  Right;Left    Pain Descriptors / Indicators  Aching    Pain Type  Chronic pain                       [facility] Adult PT Treatment/Exercise - 04/13/19 0001  Self-Care   Self-Care  Other Self-Care Comments    Other Self-Care Comments   discussion with pt. regarding getting in touch with [employer] regarding future chair yoga and water aerobics as she was unsure if local [employer] has opened back up for limited attendance classes; dicussion regarding need for ongoing fitness program and focusing on activities pt. enjoys       Lumbar Exercises: Aerobic   Nustep  Lvl 4, 97mn (UE/LE)      Lumbar Exercises: Standing   Heel Raises  20 reps;3 seconds    Row  Both;15 reps   Cues to avoid excessive scap. elevation    Theraband Level (Row)  Level 2 (Red)    Shoulder Extension  Both;Strengthening;15 reps;Theraband    Theraband Level (Shoulder Extension)  Level 1 (Yellow)    Shoulder Extension Limitations  Cues for scap. retraction required     Other Standing Lumbar Exercises  3-way hip kicker yellow looped TB at ankles x 10 reps; 1 ski poles               PT Short Term Goals - 04/10/19 0949      PT  SHORT TERM GOAL #1   Title  Independent with initial HEP     Time  3    Period  Weeks    Status  Achieved    Target Date  03/30/19        PT Long Term Goals - 04/10/19 0949      PT LONG TERM GOAL #1   Title  Patient to be independent with advanced HEP.    Time  6    Period  Weeks    Status  Partially Met   met for current     PT LONG TERM GOAL #2   Title  Patient to demonstrate B hip strength >=4+/5.    Time  6    Period  Weeks    Status  Partially Met   B hip abduction strength improved, still limited in hip flexion and adduction     PT LONG TERM GOAL #3   Title  Patient to report 50% improvement in pain with lumbar AROM.    Time  6    Period  Weeks    Status  Partially Met   reports 40% improvement     PT LONG TERM GOAL #4   Title  Patient to report tolerance of 45 min of standing/walking without onset of pain.    Time  6    Period  Weeks    Status  Partially Met   reports 20 min of walking at this time           Plan - 04/13/19 1057    Clinical Impression Statement  Pt. doing well today with no new complaints.  Session focused on progression of scapular strengthening and lumbopelvic strengthening activities in standing for improved core control with functional activities.  Pt. pain free throughout standing therex and ended session with discussion of possible chair yoga/water aerobics classes at local [employer].  Pt. encouraged to pursue options for ongoing community fitness which match her needs.  Pt. reporting plans to contact local [employer] over next few days.    Personal Factors and Comorbidities  Age;Comorbidity 3+;Time since onset of injury/illness/exacerbation;Fitness;Past/Current Experience    Comorbidities  RA, osteoporosis, OA, neuropathy, HTN, HLD, GERD, dysrhythmia, carpal tunnel, COPD, ankylosing spondylitis, L TKA 2017, R TKA 2014, back surgery    Rehab Potential  Good  PT Treatment/Interventions  ADLs/Self Care Home Management;Cryotherapy;Electrical  Stimulation;Moist Heat;Balance training;Neuromuscular re-education;Therapeutic exercise;Therapeutic activities;Functional mobility training;Stair training;Patient/family education;Manual techniques;Gait training;Ultrasound;Taping;Energy conservation;Dry needling;Passive range of motion    PT Next Visit Plan  Progress lumbopelvic strengthening; standing LE strengthening per pt. tolerance; modalities prn    Consulted and Agree with Plan of Care  Patient       Patient will benefit from skilled therapeutic intervention in order to improve the following deficits and impairments:  Abnormal gait, Hypomobility, Decreased activity tolerance, Decreased strength, Pain, Decreased balance, Decreased mobility, Difficulty walking, Increased muscle spasms, Improper body mechanics, Decreased range of motion, Postural dysfunction, Impaired flexibility  Visit Diagnosis: Chronic bilateral low back pain with bilateral sciatica  Abnormal posture  Other abnormalities of gait and mobility  Difficulty in walking, not elsewhere classified     Problem List Patient Active Problem List   Diagnosis Date Noted  . Spinal stenosis of lumbar region 03/04/2019  . COPD with chronic bronchitis ([place]) 09/25/2018  . Mucopurulent chronic bronchitis ([place]) 08/21/2018  . Cough 08/21/2018  . 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 Acute blood loss as cause of postoperative anemia 06/21/2018  . 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 Spondylosis 06/13/2018  . Status post lumbar spinal fusion 06/13/2018  . Status post lumbar spine surgery for decompression of spinal cord 06/13/2018  . 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 History of total knee arthroplasty, right 09/20/2017  . OA (osteoarthritis) of knee 12/26/2015  . Essential hypertension 11/24/2015  . Hyperlipidemia 11/24/2015  . Rheumatoid arthritis ([place]) 11/24/2015  . Lumbar spondylosis 07/21/2014    Bess Harvest,  PTA 04/13/19 5:31 PM   [facility] [address]  [place], [place], [address] Phone: [phone]   Fax:  [phone]  Name: Joy [name] MRN: [id number] Date of Birth: 10-Jan-1941

## 2019-04-14 DIAGNOSIS — M0589 Other rheumatoid arthritis with rheumatoid factor of multiple sites: Secondary | ICD-10-CM | POA: Diagnosis not present

## 2019-04-16 ENCOUNTER — Other Ambulatory Visit: Payer: Self-pay

## 2019-04-16 ENCOUNTER — Encounter: Payer: Self-pay | Admitting: Physical Therapy

## 2019-04-16 ENCOUNTER — Ambulatory Visit: Payer: Medicare Other | Admitting: Physical Therapy

## 2019-04-16 DIAGNOSIS — R2689 Other abnormalities of gait and mobility: Secondary | ICD-10-CM | POA: Diagnosis not present

## 2019-04-16 DIAGNOSIS — R262 Difficulty in walking, not elsewhere classified: Secondary | ICD-10-CM | POA: Diagnosis not present

## 2019-04-16 DIAGNOSIS — R293 Abnormal posture: Secondary | ICD-10-CM | POA: Diagnosis not present

## 2019-04-16 DIAGNOSIS — M5442 Lumbago with sciatica, left side: Secondary | ICD-10-CM | POA: Diagnosis not present

## 2019-04-16 DIAGNOSIS — M5441 Lumbago with sciatica, right side: Secondary | ICD-10-CM | POA: Diagnosis not present

## 2019-04-16 DIAGNOSIS — G8929 Other chronic pain: Secondary | ICD-10-CM

## 2019-04-16 NOTE — Therapy (Addendum)
[facility] [address]  [place], [place], [address] Phone: [phone]   Fax:  [phone]  Physical Therapy Treatment  Patient Details  Name: Joy [name] MRN: [id number] Date of Birth: 1941/04/09 Referring Provider (PT): Ashok Pall, MD   Progress Note Reporting Period 04/13/19 to 04/16/19  See note below for Objective Data and Assessment of Progress/Goals.    Encounter Date: 04/16/2019  PT End of Session - 04/16/19 1052    Visit Number  12    Number of Visits  13    Date for PT Re-Evaluation  04/20/19    Authorization Type  Medicare & AARP    PT Start Time  3710    PT Stop Time  1050    PT Time Calculation (min)  35 min    Activity Tolerance  Patient tolerated treatment well    Behavior During Therapy  WFL for tasks assessed/performed       Past Medical History:  Diagnosis Date  . Ankylosing spondylitis ([place])   . Arthritis    RHEUMATOID  . Bronchitis   . COPD (chronic obstructive pulmonary disease) ([place])    CXR 01/11/18 showed mild COPD and chronic bronchitis  . CTS (carpal tunnel syndrome)   . Dysrhythmia    "irregularity" unknown at this time - being evaluated by cardiology  . Essential hypertension 11/24/2015  . GERD (gastroesophageal reflux disease)   . Hypercholesteremia   . Hyperlipidemia 11/24/2015  . Hypertension   . IBS (irritable bowel syndrome)   . Neuropathy   . OAB (overactive bladder)   . Osteoarthritis   . Osteoporosis   . RA (rheumatoid arthritis) ([place])   . Rheumatoid arthritis ([place]) 11/24/2015  . Seasonal allergies     Past Surgical History:  Procedure Laterality Date  . ABDOMINAL HYSTERECTOMY  1995  . BACK SURGERY    . BREAST SURGERY     REDUCTION  . CATARACT EXTRACTION W/PHACO  08/01/2012   Procedure: CATARACT EXTRACTION PHACO AND INTRAOCULAR LENS PLACEMENT (IOC);  Surgeon: Marylynn Pearson, MD;  Location: [facility];  Service: Ophthalmology;  Laterality: Right;  . HERNIA  REPAIR     RIGHT ING.  . JOINT REPLACEMENT  2014   rt total knee  . TONSILLECTOMY    . 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  Subjective Assessment - 04/16/19 1014    Subjective  Reports that she would like to wrap up with PT today and has signed up for outside silver sneakers classes at the [facility]. Reports that she has noticed a 30-35% improvement, with the injection that she received also providing pain relief.Now able to walking longer and better with her dog.    Pertinent History  RA, osteoporosis, OA, neuropathy, HTN, HLD, GERD, dysrhythmia, carpal tunnel, COPD, ankylosing spondylitis, L TKA 2017, R TKA 2014, back surgery    Patient Stated Goals  "would like to be able to walk my dog"    Currently in Pain?  No/denies         [facility] PT Assessment - 04/16/19 0001      Observation/Other Assessments   Focus on Therapeutic Outcomes (FOTO)   Lumbar: 53 (47% limited, 45% predicted)      AROM   AROM Assessment Site  Lumbar    Lumbar Flexion  toes  Lumbar Extension  mildly limited    Lumbar - Right Side Bend  jt line   mild pain   Lumbar - Left Side Bend  jt lime   mild pain   Lumbar - Right Rotation  WFL    Lumbar - Left Rotation  WFL      Strength   Right Hip Flexion  4+/5    Right Hip ABduction  4+/5    Right Hip ADduction  4+/5    Left Hip Flexion  4+/5    Left Hip ABduction  4+/5    Left Hip ADduction  4/5                   [facility] Adult PT Treatment/Exercise - 04/16/19 0001      Lumbar Exercises: Stretches   Other Lumbar Stretch Exercise  lumbar flexion rollout to tolerance 5x5"      Lumbar Exercises: Aerobic   Recumbent Bike  lvl 1, 6 min       Lumbar Exercises: Seated   Long Arc Quad on Chair  Strengthening;Right;Left;1 set;10 reps    LAQ on Chair Limitations  sitting on green  pball   intermittent CGA   Hip Flexion on Ball  Strengthening;Right;Left;20 reps    Hip Flexion on Ball Limitations  alt marching x20 on green pball    Other Seated Lumbar Exercises  sitting on green pball pelvic tilts x10             PT Education - 04/16/19 1052    Education provided  Yes    Education Details  update to HEP; discussion on objective progress with PT    Person(s) Educated  Patient    Methods  Explanation;Demonstration;Tactile cues;Verbal cues;Handout    Comprehension  Verbalized understanding;Returned demonstration       PT Short Term Goals - 04/16/19 1021      PT SHORT TERM GOAL #1   Title  Independent with initial HEP     Time  3    Period  Weeks    Status  Achieved    Target Date  03/30/19        PT Long Term Goals - 04/16/19 1021      PT LONG TERM GOAL #1   Title  Patient to be independent with advanced HEP.    Time  6    Period  Weeks    Status  Achieved      PT LONG TERM GOAL #2   Title  Patient to demonstrate B hip strength >=4+/5.    Time  6    Period  Weeks    Status  Partially Met   L hip adduction strength still 4/5     PT LONG TERM GOAL #3   Title  Patient to report 50% improvement in pain with lumbar AROM.    Time  6    Period  Weeks    Status  Achieved   only with mild pain remaining with R/L sidebending, good ROM and pain improvements demonstrated     PT LONG TERM GOAL #4   Title  Patient to report tolerance of 45 min of standing/walking without onset of pain.    Time  6    Period  Weeks    Status  Partially Met   reports 15-20 min of walking at this time           Plan - 04/16/19 1146    Clinical Impression Statement  Patient arrived to session  with report of 30-35% improvement in LBP since initial eval. Notes that she is able to "walk better and walk my dog longer." Has signed up for a silver sneakers class at the [facility] in order to continue her fitness regimen. Patient reports that she is able to tolerate 15-20  min of continuous walking before onset of LBP. Strength testing revealed improvements in B hip flexion and R hip adduction strength, with only L hip adductor weakness remaining. Now only with mild pain remaining with R/L sidebending AROM. Has demonstrated good ROM and pain improvements with lumbar AROM. Practiced and educated patient on core strengthening exercises using physioball as patient has one at home. Advised patient to perform these in front of a couch and with her daughter supervising for safety. Patient reported understanding and with no complaints at end of session. Patient has met or partially met all goals, now placing patient on 30 day hold d/t satisfaction with CLOF.    Comorbidities  RA, osteoporosis, OA, neuropathy, HTN, HLD, GERD, dysrhythmia, carpal tunnel, COPD, ankylosing spondylitis, L TKA 2017, R TKA 2014, back surgery    PT Treatment/Interventions  ADLs/Self Care Home Management;Cryotherapy;Electrical Stimulation;Moist Heat;Balance training;Neuromuscular re-education;Therapeutic exercise;Therapeutic activities;Functional mobility training;Stair training;Patient/family education;Manual techniques;Gait training;Ultrasound;Taping;Energy conservation;Dry needling;Passive range of motion    PT Next Visit Plan  30 day hold at this time    Consulted and Agree with Plan of Care  Patient       Patient will benefit from skilled therapeutic intervention in order to improve the following deficits and impairments:  Abnormal gait, Hypomobility, Decreased activity tolerance, Decreased strength, Pain, Decreased balance, Decreased mobility, Difficulty walking, Increased muscle spasms, Improper body mechanics, Decreased range of motion, Postural dysfunction, Impaired flexibility  Visit Diagnosis: Chronic bilateral low back pain with bilateral sciatica  Abnormal posture  Other abnormalities of gait and mobility  Difficulty in walking, not elsewhere classified     Problem List Patient  Active Problem List   Diagnosis Date Noted  . Spinal stenosis of lumbar region 03/04/2019  . COPD with chronic bronchitis ([place]) 09/25/2018  . Mucopurulent chronic bronchitis ([place]) 08/21/2018  . Cough 08/21/2018  . 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 Acute blood loss as cause of postoperative anemia 06/21/2018  . 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 Spondylosis 06/13/2018  . Status post lumbar spinal fusion 06/13/2018  . Status post lumbar spine surgery for decompression of spinal cord 06/13/2018  . 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 History of total knee arthroplasty, right 09/20/2017  . OA (osteoarthritis) of knee 12/26/2015  . Essential hypertension 11/24/2015  . Hyperlipidemia 11/24/2015  . Rheumatoid arthritis ([place]) 11/24/2015  . Lumbar spondylosis 07/21/2014     Janene Harvey, PT, DPT 04/16/19 11:49 AM   [facility] [address]  [place], [place], [address] Phone: [phone]   Fax:  [phone]  Name: Joy [name] MRN: [id number] Date of Birth: [birth year]  PHYSICAL THERAPY DISCHARGE SUMMARY  Visits from Start of Care: 12  Current functional level related to goals / functional outcomes: See above clinical impression; patient did not return after being placed on 30 day hold   Remaining deficits: Decreased hip strength and walking tolerance   Education / Equipment: HEP  Plan: Patient agrees to discharge.  Patient goals were partially met. Patient is being discharged due to being pleased with the current functional level.  ?????     Janene Harvey, PT, DPT 05/21/19 3:19 PM

## 2019-04-21 ENCOUNTER — Ambulatory Visit: Payer: Self-pay | Admitting: Pharmacist

## 2019-04-21 DIAGNOSIS — Z23 Encounter for immunization: Secondary | ICD-10-CM | POA: Diagnosis not present

## 2019-04-22 ENCOUNTER — Other Ambulatory Visit: Payer: Self-pay | Admitting: Pharmacy Technician

## 2019-04-22 NOTE — Patient Outreach (Signed)
[facility] ([facility]) Care Management  04/22/2019  Joy [name] 04/05/1941 [id number]   Successful outgoing call placed to patient in regards to [employer] application for Proventil HFA.  Spoke to patient, HIPAA identifiers verified.  Patient informed she received 3 inhalers of Proventil. Briefly discussed refill process but patient informed she does not use Proventil often and already had some on hand so would probably not need a refill.  Patient inquired about the Spiriva.She informed  she guess she would have to purchase some or keep asking for samples. Informed patient that she was denied for the Sprivia with BI due to being over income. [facility] RPh had mentioned to her about [employer] product Incruse but patient informed she had not met the $600 OOP requirement. Patient informed she had a provider visit coming up and would discuss alternatives at that appointment.  Will route note to [facility] RPh Katina Boyd that patient assistance has been completed and will remove myself from care team.   P. , CPhT [facility] Care Management [phone]  

## 2019-04-30 DIAGNOSIS — M0589 Other rheumatoid arthritis with rheumatoid factor of multiple sites: Secondary | ICD-10-CM | POA: Diagnosis not present

## 2019-04-30 DIAGNOSIS — Z79899 Other long term (current) drug therapy: Secondary | ICD-10-CM | POA: Diagnosis not present

## 2019-04-30 DIAGNOSIS — M150 Primary generalized (osteo)arthritis: Secondary | ICD-10-CM | POA: Diagnosis not present

## 2019-04-30 DIAGNOSIS — M2550 Pain in unspecified joint: Secondary | ICD-10-CM | POA: Diagnosis not present

## 2019-04-30 DIAGNOSIS — Z1589 Genetic susceptibility to other disease: Secondary | ICD-10-CM | POA: Diagnosis not present

## 2019-05-11 DIAGNOSIS — R7303 Prediabetes: Secondary | ICD-10-CM | POA: Diagnosis not present

## 2019-05-11 DIAGNOSIS — E7800 Pure hypercholesterolemia, unspecified: Secondary | ICD-10-CM | POA: Diagnosis not present

## 2019-05-27 DIAGNOSIS — M0589 Other rheumatoid arthritis with rheumatoid factor of multiple sites: Secondary | ICD-10-CM | POA: Diagnosis not present

## 2019-06-01 ENCOUNTER — Ambulatory Visit: Payer: Self-pay | Admitting: Pharmacist

## 2019-06-02 ENCOUNTER — Other Ambulatory Visit: Payer: Self-pay | Admitting: *Deleted

## 2019-06-02 ENCOUNTER — Encounter: Payer: Self-pay | Admitting: *Deleted

## 2019-06-02 NOTE — Patient Outreach (Signed)
[facility]) Care Management  [facility] Care Manager  06/02/2019   Joy [name] 1940/10/25 [id number]   [place] Quarterly Outreach   Referral Date:  02/05/2019 Referral Source:  Primary MD Reason for Referral:  Medication Assistance Insurance:  Medicare    Outreach Attempt:  Successful telephone outreach to patient for quarterly follow up.  HIPAA verified with patient.  Patient stating she is doing well, except some periods of sciatica pain.  Reports a lot of stiffness in the mornings that ease as the day goes.  Has completed outpatient therapy and is able to continue with yoga for exercise.  Denies any recent sick days and reports using her rescue inhaler only about twice in the past 2 months.  Denies any recent falls.  Encounter Medications:  Outpatient Encounter Medications as of 06/02/2019  Medication Sig  . albuterol (VENTOLIN HFA) 108 (90 Base) MCG/ACT inhaler Inhale 2 puffs into the lungs every 6 (six) hours as needed for wheezing or shortness of breath.  Marland Kitchen atorvastatin (LIPITOR) 20 MG tablet Take 1 tablet (20 mg total) by mouth daily.  . Biotin 1000 MCG tablet Take 1 tablet (1 mg total) by mouth daily.  . Cholecalciferol (VITAMIN D3) 25 MCG (1000 UT) CAPS Take 1 capsule (1,000 Units total) by mouth daily.  . folic acid (FOLVITE) 1 MG tablet Take 1 tablet (1 mg total) by mouth daily.  Marland Kitchen gabapentin (NEURONTIN) 300 MG capsule Take 300 mg by mouth at bedtime.   . inFLIXimab in sodium chloride 0.9 % Inject into the vein. Pt receives this infusion every 6 weeks  . irbesartan (AVAPRO) 300 MG tablet Take 1 tablet (300 mg total) by mouth daily.  . Magnesium 250 MG TABS Take 1 tablet (250 mg total) by mouth daily.  . Methotrexate Sodium (METHOTREXATE, PF,) 50 MG/2ML injection INJECT 1 ML ONCE WEEKLY. DISCARD VIAL AFTER USE  . pantoprazole (PROTONIX) 40 MG tablet Take 1 tablet (40 mg total) by mouth daily.  Vladimir Faster Glycol-Propyl Glycol (SYSTANE) 0.4-0.3 %  SOLN Apply 1 drop to eye 3 (three) times daily.  . sertraline (ZOLOFT) 25 MG tablet Take 25 mg by mouth daily.  . Tiotropium Bromide Monohydrate (SPIRIVA RESPIMAT) 2.5 MCG/ACT AERS Inhale 2 puffs into the lungs daily.  Marland Kitchen VITAMIN A PO Take by mouth daily.   . vitamin B-12 (CYANOCOBALAMIN) 1000 MCG tablet Take 1 tablet (1,000 mcg total) by mouth daily.   No facility-administered encounter medications on file as of 06/02/2019.     Functional Status:  In your present state of health, do you have any difficulty performing the following activities: 03/04/2019 06/04/2018  Hearing? N N  Vision? Y N  Comment recent cataract surgery, near vision is bad -  Difficulty concentrating or making decisions? Y N  Comment forgetfull at times -  Walking or climbing stairs? N Y  Comment takes time pt gets SOB  Dressing or bathing? N N  Doing errands, shopping? N N  Preparing Food and eating ? N -  Using the Toilet? N -  Do you have problems with loss of bowel control? N -  Managing your Medications? N -  Managing your Finances? N -  Housekeeping or managing your Housekeeping? N -  Some recent data might be hidden    Fall/Depression Screening: Fall Risk  06/02/2019 03/04/2019  Falls in the past year? 0 0  Risk for fall due to : Medication side effect;Impaired balance/gait;Impaired mobility Medication side effect;Impaired mobility  Follow up Falls  evaluation completed;Education provided;Falls prevention discussed Falls evaluation completed;Education provided;Falls prevention discussed   PHQ 2/9 Scores 03/04/2019 02/05/2019  PHQ - 2 Score 0 1   [facility] CM Care Plan Problem One     Most Recent Value  Care Plan Problem One  Knowledge deficiet related to diagnosis of COPD and self care management.  Role Documenting the Problem One  [place] for Problem One  Active  [facility] Long Term Goal   Patient will report no emergency room visits or hospitalizations within the next 90 days.  [facility] Long Term  Goal Start Date  06/02/19  [facility] Long Term Goal Met Date  06/02/19  Interventions for Problem One Long Term Goal  Discussed and reviewed care plan and goals with patient, encouraged to schedule appointments with primary care and pulmonologist, reviewed medications and encouraged medication compliance, encouraged patient to discuss maintainence inhaler with pulmonologist for possible cheaper substitute, reviewed signs and symptoms of COPD exacerbation, reviewed COPD Action plan and zones, encouraged patient to continue with increasing activity as tolerated and participating in yoga     Appointments: Reports last attended appointment with primary care provider, Dr. Drema Dallas on 01/20/2019 and states she needs to schedule follow up appointment.  Also states she needs to schedule pulmonary follow up.  Encouraged patient to schedule both appointments.  Plan: RN Health Coach will send primary care provider quarterly update. RN Health Coach will make next telephone outreach to patient within the month of January.  [place] [phone] [name]

## 2019-06-12 NOTE — Telephone Encounter (Signed)
Patient sent email   "I was prescribed Spiriva Respimat for COPD. In that I had trouble meeting the cost, you supplied me with 2 samples. My doctor connected me with the [facility] to see if they could help me with obtaining the medication. They could not, based on my income but referred the situation to [employer] sent [place], which is Albuteral. Bottom line - I still don't have the actual COPD prescribed meds. Is there a generic for Spiriva yet? If not, could you notify my pharmacy ([facility] in [place]) permission to refill. Actually, I'm feeling really good, just some seasonal allergies but none of the original symptoms. "  Dr. Loanne Drilling please advise

## 2019-06-15 NOTE — Telephone Encounter (Signed)
See previous message. Pt needs appt scheduled.   Wyn Quaker FNP

## 2019-06-15 NOTE — Telephone Encounter (Signed)
Please schedule patient with me or Wyn Quaker, NP depending on schedule availability. Patient having difficulty with COPD meds.la

## 2019-06-15 NOTE — Telephone Encounter (Signed)
06/15/2019 2126  Agree with stated recommendation by Dr. Loanne Drilling.   To summarize from what I remember regarding this case.  Patient was assisted by [facility] to apply for patient assistance programs.  Unfortunately patient did not qualify for assistance through BI for Spiriva Respimat (over income per documentation).  Patient did happen to qualify through the program for [employer] for rescue inhaler.  Patient has not met the requirements for the $600 out-of-pocket for [employer] for you application.  This was further mentioned on 03/18/2019 on a telephone note from our office.  Patient has a high deductible plan.  This makes it difficult to afford any inhalers out-of-pocket.  Almost all of them will be 200 - 300 dollars each for a month supply.  Although not ideal patient could be maintained on Spiriva Respimat 1.25 samples to the beginning of next year.  It also would be beneficial for the patient to choose her Medicare plan next year carefully and pay specific attention to her pharmaceutical plan as if it will have a high deductible as it has this year she will likely be in a very similar situation next year as well.  Patient would benefit from office visit with Dr. Loanne Drilling to further evaluate inhaler needs as well as inhaler selection.  If we have samples of Bevespi patient could be trialed on this.  Patient also could apply for [place] for me assistance.  I do not see any documentation that she is used this inhaler before.  Patient also could benefit from clinical pharmacy appointment with our pharmacy team to be counseled on [facility] and Medicare selection for next year.  Wyn Quaker FNP

## 2019-06-16 ENCOUNTER — Other Ambulatory Visit: Payer: Self-pay

## 2019-06-16 ENCOUNTER — Ambulatory Visit (INDEPENDENT_AMBULATORY_CARE_PROVIDER_SITE_OTHER): Payer: Medicare Other | Admitting: Pulmonary Disease

## 2019-06-16 ENCOUNTER — Encounter: Payer: Self-pay | Admitting: Pulmonary Disease

## 2019-06-16 VITALS — BP 132/80 | HR 70 | Temp 97.3°F | Ht 62.0 in | Wt 197.8 lb

## 2019-06-16 DIAGNOSIS — J411 Mucopurulent chronic bronchitis: Secondary | ICD-10-CM | POA: Diagnosis not present

## 2019-06-16 NOTE — Telephone Encounter (Signed)
Spoke with pt on the phone. She has been scheduled to see Aaron Edelman today at 1100. Nothing further was needed.

## 2019-06-16 NOTE — Progress Notes (Signed)
Synopsis: Referred in 05/2018 for hx of chronic bronchitis x 2 years.   Subjective:   PATIENT ID: Joy [name] GENDER: female DOB: 07/13/1941, MRN: [id number]   HPI  Chief Complaint  Patient presents with  . Follow-up    no coughing   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  When she was on Spiriva, she felt significant improvement in her shortness of breath and coughing. She has not been on it for some time due to not being able to afford her medication. Off Spiriva, she tires easily. Rarely has shortness of breath. Occasional cough and wheeze but relieved with albuterol. She stays active with her church on [employer]. She has three dogs including a toy poodle that sleeps with her in bed. Reports seasonal allergies.  Social History: Quit smoking in 1995. Smoked for 30 years x 1ppd.   I have personally reviewed patient's past medical/family/social history/allergies/current medications.  Past Medical History:  Diagnosis Date  . Ankylosing spondylitis (HCC)   . Arthritis    RHEUMATOID  . Bronchitis   . COPD (chronic obstructive pulmonary disease) (HCC)    CXR 01/11/18 showed mild COPD and chronic bronchitis  . CTS (carpal tunnel syndrome)   . Dysrhythmia    "irregularity" unknown at this time - being evaluated by cardiology  . Essential hypertension 11/24/2015  . GERD (gastroesophageal reflux disease)   . Hypercholesteremia   . Hyperlipidemia 11/24/2015  . Hypertension   . IBS (irritable bowel syndrome)   . Neuropathy   . OAB (overactive bladder)   . Osteoarthritis   . Osteoporosis   . RA (rheumatoid arthritis) (HCC)   . Rheumatoid arthritis (HCC) 11/24/2015  . Seasonal allergies     Allergies  Allergen Reactions  . Codeine Other (See Comments)    Lump in throat  . Tape Hives and Rash    Paper tape only  . Latex Rash  . Nickel Rash    Bumps Also other metals     Outpatient  Medications Prior to Visit  Medication Sig Dispense Refill  . albuterol (VENTOLIN HFA) 108 (90 Base) MCG/ACT inhaler Inhale 2 puffs into the lungs every 6 (six) hours as needed for wheezing or shortness of breath. 6.7 g 12  . atorvastatin (LIPITOR) 20 MG tablet Take 1 tablet (20 mg total) by mouth daily. 30 tablet 0  . Biotin 1000 MCG tablet Take 1 tablet (1 mg total) by mouth daily. 30 tablet 0  . Cholecalciferol (VITAMIN D3) 25 MCG (1000 UT) CAPS Take 1 capsule (1,000 Units total) by mouth daily. 30 capsule 0  . folic acid (FOLVITE) 1 MG tablet Take 1 tablet (1 mg total) by mouth daily. 30 tablet 0  . gabapentin (NEURONTIN) 300 MG capsule Take 300 mg by mouth at bedtime.   1  . inFLIXimab in sodium chloride 0.9 % Inject into the vein. Pt receives this infusion every 6 weeks    . irbesartan (AVAPRO) 300 MG tablet Take 1 tablet (300 mg total) by mouth daily. 30 tablet 0  . Magnesium 250 MG TABS Take 1 tablet (250 mg total) by mouth daily. 30 tablet 0  . Methotrexate Sodium (METHOTREXATE, PF,) 50 MG/2ML injection INJECT 1 ML ONCE WEEKLY. DISCARD VIAL AFTER USE    . pantoprazole (PROTONIX) 40 MG tablet Take 1 tablet (40 mg total) by mouth daily. 30 tablet 0  . Polyethyl Glycol-Propyl Glycol (SYSTANE) 0.4-0.3 % SOLN Apply  1 drop to eye 3 (three) times daily. 5 mL 0  . sertraline (ZOLOFT) 25 MG tablet Take 25 mg by mouth daily.    Marland Kitchen VITAMIN A PO Take by mouth daily.     . vitamin B-12 (CYANOCOBALAMIN) 1000 MCG tablet Take 1 tablet (1,000 mcg total) by mouth daily. 30 tablet 0  . Tiotropium Bromide Monohydrate (SPIRIVA RESPIMAT) 2.5 MCG/ACT AERS Inhale 2 puffs into the lungs daily. 1 Inhaler 0   No facility-administered medications prior to visit.     Review of Systems  Constitutional: Negative for chills, diaphoresis, fever, malaise/fatigue and weight loss.  HENT: Negative for congestion.   Respiratory: Negative for cough, hemoptysis, sputum production, shortness of breath and wheezing.    Cardiovascular: Negative for chest pain, palpitations and leg swelling.    Objective:   Vitals:   06/16/19 1131 06/16/19 1132  BP:  132/80  Pulse:  70  Temp: (!) 97.3 F (36.3 C)   TempSrc: Temporal   SpO2:  96%  Weight: 197 lb 12.8 oz (89.7 kg)   Height: 5\' 2"  (1.575 m)    Physical Exam: General: Well-appearing, no acute distress HENT: [place], AT Eyes: EOMI, no scleral icterus Respiratory: Clear to auscultation bilaterally.  No crackles, wheezing or rales Cardiovascular: RRR, -M/R/G, no JVD Extremities:-Edema,-tenderness Neuro: AAO x4, CNII-XII grossly intact Skin: Intact, no rashes or bruising Psych: Normal mood, normal affect  Chest imaging: CXR 01/11/18 - No pulmonary edema, effusion or infiltrate  PFT:  08/21/18 - Mild obstructive defect present with mildly reduced DLCO. TLC normal. No significant bronchodilator effect present however does not preclude benefit of bronchodilator therapy.  Imaging, labs and test noted above have been reviewed independently by me.    Assessment & Plan:   Mr. 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 Minimal symptoms off of bronchodilators. May need LAMA in the future for recurrent bronchitis but stable off of inhalers now.  Chronic bronchitis  --CONTINUE Albuterol every 4-6 hours as needed for shortness of breath or wheezing.  Return in about 6 months (around 12/15/2019).  Armeda Plumb Rodman Pickle, MD [facility] 06/16/2019 11:42 AM  Personal pager: [phone] If unanswered, please page CCM On-call: [phone]

## 2019-06-16 NOTE — Patient Instructions (Signed)
Chronic bronchitis with mild COPD, not in active exacerbation --CONTINUE Albuterol every 4-6 hours as needed for shortness of breath or wheezing.

## 2019-06-17 DIAGNOSIS — I10 Essential (primary) hypertension: Secondary | ICD-10-CM | POA: Diagnosis not present

## 2019-06-17 DIAGNOSIS — Z6825 Body mass index (BMI) 25.0-25.9, adult: Secondary | ICD-10-CM | POA: Diagnosis not present

## 2019-06-17 DIAGNOSIS — M48062 Spinal stenosis, lumbar region with neurogenic claudication: Secondary | ICD-10-CM | POA: Diagnosis not present

## 2019-07-06 ENCOUNTER — Other Ambulatory Visit: Payer: Self-pay | Admitting: Pharmacist

## 2019-07-06 ENCOUNTER — Ambulatory Visit: Payer: Self-pay | Admitting: Pharmacist

## 2019-07-06 NOTE — Patient Outreach (Addendum)
[facility]) Care Management  07/06/2019  Joy [name] 03/15/1941 [id number]   Patient was called regarding medication assistance. Unfortunately, she did not answer the phone. HIPAA compliant message was left on her voicemail.  Patient received Proventil from [employer] patient assistance program but was over income for Spiriva for 2020.  She had not met the $600 OOP required by [employer].  Plan. Call patient back in 8-10 weeks about 2021 patient assistance.  Elayne Guerin, PharmD, [place] Clinical Pharmacist [phone]

## 2019-07-08 ENCOUNTER — Other Ambulatory Visit: Payer: Self-pay | Admitting: Pharmacist

## 2019-07-08 NOTE — Patient Outreach (Signed)
[facility]) Care Management  07/08/2019  Joy [name] [birth year] [id number]  Patient was called to follow up on medication assistance. HIPAA identifiers were obtained.    Patient confirmed she has not met the $600 out-of-pocket requirement for [employer].  As such, the patient assistance process will not be continued with her for 2020.  Plan: Reach out to the patient at the beginning of next year about Proventil HFA through [employer].  Elayne Guerin, PharmD, [place] Clinical Pharmacist [phone]

## 2019-07-09 DIAGNOSIS — M461 Sacroiliitis, not elsewhere classified: Secondary | ICD-10-CM | POA: Diagnosis not present

## 2019-07-09 DIAGNOSIS — M7061 Trochanteric bursitis, right hip: Secondary | ICD-10-CM | POA: Diagnosis not present

## 2019-07-09 DIAGNOSIS — M25559 Pain in unspecified hip: Secondary | ICD-10-CM | POA: Insufficient documentation

## 2019-07-09 DIAGNOSIS — Z79899 Other long term (current) drug therapy: Secondary | ICD-10-CM | POA: Diagnosis not present

## 2019-07-09 DIAGNOSIS — G629 Polyneuropathy, unspecified: Secondary | ICD-10-CM | POA: Diagnosis not present

## 2019-07-09 DIAGNOSIS — M4316 Spondylolisthesis, lumbar region: Secondary | ICD-10-CM | POA: Diagnosis not present

## 2019-07-09 DIAGNOSIS — M48062 Spinal stenosis, lumbar region with neurogenic claudication: Secondary | ICD-10-CM | POA: Diagnosis not present

## 2019-07-09 DIAGNOSIS — M069 Rheumatoid arthritis, unspecified: Secondary | ICD-10-CM | POA: Diagnosis not present

## 2019-07-09 DIAGNOSIS — M7062 Trochanteric bursitis, left hip: Secondary | ICD-10-CM | POA: Diagnosis not present

## 2019-07-10 DIAGNOSIS — M7061 Trochanteric bursitis, right hip: Secondary | ICD-10-CM | POA: Diagnosis not present

## 2019-07-23 DIAGNOSIS — M0589 Other rheumatoid arthritis with rheumatoid factor of multiple sites: Secondary | ICD-10-CM | POA: Diagnosis not present

## 2019-07-23 DIAGNOSIS — Z79899 Other long term (current) drug therapy: Secondary | ICD-10-CM | POA: Diagnosis not present

## 2019-07-29 DIAGNOSIS — M461 Sacroiliitis, not elsewhere classified: Secondary | ICD-10-CM | POA: Diagnosis not present

## 2019-07-29 DIAGNOSIS — I10 Essential (primary) hypertension: Secondary | ICD-10-CM | POA: Diagnosis not present

## 2019-07-29 DIAGNOSIS — Z6836 Body mass index (BMI) 36.0-36.9, adult: Secondary | ICD-10-CM | POA: Diagnosis not present

## 2019-08-24 ENCOUNTER — Other Ambulatory Visit: Payer: Self-pay | Admitting: *Deleted

## 2019-08-24 ENCOUNTER — Encounter: Payer: Self-pay | Admitting: *Deleted

## 2019-08-24 NOTE — Patient Outreach (Signed)
[facility]) Care Management  [facility] Care Manager  08/24/2019   Joy [name] [birth year] [id number]   [place] Quarterly Outreach   Referral Date:  02/05/2019 Referral Source:  Primary MD Reason for Referral:  Medication Assistance Insurance:  Medicare   Outreach Attempt:  Successful telephone outreach to patient for follow up.  HIPAA verified with patient.  Patient reporting she and her family (daughter and son in law) all tested positive for COVID.  She tested positive on 08/20/2019 (tested on 08/18/2019) but has been experiencing symptoms since about 08/16/2019.  Denies shortness of breath, but endorses cough (controlled with over the counter medications), loss of taste and loss of smell (both that comes and goes).  States she was told her self isolation could end this coming Wednesday, January 6.  Discussed importance of continuing self isolation if symptoms continue and to contact provider if cough worsens and no longer controlled with over the counter medications. Patient reporting using her rescue inhaler once in the last week.  Encounter Medications:  Outpatient Encounter Medications as of 08/24/2019  Medication Sig  . albuterol (VENTOLIN HFA) 108 (90 Base) MCG/ACT inhaler Inhale 2 puffs into the lungs every 6 (six) hours as needed for wheezing or shortness of breath.  Marland Kitchen atorvastatin (LIPITOR) 20 MG tablet Take 1 tablet (20 mg total) by mouth daily.  . Biotin 1000 MCG tablet Take 1 tablet (1 mg total) by mouth daily.  . Cholecalciferol (VITAMIN D3) 25 MCG (1000 UT) CAPS Take 1 capsule (1,000 Units total) by mouth daily.  . folic acid (FOLVITE) 1 MG tablet Take 1 tablet (1 mg total) by mouth daily.  Marland Kitchen gabapentin (NEURONTIN) 300 MG capsule Take 300 mg by mouth at bedtime.   . irbesartan (AVAPRO) 300 MG tablet Take 1 tablet (300 mg total) by mouth daily.  . Magnesium 250 MG TABS Take 1 tablet (250 mg total) by mouth daily.  . pantoprazole (PROTONIX) 40 MG  tablet Take 1 tablet (40 mg total) by mouth daily.  Vladimir Faster Glycol-Propyl Glycol (SYSTANE) 0.4-0.3 % SOLN Apply 1 drop to eye 3 (three) times daily.  . sertraline (ZOLOFT) 25 MG tablet Take 25 mg by mouth daily.  Marland Kitchen VITAMIN A PO Take by mouth daily.   . vitamin B-12 (CYANOCOBALAMIN) 1000 MCG tablet Take 1 tablet (1,000 mcg total) by mouth daily.  Marland Kitchen inFLIXimab in sodium chloride 0.9 % Inject into the vein. Pt receives this infusion every 6 weeks  . Methotrexate Sodium (METHOTREXATE, PF,) 50 MG/2ML injection INJECT 1 ML ONCE WEEKLY. DISCARD VIAL AFTER USE   No facility-administered encounter medications on file as of 08/24/2019.    Functional Status:  In your present state of health, do you have any difficulty performing the following activities: 03/04/2019  Hearing? N  Vision? Y  Comment recent cataract surgery, near vision is bad  Difficulty concentrating or making decisions? Y  Comment forgetfull at times  Walking or climbing stairs? N  Comment takes time  Dressing or bathing? N  Doing errands, shopping? N  Preparing Food and eating ? N  Using the Toilet? N  Do you have problems with loss of bowel control? N  Managing your Medications? N  Managing your Finances? N  Housekeeping or managing your Housekeeping? N  Some recent data might be hidden    Fall/Depression Screening: Fall Risk  08/24/2019 06/02/2019 03/04/2019  Falls in the past year? 0 0 0  Risk for fall due to : Medication side effect;Impaired mobility;Impaired  balance/gait Medication side effect;Impaired balance/gait;Impaired mobility Medication side effect;Impaired mobility  Follow up Falls evaluation completed;Education provided;Falls prevention discussed Falls evaluation completed;Education provided;Falls prevention discussed Falls evaluation completed;Education provided;Falls prevention discussed   PHQ 2/9 Scores 03/04/2019 02/05/2019  PHQ - 2 Score 0 1   THN CM Care Plan Problem One     Most Recent Value  Care Plan  Problem One  Knowledge deficiet related to diagnosis of COPD and self care management.  Role Documenting the Problem One  [place] for Problem One  Active  [facility] Long Term Goal   Patient will report no hospitalizations within the next 90 days.  THN Long Term Goal Start Date  08/24/19  THN Long Term Goal Met Date  08/24/19  Interventions for Problem One Long Term Goal  Care plan and goals reviewed and discussed, discussed self isolation guidelines with COVID+ and not to end isolation until symptoms resolved, encouraged patient to contact provider if cough worsened for medication/treatment, reviewed medications and encourageed medication compliance, encouraged to keep and attend scheduled medical appointments, reviewed signs and symptoms of COPD exacerbation and action plan, discussed importance of rest and staying hydrated     Appointments:  Reports having Telehealth visit with primary care provider, Dr. Drema Dallas on 09/19/2018.  Encouraged to contact provider if symptoms worsen.  Plan: RN Health Coach will send primary care provider quarterly update. RN Health Coach will make next telephone outreach to patient within the month of February.  [place] [phone] Heitor [email or web address]

## 2019-09-06 DIAGNOSIS — Z20828 Contact with and (suspected) exposure to other viral communicable diseases: Secondary | ICD-10-CM | POA: Diagnosis not present

## 2019-09-21 DIAGNOSIS — I10 Essential (primary) hypertension: Secondary | ICD-10-CM | POA: Diagnosis not present

## 2019-09-21 DIAGNOSIS — E785 Hyperlipidemia, unspecified: Secondary | ICD-10-CM | POA: Diagnosis not present

## 2019-09-21 DIAGNOSIS — M069 Rheumatoid arthritis, unspecified: Secondary | ICD-10-CM | POA: Diagnosis not present

## 2019-09-23 ENCOUNTER — Other Ambulatory Visit: Payer: Self-pay | Admitting: *Deleted

## 2019-09-23 NOTE — Patient Outreach (Signed)
[facility]) Care Management  09/23/2019  Joy [name] 12-Feb-1941 [id number]   RN Health CoachQuarterlyOutreach  Referral Date: 02/05/2019 Referral Source:Primary MD Reason for Referral: Medication Assistance Insurance: Medicare   Outreach Attempt:  Outreach attempt #1 to patient for follow up COVID positive symptoms. No answer. RN Health Coach left HIPAA compliant voicemail message along with contact information.  Plan:  RN Health Coach will make another outreach attempt within the month of March if no return call back from patient.  Rhae Lerner RN [facility] Care Management  RN Health Coach [phone] [name]

## 2019-09-28 ENCOUNTER — Ambulatory Visit: Payer: Medicare Other

## 2019-09-29 ENCOUNTER — Encounter: Payer: Self-pay | Admitting: Pharmacist

## 2019-09-29 ENCOUNTER — Other Ambulatory Visit: Payer: Self-pay | Admitting: Pharmacist

## 2019-09-29 ENCOUNTER — Other Ambulatory Visit: Payer: Self-pay | Admitting: Pharmacy Technician

## 2019-09-29 NOTE — Patient Outreach (Signed)
[facility]) Care Management  09/29/2019  Joy [name] 1941-01-18 [id number]                                        Medication Assistance Referral  Referral From: [facility] RPh [name]  Medication/Company: [name] / [employer] Patient application portion:  Mailed Provider application portion: Faxed  to Dr. Verdell Face Provider address/fax verified via: Office website     Follow up:  Will follow up with patient in 5-15 business days to confirm application(s) have been received.  [name] P. [name], CPhT Mus[facility] Care Management [phone]

## 2019-09-29 NOTE — Patient Outreach (Signed)
[facility]) Care Management  [place]   09/29/2019  BRIONNA ROMANEK [birth year] [id number]  Reason for referral: medication assistance review for 2021  Referral medication(s): Proventil HFA Current insurance: [place]  PMHx:  Hypertension, history of total knee replacement, hyperlipidemia, lumbar spondylosis, Rheumatoid arthritis, polyneuropathy, and osteoarthritis of knee.   HPI:  Patient is an 79 year old female with the above mentioned medical conditions.  She reported feeling well and recently recovering from COVID-19. She said she went to Yoga today and was getting back to her normal routine. She also said Spiriva had been discontinued and that she did not need medication assistance with Proventil HFA but wondered if we could help with Linzess 145 mg. (She has started going to a new PCP and Linzess was recently started. When she went to the pharmacy to pick it up, it was >$200.  Patient was unsure if she had met her deductible.)  Objective: Allergies  Allergen Reactions  . Codeine Other (See Comments)    Lump in throat  . Tape Hives and Rash    Paper tape only  . Latex Rash  . Nickel Rash    Bumps Also other metals    Medications Reviewed Today    Reviewed by Elayne Guerin, [facility] (Pharmacist) on 09/29/19 at 1127  Med List Status: <None>  Medication Order Taking? Sig Documenting Provider Last Dose Status Informant  albuterol (VENTOLIN HFA) 108 (90 Base) MCG/ACT inhaler [id number] Yes Inhale 2 puffs into the lungs every 6 (six) hours as needed for wheezing or shortness of breath. Lauraine Rinne, NP Taking Active   atorvastatin (LIPITOR) 40 MG tablet [id number] Yes Take 40 mg by mouth daily. [provider] Taking Active   Biotin 1000 MCG tablet [id number] Yes Take 1 tablet (1 mg total) by mouth daily. Hennie Duos, MD Taking Active   Cholecalciferol (VITAMIN D3) 25 MCG (1000 UT) CAPS [id number] Yes Take 1 capsule (1,000 Units total)  by mouth daily. Hennie Duos, MD Taking Active   folic acid (FOLVITE) 1 MG tablet [id number] Yes Take 1 tablet (1 mg total) by mouth daily. Hennie Duos, MD Taking Active   gabapentin (NEURONTIN) 300 MG capsule [id number] Yes Take 300 mg by mouth at bedtime.  [provider] Taking Active Self           Med Note Nat Christen   Fri May 30, 2018 11:32 AM)    inFLIXimab in sodium chloride 0.9 % [id number] Yes Inject into the vein. Pt receives this infusion every 6 weeks [provider] Taking Active Self  irbesartan (AVAPRO) 300 MG tablet [id number] Yes Take 1 tablet (300 mg total) by mouth daily. Hennie Duos, MD Taking Active   linaclotide [facility]) 145 MCG CAPS capsule [id number] Yes Take 145 mcg by mouth daily before breakfast. Wynelle Fanny, DO Taking Active   Magnesium 250 MG TABS [id number] Yes Take 1 tablet (250 mg total) by mouth daily. Hennie Duos, MD Taking Active   Methotrexate Sodium (METHOTREXATE, PF,) 50 MG/2ML injection [id number] Yes INJECT 1 ML ONCE WEEKLY. (25 mg)  DISCARD VIAL AFTER USE [provider] Taking Active   pantoprazole (PROTONIX) 40 MG tablet [id number] Yes Take 1 tablet (40 mg total) by mouth daily. Hennie Duos, MD Taking Active   Polyethyl Glycol-Propyl Glycol (SYSTANE) 0.4-0.3 % Bailey Mech [id number] Yes Apply 1 drop to eye 3 (three) times daily. Hennie Duos, MD Taking Active   sertraline (ZOLOFT)  25 MG tablet [id number] Yes Take 25 mg by mouth daily. [provider] Taking Active Self  VITAMIN A PO [id number] Yes Take by mouth daily.  [provider] Taking Active   vitamin B-12 (CYANOCOBALAMIN) 1000 MCG tablet [id number] Yes Take 1 tablet (1,000 mcg total) by mouth daily. Hennie Duos, MD Taking Active           Assessment:   Medication Review Findings:  . Atorvastatin dose was increased from 38m to 454mby patient's new PCP (Dr. LiWynn Banker . Rolan [id number] added to her medication list and  was verified through [place] Patient reported Spiriva was discontinued.  Medication Assistance Findings:  Medication assistance needs identified: Linzess Patient is over-income for LIS/Extra Help  Additional medication assistance options reviewed with patient as warranted:  No other options identified  Plan: I will route patient assistance letter to THNChatomchnician who will coordinate patient assistance program application process for medications listed above.  [facility] will assist with obtaining all required documents from both patient and provider(s) and submit application(s) once completed.     Follow up with the patient in 8-10 weeks.  KatElayne GuerinharmD, BCAOneidainical Pharmacist ([phone]

## 2019-09-30 ENCOUNTER — Encounter (INDEPENDENT_AMBULATORY_CARE_PROVIDER_SITE_OTHER): Payer: Self-pay | Admitting: Family Medicine

## 2019-09-30 ENCOUNTER — Ambulatory Visit (INDEPENDENT_AMBULATORY_CARE_PROVIDER_SITE_OTHER): Payer: Medicare Other | Admitting: Family Medicine

## 2019-09-30 ENCOUNTER — Other Ambulatory Visit: Payer: Self-pay

## 2019-09-30 VITALS — BP 165/88 | HR 67 | Temp 98.1°F | Ht 62.0 in | Wt 189.0 lb

## 2019-09-30 DIAGNOSIS — Z0289 Encounter for other administrative examinations: Secondary | ICD-10-CM

## 2019-09-30 DIAGNOSIS — R739 Hyperglycemia, unspecified: Secondary | ICD-10-CM | POA: Diagnosis not present

## 2019-09-30 DIAGNOSIS — E7849 Other hyperlipidemia: Secondary | ICD-10-CM

## 2019-09-30 DIAGNOSIS — I10 Essential (primary) hypertension: Secondary | ICD-10-CM | POA: Diagnosis not present

## 2019-09-30 DIAGNOSIS — E559 Vitamin D deficiency, unspecified: Secondary | ICD-10-CM | POA: Diagnosis not present

## 2019-09-30 DIAGNOSIS — Z6834 Body mass index (BMI) 34.0-34.9, adult: Secondary | ICD-10-CM

## 2019-09-30 DIAGNOSIS — Z1331 Encounter for screening for depression: Secondary | ICD-10-CM

## 2019-09-30 DIAGNOSIS — E669 Obesity, unspecified: Secondary | ICD-10-CM

## 2019-09-30 DIAGNOSIS — M05762 Rheumatoid arthritis with rheumatoid factor of left knee without organ or systems involvement: Secondary | ICD-10-CM

## 2019-09-30 DIAGNOSIS — R5383 Other fatigue: Secondary | ICD-10-CM | POA: Diagnosis not present

## 2019-09-30 DIAGNOSIS — R0602 Shortness of breath: Secondary | ICD-10-CM | POA: Diagnosis not present

## 2019-09-30 NOTE — Progress Notes (Signed)
Chief Complaint:   OBESITY Joy [name] (MR# [id number]) is a 79 y.o. female who presents for evaluation and treatment of obesity and related comorbidities. Current BMI is Body mass index is 34.57 kg/m.Marland Kitchen Joy [name] has been struggling with her weight for many years and has been unsuccessful in either losing weight, maintaining weight loss, or reaching her healthy weight goal.  Joy [name] is currently in the action stage of change and ready to dedicate time achieving and maintaining a healthier weight. Joy [name] is interested in becoming our patient and working on intensive lifestyle modifications including (but not limited to) diet and exercise for weight loss.  Joy [name]'s habits were reviewed today and are as follows: Her family eats meals together, her desired weight loss is 29 to 39 pounds, she has been heavy most of her life, she started gaining weight in her late 51's, her heaviest weight ever was 215 pounds, she has significant food cravings issues, she snacks frequently in the evenings, she is frequently drinking liquids with calories, she sometimes makes poor food choices and she struggles with emotional eating.  Joy [name] has strawberry oatmeal or 1 to 2 eggs with toast and coffee with creamer for breakfast. Lunch consists of soup or [employer] Autumn soup (1/2 can) with crackers and fruit (bananas or grapes). Her snack consists of chips (handful), (craving). Dinner consists of fish 4.5 ounces, 1 cup of vegetables and 1/2 cup of a starch. Joy [name] has 2/3 to 1 cup after dinner.  Depression Screen Joy [name]'s Food and Mood (modified PHQ-9) score was negative.  Depression screen [name] 2/9 09/30/2019  Decreased Interest 0  Down, Depressed, Hopeless 0  PHQ - 2 Score 0  Altered sleeping 0  Tired, decreased energy 1  Change in appetite 1  Feeling bad or failure about yourself  0  Trouble concentrating 1  Moving slowly or fidgety/restless 0  Suicidal thoughts 0  PHQ-9 Score 3  Difficult doing  work/chores Not difficult at all   Subjective:   Other fatigue  Joy [name] admits to daytime somnolence and denies waking up still tired. Patent has a history of symptoms of daytime fatigue and hypertension. Joy [name] generally gets 9 or 10 hours of sleep per night, and states that she has generally restful sleep. Snoring is not present. Apneic episodes are not present. Epworth Sleepiness Score is 8. EKG ordered today shows 1 PVC, otherwise normal sinus rhythm at 72 BPM.  Shortness of breath on exertion Joy [name] notes increasing shortness of breath with exercising and seems to be worsening over time with weight gain. She notes getting out of breath sooner with activity than she used to. This has not gotten worse recently. Joy [name] denies shortness of breath at rest or orthopnea.  Vitamin D deficiency There is no vitamin D result in Epic. She is on 500 mg of vit D daily.   Other hyperlipidemia  Joy [name] has hyperlipidemia and her fasting lipid panel recently showed elevated LDL, so Atorvastatin was increased to 40 mg, but patient hasn't started it yet. She is attempting to improve her cholesterol levels with intensive lifestyle modification including a low saturated fat diet, exercise and weight loss.   Lab Results  Component Value Date   ALT 16 12/16/2015   AST 26 12/16/2015   ALKPHOS 76 12/16/2015   BILITOT 0.5 12/16/2015   No results found for: CHOL, HDL, LDLCALC, LDLDIRECT, TRIG, CHOLHDL  Essential hypertension Joy [name]'s blood pressure is uncontrolled today, but she didn't take her Irbesartan. She denies chest pain, chest pressure or headache.  EKG ordered today shows 1 PVC and patient saw Joy [name] previously.  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 She is on Remicade and Methotrexate.  Hyperglycemia  Joy [name] has a history elevated blood sugars in the past. She is not on medications.   Assessment/Plan:   Other fatigue Joy [name] does feel that her weight is causing her energy to be lower than it should be. Fatigue may be related to obesity, depression or many other causes. Labs and EKG will be ordered, and in the meanwhile, Joy [name] will focus on self care including making healthy food choices, increasing physical activity and focusing on stress reduction.  Shortness of breath on exertion Joy [name] does feel that she gets out of breath more easily that she used to when she exercises. Joy [name]'s shortness of breath appears to be obesity related and exercise induced. She has agreed to work on weight loss and gradually increase exercise to treat her exercise induced shortness of breath. Labs and indirect calorimetry will be ordered today. We will continue to monitor closely.  Vitamin D deficiency Low Vitamin D level contributes to fatigue and are associated with obesity, breast, and colon cancer. We will check vitamin D level today and she will follow-up for routine testing of Vitamin D, at least 2-3 times per year to avoid over-replacement.  Other hyperlipidemia Cardiovascular risk and specific lipid/LDL goals reviewed.  We discussed several lifestyle modifications today and [name] will begin to work on diet, exercise and weight loss efforts. We will check fasting lipid panel in 3 months. Orders and follow up as documented in patient record.   Counseling Intensive lifestyle modifications are the first line treatment for this issue. . Dietary changes: Increase soluble fiber. Decrease simple carbohydrates. . Exercise changes: Moderate to vigorous-intensity aerobic activity 150 minutes per week if tolerated. . Lipid-lowering medications: see documented in medical  record.  Essential hypertension Joy [name] is working on healthy weight loss and exercise to improve blood pressure control. We will order EKG today. We will watch for signs of hypotension as she continues her lifestyle modifications.  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  Hyperglycemia  Hgb A1c will be checked today and results with be discussed with Joy [name] in 2 weeks at her follow up visit. In the meanwhile Joy [name] was started on a lower simple carbohydrate diet and will work on weight loss efforts.  Class 1 obesity with serious comorbidity and body mass index (BMI) of 34.0 to 34.9 in adult, unspecified obesity type Joy [name] is currently in the action stage of change and her goal is to continue with weight loss efforts. I recommend Joy [name] begin the structured treatment plan as follows:  She has agreed to the Category 2 Plan +100 calories.  Exercise goals: No exercise has been prescribed at this time.   Behavioral modification strategies: increasing lean protein intake, increasing vegetables, meal planning and cooking strategies, keeping healthy foods in the home and planning for success.  She was informed of the importance of frequent follow-up visits to maximize her success with intensive lifestyle modifications for her multiple health conditions. She was informed we would discuss her lab results at her next  visit unless there is a critical issue that needs to be addressed sooner. Joy [name] agreed to keep her next visit at the agreed upon time to discuss these results.  Objective:   Blood pressure (!) 165/88, pulse 67, temperature 98.1 F (36.7 C), temperature source Oral, height 5\' 2"  (1.575 m), weight 189 lb (85.7 kg), SpO2 96 %. Body mass index is 34.57 kg/m.  EKG: Normal sinus rhythm, rate 72 BPM.  Indirect Calorimeter completed today shows a VO2 of 227 and a REE of 1582.  Her calculated  basal metabolic rate is thus her basal metabolic rate is better than expected.  General: Cooperative, alert, well developed, in no acute distress. HEENT: Conjunctivae and lids unremarkable. Cardiovascular: Regular rhythm.  Lungs: Normal work of breathing. Neurologic: No focal deficits.   Lab Results  Component Value Date   CREATININE 0.8 06/25/2018   BUN 19 06/25/2018   NA 138 06/25/2018   K 4.2 06/25/2018   CL 108 06/04/2018   CO2 27 06/04/2018   Lab Results  Component Value Date   ALT 16 12/16/2015   AST 26 12/16/2015   ALKPHOS 76 12/16/2015   BILITOT 0.5 12/16/2015   No results found for: HGBA1C No results found for: INSULIN No results found for: TSH No results found for: CHOL, HDL, LDLCALC, LDLDIRECT, TRIG, CHOLHDL Lab Results  Component Value Date   WBC 5.8 08/21/2018   HGB 12.7 08/21/2018   HCT 38.1 08/21/2018   MCV 92.4 08/21/2018   PLT 291.0 08/21/2018   No results found for: IRON, TIBC, FERRITIN  Obesity Behavioral Intervention Visit Documentation for Insurance:   Approximately 15 minutes were spent on the discussion below.  ASK: We discussed the diagnosis of obesity with 10/20/2018 today and Livier agreed to give Joy [name] permission to discuss obesity behavioral modification therapy today.  ASSESS: Evvie has the diagnosis of obesity and her BMI today is 34.56. Steve is in the action stage of change.   ADVISE: Jayleana was educated on the multiple health risks of obesity as well as the benefit of weight loss to improve her health. She was advised of the need for long term treatment and the importance of lifestyle modifications to improve her current health and to decrease her risk of future health problems.  AGREE: Multiple dietary modification options and treatment options were discussed and Kesha agreed to follow the recommendations documented in the above note.  ARRANGE: Lauralie was educated on the importance of frequent visits to treat obesity as  outlined per CMS and USPSTF guidelines and agreed to schedule her next follow up appointment today.  Attestation Statements:   Reviewed by clinician on day of visit: allergies, medications, problem list, medical history, surgical history, family history, social history, and previous encounter notes.  I, Joy [name], am acting as transcriptionist for [place] Crane, MD. I have reviewed the above documentation for accuracy and completeness, and I agree with the above. - Filbert Schilder, MD

## 2019-10-01 LAB — COMPREHENSIVE METABOLIC PANEL
ALT: 10 IU/L (ref 0–32)
AST: 18 IU/L (ref 0–40)
Albumin/Globulin Ratio: 1.6 (ref 1.2–2.2)
Albumin: 4 g/dL (ref 3.6–4.6)
Alkaline Phosphatase: 72 IU/L (ref 39–117)
BUN/Creatinine Ratio: 21 (ref 12–28)
BUN: 17 mg/dL (ref 8–27)
Bilirubin Total: 0.5 mg/dL (ref 0.0–1.2)
CO2: 22 mmol/L (ref 20–29)
Calcium: 10 mg/dL (ref 8.7–10.3)
Chloride: 105 mmol/L (ref 96–106)
Creatinine, Ser: 0.82 mg/dL (ref 0.57–1.00)
GFR calc Af Amer: 78 mL/min/{1.73_m2} (ref >59)
GFR calc non Af Amer: 67 mL/min/{1.73_m2} (ref >59)
Globulin, Total: 2.5 g/dL (ref 1.5–4.5)
Glucose: 83 mg/dL (ref 65–99)
Potassium: 4.2 mmol/L (ref 3.5–5.2)
Sodium: 140 mmol/L (ref 134–144)
Total Protein: 6.5 g/dL (ref 6.0–8.5)

## 2019-10-01 LAB — CBC WITH DIFFERENTIAL/PLATELET
Basophils Absolute: 0 10*3/uL (ref 0.0–0.2)
Basos: 1 %
EOS (ABSOLUTE): 0.2 10*3/uL (ref 0.0–0.4)
Eos: 4 %
Hematocrit: 42 % (ref 34.0–46.6)
Hemoglobin: 13.8 g/dL (ref 11.1–15.9)
Immature Grans (Abs): 0 10*3/uL (ref 0.0–0.1)
Immature Granulocytes: 0 %
Lymphocytes Absolute: 2.1 10*3/uL (ref 0.7–3.1)
Lymphs: 40 %
MCH: 31.7 pg (ref 26.6–33.0)
MCHC: 32.9 g/dL (ref 31.5–35.7)
MCV: 96 fL (ref 79–97)
Monocytes Absolute: 0.4 10*3/uL (ref 0.1–0.9)
Monocytes: 9 %
Neutrophils Absolute: 2.4 10*3/uL (ref 1.4–7.0)
Neutrophils: 46 %
Platelets: 262 10*3/uL (ref 150–450)
RBC: 4.36 x10E6/uL (ref 3.77–5.28)
RDW: 12.8 % (ref 11.7–15.4)
WBC: 5.2 10*3/uL (ref 3.4–10.8)

## 2019-10-01 LAB — T4, FREE: Free T4: 1.01 ng/dL (ref 0.82–1.77)

## 2019-10-01 LAB — TSH: TSH: 1.7 u[IU]/mL (ref 0.450–4.500)

## 2019-10-01 LAB — HEMOGLOBIN A1C
Est. average glucose Bld gHb Est-mCnc: 123 mg/dL
Hgb A1c MFr Bld: 5.9 % — ABNORMAL HIGH (ref 4.8–5.6)

## 2019-10-01 LAB — INSULIN, RANDOM: INSULIN: 13.8 u[IU]/mL (ref 2.6–24.9)

## 2019-10-01 LAB — T3: T3, Total: 92 ng/dL (ref 71–180)

## 2019-10-14 ENCOUNTER — Other Ambulatory Visit: Payer: Self-pay

## 2019-10-14 ENCOUNTER — Other Ambulatory Visit: Payer: Self-pay | Admitting: Pharmacy Technician

## 2019-10-14 ENCOUNTER — Encounter (INDEPENDENT_AMBULATORY_CARE_PROVIDER_SITE_OTHER): Payer: Self-pay | Admitting: Family Medicine

## 2019-10-14 ENCOUNTER — Ambulatory Visit (INDEPENDENT_AMBULATORY_CARE_PROVIDER_SITE_OTHER): Payer: Medicare Other | Admitting: Family Medicine

## 2019-10-14 VITALS — BP 142/88 | HR 63 | Temp 98.4°F | Ht 62.0 in | Wt 186.0 lb

## 2019-10-14 DIAGNOSIS — R7303 Prediabetes: Secondary | ICD-10-CM

## 2019-10-14 DIAGNOSIS — E669 Obesity, unspecified: Secondary | ICD-10-CM

## 2019-10-14 DIAGNOSIS — Z6834 Body mass index (BMI) 34.0-34.9, adult: Secondary | ICD-10-CM

## 2019-10-14 DIAGNOSIS — I10 Essential (primary) hypertension: Secondary | ICD-10-CM

## 2019-10-14 DIAGNOSIS — E559 Vitamin D deficiency, unspecified: Secondary | ICD-10-CM

## 2019-10-14 NOTE — Patient Outreach (Signed)
[facility]) Care Management  10/14/2019  Joy [name] 1941/03/31 [id number]    Successful call placed to patient regarding patient assistance application(s) for [employer] with [employer] , HIPAA identifiers verified.   Patient confirmed receiving the application but she has not mailed it back in. She informed she would work on that this week and mail back.  Follow up:  Will route note to [facility] RPh Nunzio Cobbs for case closure if document(s) have not been received in the next 15 business days.  Kaylub Detienne P. Shateria Paternostro, CPhT Mus[facility] Care Management [phone]

## 2019-10-15 DIAGNOSIS — M0589 Other rheumatoid arthritis with rheumatoid factor of multiple sites: Secondary | ICD-10-CM | POA: Diagnosis not present

## 2019-10-15 NOTE — Progress Notes (Signed)
Chief Complaint:   OBESITY Joy [name] is here to discuss her progress with her obesity treatment plan along with follow-up of her obesity related diagnoses. Joy [name] is on the Category 2 Plan and states she is following her eating plan approximately 80% of the time. Joy [name] states she is doing yoga 45 minutes 1 time per week.  Today's visit was #: 2 Starting weight: 189 lbs Starting date: 09/30/2019 Today's weight: 186 lbs Today's date: 10/14/2019 Total lbs lost to date: 3 Total lbs lost since last in-office visit: 3  Interim History: Joy [name] thinks the meal plan is quite meat heavy. Six ounces of meat at dinner was fairly heavy and patient had to force herself to eat everything. She is doing coffee creamer for her snack calories. Joy [name] denies hunger. She has an occasional craving for things like a cookie.  Subjective:   Prediabetes Joy [name] has a new diagnosis of prediabetes based on her elevated Hgb A1c of 5.9 and fasting insulin level of 13.8 (09/30/19). She was informed this puts her at greater risk of developing diabetes. Joy [name] is not on metformin. She is working on diet and exercise to decrease her risk of diabetes.  Lab Results  Component Value Date   HGBA1C 5.9 (H) 09/30/2019   Lab Results  Component Value Date   INSULIN 13.8 09/30/2019   Vitamin D deficiency There is no lab in Epic (unclear if order went through). She is currently on 500 IU vit D daily. She denies nausea, vomiting or muscle weakness.  Essential hypertension Joy [name]'s blood pressure is slightly elevated today. Patient voices that she forgot to take her medication today. She denies chest pain, chest pressure or headache.  BP Readings from Last 3 Encounters:  10/14/19 (!) 142/88  09/30/19 (!) 165/88  06/16/19 132/80   Lab Results  Component Value Date   CREATININE 0.82 09/30/2019   CREATININE 0.8 06/25/2018   CREATININE 0.89 06/04/2018    Assessment/Plan:   Prediabetes Joy [name] will  continue to work on weight loss, exercise, and decreasing simple carbohydrates to help decrease the risk of diabetes. We will follow up labs in 3 months.  Vitamin D deficiency Low Vitamin D level contributes to fatigue and are associated with obesity, breast, and colon cancer. She will continue to take Vitamin D @500  IU daily and we will check vitamin D level at the next appointment. Joy [name] will follow-up for routine testing of Vitamin D, at least 2-3 times per year to avoid over-replacement.  Essential hypertension Joy [name] is working on healthy weight loss and exercise to improve blood pressure control. We will follow up blood pressure at the next appointment. We will watch for signs of hypotension as she continues her lifestyle modifications.  Class 1 obesity with serious comorbidity and body mass index (BMI) of 34.0 to 34.9 in adult, unspecified obesity type Joy [name] is currently in the action stage of change. As such, her goal is to continue with weight loss efforts. She has agreed to the Category 2 Plan +100 calories.   Behavioral modification strategies: increasing lean protein intake, increasing vegetables, meal planning and cooking strategies, keeping healthy foods in the home and planning for success.  Joy [name] has agreed to follow-up with our clinic in 2 weeks. She was informed of the importance of frequent follow-up visits to maximize her success with intensive lifestyle modifications for her multiple health conditions.   Objective:   Blood pressure (!) 142/88, pulse 63, temperature 98.4 F (36.9 C), temperature source Oral, height 5'  2" (1.575 m), weight 186 lb (84.4 kg), SpO2 95 %. Body mass index is 34.02 kg/m.  General: Cooperative, alert, well developed, in no acute distress. HEENT: Conjunctivae and lids unremarkable. Cardiovascular: Regular rhythm.  Lungs: Normal work of breathing. Neurologic: No focal deficits.   Lab Results  Component Value Date   CREATININE 0.82  09/30/2019   BUN 17 09/30/2019   NA 140 09/30/2019   K 4.2 09/30/2019   CL 105 09/30/2019   CO2 22 09/30/2019   Lab Results  Component Value Date   ALT 10 09/30/2019   AST 18 09/30/2019   ALKPHOS 72 09/30/2019   BILITOT 0.5 09/30/2019   Lab Results  Component Value Date   HGBA1C 5.9 (H) 09/30/2019   Lab Results  Component Value Date   INSULIN 13.8 09/30/2019   Lab Results  Component Value Date   TSH 1.700 09/30/2019   No results found for: CHOL, HDL, LDLCALC, LDLDIRECT, TRIG, CHOLHDL Lab Results  Component Value Date   WBC 5.2 09/30/2019   HGB 13.8 09/30/2019   HCT 42.0 09/30/2019   MCV 96 09/30/2019   PLT 262 09/30/2019   No results found for: IRON, TIBC, FERRITIN   Attestation Statements:   Reviewed by clinician on day of visit: allergies, medications, problem list, medical history, surgical history, family history, social history, and previous encounter notes.  Time spent on visit including pre-visit chart review and post-visit care was 26 minutes.   I, Nevada Crane, am acting as transcriptionist for Reuben Likes, MD.  I have reviewed the above documentation for accuracy and completeness, and I agree with the above. - Debbra Riding, MD

## 2019-10-28 DIAGNOSIS — M0589 Other rheumatoid arthritis with rheumatoid factor of multiple sites: Secondary | ICD-10-CM | POA: Diagnosis not present

## 2019-10-28 DIAGNOSIS — Z6835 Body mass index (BMI) 35.0-35.9, adult: Secondary | ICD-10-CM | POA: Diagnosis not present

## 2019-10-28 DIAGNOSIS — M2550 Pain in unspecified joint: Secondary | ICD-10-CM | POA: Diagnosis not present

## 2019-10-28 DIAGNOSIS — Z1589 Genetic susceptibility to other disease: Secondary | ICD-10-CM | POA: Diagnosis not present

## 2019-10-28 DIAGNOSIS — Z79899 Other long term (current) drug therapy: Secondary | ICD-10-CM | POA: Diagnosis not present

## 2019-10-28 DIAGNOSIS — E669 Obesity, unspecified: Secondary | ICD-10-CM | POA: Diagnosis not present

## 2019-10-28 DIAGNOSIS — M150 Primary generalized (osteo)arthritis: Secondary | ICD-10-CM | POA: Diagnosis not present

## 2019-10-29 ENCOUNTER — Other Ambulatory Visit: Payer: Self-pay

## 2019-10-29 ENCOUNTER — Ambulatory Visit (INDEPENDENT_AMBULATORY_CARE_PROVIDER_SITE_OTHER): Payer: Medicare Other | Admitting: Family Medicine

## 2019-10-29 ENCOUNTER — Encounter (INDEPENDENT_AMBULATORY_CARE_PROVIDER_SITE_OTHER): Payer: Self-pay | Admitting: Family Medicine

## 2019-10-29 VITALS — BP 164/76 | HR 77 | Temp 97.8°F | Ht 62.0 in | Wt 185.0 lb

## 2019-10-29 DIAGNOSIS — I10 Essential (primary) hypertension: Secondary | ICD-10-CM

## 2019-10-29 DIAGNOSIS — E669 Obesity, unspecified: Secondary | ICD-10-CM

## 2019-10-29 DIAGNOSIS — R7303 Prediabetes: Secondary | ICD-10-CM | POA: Diagnosis not present

## 2019-10-29 DIAGNOSIS — Z6833 Body mass index (BMI) 33.0-33.9, adult: Secondary | ICD-10-CM | POA: Diagnosis not present

## 2019-10-29 NOTE — Progress Notes (Signed)
Chief Complaint:   OBESITY Joy [name] is here to discuss her progress with her obesity treatment plan along with follow-up of her obesity related diagnoses. Joy [name] is on the Category 2 Plan and states she is following her eating plan approximately 90% of the time. Joy [name] states she is doing yoga for 60 minutes and walking for 30 minutes 2 times per week.  Today's visit was #: 3 Starting weight: 189 lbs Starting date: 09/30/2019 Today's weight: 185 lbs Today's date: 10/29/2019 Total lbs lost to date: 4 lbs Total lbs lost since last in-office visit: 1 lb  Interim History: Joy [name] voices her daughter has had a second heart attack and was hospitalized.  This let patient to clean house.  She has learned she likes cream in her coffee.  She is trying to eat all the food on plan but finds the meat quantity to be excessive.  Subjective:   1. Essential hypertension Blood pressure is elevated today.  No chest pain, chest pressure, or headache.  She has not taken her blood pressure medication yet today.  BP Readings from Last 3 Encounters:  10/29/19 (!) 164/76  10/14/19 (!) 142/88  09/30/19 (!) 165/88   2. Prediabetes Joy [name] will continue to work on weight loss, exercise, and decreasing simple carbohydrates to help decrease the risk of diabetes. She is not taking metformin.  Assessment/Plan:   1. Essential hypertension Nga is working on healthy weight loss and exercise to improve blood pressure control. We will watch for signs of hypotension as she continues her lifestyle modifications.  2. Prediabetes Joy [name] will continue to work on weight loss, exercise, and decreasing simple carbohydrates to help decrease the risk of diabetes.   3. Class 1 obesity with serious comorbidity and body mass index (BMI) of 33.0 to 33.9 in adult, unspecified obesity type Joy [name] is currently in the action stage of change. As such, her goal is to continue with weight loss efforts. She has agreed to the  Category 2 Plan.   Exercise goals: Older adults should follow the adult guidelines. When older adults cannot meet the adult guidelines, they should be as physically active as their abilities and conditions will allow.   Behavioral modification strategies: increasing lean protein intake, increasing vegetables, meal planning and cooking strategies, keeping healthy foods in the home and planning for success.  Joy [name] has agreed to follow-up with our clinic in 2 weeks. She was informed of the importance of frequent follow-up visits to maximize her success with intensive lifestyle modifications for her multiple health conditions.   Objective:   Blood pressure (!) 164/76, pulse 77, temperature 97.8 F (36.6 C), temperature source Oral, height 5\' 2"  (1.575 m), weight 185 lb (83.9 kg), SpO2 95 %. Body mass index is 33.84 kg/m.  General: Cooperative, alert, well developed, in no acute distress. HEENT: Conjunctivae and lids unremarkable. Cardiovascular: Regular rhythm.  Lungs: Normal work of breathing. Neurologic: No focal deficits.   Lab Results  Component Value Date   CREATININE 0.82 09/30/2019   BUN 17 09/30/2019   NA 140 09/30/2019   K 4.2 09/30/2019   CL 105 09/30/2019   CO2 22 09/30/2019   Lab Results  Component Value Date   ALT 10 09/30/2019   AST 18 09/30/2019   ALKPHOS 72 09/30/2019   BILITOT 0.5 09/30/2019   Lab Results  Component Value Date   HGBA1C 5.9 (H) 09/30/2019   Lab Results  Component Value Date   INSULIN 13.8 09/30/2019   Lab Results  Component  Value Date   TSH 1.700 09/30/2019   Lab Results  Component Value Date   WBC 5.2 09/30/2019   HGB 13.8 09/30/2019   HCT 42.0 09/30/2019   MCV 96 09/30/2019   PLT 262 09/30/2019   Attestation Statements:   Reviewed by clinician on day of visit: allergies, medications, problem list, medical history, surgical history, family history, social history, and previous encounter notes.  Time spent on visit including  pre-visit chart review and post-visit care and charting was 13 minutes.   I, Water quality scientist, CMA, am acting as transcriptionist for Coralie Common, MD.  I have reviewed the above documentation for accuracy and completeness, and I agree with the above. - Ilene Qua, MD

## 2019-10-30 ENCOUNTER — Other Ambulatory Visit: Payer: Self-pay | Admitting: *Deleted

## 2019-10-30 NOTE — Patient Outreach (Signed)
[facility]) Care Management  10/30/2019  Joy [name] 05/24/1941 [id number]   RN Health CoachQuarterlyOutreach  Referral Date: 02/05/2019 Referral Source:Primary MD Reason for Referral: Medication Assistance Insurance: Medicare   Outreach Attempt:  Unsuccessful 2nd telephone outreach attempt to patient.  Plan:  RN Health Coach will make another outreach attempt within the month of April if no return call back from patient.  Rhae Lerner RN [facility] Care Management  RN Health Coach [phone] [name]

## 2019-11-03 DIAGNOSIS — M461 Sacroiliitis, not elsewhere classified: Secondary | ICD-10-CM | POA: Diagnosis not present

## 2019-11-03 DIAGNOSIS — M48062 Spinal stenosis, lumbar region with neurogenic claudication: Secondary | ICD-10-CM | POA: Diagnosis not present

## 2019-11-05 DIAGNOSIS — Z961 Presence of intraocular lens: Secondary | ICD-10-CM | POA: Diagnosis not present

## 2019-11-05 DIAGNOSIS — H16223 Keratoconjunctivitis sicca, not specified as Sjogren's, bilateral: Secondary | ICD-10-CM | POA: Diagnosis not present

## 2019-11-05 DIAGNOSIS — H43813 Vitreous degeneration, bilateral: Secondary | ICD-10-CM | POA: Diagnosis not present

## 2019-11-06 DIAGNOSIS — M4800 Spinal stenosis, site unspecified: Secondary | ICD-10-CM | POA: Diagnosis not present

## 2019-11-06 DIAGNOSIS — M069 Rheumatoid arthritis, unspecified: Secondary | ICD-10-CM | POA: Diagnosis not present

## 2019-11-06 DIAGNOSIS — K5900 Constipation, unspecified: Secondary | ICD-10-CM | POA: Diagnosis not present

## 2019-11-12 ENCOUNTER — Encounter (INDEPENDENT_AMBULATORY_CARE_PROVIDER_SITE_OTHER): Payer: Self-pay | Admitting: Family Medicine

## 2019-11-12 ENCOUNTER — Other Ambulatory Visit: Payer: Self-pay

## 2019-11-12 ENCOUNTER — Ambulatory Visit (INDEPENDENT_AMBULATORY_CARE_PROVIDER_SITE_OTHER): Payer: Medicare Other | Admitting: Family Medicine

## 2019-11-12 VITALS — BP 136/82 | HR 69 | Temp 97.7°F | Ht 62.0 in | Wt 184.0 lb

## 2019-11-12 DIAGNOSIS — E669 Obesity, unspecified: Secondary | ICD-10-CM | POA: Diagnosis not present

## 2019-11-12 DIAGNOSIS — I10 Essential (primary) hypertension: Secondary | ICD-10-CM

## 2019-11-12 DIAGNOSIS — E7849 Other hyperlipidemia: Secondary | ICD-10-CM

## 2019-11-12 DIAGNOSIS — Z6833 Body mass index (BMI) 33.0-33.9, adult: Secondary | ICD-10-CM | POA: Diagnosis not present

## 2019-11-12 NOTE — Progress Notes (Signed)
Chief Complaint:   OBESITY Joy [name] is here to discuss her progress with her obesity treatment plan along with follow-up of her obesity related diagnoses. [name] is on the Category 2 Plan and states she is following her eating plan approximately 80-90% of the time. [name] states she is doing yoga 45 minutes 2 times per week and walking 30 minutes 2 times per week.  Today's visit was #: 4 Starting weight: 189 lbs Starting date: 09/30/2019 Today's weight: 184 lbs Today's date: 11/12/2019 Total lbs lost to date: 5 Total lbs lost since last in-office visit: 1  Interim History: Over the last few weeks Joy [name] has been working on following her meal plan and walking more. She reports getting more protein in; is probably getting in ~4 oz of protein at lunch and dinner. She is working on getting more protein. She is planning on joining water aerobics. She reports few splurges.  Subjective:   Essential hypertension. Blood pressure is well controlled. No chest pain, chest pressure, or headache. Joy [name] is on irbesartan.   BP Readings from Last 3 Encounters:  11/12/19 136/82  10/29/19 (!) 164/76  10/14/19 (!) 142/88   Lab Results  Component Value Date   CREATININE 0.82 09/30/2019   CREATININE 0.8 06/25/2018   CREATININE 0.89 06/04/2018   Other hyperlipidemia. There is no lipid panel found in Epic. Joy [name] is on Lipitor.   No results found for: CHOL, HDL, LDLCALC, LDLDIRECT, TRIG, CHOLHDL Lab Results  Component Value Date   ALT 10 09/30/2019   AST 18 09/30/2019   ALKPHOS 72 09/30/2019   BILITOT 0.5 09/30/2019   The ASCVD Risk score [name], et al., 2013) failed to calculate for the following reasons:   The 2013 ASCVD risk score is only valid for ages 69 to 36  Assessment/Plan:   Essential hypertension. [name] is working on healthy weight loss and exercise to improve blood pressure control. We will watch for signs of hypotension as she continues her lifestyle  modifications. She will have follow-up blood pressure at her next appointment. [name] will have a lipid panel at her next law draw.  Other hyperlipidemia. Cardiovascular risk and specific lipid/LDL goals reviewed.  We discussed several lifestyle modifications today and [name] will continue to work on diet, exercise and weight loss efforts. Orders and follow up as documented in patient record.   Counseling Intensive lifestyle modifications are the first line treatment for this issue. . Dietary changes: Increase soluble fiber. Decrease simple carbohydrates. . Exercise changes: Moderate to vigorous-intensity aerobic activity 150 minutes per week if tolerated. . Lipid-lowering medications: see documented in medical record.  Class 1 obesity with serious comorbidity and body mass index (BMI) of 33.0 to 33.9 in adult, unspecified obesity type.  Joy [name] is currently in the action stage of change. As such, her goal is to continue with weight loss efforts. She has agreed to the Category 2 Plan.   Exercise goals: Older adults should follow the adult guidelines. When older adults cannot meet the adult guidelines, they should be as physically active as their abilities and conditions will allow.   Behavioral modification strategies: increasing lean protein intake, increasing vegetables, meal planning and cooking strategies, keeping healthy foods in the home and planning for success.  Joy [name] has agreed to follow-up with our clinic in 2-3 weeks. She was informed of the importance of frequent follow-up visits to maximize her success with intensive lifestyle modifications for her multiple health conditions.   Objective:   Blood  pressure 136/82, pulse 69, temperature 97.7 F (36.5 C), temperature source Oral, height 5\' 2"  (1.575 m), weight 184 lb (83.5 kg), SpO2 98 %. Body mass index is 33.65 kg/m.  General: Cooperative, alert, well developed, in no acute distress. HEENT: Conjunctivae and lids  unremarkable. Cardiovascular: Regular rhythm.  Lungs: Normal work of breathing. Neurologic: No focal deficits.   Lab Results  Component Value Date   CREATININE 0.82 09/30/2019   BUN 17 09/30/2019   NA 140 09/30/2019   K 4.2 09/30/2019   CL 105 09/30/2019   CO2 22 09/30/2019   Lab Results  Component Value Date   ALT 10 09/30/2019   AST 18 09/30/2019   ALKPHOS 72 09/30/2019   BILITOT 0.5 09/30/2019   Lab Results  Component Value Date   HGBA1C 5.9 (H) 09/30/2019   Lab Results  Component Value Date   INSULIN 13.8 09/30/2019   Lab Results  Component Value Date   TSH 1.700 09/30/2019   No results found for: CHOL, HDL, LDLCALC, LDLDIRECT, TRIG, CHOLHDL Lab Results  Component Value Date   WBC 5.2 09/30/2019   HGB 13.8 09/30/2019   HCT 42.0 09/30/2019   MCV 96 09/30/2019   PLT 262 09/30/2019   No results found for: IRON, TIBC, FERRITIN  Attestation Statements:   Reviewed by clinician on day of visit: allergies, medications, problem list, medical history, surgical history, family history, social history, and previous encounter notes.  Time spent on visit including pre-visit chart review and post-visit charting and care was 13 minutes.   I, Michaelene Song, am acting as transcriptionist for Coralie Common, MD   I have reviewed the above documentation for accuracy and completeness, and I agree with the above. - Ilene Qua, MD

## 2019-11-24 ENCOUNTER — Other Ambulatory Visit: Payer: Self-pay | Admitting: Pharmacist

## 2019-11-24 NOTE — Patient Outreach (Signed)
[facility] ([facility])  [facility] Quality Pharmacy Team    [facility] pharmacy case will be closed as our team is transitioning from the [facility] Care Management Department into the [facility] Quality Department and will no longer be using [facility] for documentation purposes.      Dimetrius Montfort J. Latressa Harries, PharmD, BCACP [facility] Clinical Pharmacist [phone]   

## 2019-11-25 DIAGNOSIS — M48062 Spinal stenosis, lumbar region with neurogenic claudication: Secondary | ICD-10-CM | POA: Diagnosis not present

## 2019-11-26 ENCOUNTER — Ambulatory Visit (INDEPENDENT_AMBULATORY_CARE_PROVIDER_SITE_OTHER): Payer: Medicare Other | Admitting: Family Medicine

## 2019-11-26 DIAGNOSIS — M0589 Other rheumatoid arthritis with rheumatoid factor of multiple sites: Secondary | ICD-10-CM | POA: Diagnosis not present

## 2019-11-27 ENCOUNTER — Ambulatory Visit (INDEPENDENT_AMBULATORY_CARE_PROVIDER_SITE_OTHER): Payer: Medicare Other | Admitting: Cardiovascular Disease

## 2019-11-27 ENCOUNTER — Encounter: Payer: Self-pay | Admitting: Cardiovascular Disease

## 2019-11-27 ENCOUNTER — Other Ambulatory Visit: Payer: Self-pay

## 2019-11-27 VITALS — BP 132/73 | HR 72 | Ht 62.0 in | Wt 189.0 lb

## 2019-11-27 DIAGNOSIS — E7800 Pure hypercholesterolemia, unspecified: Secondary | ICD-10-CM | POA: Diagnosis not present

## 2019-11-27 DIAGNOSIS — I10 Essential (primary) hypertension: Secondary | ICD-10-CM

## 2019-11-27 NOTE — Patient Instructions (Signed)
Medication Instructions:  Your physician recommends that you continue on your current medications as directed. Please refer to the Current Medication list given to you today.  *If you need a refill on your cardiac medications before your next appointment, please call your pharmacy*  Lab Work: NONE I Testing/Procedures: NONE  Follow-Up: At [facility], you and your health needs are our priority.  As part of our continuing mission to provide you with exceptional heart care, we have created designated Provider Care Teams.  These Care Teams include your primary Cardiologist (physician) and Advanced Practice Providers (APPs -  Physician Assistants and Nurse Practitioners) who all work together to provide you with the care you need, when you need it.  We recommend signing up for the patient portal called "MyChart".  Sign up information is provided on this After Visit Summary.  MyChart is used to connect with patients for Virtual Visits (Telemedicine).  Patients are able to view lab/test results, encounter notes, upcoming appointments, etc.  Non-urgent messages can be sent to your provider as well.   To learn more about what you can do with MyChart, go to [email or web address].    Your next appointment:   12 month(s)  You will receive a reminder letter in the mail two months in advance. If you don't receive a letter, please call our office to schedule the follow-up appointment.  The format for your next appointment:   In Person  Provider:   You may see DR [facility]  or one of the following Advanced Practice Providers on your designated Care Team:    Corine Shelter, PA-C  [place], [place]  Edd Fabian, [place]

## 2019-11-27 NOTE — Progress Notes (Signed)
Cardiology Office Note   Date:  11/27/2019   ID:  Joy, [name] 11-23-1940, MRN [id number]  PCP:  Wynelle Fanny, DO  Cardiologist:   Skeet Latch, MD   No chief complaint on file.    History of Present Illness: Joy [name] is a 79 y.o. female with hypertension, hyperlipidemia, and COPDhere for follow up.  She was first seen 11/2015 for an evaluation of pre-surgical cardiovascular risk prior to knee surgery.  Joy [name] has a strong family history of cardiac disease and was noted to have EKG changes. Joy [name] fell and fractured her L tibial plateau on 09/02/15. In pre-op clearance appointment she had an EKG that revealed nonspecific ST depression. She was asymptomatic but not very active so she was referred for nuclear stress testing 11/2015 that revealed LVEF 54% and no ischemia.   She reported palpitations and wore a 24 hour Holter that showed occasional PACs and PVCs.   Since her last appointment Joy [name] has been well.  She struggles with pain from her arthritis.  She gets infusions and had two epidurals yesterday.  She does yoga twice per week and plans to get back into water aerobics.  She has no chest pain and her breathing has been stable.  She rarely has to use her inhaler.  She denies lower extremity edema, orthopnea, or PND.  Her PCP recently increased atorvastatin due to her high cholesterol.  She has not had it rechecked since that time.  Her daughter recently got married, but they are waiting for Joy large waiting until after Joy pandemic.  She did get her coronavirus vaccines.  Past Medical History:  Diagnosis Date  . Ankylosing spondylitis ([place])   . Anxiety   . Arthritis    RHEUMATOID  . Back pain   . Bronchitis   . Constipation   . COPD (chronic obstructive pulmonary disease) ([place])    CXR 01/11/18 showed mild COPD and chronic bronchitis  . CTS (carpal tunnel syndrome)   . Dry eye   . Dry mouth   . Dysrhythmia    "irregularity" unknown at this time -  being evaluated by cardiology  . Essential hypertension 11/24/2015  . GERD (gastroesophageal reflux disease)   . HLA B27 (HLA B27 positive)   . Hypercholesteremia   . Hyperlipidemia 11/24/2015  . Hypertension   . IBS (irritable bowel syndrome)   . Joint pain   . Neuropathy   . OAB (overactive bladder)   . Obesity   . Osteoarthritis   . Osteoporosis   . RA (rheumatoid arthritis) ([place])   . Rheumatoid arthritis ([place]) 11/24/2015  . Sciatica   . Seasonal allergies   . Spinal stenosis     Past Surgical History:  Procedure Laterality Date  . ABDOMINAL HYSTERECTOMY  1995  . BACK SURGERY    . BREAST SURGERY     REDUCTION  . CATARACT EXTRACTION W/PHACO  08/01/2012   Procedure: CATARACT EXTRACTION PHACO AND INTRAOCULAR LENS PLACEMENT (IOC);  Surgeon: Marylynn Pearson, MD;  Location: [facility];  Service: Ophthalmology;  Laterality: Right;  . HERNIA REPAIR     RIGHT ING.  . JOINT REPLACEMENT  2014   rt total knee  . TONSILLECTOMY    . 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 albuterol (VENTOLIN HFA) 108 (  90 Base) MCG/ACT inhaler Inhale 2 puffs into Joy lungs every 6 (six) hours as needed for wheezing or shortness of breath. 6.7 g 12  . atorvastatin (LIPITOR) 40 MG tablet Take 40 mg by mouth daily.    . Biotin 1000 MCG tablet Take 1 tablet (1 mg total) by mouth daily. 30 tablet 0  . Cholecalciferol (VITAMIN D3) 25 MCG (1000 UT) CAPS Take 1 capsule (1,000 Units total) by mouth daily. 30 capsule 0  . cycloSPORINE (RESTASIS OP) Apply to eye.    . folic acid (FOLVITE) 1 MG tablet Take 1 tablet (1 mg total) by mouth daily. 30 tablet 0  . gabapentin (NEURONTIN) 300 MG capsule Take 300 mg by mouth at bedtime.   1  . inFLIXimab (REMICADE IV) Inject 100 mg into Joy vein every 6 (six) weeks.    . inFLIXimab in  sodium chloride 0.9 % Inject into Joy vein. Pt receives this infusion every 6 weeks    . irbesartan (AVAPRO) 300 MG tablet Take 1 tablet (300 mg total) by mouth daily. 30 tablet 0  . Magnesium 250 MG TABS Take 1 tablet (250 mg total) by mouth daily. 30 tablet 0  . Methotrexate Sodium (METHOTREXATE, PF,) 50 MG/2ML injection INJECT 1 ML ONCE WEEKLY. (25 mg)  DISCARD VIAL AFTER USE    . pantoprazole (PROTONIX) 40 MG tablet Take 1 tablet (40 mg total) by mouth daily. 30 tablet 0  . sertraline (ZOLOFT) 25 MG tablet Take 25 mg by mouth daily.    Marland Kitchen VITAMIN A PO Take 2,400 mcg by mouth daily.     . vitamin B-12 (CYANOCOBALAMIN) 1000 MCG tablet Take 1 tablet (1,000 mcg total) by mouth daily. 30 tablet 0   No current facility-administered medications for this visit.    Allergies:   Codeine, Tape, Latex, and Nickel    Social History:  Joy [name]  reports that she quit smoking about 24 years ago. She has a 35.00 pack-year smoking history. She has never used smokeless tobacco. She reports current alcohol use. She reports that she does not use drugs.   Family History:  Joy [name]'s family history includes Arthritis in her sister, sister, and sister; Cerebral aneurysm in her mother; Heart attack in her father; Heart disease in her father and mother; Hypertension in her father, mother, sister, sister, sister, sister, and sister; Obesity in her mother; Stroke in her sister.    ROS:  Please see Joy history of present illness.   Otherwise, review of systems are positive for none.   All other systems are reviewed and negative.    PHYSICAL EXAM: VS:  BP 132/73   Pulse 72   Ht 5' 2"  (1.575 m)   Wt 189 lb (85.7 kg)   SpO2 95%   BMI 34.57 kg/m  , BMI Body mass index is 34.57 kg/m. GENERAL:  Well appearing HEENT:  Pupils equal round and reactive, fundi not visualized, oral mucosa unremarkable NECK:  No jugular venous distention, waveform within normal limits, carotid upstroke brisk and symmetric, no  bruits LUNGS:  Clear to auscultation bilaterally HEART:  RRR.  PMI not displaced or sustained,S1 and S2 within normal limits, no S3, no S4, no clicks, no rubs, no murmurs ABD:  Flat, positive bowel sounds normal in frequency in pitch, no bruits, no rebound, no guarding, no midline pulsatile mass, no hepatomegaly, no splenomegaly EXT:  2 plus pulses throughout, no edema, no cyanosis no clubbing SKIN:  No rashes no nodules NEURO:  Cranial nerves II  through XII grossly intact, motor grossly intact throughout [facility]:  Cognitively intact, oriented to person place and time   EKG:  EKG is ordered today. Joy ekg ordered 12/04/15 demonstrates sinus rhythm. Rate 83 bpm. Left anterior fascicular block. Poor R-wave progression. Unspecific ST/T changes. 11/27/19: Sinus rhythm.  Rate 72 bpm.  Lexiscan Myoview 12/02/15:  Normal Lexiscan myoview  Nuclear stress EF: 54%.  There was no ST segment deviation noted during stress.  Joy study is normal.  This is a low risk study.  24 Hour Holter Monitor 11/2015:  Quality: Fair. Baseline artifact.  Average heart rate: 79 bpm Max heart rate: 111 bpm Min heart rate: 60 bpm Pauses >2.5 seconds: 0  Occasional PACs and PVCs  Recent Labs: 09/30/2019: ALT 10; BUN 17; Creatinine, Ser 0.82; Hemoglobin 13.8; Platelets 262; Potassium 4.2; Sodium 140; TSH 1.700    Lipid Panel No results found for: CHOL, TRIG, HDL, CHOLHDL, VLDL, LDLCALC, LDLDIRECT    Wt Readings from Last 3 Encounters:  11/27/19 189 lb (85.7 kg)  11/12/19 184 lb (83.5 kg)  10/29/19 185 lb (83.9 kg)      ASSESSMENT AND PLAN:  # Hypertension: Blood pressure well-controlled.  Continue irbesartan.  # Hyperlipidemia: Lipids were checked by her PCP in September and were above goal.  Joy increase Joy atorvastatin but has not been rechecked.  She will follow up with them to see when she wants this done again Current medicines are reviewed at length with Joy [name] today.  Joy [name]  has concerns regarding medicines.  Joy following changes have been made:  no change  Labs/ tests ordered today include:   No orders of Joy defined types were placed in this encounter.    Disposition:   FU with Earma Nicolaou C. Oval Linsey, MD, [facility] in 1 year.    This note was written with Joy assistance of speech recognition software.  Please excuse any transcriptional errors.  Signed, Maurita Havener C. Oval Linsey, MD, [facility]  11/27/2019 3:32 PM    [facility]

## 2019-12-01 ENCOUNTER — Ambulatory Visit (INDEPENDENT_AMBULATORY_CARE_PROVIDER_SITE_OTHER): Payer: Medicare Other | Admitting: Family Medicine

## 2019-12-01 ENCOUNTER — Other Ambulatory Visit: Payer: Self-pay

## 2019-12-01 ENCOUNTER — Encounter (INDEPENDENT_AMBULATORY_CARE_PROVIDER_SITE_OTHER): Payer: Self-pay | Admitting: Family Medicine

## 2019-12-01 VITALS — BP 134/80 | HR 75 | Temp 98.0°F | Ht 62.0 in | Wt 183.0 lb

## 2019-12-01 DIAGNOSIS — I10 Essential (primary) hypertension: Secondary | ICD-10-CM

## 2019-12-01 DIAGNOSIS — E669 Obesity, unspecified: Secondary | ICD-10-CM

## 2019-12-01 DIAGNOSIS — Z6833 Body mass index (BMI) 33.0-33.9, adult: Secondary | ICD-10-CM

## 2019-12-01 DIAGNOSIS — R7303 Prediabetes: Secondary | ICD-10-CM | POA: Diagnosis not present

## 2019-12-01 NOTE — Progress Notes (Signed)
Chief Complaint:   OBESITY Torra is here to discuss her progress with her obesity treatment plan along with follow-up of her obesity related diagnoses. Linnea is on the Category 2 Plan and states she is following her eating plan approximately 75% of the time. Eleah states she is walking for 30 minutes 5 times per week.  Today's visit was #: 5 Starting weight: 189 lbs Starting date: 09/30/2019 Today's weight: 183 lbs Today's date: 12/01/2019 Total lbs lost to date: 6 lbs Total lbs lost since last in-office visit: 1 lb  Interim History: Alverda is trying to wean herself off sweets.  She says she felt very satisfied with her strawberries and sugar-free whipped cream.  She was previously having a significant amount of back pain.  She reports that she may not be getting all of her protein in, but she is trying to make up with protein equivalents.  She is using her snack calories for coffee creamer.  Subjective:   1. Prediabetes Elyna has a diagnosis of prediabetes based on her elevated HgA1c and was informed this puts her at greater risk of developing diabetes. She continues to work on diet and exercise to decrease her risk of diabetes. She denies nausea or hypoglycemia.  She is not on any medication for this.  Lab Results  Component Value Date   HGBA1C 5.9 (H) 09/30/2019   Lab Results  Component Value Date   INSULIN 13.8 09/30/2019   2. Essential hypertension Review: taking medications as instructed, no medication side effects noted, no chest pain on exertion, no dyspnea on exertion, no swelling of ankles.  Blood pressure is controlled today.  She saw Dr. Oval Linsey with no changes noted.  BP Readings from Last 3 Encounters:  12/01/19 134/80  11/27/19 132/73  11/12/19 136/82   Assessment/Plan:   1. Prediabetes Adeline will continue to work on weight loss, exercise, and decreasing simple carbohydrates to help decrease the risk of diabetes.   2. Essential hypertension  Anthonette is working on healthy weight loss and exercise to improve blood pressure control. We will watch for signs of hypotension as she continues her lifestyle modifications.  3. Class 1 obesity with serious comorbidity and body mass index (BMI) of 33.0 to 33.9 in adult, unspecified obesity type Tianni is currently in the action stage of change. As such, her goal is to continue with weight loss efforts. She has agreed to the Category 2 Plan.   Exercise goals: As is.  Behavioral modification strategies: increasing lean protein intake, increasing vegetables and meal planning and cooking strategies.  Britney has agreed to follow-up with our clinic in 3 weeks. She was informed of the importance of frequent follow-up visits to maximize her success with intensive lifestyle modifications for her multiple health conditions.   Objective:   Blood pressure 134/80, pulse 75, temperature 98 F (36.7 C), temperature source Oral, height 5\' 2"  (1.575 m), weight 183 lb (83 kg), SpO2 92 %. Body mass index is 33.47 kg/m.  General: Cooperative, alert, well developed, in no acute distress. HEENT: Conjunctivae and lids unremarkable. Cardiovascular: Regular rhythm.  Lungs: Normal work of breathing. Neurologic: No focal deficits.   Lab Results  Component Value Date   CREATININE 0.82 09/30/2019   BUN 17 09/30/2019   NA 140 09/30/2019   K 4.2 09/30/2019   CL 105 09/30/2019   CO2 22 09/30/2019   Lab Results  Component Value Date   ALT 10 09/30/2019   AST 18 09/30/2019   ALKPHOS 72  09/30/2019   BILITOT 0.5 09/30/2019   Lab Results  Component Value Date   HGBA1C 5.9 (H) 09/30/2019   Lab Results  Component Value Date   INSULIN 13.8 09/30/2019   Lab Results  Component Value Date   TSH 1.700 09/30/2019   Lab Results  Component Value Date   WBC 5.2 09/30/2019   HGB 13.8 09/30/2019   HCT 42.0 09/30/2019   MCV 96 09/30/2019   PLT 262 09/30/2019   Attestation Statements:   Reviewed by  clinician on day of visit: allergies, medications, problem list, medical history, surgical history, family history, social history, and previous encounter notes.  Time spent on visit including pre-visit chart review and post-visit care and charting was 15 minutes.   I, Insurance claims handler, CMA, am acting as transcriptionist for Reuben Likes, MD.  I have reviewed the above documentation for accuracy and completeness, and I agree with the above. - Debbra Riding, MD

## 2019-12-07 ENCOUNTER — Ambulatory Visit: Payer: Self-pay | Admitting: *Deleted

## 2019-12-15 ENCOUNTER — Ambulatory Visit: Payer: Medicare Other | Admitting: Podiatry

## 2019-12-18 ENCOUNTER — Other Ambulatory Visit: Payer: Self-pay | Admitting: *Deleted

## 2019-12-18 NOTE — Patient Outreach (Signed)
[facility]) Care Management  12/18/2019  URANIA PEARLMAN 11-05-1940 [id number]   RN Health CoachQuarterlyOutreach  Referral Date: 02/05/2019 Referral Source:Primary MD Reason for Referral: Medication Assistance Insurance: Medicare   Outreach Attempt:  Outreach attempt #3 to patient for follow up and confirmation of change of primary care providers. No answer. RN Health Coach left HIPAA compliant voicemail message along with contact information.  Plan:  RN Health Coach will make another outreach attempt within the month of May if no return call back from patient.  Rhae Lerner RN [facility] Care Management  RN Health Coach [phone] [email or web address]

## 2019-12-21 ENCOUNTER — Other Ambulatory Visit: Payer: Self-pay | Admitting: *Deleted

## 2019-12-21 ENCOUNTER — Encounter: Payer: Self-pay | Admitting: *Deleted

## 2019-12-21 NOTE — Patient Outreach (Signed)
Joy [name] [name]) Care Management  [facility] Care Manager  12/21/2019   Joy [name] 1941-03-24 [id number]   [place] Quarterly Outreach   Referral Date:  02/05/2019 Referral Source:  Primary MD Reason for Referral:  Medication Assistance Insurance:  Medicare   Outreach Attempt:  Received telephone call back from patient from previous message left.  HIPAA verified with patient.  Patient reporting she has been having intense back and leg pain.  It is being evaluated and thought to be sciatica.  Is due to have MRI and to see neurosurgeon soon. Denies any recent falls.  Denies any shortness of breath and states she has only needed rescue inhaler about once in the past month.  Does report back pain is becoming limiting in her abilities and she is hoping to return to yoga class soon.  Continues to work with [facility].   Encounter Medications:  Outpatient Encounter Medications as of 12/21/2019  Medication Sig  . albuterol (VENTOLIN HFA) 108 (90 Base) MCG/ACT inhaler Inhale 2 puffs into the lungs every 6 (six) hours as needed for wheezing or shortness of breath.  Marland Kitchen atorvastatin (LIPITOR) 40 MG tablet Take 40 mg by mouth daily.  . Biotin 1000 MCG tablet Take 1 tablet (1 mg total) by mouth daily.  . Cholecalciferol (VITAMIN D3) 25 MCG (1000 UT) CAPS Take 1 capsule (1,000 Units total) by mouth daily.  . cycloSPORINE (RESTASIS OP) Apply to eye.  . folic acid (FOLVITE) 1 MG tablet Take 1 tablet (1 mg total) by mouth daily.  Marland Kitchen gabapentin (NEURONTIN) 300 MG capsule Take 300 mg by mouth at bedtime.   . inFLIXimab (REMICADE IV) Inject 100 mg into the vein every 6 (six) weeks.  . irbesartan (AVAPRO) 300 MG tablet Take 1 tablet (300 mg total) by mouth daily.  . Magnesium 250 MG TABS Take 1 tablet (250 mg total) by mouth daily.  . pantoprazole (PROTONIX) 40 MG tablet Take 1 tablet (40 mg total) by mouth daily.  . sertraline (ZOLOFT) 25 MG tablet  Take 25 mg by mouth daily.  Marland Kitchen VITAMIN A PO Take 2,400 mcg by mouth daily.   . vitamin B-12 (CYANOCOBALAMIN) 1000 MCG tablet Take 1 tablet (1,000 mcg total) by mouth daily.  Marland Kitchen inFLIXimab in sodium chloride 0.9 % Inject into the vein. Pt receives this infusion every 6 weeks  . Methotrexate Sodium (METHOTREXATE, PF,) 50 MG/2ML injection INJECT 1 ML ONCE WEEKLY. (25 mg)  DISCARD VIAL AFTER USE   No facility-administered encounter medications on file as of 12/21/2019.    Functional Status:  In your present state of health, do you have any difficulty performing the following activities: 03/04/2019  Hearing? N  Vision? Y  Comment recent cataract surgery, near vision is bad  Difficulty concentrating or making decisions? Y  Comment forgetfull at times  Walking or climbing stairs? N  Comment takes time  Dressing or bathing? N  Doing errands, shopping? N  Preparing Food and eating ? N  Using the Toilet? N  Do you have problems with loss of bowel control? N  Managing your Medications? N  Managing your Finances? N  Housekeeping or managing your Housekeeping? N  Some recent data might be hidden    Fall/Depression Screening: Fall Risk  12/21/2019 08/24/2019 06/02/2019  Falls in the past year? 0 0 0  Number falls in past yr: 0 - -  Injury with Fall? 0 - -  Risk for fall due to :  Impaired balance/gait;Impaired mobility;Orthopedic patient Medication side effect;Impaired mobility;Impaired balance/gait Medication side effect;Impaired balance/gait;Impaired mobility  Follow up Falls evaluation completed;Education provided;Falls prevention discussed Falls evaluation completed;Education provided;Falls prevention discussed Falls evaluation completed;Education provided;Falls prevention discussed   PHQ 2/9 Scores 09/30/2019 03/04/2019 02/05/2019  PHQ - 2 Score 0 0 1  PHQ- 9 Score 3 - -   [facility] CM Care Plan Problem One     Most Recent Value  Care Plan Problem One  Knowledge deficiet related to diagnosis of COPD  and self care management.  Role Documenting the Problem One  [place] for Problem One  Active  [facility] Long Term Goal   Patient will report no emergency room visits or unplanned hospitalizations within the next 90 days.  [facility] Long Term Goal Start Date  12/21/19  [facility] Long Term Goal Met Date  12/21/19  Interventions for Problem One Long Term Goal  Goals and care plan reviewed and discussed, pain management reviewed and discussed, EMMI Sciatica Discharge Instructions sent, fall precautions and prevnetions reviewed and discussed, reviewed medications and encourged medication compliance, reviewed COPD action plan, encouraged to keep and attend scheduled medical appointments, EMMI Sciatica exercises      Appointments:  Last attended appointment with primary care provider, Dr. Drema [name] on 11/06/2019 and states she plans to schedule follow up soon.  Asked patient about, Dr. Wynn [name] with [facility] and patient states she plans to stay with Dr. Drema [name] as primary care provider at this time.  Has neurosurgeon appointment within the next few weeks to discuss back pain.  Plan: RN Health Coach will send primary care provider quarterly update. RN Health Coach will make next telephone outreach to patient within the month of August and patient agrees to future outreach. RN Health Coach will send patient EMMI Sciatica Discharge Instructions.  RN Health Coach will send patient EMMI Sciatica Exercises.  [place] Coach [phone] [name]

## 2019-12-22 ENCOUNTER — Ambulatory Visit: Payer: Medicare Other | Admitting: Podiatry

## 2019-12-29 DIAGNOSIS — M4726 Other spondylosis with radiculopathy, lumbar region: Secondary | ICD-10-CM | POA: Diagnosis not present

## 2019-12-29 DIAGNOSIS — M545 Low back pain: Secondary | ICD-10-CM | POA: Diagnosis not present

## 2019-12-30 ENCOUNTER — Ambulatory Visit (INDEPENDENT_AMBULATORY_CARE_PROVIDER_SITE_OTHER): Payer: Medicare Other | Admitting: Family Medicine

## 2019-12-30 ENCOUNTER — Other Ambulatory Visit: Payer: Self-pay

## 2019-12-30 ENCOUNTER — Encounter (INDEPENDENT_AMBULATORY_CARE_PROVIDER_SITE_OTHER): Payer: Self-pay | Admitting: Family Medicine

## 2019-12-30 VITALS — BP 172/80 | HR 69 | Temp 97.8°F | Ht 62.0 in | Wt 184.0 lb

## 2019-12-30 DIAGNOSIS — E669 Obesity, unspecified: Secondary | ICD-10-CM | POA: Diagnosis not present

## 2019-12-30 DIAGNOSIS — I10 Essential (primary) hypertension: Secondary | ICD-10-CM

## 2019-12-30 DIAGNOSIS — R7303 Prediabetes: Secondary | ICD-10-CM

## 2019-12-30 DIAGNOSIS — Z6833 Body mass index (BMI) 33.0-33.9, adult: Secondary | ICD-10-CM | POA: Diagnosis not present

## 2019-12-31 NOTE — Progress Notes (Signed)
Chief Complaint:   OBESITY Joy [name] is here to discuss her progress with her obesity treatment plan along with follow-up of her obesity related diagnoses. Joy [name] is on the Category 2 Plan and states she is following her eating plan approximately 50% of the time. Joy [name] states she is walking and doing stairs for 30-45 minutes 3-4 times per week.  Today's visit was #: 6 Starting weight: 189 lbs Starting date: 09/30/2019 Today's weight: 184 lbs Today's date: 12/30/2019 Total lbs lost to date: 5 lbs Total lbs lost since last in-office visit: 0  Interim History: Joy [name] went to [place] for a wedding over the last 3 weeks.  She felt the drive really wore her out.  She has still been indulging in sweets after meals.  She says she does not think she has gotten in enough vegetables or protein at dinner.  Subjective:   1. Essential hypertension Blood pressure is elevated today, but Joy [name] is in significant pain.  No chest pain, chest pressure, or headache.  Blood pressure previously controlled.   BP Readings from Last 3 Encounters:  12/30/19 (!) 172/80  12/01/19 134/80  11/27/19 132/73   2. Prediabetes Joy [name] has a diagnosis of prediabetes based on her elevated HgA1c and was informed this puts her at greater risk of developing diabetes. She continues to work on diet and exercise to decrease her risk of diabetes. She denies nausea or hypoglycemia.  She is not on metformin.  Lab Results  Component Value Date   HGBA1C 5.9 (H) 09/30/2019   Lab Results  Component Value Date   INSULIN 13.8 09/30/2019   Assessment/Plan:   1. Essential hypertension Joy [name] is working on healthy weight loss and exercise to improve blood pressure control. We will watch for signs of hypotension as she continues her lifestyle modifications.  Continue current medications.  No change in dose.  2. Prediabetes Joy [name] will continue to work on weight loss, exercise, and decreasing simple carbohydrates to  help decrease the risk of diabetes.  Will repeat labs in 1 month.   3. Class 1 obesity with serious comorbidity and body mass index (BMI) of 33.0 to 33.9 in adult, unspecified obesity type Joy [name] is currently in the action stage of change. As such, her goal is to continue with weight loss efforts. She has agreed to the Category 2 Plan.   Exercise goals: As is.  Behavioral modification strategies: increasing lean protein intake, increasing vegetables, meal planning and cooking strategies and keeping healthy foods in the home.  Joy [name] has agreed to follow-up with our clinic in 2 weeks. She was informed of the importance of frequent follow-up visits to maximize her success with intensive lifestyle modifications for her multiple health conditions.   Objective:   Blood pressure (!) 172/80, pulse 69, temperature 97.8 F (36.6 C), temperature source Oral, height 5\' 2"  (1.575 m), weight 184 lb (83.5 kg), SpO2 98 %. Body mass index is 33.65 kg/m.  General: Cooperative, alert, well developed, in no acute distress. HEENT: Conjunctivae and lids unremarkable. Cardiovascular: Regular rhythm.  Lungs: Normal work of breathing. Neurologic: No focal deficits.   Lab Results  Component Value Date   CREATININE 0.82 09/30/2019   BUN 17 09/30/2019   NA 140 09/30/2019   K 4.2 09/30/2019   CL 105 09/30/2019   CO2 22 09/30/2019   Lab Results  Component Value Date   ALT 10 09/30/2019   AST 18 09/30/2019   ALKPHOS 72 09/30/2019   BILITOT 0.5 09/30/2019   Lab  Results  Component Value Date   HGBA1C 5.9 (H) 09/30/2019   Lab Results  Component Value Date   INSULIN 13.8 09/30/2019   Lab Results  Component Value Date   TSH 1.700 09/30/2019   Lab Results  Component Value Date   WBC 5.2 09/30/2019   HGB 13.8 09/30/2019   HCT 42.0 09/30/2019   MCV 96 09/30/2019   PLT 262 09/30/2019   Attestation Statements:   Reviewed by clinician on day of visit: allergies, medications, problem list,  medical history, surgical history, family history, social history, and previous encounter notes.  Time spent on visit including pre-visit chart review and post-visit care and charting was 15 minutes.   I, Insurance claims handler, CMA, am acting as transcriptionist for Reuben Likes, MD.  I have reviewed the above documentation for accuracy and completeness, and I agree with the above. - Katherina Mires, MD

## 2020-01-01 ENCOUNTER — Ambulatory Visit: Payer: Medicare Other | Admitting: *Deleted

## 2020-01-04 ENCOUNTER — Ambulatory Visit: Payer: Medicare Other | Admitting: Pharmacist

## 2020-01-05 DIAGNOSIS — Z6834 Body mass index (BMI) 34.0-34.9, adult: Secondary | ICD-10-CM | POA: Diagnosis not present

## 2020-01-05 DIAGNOSIS — M4316 Spondylolisthesis, lumbar region: Secondary | ICD-10-CM | POA: Diagnosis not present

## 2020-01-05 DIAGNOSIS — M48062 Spinal stenosis, lumbar region with neurogenic claudication: Secondary | ICD-10-CM | POA: Diagnosis not present

## 2020-01-05 DIAGNOSIS — M5126 Other intervertebral disc displacement, lumbar region: Secondary | ICD-10-CM | POA: Diagnosis not present

## 2020-01-05 DIAGNOSIS — M4150 Other secondary scoliosis, site unspecified: Secondary | ICD-10-CM | POA: Diagnosis not present

## 2020-01-05 DIAGNOSIS — I10 Essential (primary) hypertension: Secondary | ICD-10-CM | POA: Diagnosis not present

## 2020-01-06 ENCOUNTER — Other Ambulatory Visit: Payer: Self-pay | Admitting: Neurosurgery

## 2020-01-08 ENCOUNTER — Ambulatory Visit (INDEPENDENT_AMBULATORY_CARE_PROVIDER_SITE_OTHER): Payer: Medicare Other | Admitting: Podiatry

## 2020-01-08 ENCOUNTER — Other Ambulatory Visit: Payer: Self-pay

## 2020-01-08 ENCOUNTER — Ambulatory Visit (INDEPENDENT_AMBULATORY_CARE_PROVIDER_SITE_OTHER): Payer: Medicare Other

## 2020-01-08 DIAGNOSIS — M79676 Pain in unspecified toe(s): Secondary | ICD-10-CM | POA: Diagnosis not present

## 2020-01-08 DIAGNOSIS — M2042 Other hammer toe(s) (acquired), left foot: Secondary | ICD-10-CM

## 2020-01-08 DIAGNOSIS — M2011 Hallux valgus (acquired), right foot: Secondary | ICD-10-CM

## 2020-01-08 DIAGNOSIS — M2012 Hallux valgus (acquired), left foot: Secondary | ICD-10-CM

## 2020-01-08 DIAGNOSIS — M2041 Other hammer toe(s) (acquired), right foot: Secondary | ICD-10-CM

## 2020-01-08 DIAGNOSIS — M79671 Pain in right foot: Secondary | ICD-10-CM

## 2020-01-08 DIAGNOSIS — M79672 Pain in left foot: Secondary | ICD-10-CM

## 2020-01-08 DIAGNOSIS — L600 Ingrowing nail: Secondary | ICD-10-CM

## 2020-01-09 ENCOUNTER — Other Ambulatory Visit: Payer: Self-pay

## 2020-01-09 ENCOUNTER — Encounter (HOSPITAL_BASED_OUTPATIENT_CLINIC_OR_DEPARTMENT_OTHER): Payer: Self-pay | Admitting: Emergency Medicine

## 2020-01-09 ENCOUNTER — Emergency Department (HOSPITAL_BASED_OUTPATIENT_CLINIC_OR_DEPARTMENT_OTHER)
Admission: EM | Admit: 2020-01-09 | Discharge: 2020-01-09 | Disposition: A | Discharge status: Home / Self Care | Payer: Medicare Other | Attending: Emergency Medicine | Admitting: Emergency Medicine

## 2020-01-09 DIAGNOSIS — Z91040 Latex allergy status: Secondary | ICD-10-CM | POA: Diagnosis not present

## 2020-01-09 DIAGNOSIS — M5489 Other dorsalgia: Secondary | ICD-10-CM | POA: Diagnosis present

## 2020-01-09 DIAGNOSIS — Z79899 Other long term (current) drug therapy: Secondary | ICD-10-CM | POA: Insufficient documentation

## 2020-01-09 DIAGNOSIS — Z87891 Personal history of nicotine dependence: Secondary | ICD-10-CM | POA: Diagnosis not present

## 2020-01-09 DIAGNOSIS — M545 Low back pain, unspecified: Secondary | ICD-10-CM

## 2020-01-09 DIAGNOSIS — I10 Essential (primary) hypertension: Secondary | ICD-10-CM | POA: Diagnosis not present

## 2020-01-09 MED ORDER — HYDROCODONE-ACETAMINOPHEN 5-325 MG PO TABS: 1.0000 | ORAL_TABLET | Freq: Four times a day (QID) | ORAL | 0 refills | Status: AC | PRN

## 2020-01-09 MED ORDER — DIAZEPAM 2 MG PO TABS
2.0000 mg | ORAL_TABLET | Freq: Once | ORAL | Status: AC
  Administered 2020-01-09: 2 mg via ORAL
  Filled 2020-01-09: qty 1

## 2020-01-09 MED ORDER — OXYCODONE-ACETAMINOPHEN 5-325 MG PO TABS
1.0000 | ORAL_TABLET | Freq: Once | ORAL | Status: AC
  Administered 2020-01-09: 1 via ORAL
  Filled 2020-01-09: qty 1

## 2020-01-09 NOTE — Discharge Instructions (Addendum)
Please call Dr. Jackelyn Knife office on Monday to discuss your symptoms and discuss close follow-up appointment and timing of surgery.  If you develop worsening pain, numbness, weakness, incontinence or other new concerning symptom, return to ER for reassessment.  Take the prescribed pain meds as needed, note this can make you drowsy and should not be taken while driving or operating heavy machinery.

## 2020-01-09 NOTE — ED Triage Notes (Signed)
L low back pain. Has a herniated disc and is scheduled for surgery. She has been taking hydrocodone and aleve without relief.

## 2020-01-10 NOTE — ED Provider Notes (Signed)
[place] EMERGENCY DEPARTMENT Provider Note   CSN: [id number] Arrival date & time: 01/09/20  1138     History Chief Complaint  Patient presents with  . Back Pain    Joy [name] is a 79 y.o. female.  Presented to ER with acute on chronic low back pain.  Reports that she is followed by Dr. Reuel Boom, has had 2 different back surgeries.  Currently scheduled for surgery June for lumbar fusion.  Reports that over the past couple weeks she has had worsening of her low back pain, radiates down both legs, worse on left.  No new bladder or bowel incontinence, no urinary retention, no saddle anesthesia, numbness or weakness in lower extremities.  No fever, no recent trauma.  Has been taking prescribed hydrocodone for pain, has had minimal relief.  Reports she is almost out of her hydrocodone and was unable to get this refilled over the weekend by her primary neurosurgeon.  MRI - 12/29/19  Since the last MRI, there has been extension of the fusion through the L2-3 level. There appears to be sufficient decompression of the canal at this level. Foramina are poorly seen because of artifact related to the pedicle screws. No endplate edema. I cannot definitely establish bony union based on the MRI, but there is no definite sign of ongoing motion at this level.  Development of adjacent segment degenerative changes at L1-2. Disc degeneration with discogenic edema affecting the L1 and L2 vertebral bodies. Broad-based disc herniation with caudal migration of disc material behind L2. Severe stenosis at this level likely to cause neural compression.   HPI     Past Medical History:  Diagnosis Date  . Ankylosing spondylitis ([place])   . Anxiety   . Arthritis    RHEUMATOID  . Back pain   . Bronchitis   . Constipation   . COPD (chronic obstructive pulmonary disease) ([place])    CXR 01/11/18 showed mild COPD and chronic bronchitis  . CTS (carpal tunnel syndrome)   . Dry eye   . Dry mouth    . Dysrhythmia    "irregularity" unknown at this time - being evaluated by cardiology  . Essential hypertension 11/24/2015  . GERD (gastroesophageal reflux disease)   . HLA B27 (HLA B27 positive)   . Hypercholesteremia   . Hyperlipidemia 11/24/2015  . Hypertension   . IBS (irritable bowel syndrome)   . Joint pain   . Neuropathy   . OAB (overactive bladder)   . Obesity   . Osteoarthritis   . Osteoporosis   . RA (rheumatoid arthritis) ([place])   . Rheumatoid arthritis ([place]) 11/24/2015  . Sciatica   . Seasonal allergies   . Spinal stenosis     Patient Active Problem List   Diagnosis Date Noted  . Spinal stenosis of lumbar region 03/04/2019  . 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 Acute blood loss as cause of postoperative anemia 06/21/2018  . 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 Spondylosis 06/13/2018  . Status post lumbar spinal fusion 06/13/2018  . Status post lumbar spine surgery for decompression of spinal cord 06/13/2018  . 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 History of total knee arthroplasty, right 09/20/2017  . OA (osteoarthritis) of knee 12/26/2015  . Essential hypertension 11/24/2015  . Hyperlipidemia 11/24/2015  . Rheumatoid arthritis ([place]) 11/24/2015  . Lumbar spondylosis 07/21/2014    Past Surgical History:  Procedure Laterality Date  . ABDOMINAL HYSTERECTOMY  1995  . BACK SURGERY    . BREAST SURGERY     REDUCTION  . CATARACT EXTRACTION W/PHACO  08/01/2012   Procedure: CATARACT EXTRACTION PHACO AND INTRAOCULAR LENS PLACEMENT (IOC);  Surgeon: Marylynn Pearson, MD;  Location: [facility];  Service: Ophthalmology;  Laterality: Right;  . HERNIA REPAIR     RIGHT ING.  . JOINT REPLACEMENT  2014   rt total knee  . TONSILLECTOMY    . TOTAL KNEE ARTHROPLASTY Left 12/26/2015   Procedure: TOTAL KNEE ARTHROPLASTY;  Surgeon: Gaynelle Arabian, MD;  Location: [facility];  Service:  Orthopedics;  Laterality: Left;     OB History    Gravida  1   Para      Term      Preterm      AB      Living        SAB      TAB      Ectopic      Multiple      Live Births              Family History  Problem Relation Age of Onset  . Heart disease Mother   . Cerebral aneurysm Mother   . Hypertension Mother   . Obesity Mother   . Heart attack Father   . Heart disease Father   . Hypertension Father   . Hypertension Sister   . Arthritis Sister   . Stroke Sister   . Hypertension Sister   . Arthritis Sister   . Hypertension Sister   . Arthritis Sister   . Hypertension Sister   . Hypertension Sister     Social History   Tobacco Use  . Smoking status: Former Smoker    Packs/day: 1.00    Years: 35.00    Pack years: 35.00    Quit date: 05/29/1995    Years since quitting: 24.6  . Smokeless tobacco: Never Used  Substance Use Topics  . Alcohol use: Yes    Comment: rare  . Drug use: No    Home Medications Prior to Admission medications   Medication Sig Start Date End Date Taking? Authorizing Provider  albuterol (VENTOLIN HFA) 108 (90 Base) MCG/ACT inhaler Inhale 2 puffs into the lungs every 6 (six) hours as needed for wheezing or shortness of breath. 02/12/19   Lauraine Rinne, NP  atorvastatin (LIPITOR) 40 MG tablet Take 40 mg by mouth daily.    [provider]  Biotin 1000 MCG tablet Take 1 tablet (1 mg total) by mouth daily. 07/09/18   Hennie Duos, MD  Cholecalciferol (VITAMIN D3) 25 MCG (1000 UT) CAPS Take 1 capsule (1,000 Units total) by mouth daily. 07/09/18   Hennie Duos, MD  cycloSPORINE (RESTASIS OP) Apply to eye.    [provider]  folic acid (FOLVITE) 1 MG tablet Take 1 tablet (1 mg total) by mouth daily. 07/09/18   Hennie Duos, MD  gabapentin (NEURONTIN) 300 MG capsule Take 300 mg by mouth at bedtime.  10/27/15   [provider]  HYDROcodone-acetaminophen (NORCO/VICODIN) 5-325 MG tablet Take 1  tablet by mouth every 6 (six) hours as needed for up to 3 days for moderate pain. 01/09/20 01/12/20  Lucrezia Starch, MD  ID NOW COVID-19 KIT See admin instructions. for testing 12/18/19   [provider]  inFLIXimab (REMICADE IV) Inject 100 mg into the vein every 6 (six) weeks.    [provider]  inFLIXimab in sodium chloride 0.9 %  Inject into the vein. Pt receives this infusion every 6 weeks    [provider]  irbesartan (AVAPRO) 300 MG tablet Take 1 tablet (300 mg total) by mouth daily. 07/09/18   Hennie Duos, MD  Magnesium 250 MG TABS Take 1 tablet (250 mg total) by mouth daily. 07/09/18   Hennie Duos, MD  Methotrexate Sodium (METHOTREXATE, PF,) 50 MG/2ML injection INJECT 1 ML ONCE WEEKLY. (25 mg)  DISCARD VIAL AFTER USE 11/13/18   [provider]  pantoprazole (PROTONIX) 40 MG tablet Take 1 tablet (40 mg total) by mouth daily. 07/09/18   Hennie Duos, MD  sertraline (ZOLOFT) 25 MG tablet Take 25 mg by mouth daily.    [provider]  VITAMIN A PO Take 2,400 mcg by mouth daily.     [provider]  vitamin B-12 (CYANOCOBALAMIN) 1000 MCG tablet Take 1 tablet (1,000 mcg total) by mouth daily. 07/09/18   Hennie Duos, MD    Allergies    Codeine, Tape, Latex, and Nickel  Review of Systems   Review of Systems  Constitutional: Negative for chills and fever.  HENT: Negative for ear pain and sore throat.   Eyes: Negative for pain and visual disturbance.  Respiratory: Negative for cough and shortness of breath.   Cardiovascular: Negative for chest pain and palpitations.  Gastrointestinal: Negative for abdominal pain and vomiting.  Genitourinary: Negative for dysuria and hematuria.  Musculoskeletal: Positive for back pain. Negative for arthralgias.  Skin: Negative for color change and rash.  Neurological: Negative for seizures and syncope.  All other systems reviewed and are negative.   Physical Exam Updated Vital  Signs BP (!) 137/111 (BP Location: Left Arm)   Pulse 74   Temp 98.3 F (36.8 C) (Oral)   Resp 16   Ht 5' 2"  (1.575 m)   Wt 83.5 kg   SpO2 100%   BMI 33.65 kg/m   Physical Exam Vitals and nursing note reviewed.  Constitutional:      General: She is not in acute distress.    Appearance: She is well-developed.  HENT:     Head: Normocephalic and atraumatic.  Eyes:     Conjunctiva/sclera: Conjunctivae normal.  Cardiovascular:     Rate and Rhythm: Normal rate and regular rhythm.     Heart sounds: No murmur.  Pulmonary:     Effort: Pulmonary effort is normal. No respiratory distress.     Breath sounds: Normal breath sounds.  Abdominal:     Palpations: Abdomen is soft.     Tenderness: There is no abdominal tenderness.  Musculoskeletal:     Cervical back: Neck supple.     Comments: No midline C, T, L-spine tenderness  Skin:    General: Skin is warm and dry.  Neurological:     Mental Status: She is alert.     Comments: Alert, oriented x3, 5 out of 5 strength in bilateral lower extremities, sensation to light touch intact in bilateral lower extremities     ED Results / Procedures / Treatments   Labs (all labs ordered are listed, but only abnormal results are displayed) Labs Reviewed - No data to display  EKG None  Radiology No results found.  Procedures Procedures (including critical care time)  Medications Ordered in ED Medications  oxyCODONE-acetaminophen (PERCOCET/ROXICET) 5-325 MG per tablet 1 tablet (1 tablet Oral Given 01/09/20 1356)  diazepam (VALIUM) tablet 2 mg (2 mg Oral Given 01/09/20 1356)    ED Course  I have reviewed  the triage vital signs and the nursing notes.  Pertinent labs & imaging results that were available during my care of the patient were reviewed by me and considered in my medical decision making (see chart for details).    MDM Rules/Calculators/A&P                      79 year old lady presenting to ER with acute on chronic back  pain, history of chronic back pain, extensive lumbar spine degenerative disease, disc herniation.  Planning for lumbar spine surgery in June with [facility].  Reports difficulty with pain control today.  On exam she is well-appearing, neuro intact.  At this time do not see indication for repeat spine imaging.  Offered trial of IV analgesia or p.o. analgesia.  Patient symptoms very well controlled after receiving single dose of Valium and Percocet. Amb without difficulty. Reports being out of her regular hydrocodone.  Will provide short Rx refill until she can get close follow-up with neurosurgery.  Given her ongoing symptoms, strongly recommended close recheck with neurosurgery early this week.    After the discussed management above, the patient was determined to be safe for discharge.  The patient was in agreement with this plan and all questions regarding their care were answered.  ED return precautions were discussed and the patient will return to the ED with any significant worsening of condition.    Final Clinical Impression(s) / ED Diagnoses Final diagnoses:  Low back pain, unspecified back pain laterality, unspecified chronicity, unspecified whether sciatica present    Rx / DC Orders ED Discharge Orders         Ordered    HYDROcodone-acetaminophen (NORCO/VICODIN) 5-325 MG tablet  Every 6 hours PRN     01/09/20 1433           Lucrezia Starch, MD 01/10/20 0725

## 2020-01-10 NOTE — Progress Notes (Signed)
  Subjective:  Patient ID: Joy [name], female    DOB: [birth year],  MRN: [id number]  Chief Complaint  Patient presents with  . Nail Problem    Left 1st digit medial border painful "I think it is ingrown" 3 years duration.  . Foot Problem    Left 1st digit "Drifting over into my 2nd/3rd toes"  . Bunions    Left foot bunion  . Peripheral Neuropathy    Pt states neuropathy pain, 300mg  Gabapentin does not help.    79 y.o. female presents with the above complaint. History confirmed with patient.   Objective:  Physical Exam: warm, good capillary refill, no trophic changes or ulcerative lesions, normal DP and PT pulses and normal sensory exam. Left Foot: Pain palpation about the left hallux with hallux valgus deformity and hammertoe deformity  No images are attached to the encounter.  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 Hammertoes of both feet   3. Ingrown nail   4. Pain around toenail      Plan:  Patient was evaluated and treated and all questions answered.  Hallux Valgus, hammertoes -XR reviewed with patient -Educated on etiology of deformity -Discussed padding and shoe gear changes -Dispensed toe spacers  -Dispensed to perform bunion shield  Ingrown nail -Gently debrided  No follow-ups on file.

## 2020-01-12 ENCOUNTER — Other Ambulatory Visit: Payer: Self-pay | Admitting: Podiatry

## 2020-01-12 DIAGNOSIS — M2041 Other hammer toe(s) (acquired), right foot: Secondary | ICD-10-CM

## 2020-01-12 DIAGNOSIS — M2042 Other hammer toe(s) (acquired), left foot: Secondary | ICD-10-CM

## 2020-01-13 DIAGNOSIS — M0589 Other rheumatoid arthritis with rheumatoid factor of multiple sites: Secondary | ICD-10-CM | POA: Diagnosis not present

## 2020-01-14 ENCOUNTER — Ambulatory Visit (INDEPENDENT_AMBULATORY_CARE_PROVIDER_SITE_OTHER): Payer: Medicare Other | Admitting: Family Medicine

## 2020-01-14 ENCOUNTER — Encounter (INDEPENDENT_AMBULATORY_CARE_PROVIDER_SITE_OTHER): Payer: Self-pay | Admitting: Family Medicine

## 2020-01-14 ENCOUNTER — Other Ambulatory Visit: Payer: Self-pay

## 2020-01-14 VITALS — BP 166/85 | HR 74 | Temp 97.8°F | Ht 62.0 in | Wt 186.0 lb

## 2020-01-14 DIAGNOSIS — I10 Essential (primary) hypertension: Secondary | ICD-10-CM | POA: Diagnosis not present

## 2020-01-14 DIAGNOSIS — Z6834 Body mass index (BMI) 34.0-34.9, adult: Secondary | ICD-10-CM | POA: Diagnosis not present

## 2020-01-14 DIAGNOSIS — R7303 Prediabetes: Secondary | ICD-10-CM

## 2020-01-14 DIAGNOSIS — E669 Obesity, unspecified: Secondary | ICD-10-CM | POA: Diagnosis not present

## 2020-01-14 NOTE — Progress Notes (Signed)
Chief Complaint:   OBESITY Joy [name] is here to discuss her progress with her obesity treatment plan along with follow-up of her obesity related diagnoses. Joy [name] is on the Category 2 Plan and states she is following her eating plan approximately 98% of the time. Joy [name] states she is exercising for 0 minutes 0 times per week.  Today's visit was #: 7 Starting weight: 189 lbs Starting date: 09/30/2019 Today's weight: 186 lbs Today's date: 01/14/2020 Total lbs lost to date: 3 lbs Total lbs lost since last in-office visit: 0  Interim History: Joy [name] says she has really been struggling with back pain over the last few weeks.  She had to go to the ED for uncontrolled pain.  She did some comfort eating with oatmeal and some carbohydrates.  She realizes she likely needs more vegetables in her diet.  She has surgery coming up on 02/03/2020.  Subjective:   1. Prediabetes Joy [name] has a diagnosis of prediabetes based on her elevated HgA1c and was informed this puts her at greater risk of developing diabetes. She continues to work on diet and exercise to decrease her risk of diabetes. She denies nausea or hypoglycemia.  She endorses carb cravings with increase in pain.  She is not on medication.  Lab Results  Component Value Date   HGBA1C 5.9 (H) 09/30/2019   Lab Results  Component Value Date   INSULIN 13.8 09/30/2019   2. Essential hypertension Review: taking medications as instructed, no medication side effects noted, no chest pain on exertion, no dyspnea on exertion, no swelling of ankles.  Blood pressure is fairly elevated today, but Joy [name] is in a significant amount of pain due to a herniated disc in her back.  First measurement was 166/85.  BP Readings from Last 3 Encounters:  01/14/20 (!) 166/85  01/09/20 (!) 137/111  12/30/19 (!) 172/80   Assessment/Plan:   1. Prediabetes Joy [name] will continue to work on weight loss, exercise, and decreasing simple carbohydrates to help  decrease the risk of diabetes. Follow-up labs after surgery.  2. Essential hypertension Joy [name] is working on healthy weight loss and exercise to improve blood pressure control. We will watch for signs of hypotension as she continues her lifestyle modifications.  Follow-up at next appointment.  She sees Dr. Oval Linsey at [facility].  3. Class 1 obesity with serious comorbidity and body mass index (BMI) of 34.0 to 34.9 in adult, unspecified obesity type Joy [name] is currently in the action stage of change. As such, her goal is to continue with weight loss efforts. She has agreed to the Category 2 Plan.   Exercise goals: No exercise has been prescribed at this time.  Behavioral modification strategies: increasing lean protein intake, meal planning and cooking strategies, better snacking choices and emotional eating strategies.  Joy [name] has agreed to follow-up with our clinic in 2 weeks. She was informed of the importance of frequent follow-up visits to maximize her success with intensive lifestyle modifications for her multiple health conditions.   Objective:   Blood pressure (!) 166/85, pulse 74, temperature 97.8 F (36.6 C), temperature source Oral, height 5\' 2"  (1.575 m), weight 186 lb (84.4 kg), SpO2 99 %. Body mass index is 34.02 kg/m.  General: Cooperative, alert, well developed, in no acute distress. HEENT: Conjunctivae and lids unremarkable. Cardiovascular: Regular rhythm.  Lungs: Normal work of breathing. Neurologic: No focal deficits.   Lab Results  Component Value Date   CREATININE 0.82 09/30/2019   BUN 17 09/30/2019   NA 140  09/30/2019   K 4.2 09/30/2019   CL 105 09/30/2019   CO2 22 09/30/2019   Lab Results  Component Value Date   ALT 10 09/30/2019   AST 18 09/30/2019   ALKPHOS 72 09/30/2019   BILITOT 0.5 09/30/2019   Lab Results  Component Value Date   HGBA1C 5.9 (H) 09/30/2019   Lab Results  Component Value Date   INSULIN 13.8 09/30/2019   Lab Results    Component Value Date   TSH 1.700 09/30/2019   Lab Results  Component Value Date   WBC 5.2 09/30/2019   HGB 13.8 09/30/2019   HCT 42.0 09/30/2019   MCV 96 09/30/2019   PLT 262 09/30/2019   Attestation Statements:   Reviewed by clinician on day of visit: allergies, medications, problem list, medical history, surgical history, family history, social history, and previous encounter notes.  Time spent on visit including pre-visit chart review and post-visit care and charting was 15 minutes.   I, Insurance claims handler, CMA, am acting as transcriptionist for Reuben Likes, MD.  I have reviewed the above documentation for accuracy and completeness, and I agree with the above. - Katherina Mires, MD

## 2020-01-29 DIAGNOSIS — Z1231 Encounter for screening mammogram for malignant neoplasm of breast: Secondary | ICD-10-CM | POA: Diagnosis not present

## 2020-01-29 NOTE — Progress Notes (Signed)
[facility] #[id number] - [place], [place] - [address] AT [facility] [address] [place] [address] Phone: [phone] Fax: [phone]      Your procedure is scheduled on February 03, 2020.  Report to [facility] Main Entrance "A" at 7:00 A.M., and check in at the Admitting office.  Call this number if you have problems the morning of surgery:  [phone]  Call [phone] if you have any questions prior to your surgery date Monday-Friday 8am-4pm    Remember:  Do not eat or drink after midnight the night before your surgery    Take these medicines the morning of surgery with A SIP OF WATER: atorvastatin (LIPITOR) sertraline (ZOLOFT)  albuterol (VENTOLIN HFA) - as needed   As of today, STOP taking any Aspirin (unless otherwise instructed by your surgeon) and Aspirin containing products, Aleve, Naproxen, Ibuprofen, Motrin, Advil, Goody's, BC's, all herbal medications, fish oil, and all vitamins.                      Do not wear jewelry, make up, or nail polish            Do not wear lotions, powders, perfumes or deodorant.            Do not shave 48 hours prior to surgery.              Do not bring valuables to the hospital.            [facility] is not responsible for any belongings or valuables.  Do NOT Smoke (Tobacco/Vapping) or drink Alcohol 24 hours prior to your procedure If you use a CPAP at night, you may bring all equipment for your overnight stay.   Contacts, glasses, dentures or bridgework may not be worn into surgery.      For patients admitted to the hospital, discharge time will be determined by your treatment team.   Patients discharged the day of surgery will not be allowed to drive home, and someone needs to stay with them for 24 hours.    Special instructions:   [place]- Preparing For Surgery  Before surgery, you can play an important role. Because skin is not sterile, your skin needs to be as free of germs as possible. You can  reduce the number of germs on your skin by washing with CHG (chlorahexidine gluconate) Soap before surgery.  CHG is an antiseptic cleaner which kills germs and bonds with the skin to continue killing germs even after washing.    Oral Hygiene is also important to reduce your risk of infection.  Remember - BRUSH YOUR TEETH THE MORNING OF SURGERY WITH YOUR REGULAR TOOTHPASTE  Please do not use if you have an allergy to CHG or antibacterial soaps. If your skin becomes reddened/irritated stop using the CHG.  Do not shave (including legs and underarms) for at least 48 hours prior to first CHG shower. It is OK to shave your face.  Please follow these instructions carefully.   1. Shower the NIGHT BEFORE SURGERY and the MORNING OF SURGERY with CHG Soap.   2. If you chose to wash your hair, wash your hair first as usual with your normal shampoo.  3. After you shampoo, rinse your hair and body thoroughly to remove the shampoo.  4. Use CHG as you would any other liquid soap. You can apply CHG directly to the skin and wash gently with a scrungie or  a clean washcloth.   5. Apply the CHG Soap to your body ONLY FROM THE NECK DOWN.  Do not use on open wounds or open sores. Avoid contact with your eyes, ears, mouth and genitals (private parts). Wash Face and genitals (private parts)  with your normal soap.   6. Wash thoroughly, paying special attention to the area where your surgery will be performed.  7. Thoroughly rinse your body with warm water from the neck down.  8. DO NOT shower/wash with your normal soap after using and rinsing off the CHG Soap.  9. Pat yourself dry with a CLEAN TOWEL.  10. Wear CLEAN PAJAMAS to bed the night before surgery, wear comfortable clothes the morning of surgery  11. Place CLEAN SHEETS on your bed the night of your first shower and DO NOT SLEEP WITH PETS.   Day of Surgery:   Do not apply any deodorants/lotions.  Please wear clean clothes to the hospital/surgery  center.   Remember to brush your teeth WITH YOUR REGULAR TOOTHPASTE.   Please read over the following fact sheets that you were given.

## 2020-02-01 ENCOUNTER — Other Ambulatory Visit (HOSPITAL_COMMUNITY)
Admission: RE | Admit: 2020-02-01 | Discharge: 2020-02-01 | Disposition: A | Discharge status: Home / Self Care | Payer: Medicare Other | Source: Ambulatory Visit | Attending: Neurosurgery | Admitting: Neurosurgery

## 2020-02-01 ENCOUNTER — Encounter (INDEPENDENT_AMBULATORY_CARE_PROVIDER_SITE_OTHER): Payer: Self-pay | Admitting: Adult Health

## 2020-02-01 ENCOUNTER — Encounter (HOSPITAL_COMMUNITY): Payer: Self-pay

## 2020-02-01 ENCOUNTER — Other Ambulatory Visit: Payer: Self-pay

## 2020-02-01 ENCOUNTER — Encounter (HOSPITAL_COMMUNITY)
Admission: RE | Admit: 2020-02-01 | Discharge: 2020-02-01 | Disposition: A | Discharge status: Home / Self Care | Payer: Medicare Other | Source: Ambulatory Visit | Attending: Neurosurgery | Admitting: Neurosurgery

## 2020-02-01 ENCOUNTER — Ambulatory Visit (INDEPENDENT_AMBULATORY_CARE_PROVIDER_SITE_OTHER): Payer: Medicare Other | Admitting: Adult Health

## 2020-02-01 VITALS — BP 140/62 | HR 72 | Temp 98.4°F | Ht 62.0 in | Wt 183.0 lb

## 2020-02-01 DIAGNOSIS — Z79899 Other long term (current) drug therapy: Secondary | ICD-10-CM | POA: Insufficient documentation

## 2020-02-01 DIAGNOSIS — Z20822 Contact with and (suspected) exposure to covid-19: Secondary | ICD-10-CM | POA: Insufficient documentation

## 2020-02-01 DIAGNOSIS — Z6836 Body mass index (BMI) 36.0-36.9, adult: Secondary | ICD-10-CM | POA: Insufficient documentation

## 2020-02-01 DIAGNOSIS — R7303 Prediabetes: Secondary | ICD-10-CM

## 2020-02-01 DIAGNOSIS — Z6833 Body mass index (BMI) 33.0-33.9, adult: Secondary | ICD-10-CM | POA: Diagnosis not present

## 2020-02-01 DIAGNOSIS — I10 Essential (primary) hypertension: Secondary | ICD-10-CM

## 2020-02-01 DIAGNOSIS — Z8249 Family history of ischemic heart disease and other diseases of the circulatory system: Secondary | ICD-10-CM | POA: Insufficient documentation

## 2020-02-01 DIAGNOSIS — E669 Obesity, unspecified: Secondary | ICD-10-CM | POA: Diagnosis not present

## 2020-02-01 DIAGNOSIS — K219 Gastro-esophageal reflux disease without esophagitis: Secondary | ICD-10-CM | POA: Insufficient documentation

## 2020-02-01 DIAGNOSIS — J449 Chronic obstructive pulmonary disease, unspecified: Secondary | ICD-10-CM | POA: Insufficient documentation

## 2020-02-01 DIAGNOSIS — Z01812 Encounter for preprocedural laboratory examination: Secondary | ICD-10-CM | POA: Insufficient documentation

## 2020-02-01 DIAGNOSIS — M48062 Spinal stenosis, lumbar region with neurogenic claudication: Secondary | ICD-10-CM | POA: Insufficient documentation

## 2020-02-01 DIAGNOSIS — E785 Hyperlipidemia, unspecified: Secondary | ICD-10-CM | POA: Insufficient documentation

## 2020-02-01 HISTORY — DX: Prediabetes: R73.03

## 2020-02-01 LAB — BASIC METABOLIC PANEL
Anion gap: 7 (ref 5–15)
BUN: 20 mg/dL (ref 8–23)
CO2: 25 mmol/L (ref 22–32)
Calcium: 9.6 mg/dL (ref 8.9–10.3)
Chloride: 105 mmol/L (ref 98–111)
Creatinine, Ser: 0.82 mg/dL (ref 0.44–1.00)
GFR calc Af Amer: >60 mL/min (ref >60)
GFR calc non Af Amer: >60 mL/min (ref >60)
Glucose, Bld: 96 mg/dL (ref 70–99)
Potassium: 3.9 mmol/L (ref 3.5–5.1)
Sodium: 137 mmol/L (ref 135–145)

## 2020-02-01 LAB — CBC
HCT: 38.5 % (ref 36.0–46.0)
Hemoglobin: 12.4 g/dL (ref 12.0–15.0)
MCH: 31.6 pg (ref 26.0–34.0)
MCHC: 32.2 g/dL (ref 30.0–36.0)
MCV: 98 fL (ref 80.0–100.0)
Platelets: 265 10*3/uL (ref 150–400)
RBC: 3.93 MIL/uL (ref 3.87–5.11)
RDW: 13.9 % (ref 11.5–15.5)
WBC: 7.6 10*3/uL (ref 4.0–10.5)
nRBC: 0 % (ref 0.0–0.2)

## 2020-02-01 LAB — SURGICAL PCR SCREEN
MRSA, PCR: NEGATIVE
Staphylococcus aureus: NEGATIVE

## 2020-02-01 LAB — GLUCOSE, CAPILLARY: Glucose-Capillary: 97 mg/dL (ref 70–99)

## 2020-02-01 LAB — HEMOGLOBIN A1C
Hgb A1c MFr Bld: 6.2 % — ABNORMAL HIGH (ref 4.8–5.6)
Mean Plasma Glucose: 131.24 mg/dL

## 2020-02-01 LAB — SARS CORONAVIRUS 2 (TAT 6-24 HRS): SARS Coronavirus 2: NEGATIVE

## 2020-02-01 NOTE — Progress Notes (Signed)
[facility] [place], [place] AT [place] [address] Phone: [phone] Fax: [phone]      Your procedure is scheduled on February 03, 2020.  Report to [facility] Main Entrance "A" at 7:00 A.M., and check in at the Admitting office.  Call this number if you have problems the morning of surgery:  [phone]  Call [phone] if you have any questions prior to your surgery date Monday-Friday 8am-4pm    Remember:  Do not eat or drink after midnight the night before your surgery.    Take these medicines the morning of surgery with A SIP OF WATER: atorvastatin (LIPITOR) sertraline (ZOLOFT)  pantoprazole (PROTONIX) oxyCODONE (OXY IR/ROXICODONE) - as needed Propylene Glycol (SYSTANE BALANCE) eye drops as needed albuterol (VENTOLIN HFA) - as needed  Please bring all inhalers with you the day of surgery.    As of today, STOP taking any Aspirin (unless otherwise instructed by your surgeon) and Aspirin containing products, Aleve, Naproxen, Ibuprofen, Motrin, Advil, Goody's, BC's, all herbal medications, fish oil, and all vitamins.          The Morning of Surgery:            Do not wear jewelry, make up, or nail polish.            Do not wear lotions, powders, perfumes or deodorant.            Do not shave 48 hours prior to surgery.              Do not bring valuables to the hospital.            [facility] is not responsible for any belongings or valuables.  Do NOT Smoke (Tobacco/Vapping) or drink Alcohol 24 hours prior to your procedure.  If you use a CPAP at night, you may bring all equipment for your overnight stay.   Contacts, glasses, dentures or bridgework may not be worn into surgery.      For patients admitted to the hospital, discharge time will be determined by your treatment team.   Patients discharged the day of surgery will not be allowed to drive home, and someone needs to stay with them for 24  hours.    Special instructions:   [place]- Preparing For Surgery  Before surgery, you can play an important role. Because skin is not sterile, your skin needs to be as free of germs as possible. You can reduce the number of germs on your skin by washing with CHG (chlorahexidine gluconate) Soap before surgery.  CHG is an antiseptic cleaner which kills germs and bonds with the skin to continue killing germs even after washing.    Oral Hygiene is also important to reduce your risk of infection.  Remember - BRUSH YOUR TEETH THE MORNING OF SURGERY WITH YOUR REGULAR TOOTHPASTE  Please do not use if you have an allergy to CHG or antibacterial soaps. If your skin becomes reddened/irritated stop using the CHG.  Do not shave (including legs and underarms) for at least 48 hours prior to first CHG shower. It is OK to shave your face.  Please follow these instructions carefully.   1. Shower the NIGHT BEFORE SURGERY and the MORNING OF SURGERY with CHG Soap.   2. If you chose to wash your hair, wash your hair first as usual with your normal shampoo.  3. After you shampoo, rinse your hair and  body thoroughly to remove the shampoo.  4. Use CHG as you would any other liquid soap. You can apply CHG directly to the skin and wash gently with a scrungie or a clean washcloth.   5. Apply the CHG Soap to your body ONLY FROM THE NECK DOWN.  Do not use on open wounds or open sores. Avoid contact with your eyes, ears, mouth and genitals (private parts). Wash Face and genitals (private parts)  with your normal soap.   6. Wash thoroughly, paying special attention to the area where your surgery will be performed.  7. Thoroughly rinse your body with warm water from the neck down.  8. DO NOT shower/wash with your normal soap after using and rinsing off the CHG Soap.  9. Pat yourself dry with a CLEAN TOWEL.  10. Wear CLEAN PAJAMAS to bed the night before surgery, wear comfortable clothes the morning of  surgery  11. Place CLEAN SHEETS on your bed the night of your first shower and DO NOT SLEEP WITH PETS.   Day of Surgery: Shower with CHG Soap.  Do not apply any deodorants/lotions.  Please wear clean clothes to the hospital/surgery center.   Remember to brush your teeth WITH YOUR REGULAR TOOTHPASTE.   Please read over the following fact sheets that you were given.

## 2020-02-01 NOTE — Progress Notes (Signed)
Chief Complaint:   OBESITY Joy [name] is here to discuss her progress with her obesity treatment plan along with follow-up of her obesity related diagnoses. Joy [name] is on the Category 2 Plan and states she is following her eating plan approximately 65-70% of the time. Joy [name] states she is walking and doing chair yoga.  Today's visit was #: 8 Starting weight: 189 lbs Starting date: 09/30/2019 Today's weight: 183 lbs Today's date: 02/01/2020 Total lbs lost to date: 6 lbs Total lbs lost since last in-office visit: 3 lbs  Interim History: Joy [name] is scheduled for Joy [name] posterior lumbar interbody fusion with thoracic II-lumbar 2 posterior arthrodesis on 02/03/2020.  She had her pre-op appointment this morning at Joy [name], which will include fasting labs.  Subjective:   1. Prediabetes Joy [name] has a diagnosis of prediabetes based on her elevated HgA1c and was informed this puts her at greater risk of developing diabetes. She continues to work on diet and exercise to decrease her risk of diabetes. She denies nausea or hypoglycemia.  She is not on metformin. She denies excessive hunger throughout the day.  Lab Results  Component Value Date   HGBA1C 6.2 (H) 02/01/2020   Lab Results  Component Value Date   INSULIN 13.8 09/30/2019   2. Essential hypertension Review: taking medications as instructed, no medication side effects noted, no chest pain on exertion, no dyspnea on exertion, no swelling of ankles.  Blood pressure is at goal at today's office visit.  She is currently on irbesartan 300 mg daily.  She is followed by Joy. Joy [name] at [facility].  BP Readings from Last 3 Encounters:  02/01/20 140/62  01/14/20 (!) 166/85  01/09/20 (!) 137/111   Assessment/Plan:   1. Prediabetes Joy [name] will continue to work on weight loss, exercise, and decreasing simple carbohydrates to help decrease the risk of diabetes.   2. Essential hypertension Joy [name] is working on healthy weight loss and  exercise to improve blood pressure control. We will watch for signs of hypotension as she continues her lifestyle modifications.  Continue current antihypertensive therapy.  3. Class 1 obesity with serious comorbidity and body mass index (BMI) of 33.0 to 33.9 in adult, unspecified obesity type Joy [name] is currently in the action stage of change. As such, her goal is to continue with weight loss efforts. She has agreed to the Category 2 Plan.   Exercise goals: As is.  Behavioral modification strategies: increasing lean protein intake, increasing water intake, increasing high fiber foods and meal planning and cooking strategies.  Joy [name] has agreed to follow-up with our clinic in 3-4 weeks. She was informed of the importance of frequent follow-up visits to maximize her success with intensive lifestyle modifications for her multiple health conditions.   Objective:   Blood pressure 140/62, pulse 72, temperature 98.4 F (36.9 C), temperature source Oral, height 5\' 2"  (1.575 m), weight 183 lb (83 kg), SpO2 97 %. Body mass index is 33.47 kg/m.  General: Cooperative, alert, well developed, in no acute distress. HEENT: Conjunctivae and lids unremarkable. Cardiovascular: Regular rhythm.  Lungs: Normal work of breathing. Neurologic: No focal deficits.   Lab Results  Component Value Date   CREATININE 0.82 02/01/2020   BUN 20 02/01/2020   NA 137 02/01/2020   K 3.9 02/01/2020   CL 105 02/01/2020   CO2 25 02/01/2020   Lab Results  Component Value Date   ALT 10 09/30/2019   AST 18 09/30/2019   ALKPHOS 72 09/30/2019   BILITOT 0.5 09/30/2019  Lab Results  Component Value Date   HGBA1C 6.2 (H) 02/01/2020   HGBA1C 5.9 (H) 09/30/2019   Lab Results  Component Value Date   INSULIN 13.8 09/30/2019   Lab Results  Component Value Date   TSH 1.700 09/30/2019   Lab Results  Component Value Date   WBC 7.6 02/01/2020   HGB 12.4 02/01/2020   HCT 38.5 02/01/2020   MCV 98.0 02/01/2020   PLT  265 02/01/2020   Obesity Behavioral Intervention Documentation for Insurance:   Approximately 15 minutes were spent on the discussion below.  ASK: We discussed the diagnosis of obesity with [name] today and [name] agreed to give [place] permission to discuss obesity behavioral modification therapy today.  ASSESS: [name] has the diagnosis of obesity and her BMI today is 33.5. Sharlyn is in the action stage of change.   ADVISE: [name] was educated on the multiple health risks of obesity as well as the benefit of weight loss to improve her health. She was advised of the need for long term treatment and the importance of lifestyle modifications to improve her current health and to decrease her risk of future health problems.  AGREE: Multiple dietary modification options and treatment options were discussed and [name] agreed to follow the recommendations documented in the above note.  ARRANGE: [name] was educated on the importance of frequent visits to treat obesity as outlined per CMS and USPSTF guidelines and agreed to schedule her next follow up appointment today.  Attestation Statements:   Reviewed by clinician on day of visit: allergies, medications, problem list, medical history, surgical history, family history, social history, and previous encounter notes.  Time spent on visit including pre-visit chart review and post-visit care and charting was 25 minutes.   I, Water quality scientist, CMA, am acting as Location manager for Mina Marble, NP.  I have reviewed the above documentation for accuracy and completeness, and I agree with the above. -  Esaw Grandchild, NP

## 2020-02-01 NOTE — Progress Notes (Signed)
PCP - Juluis Rainier, MD Cardiologist -  Chilton Si, MD  PPM/ICD - Denies  Chest x-ray - N/A EKG - 11/27/19 Stress Test - 12/02/15 ECHO - Denies Cardiac Cath - Denies  Sleep Study - Denies  *Patient is pre-diabetic. Per patient, she does not check her blood sugar.  CBG at PAT appointment was 97. A1C obtained.  Blood Thinner Instructions: N/A Aspirin Instructions: N/A  ERAS Protcol - No PRE-SURGERY Ensure or G2- N/A  COVID TEST- 02/01/20   Anesthesia review: Yes, review Stress test.  Patient denies shortness of breath, fever, cough and chest pain at PAT appointment   All instructions explained to the patient, with a verbal understanding of the material. Patient agrees to go over the instructions while at home for a better understanding. Patient also instructed to self quarantine after being tested for COVID-19. The opportunity to ask questions was provided.

## 2020-02-02 NOTE — Progress Notes (Addendum)
Anesthesia Chart Review:  Follows yearly with cardiology for management of hypertension, hyperlipidemia, and risk factor modification.  She had a nuclear stress test in 2017 that was low risk, nonischemic, EF 54%.  She also wore a Holter monitor at that time that showed only occasional atrial and ventricular ectopy.  Last seen by Dr. Duke Salvia for 921, doing well from a cardiovascular standpoint.  No changes to management.  Advised to follow-up in 1 year.  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  Follows with pulmonology for history of chronic bronchitis.  Previous 30-pack-year smoking history.  Quit in 1995.  Spirometry 08/21/2018 showed mild obstructive defect with mildly reduced DLCO.  TLC normal.  No significant bronchodilator effect.  Last seen 06/16/2019, per note she was having minimal symptoms off of bronchodilators.  She was recommended to continue using albuterol as needed for shortness of breath or wheezing.  Preop labs reviewed, unremarkable.  Prediabetic with A1c 6.2.  EKG 11/27/2019: NSR.  Rate 72.  CHEST - 2 VIEW 08/21/2018:  COMPARISON:  PA and lateral chest x-ray of May 27, 2018  FINDINGS: The lungs are adequately inflated. There is no focal infiltrate. The heart and pulmonary vascularity are normal. The mediastinum is normal in width. The bony thorax exhibits no acute abnormality.  IMPRESSION: There is no active cardiopulmonary disease.  Lexiscan Myoview4/14/17:  Normal Lexiscan myoview  Nuclear stress EF: 54%.  There was no ST segment deviation noted during stress.  The study is normal.  This is a low risk study.  24 Hour Holter Monitor4/2017: Quality: Fair. Baseline artifact.  Average heart rate: 79 bpm Max heart rate: 111 bpm Min heart rate: 60 bpm Pauses >2.5 seconds: 0  Occasional PACs and PVCs   [facility] Short Stay Center/Anesthesiology Phone [phone] 02/02/2020 9:09 AM

## 2020-02-02 NOTE — Anesthesia Preprocedure Evaluation (Addendum)
Anesthesia Evaluation  Patient identified by MRN, date of birth, ID band Patient awake    Reviewed: Allergy & Precautions, NPO status , Patient's Chart, lab work & pertinent test results  Airway Mallampati: I  TM Distance: >3 FB Neck ROM: Full    Dental   Pulmonary COPD, former smoker,    Pulmonary exam normal        Cardiovascular hypertension, Pt. on medications Normal cardiovascular exam     Neuro/Psych Anxiety    GI/Hepatic GERD  Medicated and Controlled,  Endo/Other    Renal/GU      Musculoskeletal   Abdominal   Peds  Hematology   Anesthesia Other Findings   Reproductive/Obstetrics                            Anesthesia Physical Anesthesia Plan  ASA: III  Anesthesia Plan: General   Post-op Pain Management:    Induction: Intravenous  PONV Risk Score and Plan: 3 and Midazolam, Ondansetron and Treatment may vary due to age or medical condition  Airway Management Planned: Oral ETT  Additional Equipment:   Intra-op Plan:   Post-operative Plan: Extubation in OR  Informed Consent: I have reviewed the patients History and Physical, chart, labs and discussed the procedure including the risks, benefits and alternatives for the proposed anesthesia with the patient or authorized representative who has indicated his/her understanding and acceptance.       Plan Discussed with: CRNA and Surgeon  Anesthesia Plan Comments: (PAT note by Antionette Poles, PA-C: Follows yearly with cardiology for management of hypertension, hyperlipidemia, and risk factor modification.  She had a nuclear stress test in 2017 that was low risk, nonischemic, EF 54%.  She also wore a Holter monitor at that time that showed only occasional atrial and ventricular ectopy.  Last seen by Dr. Duke Salvia for 921, doing well from a cardiovascular standpoint.  No changes to management.  Advised to follow-up in 1 year.  History  of rheumatoid arthritis maintained on methotrexate and Remicade.  Follows with pulmonology for history of chronic bronchitis.  Previous 30-pack-year smoking history.  Quit in 1995.  Spirometry 08/21/2018 showed mild obstructive defect with mildly reduced DLCO.  TLC normal.  No significant bronchodilator effect.  Last seen 06/16/2019, per note she was having minimal symptoms off of bronchodilators.  She was recommended to continue using albuterol as needed for shortness of breath or wheezing.  Preop labs reviewed, unremarkable.  Prediabetic with A1c 6.2.  EKG 11/27/2019: NSR.  Rate 72. CHEST - 2 VIEW 08/21/2018:  COMPARISON:  PA and lateral chest x-ray of May 27, 2018  FINDINGS: The lungs are adequately inflated. There is no focal infiltrate. The heart and pulmonary vascularity are normal. The mediastinum is normal in width. The bony thorax exhibits no acute abnormality.  IMPRESSION: There is no active cardiopulmonary disease.  Lexiscan Myoview4/14/17: Normal Lexiscan myoview Nuclear stress EF: 54%. There was no ST segment deviation noted during stress. The study is normal. This is a low risk study.  24 Hour Holter Monitor4/2017: Quality: Fair. Baseline artifact.  Average heart rate: 79 bpm Max heart rate: 111 bpm Min heart rate: 60 bpm Pauses >2.5 seconds: 0  Occasional PACs and PVCs)      Anesthesia Quick Evaluation

## 2020-02-03 ENCOUNTER — Inpatient Hospital Stay (HOSPITAL_COMMUNITY): Payer: Medicare Other | Admitting: Certified Registered Nurse Anesthetist

## 2020-02-03 ENCOUNTER — Inpatient Hospital Stay (HOSPITAL_COMMUNITY)
Admission: RE | Admit: 2020-02-03 | Discharge: 2020-02-06 | DRG: 455 | Disposition: A | Discharge status: Home Health | Payer: Medicare Other | Attending: Neurosurgery | Admitting: Neurosurgery

## 2020-02-03 ENCOUNTER — Encounter (HOSPITAL_COMMUNITY): Payer: Self-pay | Admitting: Neurosurgery

## 2020-02-03 ENCOUNTER — Inpatient Hospital Stay (HOSPITAL_COMMUNITY): Payer: Medicare Other

## 2020-02-03 ENCOUNTER — Other Ambulatory Visit: Payer: Self-pay

## 2020-02-03 ENCOUNTER — Inpatient Hospital Stay (HOSPITAL_COMMUNITY): Payer: Medicare Other | Admitting: Physician Assistant

## 2020-02-03 ENCOUNTER — Encounter (HOSPITAL_COMMUNITY)
Admission: RE | Disposition: A | Discharge status: Home / Self Care | Payer: Self-pay | Source: Home / Self Care | Attending: Neurosurgery

## 2020-02-03 DIAGNOSIS — Z90710 Acquired absence of both cervix and uterus: Secondary | ICD-10-CM

## 2020-02-03 DIAGNOSIS — M069 Rheumatoid arthritis, unspecified: Secondary | ICD-10-CM | POA: Diagnosis not present

## 2020-02-03 DIAGNOSIS — M810 Age-related osteoporosis without current pathological fracture: Secondary | ICD-10-CM | POA: Diagnosis present

## 2020-02-03 DIAGNOSIS — K219 Gastro-esophageal reflux disease without esophagitis: Secondary | ICD-10-CM | POA: Diagnosis not present

## 2020-02-03 DIAGNOSIS — E669 Obesity, unspecified: Secondary | ICD-10-CM | POA: Diagnosis present

## 2020-02-03 DIAGNOSIS — Z91048 Other nonmedicinal substance allergy status: Secondary | ICD-10-CM | POA: Diagnosis not present

## 2020-02-03 DIAGNOSIS — Z885 Allergy status to narcotic agent status: Secondary | ICD-10-CM | POA: Diagnosis not present

## 2020-02-03 DIAGNOSIS — E7800 Pure hypercholesterolemia, unspecified: Secondary | ICD-10-CM | POA: Diagnosis not present

## 2020-02-03 DIAGNOSIS — Z6833 Body mass index (BMI) 33.0-33.9, adult: Secondary | ICD-10-CM | POA: Diagnosis not present

## 2020-02-03 DIAGNOSIS — Z961 Presence of intraocular lens: Secondary | ICD-10-CM | POA: Diagnosis present

## 2020-02-03 DIAGNOSIS — Z9842 Cataract extraction status, left eye: Secondary | ICD-10-CM

## 2020-02-03 DIAGNOSIS — Z8249 Family history of ischemic heart disease and other diseases of the circulatory system: Secondary | ICD-10-CM

## 2020-02-03 DIAGNOSIS — Z823 Family history of stroke: Secondary | ICD-10-CM | POA: Diagnosis not present

## 2020-02-03 DIAGNOSIS — J449 Chronic obstructive pulmonary disease, unspecified: Secondary | ICD-10-CM | POA: Diagnosis present

## 2020-02-03 DIAGNOSIS — Z87891 Personal history of nicotine dependence: Secondary | ICD-10-CM

## 2020-02-03 DIAGNOSIS — Z96652 Presence of left artificial knee joint: Secondary | ICD-10-CM | POA: Diagnosis present

## 2020-02-03 DIAGNOSIS — F419 Anxiety disorder, unspecified: Secondary | ICD-10-CM | POA: Diagnosis present

## 2020-02-03 DIAGNOSIS — I10 Essential (primary) hypertension: Secondary | ICD-10-CM | POA: Diagnosis not present

## 2020-02-03 DIAGNOSIS — M48062 Spinal stenosis, lumbar region with neurogenic claudication: Principal | ICD-10-CM | POA: Diagnosis present

## 2020-02-03 DIAGNOSIS — M5136 Other intervertebral disc degeneration, lumbar region: Secondary | ICD-10-CM

## 2020-02-03 DIAGNOSIS — M5126 Other intervertebral disc displacement, lumbar region: Secondary | ICD-10-CM | POA: Diagnosis not present

## 2020-02-03 DIAGNOSIS — Z9841 Cataract extraction status, right eye: Secondary | ICD-10-CM | POA: Diagnosis not present

## 2020-02-03 DIAGNOSIS — Z20822 Contact with and (suspected) exposure to covid-19: Secondary | ICD-10-CM | POA: Diagnosis not present

## 2020-02-03 DIAGNOSIS — Z79899 Other long term (current) drug therapy: Secondary | ICD-10-CM | POA: Diagnosis not present

## 2020-02-03 DIAGNOSIS — Z888 Allergy status to other drugs, medicaments and biological substances status: Secondary | ICD-10-CM

## 2020-02-03 DIAGNOSIS — M4316 Spondylolisthesis, lumbar region: Secondary | ICD-10-CM | POA: Diagnosis present

## 2020-02-03 DIAGNOSIS — E785 Hyperlipidemia, unspecified: Secondary | ICD-10-CM | POA: Diagnosis present

## 2020-02-03 DIAGNOSIS — Z8261 Family history of arthritis: Secondary | ICD-10-CM

## 2020-02-03 DIAGNOSIS — Z419 Encounter for procedure for purposes other than remedying health state, unspecified: Secondary | ICD-10-CM

## 2020-02-03 LAB — GLUCOSE, CAPILLARY: Glucose-Capillary: 75 mg/dL (ref 70–99)

## 2020-02-03 SURGERY — POSTERIOR LUMBAR FUSION 1 LEVEL
Anesthesia: General

## 2020-02-03 MED ORDER — PHENYLEPHRINE 40 MCG/ML (10ML) SYRINGE FOR IV PUSH (FOR BLOOD PRESSURE SUPPORT)
PREFILLED_SYRINGE | INTRAVENOUS | Status: AC
  Filled 2020-02-03: qty 10

## 2020-02-03 MED ORDER — CEFAZOLIN SODIUM-DEXTROSE 2-4 GM/100ML-% IV SOLN
2.0000 g | INTRAVENOUS | Status: AC
  Administered 2020-02-03: 2 g via INTRAVENOUS
  Filled 2020-02-03: qty 100

## 2020-02-03 MED ORDER — BUPIVACAINE HCL (PF) 0.5 % IJ SOLN
INTRAMUSCULAR | Status: DC | PRN
  Administered 2020-02-03: 30 mL

## 2020-02-03 MED ORDER — SODIUM CHLORIDE 0.9 % IV SOLN
250.0000 mL | INTRAVENOUS | Status: DC
  Administered 2020-02-03: 250 mL via INTRAVENOUS

## 2020-02-03 MED ORDER — GLYCOPYRROLATE 0.2 MG/ML IJ SOLN
INTRAMUSCULAR | Status: DC | PRN
  Administered 2020-02-03: .1 mg via INTRAVENOUS

## 2020-02-03 MED ORDER — MENTHOL 3 MG MT LOZG: 1.0000 | LOZENGE | OROMUCOSAL | Status: DC | PRN

## 2020-02-03 MED ORDER — MORPHINE SULFATE (PF) 2 MG/ML IV SOLN: 2.0000 mg | INTRAVENOUS | Status: DC | PRN

## 2020-02-03 MED ORDER — OXYCODONE HCL 5 MG PO TABS
5.0000 mg | ORAL_TABLET | ORAL | Status: DC | PRN
  Administered 2020-02-04 – 2020-02-06 (×7): 5 mg via ORAL
  Filled 2020-02-03 (×7): qty 1

## 2020-02-03 MED ORDER — ORAL CARE MOUTH RINSE: 15.0000 mL | Freq: Once | OROMUCOSAL | Status: AC

## 2020-02-03 MED ORDER — ACETAMINOPHEN 650 MG RE SUPP: 650.0000 mg | RECTAL | Status: DC | PRN

## 2020-02-03 MED ORDER — ONDANSETRON HCL 4 MG/2ML IJ SOLN
INTRAMUSCULAR | Status: DC | PRN
  Administered 2020-02-03: 4 mg via INTRAVENOUS

## 2020-02-03 MED ORDER — DEXAMETHASONE SODIUM PHOSPHATE 10 MG/ML IJ SOLN
INTRAMUSCULAR | Status: DC | PRN
  Administered 2020-02-03: 4 mg via INTRAVENOUS

## 2020-02-03 MED ORDER — FENTANYL CITRATE (PF) 250 MCG/5ML IJ SOLN
INTRAMUSCULAR | Status: AC
  Filled 2020-02-03: qty 5

## 2020-02-03 MED ORDER — LIDOCAINE 2% (20 MG/ML) 5 ML SYRINGE
INTRAMUSCULAR | Status: DC | PRN
  Administered 2020-02-03: 100 mg via INTRAVENOUS

## 2020-02-03 MED ORDER — SERTRALINE HCL 50 MG PO TABS
50.0000 mg | ORAL_TABLET | Freq: Every day | ORAL | Status: DC
  Administered 2020-02-03 – 2020-02-05 (×3): 50 mg via ORAL
  Filled 2020-02-03 (×3): qty 1

## 2020-02-03 MED ORDER — VITAMIN B-12 1000 MCG PO TABS
1000.0000 ug | ORAL_TABLET | Freq: Every day | ORAL | Status: DC
  Administered 2020-02-03 – 2020-02-05 (×3): 1000 ug via ORAL
  Filled 2020-02-03 (×3): qty 1

## 2020-02-03 MED ORDER — LACTATED RINGERS IV SOLN: INTRAVENOUS | Status: DC

## 2020-02-03 MED ORDER — PHENOL 1.4 % MT LIQD: 1.0000 | OROMUCOSAL | Status: DC | PRN

## 2020-02-03 MED ORDER — BIOTIN 1000 MCG PO TABS: 1000.0000 ug | ORAL_TABLET | Freq: Every day | ORAL | Status: DC

## 2020-02-03 MED ORDER — SUGAMMADEX SODIUM 200 MG/2ML IV SOLN
INTRAVENOUS | Status: DC | PRN
  Administered 2020-02-03: 200 mg via INTRAVENOUS

## 2020-02-03 MED ORDER — GLYCOPYRROLATE PF 0.2 MG/ML IJ SOSY
PREFILLED_SYRINGE | INTRAMUSCULAR | Status: AC
  Filled 2020-02-03: qty 1

## 2020-02-03 MED ORDER — POLYVINYL ALCOHOL 1.4 % OP SOLN
1.0000 [drp] | Freq: Two times a day (BID) | OPHTHALMIC | Status: DC | PRN
  Filled 2020-02-03 (×2): qty 15

## 2020-02-03 MED ORDER — SODIUM CHLORIDE 0.9% FLUSH
3.0000 mL | Freq: Two times a day (BID) | INTRAVENOUS | Status: DC
  Administered 2020-02-03: 3 mL via INTRAVENOUS

## 2020-02-03 MED ORDER — FENTANYL CITRATE (PF) 100 MCG/2ML IJ SOLN
INTRAMUSCULAR | Status: DC | PRN
  Administered 2020-02-03: 25 ug via INTRAVENOUS
  Administered 2020-02-03: 50 ug via INTRAVENOUS
  Administered 2020-02-03: 25 ug via INTRAVENOUS
  Administered 2020-02-03 (×5): 50 ug via INTRAVENOUS

## 2020-02-03 MED ORDER — LACTATED RINGERS IV SOLN: INTRAVENOUS | Status: DC | PRN

## 2020-02-03 MED ORDER — THROMBIN 20000 UNITS EX SOLR
CUTANEOUS | Status: AC
  Filled 2020-02-03: qty 20000

## 2020-02-03 MED ORDER — ONDANSETRON HCL 4 MG/2ML IJ SOLN
INTRAMUSCULAR | Status: AC
  Filled 2020-02-03: qty 2

## 2020-02-03 MED ORDER — THROMBIN 20000 UNITS EX SOLR
CUTANEOUS | Status: DC | PRN
  Administered 2020-02-03: 20 mL via TOPICAL

## 2020-02-03 MED ORDER — EPHEDRINE SULFATE 50 MG/ML IJ SOLN
INTRAMUSCULAR | Status: DC | PRN
  Administered 2020-02-03 (×3): 5 mg via INTRAVENOUS

## 2020-02-03 MED ORDER — DEXAMETHASONE SODIUM PHOSPHATE 10 MG/ML IJ SOLN
INTRAMUSCULAR | Status: AC
  Filled 2020-02-03: qty 1

## 2020-02-03 MED ORDER — DIAZEPAM 5 MG PO TABS
ORAL_TABLET | ORAL | Status: AC
  Filled 2020-02-03: qty 1

## 2020-02-03 MED ORDER — ALBUMIN HUMAN 5 % IV SOLN
INTRAVENOUS | Status: DC | PRN
Start: 2020-02-03 — End: 2020-02-03

## 2020-02-03 MED ORDER — LIDOCAINE-EPINEPHRINE 0.5 %-1:200000 IJ SOLN
INTRAMUSCULAR | Status: DC | PRN
  Administered 2020-02-03: 10 mL

## 2020-02-03 MED ORDER — PANTOPRAZOLE SODIUM 40 MG PO TBEC
40.0000 mg | DELAYED_RELEASE_TABLET | Freq: Every day | ORAL | Status: DC
  Administered 2020-02-03 – 2020-02-05 (×3): 40 mg via ORAL
  Filled 2020-02-03 (×3): qty 1

## 2020-02-03 MED ORDER — BISACODYL 5 MG PO TBEC: 5.0000 mg | DELAYED_RELEASE_TABLET | Freq: Every day | ORAL | Status: DC | PRN

## 2020-02-03 MED ORDER — MAGNESIUM 200 MG PO TABS
250.0000 mg | ORAL_TABLET | Freq: Every day | ORAL | Status: DC
  Filled 2020-02-03: qty 2

## 2020-02-03 MED ORDER — DIAZEPAM 5 MG PO TABS
5.0000 mg | ORAL_TABLET | Freq: Four times a day (QID) | ORAL | Status: DC | PRN
  Administered 2020-02-03 – 2020-02-04 (×2): 5 mg via ORAL
  Filled 2020-02-03: qty 1

## 2020-02-03 MED ORDER — POTASSIUM CHLORIDE IN NACL 20-0.9 MEQ/L-% IV SOLN: INTRAVENOUS | Status: DC

## 2020-02-03 MED ORDER — MAGNESIUM OXIDE 400 (241.3 MG) MG PO TABS
200.0000 mg | ORAL_TABLET | Freq: Every day | ORAL | Status: DC
  Administered 2020-02-03 – 2020-02-05 (×3): 200 mg via ORAL
  Filled 2020-02-03 (×3): qty 1

## 2020-02-03 MED ORDER — VITAMIN A 3 MG (10000 UNIT) PO CAPS
10000.0000 [IU] | ORAL_CAPSULE | Freq: Every day | ORAL | Status: DC
  Administered 2020-02-03 – 2020-02-05 (×3): 10000 [IU] via ORAL
  Filled 2020-02-03 (×5): qty 1

## 2020-02-03 MED ORDER — CHLORHEXIDINE GLUCONATE CLOTH 2 % EX PADS: 6.0000 | MEDICATED_PAD | Freq: Once | CUTANEOUS | Status: DC

## 2020-02-03 MED ORDER — ZOLPIDEM TARTRATE 5 MG PO TABS: 5.0000 mg | ORAL_TABLET | Freq: Every evening | ORAL | Status: DC | PRN

## 2020-02-03 MED ORDER — HYDROMORPHONE HCL 1 MG/ML IJ SOLN
0.2500 mg | INTRAMUSCULAR | Status: DC | PRN
  Administered 2020-02-03: 0.5 mg via INTRAVENOUS

## 2020-02-03 MED ORDER — SODIUM CHLORIDE 0.9% FLUSH: 3.0000 mL | INTRAVENOUS | Status: DC | PRN

## 2020-02-03 MED ORDER — CELECOXIB 200 MG PO CAPS
200.0000 mg | ORAL_CAPSULE | Freq: Two times a day (BID) | ORAL | Status: DC
  Administered 2020-02-03 – 2020-02-05 (×5): 200 mg via ORAL
  Filled 2020-02-03 (×6): qty 1

## 2020-02-03 MED ORDER — MAGNESIUM CITRATE PO SOLN: 1.0000 | Freq: Once | ORAL | Status: DC | PRN

## 2020-02-03 MED ORDER — PROPOFOL 10 MG/ML IV BOLUS
INTRAVENOUS | Status: DC | PRN
  Administered 2020-02-03: 110 mg via INTRAVENOUS

## 2020-02-03 MED ORDER — VITAMIN D 25 MCG (1000 UNIT) PO TABS
1000.0000 [IU] | ORAL_TABLET | Freq: Every day | ORAL | Status: DC
  Administered 2020-02-04 – 2020-02-05 (×2): 1000 [IU] via ORAL
  Filled 2020-02-03 (×5): qty 1

## 2020-02-03 MED ORDER — ROCURONIUM BROMIDE 10 MG/ML (PF) SYRINGE
PREFILLED_SYRINGE | INTRAVENOUS | Status: DC | PRN
  Administered 2020-02-03: 100 mg via INTRAVENOUS
  Administered 2020-02-03: 25 mg via INTRAVENOUS

## 2020-02-03 MED ORDER — OXYCODONE HCL 5 MG PO TABS
10.0000 mg | ORAL_TABLET | ORAL | Status: DC | PRN
  Administered 2020-02-03 – 2020-02-04 (×2): 10 mg via ORAL
  Filled 2020-02-03: qty 2

## 2020-02-03 MED ORDER — SENNOSIDES-DOCUSATE SODIUM 8.6-50 MG PO TABS: 1.0000 | ORAL_TABLET | Freq: Every evening | ORAL | Status: DC | PRN

## 2020-02-03 MED ORDER — ONDANSETRON HCL 4 MG/2ML IJ SOLN: 4.0000 mg | Freq: Four times a day (QID) | INTRAMUSCULAR | Status: DC | PRN

## 2020-02-03 MED ORDER — ATORVASTATIN CALCIUM 40 MG PO TABS
40.0000 mg | ORAL_TABLET | Freq: Every day | ORAL | Status: DC
  Administered 2020-02-04 – 2020-02-05 (×2): 40 mg via ORAL
  Filled 2020-02-03 (×3): qty 1

## 2020-02-03 MED ORDER — ONDANSETRON HCL 4 MG/2ML IJ SOLN: 4.0000 mg | Freq: Once | INTRAMUSCULAR | Status: DC | PRN

## 2020-02-03 MED ORDER — 0.9 % SODIUM CHLORIDE (POUR BTL) OPTIME
TOPICAL | Status: DC | PRN
  Administered 2020-02-03: 1000 mL

## 2020-02-03 MED ORDER — BUPIVACAINE HCL (PF) 0.5 % IJ SOLN
INTRAMUSCULAR | Status: AC
  Filled 2020-02-03: qty 30

## 2020-02-03 MED ORDER — LIDOCAINE-EPINEPHRINE 0.5 %-1:200000 IJ SOLN
INTRAMUSCULAR | Status: AC
  Filled 2020-02-03: qty 1

## 2020-02-03 MED ORDER — ONDANSETRON HCL 4 MG PO TABS: 4.0000 mg | ORAL_TABLET | Freq: Four times a day (QID) | ORAL | Status: DC | PRN

## 2020-02-03 MED ORDER — GABAPENTIN 300 MG PO CAPS
300.0000 mg | ORAL_CAPSULE | Freq: Every day | ORAL | Status: DC
  Administered 2020-02-03 – 2020-02-05 (×2): 300 mg via ORAL
  Filled 2020-02-03 (×3): qty 1

## 2020-02-03 MED ORDER — OXYCODONE HCL ER 15 MG PO T12A
15.0000 mg | EXTENDED_RELEASE_TABLET | Freq: Two times a day (BID) | ORAL | Status: DC
  Administered 2020-02-03 – 2020-02-05 (×5): 15 mg via ORAL
  Filled 2020-02-03 (×5): qty 1

## 2020-02-03 MED ORDER — FOLIC ACID 1 MG PO TABS
1.0000 mg | ORAL_TABLET | Freq: Every day | ORAL | Status: DC
  Administered 2020-02-03 – 2020-02-05 (×3): 1 mg via ORAL
  Filled 2020-02-03 (×3): qty 1

## 2020-02-03 MED ORDER — CHLORHEXIDINE GLUCONATE 0.12 % MT SOLN
15.0000 mL | Freq: Once | OROMUCOSAL | Status: AC
  Administered 2020-02-03: 15 mL via OROMUCOSAL
  Filled 2020-02-03: qty 15

## 2020-02-03 MED ORDER — SENNA 8.6 MG PO TABS
1.0000 | ORAL_TABLET | Freq: Two times a day (BID) | ORAL | Status: DC
  Administered 2020-02-03 – 2020-02-05 (×4): 8.6 mg via ORAL
  Filled 2020-02-03 (×4): qty 1

## 2020-02-03 MED ORDER — PROPOFOL 10 MG/ML IV BOLUS
INTRAVENOUS | Status: AC
  Filled 2020-02-03: qty 20

## 2020-02-03 MED ORDER — ALBUTEROL SULFATE (2.5 MG/3ML) 0.083% IN NEBU: 2.5000 mg | INHALATION_SOLUTION | Freq: Four times a day (QID) | RESPIRATORY_TRACT | Status: DC | PRN

## 2020-02-03 MED ORDER — CEFAZOLIN SODIUM-DEXTROSE 1-4 GM/50ML-% IV SOLN
1.0000 g | Freq: Three times a day (TID) | INTRAVENOUS | Status: AC
  Administered 2020-02-03 – 2020-02-04 (×2): 1 g via INTRAVENOUS
  Filled 2020-02-03 (×2): qty 50

## 2020-02-03 MED ORDER — ACETAMINOPHEN 325 MG PO TABS
650.0000 mg | ORAL_TABLET | ORAL | Status: DC | PRN
  Administered 2020-02-05 – 2020-02-06 (×3): 650 mg via ORAL
  Filled 2020-02-03 (×4): qty 2

## 2020-02-03 MED ORDER — PHENYLEPHRINE HCL (PRESSORS) 10 MG/ML IV SOLN
INTRAVENOUS | Status: DC | PRN
  Administered 2020-02-03 (×2): 40 ug via INTRAVENOUS

## 2020-02-03 MED ORDER — MEPERIDINE HCL 25 MG/ML IJ SOLN: 6.2500 mg | INTRAMUSCULAR | Status: DC | PRN

## 2020-02-03 MED ORDER — HYDROMORPHONE HCL 1 MG/ML IJ SOLN
INTRAMUSCULAR | Status: AC
  Administered 2020-02-03: 0.5 mg via INTRAVENOUS
  Filled 2020-02-03: qty 1

## 2020-02-03 MED ORDER — OXYCODONE HCL 5 MG PO TABS
ORAL_TABLET | ORAL | Status: AC
  Filled 2020-02-03: qty 2

## 2020-02-03 MED ORDER — IRBESARTAN 300 MG PO TABS
300.0000 mg | ORAL_TABLET | Freq: Every day | ORAL | Status: DC
  Administered 2020-02-03 – 2020-02-05 (×3): 300 mg via ORAL
  Filled 2020-02-03 (×4): qty 1

## 2020-02-03 SURGICAL SUPPLY — 76 items
ADH SKN CLS APL DERMABOND .7 (GAUZE/BANDAGES/DRESSINGS) ×2
APL SKNCLS STERI-STRIP NONHPOA (GAUZE/BANDAGES/DRESSINGS)
BASKET BONE COLLECTION (BASKET) ×1 IMPLANT
BENZOIN TINCTURE PRP APPL 2/3 (GAUZE/BANDAGES/DRESSINGS) IMPLANT
BLADE CLIPPER SURG (BLADE) IMPLANT
BUR MATCHSTICK NEURO 3.0 LAGG (BURR) ×2 IMPLANT
BUR PRECISION FLUTE 5.0 (BURR) ×2 IMPLANT
CAGE POST LUM 11X23X9 6D (Cage) ×2 IMPLANT
CANISTER SUCT 3000ML PPV (MISCELLANEOUS) ×2 IMPLANT
CARTRIDGE OIL MAESTRO DRILL (MISCELLANEOUS) ×1 IMPLANT
CNTNR URN SCR LID CUP LEK RST (MISCELLANEOUS) ×1 IMPLANT
CONN CROSSLINK REV 6.35 48-60 (Connector) ×2 IMPLANT
CONNECTOR CRSLNK REV6.35 48-60 (Connector) IMPLANT
CONT SPEC 4OZ STRL OR WHT (MISCELLANEOUS) ×2
COVER BACK TABLE 60X90IN (DRAPES) ×2 IMPLANT
COVER WAND RF STERILE (DRAPES) ×2 IMPLANT
DECANTER SPIKE VIAL GLASS SM (MISCELLANEOUS) ×2 IMPLANT
DERMABOND ADVANCED (GAUZE/BANDAGES/DRESSINGS) ×2
DERMABOND ADVANCED .7 DNX12 (GAUZE/BANDAGES/DRESSINGS) ×1 IMPLANT
DIFFUSER DRILL AIR PNEUMATIC (MISCELLANEOUS) ×2 IMPLANT
DRAPE C-ARM 42X72 X-RAY (DRAPES) ×3 IMPLANT
DRAPE C-ARMOR (DRAPES) ×1 IMPLANT
DRAPE LAPAROTOMY 100X72X124 (DRAPES) ×2 IMPLANT
DRAPE SURG 17X23 STRL (DRAPES) ×2 IMPLANT
DURAPREP 26ML APPLICATOR (WOUND CARE) ×2 IMPLANT
ELECT REM PT RETURN 9FT ADLT (ELECTROSURGICAL) ×2
ELECTRODE REM PT RTRN 9FT ADLT (ELECTROSURGICAL) ×1 IMPLANT
GAUZE 4X4 16PLY RFD (DISPOSABLE) ×1 IMPLANT
GAUZE SPONGE 4X4 12PLY STRL (GAUZE/BANDAGES/DRESSINGS) IMPLANT
GLOVE BIOGEL PI IND STRL 6.5 (GLOVE) IMPLANT
GLOVE BIOGEL PI INDICATOR 6.5 (GLOVE) ×2
GLOVE ECLIPSE 6.5 STRL STRAW (GLOVE) ×4 IMPLANT
GLOVE EXAM NITRILE XL STR (GLOVE) IMPLANT
GLOVE SURG SS PI 6.5 STRL IVOR (GLOVE) ×3 IMPLANT
GOWN STRL REUS W/ TWL LRG LVL3 (GOWN DISPOSABLE) ×2 IMPLANT
GOWN STRL REUS W/ TWL XL LVL3 (GOWN DISPOSABLE) IMPLANT
GOWN STRL REUS W/TWL 2XL LVL3 (GOWN DISPOSABLE) IMPLANT
GOWN STRL REUS W/TWL LRG LVL3 (GOWN DISPOSABLE) ×6
GOWN STRL REUS W/TWL XL LVL3 (GOWN DISPOSABLE) ×2
GRAFT BN 5X1XSPNE CVD POST DBM (Bone Implant) IMPLANT
GRAFT BONE MAGNIFUSE 1X5CM (Bone Implant) ×2 IMPLANT
GRAFT DURAGEN MATRIX 3WX3L (Graft) ×2 IMPLANT
GRAFT DURAGEN MATRIX 3X3 SNGL (Graft) IMPLANT
KIT BASIN OR (CUSTOM PROCEDURE TRAY) ×2 IMPLANT
KIT POSITION SURG JACKSON T1 (MISCELLANEOUS) ×2 IMPLANT
KIT TURNOVER KIT B (KITS) ×2 IMPLANT
MILL MEDIUM DISP (BLADE) IMPLANT
NDL HYPO 25X1 1.5 SAFETY (NEEDLE) ×1 IMPLANT
NDL SPNL 18GX3.5 QUINCKE PK (NEEDLE) IMPLANT
NEEDLE HYPO 25X1 1.5 SAFETY (NEEDLE) ×2 IMPLANT
NEEDLE SPNL 18GX3.5 QUINCKE PK (NEEDLE) ×2 IMPLANT
NS IRRIG 1000ML POUR BTL (IV SOLUTION) ×2 IMPLANT
OIL CARTRIDGE MAESTRO DRILL (MISCELLANEOUS) ×2
PACK LAMINECTOMY NEURO (CUSTOM PROCEDURE TRAY) ×2 IMPLANT
PAD ARMBOARD 7.5X6 YLW CONV (MISCELLANEOUS) ×2 IMPLANT
PATTIES SURGICAL 1X1 (DISPOSABLE) ×1 IMPLANT
RASP 3.0MM (RASP) ×1 IMPLANT
ROD L635 100MM PREBENT (Rod) ×1 IMPLANT
ROD L635 HEX END UNLINED (Rod) ×1 IMPLANT
SCREW PEDICLE VA L635 5.5X40M (Screw) ×2 IMPLANT
SCREW PEDICLE VA L635 6.5X40M (Screw) ×4 IMPLANT
SCREW SET BREAK OFF (Screw) ×10 IMPLANT
SPONGE LAP 4X18 RFD (DISPOSABLE) IMPLANT
SPONGE SURGIFOAM ABS GEL 100 (HEMOSTASIS) ×2 IMPLANT
STRIP CLOSURE SKIN 1/2X4 (GAUZE/BANDAGES/DRESSINGS) IMPLANT
SUT PROLENE 6 0 BV (SUTURE) IMPLANT
SUT VIC AB 0 CT1 18XCR BRD8 (SUTURE) ×1 IMPLANT
SUT VIC AB 0 CT1 8-18 (SUTURE) ×4
SUT VIC AB 2-0 CT1 18 (SUTURE) ×3 IMPLANT
SUT VIC AB 3-0 SH 8-18 (SUTURE) ×5 IMPLANT
TOWEL GREEN STERILE (TOWEL DISPOSABLE) ×2 IMPLANT
TOWEL GREEN STERILE FF (TOWEL DISPOSABLE) ×2 IMPLANT
TRAY FOL W/BAG SLVR 16FR STRL (SET/KITS/TRAYS/PACK) IMPLANT
TRAY FOLEY MTR SLVR 16FR STAT (SET/KITS/TRAYS/PACK) ×1 IMPLANT
TRAY FOLEY W/BAG SLVR 16FR LF (SET/KITS/TRAYS/PACK) ×2
WATER STERILE IRR 1000ML POUR (IV SOLUTION) ×2 IMPLANT

## 2020-02-03 NOTE — Anesthesia Postprocedure Evaluation (Signed)
Anesthesia Post Note  Patient: Joy [name]  Procedure(s) Performed: Lumbar one-two Posterior lumbar interbody fusion with Thoracic eleven to Lumbar two Posterior arthrodesis (N/A )     Patient location during evaluation: PACU Anesthesia Type: General Level of consciousness: awake and alert Pain management: pain level controlled Vital Signs Assessment: post-procedure vital signs reviewed and stable Respiratory status: spontaneous breathing, nonlabored ventilation, respiratory function stable and patient connected to nasal cannula oxygen Cardiovascular status: blood pressure returned to baseline and stable Postop Assessment: no apparent nausea or vomiting Anesthetic complications: no   No complications documented.  Last Vitals:  Vitals:   02/03/20 1710 02/03/20 1725  BP: (!) 102/58 (!) 126/56  Pulse: (!) 57 69  Resp: 11 15  Temp:    SpO2: 100% 100%    Last Pain:  Vitals:   02/03/20 1710  TempSrc:   PainSc: 5                  Paulett Kaufhold DAVID

## 2020-02-03 NOTE — Transfer of Care (Signed)
Immediate Anesthesia Transfer of Care Note  Patient: Joy [name]  Procedure(s) Performed: Lumbar one-two Posterior lumbar interbody fusion with Thoracic eleven to Lumbar two Posterior arthrodesis (N/A )  Patient Location: PACU  Anesthesia Type:General  Level of Consciousness: awake and alert   Airway & Oxygen Therapy: Patient Spontanous Breathing and Patient connected to nasal cannula oxygen  Post-op Assessment: Report given to RN and Post -op Vital signs reviewed and stable  Post vital signs: Reviewed and stable  Last Vitals:  Vitals Value Taken Time  BP 122/109 02/03/20 1524  Temp    Pulse    Resp 22 02/03/20 1532  SpO2    Vitals shown include unvalidated device data.  Last Pain:  Vitals:   02/03/20 0834  TempSrc:   PainSc: 0-No pain         Complications: No complications documented.

## 2020-02-03 NOTE — H&P (Signed)
BP (!) 170/64   Pulse 69   Temp 98 F (36.7 C) (Oral)   Resp 18   Ht 5' 2" (1.575 m)   Wt 82.6 kg   SpO2 97%   BMI 33.29 kg/m  Joy [name] returns today to go over the MRI of the lumbar spine.  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  Since this is at L1-2, I will have to extend the fusion to T11 to make sure we cross the junction and the disc herniation is undoubtedly the reason why she is having the discomfort she has in the lower extremities.   Allergies  Allergen Reactions  . Codeine Other (See Comments)    Lump in throat  . Tape Hives and Rash    Paper tape only  . Latex Rash  . Nickel Rash    Bumps Also other metals   Past Medical History:  Diagnosis Date  . Ankylosing spondylitis ([place])   . Anxiety   . Arthritis    RHEUMATOID  . Back pain   . Bronchitis   . Constipation   . COPD (chronic obstructive pulmonary disease) ([place])    CXR 01/11/18 showed mild COPD and chronic bronchitis  . CTS (carpal tunnel syndrome)   . Dry eye   . Dry mouth   . Dysrhythmia    "irregularity" unknown at this time - being evaluated by cardiology  . Essential hypertension 11/24/2015  . GERD (gastroesophageal reflux disease)   . HLA B27 (HLA B27 positive)   . Hypercholesteremia   . Hyperlipidemia 11/24/2015  . Hypertension   . IBS (irritable bowel syndrome)   . Joint pain   . Neuropathy   . OAB (overactive bladder)   . Obesity   . Osteoarthritis   . Osteoporosis   . Pre-diabetes   . RA (rheumatoid arthritis) ([place])   . Rheumatoid arthritis ([place]) 11/24/2015  . Sciatica   . Seasonal allergies   . Spinal stenosis    Past Surgical History:  Procedure Laterality Date  . ABDOMINAL HYSTERECTOMY  1995  . BACK SURGERY    . BREAST SURGERY     REDUCTION  . CATARACT EXTRACTION W/PHACO  08/01/2012   Procedure: CATARACT EXTRACTION PHACO AND INTRAOCULAR LENS PLACEMENT (IOC);  Surgeon: Marylynn Pearson, MD;  Location: [facility];  Service:  Ophthalmology;  Laterality: Right;  . EYE SURGERY Bilateral    cataract  . HERNIA REPAIR     RIGHT ING.  . JOINT REPLACEMENT  2014   rt total knee  . TONSILLECTOMY    . TOTAL KNEE ARTHROPLASTY Left 12/26/2015   Procedure: TOTAL KNEE ARTHROPLASTY;  Surgeon: Gaynelle Arabian, MD;  Location: [facility];  Service: Orthopedics;  Laterality: Left;   Prior to Admission medications   Medication Sig Start Date End Date Taking? Authorizing Provider  albuterol (VENTOLIN HFA) 108 (90 Base) MCG/ACT inhaler Inhale 2 puffs into the lungs every 6 (six) hours as needed for wheezing or shortness of breath. 02/12/19  Yes Lauraine Rinne, NP  atorvastatin (LIPITOR) 40 MG tablet Take 40 mg by mouth daily.   Yes [provider]  Biotin 1000 MCG tablet Take 1 tablet (1 mg total) by mouth daily. 07/09/18  Yes Hennie Duos, MD  Cholecalciferol (VITAMIN D3) 25 MCG (1000 UT) CAPS Take 1 capsule (1,000 Units total) by mouth daily. 07/09/18  Yes Hennie Duos, MD  folic acid (FOLVITE) 1 MG tablet Take 1 tablet (1 mg total)  by mouth daily. 07/09/18  Yes Hennie Duos, MD  gabapentin (NEURONTIN) 300 MG capsule Take 300 mg by mouth at bedtime.  10/27/15  Yes [provider]  inFLIXimab (REMICADE IV) Inject 100 mg into the vein every 6 (six) weeks.   Yes [provider]  irbesartan (AVAPRO) 300 MG tablet Take 1 tablet (300 mg total) by mouth daily. 07/09/18  Yes Hennie Duos, MD  Magnesium 250 MG TABS Take 1 tablet (250 mg total) by mouth daily. 07/09/18  Yes Hennie Duos, MD  Methotrexate Sodium (METHOTREXATE, PF,) 50 MG/2ML injection INJECT 1 ML ONCE WEEKLY. (25 mg)  DISCARD VIAL AFTER USE 11/13/18  Yes [provider]  naproxen sodium (ALEVE) 220 MG tablet Take 220 mg by mouth 2 (two) times daily as needed (pain).   Yes [provider]  oxyCODONE (OXY IR/ROXICODONE) 5 MG immediate release tablet Take 5 mg by mouth every 6 (six) hours as needed for severe pain.  01/13/20   Yes [provider]  pantoprazole (PROTONIX) 40 MG tablet Take 1 tablet (40 mg total) by mouth daily. 07/09/18  Yes Hennie Duos, MD  Propylene Glycol (SYSTANE BALANCE) 0.6 % SOLN Place 1 drop into both eyes 2 (two) times daily as needed (dry eyes).   Yes [provider]  sertraline (ZOLOFT) 25 MG tablet Take 50 mg by mouth daily.    Yes [provider]  VITAMIN A PO Take 2,400 mcg by mouth daily.    Yes [provider]  vitamin B-12 (CYANOCOBALAMIN) 1000 MCG tablet Take 1 tablet (1,000 mcg total) by mouth daily. 07/09/18  Yes Hennie Duos, MD  ID NOW COVID-19 KIT See admin instructions. for testing 12/18/19   [provider]  inFLIXimab in sodium chloride 0.9 % Inject into the vein. Pt receives this infusion every 6 weeks    [provider]   Family History  Problem Relation Age of Onset  . Heart disease Mother   . Cerebral aneurysm Mother   . Hypertension Mother   . Obesity Mother   . Heart attack Father   . Heart disease Father   . Hypertension Father   . Hypertension Sister   . Arthritis Sister   . Stroke Sister   . Hypertension Sister   . Arthritis Sister   . Hypertension Sister   . Arthritis Sister   . Hypertension Sister   . Hypertension Sister    Social History   Socioeconomic History  . Marital status: Divorced    Spouse name: Not on file  . Number of children: Not on file  . Years of education: Not on file  . Highest education level: Not on file  Occupational History  . Not on file  Tobacco Use  . Smoking status: Former Smoker    Packs/day: 1.00    Years: 35.00    Pack years: 35.00    Quit date: 05/29/1995    Years since quitting: 24.7  . Smokeless tobacco: Never Used  Vaping Use  . Vaping Use: Never used  Substance and Sexual Activity  . Alcohol use: Yes    Comment: rare  . Drug use: No  . Sexual activity: Not on file  Other Topics Concern  . Not on file  Social History Narrative  . Not on  file   Social Determinants of Health   Financial Resource Strain: Low Risk   . Difficulty of Paying Living Expenses: Not hard at all  Food Insecurity: No Food  Insecurity  . Worried About Charity fundraiser in the Last Year: Never true  . Ran Out of Food in the Last Year: Never true  Transportation Needs: No Transportation Needs  . Lack of Transportation (Medical): No  . Lack of Transportation (Non-Medical): No  Physical Activity: Insufficiently Active  . Days of Exercise per Week: 2 days  . Minutes of Exercise per Session: 30 min  Stress: No Stress Concern Present  . Feeling of Stress : Not at all  Social Connections: Unknown  . Frequency of Communication with Friends and Family: More than three times a week  . Frequency of Social Gatherings with Friends and Family: Not on file  . Attends Religious Services: Not on file  . Active Member of Clubs or Organizations: Not on file  . Attends Archivist Meetings: Not on file  . Marital Status: Not on file  Intimate Partner Violence: Not At Risk  . Fear of Current or Ex-Partner: No  . Emotionally Abused: No  . Physically Abused: No  . Sexually Abused: No       She is alert, oriented by 4.  She answers all questions appropriately.  Memory, language, attention span, and fund of knowledge are normal.  Speech is clear.  It is also fluent.  She is 61 inches, weighs 180 pounds, blood pressure is 142/83, pulse is 83, temperature 97.  Pain is 10/10.       She is well acquainted with all the risks and benefits of a lumbar fusion.  We will get this scheduled at her convenience.  On exam, she has good strength in the lower extremities 5/5, and in the upper extremities normal muscle tone, bulk, and coordination.  Hearing intact to voice.

## 2020-02-03 NOTE — Anesthesia Procedure Notes (Signed)
Procedure Name: Intubation Date/Time: 02/03/2020 9:28 AM Performed by: Inda Coke, CRNA Pre-anesthesia Checklist: Patient identified, Emergency Drugs available, Suction available and Patient being monitored Patient Re-evaluated:Patient Re-evaluated prior to induction Oxygen Delivery Method: Circle System Utilized Preoxygenation: Pre-oxygenation with 100% oxygen Induction Type: IV induction Ventilation: Mask ventilation without difficulty Laryngoscope Size: Mac and 3 Grade View: Grade I Tube type: Oral Tube size: 7.0 mm Number of attempts: 1 Airway Equipment and Method: Stylet and Oral airway Placement Confirmation: ETT inserted through vocal cords under direct vision,  positive ETCO2 and breath sounds checked- equal and bilateral Secured at: 22 cm Tube secured with: Tape Dental Injury: Teeth and Oropharynx as per pre-operative assessment

## 2020-02-03 NOTE — Op Note (Signed)
02/03/2020  3:43 PM  PATIENT:  Joy [name]  79 y.o. 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 Filled with autograft morsels, and same placed in the disc space Thoracic eleven to Lumbar two Posterior arthrodesis allograft morsels Segmental pedicle screw fixation T11- L2 Medtronic instrumentation, 6.35 rods Laminectomy in excess of needed exposure for a plif SURGEON:  Surgeon(s): Coletta Memos, MD  ASSISTANTS:none  ANESTHESIA:   general  EBL:  Total I/O In: 2250 [I.V.:2000; IV Piggyback:250] Out: 550 [Urine:350; Blood:200]  BLOOD ADMINISTERED:none  CELL SAVER GIVEN:none  COUNT:per nursing  DRAINS: none   SPECIMEN:  No Specimen  DICTATION: Joy [name] is a 79 y.o. female whom was taken to the operating room intubated, and placed under a general anesthetic without difficulty. A foley catheter was placed under sterile conditions. She was positioned prone on a Jackson table with all pressure points properly padded.  Her  lumbar region was prepped and draped in a sterile manner. I infiltrated 10cc's 1/2%lidocaine/1:2000,000 strength epinephrine into the planned incision. I opened the skin with a 10 blade and took the incision down to the thoracolumbar fascia. I exposed the lamina of L1, T11, T12 in a subperiosteal fashion bilaterally. I confirmed my location with an intraoperative xray.  I placed self retaining retractors and started the decompression.  I decompressed the spinal canal via a complete laminectomy of L1, and inferior facetectomies of L1 bilaterally. I used the drill and Kerrison punches. I decompressed the L1 roots through the foramina and lateral recesses. The laminectomy and facetectomies were beyond the  needed bony exposure for a simple PLIF. PLIF's were performed at L1/2 in the same fashion. I opened the disc space with a 15 blade then used a variety of instruments to remove the disc and prepare the space for the arthrodesis. I used curettes, rongeurs, punches, shavers for the disc space, and rasps in the discetomy. I measured the disc space and placed 64mm x 4mm Titanium cages(Stryker tritanium pl) into the disc space(s).  I decorticated the lateral bone at T11-L2. I then placed autograft morsels on the left, and bone in a bag on the right side on the decorticated surfaces to complete the posterolateral arthrodesis.  I placed pedicle screws at T11,12,L1, using fluoroscopic guidance. I drilled a pilot hole, then cannulated the pedicle with a bone probe at each site. I then tapped each pedicle, assessing each site for pedicle violations. No cutouts were appreciated. Screws (Medtronic) were then placed at each site without difficulty. I attached rods and locking caps with the appropriate tools. The locking caps were secured with torque limited screwdrivers. Final films were performed and the final construct appeared to be in good position.  I closed the wound in a layered fashion. I approximated the thoracolumbar fascia, subcutaneous, and subcuticular planes with vicryl sutures. I used dermabond, and an occlusive bandage for a sterile dressing.     PLAN OF CARE: Admit to inpatient   PATIENT DISPOSITION:  PACU - hemodynamically stable.   Delay start of Pharmacological VTE agent (>24hrs) due to surgical blood loss or risk of bleeding:  yes

## 2020-02-04 ENCOUNTER — Other Ambulatory Visit: Payer: Self-pay | Admitting: *Deleted

## 2020-02-04 NOTE — Evaluation (Signed)
Occupational Therapy Evaluation Patient Details Name: Joy [name] MRN: [id number] DOB: Nov 06, 1940 Today's Date: 02/04/2020    History of Present Illness 79 y/o female s/p L1-2 PLIF with T11-L2 arthodesis. PMH includes previous back sxs, bil TKR, COPD, RA, HTN, anxiety.   Clinical Impression   PTA pt living with family, independent for BADL and as needed assist for IADL. Pt with hx of back sxs and familiar with education. At time of eval, she is able to complete sit <> stands with min guard assist and RW. Pt completed functional mobility, toilet transfer, and LB dressing with mod I assist. Back handout provided and reviewed adls in detail. Pt educated on: set an alarm at night for medication, avoid sitting for long periods of time, correct bed positioning for sleeping, correct sequence for bed mobility, avoiding lifting more than 5 pounds and never wash directly over incision. Educated pt on use of BSC in shower for increased safety. She does have a flight of stairs up to bedroom with full bath. Education given on safety, and wash ups if not able to complete stairs one day. All education is complete and patient indicates understanding. No further OT needs identified, OT will sign off. Thank you for this consult.     Follow Up Recommendations  No OT follow up;Supervision - Intermittent    Equipment Recommendations  3 in 1 bedside commode    Recommendations for Other Services       Precautions / Restrictions Precautions Precautions: Back;Fall Precaution Booklet Issued: Yes (comment) Precaution Comments: reviewed in context of BADL Required Braces or Orthoses:  (no brace needed per order) Restrictions Weight Bearing Restrictions: No      Mobility Bed Mobility               General bed mobility comments: sitting EOB on arrival  Transfers Overall transfer level: Needs assistance Equipment used: Rolling walker (2 wheeled) Transfers: Sit to/from Stand Sit to Stand: Min  guard         General transfer comment: increased time and effort, cues for safe hand placement on RW    Balance Overall balance assessment: Mild deficits observed, not formally tested                                         ADL either performed or assessed with clinical judgement   ADL Overall ADL's : Modified independent                                       General ADL Comments: Pt presents with ability to complete BADL at mod I level. Pt completed toilet transfer, functional mobility, and LB dressing without external assist. Reviewed safe back precautions with pt typical BADL routine. Education given on use of BSC in tub shower and toilet riser over low toilet on main level.     Vision Patient Visual Report: No change from baseline       Perception     Praxis      Pertinent Vitals/Pain Pain Assessment: Faces Faces Pain Scale: Hurts little more Pain Location: incisional site, specifically L lower back Pain Descriptors / Indicators: Aching;Sore Pain Intervention(s): Monitored during session;Repositioned     Hand Dominance     Extremity/Trunk Assessment Upper Extremity Assessment Upper Extremity Assessment: Overall WFL for tasks assessed  Lower Extremity Assessment Lower Extremity Assessment: Defer to PT evaluation       Communication Communication Communication: No difficulties   Cognition Arousal/Alertness: Awake/alert Behavior During Therapy: WFL for tasks assessed/performed Overall Cognitive Status: Within Functional Limits for tasks assessed                                     General Comments       Exercises     Shoulder Instructions      Home Living Family/patient expects to be discharged to:: Private residence Living Arrangements: Children;Other relatives Available Help at Discharge: Family;Available PRN/intermittently Type of Home: House Home Access: Stairs to enter Entrance Stairs-Number  of Steps: 2   Home Layout: Two level Alternate Level Stairs-Number of Steps: 14 Alternate Level Stairs-Rails: Right Bathroom Shower/Tub: Teacher, early years/pre: Handicapped height (standard height downstairs, but has riser)     Home Equipment: Walker - 2 wheels;Wheelchair - manual   Additional Comments: Pt plans to stay on first floor as needed, but does not have full bath on first floor.      Prior Functioning/Environment Level of Independence: Independent                 OT Problem List: Decreased knowledge of use of DME or AE;Decreased knowledge of precautions;Decreased activity tolerance;Impaired balance (sitting and/or standing);Pain      OT Treatment/Interventions:      OT Goals(Current goals can be found in the care plan section) Acute Rehab OT Goals Patient Stated Goal: return to independence OT Goal Formulation: All assessment and education complete, DC therapy  OT Frequency:     Barriers to D/C:            Co-evaluation              AM-PAC OT "6 Clicks" Daily Activity     Outcome Measure Help from another person eating meals?: None Help from another person taking care of personal grooming?: None Help from another person toileting, which includes using toliet, bedpan, or urinal?: None Help from another person bathing (including washing, rinsing, drying)?: None Help from another person to put on and taking off regular upper body clothing?: None Help from another person to put on and taking off regular lower body clothing?: None 6 Click Score: 24   End of Session Equipment Utilized During Treatment: Rolling walker Nurse Communication: Mobility status  Activity Tolerance: Patient tolerated treatment well Patient left: in chair;with call bell/phone within reach  OT Visit Diagnosis: Other abnormalities of gait and mobility (R26.89);Pain Pain - part of body:  (back)                Time: 3716-9678 OT Time Calculation (min): 29  min Charges:  OT General Charges $OT Visit: 1 Visit OT Evaluation $OT Eval Low Complexity: 1 Low OT Treatments $Self Care/Home Management : 8-22 mins  Zenovia Jarred, MSOT, OTR/L Acute Rehabilitation Services [facility] Office Number: [phone] Pager: [phone]  Zenovia Jarred 02/04/2020, 9:06 AM

## 2020-02-04 NOTE — Evaluation (Addendum)
Physical Therapy Evaluation Patient Details Name: Joy [name] MRN: [id number] DOB: 11-Oct-1940 Today's Date: 02/04/2020   History of Present Illness  79 y/o female s/p L1-2 PLIF with T11-L2 arthodesis. PMH includes previous back sxs, bil TKR, COPD, RA, HTN, anxiety.  Clinical Impression  Pt admitted with above diagnosis. At the time of PT eval, pt was able to demonstrate transfers and ambulation with gross supervision for safety and RW for support. Pt appeared to fatigue quickly during ambulation, and feel a rollator would be beneficial so pt can take safe seated rest breaks when mobilizing as needed. Pt was educated on precautions, brace application/wearing schedule, appropriate activity progression, and car transfer. Pt currently with functional limitations due to the deficits listed below (see PT Problem List). Pt will benefit from skilled PT to increase their independence and safety with mobility to allow discharge to the venue listed below.        Follow Up Recommendations No PT follow up;Supervision for mobility/OOB    Equipment Recommendations  Other (comment) (Rollator if pt can get it (she is 5'2"))    Recommendations for Other Services       Precautions / Restrictions Precautions Precautions: Back;Fall Precaution Booklet Issued: Yes (comment) Precaution Comments: reviewed in context of BADL Required Braces or Orthoses:  (no brace needed per order) Restrictions Weight Bearing Restrictions: No      Mobility  Bed Mobility               General bed mobility comments: Pt was received sitting up in the recliner chair  Transfers Overall transfer level: Needs assistance Equipment used: Rolling walker (2 wheeled) Transfers: Sit to/from Stand Sit to Stand: Supervision         General transfer comment: Supervision for safety. Pt demonstrater proper hand placement on seated surface for safety.   Ambulation/Gait Ambulation/Gait assistance: Supervision Gait  Distance (Feet): 275 Feet Assistive device: Rolling walker (2 wheeled) Gait Pattern/deviations: Step-through pattern;Decreased stride length;Trunk flexed Gait velocity: Decreased Gait velocity interpretation: <1.8 ft/sec, indicate of risk for recurrent falls General Gait Details: Slow but generally steady with RW for support. Pt taking several very short standing rest breaks due to fatigue but was motivated for distance and did not want to stop. VC's for improved posture and closer walker proximity.   Stairs Pt was able to negotiate a flight of stairs with B railings and min guard assist for balance support and safety. Overall pt was moving slow but steady.   Wheelchair Mobility    Modified Rankin (Stroke Patients Only)       Balance Overall balance assessment: Mild deficits observed, not formally tested                                           Pertinent Vitals/Pain Pain Assessment: Faces Faces Pain Scale: Hurts whole lot Pain Location: 1 instance of more severe back pain when initiating stand>sit at L lower back Pain Descriptors / Indicators: Operative site guarding;Grimacing;Moaning Pain Intervention(s): Limited activity within patient's tolerance;Monitored during session;Repositioned    Home Living Family/patient expects to be discharged to:: Private residence Living Arrangements: Children;Other relatives Available Help at Discharge: Family;Available PRN/intermittently Type of Home: House Home Access: Stairs to enter   Entrance Stairs-Number of Steps: 2 Home Layout: Two level Home Equipment: Walker - 2 wheels;Wheelchair - manual Additional Comments: Pt plans to stay on first floor as needed, but does  not have full bath on first floor.    Prior Function Level of Independence: Independent               Hand Dominance   Dominant Hand: Right    Extremity/Trunk Assessment   Upper Extremity Assessment Upper Extremity Assessment: Defer to OT  evaluation    Lower Extremity Assessment Lower Extremity Assessment: Generalized weakness       Communication   Communication: No difficulties  Cognition Arousal/Alertness: Awake/alert Behavior During Therapy: WFL for tasks assessed/performed Overall Cognitive Status: Within Functional Limits for tasks assessed                                        General Comments      Exercises     Assessment/Plan    PT Assessment Patient needs continued PT services  PT Problem List Decreased strength;Decreased activity tolerance;Decreased balance;Decreased mobility;Decreased knowledge of use of DME;Decreased safety awareness;Decreased knowledge of precautions;Pain       PT Treatment Interventions DME instruction;Gait training;Stair training;Functional mobility training;Therapeutic activities;Therapeutic exercise;Neuromuscular re-education;Patient/family education    PT Goals (Current goals can be found in the Care Plan section)  Acute Rehab PT Goals Patient Stated Goal: return to independence PT Goal Formulation: With patient Time For Goal Achievement: 02/11/20 Potential to Achieve Goals: Good    Frequency Min 5X/week   Barriers to discharge        Co-evaluation               AM-PAC PT "6 Clicks" Mobility  Outcome Measure Help needed turning from your back to your side while in a flat bed without using bedrails?: None Help needed moving from lying on your back to sitting on the side of a flat bed without using bedrails?: None Help needed moving to and from a bed to a chair (including a wheelchair)?: A Little Help needed standing up from a chair using your arms (e.g., wheelchair or bedside chair)?: A Little Help needed to walk in hospital room?: A Little Help needed climbing 3-5 steps with a railing? : A Little 6 Click Score: 20    End of Session Equipment Utilized During Treatment: Gait belt Activity Tolerance: Patient tolerated treatment  well Patient left: in chair;with call bell/phone within reach Nurse Communication: Mobility status PT Visit Diagnosis: Pain;Difficulty in walking, not elsewhere classified (R26.2) Pain - Right/Left: Left Pain - part of body:  (low back)    Time: 9326-7124 PT Time Calculation (min) (ACUTE ONLY): 24 min   Charges:   PT Evaluation $PT Eval Low Complexity: 1 Low PT Treatments $Gait Training: 8-22 mins        Conni Slipper, PT, DPT Acute Rehabilitation Services Pager: [phone] Office: [phone]   Marylynn Pearson 02/04/2020, 10:25 AM

## 2020-02-04 NOTE — Progress Notes (Signed)
Patient ID: Joy [name], female   DOB: 05-Feb-1941, 79 y.o.   MRN: [id number] BP (!) 113/49 (BP Location: Left Arm)   Pulse 71   Temp 98.2 F (36.8 C) (Oral)   Resp 16   Ht 5\' 2"  (1.575 m)   Wt 82.6 kg   SpO2 94%   BMI 33.29 kg/m  Alert and oriented x 4, speech is clear and fluent Moving all extremities well Wound is clean, dry Does not believe she would be able to stay at home, daughter physically will not be able to help in any physical fashion.

## 2020-02-04 NOTE — Patient Outreach (Signed)
[facility]) Care Management  02/04/2020  Joy [name] 1940/11/18 [id number]   RN Health Coach Discipline Closure  Referral Date: 02/05/2019 Referral Source:Primary MD Reason for Referral: Medication Assistance Insurance: Medicare   Outreach Attempt:  Received notification patient admitted to [facility] following elective Lumbar Surgery.  [facility] RN Hospital Liaison is following and plans to place order for [facility] Care Coordinator referral on discharge.  Plan:  RN Health Coach will close Disease Management Case at this time.  RN Health Coach will send primary care provider Discipline Closure Letter.   Rhae Lerner RN [facility] Care Management  RN Health Coach [phone] [email or web address]

## 2020-02-05 MED ORDER — TIZANIDINE HCL 4 MG PO TABS
4.0000 mg | ORAL_TABLET | Freq: Four times a day (QID) | ORAL | 0 refills | Status: DC | PRN
Start: 2020-02-05 — End: 2021-04-03

## 2020-02-05 NOTE — Progress Notes (Signed)
Physical Therapy Treatment Patient Details Name: Joy [name] MRN: [id number] DOB: [birth year] Today's Date: 02/05/2020    History of Present Illness 79 y/o female s/p L1-2 PLIF with T11-L2 arthodesis. PMH includes previous back sxs, bil TKR, COPD, RA, HTN, anxiety.    PT Comments    Pt progressing well with post-op mobility. She was able to demonstrate transfers and ambulation with gross supervision for safety. Min guard provided on the stairs however no physical assistance was required. Pt was educated on precautions, appropriate activity progression, and car transfer. Pt reports she does NOT want to go to SNF but her daughter has concerns with the pt returning home. I do not feel this patient should require SNF level care at d/c based on performance today, but understand the pt's daughter cannot provide physical assistance for her upon d/c. To avoid a SNF stay, the Home First program would be a good option for this patient if she qualifies. Will continue to follow.      Follow Up Recommendations  Supervision for mobility/OOB;Home health PT (Home First if pt qualifies)     Equipment Recommendations  Other (comment) (Rollator if pt can get it (she is 5'2"))    Recommendations for Other Services       Precautions / Restrictions Precautions Precautions: Back;Fall Precaution Booklet Issued: Yes (comment) Precaution Comments: Reviewed during functional mobility.  Required Braces or Orthoses:  (no brace needed per order) Restrictions Weight Bearing Restrictions: No    Mobility  Bed Mobility Overal bed mobility: Needs Assistance Bed Mobility: Rolling;Sidelying to Sit Rolling: Modified independent (Device/Increase time) Sidelying to sit: Supervision       General bed mobility comments: Slow but able to complete with HOB flat and rails lowered to simulate home environment.  Transfers Overall transfer level: Needs assistance Equipment used: Rolling walker (2  wheeled) Transfers: Sit to/from Stand Sit to Stand: Supervision         General transfer comment: Supervision for safety. Pt demonstrated proper hand placement on seated surface for safety.   Ambulation/Gait Ambulation/Gait assistance: Supervision Gait Distance (Feet): 350 Feet Assistive device: Rolling walker (2 wheeled) Gait Pattern/deviations: Step-through pattern;Decreased stride length;Trunk flexed Gait velocity: Decreased Gait velocity interpretation: <1.8 ft/sec, indicate of risk for recurrent falls General Gait Details: Slow but generally steady with RW for support.    Stairs Stairs: Yes Stairs assistance: Min guard Stair Management: One rail Right;Step to pattern;Forwards Number of Stairs: 10 General stair comments: Close guard for safety but no unsteadiness or need for physical assist.    Wheelchair Mobility    Modified Rankin (Stroke Patients Only)       Balance Overall balance assessment: Mild deficits observed, not formally tested                                          Cognition Arousal/Alertness: Awake/alert Behavior During Therapy: WFL for tasks assessed/performed Overall Cognitive Status: Within Functional Limits for tasks assessed                                        Exercises      General Comments        Pertinent Vitals/Pain Pain Assessment: Faces Faces Pain Scale: Hurts little more Pain Location: low back Pain Descriptors / Indicators: Operative site guarding;Grimacing;Moaning Pain Intervention(s): Limited  activity within patient's tolerance;Monitored during session;Repositioned    Home Living Family/patient expects to be discharged to:: Private residence Living Arrangements: Children;Other relatives Available Help at Discharge: Family;Available PRN/intermittently Type of Home: House Home Access: Stairs to enter   Home Layout: Two level Home Equipment: Environmental consultant - 2 wheels;Wheelchair -  manual Additional Comments: Pt plans to stay on first floor as needed, but does not have full bath on first floor.    Prior Function Level of Independence: Independent          PT Goals (current goals can now be found in the care plan section) Acute Rehab PT Goals Patient Stated Goal: return to independence PT Goal Formulation: With patient Time For Goal Achievement: 02/11/20 Potential to Achieve Goals: Good Progress towards PT goals: Progressing toward goals    Frequency    Min 5X/week      PT Plan Discharge plan needs to be updated    Co-evaluation              AM-PAC PT "6 Clicks" Mobility   Outcome Measure  Help needed turning from your back to your side while in a flat bed without using bedrails?: None Help needed moving from lying on your back to sitting on the side of a flat bed without using bedrails?: None Help needed moving to and from a bed to a chair (including a wheelchair)?: None Help needed standing up from a chair using your arms (e.g., wheelchair or bedside chair)?: None Help needed to walk in hospital room?: None Help needed climbing 3-5 steps with a railing? : A Little 6 Click Score: 23    End of Session Equipment Utilized During Treatment: Gait belt Activity Tolerance: Patient tolerated treatment well Patient left: in chair;with call bell/phone within reach Nurse Communication: Mobility status PT Visit Diagnosis: Pain;Difficulty in walking, not elsewhere classified (R26.2) Pain - Right/Left: Left Pain - part of body:  (low back)     Time: 6144-3154 PT Time Calculation (min) (ACUTE ONLY): 26 min  Charges:  $Gait Training: 23-37 mins                     Conni Slipper, PT, DPT Acute Rehabilitation Services Pager: [phone] Office: [phone]    Joy [name] 02/05/2020, 11:02 AM

## 2020-02-05 NOTE — Consult Note (Signed)
   [facility] Inpatient Consult   02/05/2020  Joy [name] 06/08/1941 [id number]  [facility] ACO Patient:  Medicare NextGen  Patient was previously active with [facility] Care Management for chronic disease management services with a [facility] RN Health Coach..Our community based plan of care has focused on disease management and community resource support.    Plan: Patient has been referred to [facility] First program.  Reviewed Inpatient Transition Of Care [TOC] team member notes and  following for disposition.   Of note, [facility] Care Management services does not replace or interfere with any services that are needed or arranged by inpatient [facility] care management team.  For additional questions or referrals please contact:   Charlesetta Shanks, RN BSN CCM [facility]  [phone] business mobile phone Toll free office [phone]  Fax number: [phone] [name]

## 2020-02-05 NOTE — Discharge Summary (Signed)
Physician Discharge Summary  Patient ID: TANIJAH MORAIS MRN: [id number] DOB/AGE: 23-Jul-1941 79 y.o.  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 Hott was admitted and taken to the operating room for a decompression at L1/2, and segmental arthrodesis from T11-L2 with pedicle screws. Post op she is ambulating, her wound is clean, dry, no signs of infection. She is voiding, and tolerating a regular diet.   Treatments: surgery: Lumbar one-two Posterior lumbar interbody fusion 10x51m titanium cages [employer]. Filled with autograft morsels, and same placed in the disc space Thoracic eleven to Lumbar two Posterior arthrodesis allograft morsels Segmental pedicle screw fixation T11- L2 Medtronic instrumentation, 6.35 rods Laminectomy in excess of needed exposure for a plif   Discharge Exam: Blood pressure (!) 116/47, pulse 71, temperature 98.4 F (36.9 C), temperature source Oral, resp. rate 16, height 5' 2"  (1.575 m), weight 82.6 kg, SpO2 97 %. General appearance: alert, cooperative, appears stated age and no distress  Disposition:  Spinal stenosis, Lumbar region with neurogenic claudication  Allergies as of 02/05/2020      Reactions   Codeine Other (See Comments)   Lump in throat   Tape Hives, Rash   Paper tape only   Latex Rash   Nickel Rash   Bumps Also other metals      Medication List    TAKE these medications   albuterol 108 (90 Base) MCG/ACT inhaler Commonly known as: VENTOLIN HFA Inhale 2 puffs into the lungs every 6 (six) hours as needed for wheezing or shortness of breath.   atorvastatin 40 MG tablet Commonly known as: LIPITOR Take 40 mg by mouth daily.   Biotin 1000 MCG tablet Take 1 tablet (1 mg total) by  mouth daily.   folic acid 1 MG tablet Commonly known as: FOLVITE Take 1 tablet (1 mg total) by mouth daily.   gabapentin 300 MG capsule Commonly known as: NEURONTIN Take 300 mg by mouth at bedtime.   ID Now COVID-19 Kit Generic drug: COVID-19 Test See admin instructions. for testing   inFLIXimab in sodium chloride 0.9 % Inject into the vein. Pt receives this infusion every 6 weeks   irbesartan 300 MG tablet Commonly known as: AVAPRO Take 1 tablet (300 mg total) by mouth daily.   Magnesium 250 MG Tabs Take 1 tablet (250 mg total) by mouth daily.   methotrexate (PF) 50 MG/2ML injection INJECT 1 ML ONCE WEEKLY. (25 mg)  DISCARD VIAL AFTER USE   naproxen sodium 220 MG tablet Commonly known as: ALEVE Take 220 mg by mouth 2 (two) times daily as needed (pain).   oxyCODONE 5 MG immediate release tablet Commonly known as: Oxy IR/ROXICODONE Take 5 mg by mouth every 6 (six) hours as needed for severe pain.   pantoprazole 40 MG tablet Commonly known as: PROTONIX Take 1 tablet (40 mg total) by mouth daily.   REMICADE IV Inject 100 mg into the vein every 6 (six) weeks.   sertraline 25 MG tablet Commonly known as: ZOLOFT Take 50 mg by mouth daily.   Systane Balance 0.6 % Soln Generic drug: Propylene Glycol Place 1 drop into both eyes 2 (two) times daily as needed (dry eyes).   tiZANidine 4 MG tablet Commonly known as: ZANAFLEX Take 1 tablet (4 mg total) by mouth every 6 (six) hours as needed for  muscle spasms.   VITAMIN A PO Take 2,400 mcg by mouth daily.   vitamin B-12 1000 MCG tablet Commonly known as: CYANOCOBALAMIN Take 1 tablet (1,000 mcg total) by mouth daily.   Vitamin D3 25 MCG (1000 UT) Caps Take 1 capsule (1,000 Units total) by mouth daily.            Durable Medical Equipment  (From admission, onward)         Start     Ordered   02/04/20 0936  For home use only DME 3 n 1  Once        02/04/20 0936   02/04/20 0935  For home use only DME 4  wheeled rolling walker with seat  Once       Question:  Patient needs a walker to treat with the following condition  Answer:  S/P spinal fusion   02/04/20 0936          Follow-up Information    Ashok Pall, MD Follow up in 3 week(s).   Specialty: Neurosurgery Why: please call to make an appointment for 3 weeks Contact information: [address] [place] [address] [place] [address] [phone]               Signed: Ashok Pall 02/05/2020, 6:57 PM

## 2020-02-05 NOTE — TOC Initial Note (Signed)
Transition of Care [facility]) - Initial/Assessment Note    Patient Details  Name: Joy [name] MRN: [id number] Date of Birth: August 18, 1941  [facility] CM/SW Contact:    Benard Halsted, LCSW Phone Number: 02/05/2020, 11:48 AM  Clinical Narrative:                 CSW received consult for possible home health services at time of discharge. CSW spoke with patient regarding PT recommendation of Home Health PT at time of discharge. Patient reported that she would like home health services. CSW explained that patient may be eligible for the Home First Program through [place]. Patient is in agreement for the program. CSW sent referral for review. Will need Home Health PT/OT/aide orders and Face to Face. Patient received a rollator at bedside from [place]. CSW confirmed PCP and address with patient. Patient states her daughter will come pick her up at discharge, though she is currently working. No further questions reported at this time.    Expected Discharge Plan: [place] Barriers to Discharge: No Barriers Identified   Patient Goals and CMS Choice Patient states their goals for this hospitalization and ongoing recovery are:: Get stronger CMS Medicare.gov Compare Post Acute Care list provided to:: Patient Choice offered to / list presented to : Patient  Expected Discharge Plan and Services Expected Discharge Plan: [place] In-house Referral: Clinical Social Work   Post Acute Care Choice: [place] arrangements for the past 2 months: [place]: PT, OT, Nurse's Aide [place] Agency: [place] Date [place]: 02/05/20 Time [place]: 70 Representative spoke with at [place]: Georgina Snell  Prior Living Arrangements/Services Living arrangements for the past 2 months: [place] Lives with:: Adult Children Patient language and need for interpreter  reviewed:: Yes Do you feel safe going back to the place where you live?: Yes      Need for Family Participation in Patient Care: No (Comment) Care giver support system in place?: Yes (comment)   Criminal Activity/Legal Involvement Pertinent to Current Situation/Hospitalization: No - Comment as needed  Activities of Daily Living Home Assistive Devices/Equipment: Eyeglasses, Dentures (specify type), Blood pressure cuff, Walker (specify type), Wheelchair, Radio producer (specify quad or straight), Scales, Grab bars in shower, Hand-held shower hose ADL Screening (condition at time of admission) Patient's cognitive ability adequate to safely complete daily activities?: Yes Is the patient deaf or have difficulty hearing?: No Does the patient have difficulty seeing, even when wearing glasses/contacts?: No Does the patient have difficulty concentrating, remembering, or making decisions?: No Patient able to express need for assistance with ADLs?: Yes Does the patient have difficulty dressing or bathing?: No Independently performs ADLs?: Yes (appropriate for developmental age) Does the patient have difficulty walking or climbing stairs?: No Weakness of Legs: None Weakness of Arms/Hands: None  Permission Sought/Granted Permission sought to share information with : Facility Art therapist granted to share information with : Yes, Verbal Permission Granted     Permission granted to share info w AGENCY: Home Health        Emotional Assessment Appearance:: Appears stated age Attitude/Demeanor/Rapport: Gracious, Charismatic Affect (typically observed): Accepting, Appropriate, Pleasant Orientation: : Oriented to Self, Oriented to Place, Oriented to  Time, Oriented to Situation Alcohol / Substance Use: Not Applicable  Psych Involvement: No (comment)  Admission diagnosis:  Lumbar adjacent segment disease with spondylolisthesis [M51.36, M43.16] Patient Active Problem List   Diagnosis Date  Noted  . 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  . Prediabetes 02/01/2020  . Class 1 obesity with serious comorbidity and body mass index (BMI) of 33.0 to 33.9 in adult 02/01/2020  . Spinal stenosis of lumbar region 03/04/2019  . Mucopurulent chronic bronchitis (HCC) 08/21/2018  . 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 Acute blood loss as cause of postoperative anemia 06/21/2018  . 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 Spondylosis 06/13/2018  . Status post lumbar spinal fusion 06/13/2018  . Status post lumbar spine surgery for decompression of spinal cord 06/13/2018  . 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 History of total knee arthroplasty, right 09/20/2017  . OA (osteoarthritis) of knee 12/26/2015  . Essential hypertension 11/24/2015  . Hyperlipidemia 11/24/2015  . Rheumatoid arthritis (HCC) 11/24/2015  . Lumbar spondylosis 07/21/2014   PCP:  Juluis Rainier, MD Pharmacy:   [facility] [phone] - [address], [place] - [address] AT [facility] [address] [place] [address] Phone: [phone] Fax: [phone]     Social Determinants of Health (SDOH) Interventions    Readmission Risk Interventions No flowsheet data found.

## 2020-02-06 NOTE — Progress Notes (Signed)
Physical Therapy Treatment Patient Details Name: Joy [name] MRN: [id number] DOB: 02/01/1941 Today's Date: 02/06/2020    History of Present Illness 79 y/o female s/p L1-2 PLIF with T11-L2 arthodesis. PMH includes previous back sxs, bil TKR, COPD, RA, HTN, anxiety.    PT Comments    Pt preparing for d/c home today. Assisted pt with dressing in room in sitting and standing. Pt very steady overall with use of RW. No LOB or physical assistance needed. Pt would continue to benefit from skilled physical therapy services at this time while admitted and after d/c to address the below listed limitations in order to improve overall safety and independence with functional mobility.   Follow Up Recommendations  Home health PT;Supervision for mobility/OOB     Equipment Recommendations  Other (comment) (rollator)    Recommendations for Other Services       Precautions / Restrictions Precautions Precautions: Back;Fall Precaution Booklet Issued: Yes (comment) Precaution Comments: Reviewed during functional mobility.  Required Braces or Orthoses:  (no brace needed per MD order) Restrictions Weight Bearing Restrictions: No    Mobility  Bed Mobility Overal bed mobility: Needs Assistance Bed Mobility: Sidelying to Sit   Sidelying to sit: Supervision       General bed mobility comments: for safety  Transfers Overall transfer level: Needs assistance Equipment used: Rolling walker (2 wheeled) Transfers: Sit to/from Stand Sit to Stand: Supervision         General transfer comment: Supervision for safety. Pt demonstrated proper hand placement on seated surface for safety.   Ambulation/Gait Ambulation/Gait assistance: Supervision Gait Distance (Feet): 20 Feet Assistive device: Rolling walker (2 wheeled) Gait Pattern/deviations: Step-through pattern;Decreased stride length;Trunk flexed Gait velocity: Decreased   General Gait Details: pt steady overall with RW ambulating in  room   Stairs             Wheelchair Mobility    Modified Rankin (Stroke Patients Only)       Balance Overall balance assessment: Mild deficits observed, not formally tested                                          Cognition Arousal/Alertness: Awake/alert Behavior During Therapy: WFL for tasks assessed/performed Overall Cognitive Status: Within Functional Limits for tasks assessed                                        Exercises Other Exercises Other Exercises: assisted pt with dressing in sitting EOB and standing    General Comments        Pertinent Vitals/Pain Pain Assessment: Faces Faces Pain Scale: Hurts a little bit Pain Location: low back Pain Descriptors / Indicators: Guarding Pain Intervention(s): Monitored during session;Repositioned    Home Living                      Prior Function            PT Goals (current goals can now be found in the care plan section) Acute Rehab PT Goals PT Goal Formulation: With patient Time For Goal Achievement: 02/11/20 Potential to Achieve Goals: Good Progress towards PT goals: Progressing toward goals    Frequency    Min 5X/week      PT Plan Current plan remains appropriate    Co-evaluation  AM-PAC PT "6 Clicks" Mobility   Outcome Measure  Help needed turning from your back to your side while in a flat bed without using bedrails?: None Help needed moving from lying on your back to sitting on the side of a flat bed without using bedrails?: None Help needed moving to and from a bed to a chair (including a wheelchair)?: None Help needed standing up from a chair using your arms (e.g., wheelchair or bedside chair)?: None Help needed to walk in hospital room?: None Help needed climbing 3-5 steps with a railing? : A Little 6 Click Score: 23    End of Session Equipment Utilized During Treatment: Back brace Activity Tolerance: Patient tolerated  treatment well Patient left: with call bell/phone within reach;Other (comment) (in bathroom) Nurse Communication: Mobility status PT Visit Diagnosis: Pain;Difficulty in walking, not elsewhere classified (R26.2) Pain - Right/Left: Left Pain - part of body:  (lower back)     Time: 1761-6073 PT Time Calculation (min) (ACUTE ONLY): 11 min  Charges:  $Therapeutic Activity: 8-22 mins                     Anastasio Champion, DPT  Acute Rehabilitation Services Pager [phone] Office [place] 02/06/2020, 9:23 AM

## 2020-02-06 NOTE — Progress Notes (Signed)
Patient is discharged from room 3C02 at this time. Alert and in stable condition. IV site d/c'd and instructions read to patient and daughter with understanding verbalized and all questions answered. Left unit via wheelchair with all belongings at side. 

## 2020-02-07 DIAGNOSIS — I10 Essential (primary) hypertension: Secondary | ICD-10-CM | POA: Diagnosis not present

## 2020-02-07 DIAGNOSIS — J302 Other seasonal allergic rhinitis: Secondary | ICD-10-CM | POA: Diagnosis not present

## 2020-02-07 DIAGNOSIS — M4316 Spondylolisthesis, lumbar region: Secondary | ICD-10-CM | POA: Diagnosis not present

## 2020-02-07 DIAGNOSIS — R7303 Prediabetes: Secondary | ICD-10-CM | POA: Diagnosis not present

## 2020-02-07 DIAGNOSIS — H04129 Dry eye syndrome of unspecified lacrimal gland: Secondary | ICD-10-CM | POA: Diagnosis not present

## 2020-02-07 DIAGNOSIS — Z6831 Body mass index (BMI) 31.0-31.9, adult: Secondary | ICD-10-CM | POA: Diagnosis not present

## 2020-02-07 DIAGNOSIS — Z4789 Encounter for other orthopedic aftercare: Secondary | ICD-10-CM | POA: Diagnosis not present

## 2020-02-07 DIAGNOSIS — M810 Age-related osteoporosis without current pathological fracture: Secondary | ICD-10-CM | POA: Diagnosis not present

## 2020-02-07 DIAGNOSIS — K219 Gastro-esophageal reflux disease without esophagitis: Secondary | ICD-10-CM | POA: Diagnosis not present

## 2020-02-07 DIAGNOSIS — G5600 Carpal tunnel syndrome, unspecified upper limb: Secondary | ICD-10-CM | POA: Diagnosis not present

## 2020-02-07 DIAGNOSIS — M069 Rheumatoid arthritis, unspecified: Secondary | ICD-10-CM | POA: Diagnosis not present

## 2020-02-07 DIAGNOSIS — M48062 Spinal stenosis, lumbar region with neurogenic claudication: Secondary | ICD-10-CM | POA: Diagnosis not present

## 2020-02-07 DIAGNOSIS — E669 Obesity, unspecified: Secondary | ICD-10-CM | POA: Diagnosis not present

## 2020-02-07 DIAGNOSIS — E7800 Pure hypercholesterolemia, unspecified: Secondary | ICD-10-CM | POA: Diagnosis not present

## 2020-02-07 DIAGNOSIS — N3281 Overactive bladder: Secondary | ICD-10-CM | POA: Diagnosis not present

## 2020-02-07 DIAGNOSIS — Z87891 Personal history of nicotine dependence: Secondary | ICD-10-CM | POA: Diagnosis not present

## 2020-02-07 DIAGNOSIS — J449 Chronic obstructive pulmonary disease, unspecified: Secondary | ICD-10-CM | POA: Diagnosis not present

## 2020-02-07 DIAGNOSIS — M5430 Sciatica, unspecified side: Secondary | ICD-10-CM | POA: Diagnosis not present

## 2020-02-07 DIAGNOSIS — G629 Polyneuropathy, unspecified: Secondary | ICD-10-CM | POA: Diagnosis not present

## 2020-02-07 DIAGNOSIS — E785 Hyperlipidemia, unspecified: Secondary | ICD-10-CM | POA: Diagnosis not present

## 2020-02-07 DIAGNOSIS — M459 Ankylosing spondylitis of unspecified sites in spine: Secondary | ICD-10-CM | POA: Diagnosis not present

## 2020-02-07 DIAGNOSIS — Z96653 Presence of artificial knee joint, bilateral: Secondary | ICD-10-CM | POA: Diagnosis not present

## 2020-02-07 DIAGNOSIS — K581 Irritable bowel syndrome with constipation: Secondary | ICD-10-CM | POA: Diagnosis not present

## 2020-02-07 DIAGNOSIS — M5126 Other intervertebral disc displacement, lumbar region: Secondary | ICD-10-CM | POA: Diagnosis not present

## 2020-02-07 DIAGNOSIS — F419 Anxiety disorder, unspecified: Secondary | ICD-10-CM | POA: Diagnosis not present

## 2020-02-08 DIAGNOSIS — F419 Anxiety disorder, unspecified: Secondary | ICD-10-CM | POA: Diagnosis not present

## 2020-02-08 DIAGNOSIS — M4316 Spondylolisthesis, lumbar region: Secondary | ICD-10-CM | POA: Diagnosis not present

## 2020-02-08 DIAGNOSIS — M48062 Spinal stenosis, lumbar region with neurogenic claudication: Secondary | ICD-10-CM | POA: Diagnosis not present

## 2020-02-08 DIAGNOSIS — Z4789 Encounter for other orthopedic aftercare: Secondary | ICD-10-CM | POA: Diagnosis not present

## 2020-02-08 DIAGNOSIS — I10 Essential (primary) hypertension: Secondary | ICD-10-CM | POA: Diagnosis not present

## 2020-02-08 DIAGNOSIS — M5126 Other intervertebral disc displacement, lumbar region: Secondary | ICD-10-CM | POA: Diagnosis not present

## 2020-02-08 LAB — TYPE AND SCREEN
ABO/RH(D): A POS
Antibody Screen: NEGATIVE
Donor AG Type: NEGATIVE
Donor AG Type: NEGATIVE
Unit division: 0
Unit division: 0

## 2020-02-08 LAB — BPAM RBC
Blood Product Expiration Date: 202106272359
Blood Product Expiration Date: 202106292359
Unit Type and Rh: 600
Unit Type and Rh: 9500

## 2020-02-09 NOTE — Progress Notes (Signed)
Pt attempted - unable to reach °

## 2020-02-10 MED FILL — Sodium Chloride IV Soln 0.9%: INTRAVENOUS | Qty: 1000 | Status: AC

## 2020-02-10 MED FILL — Heparin Sodium (Porcine) Inj 1000 Unit/ML: INTRAMUSCULAR | Qty: 30 | Status: AC

## 2020-02-11 DIAGNOSIS — M48062 Spinal stenosis, lumbar region with neurogenic claudication: Secondary | ICD-10-CM | POA: Diagnosis not present

## 2020-02-11 DIAGNOSIS — M5126 Other intervertebral disc displacement, lumbar region: Secondary | ICD-10-CM | POA: Diagnosis not present

## 2020-02-11 DIAGNOSIS — Z4789 Encounter for other orthopedic aftercare: Secondary | ICD-10-CM | POA: Diagnosis not present

## 2020-02-11 DIAGNOSIS — M4316 Spondylolisthesis, lumbar region: Secondary | ICD-10-CM | POA: Diagnosis not present

## 2020-02-11 DIAGNOSIS — I10 Essential (primary) hypertension: Secondary | ICD-10-CM | POA: Diagnosis not present

## 2020-02-11 DIAGNOSIS — F419 Anxiety disorder, unspecified: Secondary | ICD-10-CM | POA: Diagnosis not present

## 2020-02-16 DIAGNOSIS — M4316 Spondylolisthesis, lumbar region: Secondary | ICD-10-CM | POA: Diagnosis not present

## 2020-02-16 DIAGNOSIS — M48062 Spinal stenosis, lumbar region with neurogenic claudication: Secondary | ICD-10-CM | POA: Diagnosis not present

## 2020-02-16 DIAGNOSIS — M5126 Other intervertebral disc displacement, lumbar region: Secondary | ICD-10-CM | POA: Diagnosis not present

## 2020-02-16 DIAGNOSIS — F419 Anxiety disorder, unspecified: Secondary | ICD-10-CM | POA: Diagnosis not present

## 2020-02-16 DIAGNOSIS — Z4789 Encounter for other orthopedic aftercare: Secondary | ICD-10-CM | POA: Diagnosis not present

## 2020-02-16 DIAGNOSIS — I10 Essential (primary) hypertension: Secondary | ICD-10-CM | POA: Diagnosis not present

## 2020-02-18 DIAGNOSIS — M4316 Spondylolisthesis, lumbar region: Secondary | ICD-10-CM | POA: Diagnosis not present

## 2020-02-18 DIAGNOSIS — Z4789 Encounter for other orthopedic aftercare: Secondary | ICD-10-CM | POA: Diagnosis not present

## 2020-02-18 DIAGNOSIS — I10 Essential (primary) hypertension: Secondary | ICD-10-CM | POA: Diagnosis not present

## 2020-02-18 DIAGNOSIS — M5126 Other intervertebral disc displacement, lumbar region: Secondary | ICD-10-CM | POA: Diagnosis not present

## 2020-02-18 DIAGNOSIS — F419 Anxiety disorder, unspecified: Secondary | ICD-10-CM | POA: Diagnosis not present

## 2020-02-18 DIAGNOSIS — M48062 Spinal stenosis, lumbar region with neurogenic claudication: Secondary | ICD-10-CM | POA: Diagnosis not present

## 2020-02-19 DIAGNOSIS — M5126 Other intervertebral disc displacement, lumbar region: Secondary | ICD-10-CM | POA: Diagnosis not present

## 2020-02-19 DIAGNOSIS — I10 Essential (primary) hypertension: Secondary | ICD-10-CM | POA: Diagnosis not present

## 2020-02-19 DIAGNOSIS — M48062 Spinal stenosis, lumbar region with neurogenic claudication: Secondary | ICD-10-CM | POA: Diagnosis not present

## 2020-02-19 DIAGNOSIS — F419 Anxiety disorder, unspecified: Secondary | ICD-10-CM | POA: Diagnosis not present

## 2020-02-19 DIAGNOSIS — M4316 Spondylolisthesis, lumbar region: Secondary | ICD-10-CM | POA: Diagnosis not present

## 2020-02-19 DIAGNOSIS — Z4789 Encounter for other orthopedic aftercare: Secondary | ICD-10-CM | POA: Diagnosis not present

## 2020-02-24 DIAGNOSIS — Z4789 Encounter for other orthopedic aftercare: Secondary | ICD-10-CM | POA: Diagnosis not present

## 2020-02-24 DIAGNOSIS — M48062 Spinal stenosis, lumbar region with neurogenic claudication: Secondary | ICD-10-CM | POA: Diagnosis not present

## 2020-02-24 DIAGNOSIS — F419 Anxiety disorder, unspecified: Secondary | ICD-10-CM | POA: Diagnosis not present

## 2020-02-24 DIAGNOSIS — M5126 Other intervertebral disc displacement, lumbar region: Secondary | ICD-10-CM | POA: Diagnosis not present

## 2020-02-24 DIAGNOSIS — M4316 Spondylolisthesis, lumbar region: Secondary | ICD-10-CM | POA: Diagnosis not present

## 2020-02-24 DIAGNOSIS — I10 Essential (primary) hypertension: Secondary | ICD-10-CM | POA: Diagnosis not present

## 2020-02-29 ENCOUNTER — Ambulatory Visit (INDEPENDENT_AMBULATORY_CARE_PROVIDER_SITE_OTHER): Payer: Medicare Other | Admitting: Adult Health

## 2020-03-01 DIAGNOSIS — M5126 Other intervertebral disc displacement, lumbar region: Secondary | ICD-10-CM | POA: Diagnosis not present

## 2020-03-01 DIAGNOSIS — M48062 Spinal stenosis, lumbar region with neurogenic claudication: Secondary | ICD-10-CM | POA: Diagnosis not present

## 2020-03-01 DIAGNOSIS — I10 Essential (primary) hypertension: Secondary | ICD-10-CM | POA: Diagnosis not present

## 2020-03-01 DIAGNOSIS — F419 Anxiety disorder, unspecified: Secondary | ICD-10-CM | POA: Diagnosis not present

## 2020-03-01 DIAGNOSIS — M4316 Spondylolisthesis, lumbar region: Secondary | ICD-10-CM | POA: Diagnosis not present

## 2020-03-01 DIAGNOSIS — Z4789 Encounter for other orthopedic aftercare: Secondary | ICD-10-CM | POA: Diagnosis not present

## 2020-03-08 DIAGNOSIS — G5600 Carpal tunnel syndrome, unspecified upper limb: Secondary | ICD-10-CM | POA: Diagnosis not present

## 2020-03-08 DIAGNOSIS — E669 Obesity, unspecified: Secondary | ICD-10-CM | POA: Diagnosis not present

## 2020-03-08 DIAGNOSIS — J449 Chronic obstructive pulmonary disease, unspecified: Secondary | ICD-10-CM | POA: Diagnosis not present

## 2020-03-08 DIAGNOSIS — M5430 Sciatica, unspecified side: Secondary | ICD-10-CM | POA: Diagnosis not present

## 2020-03-08 DIAGNOSIS — M5126 Other intervertebral disc displacement, lumbar region: Secondary | ICD-10-CM | POA: Diagnosis not present

## 2020-03-08 DIAGNOSIS — E7800 Pure hypercholesterolemia, unspecified: Secondary | ICD-10-CM | POA: Diagnosis not present

## 2020-03-08 DIAGNOSIS — G629 Polyneuropathy, unspecified: Secondary | ICD-10-CM | POA: Diagnosis not present

## 2020-03-08 DIAGNOSIS — J302 Other seasonal allergic rhinitis: Secondary | ICD-10-CM | POA: Diagnosis not present

## 2020-03-08 DIAGNOSIS — M069 Rheumatoid arthritis, unspecified: Secondary | ICD-10-CM | POA: Diagnosis not present

## 2020-03-08 DIAGNOSIS — M4316 Spondylolisthesis, lumbar region: Secondary | ICD-10-CM | POA: Diagnosis not present

## 2020-03-08 DIAGNOSIS — F419 Anxiety disorder, unspecified: Secondary | ICD-10-CM | POA: Diagnosis not present

## 2020-03-08 DIAGNOSIS — K581 Irritable bowel syndrome with constipation: Secondary | ICD-10-CM | POA: Diagnosis not present

## 2020-03-08 DIAGNOSIS — K219 Gastro-esophageal reflux disease without esophagitis: Secondary | ICD-10-CM | POA: Diagnosis not present

## 2020-03-08 DIAGNOSIS — N3281 Overactive bladder: Secondary | ICD-10-CM | POA: Diagnosis not present

## 2020-03-08 DIAGNOSIS — Z87891 Personal history of nicotine dependence: Secondary | ICD-10-CM | POA: Diagnosis not present

## 2020-03-08 DIAGNOSIS — M459 Ankylosing spondylitis of unspecified sites in spine: Secondary | ICD-10-CM | POA: Diagnosis not present

## 2020-03-08 DIAGNOSIS — M48062 Spinal stenosis, lumbar region with neurogenic claudication: Secondary | ICD-10-CM | POA: Diagnosis not present

## 2020-03-08 DIAGNOSIS — R7303 Prediabetes: Secondary | ICD-10-CM | POA: Diagnosis not present

## 2020-03-08 DIAGNOSIS — Z96653 Presence of artificial knee joint, bilateral: Secondary | ICD-10-CM | POA: Diagnosis not present

## 2020-03-08 DIAGNOSIS — E785 Hyperlipidemia, unspecified: Secondary | ICD-10-CM | POA: Diagnosis not present

## 2020-03-08 DIAGNOSIS — Z6831 Body mass index (BMI) 31.0-31.9, adult: Secondary | ICD-10-CM | POA: Diagnosis not present

## 2020-03-08 DIAGNOSIS — I10 Essential (primary) hypertension: Secondary | ICD-10-CM | POA: Diagnosis not present

## 2020-03-08 DIAGNOSIS — Z4789 Encounter for other orthopedic aftercare: Secondary | ICD-10-CM | POA: Diagnosis not present

## 2020-03-08 DIAGNOSIS — M810 Age-related osteoporosis without current pathological fracture: Secondary | ICD-10-CM | POA: Diagnosis not present

## 2020-03-08 DIAGNOSIS — H04129 Dry eye syndrome of unspecified lacrimal gland: Secondary | ICD-10-CM | POA: Diagnosis not present

## 2020-03-17 DIAGNOSIS — M4316 Spondylolisthesis, lumbar region: Secondary | ICD-10-CM | POA: Diagnosis not present

## 2020-03-17 DIAGNOSIS — F419 Anxiety disorder, unspecified: Secondary | ICD-10-CM | POA: Diagnosis not present

## 2020-03-17 DIAGNOSIS — M5126 Other intervertebral disc displacement, lumbar region: Secondary | ICD-10-CM | POA: Diagnosis not present

## 2020-03-17 DIAGNOSIS — M0589 Other rheumatoid arthritis with rheumatoid factor of multiple sites: Secondary | ICD-10-CM | POA: Diagnosis not present

## 2020-03-17 DIAGNOSIS — I10 Essential (primary) hypertension: Secondary | ICD-10-CM | POA: Diagnosis not present

## 2020-03-17 DIAGNOSIS — Z4789 Encounter for other orthopedic aftercare: Secondary | ICD-10-CM | POA: Diagnosis not present

## 2020-03-17 DIAGNOSIS — M48062 Spinal stenosis, lumbar region with neurogenic claudication: Secondary | ICD-10-CM | POA: Diagnosis not present

## 2020-03-21 ENCOUNTER — Ambulatory Visit: Payer: Self-pay | Admitting: *Deleted

## 2020-03-22 DIAGNOSIS — E7800 Pure hypercholesterolemia, unspecified: Secondary | ICD-10-CM | POA: Diagnosis not present

## 2020-03-22 DIAGNOSIS — M810 Age-related osteoporosis without current pathological fracture: Secondary | ICD-10-CM | POA: Diagnosis not present

## 2020-03-22 DIAGNOSIS — M069 Rheumatoid arthritis, unspecified: Secondary | ICD-10-CM | POA: Diagnosis not present

## 2020-03-22 DIAGNOSIS — F419 Anxiety disorder, unspecified: Secondary | ICD-10-CM | POA: Diagnosis not present

## 2020-03-22 DIAGNOSIS — R748 Abnormal levels of other serum enzymes: Secondary | ICD-10-CM | POA: Diagnosis not present

## 2020-03-22 DIAGNOSIS — N6459 Other signs and symptoms in breast: Secondary | ICD-10-CM | POA: Diagnosis not present

## 2020-03-22 DIAGNOSIS — I10 Essential (primary) hypertension: Secondary | ICD-10-CM | POA: Diagnosis not present

## 2020-03-22 DIAGNOSIS — Z0000 Encounter for general adult medical examination without abnormal findings: Secondary | ICD-10-CM | POA: Diagnosis not present

## 2020-03-22 DIAGNOSIS — E559 Vitamin D deficiency, unspecified: Secondary | ICD-10-CM | POA: Diagnosis not present

## 2020-03-22 DIAGNOSIS — R7989 Other specified abnormal findings of blood chemistry: Secondary | ICD-10-CM | POA: Diagnosis not present

## 2020-03-22 DIAGNOSIS — R109 Unspecified abdominal pain: Secondary | ICD-10-CM | POA: Diagnosis not present

## 2020-03-22 DIAGNOSIS — R7303 Prediabetes: Secondary | ICD-10-CM | POA: Diagnosis not present

## 2020-04-05 DIAGNOSIS — E669 Obesity, unspecified: Secondary | ICD-10-CM | POA: Diagnosis not present

## 2020-04-05 DIAGNOSIS — R194 Change in bowel habit: Secondary | ICD-10-CM | POA: Diagnosis not present

## 2020-04-05 DIAGNOSIS — K219 Gastro-esophageal reflux disease without esophagitis: Secondary | ICD-10-CM | POA: Diagnosis not present

## 2020-04-05 DIAGNOSIS — K5900 Constipation, unspecified: Secondary | ICD-10-CM | POA: Diagnosis not present

## 2020-04-21 DIAGNOSIS — H43813 Vitreous degeneration, bilateral: Secondary | ICD-10-CM | POA: Diagnosis not present

## 2020-04-21 DIAGNOSIS — H16223 Keratoconjunctivitis sicca, not specified as Sjogren's, bilateral: Secondary | ICD-10-CM | POA: Diagnosis not present

## 2020-04-21 DIAGNOSIS — H5711 Ocular pain, right eye: Secondary | ICD-10-CM | POA: Diagnosis not present

## 2020-04-21 DIAGNOSIS — Z961 Presence of intraocular lens: Secondary | ICD-10-CM | POA: Diagnosis not present

## 2020-04-21 DIAGNOSIS — R519 Headache, unspecified: Secondary | ICD-10-CM | POA: Diagnosis not present

## 2020-04-28 DIAGNOSIS — Z79899 Other long term (current) drug therapy: Secondary | ICD-10-CM | POA: Diagnosis not present

## 2020-04-28 DIAGNOSIS — M0589 Other rheumatoid arthritis with rheumatoid factor of multiple sites: Secondary | ICD-10-CM | POA: Diagnosis not present

## 2020-05-04 DIAGNOSIS — M2550 Pain in unspecified joint: Secondary | ICD-10-CM | POA: Diagnosis not present

## 2020-05-04 DIAGNOSIS — Z6834 Body mass index (BMI) 34.0-34.9, adult: Secondary | ICD-10-CM | POA: Diagnosis not present

## 2020-05-04 DIAGNOSIS — Z79899 Other long term (current) drug therapy: Secondary | ICD-10-CM | POA: Diagnosis not present

## 2020-05-04 DIAGNOSIS — Z1589 Genetic susceptibility to other disease: Secondary | ICD-10-CM | POA: Diagnosis not present

## 2020-05-04 DIAGNOSIS — M0589 Other rheumatoid arthritis with rheumatoid factor of multiple sites: Secondary | ICD-10-CM | POA: Diagnosis not present

## 2020-05-04 DIAGNOSIS — E669 Obesity, unspecified: Secondary | ICD-10-CM | POA: Diagnosis not present

## 2020-05-04 DIAGNOSIS — M150 Primary generalized (osteo)arthritis: Secondary | ICD-10-CM | POA: Diagnosis not present

## 2020-05-14 DIAGNOSIS — Z23 Encounter for immunization: Secondary | ICD-10-CM | POA: Diagnosis not present

## 2020-05-19 DIAGNOSIS — M85851 Other specified disorders of bone density and structure, right thigh: Secondary | ICD-10-CM | POA: Diagnosis not present

## 2020-05-19 DIAGNOSIS — Z780 Asymptomatic menopausal state: Secondary | ICD-10-CM | POA: Diagnosis not present

## 2020-05-19 DIAGNOSIS — N6001 Solitary cyst of right breast: Secondary | ICD-10-CM | POA: Diagnosis not present

## 2020-05-19 DIAGNOSIS — M810 Age-related osteoporosis without current pathological fracture: Secondary | ICD-10-CM | POA: Diagnosis not present

## 2020-05-19 DIAGNOSIS — R928 Other abnormal and inconclusive findings on diagnostic imaging of breast: Secondary | ICD-10-CM | POA: Diagnosis not present

## 2020-05-24 DIAGNOSIS — R7301 Impaired fasting glucose: Secondary | ICD-10-CM | POA: Diagnosis not present

## 2020-05-24 DIAGNOSIS — Z23 Encounter for immunization: Secondary | ICD-10-CM | POA: Diagnosis not present

## 2020-05-24 DIAGNOSIS — E7800 Pure hypercholesterolemia, unspecified: Secondary | ICD-10-CM | POA: Diagnosis not present

## 2020-05-24 DIAGNOSIS — R519 Headache, unspecified: Secondary | ICD-10-CM | POA: Diagnosis not present

## 2020-05-24 DIAGNOSIS — E559 Vitamin D deficiency, unspecified: Secondary | ICD-10-CM | POA: Diagnosis not present

## 2020-06-09 DIAGNOSIS — M0589 Other rheumatoid arthritis with rheumatoid factor of multiple sites: Secondary | ICD-10-CM | POA: Diagnosis not present

## 2020-06-09 DIAGNOSIS — Z79899 Other long term (current) drug therapy: Secondary | ICD-10-CM | POA: Diagnosis not present

## 2020-06-28 DIAGNOSIS — M4316 Spondylolisthesis, lumbar region: Secondary | ICD-10-CM | POA: Diagnosis not present

## 2020-06-28 DIAGNOSIS — M48062 Spinal stenosis, lumbar region with neurogenic claudication: Secondary | ICD-10-CM | POA: Diagnosis not present

## 2020-07-18 DIAGNOSIS — M25559 Pain in unspecified hip: Secondary | ICD-10-CM | POA: Diagnosis not present

## 2020-07-18 DIAGNOSIS — R070 Pain in throat: Secondary | ICD-10-CM | POA: Diagnosis not present

## 2020-07-18 DIAGNOSIS — G629 Polyneuropathy, unspecified: Secondary | ICD-10-CM | POA: Diagnosis not present

## 2020-07-21 DIAGNOSIS — M0589 Other rheumatoid arthritis with rheumatoid factor of multiple sites: Secondary | ICD-10-CM | POA: Diagnosis not present

## 2020-08-05 DIAGNOSIS — R1312 Dysphagia, oropharyngeal phase: Secondary | ICD-10-CM | POA: Diagnosis not present

## 2020-08-05 DIAGNOSIS — K219 Gastro-esophageal reflux disease without esophagitis: Secondary | ICD-10-CM | POA: Diagnosis not present

## 2020-08-05 DIAGNOSIS — R42 Dizziness and giddiness: Secondary | ICD-10-CM | POA: Diagnosis not present

## 2020-08-05 DIAGNOSIS — R070 Pain in throat: Secondary | ICD-10-CM | POA: Diagnosis not present

## 2020-08-10 DIAGNOSIS — M25552 Pain in left hip: Secondary | ICD-10-CM | POA: Insufficient documentation

## 2020-08-15 DIAGNOSIS — M7062 Trochanteric bursitis, left hip: Secondary | ICD-10-CM | POA: Diagnosis not present

## 2020-08-15 DIAGNOSIS — M7061 Trochanteric bursitis, right hip: Secondary | ICD-10-CM | POA: Diagnosis not present

## 2020-08-25 DIAGNOSIS — H43813 Vitreous degeneration, bilateral: Secondary | ICD-10-CM | POA: Diagnosis not present

## 2020-08-25 DIAGNOSIS — H524 Presbyopia: Secondary | ICD-10-CM | POA: Diagnosis not present

## 2020-08-25 DIAGNOSIS — H5711 Ocular pain, right eye: Secondary | ICD-10-CM | POA: Diagnosis not present

## 2020-08-25 DIAGNOSIS — Z961 Presence of intraocular lens: Secondary | ICD-10-CM | POA: Diagnosis not present

## 2020-08-25 DIAGNOSIS — H16223 Keratoconjunctivitis sicca, not specified as Sjogren's, bilateral: Secondary | ICD-10-CM | POA: Diagnosis not present

## 2020-09-09 DIAGNOSIS — M0589 Other rheumatoid arthritis with rheumatoid factor of multiple sites: Secondary | ICD-10-CM | POA: Diagnosis not present

## 2020-09-13 DIAGNOSIS — Z6835 Body mass index (BMI) 35.0-35.9, adult: Secondary | ICD-10-CM | POA: Diagnosis not present

## 2020-09-13 DIAGNOSIS — M4316 Spondylolisthesis, lumbar region: Secondary | ICD-10-CM | POA: Diagnosis not present

## 2020-09-13 DIAGNOSIS — M48062 Spinal stenosis, lumbar region with neurogenic claudication: Secondary | ICD-10-CM | POA: Diagnosis not present

## 2020-09-13 DIAGNOSIS — R030 Elevated blood-pressure reading, without diagnosis of hypertension: Secondary | ICD-10-CM | POA: Diagnosis not present

## 2020-09-16 ENCOUNTER — Other Ambulatory Visit (HOSPITAL_COMMUNITY): Payer: Self-pay | Admitting: Otolaryngology

## 2020-09-16 DIAGNOSIS — R42 Dizziness and giddiness: Secondary | ICD-10-CM | POA: Diagnosis not present

## 2020-09-16 DIAGNOSIS — R1312 Dysphagia, oropharyngeal phase: Secondary | ICD-10-CM | POA: Diagnosis not present

## 2020-09-16 DIAGNOSIS — K219 Gastro-esophageal reflux disease without esophagitis: Secondary | ICD-10-CM | POA: Diagnosis not present

## 2020-09-16 DIAGNOSIS — R1310 Dysphagia, unspecified: Secondary | ICD-10-CM

## 2020-09-16 DIAGNOSIS — R519 Headache, unspecified: Secondary | ICD-10-CM | POA: Diagnosis not present

## 2020-09-21 ENCOUNTER — Other Ambulatory Visit: Payer: Self-pay

## 2020-09-21 ENCOUNTER — Ambulatory Visit (HOSPITAL_COMMUNITY)
Admission: RE | Admit: 2020-09-21 | Discharge: 2020-09-21 | Disposition: A | Discharge status: Home / Self Care | Payer: Medicare Other | Source: Ambulatory Visit | Attending: Otolaryngology | Admitting: Otolaryngology

## 2020-09-21 DIAGNOSIS — K224 Dyskinesia of esophagus: Secondary | ICD-10-CM | POA: Diagnosis not present

## 2020-09-21 DIAGNOSIS — R1310 Dysphagia, unspecified: Secondary | ICD-10-CM | POA: Diagnosis not present

## 2020-09-28 DIAGNOSIS — M25551 Pain in right hip: Secondary | ICD-10-CM | POA: Diagnosis not present

## 2020-10-03 DIAGNOSIS — Z7409 Other reduced mobility: Secondary | ICD-10-CM | POA: Diagnosis not present

## 2020-10-12 DIAGNOSIS — M7061 Trochanteric bursitis, right hip: Secondary | ICD-10-CM | POA: Diagnosis not present

## 2020-10-12 DIAGNOSIS — Z7409 Other reduced mobility: Secondary | ICD-10-CM | POA: Diagnosis not present

## 2020-10-12 DIAGNOSIS — R29898 Other symptoms and signs involving the musculoskeletal system: Secondary | ICD-10-CM | POA: Diagnosis not present

## 2020-10-20 DIAGNOSIS — I10 Essential (primary) hypertension: Secondary | ICD-10-CM | POA: Diagnosis not present

## 2020-10-20 DIAGNOSIS — R350 Frequency of micturition: Secondary | ICD-10-CM | POA: Diagnosis not present

## 2020-10-20 DIAGNOSIS — R7301 Impaired fasting glucose: Secondary | ICD-10-CM | POA: Diagnosis not present

## 2020-10-20 DIAGNOSIS — R2681 Unsteadiness on feet: Secondary | ICD-10-CM | POA: Diagnosis not present

## 2020-10-20 DIAGNOSIS — E7800 Pure hypercholesterolemia, unspecified: Secondary | ICD-10-CM | POA: Diagnosis not present

## 2020-10-20 DIAGNOSIS — M7061 Trochanteric bursitis, right hip: Secondary | ICD-10-CM | POA: Diagnosis not present

## 2020-10-20 DIAGNOSIS — K219 Gastro-esophageal reflux disease without esophagitis: Secondary | ICD-10-CM | POA: Diagnosis not present

## 2020-10-20 DIAGNOSIS — R29898 Other symptoms and signs involving the musculoskeletal system: Secondary | ICD-10-CM | POA: Diagnosis not present

## 2020-10-20 DIAGNOSIS — M25559 Pain in unspecified hip: Secondary | ICD-10-CM | POA: Diagnosis not present

## 2020-10-20 DIAGNOSIS — M069 Rheumatoid arthritis, unspecified: Secondary | ICD-10-CM | POA: Diagnosis not present

## 2020-10-20 DIAGNOSIS — Z7409 Other reduced mobility: Secondary | ICD-10-CM | POA: Diagnosis not present

## 2020-10-20 DIAGNOSIS — M6281 Muscle weakness (generalized): Secondary | ICD-10-CM | POA: Diagnosis not present

## 2020-10-20 DIAGNOSIS — G629 Polyneuropathy, unspecified: Secondary | ICD-10-CM | POA: Diagnosis not present

## 2020-10-20 DIAGNOSIS — K5900 Constipation, unspecified: Secondary | ICD-10-CM | POA: Diagnosis not present

## 2020-10-20 DIAGNOSIS — F419 Anxiety disorder, unspecified: Secondary | ICD-10-CM | POA: Diagnosis not present

## 2020-10-20 DIAGNOSIS — E559 Vitamin D deficiency, unspecified: Secondary | ICD-10-CM | POA: Diagnosis not present

## 2020-10-21 DIAGNOSIS — M0589 Other rheumatoid arthritis with rheumatoid factor of multiple sites: Secondary | ICD-10-CM | POA: Diagnosis not present

## 2020-10-21 DIAGNOSIS — Z79899 Other long term (current) drug therapy: Secondary | ICD-10-CM | POA: Diagnosis not present

## 2020-11-01 DIAGNOSIS — E669 Obesity, unspecified: Secondary | ICD-10-CM | POA: Diagnosis not present

## 2020-11-01 DIAGNOSIS — M150 Primary generalized (osteo)arthritis: Secondary | ICD-10-CM | POA: Diagnosis not present

## 2020-11-01 DIAGNOSIS — Z6836 Body mass index (BMI) 36.0-36.9, adult: Secondary | ICD-10-CM | POA: Diagnosis not present

## 2020-11-01 DIAGNOSIS — B370 Candidal stomatitis: Secondary | ICD-10-CM | POA: Diagnosis not present

## 2020-11-01 DIAGNOSIS — Z79899 Other long term (current) drug therapy: Secondary | ICD-10-CM | POA: Diagnosis not present

## 2020-11-01 DIAGNOSIS — Z1589 Genetic susceptibility to other disease: Secondary | ICD-10-CM | POA: Diagnosis not present

## 2020-11-01 DIAGNOSIS — M2550 Pain in unspecified joint: Secondary | ICD-10-CM | POA: Diagnosis not present

## 2020-11-01 DIAGNOSIS — M0589 Other rheumatoid arthritis with rheumatoid factor of multiple sites: Secondary | ICD-10-CM | POA: Diagnosis not present

## 2020-11-02 DIAGNOSIS — R519 Headache, unspecified: Secondary | ICD-10-CM | POA: Diagnosis not present

## 2020-11-02 DIAGNOSIS — Z049 Encounter for examination and observation for unspecified reason: Secondary | ICD-10-CM | POA: Diagnosis not present

## 2020-11-03 DIAGNOSIS — M6281 Muscle weakness (generalized): Secondary | ICD-10-CM | POA: Diagnosis not present

## 2020-11-03 DIAGNOSIS — Z7409 Other reduced mobility: Secondary | ICD-10-CM | POA: Diagnosis not present

## 2020-11-03 DIAGNOSIS — R29898 Other symptoms and signs involving the musculoskeletal system: Secondary | ICD-10-CM | POA: Diagnosis not present

## 2020-11-03 DIAGNOSIS — M7061 Trochanteric bursitis, right hip: Secondary | ICD-10-CM | POA: Diagnosis not present

## 2020-11-09 DIAGNOSIS — M6281 Muscle weakness (generalized): Secondary | ICD-10-CM | POA: Diagnosis not present

## 2020-11-09 DIAGNOSIS — Z7409 Other reduced mobility: Secondary | ICD-10-CM | POA: Diagnosis not present

## 2020-11-09 DIAGNOSIS — M7061 Trochanteric bursitis, right hip: Secondary | ICD-10-CM | POA: Diagnosis not present

## 2020-11-09 DIAGNOSIS — R29898 Other symptoms and signs involving the musculoskeletal system: Secondary | ICD-10-CM | POA: Diagnosis not present

## 2020-11-15 DIAGNOSIS — M791 Myalgia, unspecified site: Secondary | ICD-10-CM | POA: Diagnosis not present

## 2020-11-15 DIAGNOSIS — R519 Headache, unspecified: Secondary | ICD-10-CM | POA: Diagnosis not present

## 2020-11-15 DIAGNOSIS — G518 Other disorders of facial nerve: Secondary | ICD-10-CM | POA: Diagnosis not present

## 2020-11-15 DIAGNOSIS — M542 Cervicalgia: Secondary | ICD-10-CM | POA: Diagnosis not present

## 2020-11-16 DIAGNOSIS — R29898 Other symptoms and signs involving the musculoskeletal system: Secondary | ICD-10-CM | POA: Diagnosis not present

## 2020-11-16 DIAGNOSIS — M7061 Trochanteric bursitis, right hip: Secondary | ICD-10-CM | POA: Diagnosis not present

## 2020-11-16 DIAGNOSIS — Z7409 Other reduced mobility: Secondary | ICD-10-CM | POA: Diagnosis not present

## 2020-11-16 DIAGNOSIS — M6281 Muscle weakness (generalized): Secondary | ICD-10-CM | POA: Diagnosis not present

## 2020-11-24 DIAGNOSIS — M25551 Pain in right hip: Secondary | ICD-10-CM | POA: Diagnosis not present

## 2020-11-24 DIAGNOSIS — M7062 Trochanteric bursitis, left hip: Secondary | ICD-10-CM | POA: Diagnosis not present

## 2020-11-25 DIAGNOSIS — Z23 Encounter for immunization: Secondary | ICD-10-CM | POA: Diagnosis not present

## 2020-11-29 DIAGNOSIS — G518 Other disorders of facial nerve: Secondary | ICD-10-CM | POA: Diagnosis not present

## 2020-11-29 DIAGNOSIS — Z79899 Other long term (current) drug therapy: Secondary | ICD-10-CM | POA: Diagnosis not present

## 2020-11-29 DIAGNOSIS — R519 Headache, unspecified: Secondary | ICD-10-CM | POA: Diagnosis not present

## 2020-11-29 DIAGNOSIS — M542 Cervicalgia: Secondary | ICD-10-CM | POA: Diagnosis not present

## 2020-11-29 DIAGNOSIS — M791 Myalgia, unspecified site: Secondary | ICD-10-CM | POA: Diagnosis not present

## 2020-12-13 DIAGNOSIS — M791 Myalgia, unspecified site: Secondary | ICD-10-CM | POA: Diagnosis not present

## 2020-12-13 DIAGNOSIS — M542 Cervicalgia: Secondary | ICD-10-CM | POA: Diagnosis not present

## 2020-12-13 DIAGNOSIS — G518 Other disorders of facial nerve: Secondary | ICD-10-CM | POA: Diagnosis not present

## 2020-12-13 DIAGNOSIS — R519 Headache, unspecified: Secondary | ICD-10-CM | POA: Diagnosis not present

## 2020-12-14 DIAGNOSIS — R1312 Dysphagia, oropharyngeal phase: Secondary | ICD-10-CM | POA: Diagnosis not present

## 2020-12-14 DIAGNOSIS — M0589 Other rheumatoid arthritis with rheumatoid factor of multiple sites: Secondary | ICD-10-CM | POA: Diagnosis not present

## 2020-12-14 DIAGNOSIS — K219 Gastro-esophageal reflux disease without esophagitis: Secondary | ICD-10-CM | POA: Diagnosis not present

## 2020-12-26 DIAGNOSIS — Z6836 Body mass index (BMI) 36.0-36.9, adult: Secondary | ICD-10-CM | POA: Diagnosis not present

## 2020-12-26 DIAGNOSIS — M4316 Spondylolisthesis, lumbar region: Secondary | ICD-10-CM | POA: Diagnosis not present

## 2020-12-26 DIAGNOSIS — M48062 Spinal stenosis, lumbar region with neurogenic claudication: Secondary | ICD-10-CM | POA: Diagnosis not present

## 2020-12-27 DIAGNOSIS — R519 Headache, unspecified: Secondary | ICD-10-CM | POA: Diagnosis not present

## 2020-12-27 DIAGNOSIS — M791 Myalgia, unspecified site: Secondary | ICD-10-CM | POA: Diagnosis not present

## 2020-12-27 DIAGNOSIS — G518 Other disorders of facial nerve: Secondary | ICD-10-CM | POA: Diagnosis not present

## 2020-12-27 DIAGNOSIS — M542 Cervicalgia: Secondary | ICD-10-CM | POA: Diagnosis not present

## 2021-01-03 ENCOUNTER — Ambulatory Visit (INDEPENDENT_AMBULATORY_CARE_PROVIDER_SITE_OTHER): Payer: Medicare Other | Admitting: Internal Medicine

## 2021-01-03 ENCOUNTER — Encounter: Payer: Self-pay | Admitting: Internal Medicine

## 2021-01-03 VITALS — BP 150/80 | HR 68 | Ht 61.25 in | Wt 194.1 lb

## 2021-01-03 DIAGNOSIS — K5909 Other constipation: Secondary | ICD-10-CM

## 2021-01-03 DIAGNOSIS — K222 Esophageal obstruction: Secondary | ICD-10-CM

## 2021-01-03 DIAGNOSIS — N3941 Urge incontinence: Secondary | ICD-10-CM

## 2021-01-03 DIAGNOSIS — R1319 Other dysphagia: Secondary | ICD-10-CM | POA: Diagnosis not present

## 2021-01-03 NOTE — Patient Instructions (Addendum)
You have been scheduled for an endoscopy. Please follow written instructions given to you at your visit today. If you use inhalers (even only as needed), please bring them with you on the day of your procedure.  Due to recent changes in healthcare laws, you may see the results of your imaging and laboratory studies on MyChart before your provider has had a chance to review them.  We understand that in some cases there may be results that are confusing or concerning to you. Not all laboratory results come back in the same time frame and the provider may be waiting for multiple results in order to interpret others.  Please give [place] 48 hours in order for your provider to thoroughly review all the results before contacting the office for clarification of your results.   Stop your probiotic per Dr Leone Payor.  Take a tablespoon of over the counter Benefiber daily. Also take either 2 Kiwi fruit or 4 Prunes a day.  Dr Leone Payor recommends that you complete a bowel purge (to clean out your bowels). Please do the following: Purchase a bottle of Miralax over the counter as well as a box of 5 mg dulcolax tablets. Take 4 dulcolax tablets. Wait 1 hour. You will then drink 6-8 capfuls of Miralax mixed in an adequate amount of water/juice/gatorade (you may choose which of these liquids to drink) over the next 2-3 hours. You should expect results within 1 to 6 hours after completing the bowel purge.   We are requesting your records from Dr Loreta Ave.  We are placing a referral for pelvic floor physical therapy. They will contact you to set this up.  I appreciate the opportunity to care for you. Stan Head, MD, [facility]

## 2021-01-03 NOTE — Progress Notes (Addendum)
Joy [name] 80 y.o. 05-15-1941 [id number]  Assessment & Plan:   Encounter Diagnoses  Name Primary?  . Esophageal stricture Yes  . Esophageal dysphagia   . Chronic constipation   . Urge urinary incontinence     Schedule upper GI endoscopy with dilation of the proximal esophageal stricture.The risks and benefits as well as alternatives of endoscopic procedure(s) have been discussed and reviewed. All questions answered. The patient agrees to proceed.    Stop probiotc  Miralax purge then start Benefiber daily and prunes or kiwi fruit.  This is to treat constipation.  Refer to pelvic floor physical therapy.  Treat constipation and urinary incontinence issues.  She did have spinal surgery last year that could have some relationship to them as well though I do not think so.   ROI for gastroenterology records from visits to Dr. Collene Mares.  Do not see recent TSH may need this checked.  I appreciate the opportunity to care for this patient. CC: Marda Stalker, PA-C Dr. Charyl Bigger Dr. [name]  Records review:  Colonoscopy 2006 - diverticulosis  2016 and  03/2020 OV Dr. Collene Mares - Rec Hi fiber, probiotics,  Subjective:   Chief Complaint: Dysphagia and esophageal stricture  HPI 80 year old African-American woman with rheumatoid arthritis, GERD, IBS, here with dysphagia and a barium swallow as below, ordered by Dr. Benjamine Mola ENT she had seen Dr. Benjamine Mola because of some headache issues ear and throat symptoms and describe the dysphagia severe to the barium swallow.  She is having some antral dysphagia 3 or 4 times a week.  She has never had an upper endoscopy though Dr. Collene Mares has performed colonoscopies in the past and she has been struggling with constipation of late.  She saw Dr. Collene Mares sometime in the last several months and though she had been using MiraLAX somewhat regularly with questionable benefit, she has now stopped that and has been using a probiotic that was recommended by Dr.  Collene Mares I think it was a line.  That has not really helped either and she can go days without defecation with lack of urge plus a lot of straining as well.  No bleeding etc.  She cannot recall when she has had a colonoscopy but she was apparently up-to-date with that.  She does take pantoprazole 40 mg daily and famotidine 20 mg daily in the evenings and says for the most part unless she eats something acidic she does not have heartburn.  She has rheumatoid arthritis but I do not think she has any cervical issues.  She is followed by Dr. [name].  Additional issues include some urge urinary incontinence when she feels like "my bowels are messed up".  She has had a hysterectomy she has no known history of bladder prolapse.  She had 1 vaginal delivery years ago.   Barium swallow 09/21/2020 images viewed in person IMPRESSION: 1. Cervical esophageal stricture, through which a 13 mm barium tablet would not pass. 2. Esophageal dysmotility. 3. Large hiatal hernia.  Allergies  Allergen Reactions  . Codeine Other (See Comments)    Lump in throat  . Tape Hives and Rash    Paper tape only  . Latex Rash  . Nickel Rash    Bumps Also other metals   Current Meds  Medication Sig  . albuterol (VENTOLIN HFA) 108 (90 Base) MCG/ACT inhaler Inhale 2 puffs into the lungs every 6 (six) hours as needed for wheezing or shortness of breath.  Marland Kitchen atorvastatin (LIPITOR) 40 MG tablet Take  40 mg by mouth daily.  . Biotin 1000 MCG tablet Take 1 tablet (1 mg total) by mouth daily.  . Cholecalciferol (VITAMIN D3) 25 MCG (1000 UT) CAPS Take 1 capsule (1,000 Units total) by mouth daily.  . famotidine (PEPCID) 20 MG tablet Take 20 mg by mouth daily.  . folic acid (FOLVITE) 1 MG tablet Take 1 tablet (1 mg total) by mouth daily.  Marland Kitchen gabapentin (NEURONTIN) 300 MG capsule Take 300 mg by mouth at bedtime.   . ID NOW COVID-19 KIT See admin instructions. for testing  . inFLIXimab (REMICADE IV) Inject 100 mg into the vein every 6  (six) weeks.  . inFLIXimab in sodium chloride 0.9 % Inject into the vein. Pt receives this infusion every 6 weeks  . irbesartan (AVAPRO) 300 MG tablet Take 1 tablet (300 mg total) by mouth daily.  . Magnesium 250 MG TABS Take 1 tablet (250 mg total) by mouth daily.  . Methotrexate Sodium (METHOTREXATE, PF,) 50 MG/2ML injection INJECT 1 ML ONCE WEEKLY. (25 mg)  DISCARD VIAL AFTER USE  . naproxen sodium (ALEVE) 220 MG tablet Take 220 mg by mouth 2 (two) times daily as needed (pain).  . pantoprazole (PROTONIX) 40 MG tablet Take 1 tablet (40 mg total) by mouth daily.  Vladimir Faster Glycol-Propyl Glycol (SYSTANE OP) Systane (PF)  prn  . Propylene Glycol (SYSTANE BALANCE) 0.6 % SOLN Place 1 drop into both eyes 2 (two) times daily as needed (dry eyes).  . sertraline (ZOLOFT) 25 MG tablet Take 50 mg by mouth daily.   Marland Kitchen tiotropium (SPIRIVA HANDIHALER) 18 MCG inhalation capsule Place 1 capsule into inhaler and inhale daily.  Marland Kitchen tiZANidine (ZANAFLEX) 4 MG tablet Take 1 tablet (4 mg total) by mouth every 6 (six) hours as needed for muscle spasms.  Marland Kitchen VITAMIN A PO Take 2,400 mcg by mouth daily.   . vitamin B-12 (CYANOCOBALAMIN) 1000 MCG tablet Take 1 tablet (1,000 mcg total) by mouth daily.   Past Medical History:  Diagnosis Date  . Ankylosing spondylitis ([place])   . Anxiety   . Arthritis    RHEUMATOID  . Back pain   . Bronchitis   . Constipation   . COPD (chronic obstructive pulmonary disease) ([place])    CXR 01/11/18 showed mild COPD and chronic bronchitis  . CTS (carpal tunnel syndrome)   . Dry eye   . Dry mouth   . Dysrhythmia    "irregularity" unknown at this time - being evaluated by cardiology  . Essential hypertension 11/24/2015  . GERD (gastroesophageal reflux disease)   . HLA B27 (HLA B27 positive)   . Hypercholesteremia   . Hyperlipidemia 11/24/2015  . Hypertension   . IBS (irritable bowel syndrome)   . Joint pain   . Neuropathy   . OAB (overactive bladder)   . Obesity   . Osteoarthritis    . Osteoporosis   . Pre-diabetes   . Rheumatoid arthritis ([place]) 11/24/2015  . Sciatica   . Seasonal allergies   . Spinal stenosis    Past Surgical History:  Procedure Laterality Date  . ABDOMINAL HYSTERECTOMY  1995  . BACK SURGERY    . BREAST SURGERY     REDUCTION  . CATARACT EXTRACTION W/PHACO  08/01/2012   Procedure: CATARACT EXTRACTION PHACO AND INTRAOCULAR LENS PLACEMENT (IOC);  Surgeon: Marylynn Pearson, MD;  Location: [place];  Service: Ophthalmology;  Laterality: Right;  . EYE SURGERY Bilateral    cataract  . HERNIA REPAIR     RIGHT ING.  Marland Kitchen  JOINT REPLACEMENT  2014   rt total knee  . TONSILLECTOMY    . 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   Retired from [employer], Electronics engineer.   Moved back to [place] in 1995.   No alcohol 2 caffeinated beverages daily no drug use former smoker no current tobacco   She lives with her daughter and son-in-law.  She has adult grandchildren.   family history includes Arthritis in her sister, sister, and sister; Cerebral aneurysm in her mother; Diabetes in her sister and sister; Heart attack in her daughter and father; Heart disease in her father and mother; Hypertension in her father, mother, sister, sister, sister, sister, and sister; Obesity in her mother; Rheum arthritis in her maternal grandmother; Stroke in her sister.   Review of Systems .  Also positive for back pain depressed mood fatigue itchy skin myalgia/muscle cramps  Objective:   Physical Exam BP (!) 150/80 (BP Location: Left Arm, Patient Position: Sitting, Cuff Size: Normal)   Pulse  68   Ht 5' 1.25" (1.556 m) Comment: height measured without shoes  Wt 194 lb 2 oz (88.1 kg)   BMI 36.38 kg/m  NAD obese bw dentures Lungs scattered wheeze Ht nl s1s2 no rmg abd obese soft mildly tender LLQ/RLA no masses, hernia  Patti Martinique, CMA present.  Rectal exam Anoderm inspection revealed NL Anal wink was ++ Digital exam revealed normal resting tone and voluntary squeeze. No mass or rectocele present. Simulated defecation with valsalva revealed appropriate abdominal contraction and increased descent.   Alert and oriented x 3   Data reviewed as per HPI which includes imaging and visualization of barium swallow imaging, primary care summary in care everywhere from March 2022 labs that are including that normal CBC hemoglobin A1c is 6.2

## 2021-01-17 ENCOUNTER — Encounter: Payer: Self-pay | Admitting: Certified Registered Nurse Anesthetist

## 2021-01-17 DIAGNOSIS — R519 Headache, unspecified: Secondary | ICD-10-CM | POA: Diagnosis not present

## 2021-01-17 DIAGNOSIS — M542 Cervicalgia: Secondary | ICD-10-CM | POA: Diagnosis not present

## 2021-01-17 DIAGNOSIS — G518 Other disorders of facial nerve: Secondary | ICD-10-CM | POA: Diagnosis not present

## 2021-01-17 DIAGNOSIS — M791 Myalgia, unspecified site: Secondary | ICD-10-CM | POA: Diagnosis not present

## 2021-01-18 ENCOUNTER — Other Ambulatory Visit: Payer: Self-pay

## 2021-01-18 ENCOUNTER — Ambulatory Visit (AMBULATORY_SURGERY_CENTER): Payer: Medicare Other | Admitting: Internal Medicine

## 2021-01-18 ENCOUNTER — Encounter: Payer: Self-pay | Admitting: Internal Medicine

## 2021-01-18 VITALS — BP 120/88 | HR 56 | Temp 97.5°F | Resp 11 | Ht 61.0 in | Wt 194.0 lb

## 2021-01-18 DIAGNOSIS — K2289 Other specified disease of esophagus: Secondary | ICD-10-CM | POA: Diagnosis not present

## 2021-01-18 DIAGNOSIS — K449 Diaphragmatic hernia without obstruction or gangrene: Secondary | ICD-10-CM | POA: Diagnosis not present

## 2021-01-18 DIAGNOSIS — K222 Esophageal obstruction: Secondary | ICD-10-CM

## 2021-01-18 DIAGNOSIS — R1310 Dysphagia, unspecified: Secondary | ICD-10-CM

## 2021-01-18 DIAGNOSIS — M069 Rheumatoid arthritis, unspecified: Secondary | ICD-10-CM | POA: Diagnosis not present

## 2021-01-18 DIAGNOSIS — K219 Gastro-esophageal reflux disease without esophagitis: Secondary | ICD-10-CM

## 2021-01-18 DIAGNOSIS — J449 Chronic obstructive pulmonary disease, unspecified: Secondary | ICD-10-CM | POA: Diagnosis not present

## 2021-01-18 DIAGNOSIS — K209 Esophagitis, unspecified without bleeding: Secondary | ICD-10-CM

## 2021-01-18 MED ORDER — SODIUM CHLORIDE 0.9 % IV SOLN: 500.0000 mL | Freq: Once | INTRAVENOUS | Status: DC

## 2021-01-18 NOTE — Progress Notes (Signed)
Called to room to assist during endoscopic procedure.  Patient ID and intended procedure confirmed with present staff. Received instructions for my participation in the procedure from the performing physician.  

## 2021-01-18 NOTE — Progress Notes (Signed)
Pt's states no medical or surgical changes since previsit or office visit. 

## 2021-01-18 NOTE — Progress Notes (Signed)
Report given to PACU, vss 

## 2021-01-18 NOTE — Progress Notes (Signed)
1359 Robinul 0.1 mg IV given due large amount of secretions upon assessment.  MD made aware, vss 

## 2021-01-18 NOTE — Op Note (Signed)
[facility] Patient Name: Joy [name] Procedure Date: 01/18/2021 2:00 PM MRN: [id number] Endoscopist: Iva Boop , MD Age: 80 Referring MD:  Date of Birth: 01/11/1941 Gender: Female Account #: [id number] Procedure:                Upper GI endoscopy Indications:              Dysphagia, Suspected stenosis of the esophagus,                            Abnormal UGI series Medicines:                Propofol per Anesthesia, Monitored Anesthesia Care Procedure:                Pre-Anesthesia Assessment:                           - Prior to the procedure, a History and Physical                            was performed, and patient medications and                            allergies were reviewed. The patient's tolerance of                            previous anesthesia was also reviewed. The risks                            and benefits of the procedure and the sedation                            options and risks were discussed with the patient.                            All questions were answered, and informed consent                            was obtained. Prior Anticoagulants: The patient has                            taken no previous anticoagulant or antiplatelet                            agents. ASA Grade Assessment: III - A patient with                            severe systemic disease. After reviewing the risks                            and benefits, the patient was deemed in                            satisfactory condition to undergo the procedure.  After obtaining informed consent, the endoscope was                            passed under direct vision. Throughout the                            procedure, the patient's blood pressure, pulse, and                            oxygen saturations were monitored continuously. The                            Endoscope was introduced through the mouth, and                            advanced to  the second part of duodenum. The upper                            GI endoscopy was accomplished without difficulty.                            The patient tolerated the procedure well. Scope In: Scope Out: Findings:                 A web was found in the lower third of the                            esophagus. Biopsies were taken with a cold forceps                            for histology. Verification of patient                            identification for the specimen was done. Estimated                            blood loss was minimal.                           Mucosal changes including ringed esophagus,                            small-caliber esophagus and punctate white spots                            were found in the entire esophagus. Biopsies were                            obtained from the proximal and distal esophagus                            with cold forceps for histology of suspected  eosinophilic esophagitis. Verification of patient                            identification for the specimen was done. Estimated                            blood loss was minimal. The scope was withdrawn.                            Dilation was performed with a Maloney dilator with                            no resistance at 48 Fr. This was passed to about 30                            cm to dilate cervical esophagus where stenosis                            noted on ba swallowThe dilation site was examined                            and showed no change. Estimated blood loss: none.                           A 4 cm hiatal hernia was present.                           The gastroesophageal flap valve was visualized                            endoscopically and classified as Hill Grade IV (no                            fold, wide open lumen, hiatal hernia present).                           The exam was otherwise without abnormality.                           The  cardia and gastric fundus were normal on                            retroflexion. Complications:            No immediate complications. Estimated Blood Loss:     Estimated blood loss was minimal. 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 Biopsied.                           - Esophageal mucosal changes suggestive of  eosinophilic esophagitis. Biopsied. Dilated. 48 Fr                            passed to 30 cm to dilate proximal esophagus                           - 4 cm hiatal hernia.                           - Gastroesophageal flap valve classified as Hill                            Grade IV (no fold, wide open lumen, hiatal hernia                            present).                           - The examination was otherwise normal. Recommendation:           - Patient has a contact number available for                            emergencies. The signs and symptoms of potential                            delayed complications were discussed with the                            patient. Return to normal activities tomorrow.                            Written discharge instructions were provided to the                            patient.                           - Clear liquids x 1 hour then soft foods rest of                            day. Start prior diet tomorrow.                           - Continue present medications.                           - Await pathology results. Iva Boop, MD 01/18/2021 2:20:22 PM This report has been signed electronically.

## 2021-01-18 NOTE — Patient Instructions (Addendum)
The esophagus looks inflamed and had some narrow areas that I opened up with biopsies and I also dilated the upper esophagus where the tablet hung up.  I need to see the pathology results and will make recommendations. I wonder if you have something related to autoimmune diseases going on.  Please take pantoprazole every day - even if you do not have heartburn or indigestion.   I appreciate the opportunity to care for you. Iva Boop, MD, [facility] Handout given:  Hiatal hernia, post dilation diet Start post dilation diet today Continue current medications Await pathology results  YOU HAD AN ENDOSCOPIC PROCEDURE TODAY AT THE [facility]:   Refer to the procedure report that was given to you for any specific questions about what was found during the examination.  If the procedure report does not answer your questions, please call your gastroenterologist to clarify.  If you requested that your care partner not be given the details of your procedure findings, then the procedure report has been included in a sealed envelope for you to review at your convenience later.  YOU SHOULD EXPECT: Some feelings of bloating in the abdomen. Passage of more gas than usual.  Walking can help get rid of the air that was put into your GI tract during the procedure and reduce the bloating. If you had a lower endoscopy (such as a colonoscopy or flexible sigmoidoscopy) you may notice spotting of blood in your stool or on the toilet paper. If you underwent a bowel prep for your procedure, you may not have a normal bowel movement for a few days.  Please Note:  You might notice some irritation and congestion in your nose or some drainage.  This is from the oxygen used during your procedure.  There is no need for concern and it should clear up in a day or so.  SYMPTOMS TO REPORT IMMEDIATELY:   Following upper endoscopy (EGD)  Vomiting of blood or coffee ground material  New chest pain or pain under the  shoulder blades  Painful or persistently difficult swallowing  New shortness of breath  Fever of 100F or higher  Black, tarry-looking stools  For urgent or emergent issues, a gastroenterologist can be reached at any hour by calling [phone]. Do not use MyChart messaging for urgent concerns.   DIET:  We do recommend a small meal at first, but then you may proceed to your regular diet.  Drink plenty of fluids but you should avoid alcoholic beverages for 24 hours.  ACTIVITY:  You should plan to take it easy for the rest of today and you should NOT DRIVE or use heavy machinery until tomorrow (because of the sedation medicines used during the test).    FOLLOW UP: Our staff will call the number listed on your records 48-72 hours following your procedure to check on you and address any questions or concerns that you may have regarding the information given to you following your procedure. If we do not reach you, we will leave a message.  We will attempt to reach you two times.  During this call, we will ask if you have developed any symptoms of COVID 19. If you develop any symptoms (ie: fever, flu-like symptoms, shortness of breath, cough etc.) before then, please call [phone].  If you test positive for Covid 19 in the 2 weeks post procedure, please call and report this information to [place].    If any biopsies were taken you will be contacted by  phone or by letter within the next 1-3 weeks.  Please call us at [phone] if you have not heard about the biopsies in 3 weeks.   SIGNATURES/CONFIDENTIALITY: You and/or your care partner have signed paperwork which will be entered into your electronic medical record.  These signatures attest to the fact that that the information above on your After Visit Summary has been reviewed and is understood.  Full responsibility of the confidentiality of this discharge information lies with you and/or your care-partner.

## 2021-01-18 NOTE — Progress Notes (Signed)
[place]. vital signs., °

## 2021-01-20 ENCOUNTER — Telehealth: Payer: Self-pay

## 2021-01-20 NOTE — Telephone Encounter (Signed)
  Follow up Call-  Call back number 01/18/2021  Post procedure Call Back phone  # [phone]  Permission to leave phone message Yes  Some recent data might be hidden     Patient questions:  Do you have a fever, pain , or abdominal swelling? No. Pain Score  0 *  Have you tolerated food without any problems? Yes.    Have you been able to return to your normal activities? Yes.    Do you have any questions about your discharge instructions: Diet   No. Medications  No. Follow up visit  No.  Do you have questions or concerns about your Care? No.  Actions: * If pain score is 4 or above: No action needed, pain <4.  1. Have you developed a fever since your procedure? No   2.   Have you had an respiratory symptoms (SOB or cough) since your procedure? No   3.   Have you tested positive for COVID 19 since your procedure no   4.   Have you had any family members/close contacts diagnosed with the COVID 19 since your procedure?  No    If yes to any of these questions please route to Laverna Peace, RN and Karlton Lemon, RN

## 2021-01-25 DIAGNOSIS — M0589 Other rheumatoid arthritis with rheumatoid factor of multiple sites: Secondary | ICD-10-CM | POA: Diagnosis not present

## 2021-01-30 DIAGNOSIS — Z1231 Encounter for screening mammogram for malignant neoplasm of breast: Secondary | ICD-10-CM | POA: Diagnosis not present

## 2021-01-31 DIAGNOSIS — M542 Cervicalgia: Secondary | ICD-10-CM | POA: Diagnosis not present

## 2021-01-31 DIAGNOSIS — G518 Other disorders of facial nerve: Secondary | ICD-10-CM | POA: Diagnosis not present

## 2021-01-31 DIAGNOSIS — M791 Myalgia, unspecified site: Secondary | ICD-10-CM | POA: Diagnosis not present

## 2021-01-31 DIAGNOSIS — I10 Essential (primary) hypertension: Secondary | ICD-10-CM | POA: Diagnosis not present

## 2021-01-31 DIAGNOSIS — R109 Unspecified abdominal pain: Secondary | ICD-10-CM | POA: Diagnosis not present

## 2021-01-31 DIAGNOSIS — R519 Headache, unspecified: Secondary | ICD-10-CM | POA: Diagnosis not present

## 2021-02-15 ENCOUNTER — Encounter (HOSPITAL_BASED_OUTPATIENT_CLINIC_OR_DEPARTMENT_OTHER): Payer: Self-pay | Admitting: Emergency Medicine

## 2021-02-15 ENCOUNTER — Other Ambulatory Visit: Payer: Self-pay

## 2021-02-15 ENCOUNTER — Emergency Department (HOSPITAL_BASED_OUTPATIENT_CLINIC_OR_DEPARTMENT_OTHER)
Admission: EM | Admit: 2021-02-15 | Discharge: 2021-02-16 | Disposition: A | Discharge status: Home / Self Care | Payer: Medicare Other | Attending: Emergency Medicine | Admitting: Emergency Medicine

## 2021-02-15 DIAGNOSIS — Z96653 Presence of artificial knee joint, bilateral: Secondary | ICD-10-CM | POA: Diagnosis not present

## 2021-02-15 DIAGNOSIS — I10 Essential (primary) hypertension: Secondary | ICD-10-CM | POA: Insufficient documentation

## 2021-02-15 DIAGNOSIS — R231 Pallor: Secondary | ICD-10-CM | POA: Diagnosis not present

## 2021-02-15 DIAGNOSIS — J449 Chronic obstructive pulmonary disease, unspecified: Secondary | ICD-10-CM | POA: Insufficient documentation

## 2021-02-15 DIAGNOSIS — Z87891 Personal history of nicotine dependence: Secondary | ICD-10-CM | POA: Diagnosis not present

## 2021-02-15 DIAGNOSIS — N390 Urinary tract infection, site not specified: Secondary | ICD-10-CM | POA: Insufficient documentation

## 2021-02-15 DIAGNOSIS — Z91040 Latex allergy status: Secondary | ICD-10-CM | POA: Diagnosis not present

## 2021-02-15 DIAGNOSIS — R42 Dizziness and giddiness: Secondary | ICD-10-CM | POA: Diagnosis not present

## 2021-02-15 DIAGNOSIS — B9689 Other specified bacterial agents as the cause of diseases classified elsewhere: Secondary | ICD-10-CM | POA: Insufficient documentation

## 2021-02-15 DIAGNOSIS — Z79899 Other long term (current) drug therapy: Secondary | ICD-10-CM | POA: Diagnosis not present

## 2021-02-15 DIAGNOSIS — I959 Hypotension, unspecified: Secondary | ICD-10-CM | POA: Diagnosis not present

## 2021-02-15 DIAGNOSIS — R55 Syncope and collapse: Secondary | ICD-10-CM | POA: Diagnosis not present

## 2021-02-15 LAB — BASIC METABOLIC PANEL
Anion gap: 4 — ABNORMAL LOW (ref 5–15)
BUN: 23 mg/dL (ref 8–23)
CO2: 27 mmol/L (ref 22–32)
Calcium: 9.3 mg/dL (ref 8.9–10.3)
Chloride: 107 mmol/L (ref 98–111)
Creatinine, Ser: 0.88 mg/dL (ref 0.44–1.00)
GFR, Estimated: >60 mL/min (ref >60)
Glucose, Bld: 166 mg/dL — ABNORMAL HIGH (ref 70–99)
Potassium: 3.6 mmol/L (ref 3.5–5.1)
Sodium: 138 mmol/L (ref 135–145)

## 2021-02-15 LAB — URINALYSIS, ROUTINE W REFLEX MICROSCOPIC
Bilirubin Urine: NEGATIVE
Glucose, UA: NEGATIVE mg/dL
Hgb urine dipstick: NEGATIVE
Ketones, ur: NEGATIVE mg/dL
Nitrite: POSITIVE — AB
Protein, ur: NEGATIVE mg/dL
Specific Gravity, Urine: 1.02 (ref 1.005–1.030)
pH: 6 (ref 5.0–8.0)

## 2021-02-15 LAB — CBC
HCT: 36.1 % (ref 36.0–46.0)
Hemoglobin: 11.9 g/dL — ABNORMAL LOW (ref 12.0–15.0)
MCH: 31.4 pg (ref 26.0–34.0)
MCHC: 33 g/dL (ref 30.0–36.0)
MCV: 95.3 fL (ref 80.0–100.0)
Platelets: 233 10*3/uL (ref 150–400)
RBC: 3.79 MIL/uL — ABNORMAL LOW (ref 3.87–5.11)
RDW: 15.2 % (ref 11.5–15.5)
WBC: 4.3 10*3/uL (ref 4.0–10.5)
nRBC: 0 % (ref 0.0–0.2)

## 2021-02-15 LAB — URINALYSIS, MICROSCOPIC (REFLEX)

## 2021-02-15 MED ORDER — FOSFOMYCIN TROMETHAMINE 3 G PO PACK
3.0000 g | PACK | Freq: Once | ORAL | Status: AC
  Administered 2021-02-15: 3 g via ORAL
  Filled 2021-02-15: qty 3

## 2021-02-15 MED ORDER — SODIUM CHLORIDE 0.9 % IV BOLUS
500.0000 mL | Freq: Once | INTRAVENOUS | Status: AC
  Administered 2021-02-15: 500 mL via INTRAVENOUS

## 2021-02-15 NOTE — ED Provider Notes (Signed)
[facility] Provider Note: Georgena Spurling, MD, FACEP  CSN: [id number] MRN: [id number] ARRIVAL: 02/15/21 at 2207 ROOM: [place]  Near Syncope   HISTORY OF PRESENT ILLNESS  02/15/21 11:17 PM Joy [name] is a 80 y.o. female who was outside from 6 to 8 PM but was not in direct sun.  Nevertheless, she does not think she has been drinking as much as she should and her daughter concurs.  After making dinner this evening she stood up and felt lightheaded.  Her daughter and son-in-law had to help her to the ground to keep her from falling.  She felt very weak and unable to stand back up on her own.  She denies any associated chest pain, shortness of breath or palpitations.  EMS was called and they found her initial BP to be 82.  She was given a 250 mL normal saline bolus prior to arrival.  Repeat blood pressure was 136/64.  She feels better now but not 100% back to normal.  She denies dysuria.  Her daughter states that she is at her baseline mental status and had no focal neurologic deficits associated with her episode.  She did not fully lose consciousness.  She has been having pain on her left side, notably her left leg and greater trochanter.  She has a history of back pain and back surgery and has an appointment with orthopedics pending.    Past Medical History:  Diagnosis Date   Ankylosing spondylitis ([place])    Anxiety    Arthritis    RHEUMATOID   Back pain    Bronchitis    Constipation    COPD (chronic obstructive pulmonary disease) ([place])    CXR 01/11/18 showed mild COPD and chronic bronchitis   CTS (carpal tunnel syndrome)    Dry eye    Dry mouth    Dysrhythmia    "irregularity" unknown at this time - being evaluated by cardiology   Essential hypertension 11/24/2015   GERD (gastroesophageal reflux disease)    HLA B27 (HLA B27 positive)    Hypercholesteremia    Hyperlipidemia 11/24/2015   Hypertension    IBS (irritable bowel syndrome)    Joint  pain    Neuropathy    OAB (overactive bladder)    Obesity    Osteoarthritis    Osteoporosis    Pre-diabetes    Rheumatoid arthritis ([place]) 11/24/2015   Sciatica    Seasonal allergies    Spinal stenosis     Past Surgical History:  Procedure Laterality Date   ABDOMINAL HYSTERECTOMY  1995   BACK SURGERY     BREAST SURGERY     REDUCTION   CATARACT EXTRACTION W/PHACO  08/01/2012   Procedure: CATARACT EXTRACTION PHACO AND INTRAOCULAR LENS PLACEMENT ([place]);  Surgeon: Marylynn Pearson, MD;  Location: [facility];  Service: Ophthalmology;  Laterality: Right;   EYE SURGERY Bilateral    cataract   HERNIA REPAIR     RIGHT ING.   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   Procedure: TOTAL KNEE ARTHROPLASTY;  Surgeon: Gaynelle Arabian, MD;  Location: [facility];  Service: Orthopedics;  Laterality: Left;    Family History  Problem Relation Age of Onset   Heart disease Mother    Cerebral aneurysm Mother    Hypertension Mother    Obesity Mother    Heart attack Father    Heart disease Father  Hypertension Father    Hypertension Sister    Arthritis Sister    Stroke Sister    Hypertension Sister    Arthritis Sister    Hypertension Sister    Arthritis Sister    Hypertension Sister    Diabetes Sister    Hypertension Sister    Diabetes Sister    Rheum arthritis Maternal Grandmother    Heart attack Daughter     Social History   Tobacco Use   Smoking status: Former    Packs/day: 1.00    Years: 35.00    Pack years: 35.00    Types: Cigarettes    Quit date: 05/29/1995    Years since quitting: 25.7   Smokeless tobacco: Never  Vaping Use   Vaping Use: Never used  Substance Use Topics   Alcohol use: Yes    Comment: 1 glass of wine daily   Drug use: No    Prior to Admission medications   Medication Sig Start Date End Date Taking? Authorizing Provider  albuterol (VENTOLIN HFA) 108 (90 Base) MCG/ACT inhaler Inhale 2 puffs into the lungs  every 6 (six) hours as needed for wheezing or shortness of breath. 02/12/19   Lauraine Rinne, NP  atorvastatin (LIPITOR) 40 MG tablet Take 40 mg by mouth daily.    [provider]  Biotin 1000 MCG tablet Take 1 tablet (1 mg total) by mouth daily. 07/09/18   Hennie Duos, MD  Cholecalciferol (VITAMIN D3) 25 MCG (1000 UT) CAPS Take 1 capsule (1,000 Units total) by mouth daily. 07/09/18   Hennie Duos, MD  famotidine (PEPCID) 20 MG tablet Take 20 mg by mouth daily. 01/02/21   [provider]  folic acid (FOLVITE) 1 MG tablet Take 1 tablet (1 mg total) by mouth daily. 07/09/18   Hennie Duos, MD  gabapentin (NEURONTIN) 300 MG capsule Take 300 mg by mouth at bedtime.  10/27/15   [provider]  ID NOW COVID-19 KIT See admin instructions. for testing 12/18/19   [provider]  inFLIXimab (REMICADE IV) Inject 100 mg into the vein every 6 (six) weeks.    [provider]  inFLIXimab in sodium chloride 0.9 % Inject into the vein. Pt receives this infusion every 6 weeks    [provider]  irbesartan (AVAPRO) 300 MG tablet Take 1 tablet (300 mg total) by mouth daily. 07/09/18   Hennie Duos, MD  Magnesium 250 MG TABS Take 1 tablet (250 mg total) by mouth daily. 07/09/18   Hennie Duos, MD  Methotrexate Sodium (METHOTREXATE, PF,) 50 MG/2ML injection INJECT 1 ML ONCE WEEKLY. (25 mg)  DISCARD VIAL AFTER USE 11/13/18   [provider]  naproxen sodium (ALEVE) 220 MG tablet Take 220 mg by mouth 2 (two) times daily as needed (pain).    [provider]  pantoprazole (PROTONIX) 40 MG tablet Take 1 tablet (40 mg total) by mouth daily. 07/09/18   Hennie Duos, MD  Polyethyl Glycol-Propyl Glycol (SYSTANE OP) Systane (PF)  prn    [provider]  Propylene Glycol (SYSTANE BALANCE) 0.6 % SOLN Place 1 drop into both eyes 2 (two) times daily as needed (dry eyes).    [provider]  sertraline (ZOLOFT) 25 MG  tablet Take 50 mg by mouth daily.     [provider]  tiotropium (SPIRIVA HANDIHALER) 18 MCG inhalation capsule Place 1 capsule into inhaler and inhale daily.    [provider]  tiZANidine (ZANAFLEX) 4  MG tablet Take 1 tablet (4 mg total) by mouth every 6 (six) hours as needed for muscle spasms. 02/05/20   Ashok Pall, MD  VITAMIN A PO Take 2,400 mcg by mouth daily.     [provider]  vitamin B-12 (CYANOCOBALAMIN) 1000 MCG tablet Take 1 tablet (1,000 mcg total) by mouth daily. 07/09/18   Hennie Duos, MD    Allergies Codeine, Tape, Latex, and Nickel   REVIEW OF SYSTEMS  Negative except as noted here or in the History of Present Illness.   PHYSICAL EXAMINATION  Initial Vital Signs Blood pressure (!) 142/53, pulse 64, temperature (!) 97.5 F (36.4 C), temperature source Oral, resp. rate 19, height _0  (1.549 m), weight 86.6 kg, SpO2 100 %.  Examination General: Well-developed, well-nourished female in no acute distress; appearance consistent with age of record HENT: normocephalic; atraumatic Eyes: pupils equal, round and reactive to light; extraocular muscles intact Neck: supple Heart: regular rate and rhythm Lungs: clear to auscultation bilaterally Abdomen: soft; nondistended; nontender; bowel sounds present Extremities: No acute deformity; full range of motion; tenderness over left greater trochanter Neurologic: Awake, alert and oriented; motor function intact in all extremities and symmetric; no facial droop Skin: Warm and dry Psychiatric: Normal mood and affect   RESULTS  Summary of this visit's results, reviewed and interpreted by myself:   EKG Interpretation  Date/Time:  Wednesday February 15 2021 22:16:42 EDT Ventricular Rate:  65 PR Interval:  48 QRS Duration: 113 QT Interval:  465 QTC Calculation: 484 R Axis:   -7 Text Interpretation: Sinus rhythm Short PR interval Artifact No significant change was found Confirmed by Taavi Hoose,  Jenny Reichmann [phone]) on 02/15/2021 10:48:00 PM        Laboratory Studies: Results for orders placed or performed during the hospital encounter of 02/15/21 (from the past 24 hour(s))  Basic metabolic panel     Status: Abnormal   Collection Time: 02/15/21 10:31 PM  Result Value Ref Range   Sodium 138 135 - 145 mmol/L   Potassium 3.6 3.5 - 5.1 mmol/L   Chloride 107 98 - 111 mmol/L   CO2 27 22 - 32 mmol/L   Glucose, Bld 166 (H) 70 - 99 mg/dL   BUN 23 8 - 23 mg/dL   Creatinine, Ser 0.88 0.44 - 1.00 mg/dL   Calcium 9.3 8.9 - 10.3 mg/dL   GFR, Estimated >60 >60 mL/min   Anion gap 4 (L) 5 - 15  CBC     Status: Abnormal   Collection Time: 02/15/21 10:31 PM  Result Value Ref Range   WBC 4.3 4.0 - 10.5 K/uL   RBC 3.79 (L) 3.87 - 5.11 MIL/uL   Hemoglobin 11.9 (L) 12.0 - 15.0 g/dL   HCT 36.1 36.0 - 46.0 %   MCV 95.3 80.0 - 100.0 fL   MCH 31.4 26.0 - 34.0 pg   MCHC 33.0 30.0 - 36.0 g/dL   RDW 15.2 11.5 - 15.5 %   Platelets 233 150 - 400 K/uL   nRBC 0.0 0.0 - 0.2 %  Urinalysis, Routine w reflex microscopic Urine, Clean Catch     Status: Abnormal   Collection Time: 02/15/21 10:31 PM  Result Value Ref Range   Color, Urine YELLOW YELLOW   APPearance CLOUDY (A) CLEAR   Specific Gravity, Urine 1.020 1.005 - 1.030   pH 6.0 5.0 - 8.0   Glucose, UA NEGATIVE NEGATIVE mg/dL   Hgb urine dipstick NEGATIVE NEGATIVE   Bilirubin Urine NEGATIVE NEGATIVE   Ketones,  ur NEGATIVE NEGATIVE mg/dL   Protein, ur NEGATIVE NEGATIVE mg/dL   Nitrite POSITIVE (A) NEGATIVE   Leukocytes,Ua SMALL (A) NEGATIVE  Urinalysis, Microscopic (reflex)     Status: Abnormal   Collection Time: 02/15/21 10:31 PM  Result Value Ref Range   RBC / HPF 0-5 0 - 5 RBC/hpf   WBC, UA 11-20 0 - 5 WBC/hpf   Bacteria, UA MANY (A) NONE SEEN   Squamous Epithelial / LPF 0-5 0 - 5   Imaging Studies: No results found.  ED COURSE and MDM  Nursing notes, initial and subsequent vitals signs, including pulse oximetry, reviewed and interpreted by  myself.  Vitals:   02/15/21 2218 02/15/21 2300 02/15/21 2330 02/16/21 0000  BP: (!) 142/53 (!) 149/64 (!) 168/105 (!) 170/71  Pulse: 64 60 61 65  Resp: 19 (!) 22 (!) 21 18  Temp: (!) 97.5 F (36.4 C)     TempSrc: Oral     SpO2: 100% 100% 100% 99%  Weight:      Height:       Medications  sodium chloride 0.9 % bolus 500 mL (0 mLs Intravenous Stopped 02/16/21 0033)  fosfomycin (MONUROL) packet 3 g (3 g Oral Given 02/15/21 2342)   12:38 AM Patient feeling better, ambulating well after additional IV fluid bolus.  The cause of her near syncope may be due to dehydration as she admits she does not drink very much fluids.  She may have gotten overheated after being outside.  Her urinalysis is consistent with a urinary tract infection and we we will treat with fosfomycin.  This may also have contributed to her symptoms.  She appears stable for discharge at this time.   PROCEDURES  Procedures   ED DIAGNOSES     ICD-10-CM   1. Near syncope  R55     2. Lower urinary tract infectious disease  N39.0          Princessa Lesmeister, MD 02/16/21 [phone]

## 2021-02-15 NOTE — ED Triage Notes (Signed)
Pt arrives from home via [facility]. Per their report...Marland KitchenMarland KitchenPt has been outside all day in the heat, reported she has not been drinking as much today. Making dinner tonight, she stood up, felt dizzy, called EMS. Initial BP 82 palpated by fire. CBG 166. Hx of HTN. 12 lead unremarkable. C/A/O. 250ns given via a 22g in the left hand. Repeat bp 136/64, hr 60's

## 2021-02-16 NOTE — ED Notes (Signed)
Pt ambulated in department, states she feels good and is ready to go home. Dr. Read Drivers made aware.

## 2021-02-18 LAB — URINE CULTURE: Culture: >=100000 — AB

## 2021-02-19 ENCOUNTER — Telehealth: Payer: Self-pay | Admitting: Emergency Medicine

## 2021-02-19 NOTE — Progress Notes (Signed)
ED Antimicrobial Stewardship Positive Culture Follow Up   Joy [name] is an 80 y.o. female who presented to [facility] on 02/15/2021 with a chief complaint of  Chief Complaint  Patient presents with   Near Syncope    Recent Results (from the past 720 hour(s))  Urine culture     Status: Abnormal   Collection Time: 02/15/21 10:31 PM   Specimen: Urine, Random  Result Value Ref Range Status   Specimen Description   Final    URINE, RANDOM Performed at [facility], [address] [place], [place] [address]    Special Requests   Final    NONE Performed at [facility], [address] [place], [place] [address]    Culture >=100,000 COLONIES/mL KLEBSIELLA PNEUMONIAE (A)  Final   Report Status 02/18/2021 FINAL  Final   Organism ID, Bacteria KLEBSIELLA PNEUMONIAE (A)  Final      Susceptibility   Klebsiella pneumoniae - MIC*    AMPICILLIN RESISTANT Resistant     CEFAZOLIN <=4 SENSITIVE Sensitive     CEFEPIME <=0.12 SENSITIVE Sensitive     CEFTRIAXONE <=0.25 SENSITIVE Sensitive     CIPROFLOXACIN <=0.25 SENSITIVE Sensitive     GENTAMICIN <=1 SENSITIVE Sensitive     IMIPENEM <=0.25 SENSITIVE Sensitive     NITROFURANTOIN 32 SENSITIVE Sensitive     TRIMETH/SULFA <=20 SENSITIVE Sensitive     AMPICILLIN/SULBACTAM 4 SENSITIVE Sensitive     PIP/TAZO <=4 SENSITIVE Sensitive     * >=100,000 COLONIES/mL KLEBSIELLA PNEUMONIAE   [x]  Treated with fosfomycin, agent susceptibility not reported  Call patient to ensure no new, recurring, or worsening symptoms of UTI, given fosfomycin susceptibility is not reported.  ED Provider: A , PharmD, BCPS 02/19/2021 11:55 AM ED Clinical Pharmacist -  [phone]

## 2021-02-19 NOTE — Telephone Encounter (Signed)
Post ED Visit - Positive Culture Follow-up: Successful Patient Follow-Up  Culture assessed and recommendations reviewed by:  []  , Pharm.D. []  Enzo Bi, .D., BCPS AQ-ID []  Celedonio Miyamoto, Pharm.D., BCPS []  [address], Pharm.D., BCPS []  [place], Garvin Fila.D., BCPS, AAHIVP []  , Pharm.D., BCPS, AAHIVP []  Georgina Pillion, PharmD, BCPS []  , PharmD, BCPS []  [place], PharmD, BCPS [x]  [address], PharmD  Positive urine culture  []  Patient discharged without antimicrobial prescription and treatment is now indicated []  Organism is resistant to prescribed ED discharge antimicrobial []  Patient with positive blood cultures  Changes discussed with ED provider: , MD Contacted patient, date 02/19/2021, time 1600 Symptom check. Patient reports no urinary symptoms. Has follow-up appointment with primary doctor this week.    Sherlynn [place] 02/19/2021, 5:52 PM

## 2021-02-22 DIAGNOSIS — M7062 Trochanteric bursitis, left hip: Secondary | ICD-10-CM | POA: Diagnosis not present

## 2021-02-28 DIAGNOSIS — R058 Other specified cough: Secondary | ICD-10-CM | POA: Diagnosis not present

## 2021-02-28 DIAGNOSIS — R55 Syncope and collapse: Secondary | ICD-10-CM | POA: Diagnosis not present

## 2021-03-02 NOTE — Progress Notes (Signed)
Referring-John Molpus, MD Reason for referral-near syncope  HPI: 80 year old female for evaluation of near syncope at request of Shanon Rosser, MD.  Patient typically followed by Dr. Oval Linsey.  Nuclear study April 2017 showed ejection fraction 54% and no ischemia.  Monitor April 2017 showed sinus with PACs and PVCs.  Patient seen in the emergency room February 15, 2021 following an episode of near syncope that was felt likely to be orthostatic mediated.  Patient stood and developed dizziness and near syncope.  Systolic blood pressure 82 when EMS evaluated.  She was given IV fluids with improvement.  Added to my schedule today.  Patient states that on the day of her event she was sitting and eating and developed diaphoresis followed by dizziness.  Her daughter stood her to help her to the couch and her symptoms worsened.  They then lowered her to the ground.  She does not think she ever had syncope.  She was felt likely to be orthostatic mediated.  She has increased her fluid intake and her symptoms have somewhat improved.  She otherwise denies dyspnea on exertion, orthopnea, PND, pedal edema, chest pain.  Current Outpatient Medications  Medication Sig Dispense Refill   albuterol (VENTOLIN HFA) 108 (90 Base) MCG/ACT inhaler Inhale 2 puffs into the lungs every 6 (six) hours as needed for wheezing or shortness of breath. 6.7 g 12   atorvastatin (LIPITOR) 40 MG tablet Take 40 mg by mouth daily.     Biotin 1000 MCG tablet Take 1 tablet (1 mg total) by mouth daily. 30 tablet 0   Cholecalciferol (VITAMIN D3) 25 MCG (1000 UT) CAPS Take 1 capsule (1,000 Units total) by mouth daily. 30 capsule 0   famotidine (PEPCID) 20 MG tablet Take 20 mg by mouth daily.     folic acid (FOLVITE) 1 MG tablet Take 1 tablet (1 mg total) by mouth daily. 30 tablet 0   gabapentin (NEURONTIN) 300 MG capsule Take 300 mg by mouth at bedtime.   1   ID NOW COVID-19 KIT See admin instructions. for testing     inFLIXimab (REMICADE IV)  Inject 100 mg into the vein every 6 (six) weeks.     inFLIXimab in sodium chloride 0.9 % Inject into the vein. Pt receives this infusion every 6 weeks     irbesartan (AVAPRO) 300 MG tablet Take 1 tablet (300 mg total) by mouth daily. 30 tablet 0   Magnesium 250 MG TABS Take 1 tablet (250 mg total) by mouth daily. 30 tablet 0   Methotrexate Sodium (METHOTREXATE, PF,) 50 MG/2ML injection INJECT 1 ML ONCE WEEKLY. (25 mg)  DISCARD VIAL AFTER USE     pantoprazole (PROTONIX) 40 MG tablet Take 1 tablet (40 mg total) by mouth daily. 30 tablet 0   Polyethyl Glycol-Propyl Glycol (SYSTANE OP) Systane (PF)  prn     Propylene Glycol (SYSTANE BALANCE) 0.6 % SOLN Place 1 drop into both eyes 2 (two) times daily as needed (dry eyes).     sertraline (ZOLOFT) 25 MG tablet Take 50 mg by mouth daily.      VITAMIN A PO Take 2,400 mcg by mouth daily.      vitamin B-12 (CYANOCOBALAMIN) 1000 MCG tablet Take 1 tablet (1,000 mcg total) by mouth daily. 30 tablet 0   naproxen sodium (ALEVE) 220 MG tablet Take 220 mg by mouth 2 (two) times daily as needed (pain). (Patient not taking: Reported on 03/03/2021)     tiotropium (SPIRIVA HANDIHALER) 18 MCG inhalation capsule Place  1 capsule into inhaler and inhale daily. (Patient not taking: Reported on 03/03/2021)     tiZANidine (ZANAFLEX) 4 MG tablet Take 1 tablet (4 mg total) by mouth every 6 (six) hours as needed for muscle spasms. (Patient not taking: Reported on 03/03/2021) 60 tablet 0   No current facility-administered medications for this visit.    Allergies  Allergen Reactions   Codeine Other (See Comments)    Lump in throat   Tape Hives and Rash    Paper tape only   Latex Rash   Nickel Rash    Bumps Also other metals     Past Medical History:  Diagnosis Date   Ankylosing spondylitis (HCC)    Anxiety    Arthritis    RHEUMATOID   Back pain    Bronchitis    Constipation    COPD (chronic obstructive pulmonary disease) ([place])    CXR 01/11/18 showed mild COPD  and chronic bronchitis   CTS (carpal tunnel syndrome)    Dry eye    Dry mouth    Dysrhythmia    "irregularity" unknown at this time - being evaluated by cardiology   Essential hypertension 11/24/2015   GERD (gastroesophageal reflux disease)    HLA B27 (HLA B27 positive)    Hypercholesteremia    Hyperlipidemia 11/24/2015   Hypertension    IBS (irritable bowel syndrome)    Joint pain    Neuropathy    OAB (overactive bladder)    Obesity    Osteoarthritis    Osteoporosis    Pre-diabetes    Rheumatoid arthritis ([place]) 11/24/2015   Sciatica    Seasonal allergies    Spinal stenosis     Past Surgical History:  Procedure Laterality Date   ABDOMINAL HYSTERECTOMY  1995   BACK SURGERY     BREAST SURGERY     REDUCTION   CATARACT EXTRACTION W/PHACO  08/01/2012   Procedure: CATARACT EXTRACTION PHACO AND INTRAOCULAR LENS PLACEMENT ([place]);  Surgeon: Marylynn Pearson, MD;  Location: [facility];  Service: Ophthalmology;  Laterality: Right;   EYE SURGERY Bilateral    cataract   HERNIA REPAIR     RIGHT ING.   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   Procedure: TOTAL KNEE ARTHROPLASTY;  Surgeon: Gaynelle Arabian, MD;  Location: [facility];  Service: Orthopedics;  Laterality: Left;    Social History   Socioeconomic History   Marital status: Divorced    Spouse name: Not on file   Number of children: 1   Years of education: Not on file   Highest education level: Not on file  Occupational History   Occupation: retired  Tobacco Use   Smoking status: Former    Packs/day: 1.00    Years: 35.00    Pack years: 35.00    Types: Cigarettes    Quit date: 05/29/1995    Years since quitting: 25.7   Smokeless tobacco: Never  Vaping Use   Vaping Use: Never used  Substance and Sexual Activity   Alcohol use: Yes    Comment: 1 glass of wine daily   Drug use: No   Sexual activity: Not on file  Other Topics Concern   Not on file  Social History Narrative    The patient is divorced and she has 1 daughter   Originally from [place] she went to college at [employer] but then moved to [place] and 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   Retired from [employer], Electronics engineer.   Moved back to [place] in 1995.   No alcohol 2 caffeinated beverages daily no drug use former smoker no current tobacco   She lives with her daughter and son-in-law.  She has adult grandchildren.   Social Determinants of Health   Financial Resource Strain: Not on file  Food Insecurity: Not on file  Transportation Needs: Not on file  Physical Activity: Not on file  Stress: Not on file  Social Connections: Not on file  Intimate Partner Violence: Not on file    Family History  Problem Relation Age of Onset   Heart disease Mother    Cerebral aneurysm Mother    Hypertension Mother    Obesity Mother    Heart attack Father    Heart disease Father    Hypertension Father    Hypertension Sister    Arthritis Sister    Stroke Sister    Hypertension Sister    Arthritis Sister    Hypertension Sister    Arthritis Sister    Hypertension Sister    Diabetes Sister    Hypertension Sister    Diabetes Sister    Rheum arthritis Maternal Grandmother    Heart attack Daughter     ROS: no fevers or chills, productive cough, hemoptysis, dysphasia, odynophagia, melena, hematochezia, dysuria, hematuria, rash, seizure activity, orthopnea, PND, pedal edema, claudication. Remaining systems are negative.  Physical Exam:   Blood pressure 138/70, pulse 80, height 5' 2"  (1.575 m), weight 190 lb (86.2 kg), SpO2 97 %.  General:  Well developed/well nourished in NAD Skin warm/dry Patient not depressed No peripheral clubbing Back-normal HEENT-normal/normal eyelids Neck supple/normal carotid upstroke bilaterally; no bruits; no JVD; no thyromegaly chest - CTA/ normal expansion CV - RRR/normal S1 and S2; no murmurs, rubs or gallops;   PMI nondisplaced Abdomen -NT/ND, no HSM, no mass, + bowel sounds, no bruit 2+ femoral pulses, no bruits Ext-no edema, chords, 2+ DP Neuro-grossly nonfocal  ECG -February 15, 2021-sinus rhythm with no ST changes.  Personally reviewed  A/P  1 near syncope-episode sounds potentially vagally mediated with a contribution from orthostasis.  It should be noted she has had occasions that she develops dizziness with straining during a bowel movement again making vagal contribution likely.  She has increased her fluid intake and her symptoms have improved.  I will arrange an echocardiogram to assess LV function.  Continue to force fluids.   2 hypertension-blood pressure appears to be controlled.  Continue present medications.  3 hyperlipidemia-continue statin.  Kirk Ruths, MD

## 2021-03-03 ENCOUNTER — Encounter: Payer: Self-pay | Admitting: Cardiology

## 2021-03-03 ENCOUNTER — Other Ambulatory Visit: Payer: Self-pay

## 2021-03-03 ENCOUNTER — Ambulatory Visit (INDEPENDENT_AMBULATORY_CARE_PROVIDER_SITE_OTHER): Payer: Medicare Other | Admitting: Cardiology

## 2021-03-03 VITALS — BP 138/70 | HR 80 | Ht 62.0 in | Wt 190.0 lb

## 2021-03-03 DIAGNOSIS — I10 Essential (primary) hypertension: Secondary | ICD-10-CM | POA: Diagnosis not present

## 2021-03-03 DIAGNOSIS — R55 Syncope and collapse: Secondary | ICD-10-CM

## 2021-03-03 DIAGNOSIS — E7800 Pure hypercholesterolemia, unspecified: Secondary | ICD-10-CM | POA: Diagnosis not present

## 2021-03-03 NOTE — Patient Instructions (Signed)
  Testing/Procedures:  Your physician has requested that you have an echocardiogram. Echocardiography is a painless test that uses sound waves to create images of your heart. It provides your doctor with information about the size and shape of your heart and how well your heart's chambers and valves are working. This procedure takes approximately one hour. There are no restrictions for this procedure. [address]   Follow-Up: At [facility], you and your health needs are our priority.  As part of our continuing mission to provide you with exceptional heart care, we have created designated Provider Care Teams.  These Care Teams include your primary Cardiologist (physician) and Advanced Practice Providers (APPs -  Physician Assistants and Nurse Practitioners) who all work together to provide you with the care you need, when you need it.  We recommend signing up for the patient portal called "MyChart".  Sign up information is provided on this After Visit Summary.  MyChart is used to connect with patients for Virtual Visits (Telemedicine).  Patients are able to view lab/test results, encounter notes, upcoming appointments, etc.  Non-urgent messages can be sent to your provider as well.   To learn more about what you can do with MyChart, go to [email or web address].    Your next appointment:   3 month(s)  The format for your next appointment:   In Person  Provider:   Chilton Si, MD

## 2021-03-06 DIAGNOSIS — M25552 Pain in left hip: Secondary | ICD-10-CM | POA: Diagnosis not present

## 2021-03-08 DIAGNOSIS — M0589 Other rheumatoid arthritis with rheumatoid factor of multiple sites: Secondary | ICD-10-CM | POA: Diagnosis not present

## 2021-03-10 DIAGNOSIS — M25552 Pain in left hip: Secondary | ICD-10-CM | POA: Diagnosis not present

## 2021-03-14 DIAGNOSIS — K219 Gastro-esophageal reflux disease without esophagitis: Secondary | ICD-10-CM | POA: Diagnosis not present

## 2021-03-14 DIAGNOSIS — R1312 Dysphagia, oropharyngeal phase: Secondary | ICD-10-CM | POA: Diagnosis not present

## 2021-03-15 DIAGNOSIS — M25552 Pain in left hip: Secondary | ICD-10-CM | POA: Diagnosis not present

## 2021-03-20 DIAGNOSIS — M25552 Pain in left hip: Secondary | ICD-10-CM | POA: Diagnosis not present

## 2021-03-21 ENCOUNTER — Encounter: Payer: Self-pay | Admitting: Physical Therapy

## 2021-03-21 ENCOUNTER — Other Ambulatory Visit: Payer: Self-pay

## 2021-03-21 ENCOUNTER — Ambulatory Visit: Payer: Medicare Other | Attending: Internal Medicine | Admitting: Physical Therapy

## 2021-03-21 DIAGNOSIS — M5441 Lumbago with sciatica, right side: Secondary | ICD-10-CM | POA: Diagnosis not present

## 2021-03-21 DIAGNOSIS — G8929 Other chronic pain: Secondary | ICD-10-CM | POA: Diagnosis not present

## 2021-03-21 DIAGNOSIS — R293 Abnormal posture: Secondary | ICD-10-CM | POA: Diagnosis not present

## 2021-03-21 DIAGNOSIS — M5442 Lumbago with sciatica, left side: Secondary | ICD-10-CM | POA: Insufficient documentation

## 2021-03-21 DIAGNOSIS — M6281 Muscle weakness (generalized): Secondary | ICD-10-CM | POA: Insufficient documentation

## 2021-03-21 DIAGNOSIS — R279 Unspecified lack of coordination: Secondary | ICD-10-CM | POA: Diagnosis not present

## 2021-03-21 NOTE — Therapy (Signed)
[facility] [address], [place], [place], [address] Phone: [phone]   Fax:  [phone]  Physical Therapy Evaluation  Patient Details  Name: Joy [name] MRN: [id number] Date of Birth: Dec 13, 1940 Referring Provider (PT): Carlean Purl   Encounter Date: 03/21/2021   PT End of Session - 03/21/21 1207     Visit Number 1    Date for PT Re-Evaluation 06/13/21    Authorization Type medicare A/B    PT Start Time 1148    PT Stop Time 1230    PT Time Calculation (min) 42 min    Activity Tolerance Patient tolerated treatment well    Behavior During Therapy [facility] for tasks assessed/performed             Past Medical History:  Diagnosis Date   Ankylosing spondylitis ([place])    Anxiety    Arthritis    RHEUMATOID   Back pain    Bronchitis    Constipation    COPD (chronic obstructive pulmonary disease) ([place])    CXR 01/11/18 showed mild COPD and chronic bronchitis   CTS (carpal tunnel syndrome)    Dry eye    Dry mouth    Dysrhythmia    "irregularity" unknown at this time - being evaluated by cardiology   Essential hypertension 11/24/2015   GERD (gastroesophageal reflux disease)    HLA B27 (HLA B27 positive)    Hypercholesteremia    Hyperlipidemia 11/24/2015   Hypertension    IBS (irritable bowel syndrome)    Joint pain    Neuropathy    OAB (overactive bladder)    Obesity    Osteoarthritis    Osteoporosis    Pre-diabetes    Rheumatoid arthritis ([place]) 11/24/2015   Sciatica    Seasonal allergies    Spinal stenosis     Past Surgical History:  Procedure Laterality Date   ABDOMINAL HYSTERECTOMY  1995   BACK SURGERY     BREAST SURGERY     REDUCTION   CATARACT EXTRACTION W/PHACO  08/01/2012   Procedure: CATARACT EXTRACTION PHACO AND INTRAOCULAR LENS PLACEMENT ([place]);  Surgeon: Marylynn Pearson, MD;  Location: [facility];  Service: Ophthalmology;  Laterality: Right;   EYE SURGERY Bilateral    cataract   HERNIA REPAIR      RIGHT ING.   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   Procedure: TOTAL KNEE ARTHROPLASTY;  Surgeon: Gaynelle Arabian, MD;  [facility];  Service: Orthopedics;  Laterality: Left;    There were no vitals filed for this visit.    Subjective Assessment - 03/21/21 1153     Subjective I used to go once per day.  Three years ago I started taking mirilax on a regular basis.  My BMs are just little squiggle other than 1-2x per month.  Straining sometimes and get dizzy. My back hurts today.  I drink 2-3 coffees a day, flavored water 48 oz/day.    Pertinent History RA, 3 back surgeries, Bil TKA, chronic low back pain, hysterectomy    Patient Stated Goals be able have  good BM 5x/week    Currently in Pain? Yes    Pain Score 8     Pain Location Back    Pain Orientation Lower    Pain Descriptors / Indicators Aching;Sore;Radiating    Pain Type Chronic pain    Pain Radiating Towards pain down the left    Pain  Onset More than a month ago    Pain Frequency Intermittent    Aggravating Factors  not sure    Pain Relieving Factors water aerobics    Multiple Pain Sites No                [facility] PT Assessment - 03/21/21 0001       Assessment   Medical Diagnosis K59.09 (ICD-10-CM) - Chronic constipation;N39.41 (ICD-10-CM) - Urge urinary incontinence    Referring Provider (PT) Gessner    Onset Date/Surgical Date --   3 years   Prior Therapy no      Precautions   Precautions None      Balance Screen   Has the patient fallen in the past 6 months No    Has the patient had a decrease in activity level because of a fear of falling?  No    Is the patient reluctant to leave their home because of a fear of falling?  No      Home Ecologist residence    Living Arrangements Children;Other relatives   daughter and her husband     Prior Function   Level of Independence Independent    IT trainer work     Leisure Counselling psychologist   Overall Cognitive Status Within Functional Limits for tasks assessed      Functional Tests   Functional tests Single leg stance      Single Leg Stance   Comments trendelenburg Lt LE      Posture/Postural Control   Posture/Postural Control Postural limitations    Postural Limitations Rounded Shoulders;Flexed trunk;Anterior pelvic tilt    Posture Comments sticks out Lt hip more weight to left side      Palpation   Palpation comment spinal erectors tight , hip flexors tight      Ambulation/Gait   Gait Pattern Trendelenburg;Trunk flexed                        Objective measurements completed on examination: See above findings.     Pelvic Floor Special Questions - 03/21/21 0001     Urinary Leakage Yes    How often small amount intermittently with urge but my bladder isn't that full    Activities that cause leaking With strong urge    Fecal incontinence No   constipation   Fluid intake 48 oz water and 2-3 coffees    Falling out feeling (prolapse) No    Pelvic Floor Internal Exam pt identity confirmed and internal soft tissue assessed with patient consent    Exam Type Rectal    Palpation low tone activated with tapping and then difficulty relaxing    Strength fair squeeze, definite lift   after several seconds   Strength # of reps 2    Strength # of seconds 7    Tone low                      PT Education - 03/21/21 1719     Education Details toileting techniques    Person(s) Educated Patient    Methods Explanation;Handout    Comprehension Verbalized understanding              PT Short Term Goals - 03/21/21 1727       PT SHORT TERM GOAL #1   Title Independent with initial HEP     Time 4    Period  Weeks    Status New    Target Date 04/18/21               PT Long Term Goals - 03/21/21 1212       PT LONG TERM GOAL #1   Title Patient to be independent with advanced HEP.    Time 12     Period Weeks    Status New    Target Date 06/13/21      PT LONG TERM GOAL #2   Title Pt will report 50% less abominal soreness    Baseline 4-5/10 NPRS    Time 12    Period Weeks    Status New    Target Date 06/13/21      PT LONG TERM GOAL #3   Title Patient to report able to have 5 BMs per week    Time 12    Period Weeks    Status New    Target Date 06/13/21      PT LONG TERM GOAL #4   Title Patient to report tolerance of 45 min of standing/walking without onset of pain.    Time 12    Period Weeks    Status New    Target Date 06/13/21                    Plan - 03/21/21 1720     Clinical Impression Statement Pt is very friendly and active 80 y/o female who has had increased difficulty with BMs over the last several years.  Pt reports reduced activity since onset of Covid in the community.  Pt has flexed posture and tight hip flexors.  Pt has decreased lumbar and thoracic mobility, tight hamstrings, rounded shoulder all effecting her posture and functional movements. Pt has trendelenburg on the Lt LE in single leg and tends to stand in trendelenburg with normal posture as well but corrects slightly when cued.  Pelvic assessment was performed with patient consent and she demonstrates 3/5 MMT that takes several seconds to build up to.  Once muscle engage she does not relax very easily.  Pt will benefit from skilled PT to address all previously mentioned impairments in order to improve bowel function and return to maximum level of activity.    Personal Factors and Comorbidities Comorbidity 3+    Comorbidities RA, 3 back surgeries, Bil TKA, chronic low back pain, hysterectomy    Examination-Activity Limitations Toileting;Continence    Examination-Participation Restrictions Community Activity    Stability/Clinical Decision Making Evolving/Moderate complexity    Clinical Decision Making Moderate    Rehab Potential Excellent    PT Frequency 1x / week    PT Duration 12 weeks     PT Treatment/Interventions ADLs/Self Care Home Management;Aquatic Therapy;Biofeedback;Cryotherapy;Electrical Stimulation;Moist Heat;Therapeutic activities;Therapeutic exercise;Neuromuscular re-education;Taping;Dry needling;Passive range of motion;Manual techniques;Patient/family education    PT Next Visit Plan biofeedback and review toileting techniques    PT Home Exercise Plan gave toileting handout    Consulted and Agree with Plan of Care Patient             Patient will benefit from skilled therapeutic intervention in order to improve the following deficits and impairments:  Pain, Postural dysfunction, Increased fascial restricitons, Decreased strength, Decreased coordination  Visit Diagnosis: Unspecified lack of coordination  Abnormal posture  Muscle weakness (generalized)  Chronic bilateral low back pain with bilateral sciatica     Problem List Patient Active Problem List   Diagnosis Date Noted   Lumbar adjacent segment disease with  spondylolisthesis 02/03/2020   Prediabetes 02/01/2020   Class 1 obesity with serious comorbidity and body mass index (BMI) of 33.0 to 33.9 in adult 02/01/2020   Spinal stenosis of lumbar region 03/04/2019   Mucopurulent chronic bronchitis ([place]) 08/21/2018   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   Acute blood loss as cause of postoperative anemia 06/21/2018   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   Spondylosis 06/13/2018   Status post lumbar spinal fusion 06/13/2018   Status post lumbar spine surgery for decompression of spinal cord 06/13/2018   History of total knee replacement, 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   History of total knee arthroplasty, right 09/20/2017   OA (osteoarthritis) of knee 12/26/2015   Essential hypertension 11/24/2015   Hyperlipidemia 11/24/2015   Rheumatoid arthritis ([place]) 11/24/2015   Lumbar spondylosis 07/21/2014    Jule Ser, PT 03/21/2021, 5:38 PM  [facility] [address], [place], [place], [address] Phone: [phone]   Fax:  [phone]  Name: Joy [name] MRN: [id number] Date of Birth: 1941-05-29

## 2021-03-21 NOTE — Patient Instructions (Signed)

## 2021-03-22 ENCOUNTER — Ambulatory Visit (HOSPITAL_COMMUNITY): Payer: Medicare Other | Attending: Cardiovascular Disease

## 2021-03-22 DIAGNOSIS — R55 Syncope and collapse: Secondary | ICD-10-CM

## 2021-03-22 LAB — ECHOCARDIOGRAM COMPLETE
Area-P 1/2: 3.24 cm2
S' Lateral: 3.3 cm

## 2021-03-23 DIAGNOSIS — I10 Essential (primary) hypertension: Secondary | ICD-10-CM | POA: Diagnosis not present

## 2021-03-23 DIAGNOSIS — M48062 Spinal stenosis, lumbar region with neurogenic claudication: Secondary | ICD-10-CM | POA: Diagnosis not present

## 2021-03-23 DIAGNOSIS — M25552 Pain in left hip: Secondary | ICD-10-CM | POA: Diagnosis not present

## 2021-03-23 DIAGNOSIS — M4316 Spondylolisthesis, lumbar region: Secondary | ICD-10-CM | POA: Diagnosis not present

## 2021-03-23 DIAGNOSIS — Z6836 Body mass index (BMI) 36.0-36.9, adult: Secondary | ICD-10-CM | POA: Diagnosis not present

## 2021-03-27 ENCOUNTER — Ambulatory Visit: Payer: Medicare Other | Admitting: Physical Therapy

## 2021-03-27 ENCOUNTER — Other Ambulatory Visit: Payer: Self-pay

## 2021-03-27 ENCOUNTER — Encounter: Payer: Self-pay | Admitting: Physical Therapy

## 2021-03-27 DIAGNOSIS — M6281 Muscle weakness (generalized): Secondary | ICD-10-CM | POA: Diagnosis not present

## 2021-03-27 DIAGNOSIS — M5442 Lumbago with sciatica, left side: Secondary | ICD-10-CM | POA: Diagnosis not present

## 2021-03-27 DIAGNOSIS — R279 Unspecified lack of coordination: Secondary | ICD-10-CM

## 2021-03-27 DIAGNOSIS — R293 Abnormal posture: Secondary | ICD-10-CM

## 2021-03-27 DIAGNOSIS — G8929 Other chronic pain: Secondary | ICD-10-CM | POA: Diagnosis not present

## 2021-03-27 DIAGNOSIS — M5441 Lumbago with sciatica, right side: Secondary | ICD-10-CM | POA: Diagnosis not present

## 2021-03-27 NOTE — Patient Instructions (Addendum)
Types of Fiber  There are two main types of fiber:  insoluble and soluble.  Both of these types can prevent and relieve constipation and diarrhea, although some people find one or the other to be more easily digested.  This handout details information about both types of fiber.  Insoluble Fiber       Functions of Insoluble Fiber moves bulk through the intestines  controls and balances the pH (acidity) in the intestines       Benefits of Insoluble Fiber promotes regular bowel movement and prevents constipation  removes fecal waste through colon in less time  keeps an optimal pH in intestines to prevent microbes from producing cancer substances, therefore preventing colon cancer        Food Sources of Insoluble Fiber whole-wheat products  wheat bran "miller's bran" corn bran  flax seed or other seeds vegetables such as green beans, broccoli, cauliflower and potato skins  fruit skins and root vegetable skins  popcorn brown rice  Soluble Fiber       Functions of Soluble Fiber  holds water in the colon to bulk and soften the stool prolongs stomach emptying time so that sugar is released and absorbed more slowly        Benefits of Soluble Fiber lowers total cholesterol and LDL cholesterol (the bad cholesterol) therefore reducing the risk of heart disease  regulates blood sugar for people with diabetes       Food Sources of Soluble Fiber oat/oat bran dried beans and peas  nuts  barley  flax seed or other seeds fruits such as oranges, pears, peaches, and apples  vegetables such as carrots  psyllium husk  prunes  .Access Code: [email or web address] URL: [email or web address] Date: 03/27/2021 Prepared by: Dwana Curd  Exercises Hooklying Clamshells with Resistance - 1 x daily - 7 x weekly - 3 sets - 10 reps Supine Figure 4 Piriformis Stretch - 1 x daily - 7 x weekly - 1 sets - 3 reps - 30 sec hold Seated Pelvic Floor Contraction with Isometric Hip Adduction - 3 x  daily - 7 x weekly - 1 sets - 10 reps - 3 sechold, 3 sec rest hold

## 2021-03-27 NOTE — Therapy (Signed)
[facility] [address], [place], [place], [address] Phone: [phone]   Fax:  [phone]  Physical Therapy Treatment  Patient Details  Name: Joy [name] MRN: [id number] Date of Birth: 1941/02/16 Referring Provider (PT): Carlean Purl   Encounter Date: 03/27/2021   PT End of Session - 03/27/21 1410     Visit Number 2    Date for PT Re-Evaluation 06/13/21    Authorization Type medicare A/B    PT Start Time 6226    PT Stop Time 3335    PT Time Calculation (min) 38 min    Activity Tolerance Patient tolerated treatment well    Behavior During Therapy [facility] for tasks assessed/performed             Past Medical History:  Diagnosis Date   Ankylosing spondylitis ([place])    Anxiety    Arthritis    RHEUMATOID   Back pain    Bronchitis    Constipation    COPD (chronic obstructive pulmonary disease) ([place])    CXR 01/11/18 showed mild COPD and chronic bronchitis   CTS (carpal tunnel syndrome)    Dry eye    Dry mouth    Dysrhythmia    "irregularity" unknown at this time - being evaluated by cardiology   Essential hypertension 11/24/2015   GERD (gastroesophageal reflux disease)    HLA B27 (HLA B27 positive)    Hypercholesteremia    Hyperlipidemia 11/24/2015   Hypertension    IBS (irritable bowel syndrome)    Joint pain    Neuropathy    OAB (overactive bladder)    Obesity    Osteoarthritis    Osteoporosis    Pre-diabetes    Rheumatoid arthritis ([place]) 11/24/2015   Sciatica    Seasonal allergies    Spinal stenosis     Past Surgical History:  Procedure Laterality Date   ABDOMINAL HYSTERECTOMY  1995   BACK SURGERY     BREAST SURGERY     REDUCTION   CATARACT EXTRACTION W/PHACO  08/01/2012   Procedure: CATARACT EXTRACTION PHACO AND INTRAOCULAR LENS PLACEMENT ([place]);  Surgeon: Marylynn Pearson, MD;  [facility];  Service: Ophthalmology;  Laterality: Right;   EYE SURGERY Bilateral    cataract   HERNIA REPAIR      RIGHT ING.   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   Procedure: TOTAL KNEE ARTHROPLASTY;  Surgeon: Gaynelle Arabian, MD;  [facility];  Service: Orthopedics;  Laterality: Left;    There were no vitals filed for this visit.   Subjective Assessment - 03/27/21 1407     Subjective Pt states she got the stool and it is helping her have an easier time having a BM    Pertinent History RA, 3 back surgeries, Bil TKA, chronic low back pain, hysterectomy    Patient Stated Goals be able have  good BM 5x/week    Currently in Pain? No/denies                               [facility] Adult PT Treatment/Exercise - 03/27/21 0001       Self-Care   Self-Care Other Self-Care Comments    Other Self-Care Comments  education on types of fiber when resting between ex's      Neuro Re-ed    Neuro Re-ed Details  biofedback used throughout  Lumbar Exercises: Stretches   Figure 4 Stretch 4 reps;10 seconds      Lumbar Exercises: Seated   Other Seated Lumbar Exercises kegel with ball squeeze      Lumbar Exercises: Supine   AB Set Limitations kegel in supine    Clam 1 second;20 reps                    PT Education - 03/27/21 1445     Education Details .Access Code: [email or web address]    Person(s) Educated Patient    Methods Explanation;Demonstration;Tactile cues;Verbal cues;Handout    Comprehension Verbalized understanding;Returned demonstration              PT Short Term Goals - 03/27/21 1431       PT SHORT TERM GOAL #1   Title Independent with initial HEP     Baseline understands toileting techniques               PT Long Term Goals - 03/21/21 1212       PT LONG TERM GOAL #1   Title Patient to be independent with advanced HEP.    Time 12    Period Weeks    Status New    Target Date 06/13/21      PT LONG TERM GOAL #2   Title Pt will report 50% less abominal soreness    Baseline 4-5/10 NPRS     Time 12    Period Weeks    Status New    Target Date 06/13/21      PT LONG TERM GOAL #3   Title Patient to report able to have 5 BMs per week    Time 12    Period Weeks    Status New    Target Date 06/13/21      PT LONG TERM GOAL #4   Title Patient to report tolerance of 45 min of standing/walking without onset of pain.    Time 12    Period Weeks    Status New    Target Date 06/13/21                   Plan - 03/27/21 1438     Clinical Impression Statement Pt did well with initial HEP today.  Biofeedback was helpful fo rher to see what she is doing.  Pt was fatigued and resting tone lowered from 70m to <1 at the end of treatment.  At this time, endurance is the main issue and will re-assess after doing the exercises.  Pt was also briefly educated in types of fiber.    PT Treatment/Interventions ADLs/Self Care Home Management;Aquatic Therapy;Biofeedback;Cryotherapy;Electrical Stimulation;Moist Heat;Therapeutic activities;Therapeutic exercise;Neuromuscular re-education;Taping;Dry needling;Passive range of motion;Manual techniques;Patient/family education    PT Next Visit Plan biofeedback and review toileting techniques    PT Home Exercise Plan .Access Code: [email or web address]    Consulted and Agree with Plan of Care Patient             Patient will benefit from skilled therapeutic intervention in order to improve the following deficits and impairments:  Pain, Postural dysfunction, Increased fascial restricitons, Decreased strength, Decreased coordination  Visit Diagnosis: Unspecified lack of coordination  Abnormal posture  Muscle weakness (generalized)     Problem List Patient Active Problem List   Diagnosis Date Noted   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   Prediabetes 02/01/2020   Class 1 obesity with serious comorbidity and body mass index (BMI) of 33.0 to 33.9 in adult 02/01/2020  Spinal stenosis of lumbar region 03/04/2019    Mucopurulent chronic bronchitis ([place]) 08/21/2018   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   Acute blood loss as cause of postoperative anemia 06/21/2018   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   Spondylosis 06/13/2018   Status post lumbar spinal fusion 06/13/2018   Status post lumbar spine surgery for decompression of spinal cord 06/13/2018   History of total knee replacement, 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   History of total knee arthroplasty, right 09/20/2017   OA (osteoarthritis) of knee 12/26/2015   Essential hypertension 11/24/2015   Hyperlipidemia 11/24/2015   Rheumatoid arthritis ([place]) 11/24/2015   Lumbar spondylosis 07/21/2014    Jule Ser, PT 03/27/2021, 3:28 PM  [facility] [address] [place], [place], [address] Phone: [phone]   Fax:  [phone]  Name: Joy [name] MRN: [id number] Date of Birth: 15-Aug-1941

## 2021-03-28 DIAGNOSIS — M25552 Pain in left hip: Secondary | ICD-10-CM | POA: Diagnosis not present

## 2021-03-30 DIAGNOSIS — M545 Low back pain, unspecified: Secondary | ICD-10-CM | POA: Diagnosis not present

## 2021-03-30 DIAGNOSIS — M48062 Spinal stenosis, lumbar region with neurogenic claudication: Secondary | ICD-10-CM | POA: Diagnosis not present

## 2021-03-30 DIAGNOSIS — M4316 Spondylolisthesis, lumbar region: Secondary | ICD-10-CM | POA: Diagnosis not present

## 2021-03-31 ENCOUNTER — Other Ambulatory Visit (HOSPITAL_BASED_OUTPATIENT_CLINIC_OR_DEPARTMENT_OTHER): Payer: Self-pay | Admitting: Neurosurgery

## 2021-03-31 DIAGNOSIS — M4316 Spondylolisthesis, lumbar region: Secondary | ICD-10-CM

## 2021-03-31 DIAGNOSIS — M545 Low back pain, unspecified: Secondary | ICD-10-CM | POA: Diagnosis not present

## 2021-03-31 DIAGNOSIS — M25552 Pain in left hip: Secondary | ICD-10-CM | POA: Diagnosis not present

## 2021-04-03 ENCOUNTER — Encounter: Payer: Self-pay | Admitting: Pulmonary Disease

## 2021-04-03 ENCOUNTER — Ambulatory Visit (INDEPENDENT_AMBULATORY_CARE_PROVIDER_SITE_OTHER): Payer: Medicare Other | Admitting: Pulmonary Disease

## 2021-04-03 ENCOUNTER — Other Ambulatory Visit: Payer: Self-pay

## 2021-04-03 VITALS — BP 136/66 | HR 81 | Temp 98.5°F | Ht 61.0 in | Wt 195.6 lb

## 2021-04-03 DIAGNOSIS — J209 Acute bronchitis, unspecified: Secondary | ICD-10-CM

## 2021-04-03 DIAGNOSIS — J411 Mucopurulent chronic bronchitis: Secondary | ICD-10-CM

## 2021-04-03 MED ORDER — AMOXICILLIN-POT CLAVULANATE 500-125 MG PO TABS: 1.0000 | ORAL_TABLET | Freq: Two times a day (BID) | ORAL | 0 refills | Status: AC

## 2021-04-03 MED ORDER — ALBUTEROL SULFATE HFA 108 (90 BASE) MCG/ACT IN AERS: 2.0000 | INHALATION_SPRAY | Freq: Four times a day (QID) | RESPIRATORY_TRACT | 2 refills | Status: DC | PRN

## 2021-04-03 MED ORDER — SPIRIVA RESPIMAT 2.5 MCG/ACT IN AERS: 2.0000 | INHALATION_SPRAY | Freq: Every day | RESPIRATORY_TRACT | 0 refills | Status: DC

## 2021-04-03 MED ORDER — SPIRIVA RESPIMAT 2.5 MCG/ACT IN AERS: 2.0000 | INHALATION_SPRAY | Freq: Every day | RESPIRATORY_TRACT | 6 refills | Status: DC

## 2021-04-03 NOTE — Patient Instructions (Addendum)
Acute bronchitis exacerbation --START augmentin 500-125 mg twice a day (reduced dose due to methotrexate use --CONTINUE --CONTINUE albuterol as needed for shortness of breath or wheezing  Follow-up in 6 months with me

## 2021-04-03 NOTE — Progress Notes (Signed)
Synopsis: Referred in 05/2018 for hx of chronic bronchitis x 2 years.   Subjective:   PATIENT ID: Joy [name] GENDER: female DOB: Feb 10, 1941, MRN: [id number]   HPI  Chief Complaint  Patient presents with   Follow-up    Increased of shortness of breath for 1 month , episode of almost passing out hasn't been the same since. ED told her it was dehydration and a UTI- only taking albuterol has thicker mucus and yellow-brown in color.    Joy [name] is an 80 year old female remote smoker with RA on methotrexate and infliximab who presents for chronic bronchitis.  She reports productive cough that began 3-4 weeks that initially started with clear sputum and recently turned yellowish-green brown sputum. Denies hemoptysis. Denies fevers, chills. Her daughter and son-in-law live with her and he has chronic cough. She is no longer on Spiriva due to cost and dis-interest in taking more medications. She is taking albuterol twice a day. Previously had benefit from Spiriva.  Social History: Quit smoking in 1995. Smoked for 30 years x 1ppd.   Past Medical History:  Diagnosis Date   Ankylosing spondylitis ([facility])    Anxiety    Arthritis    RHEUMATOID   Back pain    Bronchitis    Constipation    COPD (chronic obstructive pulmonary disease) ([place])    CXR 01/11/18 showed mild COPD and chronic bronchitis   CTS (carpal tunnel syndrome)    Dry eye    Dry mouth    Dysrhythmia    "irregularity" unknown at this time - being evaluated by cardiology   Essential hypertension 11/24/2015   GERD (gastroesophageal reflux disease)    HLA B27 (HLA B27 positive)    Hypercholesteremia    Hyperlipidemia 11/24/2015   Hypertension    IBS (irritable bowel syndrome)    Joint pain    Neuropathy    OAB (overactive bladder)    Obesity    Osteoarthritis    Osteoporosis    Pre-diabetes    Rheumatoid arthritis ([place]) 11/24/2015   Sciatica    Seasonal allergies    Spinal stenosis     Allergies  Allergen  Reactions   Codeine Other (See Comments)    Lump in throat   Tape Hives and Rash    Paper tape only   Latex Rash   Nickel Rash    Bumps Also other metals     Outpatient Medications Prior to Visit  Medication Sig Dispense Refill   albuterol (VENTOLIN HFA) 108 (90 Base) MCG/ACT inhaler Inhale 2 puffs into the lungs every 6 (six) hours as needed for wheezing or shortness of breath. 6.7 g 12   atorvastatin (LIPITOR) 40 MG tablet Take 40 mg by mouth daily.     Biotin 1000 MCG tablet Take 1 tablet (1 mg total) by mouth daily. 30 tablet 0   Cholecalciferol (VITAMIN D3) 25 MCG (1000 UT) CAPS Take 1 capsule (1,000 Units total) by mouth daily. 30 capsule 0   famotidine (PEPCID) 20 MG tablet Take 20 mg by mouth daily.     folic acid (FOLVITE) 1 MG tablet Take 1 tablet (1 mg total) by mouth daily. 30 tablet 0   gabapentin (NEURONTIN) 300 MG capsule Take 300 mg by mouth at bedtime.   1   ID NOW COVID-19 KIT See admin instructions. for testing     inFLIXimab (REMICADE IV) Inject 100 mg into the vein every 6 (six) weeks.     inFLIXimab in sodium  chloride 0.9 % Inject into the vein. Pt receives this infusion every 6 weeks     irbesartan (AVAPRO) 300 MG tablet Take 1 tablet (300 mg total) by mouth daily. 30 tablet 0   Magnesium 250 MG TABS Take 1 tablet (250 mg total) by mouth daily. 30 tablet 0   Methotrexate Sodium (METHOTREXATE, PF,) 50 MG/2ML injection INJECT 1 ML ONCE WEEKLY. (25 mg)  DISCARD VIAL AFTER USE     naproxen sodium (ALEVE) 220 MG tablet Take 220 mg by mouth 2 (two) times daily as needed (pain). (Patient not taking: Reported on 03/03/2021)     pantoprazole (PROTONIX) 40 MG tablet Take 1 tablet (40 mg total) by mouth daily. 30 tablet 0   Polyethyl Glycol-Propyl Glycol (SYSTANE OP) Systane (PF)  prn     Propylene Glycol (SYSTANE BALANCE) 0.6 % SOLN Place 1 drop into both eyes 2 (two) times daily as needed (dry eyes).     sertraline (ZOLOFT) 25 MG tablet Take 50 mg by mouth daily.       tiotropium (SPIRIVA HANDIHALER) 18 MCG inhalation capsule Place 1 capsule into inhaler and inhale daily. (Patient not taking: Reported on 03/03/2021)     tiZANidine (ZANAFLEX) 4 MG tablet Take 1 tablet (4 mg total) by mouth every 6 (six) hours as needed for muscle spasms. (Patient not taking: Reported on 03/03/2021) 60 tablet 0   VITAMIN A PO Take 2,400 mcg by mouth daily.      vitamin B-12 (CYANOCOBALAMIN) 1000 MCG tablet Take 1 tablet (1,000 mcg total) by mouth daily. 30 tablet 0   No facility-administered medications prior to visit.    Review of Systems  Constitutional:  Negative for chills, diaphoresis, fever, malaise/fatigue and weight loss.  HENT:  Negative for congestion.   Respiratory:  Positive for cough and sputum production. Negative for hemoptysis, shortness of breath and wheezing.   Cardiovascular:  Negative for chest pain, palpitations and leg swelling.   Objective:   Vitals:   04/03/21 1427  BP: 136/66  Pulse: 81  Temp: 98.5 F (36.9 C)  TempSrc: Oral  SpO2: 92%  Weight: 195 lb 9.6 oz (88.7 kg)  Height: _0  (1.549 m)   Physical Exam: General: Well-appearing, no acute distress HENT: [place], AT Eyes: EOMI, no scleral icterus Respiratory: Clear to auscultation bilaterally.  No crackles, wheezing or rales Cardiovascular: RRR, -M/R/G, no JVD Extremities:-Edema,-tenderness Neuro: AAO x4, CNII-XII grossly intact Psych: Normal mood, normal affect  Chest imaging: CXR 01/11/18 - No pulmonary edema, effusion or infiltrate  PFT:  08/21/18 -  FVC 2.4 (154%) FEV1 2.21 (156%) Ratio 74 TLC 104% DLCO corrected 78% Interpretation: Normal spirometry and lung volumes with mildly reduced DLCO  Mild obstructive defect present with mildly reduced DLCO. TLC normal. No significant bronchodilator effect present however does not preclude benefit of bronchodilator therapy.    Assessment & Plan:   80 year old female with RA on methotrexate who presents for follow-up for chronic  bronchitis. Not on bronchodilators. Symptomatic with sputum change.  Acute bronchitis exacerbation --START augmentin 500-125 mg twice a day (reduced dose due to methotrexate use --CONTINUE --CONTINUE albuterol as needed for shortness of breath or wheezing  Return in about 6 months (around 10/04/2021).  I have spent a total time of 32-minutes on the day of the appointment reviewing prior documentation, coordinating care and discussing medical diagnosis and plan with the patient/family. Past medical history, allergies, medications were reviewed. Pertinent imaging, labs and tests included in this note have been reviewed and  interpreted independently by me.   Tasha Diaz Rodman Pickle, MD [facility] 04/03/2021 1:34 PM

## 2021-04-04 DIAGNOSIS — H16223 Keratoconjunctivitis sicca, not specified as Sjogren's, bilateral: Secondary | ICD-10-CM | POA: Diagnosis not present

## 2021-04-04 DIAGNOSIS — H5713 Ocular pain, bilateral: Secondary | ICD-10-CM | POA: Diagnosis not present

## 2021-04-04 DIAGNOSIS — H43813 Vitreous degeneration, bilateral: Secondary | ICD-10-CM | POA: Diagnosis not present

## 2021-04-04 DIAGNOSIS — R519 Headache, unspecified: Secondary | ICD-10-CM | POA: Diagnosis not present

## 2021-04-05 ENCOUNTER — Ambulatory Visit
Admission: RE | Admit: 2021-04-05 | Discharge: 2021-04-05 | Disposition: A | Discharge status: Home / Self Care | Payer: Medicare Other | Source: Ambulatory Visit | Attending: Neurosurgery | Admitting: Neurosurgery

## 2021-04-05 ENCOUNTER — Other Ambulatory Visit: Payer: Self-pay

## 2021-04-05 ENCOUNTER — Ambulatory Visit (HOSPITAL_BASED_OUTPATIENT_CLINIC_OR_DEPARTMENT_OTHER): Payer: Medicare Other

## 2021-04-05 DIAGNOSIS — M4316 Spondylolisthesis, lumbar region: Secondary | ICD-10-CM | POA: Diagnosis not present

## 2021-04-10 ENCOUNTER — Ambulatory Visit: Payer: Medicare Other | Admitting: Physical Therapy

## 2021-04-10 ENCOUNTER — Other Ambulatory Visit: Payer: Self-pay

## 2021-04-10 ENCOUNTER — Encounter: Payer: Self-pay | Admitting: Physical Therapy

## 2021-04-10 DIAGNOSIS — M5441 Lumbago with sciatica, right side: Secondary | ICD-10-CM | POA: Diagnosis not present

## 2021-04-10 DIAGNOSIS — R293 Abnormal posture: Secondary | ICD-10-CM

## 2021-04-10 DIAGNOSIS — R279 Unspecified lack of coordination: Secondary | ICD-10-CM | POA: Diagnosis not present

## 2021-04-10 DIAGNOSIS — M6281 Muscle weakness (generalized): Secondary | ICD-10-CM

## 2021-04-10 DIAGNOSIS — M5442 Lumbago with sciatica, left side: Secondary | ICD-10-CM

## 2021-04-10 DIAGNOSIS — G8929 Other chronic pain: Secondary | ICD-10-CM | POA: Diagnosis not present

## 2021-04-10 NOTE — Therapy (Signed)
[facility] [address], [place], [place], [address] Phone: [phone]   Fax:  [phone]  Physical Therapy Treatment  Patient Details  Name: Joy [name] MRN: [id number] Date of Birth: 21-Jan-1941 Referring Provider (PT): Carlean Purl   Encounter Date: 04/10/2021   PT End of Session - 04/10/21 1107     Visit Number 3    Date for PT Re-Evaluation 06/13/21    Authorization Type medicare A/B    PT Start Time 1105    PT Stop Time 0630    PT Time Calculation (min) 40 min    Activity Tolerance Patient tolerated treatment well    Behavior During Therapy [facility] for tasks assessed/performed             Past Medical History:  Diagnosis Date   Ankylosing spondylitis ([place])    Anxiety    Arthritis    RHEUMATOID   Back pain    Bronchitis    Constipation    COPD (chronic obstructive pulmonary disease) ([place])    CXR 01/11/18 showed mild COPD and chronic bronchitis   CTS (carpal tunnel syndrome)    Dry eye    Dry mouth    Dysrhythmia    "irregularity" unknown at this time - being evaluated by cardiology   Essential hypertension 11/24/2015   GERD (gastroesophageal reflux disease)    HLA B27 (HLA B27 positive)    Hypercholesteremia    Hyperlipidemia 11/24/2015   Hypertension    IBS (irritable bowel syndrome)    Joint pain    Neuropathy    OAB (overactive bladder)    Obesity    Osteoarthritis    Osteoporosis    Pre-diabetes    Rheumatoid arthritis ([place]) 11/24/2015   Sciatica    Seasonal allergies    Spinal stenosis     Past Surgical History:  Procedure Laterality Date   ABDOMINAL HYSTERECTOMY  1995   BACK SURGERY     BREAST SURGERY     REDUCTION   CATARACT EXTRACTION W/PHACO  08/01/2012   Procedure: CATARACT EXTRACTION PHACO AND INTRAOCULAR LENS PLACEMENT ([place]);  Surgeon: Marylynn Pearson, MD;  Location: [facility];  Service: Ophthalmology;  Laterality: Right;   EYE SURGERY Bilateral    cataract   HERNIA REPAIR      RIGHT ING.   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   Procedure: TOTAL KNEE ARTHROPLASTY;  Surgeon: Gaynelle Arabian, MD;  [facility];  Service: Orthopedics;  Laterality: Left;    There were no vitals filed for this visit.   Subjective Assessment - 04/10/21 1110     Subjective Pt states she has low back and Lt leg pain and it has been hurting so much so she hasn't been doing the exercises. I have been.  I have been having 1 BM every day but it is not complete.    Pertinent History RA, 3 back surgeries, Bil TKA, chronic low back pain, hysterectomy    Patient Stated Goals be able have  good BM 5x/week    Currently in Pain? Yes    Pain Score 2     Pain Location Back    Pain Orientation Lower;Left    Pain Onset More than a month ago                               [facility]  Adult PT Treatment/Exercise - 04/10/21 0001       Exercises   Exercises Lumbar      Lumbar Exercises: Stretches   Hip Flexor Stretch Right;Left;3 reps;30 seconds      Lumbar Exercises: Standing   Other Standing Lumbar Exercises shoulder flexion and ext with core and pelvic floor engaged - VC and TC for posture - 20x yellow band      Lumbar Exercises: Supine   AB Set Limitations kegel in supine    Other Supine Lumbar Exercises leg lengthening press in supine      Manual Therapy   Manual Therapy Myofascial release    Myofascial Release abdominal myofascial release around large intestine                      PT Short Term Goals - 03/27/21 1431       PT SHORT TERM GOAL #1   Title Independent with initial HEP     Baseline understands toileting techniques               PT Long Term Goals - 04/10/21 1300       PT LONG TERM GOAL #1   Title Patient to be independent with advanced HEP.    Status On-going      PT LONG TERM GOAL #2   Title Pt will report 50% less abominal soreness    Status On-going      PT  LONG TERM GOAL #3   Title Patient to report able to have 5 BMs per week    Baseline yes but no complete    Status On-going      PT LONG TERM GOAL #4   Title Patient to report tolerance of 45 min of standing/walking without onset of pain.    Status On-going      PT LONG TERM GOAL #5   Title decrease pain 25%    Status On-going                   Plan - 04/10/21 1257     Clinical Impression Statement Pt did well with exercises today.  Pt is working on posture and is stlil very flexed which creates excess tenion through her back. STanding exercises added to HEP for posture and core strength.  Pt has been having improved nubmer of BMs but not fully complete.  PT focused on fascial release throughout the abdomen around the large intestine to assist with imporve mobility.    Comorbidities RA, 3 back surgeries, Bil TKA, chronic low back pain, hysterectomy    Examination-Activity Limitations Toileting;Continence    PT Treatment/Interventions ADLs/Self Care Home Management;Aquatic Therapy;Biofeedback;Cryotherapy;Electrical Stimulation;Moist Heat;Therapeutic activities;Therapeutic exercise;Neuromuscular re-education;Taping;Dry needling;Passive range of motion;Manual techniques;Patient/family education    PT Next Visit Plan biofeedback if needed continue core strength and posture    PT Home Exercise Plan .Access Code: [email or web address]    Consulted and Agree with Plan of Care Patient             Patient will benefit from skilled therapeutic intervention in order to improve the following deficits and impairments:  Pain, Postural dysfunction, Increased fascial restricitons, Decreased strength, Decreased coordination  Visit Diagnosis: Unspecified lack of coordination  Abnormal posture  Muscle weakness (generalized)  Chronic bilateral low back pain with bilateral sciatica     Problem List Patient Active Problem List   Diagnosis Date Noted   Lumbar adjacent segment disease with  spondylolisthesis 02/03/2020   Prediabetes 02/01/2020   Class  1 obesity with serious comorbidity and body mass index (BMI) of 33.0 to 33.9 in adult 02/01/2020   Spinal stenosis of lumbar region 03/04/2019   Mucopurulent chronic bronchitis ([place]) 08/21/2018   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   Acute blood loss as cause of postoperative anemia 06/21/2018   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   Spondylosis 06/13/2018   Status post lumbar spinal fusion 06/13/2018   Status post lumbar spine surgery for decompression of spinal cord 06/13/2018   History of total knee replacement, 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   History of total knee arthroplasty, right 09/20/2017   OA (osteoarthritis) of knee 12/26/2015   Essential hypertension 11/24/2015   Hyperlipidemia 11/24/2015   Rheumatoid arthritis ([place]) 11/24/2015   Lumbar spondylosis 07/21/2014    Jule Ser, PT 04/10/2021, 1:01 PM  [facility] [address] [place], [place], [address] Phone: [phone]   Fax:  [phone]  Name: Joy [name] MRN: [id number] Date of Birth: 1941-04-26

## 2021-04-11 ENCOUNTER — Telehealth: Payer: Self-pay | Admitting: Pulmonary Disease

## 2021-04-11 NOTE — Telephone Encounter (Signed)
Called and spoke with pt to see if she has tried any OTC cough meds and she said she hadn't as she didn't know what would be okay for her to take. Stated to pt that she could try either robitussin or delsym and she verbalized understanding. Stated if she still didn't receive any relief to call us back and she verbalized understanding. Nothing further needed.

## 2021-04-17 ENCOUNTER — Encounter: Payer: Medicare Other | Admitting: Physical Therapy

## 2021-04-17 DIAGNOSIS — Z6836 Body mass index (BMI) 36.0-36.9, adult: Secondary | ICD-10-CM | POA: Diagnosis not present

## 2021-04-17 DIAGNOSIS — M48062 Spinal stenosis, lumbar region with neurogenic claudication: Secondary | ICD-10-CM | POA: Diagnosis not present

## 2021-04-17 DIAGNOSIS — I10 Essential (primary) hypertension: Secondary | ICD-10-CM | POA: Diagnosis not present

## 2021-04-19 DIAGNOSIS — M0589 Other rheumatoid arthritis with rheumatoid factor of multiple sites: Secondary | ICD-10-CM | POA: Diagnosis not present

## 2021-04-25 ENCOUNTER — Encounter: Payer: Medicare Other | Admitting: Physical Therapy

## 2021-04-26 ENCOUNTER — Telehealth: Payer: Self-pay | Admitting: Pulmonary Disease

## 2021-04-26 NOTE — Telephone Encounter (Signed)
Called patient but she did not answer. Left message for her to call us back.  

## 2021-04-26 NOTE — Telephone Encounter (Signed)
Please schedule patient a follow-up with Dr. Everardo All on Monday September 12th. Continue Spiriva, prn albuterol and robitussin.

## 2021-04-26 NOTE — Telephone Encounter (Signed)
Pt returning missed call. 

## 2021-04-26 NOTE — Telephone Encounter (Signed)
Next Appt With Pulmonology (Chi Mechele Collin, MD)05/01/2021 at 11:15 AM Nothing further needed

## 2021-04-26 NOTE — Telephone Encounter (Signed)
Spoke with the pt  She states continues to have cough - last seen 04/03/21 and given augmentin 500 bid  She has noticed cough slightly worse past 2 wks and she is producing some dark green to brown sputum  She has occ wheezing, but no SOB, chest tightness  She denies f/c/s, aches  Still taking the spiriva and using her albuterol about once per day on average  She has taken both Delsym and Robitussin without any relief  Please advise thanks!  Allergies  Allergen Reactions   Codeine Other (See Comments)    Lump in throat   Tape Hives and Rash    Paper tape only   Latex Rash   Nickel Rash    Bumps Also other metals

## 2021-04-26 NOTE — Telephone Encounter (Signed)
I have called the pt and LM on VM for her to call back. Will need OV scheduled on 09/12 with JE.

## 2021-05-01 ENCOUNTER — Ambulatory Visit (INDEPENDENT_AMBULATORY_CARE_PROVIDER_SITE_OTHER): Payer: Medicare Other | Admitting: Pulmonary Disease

## 2021-05-01 ENCOUNTER — Other Ambulatory Visit: Payer: Self-pay

## 2021-05-01 ENCOUNTER — Encounter: Payer: Self-pay | Admitting: Pulmonary Disease

## 2021-05-01 VITALS — BP 130/76 | HR 70 | Temp 97.5°F | Ht 62.0 in | Wt 194.8 lb

## 2021-05-01 DIAGNOSIS — J42 Unspecified chronic bronchitis: Secondary | ICD-10-CM | POA: Diagnosis not present

## 2021-05-01 MED ORDER — SPIRIVA RESPIMAT 2.5 MCG/ACT IN AERS: 2.0000 | INHALATION_SPRAY | Freq: Every day | RESPIRATORY_TRACT | 6 refills | Status: DC

## 2021-05-01 MED ORDER — SPIRIVA RESPIMAT 2.5 MCG/ACT IN AERS: 2.0000 | INHALATION_SPRAY | Freq: Every day | RESPIRATORY_TRACT | 0 refills | Status: DC

## 2021-05-01 MED ORDER — DEXTROMETHORPHAN-GUAIFENESIN 10-100 MG/5ML PO SYRP: 5.0000 mL | ORAL_SOLUTION | Freq: Two times a day (BID) | ORAL | 1 refills | Status: DC

## 2021-05-01 MED ORDER — PREDNISONE 20 MG PO TABS: 40.0000 mg | ORAL_TABLET | Freq: Every day | ORAL | 0 refills | Status: AC

## 2021-05-01 NOTE — Patient Instructions (Signed)
Acute on chronic bronchitis  --START prednisone 40 mg once a day for five days --START Spiriva 2.5 mcg TWO puffs ONCE a day --CONTINUE Albuterol every 4 hours as needed for shortness of breath or wheezing.  Follow-up with me in 3 months

## 2021-05-01 NOTE — Progress Notes (Signed)
Synopsis: Referred in 05/2018 for hx of chronic bronchitis x 2 years.   Subjective:   PATIENT ID: Joy [name] GENDER: female DOB: 1941/02/23, MRN: [id number]   HPI  Chief Complaint  Patient presents with   Follow-up    Cough    [name] is an 80 year old female remote smoker with RA on methotrexate and infliximab who presents for follow-up  05/01/21 In the last two weeks she has had worsening productive cough with sputum that is brown. Occasional wheezing. Allergies and changes in weather worsens her symptoms. She uses her albuterol twice a day. Previously on Spiriva which she had benefit in the past. Has fatigued.  Social History: Quit smoking in 1995. Smoked for 30 years x 1ppd.   Past Medical History:  Diagnosis Date   Ankylosing spondylitis (HCC)    Anxiety    Arthritis    RHEUMATOID   Back pain    Bronchitis    Constipation    COPD (chronic obstructive pulmonary disease) ([place])    CXR 01/11/18 showed mild COPD and chronic bronchitis   CTS (carpal tunnel syndrome)    Dry eye    Dry mouth    Dysrhythmia    "irregularity" unknown at this time - being evaluated by cardiology   Essential hypertension 11/24/2015   GERD (gastroesophageal reflux disease)    HLA B27 (HLA B27 positive)    Hypercholesteremia    Hyperlipidemia 11/24/2015   Hypertension    IBS (irritable bowel syndrome)    Joint pain    Neuropathy    OAB (overactive bladder)    Obesity    Osteoarthritis    Osteoporosis    Pre-diabetes    Rheumatoid arthritis ([place]) 11/24/2015   Sciatica    Seasonal allergies    Spinal stenosis     Allergies  Allergen Reactions   Codeine Other (See Comments)    Lump in throat   Tape Hives and Rash    Paper tape only   Latex Rash   Nickel Rash    Bumps Also other metals     Outpatient Medications Prior to Visit  Medication Sig Dispense Refill   albuterol (VENTOLIN HFA) 108 (90 Base) MCG/ACT inhaler Inhale 2 puffs into the lungs every 6 (six) hours as  needed for wheezing or shortness of breath. 6.7 g 2   atorvastatin (LIPITOR) 40 MG tablet Take 40 mg by mouth daily.     Biotin 1000 MCG tablet Take 1 tablet (1 mg total) by mouth daily. 30 tablet 0   Cholecalciferol (VITAMIN D3) 25 MCG (1000 UT) CAPS Take 1 capsule (1,000 Units total) by mouth daily. 30 capsule 0   famotidine (PEPCID) 20 MG tablet Take 20 mg by mouth daily.     folic acid (FOLVITE) 1 MG tablet Take 1 tablet (1 mg total) by mouth daily. 30 tablet 0   gabapentin (NEURONTIN) 300 MG capsule Take 300 mg by mouth at bedtime.   1   ID NOW COVID-19 KIT See admin instructions. for testing     inFLIXimab (REMICADE IV) Inject 100 mg into the vein every 6 (six) weeks.     inFLIXimab in sodium chloride 0.9 % Inject into the vein. Pt receives this infusion every 6 weeks     irbesartan (AVAPRO) 300 MG tablet Take 1 tablet (300 mg total) by mouth daily. 30 tablet 0   Magnesium 250 MG TABS Take 1 tablet (250 mg total) by mouth daily. 30 tablet 0   Methotrexate  Sodium (METHOTREXATE, PF,) 50 MG/2ML injection INJECT 1 ML ONCE WEEKLY. (25 mg)  DISCARD VIAL AFTER USE     naproxen sodium (ALEVE) 220 MG tablet Take 220 mg by mouth 2 (two) times daily as needed (pain).     pantoprazole (PROTONIX) 40 MG tablet Take 1 tablet (40 mg total) by mouth daily. 30 tablet 0   Polyethyl Glycol-Propyl Glycol (SYSTANE OP) Systane (PF)  prn     Propylene Glycol (SYSTANE BALANCE) 0.6 % SOLN Place 1 drop into both eyes 2 (two) times daily as needed (dry eyes).     sertraline (ZOLOFT) 25 MG tablet Take 50 mg by mouth daily.      Tiotropium Bromide Monohydrate (SPIRIVA RESPIMAT) 2.5 MCG/ACT AERS Inhale 2 puffs into the lungs daily. 4 g 6   VITAMIN A PO Take 2,400 mcg by mouth daily.      vitamin B-12 (CYANOCOBALAMIN) 1000 MCG tablet Take 1 tablet (1,000 mcg total) by mouth daily. 30 tablet 0   Tiotropium Bromide Monohydrate (SPIRIVA RESPIMAT) 2.5 MCG/ACT AERS Inhale 2 puffs into the lungs daily. 4 g 0   No  facility-administered medications prior to visit.    Review of Systems  Constitutional:  Positive for malaise/fatigue. Negative for chills, diaphoresis, fever and weight loss.  HENT:  Negative for congestion.   Respiratory:  Positive for cough, sputum production, shortness of breath and wheezing. Negative for hemoptysis.   Cardiovascular:  Negative for chest pain, palpitations and leg swelling.   Objective:   Vitals:   05/01/21 1118  BP: 130/76  Pulse: 70  Temp: (!) 97.5 F (36.4 C)  TempSrc: Oral  SpO2: 98%  Weight: 194 lb 12.8 oz (88.4 kg)  Height: _0  (1.575 m)   Physical Exam: General: Well-appearing, no acute distress HENT: [place], AT Eyes: EOMI, no scleral icterus Respiratory: Clear to auscultation bilaterally.  No crackles, wheezing or rales Cardiovascular: RRR, -M/R/G, no JVD Extremities:-Edema,-tenderness Neuro: AAO x4, CNII-XII grossly intact Psych: Normal mood, normal affect   Chest imaging: CXR 01/11/18 - No pulmonary edema, effusion or infiltrate  PFT:  08/21/18 -  FVC 2.4 (154%) FEV1 2.21 (156%) Ratio 74 TLC 104% DLCO corrected 78% Interpretation: Normal spirometry and lung volumes with mildly reduced DLCO  Mild obstructive defect present with mildly reduced DLCO. TLC normal. No significant bronchodilator effect present however does not preclude benefit of bronchodilator therapy.    Assessment & Plan:   80 year old female with RA on methotrexate who presents for follow-up of chronic bronchitis. Continues to be symptomatic. Discussed bronchodilator management since she had benefit with Spiriva in the past.  Acute on chronic bronchitis  --START prednisone 40 mg once a day for five days --START Spiriva 2.5 mcg TWO puffs ONCE a day --CONTINUE Albuterol every 4 hours as needed for shortness of breath or wheezing --Cough syrup ordered: Robitussin-DM  Return in about 3 months (around 07/31/2021).  I have spent a total time of 32-minutes on the day of the  appointment reviewing prior documentation, coordinating care and discussing medical diagnosis and plan with the patient/family. Past medical history, allergies, medications were reviewed. Pertinent imaging, labs and tests included in this note have been reviewed and interpreted independently by me.  Landen Knoedler Rodman Pickle, MD [facility] 05/01/2021 11:30 AM

## 2021-05-02 ENCOUNTER — Ambulatory Visit: Payer: Medicare Other | Attending: Family Medicine | Admitting: Physical Therapy

## 2021-05-02 ENCOUNTER — Encounter: Payer: Self-pay | Admitting: Physical Therapy

## 2021-05-02 DIAGNOSIS — M5442 Lumbago with sciatica, left side: Secondary | ICD-10-CM | POA: Diagnosis not present

## 2021-05-02 DIAGNOSIS — M6281 Muscle weakness (generalized): Secondary | ICD-10-CM | POA: Insufficient documentation

## 2021-05-02 DIAGNOSIS — R293 Abnormal posture: Secondary | ICD-10-CM | POA: Insufficient documentation

## 2021-05-02 DIAGNOSIS — G8929 Other chronic pain: Secondary | ICD-10-CM | POA: Diagnosis not present

## 2021-05-02 DIAGNOSIS — M5441 Lumbago with sciatica, right side: Secondary | ICD-10-CM | POA: Insufficient documentation

## 2021-05-02 DIAGNOSIS — R279 Unspecified lack of coordination: Secondary | ICD-10-CM | POA: Diagnosis not present

## 2021-05-02 NOTE — Therapy (Signed)
[facility] [address], [place], [place], [address] Phone: [phone]   Fax:  [phone]  Physical Therapy Treatment  Patient Details  Name: Joy [name] MRN: [id number] Date of Birth: November 25, 1940 Referring Provider (PT): Carlean Purl   Encounter Date: 05/02/2021   PT End of Session - 05/02/21 1116     Visit Number 4    Date for PT Re-Evaluation 06/13/21    Authorization Type medicare A/B    PT Start Time 1105    PT Stop Time 8242    PT Time Calculation (min) 40 min    Activity Tolerance Patient tolerated treatment well    Behavior During Therapy [facility] for tasks assessed/performed             Past Medical History:  Diagnosis Date   Ankylosing spondylitis ([place])    Anxiety    Arthritis    RHEUMATOID   Back pain    Bronchitis    Constipation    COPD (chronic obstructive pulmonary disease) ([place])    CXR 01/11/18 showed mild COPD and chronic bronchitis   CTS (carpal tunnel syndrome)    Dry eye    Dry mouth    Dysrhythmia    "irregularity" unknown at this time - being evaluated by cardiology   Essential hypertension 11/24/2015   GERD (gastroesophageal reflux disease)    HLA B27 (HLA B27 positive)    Hypercholesteremia    Hyperlipidemia 11/24/2015   Hypertension    IBS (irritable bowel syndrome)    Joint pain    Neuropathy    OAB (overactive bladder)    Obesity    Osteoarthritis    Osteoporosis    Pre-diabetes    Rheumatoid arthritis ([place]) 11/24/2015   Sciatica    Seasonal allergies    Spinal stenosis     Past Surgical History:  Procedure Laterality Date   ABDOMINAL HYSTERECTOMY  1995   BACK SURGERY     BREAST SURGERY     REDUCTION   CATARACT EXTRACTION W/PHACO  08/01/2012   Procedure: CATARACT EXTRACTION PHACO AND INTRAOCULAR LENS PLACEMENT ([place]);  Surgeon: Marylynn Pearson, MD;  Location: [facility];  Service: Ophthalmology;  Laterality: Right;   EYE SURGERY Bilateral    cataract   HERNIA REPAIR      RIGHT ING.   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   Procedure: TOTAL KNEE ARTHROPLASTY;  Surgeon: Gaynelle Arabian, MD;  Location: [facility];  Service: Orthopedics;  Laterality: Left;    There were no vitals filed for this visit.   Subjective Assessment - 05/02/21 1110     Subjective Pt states she got the squatty potty and that helps a lot.My abdomen is sore    Patient Stated Goals be able have  good BM 5x/week    Currently in Pain? No/denies                               [facility] Adult PT Treatment/Exercise - 05/02/21 0001       Lumbar Exercises: Stretches   Figure 4 Stretch 4 reps;10 seconds      Lumbar Exercises: Standing   Other Standing Lumbar Exercises standing with leaning on table to isolate kegel - 10x      Lumbar Exercises: Supine   AB Set Limitations kegel in supine   tactile cues and VC to not  use gluteals   Bent Knee Raise 20 reps    Other Supine Lumbar Exercises press up 4lb with core engaged    Other Supine Lumbar Exercises abduction and adduction - isometric - 10x each      Manual Therapy   Manual Therapy Muscle Energy Technique;Myofascial release    Myofascial Release abdominal myofascial release around large intestine    Muscle Energy Technique Lt posterior rotation                       PT Short Term Goals - 03/27/21 1431       PT SHORT TERM GOAL #1   Title Independent with initial HEP     Baseline understands toileting techniques               PT Long Term Goals - 05/02/21 1112       PT LONG TERM GOAL #1   Title Patient to be independent with advanced HEP.    Status On-going      PT LONG TERM GOAL #2   Title Pt will report 50% less abominal soreness    Status On-going      PT LONG TERM GOAL #3   Title Patient to report able to have 5 BMs per week    Baseline 5/week and they are more complete      PT LONG TERM GOAL #4   Title Patient to report  tolerance of 45 min of standing/walking without onset of pain.    Status On-going                   Plan - 05/02/21 1135     Clinical Impression Statement Pt had a setback due to covid last week. Pt has however met long term goal for improved bowel movements.  Lately, she is having more leakage of bladder due to urgency.  Pt was able to do kegel exercises and focus more on isolating the pelvic floor muscles.  Pt did well with leaning on table and when given cues to become aware of when she was overusing the gluteal muscles.  Pt will benefit from skilled PT to continue to address core and pelvic strength and coordination for improved functional activities without leakage.    PT Treatment/Interventions ADLs/Self Care Home Management;Aquatic Therapy;Biofeedback;Cryotherapy;Electrical Stimulation;Moist Heat;Therapeutic activities;Therapeutic exercise;Neuromuscular re-education;Taping;Dry needling;Passive range of motion;Manual techniques;Patient/family education    PT Next Visit Plan biofeedback if needed continue core strength and posture    PT Home Exercise Plan .Access Code: [email or web address]    Consulted and Agree with Plan of Care Patient             Patient will benefit from skilled therapeutic intervention in order to improve the following deficits and impairments:  Pain, Postural dysfunction, Increased fascial restricitons, Decreased strength, Decreased coordination  Visit Diagnosis: Unspecified lack of coordination  Abnormal posture  Muscle weakness (generalized)  Chronic bilateral low back pain with bilateral sciatica     Problem List Patient Active Problem List   Diagnosis Date Noted   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   Prediabetes 02/01/2020   Class 1 obesity with serious comorbidity and body mass index (BMI) of 33.0 to 33.9 in adult 02/01/2020   Spinal stenosis of lumbar region 03/04/2019   Mucopurulent chronic bronchitis ([place])  08/21/2018   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   Acute blood loss as cause of postoperative anemia 06/21/2018   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   Spondylosis  06/13/2018   Status post lumbar spinal fusion 06/13/2018   Status post lumbar spine surgery for decompression of spinal cord 06/13/2018   History of total knee replacement, 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   History of total knee arthroplasty, right 09/20/2017   OA (osteoarthritis) of knee 12/26/2015   Essential hypertension 11/24/2015   Hyperlipidemia 11/24/2015   Rheumatoid arthritis ([place]) 11/24/2015   Lumbar spondylosis 07/21/2014    Jule Ser, PT 05/02/2021, 12:25 PM  [facility] [address] [place], [place], [address] Phone: [phone]   Fax:  [phone]  Name: Joy [name] MRN: [id number] Date of Birth: Sep 09, 1940

## 2021-05-04 ENCOUNTER — Encounter: Payer: Self-pay | Admitting: Pulmonary Disease

## 2021-05-04 DIAGNOSIS — Z79899 Other long term (current) drug therapy: Secondary | ICD-10-CM | POA: Diagnosis not present

## 2021-05-04 DIAGNOSIS — M0579 Rheumatoid arthritis with rheumatoid factor of multiple sites without organ or systems involvement: Secondary | ICD-10-CM | POA: Diagnosis not present

## 2021-05-04 DIAGNOSIS — E669 Obesity, unspecified: Secondary | ICD-10-CM | POA: Diagnosis not present

## 2021-05-04 DIAGNOSIS — Z6836 Body mass index (BMI) 36.0-36.9, adult: Secondary | ICD-10-CM | POA: Diagnosis not present

## 2021-05-04 DIAGNOSIS — M150 Primary generalized (osteo)arthritis: Secondary | ICD-10-CM | POA: Diagnosis not present

## 2021-05-04 DIAGNOSIS — Z1589 Genetic susceptibility to other disease: Secondary | ICD-10-CM | POA: Diagnosis not present

## 2021-05-04 DIAGNOSIS — M2550 Pain in unspecified joint: Secondary | ICD-10-CM | POA: Diagnosis not present

## 2021-05-08 DIAGNOSIS — Z23 Encounter for immunization: Secondary | ICD-10-CM | POA: Diagnosis not present

## 2021-05-09 ENCOUNTER — Ambulatory Visit: Payer: Medicare Other | Admitting: Physical Therapy

## 2021-05-09 ENCOUNTER — Other Ambulatory Visit: Payer: Self-pay

## 2021-05-09 DIAGNOSIS — M5442 Lumbago with sciatica, left side: Secondary | ICD-10-CM | POA: Diagnosis not present

## 2021-05-09 DIAGNOSIS — G8929 Other chronic pain: Secondary | ICD-10-CM | POA: Diagnosis not present

## 2021-05-09 DIAGNOSIS — M6281 Muscle weakness (generalized): Secondary | ICD-10-CM | POA: Diagnosis not present

## 2021-05-09 DIAGNOSIS — R293 Abnormal posture: Secondary | ICD-10-CM

## 2021-05-09 DIAGNOSIS — R279 Unspecified lack of coordination: Secondary | ICD-10-CM

## 2021-05-09 DIAGNOSIS — M5441 Lumbago with sciatica, right side: Secondary | ICD-10-CM | POA: Diagnosis not present

## 2021-05-09 NOTE — Therapy (Signed)
[facility] [address], [place], [place], [address] Phone: [phone]   Fax:  [phone]  Physical Therapy Treatment  Patient Details  Name: Joy [name] MRN: [id number] Date of Birth: November 30, 1940 Referring Provider (PT): Carlean Purl   Encounter Date: 05/09/2021   PT End of Session - 05/09/21 1201     Visit Number 5    Date for PT Re-Evaluation 06/13/21    Authorization Type medicare A/B    PT Start Time 1100    PT Stop Time 2751    PT Time Calculation (min) 45 min    Activity Tolerance Patient tolerated treatment well    Behavior During Therapy [facility] for tasks assessed/performed             Past Medical History:  Diagnosis Date   Ankylosing spondylitis ([place])    Anxiety    Arthritis    RHEUMATOID   Back pain    Bronchitis    Constipation    COPD (chronic obstructive pulmonary disease) ([place])    CXR 01/11/18 showed mild COPD and chronic bronchitis   CTS (carpal tunnel syndrome)    Dry eye    Dry mouth    Dysrhythmia    "irregularity" unknown at this time - being evaluated by cardiology   Essential hypertension 11/24/2015   GERD (gastroesophageal reflux disease)    HLA B27 (HLA B27 positive)    Hypercholesteremia    Hyperlipidemia 11/24/2015   Hypertension    IBS (irritable bowel syndrome)    Joint pain    Neuropathy    OAB (overactive bladder)    Obesity    Osteoarthritis    Osteoporosis    Pre-diabetes    Rheumatoid arthritis ([place]) 11/24/2015   Sciatica    Seasonal allergies    Spinal stenosis     Past Surgical History:  Procedure Laterality Date   ABDOMINAL HYSTERECTOMY  1995   BACK SURGERY     BREAST SURGERY     REDUCTION   CATARACT EXTRACTION W/PHACO  08/01/2012   Procedure: CATARACT EXTRACTION PHACO AND INTRAOCULAR LENS PLACEMENT ([place]);  Surgeon: Marylynn Pearson, MD;  Location: [facility];  Service: Ophthalmology;  Laterality: Right;   EYE SURGERY Bilateral    cataract   HERNIA REPAIR      RIGHT ING.   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   Procedure: TOTAL KNEE ARTHROPLASTY;  Surgeon: Gaynelle Arabian, MD;  [facility];  Service: Orthopedics;  Laterality: Left;    There were no vitals filed for this visit.   Subjective Assessment - 05/09/21 1104     Subjective Pt states the left abdomen is more sore on the left and the pelvic area is a little better than usual.  Pt states still not having regular BMs.    Patient Stated Goals be able have  good BM 5x/week    Currently in Pain? No/denies                               [facility] Adult PT Treatment/Exercise - 05/09/21 0001       Lumbar Exercises: Stretches   Other Lumbar Stretch Exercise ball roll out flexion stretch      Lumbar Exercises: Aerobic   Nustep L2 x 10 min with core activated      Lumbar Exercises: Sidelying   Clam Right;Left;15 reps  with kegel     Manual Therapy   Manual Therapy Soft tissue mobilization    Soft tissue mobilization lumbar paraspinals with lumbar MFR and traction                       PT Short Term Goals - 03/27/21 1431       PT SHORT TERM GOAL #1   Title Independent with initial HEP     Baseline understands toileting techniques               PT Long Term Goals - 05/09/21 1104       PT LONG TERM GOAL #1   Title Patient to be independent with advanced HEP.    Status On-going      PT LONG TERM GOAL #2   Title Pt will report 50% less abominal soreness    Baseline 40-50% iomproved    Status Partially Met      PT LONG TERM GOAL #3   Title Patient to report able to have 5 BMs per week    Baseline 5/week and they are mostly complete but not back to where it used to    Status Achieved      PT LONG TERM GOAL #4   Title Patient to report tolerance of 45 min of standing/walking without onset of pain.    Baseline with a shopping cart I can walk more than 30 minutes    Status  Partially Met      PT LONG TERM GOAL #5   Title decrease pain 25%    Baseline 40% less pain    Status Achieved                   Plan - 05/09/21 1144     Clinical Impression Statement Pt did well with exercises today.  Pt responded well with lumbar stretch and felt better afterwards.  Gave stretch for HEP to continue to release lumbar paraspinals.  Overall, she has partially met several long term goals as of today.  She is expected to need 1-2 more visits to ensure ind with HEP so she can continue to work at home with HEP    PT Treatment/Interventions ADLs/Self Care Home Management;Aquatic Therapy;Biofeedback;Cryotherapy;Electrical Stimulation;Moist Heat;Therapeutic activities;Therapeutic exercise;Neuromuscular re-education;Taping;Dry needling;Passive range of motion;Manual techniques;Patient/family education    PT Next Visit Plan core and posture strength, lumbar and hip release and stretch    PT Home Exercise Plan .Access Code: [email or web address]    Consulted and Agree with Plan of Care Patient             Patient will benefit from skilled therapeutic intervention in order to improve the following deficits and impairments:  Pain, Postural dysfunction, Increased fascial restricitons, Decreased strength, Decreased coordination  Visit Diagnosis: Unspecified lack of coordination  Abnormal posture  Muscle weakness (generalized)     Problem List Patient Active Problem List   Diagnosis Date Noted   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   Prediabetes 02/01/2020   Class 1 obesity with serious comorbidity and body mass index (BMI) of 33.0 to 33.9 in adult 02/01/2020   Spinal stenosis of lumbar region 03/04/2019   Chronic bronchitis ([place]) 09/25/2018   Mucopurulent chronic bronchitis ([place]) 08/21/2018   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   Acute blood loss as cause of postoperative anemia 06/21/2018   Polyneuropathy  06/21/2018   Spondylosis 06/13/2018   Status post lumbar spinal fusion 06/13/2018  Status post lumbar spine surgery for decompression of spinal cord 06/13/2018   History of total knee replacement, 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   History of total knee arthroplasty, right 09/20/2017   OA (osteoarthritis) of knee 12/26/2015   Essential hypertension 11/24/2015   Hyperlipidemia 11/24/2015   Rheumatoid arthritis ([place]) 11/24/2015   Lumbar spondylosis 07/21/2014    Jule Ser, PT 05/09/2021, 12:31 PM   [facility] [address] [place], [place], [address] Phone: [phone]   Fax:  [phone]  Name: DEMIKA LANGENDERFER MRN: [id number] Date of Birth: [birth year]

## 2021-05-15 DIAGNOSIS — Z23 Encounter for immunization: Secondary | ICD-10-CM | POA: Diagnosis not present

## 2021-05-16 ENCOUNTER — Ambulatory Visit: Payer: Medicare Other | Admitting: Physical Therapy

## 2021-05-18 DIAGNOSIS — R200 Anesthesia of skin: Secondary | ICD-10-CM | POA: Diagnosis not present

## 2021-05-18 DIAGNOSIS — R29898 Other symptoms and signs involving the musculoskeletal system: Secondary | ICD-10-CM | POA: Diagnosis not present

## 2021-05-23 ENCOUNTER — Encounter: Payer: Self-pay | Admitting: Physical Therapy

## 2021-05-23 ENCOUNTER — Ambulatory Visit: Payer: Medicare Other | Attending: Internal Medicine | Admitting: Physical Therapy

## 2021-05-23 ENCOUNTER — Other Ambulatory Visit: Payer: Self-pay

## 2021-05-23 DIAGNOSIS — M6281 Muscle weakness (generalized): Secondary | ICD-10-CM | POA: Insufficient documentation

## 2021-05-23 DIAGNOSIS — M5442 Lumbago with sciatica, left side: Secondary | ICD-10-CM | POA: Insufficient documentation

## 2021-05-23 DIAGNOSIS — R279 Unspecified lack of coordination: Secondary | ICD-10-CM | POA: Insufficient documentation

## 2021-05-23 DIAGNOSIS — R293 Abnormal posture: Secondary | ICD-10-CM | POA: Insufficient documentation

## 2021-05-23 DIAGNOSIS — M5441 Lumbago with sciatica, right side: Secondary | ICD-10-CM | POA: Diagnosis not present

## 2021-05-23 DIAGNOSIS — G8929 Other chronic pain: Secondary | ICD-10-CM | POA: Insufficient documentation

## 2021-05-23 NOTE — Therapy (Signed)
[facility] @ [place], [place], [address] Phone:     Fax:     Physical Therapy Treatment  Patient Details  Name: Joy [name] MRN: [id number] Date of Birth: 01-03-1941 Referring Provider (PT): Carlean Purl   Encounter Date: 05/23/2021   PT End of Session - 05/23/21 1114     Visit Number 6    Date for PT Re-Evaluation 06/13/21    Authorization Type medicare A/B    PT Start Time 1110   late   PT Stop Time 1148    PT Time Calculation (min) 38 min    Activity Tolerance Patient tolerated treatment well    Behavior During Therapy [facility] for tasks assessed/performed             Past Medical History:  Diagnosis Date   Ankylosing spondylitis ([place])    Anxiety    Arthritis    RHEUMATOID   Back pain    Bronchitis    Constipation    COPD (chronic obstructive pulmonary disease) ([place])    CXR 01/11/18 showed mild COPD and chronic bronchitis   CTS (carpal tunnel syndrome)    Dry eye    Dry mouth    Dysrhythmia    "irregularity" unknown at this time - being evaluated by cardiology   Essential hypertension 11/24/2015   GERD (gastroesophageal reflux disease)    HLA B27 (HLA B27 positive)    Hypercholesteremia    Hyperlipidemia 11/24/2015   Hypertension    IBS (irritable bowel syndrome)    Joint pain    Neuropathy    OAB (overactive bladder)    Obesity    Osteoarthritis    Osteoporosis    Pre-diabetes    Rheumatoid arthritis ([place]) 11/24/2015   Sciatica    Seasonal allergies    Spinal stenosis     Past Surgical History:  Procedure Laterality Date   ABDOMINAL HYSTERECTOMY  1995   BACK SURGERY     BREAST SURGERY     REDUCTION   CATARACT EXTRACTION W/PHACO  08/01/2012   Procedure: CATARACT EXTRACTION PHACO AND INTRAOCULAR LENS PLACEMENT ([place]);  Surgeon: Marylynn Pearson, MD;  Location: [place];  Service: Ophthalmology;  Laterality: Right;   EYE SURGERY Bilateral    cataract   HERNIA REPAIR     RIGHT ING.    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   Procedure: TOTAL KNEE ARTHROPLASTY;  Surgeon: Gaynelle Arabian, MD;  [facility];  Service: Orthopedics;  Laterality: Left;    There were no vitals filed for this visit.   Subjective Assessment - 05/23/21 1112     Subjective I have been doing really well with the BMs and pelvic issues. I had a nerve conduction test and back pain on the left side.  Overall, doing really well    Patient Stated Goals be able have  good BM 5x/week    Currently in Pain? Yes    Pain Score 6     Pain Location Back    Pain Orientation Left;Lower    Pain Descriptors / Indicators Aching;Sore    Pain Type Chronic pain    Pain Radiating Towards into the left hip this morning    Pain Onset More than a month ago    Pain Frequency Intermittent    Aggravating Factors  didn't take medicine this morning or it would be better  Multiple Pain Sites No                               [facility] Adult PT Treatment/Exercise - 05/23/21 0001       Lumbar Exercises: Stretches   Hip Flexor Stretch Right;Left;3 reps;30 seconds    Other Lumbar Stretch Exercise ball roll out flexion stretch and side bend - in standing      Lumbar Exercises: Machines for Strengthening   Other Lumbar Machine Exercise hip machine abduction 1 plate 10x each side      Lumbar Exercises: Standing   Other Standing Lumbar Exercises band - ext and row - 20x      Lumbar Exercises: Sidelying   Other Sidelying Lumbar Exercises side stretch and forward on peanut      Manual Therapy   Soft tissue mobilization lumbar paraspinals with lumbar MFR and traction, gluteals on left side                       PT Short Term Goals - 03/27/21 1431       PT SHORT TERM GOAL #1   Title Independent with initial HEP     Baseline understands toileting techniques               PT Long Term Goals - 05/23/21 1116       PT  LONG TERM GOAL #1   Title Patient to be independent with advanced HEP.    Status On-going      PT LONG TERM GOAL #2   Title Pt will report 50% less abominal soreness    Baseline at least 50% better    Status Achieved      PT LONG TERM GOAL #3   Title Patient to report able to have 5 BMs per week    Baseline I am having BMs every day but not always completely normal but much better    Status Achieved      PT LONG TERM GOAL #4   Title Patient to report tolerance of 45 min of standing/walking without onset of pain.    Status Partially Met      PT LONG TERM GOAL #5   Title decrease pain 25%    Baseline 40% less pain    Status Achieved                   Plan - 05/23/21 1313     Clinical Impression Statement Pt still having back and hip pain but is doing much better with BMs.  Pt will benefit from skilled PT to continue to work on posture and core strengthening as well as pain management so she can maintain improved posture for longer periods of time.  Pt needed cues throughout session for reduced forward leaning.    PT Treatment/Interventions ADLs/Self Care Home Management;Aquatic Therapy;Biofeedback;Cryotherapy;Electrical Stimulation;Moist Heat;Therapeutic activities;Therapeutic exercise;Neuromuscular re-education;Taping;Dry needling;Passive range of motion;Manual techniques;Patient/family education    PT Next Visit Plan core and posture strength, lumbar and hip release and stretch    PT Home Exercise Plan .Access Code: [email or web address]    Consulted and Agree with Plan of Care Patient             Patient will benefit from skilled therapeutic intervention in order to improve the following deficits and impairments:  Pain, Postural dysfunction, Increased fascial restricitons, Decreased strength, Decreased coordination  Visit Diagnosis: Unspecified lack of coordination  Abnormal posture  Muscle weakness (generalized)  Chronic bilateral low back pain with bilateral  sciatica     Problem List Patient Active Problem List   Diagnosis Date Noted   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   Prediabetes 02/01/2020   Class 1 obesity with serious comorbidity and body mass index (BMI) of 33.0 to 33.9 in adult 02/01/2020   Spinal stenosis of lumbar region 03/04/2019   Chronic bronchitis ([place]) 09/25/2018   Mucopurulent chronic bronchitis ([place]) 08/21/2018   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   Acute blood loss as cause of postoperative anemia 06/21/2018   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   Spondylosis 06/13/2018   Status post lumbar spinal fusion 06/13/2018   Status post lumbar spine surgery for decompression of spinal cord 06/13/2018   History of total knee replacement, 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   History of total knee arthroplasty, right 09/20/2017   OA (osteoarthritis) of knee 12/26/2015   Essential hypertension 11/24/2015   Hyperlipidemia 11/24/2015   Rheumatoid arthritis ([place]) 11/24/2015   Lumbar spondylosis 07/21/2014    Jule Ser, PT 05/23/2021, 1:35 PM  [facility] @ [place], [place], [address] Phone:     Fax:     Name: Joy [name] MRN: [id number] Date of Birth: 1941/02/25

## 2021-05-25 DIAGNOSIS — M533 Sacrococcygeal disorders, not elsewhere classified: Secondary | ICD-10-CM | POA: Diagnosis not present

## 2021-05-30 ENCOUNTER — Encounter: Payer: Medicare Other | Admitting: Physical Therapy

## 2021-05-30 DIAGNOSIS — E559 Vitamin D deficiency, unspecified: Secondary | ICD-10-CM | POA: Diagnosis not present

## 2021-05-30 DIAGNOSIS — I10 Essential (primary) hypertension: Secondary | ICD-10-CM | POA: Diagnosis not present

## 2021-05-30 DIAGNOSIS — G629 Polyneuropathy, unspecified: Secondary | ICD-10-CM | POA: Diagnosis not present

## 2021-05-30 DIAGNOSIS — R1310 Dysphagia, unspecified: Secondary | ICD-10-CM | POA: Diagnosis not present

## 2021-05-30 DIAGNOSIS — R2681 Unsteadiness on feet: Secondary | ICD-10-CM | POA: Diagnosis not present

## 2021-05-30 DIAGNOSIS — F419 Anxiety disorder, unspecified: Secondary | ICD-10-CM | POA: Diagnosis not present

## 2021-05-30 DIAGNOSIS — K219 Gastro-esophageal reflux disease without esophagitis: Secondary | ICD-10-CM | POA: Diagnosis not present

## 2021-05-30 DIAGNOSIS — M069 Rheumatoid arthritis, unspecified: Secondary | ICD-10-CM | POA: Diagnosis not present

## 2021-05-30 DIAGNOSIS — R7301 Impaired fasting glucose: Secondary | ICD-10-CM | POA: Diagnosis not present

## 2021-05-30 DIAGNOSIS — K5900 Constipation, unspecified: Secondary | ICD-10-CM | POA: Diagnosis not present

## 2021-05-30 DIAGNOSIS — E7800 Pure hypercholesterolemia, unspecified: Secondary | ICD-10-CM | POA: Diagnosis not present

## 2021-06-02 DIAGNOSIS — M461 Sacroiliitis, not elsewhere classified: Secondary | ICD-10-CM | POA: Diagnosis not present

## 2021-06-05 ENCOUNTER — Telehealth: Payer: Self-pay | Admitting: Pulmonary Disease

## 2021-06-05 ENCOUNTER — Other Ambulatory Visit: Payer: Self-pay

## 2021-06-05 ENCOUNTER — Encounter (HOSPITAL_BASED_OUTPATIENT_CLINIC_OR_DEPARTMENT_OTHER): Payer: Self-pay | Admitting: Cardiovascular Disease

## 2021-06-05 ENCOUNTER — Ambulatory Visit (INDEPENDENT_AMBULATORY_CARE_PROVIDER_SITE_OTHER): Payer: Medicare Other | Admitting: Cardiovascular Disease

## 2021-06-05 DIAGNOSIS — Z6835 Body mass index (BMI) 35.0-35.9, adult: Secondary | ICD-10-CM

## 2021-06-05 DIAGNOSIS — E669 Obesity, unspecified: Secondary | ICD-10-CM | POA: Diagnosis not present

## 2021-06-05 DIAGNOSIS — E7800 Pure hypercholesterolemia, unspecified: Secondary | ICD-10-CM | POA: Diagnosis not present

## 2021-06-05 DIAGNOSIS — I10 Essential (primary) hypertension: Secondary | ICD-10-CM | POA: Diagnosis not present

## 2021-06-05 NOTE — Telephone Encounter (Signed)
She will need a clinic or video visit with next available NP. If nothing available this week, OK to schedule 15 min visit with me next week. OK to override any AM slots in my clinic

## 2021-06-05 NOTE — Progress Notes (Signed)
Cardiology Office Note   Date:  06/05/2021   ID:  Zennie, Ayars [birth year], MRN [id number]  PCP:  Marda Stalker, PA-C  Cardiologist:   Orion Crook   No chief complaint on file.    History of Present Illness: Joy [name] is a 80 y.o. female with hypertension, hyperlipidemia, and COPD here for follow up.  She was first seen 11/2015 for an evaluation of pre-surgical cardiovascular risk prior to knee surgery.  [name] has a strong family history of cardiac disease and was noted to have EKG changes.  [name] fell and fractured her L tibial plateau on 09/02/15. In pre-op clearance appointment she had an EKG that revealed nonspecific ST depression. She was asymptomatic but not very active so she was referred for nuclear stress testing 11/2015 that revealed LVEF 54% and no ischemia.   She reported palpitations and wore a 24 hour Holter that showed occasional PACs and PVCs.    At her last appointment, she was doing well. She saw Dr. Stanford Breed for an urgent visit 02/2021 for pre-syncope. She had been seen in the ED and her episode was thought to be orthostatic. Dr. Stanford Breed was also concerned about a vasovagal component. She had an Echo 03/2021 with LVEF 60-65% and normal diastolic function. She had aortic sclerosis without stenosis.  Today, she is doing fine overall. In 12/2019 she had back surgery that has helped her back pain. However, she now has pain in her bilateral LEs, but it is worse on her left side. This pain is felt constantly. Due to this pain, she is not able to sleep on her left side and, some mornings, she is unable to walk. She reports also feeling pain in her bilateral shoulders. Since her last visit, she has been diagnosed with COPD.  She notes that she did previously smoke.  She measures her blood pressure at home and reports readings in the 482N systolic. She takes the majority of her medications in the morning. However, she did not take her antihypertensives this  morning.  She does not always take them at a specific time.  She was given a cough medication but she discontinued it because she did not notice any improvements to her condition. She is still able to drive and, for exercise, she goes to the [facility] for yoga and water aerobics. She also attends physical therapy which has helped with her pain. She was infected with Covid but this has been resolved. She denies swelling in her LE. She denies any palpitations, chest pain, or shortness of breath. No lightheadedness, headaches, syncope, orthopnea, or PND. Also has no exertional symptoms.   Past Medical History:  Diagnosis Date   Ankylosing spondylitis ([facility])    Anxiety    Arthritis    RHEUMATOID   Back pain    Bronchitis    Constipation    COPD (chronic obstructive pulmonary disease) ([place])    CXR 01/11/18 showed mild COPD and chronic bronchitis   CTS (carpal tunnel syndrome)    Dry eye    Dry mouth    Dysrhythmia    "irregularity" unknown at this time - being evaluated by cardiology   Essential hypertension 11/24/2015   GERD (gastroesophageal reflux disease)    HLA B27 (HLA B27 positive)    Hypercholesteremia    Hyperlipidemia 11/24/2015   Hypertension    IBS (irritable bowel syndrome)    Joint pain    Neuropathy    OAB (overactive bladder)    Obesity  Osteoarthritis    Osteoporosis    Pre-diabetes    Rheumatoid arthritis ([place]) 11/24/2015   Sciatica    Seasonal allergies    Spinal stenosis     Past Surgical History:  Procedure Laterality Date   ABDOMINAL HYSTERECTOMY  1995   BACK SURGERY     BREAST SURGERY     REDUCTION   CATARACT EXTRACTION W/PHACO  08/01/2012   Procedure: CATARACT EXTRACTION PHACO AND INTRAOCULAR LENS PLACEMENT ([place]);  Surgeon: Marylynn Pearson, MD;  Location: [facility];  Service: Ophthalmology;  Laterality: Right;   EYE SURGERY Bilateral    cataract   HERNIA REPAIR     RIGHT ING.   JOINT REPLACEMENT  2014   rt total knee   TONSILLECTOMY     TOTAL KNEE ARTHROPLASTY  Left 12/26/2015   Procedure: TOTAL KNEE ARTHROPLASTY;  Surgeon: Gaynelle Arabian, MD;  [facility];  Service: Orthopedics;  Laterality: Left;     Current Outpatient Medications  Medication Sig Dispense Refill   albuterol (VENTOLIN HFA) 108 (90 Base) MCG/ACT inhaler Inhale 2 puffs into the lungs every 6 (six) hours as needed for wheezing or shortness of breath. 6.7 g 2   atorvastatin (LIPITOR) 40 MG tablet Take 40 mg by mouth daily.     Biotin 1000 MCG tablet Take 1 tablet (1 mg total) by mouth daily. 30 tablet 0   Cholecalciferol (VITAMIN D3) 25 MCG (1000 UT) CAPS Take 1 capsule (1,000 Units total) by mouth daily. 30 capsule 0   Dextromethorphan-guaiFENesin 10-100 MG/5ML liquid Take 5 mLs by mouth every 12 (twelve) hours. 118 mL 1   famotidine (PEPCID) 20 MG tablet Take 20 mg by mouth daily.     folic acid (FOLVITE) 1 MG tablet Take 1 tablet (1 mg total) by mouth daily. 30 tablet 0   gabapentin (NEURONTIN) 300 MG capsule Take 300 mg by mouth at bedtime.   1   ID NOW COVID-19 KIT See admin instructions. for testing     inFLIXimab (REMICADE IV) Inject 100 mg into the vein every 6 (six) weeks.     inFLIXimab in sodium chloride 0.9 % Inject into the vein. Pt receives this infusion every 6 weeks     irbesartan (AVAPRO) 300 MG tablet Take 1 tablet (300 mg total) by mouth daily. 30 tablet 0   Magnesium 250 MG TABS Take 1 tablet (250 mg total) by mouth daily. 30 tablet 0   Methotrexate Sodium (METHOTREXATE, PF,) 50 MG/2ML injection INJECT 1 ML ONCE WEEKLY. (25 mg)  DISCARD VIAL AFTER USE     naproxen sodium (ALEVE) 220 MG tablet Take 220 mg by mouth 2 (two) times daily as needed (pain).     pantoprazole (PROTONIX) 40 MG tablet Take 1 tablet (40 mg total) by mouth daily. 30 tablet 0   Polyethyl Glycol-Propyl Glycol (SYSTANE OP) Systane (PF)  prn     Propylene Glycol (SYSTANE BALANCE) 0.6 % SOLN Place 1 drop into both eyes 2 (two) times daily as needed (dry eyes).     sertraline (ZOLOFT) 25 MG tablet  Take 50 mg by mouth daily.      Tiotropium Bromide Monohydrate (SPIRIVA RESPIMAT) 2.5 MCG/ACT AERS Inhale 2 puffs into the lungs daily. 4 g 6   VITAMIN A PO Take 2,400 mcg by mouth daily.      vitamin B-12 (CYANOCOBALAMIN) 1000 MCG tablet Take 1 tablet (1,000 mcg total) by mouth daily. 30 tablet 0   No current facility-administered medications for this visit.    Allergies:  Codeine, Tape, Latex, and Nickel    Social History:  The patient  reports that she quit smoking about 26 years ago. Her smoking use included cigarettes. She has a 35.00 pack-year smoking history. She has never used smokeless tobacco. She reports current alcohol use. She reports that she does not use drugs.   Family History:  The patient's family history includes Arthritis in her sister, sister, and sister; Cerebral aneurysm in her mother; Diabetes in her sister and sister; Heart attack in her daughter and father; Heart disease in her father and mother; Hypertension in her father, mother, sister, sister, sister, sister, and sister; Obesity in her mother; Rheum arthritis in her maternal grandmother; Stroke in her sister.    ROS:  Please see the history of present illness.    (+) Back pain (+) Bilateral LE pain (+) Bilateral shoulder pain (+) Coughing All other systems are reviewed and negative.    PHYSICAL EXAM: VS:  BP (!) 154/78 (BP Location: Right Arm, Patient Position: Sitting, Cuff Size: Large)   Pulse 76   Ht 5' 2"  (1.575 m)   Wt 193 lb 9.6 oz (87.8 kg)   SpO2 99%   BMI 35.41 kg/m  , BMI Body mass index is 35.41 kg/m. GENERAL:  Well appearing HEENT:  Pupils equal round and reactive, fundi not visualized, oral mucosa unremarkable NECK:  No jugular venous distention, waveform within normal limits, carotid upstroke brisk and symmetric, no bruits LUNGS:  Clear to auscultation bilaterally HEART:  RRR.  PMI not displaced or sustained,S1 and S2 within normal limits, no S3, no S4, no clicks, no rubs, no  murmurs ABD:  Flat, positive bowel sounds normal in frequency in pitch, no bruits, no rebound, no guarding, no midline pulsatile mass, no hepatomegaly, no splenomegaly EXT:  2 plus pulses throughout, no edema, no cyanosis no clubbing SKIN:  No rashes no nodules NEURO:  Cranial nerves II through XII grossly intact, motor grossly intact throughout PSYCH:  Cognitively intact, oriented to person place and time   EKG:   06/05/2021: EKG was not taken this visit.  11/27/19: Sinus rhythm.  Rate 72 bpm. 12/04/15: sinus rhythm. Rate 83 bpm. Left anterior fascicular block. Poor R-wave progression. Unspecific ST/T changes.  Echo 03/22/2021 1. Left ventricular ejection fraction, by estimation, is 60 to 65%. The  left ventricle has normal function. The left ventricle has no regional  wall motion abnormalities. There is mild left ventricular hypertrophy.  Left ventricular diastolic parameters  were normal.   2. Right ventricular systolic function is normal. The right ventricular  size is normal.   3. Left atrial size was mildly dilated.   4. The mitral valve is abnormal. Trivial mitral valve regurgitation. No  evidence of mitral stenosis.   5. The aortic valve is tricuspid. There is moderate calcification of the  aortic valve. Aortic valve regurgitation is not visualized. Mild to  moderate aortic valve sclerosis/calcification is present, without any  evidence of aortic stenosis.   6. The inferior vena cava is normal in size with greater than 50%  respiratory variability, suggesting right atrial pressure of 3 mmHg.   Lexiscan Myoview 12/02/15: Normal Lexiscan myoview Nuclear stress EF: 54%. There was no ST segment deviation noted during stress. The study is normal. This is a low risk study.   24 Hour Holter Monitor 11/2015:   Quality: Fair.  Baseline artifact.   Average heart rate: 79 bpm Max heart rate: 111 bpm Min heart rate: 60 bpm Pauses >2.5 seconds: 0  Occasional PACs and  PVCs  Recent Labs: 02/15/2021: BUN 23; Creatinine, Ser 0.88; Hemoglobin 11.9; Platelets 233; Potassium 3.6; Sodium 138    Lipid Panel No results found for: CHOL, TRIG, HDL, CHOLHDL, VLDL, LDLCALC, LDLDIRECT    Wt Readings from Last 3 Encounters:  06/05/21 193 lb 9.6 oz (87.8 kg)  05/01/21 194 lb 12.8 oz (88.4 kg)  04/03/21 195 lb 9.6 oz (88.7 kg)      ASSESSMENT AND PLAN: Essential hypertension Blood pressure was elevated today but she notes that she has not yet taken her irbesartan.  She is going to start trying to take it more regularly and check her blood pressures at home.  She will follow-up with our pharmacist in a month or 2 and bring her machine to that visit.  Hyperlipidemia Ms. Carlis Abbott has been taking atorvastatin for hyperlipidemia.  However she has diffuse muscle aches which may be at least partially attributable to her RA and underlying orthopedic issues.  However I suspect that the atorvastatin may also be causing myalgias.  She will try holding it for couple weeks to see if this helps.  If it does we will need to consider an alternative agent.  If not she will resume it.  Obesity She was encouraged to get back into her regular exercise routine.   The following changes have been made:  no change  Labs/ tests ordered today include:   Orders Placed This Encounter  Procedures   AMB Referral to [facility] Pharm-D      Disposition:   FU with Hedy Garro C. Oval Linsey, MD, [facility] in 1 year.  Pharm.D. in 1-2 months.    This note was written with the assistance of speech recognition software.  Please excuse any transcriptional errors.   I,Mykaella Javier,acting as a scribe for Skeet Latch, MD.,have documented all relevant documentation on the behalf of Skeet Latch, MD,as directed by  Skeet Latch, MD while in the presence of Skeet Latch, MD.  I, Ribera Oval Linsey, MD have reviewed all documentation for this visit.  The documentation of the exam,  diagnosis, procedures, and orders on 06/05/2021 are all accurate and complete.   Signed, Kenlei Safi C. Oval Linsey, MD, [facility]  06/05/2021 10:48 AM    [facility]

## 2021-06-05 NOTE — Assessment & Plan Note (Signed)
Blood pressure was elevated today but she notes that she has not yet taken her irbesartan.  She is going to start trying to take it more regularly and check her blood pressures at home.  She will follow-up with our pharmacist in a month or 2 and bring her machine to that visit.

## 2021-06-05 NOTE — Assessment & Plan Note (Signed)
She was encouraged to get back into her regular exercise routine.

## 2021-06-05 NOTE — Telephone Encounter (Signed)
Called and spoke with pt letting her know the info stated by JE and she verbalized understanding. Appt scheduled for pt tomorrow 10/18 with BW. Nothing further needed.

## 2021-06-05 NOTE — Telephone Encounter (Signed)
Spoke with pt  who states her cough has gotten deeper since last OV in 05/01/21. Pt states cough is still productive despite using Sprivia daily and albuterol as needed. Pt completed prednisone taper which she said did not help. Pt denies any N/V/D/F/C. Dr. Everardo All please advise.

## 2021-06-05 NOTE — Assessment & Plan Note (Signed)
[name] has been taking atorvastatin for hyperlipidemia.  However she has diffuse muscle aches which may be at least partially attributable to her RA and underlying orthopedic issues.  However I suspect that the atorvastatin may also be causing myalgias.  She will try holding it for couple weeks to see if this helps.  If it does we will need to consider an alternative agent.  If not she will resume it.

## 2021-06-05 NOTE — Patient Instructions (Signed)
Medication Instructions:  HOLD YOUR ATORVASTATIN UNTIL FOLLOW UP WITH PHARM D   *If you need a refill on your cardiac medications before your next appointment, please call your pharmacy*  Lab Work: NONE   Testing/Procedures: NONE  Follow-Up: At [facility], you and your health needs are our priority.  As part of our continuing mission to provide you with exceptional heart care, we have created designated Provider Care Teams.  These Care Teams include your primary Cardiologist (physician) and Advanced Practice Providers (APPs -  Physician Assistants and Nurse Practitioners) who all work together to provide you with the care you need, when you need it.  We recommend signing up for the patient portal called "MyChart".  Sign up information is provided on this After Visit Summary.  MyChart is used to connect with patients for Virtual Visits (Telemedicine).  Patients are able to view lab/test results, encounter notes, upcoming appointments, etc.  Non-urgent messages can be sent to your provider as well.   To learn more about what you can do with MyChart, go to [email or web address].    Your next appointment:   12 month(s)  The format for your next appointment:   In Person  Provider:   Chilton Si, MD  Your physician recommends that you schedule a follow-up appointment in:1-2 MONTHS WITH PHARM D  Other Instructions  MONITOR BLOOD PRESSURE DAILY AND LOG BRING LOG AND MACHINE TO FOLLOW UP IN 1-2 MONTHS

## 2021-06-06 ENCOUNTER — Ambulatory Visit (INDEPENDENT_AMBULATORY_CARE_PROVIDER_SITE_OTHER): Payer: Medicare Other | Admitting: Primary Care

## 2021-06-06 ENCOUNTER — Ambulatory Visit (INDEPENDENT_AMBULATORY_CARE_PROVIDER_SITE_OTHER): Payer: Medicare Other

## 2021-06-06 ENCOUNTER — Encounter: Payer: Self-pay | Admitting: Primary Care

## 2021-06-06 ENCOUNTER — Encounter: Payer: Medicare Other | Admitting: Primary Care

## 2021-06-06 VITALS — BP 124/82 | HR 80 | Temp 98.3°F | Ht 62.0 in | Wt 191.8 lb

## 2021-06-06 DIAGNOSIS — J411 Mucopurulent chronic bronchitis: Secondary | ICD-10-CM | POA: Diagnosis not present

## 2021-06-06 DIAGNOSIS — J209 Acute bronchitis, unspecified: Secondary | ICD-10-CM

## 2021-06-06 DIAGNOSIS — Z981 Arthrodesis status: Secondary | ICD-10-CM | POA: Diagnosis not present

## 2021-06-06 DIAGNOSIS — J42 Unspecified chronic bronchitis: Secondary | ICD-10-CM | POA: Diagnosis not present

## 2021-06-06 DIAGNOSIS — R918 Other nonspecific abnormal finding of lung field: Secondary | ICD-10-CM | POA: Diagnosis not present

## 2021-06-06 MED ORDER — AZITHROMYCIN 250 MG PO TABS: ORAL_TABLET | ORAL | 0 refills | Status: DC

## 2021-06-06 MED ORDER — BENZONATATE 200 MG PO CAPS: 200.0000 mg | ORAL_CAPSULE | Freq: Three times a day (TID) | ORAL | 1 refills | Status: DC | PRN

## 2021-06-06 NOTE — Progress Notes (Signed)
@Patient  ID: Joy [name], female    DOB: December 12, 1940, 80 y.o.   MRN: [id number]  Chief Complaint  Patient presents with   Follow-up    Pt. Says she has severe cough.     Referring provider: Marda Stalker, PA-C  HPI: 80 year old female, former smoker quit 1996 (35-pack-year history).  Past medical history significant for chronic bronchitis, rheumatoid arthritis (on methotrexate and infliximab), hypertension, hyperlipidemia, obesity. Patient of Dr. Loanne Drilling, last seen in office on 05/01/2021.  Previous [facility] pulmonary encounter: 05/01/21 In the last two weeks she has had worsening productive cough with sputum that is brown. Occasional wheezing. Allergies and changes in weather worsens her symptoms. She uses her albuterol twice a day. Previously on Spiriva which she had benefit in the past. Has fatigued.  Social History: Quit smoking in 1995. Smoked for 30 years x 1ppd.   06/06/2021- Interim hx  Patient presents today for acute office visit with reports of cough. .She has seen no improvement in her cough since starting Spiriva Respimat 2.72mg or taking prednisone 475mx 5 days. Cough remains productive with dark green mucus. Rarely experiences shortness of breath. Denies fever, chills, sweats, chest tightness, wheezing or nasal congestion.     Allergies  Allergen Reactions   Codeine Other (See Comments)    Lump in throat   Tape Hives and Rash    Paper tape only   Latex Rash   Nickel Rash    Bumps Also other metals    Immunization History  Administered Date(s) Administered   Fluad Quad(high Dose 65+) 04/21/2019   Influenza, High Dose Seasonal PF 05/20/2018   Influenza-Unspecified 06/16/2019   PFIZER(Purple Top)SARS-COV-2 Vaccination 09/11/2019, 09/25/2019, 05/14/2020, 11/25/2020    Past Medical History:  Diagnosis Date   Ankylosing spondylitis (HCC)    Anxiety    Arthritis    RHEUMATOID   Back pain    Bronchitis    Constipation    [place]D [place]  [place]   CXR 01/11/18 showed mild COPD and chronic bronchitis   CTS (carpal tunnel syndrome)    Dry eye    Dry mouth    Dysrhythmia    "irregularity" unknown at this time - being evaluated by cardiology   Essential hypertension 11/24/2015   GERD (gastroesophageal reflux disease)    HLA B27 (HLA B27 positive)    Hypercholesteremia    Hyperlipidemia 11/24/2015   Hypertension    IBS (irritable bowel syndrome)    Joint pain    Neuropathy    OAB (overactive bladder)    Obesity    Osteoarthritis    Osteoporosis    Pre-diabetes    [place]heumatoid arthritis ([facility]   Sciatica    Seasonal allergies    Spinal stenosis     Tobacco History: Social History   Tobacco Use  Smoking Status Former   Packs/day: 1.00   Years: 35.00   Pack years: 35.00   Types: Cigarettes   Quit date: 05/29/1995   Years since quitting: 26.0  Smokeless Tobacco Never   Counseling given: Not Answered   Outpatient Medications Prior to Visit  Medication Sig Dispense Refill   albuterol (VENTOLIN HFA) 108 (90 Base) MCG/ACT inhaler Inhale 2 puffs into the lungs every 6 (six) hours as needed for wheezing or shortness of breath. 6.7 g 2   Biotin 1000 MCG tablet Take 1 tablet (1 mg total) by mouth daily. 30 tablet 0   Cholecalciferol (VITAMIN D3) 25 MCG (1000 UT) CAPS Take 1 capsule (1,000  Units total) by mouth daily. 30 capsule 0   Dextromethorphan-guaiFENesin 10-100 MG/5ML liquid Take 5 mLs by mouth every 12 (twelve) hours. 118 mL 1   famotidine (PEPCID) 20 MG tablet Take 20 mg by mouth daily.     folic acid (FOLVITE) 1 MG tablet Take 1 tablet (1 mg total) by mouth daily. 30 tablet 0   gabapentin (NEURONTIN) 300 MG capsule Take 300 mg by mouth at bedtime.   1   ID NOW COVID-19 KIT See admin instructions. for testing     inFLIXimab (REMICADE IV) Inject 100 mg into the vein every 6 (six) weeks.     inFLIXimab in sodium chloride 0.9 % Inject into the vein. Pt receives this infusion every 6 weeks      irbesartan (AVAPRO) 300 MG tablet Take 1 tablet (300 mg total) by mouth daily. 30 tablet 0   Magnesium 250 MG TABS Take 1 tablet (250 mg total) by mouth daily. 30 tablet 0   Methotrexate Sodium (METHOTREXATE, PF,) 50 MG/2ML injection INJECT 1 ML ONCE WEEKLY. (25 mg)  DISCARD VIAL AFTER USE     naproxen sodium (ALEVE) 220 MG tablet Take 220 mg by mouth 2 (two) times daily as needed (pain).     pantoprazole (PROTONIX) 40 MG tablet Take 1 tablet (40 mg total) by mouth daily. 30 tablet 0   Polyethyl Glycol-Propyl Glycol (SYSTANE OP) Systane (PF)  prn     Propylene Glycol (SYSTANE BALANCE) 0.6 % SOLN Place 1 drop into both eyes 2 (two) times daily as needed (dry eyes).     sertraline (ZOLOFT) 25 MG tablet Take 50 mg by mouth daily.      Tiotropium Bromide Monohydrate (SPIRIVA RESPIMAT) 2.5 MCG/ACT AERS Inhale 2 puffs into the lungs daily. 4 g 6   VITAMIN A PO Take 2,400 mcg by mouth daily.      vitamin B-12 (CYANOCOBALAMIN) 1000 MCG tablet Take 1 tablet (1,000 mcg total) by mouth daily. 30 tablet 0   atorvastatin (LIPITOR) 40 MG tablet Take 40 mg by mouth daily. (Patient not taking: No sig reported)     No facility-administered medications prior to visit.      Review of Systems  Review of Systems  Constitutional: Negative.   HENT:  Positive for congestion. Negative for postnasal drip.   Respiratory:  Positive for cough. Negative for chest tightness, shortness of breath and wheezing.     Physical Exam  BP 124/82 (BP Location: Right Arm, Patient Position: Sitting, Cuff Size: Normal)   Pulse 80   Temp 98.3 F (36.8 C) (Oral)   Ht 5' 2"  (1.575 m)   Wt 191 lb 12.8 oz (87 kg)   SpO2 99%   BMI 35.08 kg/m  Physical Exam Constitutional:      Appearance: Normal appearance.  HENT:     Head: Normocephalic and atraumatic.     Nose: Nose normal. No congestion or rhinorrhea.     Mouth/Throat:     Mouth: Mucous membranes are moist.     Pharynx: Oropharynx is clear.  Cardiovascular:      Rate and Rhythm: Normal rate and regular rhythm.  Pulmonary:     Effort: Pulmonary effort is normal.     Breath sounds: Normal breath sounds. No wheezing or rhonchi.     Comments: Possible rales at lung base Skin:    General: Skin is warm and dry.  Neurological:     General: No focal deficit present.     Mental Status: She is alert  and oriented to person, place, and time. Mental status is at baseline.  Psychiatric:        Mood and Affect: Mood normal.        Behavior: Behavior normal.        Thought Content: Thought content normal.        Judgment: Judgment normal.     Lab Results:  CBC    Component Value Date/Time   WBC 4.3 02/15/2021 2231   RBC 3.79 (L) 02/15/2021 2231   HGB 11.9 (L) 02/15/2021 2231   HGB 13.8 09/30/2019 1148   HCT 36.1 02/15/2021 2231   HCT 42.0 09/30/2019 1148   PLT 233 02/15/2021 2231   PLT 262 09/30/2019 1148   MCV 95.3 02/15/2021 2231   MCV 96 09/30/2019 1148   MCH 31.4 02/15/2021 2231   MCHC 33.0 02/15/2021 2231   RDW 15.2 02/15/2021 2231   RDW 12.8 09/30/2019 1148   LYMPHSABS 2.1 09/30/2019 1148   MONOABS 0.4 08/21/2018 1458   EOSABS 0.2 09/30/2019 1148   BASOSABS 0.0 09/30/2019 1148    BMET    Component Value Date/Time   NA 138 02/15/2021 2231   NA 140 09/30/2019 1148   K 3.6 02/15/2021 2231   CL 107 02/15/2021 2231   CO2 27 02/15/2021 2231   GLUCOSE 166 (H) 02/15/2021 2231   BUN 23 02/15/2021 2231   BUN 17 09/30/2019 1148   CREATININE 0.88 02/15/2021 2231   CALCIUM 9.3 02/15/2021 2231   GFRNONAA >60 02/15/2021 2231   GFRAA >60 02/01/2020 1115    BNP No results found for: BNP  ProBNP No results found for: PROBNP  Imaging: No results found.   Assessment & Plan:   Mucopurulent chronic bronchitis (HCC) - Cough x 2 months with purulent mucus. No associated shortness of breath/wheezing.  - Sending in antibiotic Zpack for acute bronchitis symptoms - Continue Spiriva respimat 2.40mg two puffs daily in the morning - Start  Tessalon perles every 8 hour as needed for cough; Dextromethorphan-guaifenesin 546mevery 4 hours (maximum dose: Guaifenesin 2,400 mg and dextromethorphan 120 mg per day  Orders: - CXR today re: bronchitis   Follow-up: - Due for regular follow-up with Dr. ElLoanne Drillingn December    ElMartyn EhrichNP 06/06/2021

## 2021-06-06 NOTE — Progress Notes (Signed)
 SABRA

## 2021-06-06 NOTE — Assessment & Plan Note (Addendum)
-   Cough x 2 months with purulent mucus. No associated shortness of breath/wheezing.  - Sending in antibiotic Zpack for acute bronchitis symptoms - Continue Spiriva respimat 2.83mcg two puffs daily in the morning - Start Tessalon perles every 8 hour as needed for cough; Dextromethorphan-guaifenesin 66ml every 4 hours (maximum dose: Guaifenesin 2,400 mg and dextromethorphan 120 mg per day  Orders: - CXR today re: bronchitis   Follow-up: - Due for regular follow-up with Dr. Everardo All in December

## 2021-06-06 NOTE — Patient Instructions (Addendum)
Acute on chronic Bronchitis: - Sending in antibiotic Zpack for acute symptoms - Continue Spiriva respimat two puffs daily in the morning - Start Tessalon perles every 8 hour as needed for cough - You can take Dextromethorphan-guaifenesin 16ml every 4 hours (maximum dose: Guaifenesin 2,400 mg and dextromethorphan 120 mg per day  Orders: - CXR today Re: bronchitis    Follow-up: - Due for regular follow-up with Dr. Everardo All in December  - As needed if symptoms do not improve or worsen

## 2021-06-07 ENCOUNTER — Other Ambulatory Visit: Payer: Self-pay

## 2021-06-07 ENCOUNTER — Ambulatory Visit: Payer: Medicare Other | Admitting: Physical Therapy

## 2021-06-07 ENCOUNTER — Encounter: Payer: Self-pay | Admitting: Physical Therapy

## 2021-06-07 DIAGNOSIS — G8929 Other chronic pain: Secondary | ICD-10-CM

## 2021-06-07 DIAGNOSIS — R293 Abnormal posture: Secondary | ICD-10-CM | POA: Diagnosis not present

## 2021-06-07 DIAGNOSIS — R279 Unspecified lack of coordination: Secondary | ICD-10-CM | POA: Diagnosis not present

## 2021-06-07 DIAGNOSIS — M5442 Lumbago with sciatica, left side: Secondary | ICD-10-CM | POA: Diagnosis not present

## 2021-06-07 DIAGNOSIS — M5441 Lumbago with sciatica, right side: Secondary | ICD-10-CM | POA: Diagnosis not present

## 2021-06-07 DIAGNOSIS — M6281 Muscle weakness (generalized): Secondary | ICD-10-CM

## 2021-06-07 NOTE — Therapy (Signed)
[facility] @ [place], [place], [address] Phone: [phone]   Fax:  [phone]  Physical Therapy Treatment  Patient Details  Name: Joy [name] MRN: [id number] Date of Birth: [birth year] Referring Provider (PT): Carlean Purl   Encounter Date: 06/07/2021   PT End of Session - 06/07/21 0941     Visit Number 7    Date for PT Re-Evaluation 06/13/21    Authorization Type medicare A/B    PT Start Time 0935    PT Stop Time 1013    PT Time Calculation (min) 38 min    Activity Tolerance Patient tolerated treatment well    Behavior During Therapy [facility] for tasks assessed/performed             Past Medical History:  Diagnosis Date   Ankylosing spondylitis ([place])    Anxiety    Arthritis    RHEUMATOID   Back pain    Bronchitis    Constipation    COPD (chronic obstructive pulmonary disease) ([place])    CXR 01/11/18 showed mild COPD and chronic bronchitis   CTS (carpal tunnel syndrome)    Dry eye    Dry mouth    Dysrhythmia    "irregularity" unknown at this time - being evaluated by cardiology   Essential hypertension 11/24/2015   GERD (gastroesophageal reflux disease)    HLA B27 (HLA B27 positive)    Hypercholesteremia    Hyperlipidemia 11/24/2015   Hypertension    IBS (irritable bowel syndrome)    Joint pain    Neuropathy    OAB (overactive bladder)    Obesity    Osteoarthritis    Osteoporosis    Pre-diabetes    Rheumatoid arthritis ([place]) 11/24/2015   Sciatica    Seasonal allergies    Spinal stenosis     Past Surgical History:  Procedure Laterality Date   ABDOMINAL HYSTERECTOMY  1995   BACK SURGERY     BREAST SURGERY     REDUCTION   CATARACT EXTRACTION W/PHACO  08/01/2012   Procedure: CATARACT EXTRACTION PHACO AND INTRAOCULAR LENS PLACEMENT ([place]);  Surgeon: Marylynn Pearson, MD;  Location: [facility];  Service: Ophthalmology;  Laterality: Right;   EYE SURGERY Bilateral    cataract   HERNIA REPAIR      RIGHT ING.   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   Procedure: TOTAL KNEE ARTHROPLASTY;  Surgeon: Gaynelle Arabian, MD;  Location: [facility];  Service: Orthopedics;  Laterality: Left;    There were no vitals filed for this visit.   Subjective Assessment - 06/07/21 0940     Subjective I have been doing better and feeling better.  BMs are regular and got injections in my butt and that helped my legs.    Pertinent History RA, 3 back surgeries, Bil TKA, chronic low back pain, hysterectomy    Patient Stated Goals be able have  good BM 5x/week                               [facility] Adult PT Treatment/Exercise - 06/07/21 0001       Neuro Re-ed    Neuro Re-ed Details  standing on foam pad during posture exercises and cues to use the exhale to engage core and pelvic floor      Lumbar Exercises: Aerobic   Nustep L2  x 10 min with core activated      Lumbar Exercises: Standing   Shoulder Extension Limitations standing and walking with isometric flexion; standing 2lb weight lift with shoulder flexion - 15x    Other Standing Lumbar Exercises row, ext- standing on foam mat - 20x    Other Standing Lumbar Exercises faom mat in tandem stance - lifting with exhale - 20x                       PT Short Term Goals - 03/27/21 1431       PT SHORT TERM GOAL #1   Title Independent with initial HEP     Baseline understands toileting techniques               PT Long Term Goals - 06/07/21 1016       PT LONG TERM GOAL #1   Title Patient to be independent with advanced HEP.    Status On-going      PT LONG TERM GOAL #2   Title Pt will report 50% less abominal soreness    Status Achieved      PT LONG TERM GOAL #3   Title Patient to report able to have 5 BMs per week    Status Achieved      PT LONG TERM GOAL #4   Title Patient to report tolerance of 45 min of standing/walking without onset of pain.     Status Partially Met      PT LONG TERM GOAL #5   Title decrease pain 25%    Status Achieved                   Plan - 06/07/21 1005     Clinical Impression Statement Pt has been doing much better with BMs and had less pain today.  She was able to tolerate exercises and progress difficulty with addition of balancing on the foam pad.  Pt is now only having some issues with urgency and will benefit from skilled PT to continue with progression of core and posture so that she can improve muscle activation of the pelvic floor as she is walking to the bathroom.    PT Treatment/Interventions ADLs/Self Care Home Management;Aquatic Therapy;Biofeedback;Cryotherapy;Electrical Stimulation;Moist Heat;Therapeutic activities;Therapeutic exercise;Neuromuscular re-education;Taping;Dry needling;Passive range of motion;Manual techniques;Patient/family education    PT Next Visit Plan core and posture strength and likely will finalize HEP    PT Home Exercise Plan .Access Code: [email or web address]    Consulted and Agree with Plan of Care Patient             Patient will benefit from skilled therapeutic intervention in order to improve the following deficits and impairments:  Pain, Postural dysfunction, Increased fascial restricitons, Decreased strength, Decreased coordination  Visit Diagnosis: Unspecified lack of coordination  Abnormal posture  Muscle weakness (generalized)  Chronic bilateral low back pain with bilateral sciatica     Problem List Patient Active Problem List   Diagnosis Date Noted   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   Prediabetes 02/01/2020   Obesity 02/01/2020   Spinal stenosis of lumbar region 03/04/2019   Chronic bronchitis ([place]) 09/25/2018   Mucopurulent chronic bronchitis ([place]) 08/21/2018   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   Acute blood loss as cause of postoperative anemia 06/21/2018   Polyneuropathy  06/21/2018   Spondylosis 06/13/2018   Status post lumbar spinal fusion 06/13/2018   Status post lumbar spine surgery for decompression of  spinal cord 06/13/2018   History of total knee replacement, 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   History of total knee arthroplasty, right 09/20/2017   OA (osteoarthritis) of knee 12/26/2015   Essential hypertension 11/24/2015   Hyperlipidemia 11/24/2015   Rheumatoid arthritis ([place]) 11/24/2015   Lumbar spondylosis 07/21/2014    Jule Ser, PT 06/07/2021, 10:17 AM  [facility] @ [place], [place], [address] Phone: [phone]   Fax:  [phone]  Name: MICHAILA KENNEY MRN: [id number] Date of Birth: 10/23/1940

## 2021-06-08 NOTE — Progress Notes (Signed)
Please let patient know CXR showed mild bronchitic changes. No change to current plan. She has already been placed on abx.

## 2021-06-12 DIAGNOSIS — M0589 Other rheumatoid arthritis with rheumatoid factor of multiple sites: Secondary | ICD-10-CM | POA: Diagnosis not present

## 2021-06-12 DIAGNOSIS — Z79899 Other long term (current) drug therapy: Secondary | ICD-10-CM | POA: Diagnosis not present

## 2021-06-13 ENCOUNTER — Encounter: Payer: Self-pay | Admitting: Physical Therapy

## 2021-06-13 ENCOUNTER — Ambulatory Visit: Payer: Medicare Other | Admitting: Physical Therapy

## 2021-06-13 ENCOUNTER — Other Ambulatory Visit: Payer: Self-pay

## 2021-06-13 DIAGNOSIS — M6281 Muscle weakness (generalized): Secondary | ICD-10-CM | POA: Diagnosis not present

## 2021-06-13 DIAGNOSIS — M5442 Lumbago with sciatica, left side: Secondary | ICD-10-CM

## 2021-06-13 DIAGNOSIS — R279 Unspecified lack of coordination: Secondary | ICD-10-CM | POA: Diagnosis not present

## 2021-06-13 DIAGNOSIS — R293 Abnormal posture: Secondary | ICD-10-CM

## 2021-06-13 DIAGNOSIS — G8929 Other chronic pain: Secondary | ICD-10-CM | POA: Diagnosis not present

## 2021-06-13 DIAGNOSIS — M5441 Lumbago with sciatica, right side: Secondary | ICD-10-CM | POA: Diagnosis not present

## 2021-06-13 NOTE — Therapy (Signed)
[facility] @ [place], [place], [address] Phone: [phone]   Fax:  [phone]  Physical Therapy Treatment  Patient Details  Name: Joy [name] MRN: [id number] Date of Birth: 1940-08-22 Referring Provider (PT): Carlean Purl   Encounter Date: 06/13/2021   PT End of Session - 06/13/21 1102     Visit Number 8    Date for PT Re-Evaluation 06/13/21    Authorization Type medicare A/B    PT Start Time 1102    PT Stop Time 1142    PT Time Calculation (min) 40 min    Activity Tolerance Patient tolerated treatment well    Behavior During Therapy [facility] for tasks assessed/performed             Past Medical History:  Diagnosis Date   Ankylosing spondylitis ([place])    Anxiety    Arthritis    RHEUMATOID   Back pain    Bronchitis    Constipation    COPD (chronic obstructive pulmonary disease) ([place])    CXR 01/11/18 showed mild COPD and chronic bronchitis   CTS (carpal tunnel syndrome)    Dry eye    Dry mouth    Dysrhythmia    "irregularity" unknown at this time - being evaluated by cardiology   Essential hypertension 11/24/2015   GERD (gastroesophageal reflux disease)    HLA B27 (HLA B27 positive)    Hypercholesteremia    Hyperlipidemia 11/24/2015   Hypertension    IBS (irritable bowel syndrome)    Joint pain    Neuropathy    OAB (overactive bladder)    Obesity    Osteoarthritis    Osteoporosis    Pre-diabetes    Rheumatoid arthritis ([place]) 11/24/2015   Sciatica    Seasonal allergies    Spinal stenosis     Past Surgical History:  Procedure Laterality Date   ABDOMINAL HYSTERECTOMY  1995   BACK SURGERY     BREAST SURGERY     REDUCTION   CATARACT EXTRACTION W/PHACO  08/01/2012   Procedure: CATARACT EXTRACTION PHACO AND INTRAOCULAR LENS PLACEMENT ([place]);  Surgeon: Marylynn Pearson, MD;  Location: [facility];  Service: Ophthalmology;  Laterality: Right;   EYE SURGERY Bilateral    cataract   HERNIA REPAIR     RIGHT  ING.   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   Procedure: TOTAL KNEE ARTHROPLASTY;  Surgeon: Gaynelle Arabian, MD;  [facility];  Service: Orthopedics;  Laterality: Left;    There were no vitals filed for this visit.   Subjective Assessment - 06/13/21 1155     Subjective I am doing well.  Back is still an issue and just very little leakage but the BMs are regular    Patient Stated Goals be able have  good BM 5x/week    Currently in Pain? No/denies                               [facility] Adult PT Treatment/Exercise - 06/13/21 0001       Lumbar Exercises: Stretches   Active Hamstring Stretch Right;Left;3 reps;30 seconds    Single Knee to Chest Stretch Right;Left;3 reps;30 seconds    Figure 4 Stretch 3 reps;30 seconds;With overpressure    Other Lumbar Stretch Exercise hip rotation 5 sec 10x      Lumbar Exercises: Standing  Other Standing Lumbar Exercises hip flexion and abduction with yellow loop - 20x    Other Standing Lumbar Exercises pelvic tilt at wall - 10x      Lumbar Exercises: Seated   Long Arc Quad on Chair Strengthening;Both;20 reps   with ball squeeze and kegel                      PT Short Term Goals - 03/27/21 1431       PT SHORT TERM GOAL #1   Title Independent with initial HEP     Baseline understands toileting techniques               PT Long Term Goals - 06/13/21 1142       PT LONG TERM GOAL #1   Title Patient to be independent with advanced HEP.    Status Achieved      PT LONG TERM GOAL #2   Title Pt will report 50% less abominal soreness    Status Achieved      PT LONG TERM GOAL #3   Title Patient to report able to have 5 BMs per week    Status Achieved      PT LONG TERM GOAL #4   Title Patient to report tolerance of 45 min of standing/walking without onset of pain.    Status Achieved      PT LONG TERM GOAL #5   Title decrease pain 25%     Status Achieved                   Plan - 06/13/21 1154     Clinical Impression Statement Pt has met all of her goals and is ind with HEP today.  Pt is recommended to d/c with HEP today.    PT Treatment/Interventions ADLs/Self Care Home Management;Aquatic Therapy;Biofeedback;Cryotherapy;Electrical Stimulation;Moist Heat;Therapeutic activities;Therapeutic exercise;Neuromuscular re-education;Taping;Dry needling;Passive range of motion;Manual techniques;Patient/family education    PT Next Visit Plan d/c today    PT Home Exercise Plan .Access Code: [email or web address]    Consulted and Agree with Plan of Care Patient             Patient will benefit from skilled therapeutic intervention in order to improve the following deficits and impairments:  Pain, Postural dysfunction, Increased fascial restricitons, Decreased strength, Decreased coordination  Visit Diagnosis: Unspecified lack of coordination  Abnormal posture  Muscle weakness (generalized)  Chronic bilateral low back pain with bilateral sciatica     Problem List Patient Active Problem List   Diagnosis Date Noted   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   Prediabetes 02/01/2020   Obesity 02/01/2020   Spinal stenosis of lumbar region 03/04/2019   Chronic bronchitis ([place]) 09/25/2018   Mucopurulent chronic bronchitis ([place]) 08/21/2018   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   Acute blood loss as cause of postoperative anemia 06/21/2018   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   Spondylosis 06/13/2018   Status post lumbar spinal fusion 06/13/2018   Status post lumbar spine surgery for decompression of spinal cord 06/13/2018   History of total knee replacement, 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 [place], [place], [address] Phone: [phone]  Fax:  [phone]  Name: Joy [name] MRN: [id number] Date of Birth: [birth year]  PHYSICAL THERAPY DISCHARGE SUMMARY  Visits from Start of Care: 8  Current functional level related to goals / functional outcomes: See above goals met   Remaining deficits: See above details   Education / Equipment: HEP  Patient agrees to discharge. Patient goals were met. Patient is being discharged due to meeting the stated rehab goals.  Gustavus Bryant, PT 06/13/21 11:59 AM

## 2021-06-27 DIAGNOSIS — Z6836 Body mass index (BMI) 36.0-36.9, adult: Secondary | ICD-10-CM | POA: Diagnosis not present

## 2021-06-27 DIAGNOSIS — M48062 Spinal stenosis, lumbar region with neurogenic claudication: Secondary | ICD-10-CM | POA: Diagnosis not present

## 2021-07-06 ENCOUNTER — Encounter: Payer: Self-pay | Admitting: *Deleted

## 2021-07-06 ENCOUNTER — Telehealth: Payer: Self-pay | Admitting: Primary Care

## 2021-07-06 NOTE — Progress Notes (Signed)
LMTCB and will mail letter

## 2021-07-06 NOTE — Telephone Encounter (Signed)
Please let patient know CXR showed mild bronchitic changes. No change to current plan. She has already been placed on abx.   I have called and LM on VM

## 2021-07-06 NOTE — Telephone Encounter (Signed)
Spoke with pt and notified of results per Dr. Beth Pt verbalized understanding and denied any questions. 

## 2021-07-07 ENCOUNTER — Encounter: Payer: Self-pay | Admitting: Primary Care

## 2021-07-07 ENCOUNTER — Other Ambulatory Visit: Payer: Self-pay

## 2021-07-07 ENCOUNTER — Ambulatory Visit (INDEPENDENT_AMBULATORY_CARE_PROVIDER_SITE_OTHER): Payer: Medicare Other | Admitting: Primary Care

## 2021-07-07 VITALS — BP 124/82 | HR 77 | Temp 97.8°F | Ht 62.0 in | Wt 190.8 lb

## 2021-07-07 DIAGNOSIS — J42 Unspecified chronic bronchitis: Secondary | ICD-10-CM

## 2021-07-07 DIAGNOSIS — R053 Chronic cough: Secondary | ICD-10-CM | POA: Diagnosis not present

## 2021-07-07 MED ORDER — PREDNISONE 10 MG PO TABS: ORAL_TABLET | ORAL | 0 refills | Status: DC

## 2021-07-07 NOTE — Assessment & Plan Note (Addendum)
-   Patient has had a cough for 3 months with purulent mucus. No improvement with oral azithromycin course. She has hx RA on methotrexate and Remicade. Diffusion capacity on PFTs in 2020 was mildly decreased. No CT chest imaging in chart. We will get HRCT to ensure patient does not have underlying ILD along with sputum cultures. Recommend she continue Spiriva respimat two puffs once daily, Dextromethorphan-guaifenesin prn cough, add flutter valve and sending Rx for prednisone taper.

## 2021-07-07 NOTE — Progress Notes (Signed)
_0  ID: Joy [name], female    DOB: 1940-11-09, 80 y.o.   MRN: [id number]  Chief Complaint  Patient presents with   Follow-up    Patient is coughing a lot. She says when she coughs she brings up greenish brownish mucus.     Referring provider: Marda Stalker, PA-C  HPI: 80 year old female, former smoker quit 1996 (35-pack-year history).  Past medical history significant for chronic bronchitis, rheumatoid arthritis (on methotrexate and infliximab), hypertension, hyperlipidemia, obesity. Patient of Dr. Loanne Drilling, last seen on 06/06/21 for acute OV d/t cough.   Previous [facility] encounter: 05/01/21 In the last two weeks she has had worsening productive cough with sputum that is brown. Occasional wheezing. Allergies and changes in weather worsens her symptoms. She uses her albuterol twice a day. Previously on Spiriva which she had benefit in the past. Has fatigued.  Social History: Quit smoking in 1995. Smoked for 30 years x 1ppd.   06/06/2021 Patient presents today for acute office visit with reports of cough. She has seen no improvement in her cough since starting Spiriva Respimat 2.31mg or taking prednisone 463mx 5 days. Cough remains productive with dark green mucus. Rarely experiences shortness of breath. Denies fever, chills, sweats, chest tightness, wheezing or nasal congestion.   07/07/2021  Interim hx  Patient presents today for acute OV d/t cough. She was last seen 1 month ago for similar complaints. CXR on 10/18 showed hyperinflated lungs with mild chronic peribronchial thickening. She was treated with course of Azithromycin and given tessalon perles for cough. Neither were particularly helpful. She still has a congested cough with purulent-brown mucus. She does not have any significant shortness of breath symptoms. She has very rare wheezing. She is complaint with Spiriva respimat two puffs once daily and uses albuterol <1/week. She does not consistently use mucinex  and any otc nasal sprays. She has hx RA, on methotrexate and remicade IV q 6 weeks. No recent sputum cultures on file or CT chest imaging.   Allergies  Allergen Reactions   Codeine Other (See Comments)    Lump in throat   Tape Hives and Rash    Paper tape only   Latex Rash   Nickel Rash    Bumps Also other metals    Immunization History  Administered Date(s) Administered   Fluad Quad(high Dose 65+) 04/21/2019   Influenza, High Dose Seasonal PF 05/20/2018   Influenza-Unspecified 06/16/2019   PFIZER(Purple Top)SARS-COV-2 Vaccination 09/11/2019, 09/25/2019, 05/14/2020, 11/25/2020    Past Medical History:  Diagnosis Date   Ankylosing spondylitis (HCC)    Anxiety    Arthritis    RHEUMATOID   Back pain    Bronchitis    Constipation    COPD (chronic obstructive pulmonary disease) ([place]   CXR 01/11/18 showed mild COPD and chronic bronchitis   CTS (carpal tunnel syndrome)    Dry eye    Dry mouth    Dysrhythmia    "irregularity" unknown at this time - being evaluated by cardiology   Essential hypertension 11/24/2015   GERD (gastroesophageal reflux disease)    HLA B27 (HLA B27 positive)    Hypercholesteremia    Hyperlipidemia 11/24/2015   Hypertension    IBS (irritable bowel syndrome)    Joint pain    Neuropathy    OAB (overactive bladder)    Obesity    Osteoarthritis    Osteoporosis    Pre-diabetes    [place]oid arthritis ([place]/01[place]   Sciatica    Seasonal allergies  Spinal stenosis     Tobacco History: Social History   Tobacco Use  Smoking Status Former   Packs/day: 1.00   Years: 35.00   Pack years: 35.00   Types: Cigarettes   Quit date: 05/29/1995   Years since quitting: 26.1  Smokeless Tobacco Never   Counseling given: Not Answered   Outpatient Medications Prior to Visit  Medication Sig Dispense Refill   albuterol (VENTOLIN HFA) 108 (90 Base) MCG/ACT inhaler Inhale 2 puffs into the lungs every 6 (six) hours as needed for wheezing or shortness of  breath. 6.7 g 2   benzonatate (TESSALON) 200 MG capsule Take 1 capsule (200 mg total) by mouth 3 (three) times daily as needed for cough. 30 capsule 1   Biotin 1000 MCG tablet Take 1 tablet (1 mg total) by mouth daily. 30 tablet 0   Cholecalciferol (VITAMIN D3) 25 MCG (1000 UT) CAPS Take 1 capsule (1,000 Units total) by mouth daily. 30 capsule 0   Dextromethorphan-guaiFENesin 10-100 MG/5ML liquid Take 5 mLs by mouth every 12 (twelve) hours. 118 mL 1   famotidine (PEPCID) 20 MG tablet Take 20 mg by mouth daily.     folic acid (FOLVITE) 1 MG tablet Take 1 tablet (1 mg total) by mouth daily. 30 tablet 0   gabapentin (NEURONTIN) 300 MG capsule Take 300 mg by mouth at bedtime.   1   ID NOW COVID-19 KIT See admin instructions. for testing     inFLIXimab (REMICADE IV) Inject 100 mg into the vein every 6 (six) weeks.     inFLIXimab in sodium chloride 0.9 % Inject into the vein. Pt receives this infusion every 6 weeks     irbesartan (AVAPRO) 300 MG tablet Take 1 tablet (300 mg total) by mouth daily. 30 tablet 0   Magnesium 250 MG TABS Take 1 tablet (250 mg total) by mouth daily. 30 tablet 0   Methotrexate Sodium (METHOTREXATE, PF,) 50 MG/2ML injection INJECT 1 ML ONCE WEEKLY. (25 mg)  DISCARD VIAL AFTER USE     naproxen sodium (ALEVE) 220 MG tablet Take 220 mg by mouth 2 (two) times daily as needed (pain).     pantoprazole (PROTONIX) 40 MG tablet Take 1 tablet (40 mg total) by mouth daily. 30 tablet 0   Polyethyl Glycol-Propyl Glycol (SYSTANE OP) Systane (PF)  prn     Propylene Glycol (SYSTANE BALANCE) 0.6 % SOLN Place 1 drop into both eyes 2 (two) times daily as needed (dry eyes).     sertraline (ZOLOFT) 25 MG tablet Take 50 mg by mouth daily.      Tiotropium Bromide Monohydrate (SPIRIVA RESPIMAT) 2.5 MCG/ACT AERS Inhale 2 puffs into the lungs daily. 4 g 6   VITAMIN A PO Take 2,400 mcg by mouth daily.      vitamin B-12 (CYANOCOBALAMIN) 1000 MCG tablet Take 1 tablet (1,000 mcg total) by mouth daily. 30  tablet 0   atorvastatin (LIPITOR) 40 MG tablet Take 40 mg by mouth daily. (Patient not taking: Reported on 06/06/2021)     azithromycin (ZITHROMAX) 250 MG tablet Zpack taper as directed (Patient not taking: Reported on 07/07/2021) 6 tablet 0   No facility-administered medications prior to visit.   Review of Systems  Review of Systems  Constitutional: Negative.   HENT:  Positive for congestion.   Respiratory:  Positive for cough and wheezing. Negative for chest tightness and shortness of breath.   Cardiovascular: Negative.     Physical Exam  BP 124/82 (BP Location: Left Arm, Patient  Position: Sitting, Cuff Size: Normal)   Pulse 77   Temp 97.8 F (36.6 C) (Oral)   Ht _0  (1.575 m)   Wt 190 lb 12.8 oz (86.5 kg)   SpO2 95%   BMI 34.90 kg/m  Physical Exam Constitutional:      Appearance: Normal appearance.  HENT:     Head: Normocephalic and atraumatic.     Mouth/Throat:     Comments: Deferred d/t masking Cardiovascular:     Rate and Rhythm: Normal rate and regular rhythm.  Pulmonary:     Effort: No respiratory distress.     Breath sounds: No wheezing or rhonchi.     Comments: Fine rales left base, otherwise clear Musculoskeletal:        General: Normal range of motion.  Skin:    General: Skin is warm and dry.  Neurological:     General: No focal deficit present.     Mental Status: She is alert and oriented to person, place, and time. Mental status is at baseline.  Psychiatric:        Mood and Affect: Mood normal.        Behavior: Behavior normal.        Thought Content: Thought content normal.        Judgment: Judgment normal.     Lab Results:  CBC    Component Value Date/Time   WBC 4.3 02/15/2021 2231   RBC 3.79 (L) 02/15/2021 2231   HGB 11.9 (L) 02/15/2021 2231   HGB 13.8 09/30/2019 1148   HCT 36.1 02/15/2021 2231   HCT 42.0 09/30/2019 1148   PLT 233 02/15/2021 2231   PLT 262 09/30/2019 1148   MCV 95.3 02/15/2021 2231   MCV 96 09/30/2019 1148   MCH  31.4 02/15/2021 2231   MCHC 33.0 02/15/2021 2231   RDW 15.2 02/15/2021 2231   RDW 12.8 09/30/2019 1148   LYMPHSABS 2.1 09/30/2019 1148   MONOABS 0.4 08/21/2018 1458   EOSABS 0.2 09/30/2019 1148   BASOSABS 0.0 09/30/2019 1148    BMET    Component Value Date/Time   NA 138 02/15/2021 2231   NA 140 09/30/2019 1148   K 3.6 02/15/2021 2231   CL 107 02/15/2021 2231   CO2 27 02/15/2021 2231   GLUCOSE 166 (H) 02/15/2021 2231   BUN 23 02/15/2021 2231   BUN 17 09/30/2019 1148   CREATININE 0.88 02/15/2021 2231   CALCIUM 9.3 02/15/2021 2231   GFRNONAA >60 02/15/2021 2231   GFRAA >60 02/01/2020 1115    BNP No results found for: BNP  ProBNP No results found for: PROBNP  Imaging: No results found.   Assessment & Plan:   Chronic bronchitis ([place]) - Patient has had a cough for 3 months with purulent mucus. No improvement with oral azithromycin course. She has hx RA on methotrexate and Remicade. Diffusion capacity on PFTs in 2020 was mildly decreased. No CT chest imaging in chart. We will get HRCT to ensure patient does not have underlying ILD along with sputum cultures. Recommend she continue Spiriva respimat two puffs once daily, Dextromethorphan-guaifenesin prn cough, add flutter valve and sending Rx for prednisone taper.    Martyn Ehrich, NP 07/07/2021

## 2021-07-07 NOTE — Patient Instructions (Addendum)
Recommendations:  - Continue Spiriva respimat 2 puffs daily in the morning - Take Dextromethorphan-guaifenesin (mucinex DM)  twice a day to loosen phlegm  - Take Flonase nasal spray daily  - Use flutter valve three times a day - Stop tesslone perles if they have not been helpful   Orders: - High resolution chest CT re: chronic cough (ordered) - Sputum culture (ordered) - Flutter valve   Follow-up: - First available with Dr. Everardo All

## 2021-07-10 ENCOUNTER — Other Ambulatory Visit: Payer: Medicare Other

## 2021-07-10 DIAGNOSIS — J42 Unspecified chronic bronchitis: Secondary | ICD-10-CM | POA: Diagnosis not present

## 2021-07-10 DIAGNOSIS — R053 Chronic cough: Secondary | ICD-10-CM

## 2021-07-11 ENCOUNTER — Telehealth (HOSPITAL_BASED_OUTPATIENT_CLINIC_OR_DEPARTMENT_OTHER): Payer: Self-pay

## 2021-07-14 LAB — RESPIRATORY CULTURE OR RESPIRATORY AND SPUTUM CULTURE
MICRO NUMBER:: 12664153
SPECIMEN QUALITY:: ADEQUATE

## 2021-07-17 ENCOUNTER — Telehealth: Payer: Self-pay | Admitting: Pulmonary Disease

## 2021-07-17 NOTE — Telephone Encounter (Signed)
Lm x1 for patient.  

## 2021-07-17 NOTE — Telephone Encounter (Signed)
Please let patient know CXR showed mild bronchitic changes. No change to current plan. She has already been placed on abx  Pt notified of results

## 2021-07-18 ENCOUNTER — Telehealth (HOSPITAL_BASED_OUTPATIENT_CLINIC_OR_DEPARTMENT_OTHER): Payer: Self-pay

## 2021-07-20 ENCOUNTER — Other Ambulatory Visit: Payer: Self-pay

## 2021-07-20 ENCOUNTER — Ambulatory Visit (INDEPENDENT_AMBULATORY_CARE_PROVIDER_SITE_OTHER): Payer: Medicare Other | Admitting: Pulmonary Disease

## 2021-07-20 ENCOUNTER — Encounter: Payer: Self-pay | Admitting: Pulmonary Disease

## 2021-07-20 VITALS — BP 130/80 | HR 73 | Temp 97.8°F | Ht 62.0 in | Wt 191.4 lb

## 2021-07-20 DIAGNOSIS — J151 Pneumonia due to Pseudomonas: Secondary | ICD-10-CM | POA: Diagnosis not present

## 2021-07-20 DIAGNOSIS — J42 Unspecified chronic bronchitis: Secondary | ICD-10-CM

## 2021-07-20 MED ORDER — CIPROFLOXACIN HCL 500 MG PO TABS: 500.0000 mg | ORAL_TABLET | Freq: Two times a day (BID) | ORAL | 0 refills | Status: AC

## 2021-07-20 NOTE — Progress Notes (Signed)
 Synopsis: Referred in 05/2018 for hx of chronic bronchitis x 2 years.   Subjective:   PATIENT ID: Joy [name] GENDER: female DOB: 08/22/1940, MRN: [id number]   HPI  Chief Complaint  Patient presents with   Follow-up    Cough has not gotten better   [name] is an 80 year old female remote smoker with RA on methotrexate and infliximab who presents for follow-up  Synopsis: 2019 - Established [facility] for longstanding history of bronchitis. Started on Spiriva 2020 - Improved symptoms on Spiriva however discontinued due to cost.  2022 - Restarted on Spiriva. August - Augmentin. September - steroids. November +pseudomonas   05/01/21 In the last two weeks she has had worsening productive cough with sputum that is brown. Occasional wheezing. Allergies and changes in weather worsens her symptoms. She uses her albuterol twice a day. Previously on Spiriva which she had benefit in the past. Has fatigued.  07/20/21 Since our last visit she has seen NP Walsh for acute visit for persistent cough with dark sputum despite starting Spiriva or steroid. She was treated with azithromycin and tessalon perles without improvement as awell.CXR had no acute changes. She reports worsening cough. Sputum culture on 07/10/21 + pseudomonas. She is scheduled for CT on 07/28/21  Social History: Quit smoking in 1995. Smoked for 30 years x 1ppd.   Past Medical History:  Diagnosis Date   Ankylosing spondylitis (HCC)    Anxiety    Arthritis    RHEUMATOID   Back pain    Bronchitis    Constipation    COPD (chronic obstructive pulmonary disease) (HCC)    CXR 01/11/18 showed mild COPD and chronic bronchitis   CTS (carpal tunnel syndrome)    Dry eye    Dry mouth    Dysrhythmia    "irregularity" unknown at this time - being evaluated by cardiology   Essential hypertension 11/24/2015   GERD (gastroesophageal reflux disease)    HLA B27 (HLA B27 positive)    Hypercholesteremia     Hyperlipidemia 11/24/2015   Hypertension    IBS (irritable bowel syndrome)    Joint pain    Neuropathy    OAB (overactive bladder)    Obesity    Osteoarthritis    Osteoporosis    Pre-diabetes    Rheumatoid arthritis (HCC) 11/24/2015   Sciatica    Seasonal allergies    Spinal stenosis     Allergies  Allergen Reactions   Codeine Other (See Comments)    Lump in throat   Tape Hives and Rash    Paper tape only   Latex Rash   Nickel Rash    Bumps Also other metals     Outpatient Medications Prior to Visit  Medication Sig Dispense Refill   albuterol (VENTOLIN HFA) 108 (90 Base) MCG/ACT inhaler Inhale 2 puffs into the lungs every 6 (six) hours as needed for wheezing or shortness of breath. 6.7 g 2   azithromycin (ZITHROMAX) 250 MG tablet Zpack taper as directed 6 tablet 0   benzonatate (TESSALON) 200 MG capsule Take 1 capsule (200 mg total) by mouth 3 (three) times daily as needed for cough. 30 capsule 1   Biotin 1000 MCG tablet Take 1 tablet (1 mg total) by mouth daily. 30 tablet 0   Cholecalciferol (VITAMIN D3) 25 MCG (1000 UT) CAPS Take 1 capsule (1,000 Units total) by mouth daily. 30 capsule 0   famotidine (PEPCID) 20 MG tablet Take 20 mg by mouth daily.       folic acid (FOLVITE) 1 MG tablet Take 1 tablet (1 mg total) by mouth daily. 30 tablet 0   ID NOW COVID-19 KIT See admin instructions. for testing     inFLIXimab (REMICADE IV) Inject 100 mg into the vein every 6 (six) weeks.     inFLIXimab in sodium chloride 0.9 % Inject into the vein. Pt receives this infusion every 6 weeks     irbesartan (AVAPRO) 300 MG tablet Take 1 tablet (300 mg total) by mouth daily. 30 tablet 0   Magnesium 250 MG TABS Take 1 tablet (250 mg total) by mouth daily. 30 tablet 0   Methotrexate Sodium (METHOTREXATE, PF,) 50 MG/2ML injection INJECT 1 ML ONCE WEEKLY. (25 mg)  DISCARD VIAL AFTER USE     naproxen sodium (ALEVE) 220 MG tablet Take 220 mg by mouth 2 (two) times daily as needed (pain).      pantoprazole (PROTONIX) 40 MG tablet Take 1 tablet (40 mg total) by mouth daily. 30 tablet 0   Polyethyl Glycol-Propyl Glycol (SYSTANE OP) Systane (PF)  prn     predniSONE (DELTASONE) 10 MG tablet Take 4 tabs po daily x 2 days; then 3 tabs for 2 days; then 2 tabs for 2 days; then 1 tab for 2 days 20 tablet 0   Propylene Glycol (SYSTANE BALANCE) 0.6 % SOLN Place 1 drop into both eyes 2 (two) times daily as needed (dry eyes).     sertraline (ZOLOFT) 25 MG tablet Take 50 mg by mouth daily.      Tiotropium Bromide Monohydrate (SPIRIVA RESPIMAT) 2.5 MCG/ACT AERS Inhale 2 puffs into the lungs daily. 4 g 6   VITAMIN A PO Take 2,400 mcg by mouth daily.      vitamin B-12 (CYANOCOBALAMIN) 1000 MCG tablet Take 1 tablet (1,000 mcg total) by mouth daily. 30 tablet 0   atorvastatin (LIPITOR) 40 MG tablet Take 40 mg by mouth daily. (Patient not taking: Reported on 06/06/2021)     Dextromethorphan-guaiFENesin 10-100 MG/5ML liquid Take 5 mLs by mouth every 12 (twelve) hours. (Patient not taking: Reported on 07/20/2021) 118 mL 1   gabapentin (NEURONTIN) 300 MG capsule Take 300 mg by mouth at bedtime.  (Patient not taking: Reported on 07/20/2021)  1   No facility-administered medications prior to visit.    Review of Systems  Constitutional:  Negative for chills, diaphoresis, fever, malaise/fatigue and weight loss.  HENT:  Positive for congestion.   Respiratory:  Positive for cough, sputum production and shortness of breath. Negative for hemoptysis and wheezing.   Cardiovascular:  Negative for chest pain, palpitations and leg swelling.   Objective:   Vitals:   07/20/21 1045  BP: 130/80  Pulse: 73  Temp: 97.8 F (36.6 C)  TempSrc: Oral  SpO2: 98%  Weight: 191 lb 6.4 oz (86.8 kg)  Height: 5' 2" (1.575 m)   Physical Exam: General: Well-appearing, no acute distress HENT: [place], AT Eyes: EOMI, no scleral icterus Respiratory: Clear to auscultation bilaterally.  No crackles, wheezing or rales Cardiovascular:  RRR, -M/R/G, no JVD Extremities:-Edema,-tenderness Neuro: AAO x4, CNII-XII grossly intact Psych: Normal mood, normal affect  Chest imaging: CXR 01/11/18 - No pulmonary edema, effusion or infiltrate  PFT:  08/21/18 -  FVC 2.4 (154%) FEV1 2.21 (156%) Ratio 74 TLC 104% DLCO corrected 78% Interpretation: Normal spirometry and lung volumes with mildly reduced DLCO  Mild obstructive defect present with mildly reduced DLCO. TLC normal. No significant bronchodilator effect present however does not preclude benefit of bronchodilator therapy.  Assessment & Plan:   80 year old female with RA on methotrexate who presents for follow-up. Respiratory culture 11/21 + Pseudomonas aeruginosa, pan -sensitive except for Zosyn - intermediate. Discussed management of infection. If symptoms persistent, would extend to 3 week course.  Pseudomonas Pneumonia --START ciprofloxacin 500 mg TWICE a day x 14 days  Chronic bronchitis --CONTINUE Spiriva 2.5 mcg TWO puffs ONCE a day --CONTINUE Albuterol every 4 hours as needed for shortness of breath or wheezing  Immunization History  Administered Date(s) Administered   Fluad Quad(high Dose 65+) 04/21/2019, 05/20/2021   Influenza, High Dose Seasonal PF 05/20/2018   Influenza-Unspecified 06/16/2019   PFIZER(Purple Top)SARS-COV-2 Vaccination 09/11/2019, 09/25/2019, 05/14/2020, 11/25/2020   Meds ordered this encounter  Medications   ciprofloxacin (CIPRO) 500 MG tablet    Sig: Take 1 tablet (500 mg total) by mouth 2 (two) times daily for 14 days.    Dispense:  28 tablet    Refill:  0   No orders of the defined types were placed in this encounter.  Return in about 8 days (around 07/28/2021).  I have spent a total time of 36-minutes on the day of the appointment reviewing prior documentation, coordinating care and discussing medical diagnosis and plan with the patient/family. Past medical history, allergies, medications were reviewed. Pertinent imaging, labs and  tests included in this note have been reviewed and interpreted independently by me.  Krista Som Rodman Pickle, MD [facility] 07/20/2021 10:48 AM

## 2021-07-20 NOTE — Patient Instructions (Addendum)
Pseudomonas Pneumonia --START ciprofloxacin 500 mg TWICE a day  Chronic bronchitis --CONTINUE Spiriva 2.5 mcg TWO puffs ONCE a day --CONTINUE Albuterol every 4 hours as needed for shortness of breath or wheezing  Follow-up with me on 07/28/21 at 1:45 PM to discuss CT scan

## 2021-07-21 ENCOUNTER — Telehealth: Payer: Self-pay | Admitting: Primary Care

## 2021-07-21 NOTE — Telephone Encounter (Signed)
Error

## 2021-07-21 NOTE — Progress Notes (Signed)
I see you sent in Cipro, thank you I was just sending in a fluoroquinolone. Appreciate it. She is seeing you on 12/9

## 2021-07-23 ENCOUNTER — Encounter: Payer: Self-pay | Admitting: Pulmonary Disease

## 2021-07-23 DIAGNOSIS — J151 Pneumonia due to Pseudomonas: Secondary | ICD-10-CM | POA: Insufficient documentation

## 2021-07-24 ENCOUNTER — Ambulatory Visit: Payer: Medicare Other | Admitting: Pulmonary Disease

## 2021-07-24 DIAGNOSIS — M0589 Other rheumatoid arthritis with rheumatoid factor of multiple sites: Secondary | ICD-10-CM | POA: Diagnosis not present

## 2021-07-24 NOTE — Progress Notes (Signed)
Thank you :)

## 2021-07-28 ENCOUNTER — Ambulatory Visit (INDEPENDENT_AMBULATORY_CARE_PROVIDER_SITE_OTHER): Payer: Medicare Other | Admitting: Pulmonary Disease

## 2021-07-28 ENCOUNTER — Encounter: Payer: Self-pay | Admitting: Pulmonary Disease

## 2021-07-28 ENCOUNTER — Other Ambulatory Visit: Payer: Self-pay

## 2021-07-28 ENCOUNTER — Ambulatory Visit (HOSPITAL_BASED_OUTPATIENT_CLINIC_OR_DEPARTMENT_OTHER)
Admission: RE | Admit: 2021-07-28 | Discharge: 2021-07-28 | Disposition: A | Discharge status: Home / Self Care | Payer: Medicare Other | Source: Ambulatory Visit | Attending: Primary Care | Admitting: Primary Care

## 2021-07-28 VITALS — BP 126/82 | HR 57 | Temp 97.8°F | Ht 62.0 in | Wt 191.0 lb

## 2021-07-28 DIAGNOSIS — J9819 Other pulmonary collapse: Secondary | ICD-10-CM | POA: Diagnosis not present

## 2021-07-28 DIAGNOSIS — J151 Pneumonia due to Pseudomonas: Secondary | ICD-10-CM | POA: Diagnosis not present

## 2021-07-28 DIAGNOSIS — I700 Atherosclerosis of aorta: Secondary | ICD-10-CM | POA: Diagnosis not present

## 2021-07-28 DIAGNOSIS — I2510 Atherosclerotic heart disease of native coronary artery without angina pectoris: Secondary | ICD-10-CM | POA: Diagnosis not present

## 2021-07-28 DIAGNOSIS — R053 Chronic cough: Secondary | ICD-10-CM | POA: Insufficient documentation

## 2021-07-28 DIAGNOSIS — K449 Diaphragmatic hernia without obstruction or gangrene: Secondary | ICD-10-CM | POA: Diagnosis not present

## 2021-07-28 MED ORDER — SPIRIVA RESPIMAT 2.5 MCG/ACT IN AERS: 2.0000 | INHALATION_SPRAY | Freq: Every day | RESPIRATORY_TRACT | 5 refills | Status: DC

## 2021-07-28 MED ORDER — SPIRIVA RESPIMAT 2.5 MCG/ACT IN AERS: 2.0000 | INHALATION_SPRAY | Freq: Every day | RESPIRATORY_TRACT | 0 refills | Status: DC

## 2021-07-28 MED ORDER — CIPROFLOXACIN HCL 500 MG PO TABS: 500.0000 mg | ORAL_TABLET | Freq: Two times a day (BID) | ORAL | 0 refills | Status: AC

## 2021-07-28 NOTE — Patient Instructions (Addendum)
Pseudomonas Pneumonia - improved but persistent symptoms --CONTINUE additional 7 days of ciprofloxacin 500 mg TWICE a day for total 21 day treatment  Chronic bronchitis --CONTINUE Spiriva 2.5 mcg TWO puffs ONCE a day --CONTINUE Albuterol every 4 hours as needed for shortness of breath or wheezing  Schedule follow-up with me in Feb 2023

## 2021-07-28 NOTE — Progress Notes (Signed)
Synopsis: Referred in 05/2018 for hx of chronic bronchitis x 2 years.   Subjective:   PATIENT ID: Joy [name] GENDER: female DOB: 14-Oct-1940, MRN: [id number]   HPI  Chief Complaint  Patient presents with   Follow-up    Patient is here for results   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 Started on Spiriva 2020 - Improved symptoms on Spiriva however discontinued due to cost.  2022 - Restarted on Spiriva. August - Augmentin. September - steroids. November +pseudomonas   05/01/21 In the last two weeks she has had worsening productive cough with sputum that is brown. Occasional wheezing. Allergies and changes in weather worsens her symptoms. She uses her albuterol twice a day. Previously on Spiriva which she had benefit in the past. Has fatigued.  07/20/21 Since our last visit she has seen NP Volanda Napoleon for acute visit for persistent cough with dark sputum despite starting Spiriva or steroid. She was treated with azithromycin and tessalon perles without improvement as awell.CXR had no acute changes. She reports worsening cough. Sputum culture on 07/10/21 + pseudomonas. She is scheduled for CT on 07/28/21  07/28/21 She is overall 50% improved however continues to have persistent deep cough. Sputum production has improved  Social History: Quit smoking in 1995. Smoked for 30 years x 1ppd.   Past Medical History:  Diagnosis Date   Ankylosing spondylitis (HCC)    Anxiety    Arthritis    RHEUMATOID   Back pain    Bronchitis    Constipation    COPD (chronic obstructive pulmonary disease) ([place])    CXR 01/11/18 showed mild COPD and chronic bronchitis   CTS (carpal tunnel syndrome)    Dry eye    Dry mouth    Dysrhythmia    "irregularity" unknown at this time - being evaluated by cardiology   Essential hypertension  11/24/2015   GERD (gastroesophageal reflux disease)    HLA B27 (HLA B27 positive)    Hypercholesteremia    Hyperlipidemia 11/24/2015   Hypertension    IBS (irritable bowel syndrome)    Joint pain    Neuropathy    OAB (overactive bladder)    Obesity    Osteoarthritis    Osteoporosis    Pre-diabetes    Rheumatoid arthritis ([place]) 11/24/2015   Sciatica    Seasonal allergies    Spinal stenosis     Allergies  Allergen Reactions   Codeine Other (See Comments)    Lump in throat   Tape Hives and Rash    Paper tape only   Latex Rash   Nickel Rash    Bumps Also other metals     Outpatient Medications Prior to Visit  Medication Sig Dispense Refill   albuterol (VENTOLIN HFA) 108 (90 Base) MCG/ACT inhaler Inhale 2 puffs into the lungs every 6 (six) hours as needed for wheezing or shortness of breath. 6.7 g 2   azithromycin (ZITHROMAX) 250 MG tablet Zpack taper as directed 6 tablet 0   benzonatate (TESSALON) 200 MG capsule Take 1 capsule (200 mg total) by mouth 3 (three) times daily as needed for cough. 30 capsule 1   Biotin 1000 MCG tablet Take 1 tablet (1 mg total) by mouth daily. 30 tablet 0   Cholecalciferol (VITAMIN D3) 25 MCG (1000 UT) CAPS Take 1 capsule (1,000 Units total) by mouth daily. [place]  capsule 0   ciprofloxacin (CIPRO) 500 MG tablet Take 1 tablet (500 mg total) by mouth 2 (two) times daily for 14 days. 28 tablet 0   famotidine (PEPCID) 20 MG tablet Take 20 mg by mouth daily.     folic acid (FOLVITE) 1 MG tablet Take 1 tablet (1 mg total) by mouth daily. 30 tablet 0   gabapentin (NEURONTIN) 300 MG capsule Take 300 mg by mouth at bedtime.  1   ID NOW COVID-19 KIT See admin instructions. for testing     inFLIXimab (REMICADE IV) Inject 100 mg into the vein every 6 (six) weeks.     inFLIXimab in sodium chloride 0.9 % Inject into the vein. Pt receives this infusion every 6 weeks     irbesartan (AVAPRO) 300 MG tablet Take 1 tablet (300 mg total) by mouth daily. 30 tablet 0   Magnesium  250 MG TABS Take 1 tablet (250 mg total) by mouth daily. 30 tablet 0   Methotrexate Sodium (METHOTREXATE, PF,) 50 MG/2ML injection INJECT 1 ML ONCE WEEKLY. (25 mg)  DISCARD VIAL AFTER USE     naproxen sodium (ALEVE) 220 MG tablet Take 220 mg by mouth 2 (two) times daily as needed (pain).     pantoprazole (PROTONIX) 40 MG tablet Take 1 tablet (40 mg total) by mouth daily. 30 tablet 0   Polyethyl Glycol-Propyl Glycol (SYSTANE OP) Systane (PF)  prn     predniSONE (DELTASONE) 10 MG tablet Take 4 tabs po daily x 2 days; then 3 tabs for 2 days; then 2 tabs for 2 days; then 1 tab for 2 days 20 tablet 0   Propylene Glycol (SYSTANE BALANCE) 0.6 % SOLN Place 1 drop into both eyes 2 (two) times daily as needed (dry eyes).     sertraline (ZOLOFT) 25 MG tablet Take 50 mg by mouth daily.      Tiotropium Bromide Monohydrate (SPIRIVA RESPIMAT) 2.5 MCG/ACT AERS Inhale 2 puffs into the lungs daily. 4 g 6   VITAMIN A PO Take 2,400 mcg by mouth daily.      vitamin B-12 (CYANOCOBALAMIN) 1000 MCG tablet Take 1 tablet (1,000 mcg total) by mouth daily. 30 tablet 0   atorvastatin (LIPITOR) 40 MG tablet Take 40 mg by mouth daily. (Patient not taking: Reported on 06/06/2021)     Dextromethorphan-guaiFENesin 10-100 MG/5ML liquid Take 5 mLs by mouth every 12 (twelve) hours. (Patient not taking: Reported on 07/20/2021) 118 mL 1   No facility-administered medications prior to visit.    Review of Systems  Constitutional:  Negative for chills, diaphoresis, fever, malaise/fatigue and weight loss.  HENT:  Negative for congestion.   Respiratory:  Positive for cough, sputum production and shortness of breath. Negative for hemoptysis and wheezing.   Cardiovascular:  Negative for chest pain, palpitations and leg swelling.   Objective:   Vitals:   07/28/21 1349  BP: 126/82  Pulse: (!) 57  Temp: 97.8 F (36.6 C)  TempSrc: Oral  SpO2: 95%  Weight: 191 lb (86.6 kg)  Height: 5' 2"  (1.575 m)   Physical Exam: General:  Well-appearing, no acute distress HENT: [place], AT Eyes: EOMI, no scleral icterus Respiratory: Clear to auscultation bilaterally.  No crackles, wheezing or rales Cardiovascular: RRR, -M/R/G, no JVD Extremities:-Edema,-tenderness Neuro: AAO x4, CNII-XII grossly intact Psych: Normal mood, normal affect  Chest imaging: CXR 01/11/18 - No pulmonary edema, effusion or infiltrate CT Chest 07/28/21 - Chronic bronchitis. Minimal peripheral focal inflammatory changes in the RLL  PFT:  08/21/18 -  FVC 2.4 (154%) FEV1 2.21 (156%) Ratio 74 TLC 104% DLCO corrected 78% Interpretation: Normal spirometry and lung volumes with mildly reduced DLCO  Mild obstructive defect present with mildly reduced DLCO. TLC normal. No significant bronchodilator effect present however does not preclude benefit of bronchodilator therapy.    Assessment & Plan:   80 year old female with RA on methotrexate who presents for follow-up. Respiratory culture 11/21 + Pseudomonas aeruginosa, pan-sensitive except for Zosyn - intermediate. Reviewed CT chest with focal inflammatory changes. Will plan for repeat CT for resolution in the future  Pseudomonas Pneumonia - improved but persistent symptoms --CONTINUE additional 7 days of ciprofloxacin 500 mg TWICE a day for total 21 day treatment  Chronic bronchitis --CONTINUE Spiriva 2.5 mcg TWO puffs ONCE a day --CONTINUE Albuterol every 4 hours as needed for shortness of breath or wheezing  Immunization History  Administered Date(s) Administered   Fluad Quad(high Dose 65+) 04/21/2019, 05/20/2021   Influenza, High Dose Seasonal PF 05/20/2018   Influenza-Unspecified 06/16/2019   PFIZER(Purple Top)SARS-COV-2 Vaccination 09/11/2019, 09/25/2019, 05/14/2020, 11/25/2020   Meds ordered this encounter  Medications   ciprofloxacin (CIPRO) 500 MG tablet    Sig: Take 1 tablet (500 mg total) by mouth 2 (two) times daily for 7 days.    Dispense:  14 tablet    Refill:  0   DISCONTD: Tiotropium  Bromide Monohydrate (SPIRIVA RESPIMAT) 2.5 MCG/ACT AERS    Sig: Inhale 2 puffs into the lungs daily.    Dispense:  4 g    Refill:  5   Tiotropium Bromide Monohydrate (SPIRIVA RESPIMAT) 2.5 MCG/ACT AERS    Sig: Inhale 2 puffs into the lungs daily.    Dispense:  4 g    Refill:  0    Order Specific Question:   Lot Number?    Answer:   [id number]    Order Specific Question:   Expiration Date?    Answer:   05/19/2022   No orders of the defined types were placed in this encounter.  Return in about 2 months (around 10/02/2021).  I have spent a total time of 36-minutes on the day of the appointment reviewing prior documentation, coordinating care and discussing medical diagnosis and plan with the patient/family. Past medical history, allergies, medications were reviewed. Pertinent imaging, labs and tests included in this note have been reviewed and interpreted independently by me.  Elpidia Karn Rodman Pickle, MD [facility] 07/28/2021 2:04 PM

## 2021-08-08 ENCOUNTER — Other Ambulatory Visit: Payer: Self-pay

## 2021-08-08 ENCOUNTER — Ambulatory Visit (INDEPENDENT_AMBULATORY_CARE_PROVIDER_SITE_OTHER): Payer: Medicare Other | Admitting: Pharmacist Clinician (PhC)/ Clinical Pharmacy Specialist

## 2021-08-08 DIAGNOSIS — I10 Essential (primary) hypertension: Secondary | ICD-10-CM | POA: Diagnosis not present

## 2021-08-08 MED ORDER — AMLODIPINE BESYLATE 5 MG PO TABS: 5.0000 mg | ORAL_TABLET | Freq: Every day | ORAL | 6 refills | Status: DC

## 2021-08-08 NOTE — Patient Instructions (Signed)
Return for a a follow up appointment February 21 at 10:30  Check your blood pressure at home 3-4 times each week and keep record of the readings.  Take your BP meds as follows:  Start amlodipine 5 mg once daily at night  Continue with irbesartan 300 mg each morning  Bring all of your meds, your BP cuff and your record of home blood pressures to your next appointment.  Exercise as youre able, try to walk approximately 30 minutes per day.  Keep salt intake to a minimum, especially watch canned and prepared boxed foods.  Eat more fresh fruits and vegetables and fewer canned items.  Avoid eating in fast food restaurants.    HOW TO TAKE YOUR BLOOD PRESSURE: Rest 5 minutes before taking your blood pressure.  Dont smoke or drink caffeinated beverages for at least 30 minutes before. Take your blood pressure before (not after) you eat. Sit comfortably with your back supported and both feet on the floor (dont cross your legs). Elevate your arm to heart level on a table or a desk. Use the proper sized cuff. It should fit smoothly and snugly around your bare upper arm. There should be enough room to slip a fingertip under the cuff. The bottom edge of the cuff should be 1 inch above the crease of the elbow. Ideally, take 3 measurements at one sitting and record the averag

## 2021-08-08 NOTE — Assessment & Plan Note (Addendum)
Patient with essential hypertension, not controlled on just irbesartan 300 mg.  She does have a wide range of readings, some of this could be due to chronic pain issues.  Will add amlodipine 5 mg and have her take at night, as morning readings are higher.  She should continue to monitor home BP readings 3-4 times each week and keep a log of those readings.  We will see her back in the office in 2 months for follow up.

## 2021-08-08 NOTE — Progress Notes (Signed)
08/08/2021 MALKIE WILLE 01/20/1941 [id number]   HPI:  Joy [name] is a 80 y.o. female patient of Dr Oval Linsey, with a [place] below who presents today for hypertension clinic evaluation.  At her visit with Dr. Oval Linsey her pressure was noted to be 154/78, however patient reports most home readings in the 938'H systolic.  She usually takes her medications in the mornings, however has no set schedule/time for taking meds.  Because she wasn't in a routine with her medications, it was decided that she would work on consistency and continue home checks.  She should return in 1-2 months for evaluation.  Today she returns to the office.  She has been doing well with the irbesartan, has no issues with compliance or side effects.  She does note that she stopped atorvastatin.  It was unsure if this was contributing to her chronic pain issues.  She lives with her daughter and son-in-law and notes that her daughter is a very good cook and conscientious about eating healthy.   Past Medical History: hyperlipidemia LDL 117, on atorvastatin 40  COPD Albuterol, Spiriva  Pre-diabetes A1c 6.2 in 2021  Rheumatoid arthritis Chronic pain     Blood Pressure Goal:  130/80  Current Medications: irbesartan 300 mg qd  Family Hx: both parents died from MI, mother also had aneurysm; 5 siblings, mostly with DM and hypertension, none died from heart disease.  Daughter now 29, has had 2 MI (followed by [facility] as well)  Social Hx: previous smoker, quit 27 years ago; occasional social alcohol; coffee 2-3 cups per day, occasionally from [employer]  Diet: daughter does cooking, watches sodium; vegetables are fresh, lots of chicken and fish.  Notes she does eat smaller portions than she did previously; also likes sweets - cakes, ice cream  Exercise: yoga and water aerobics at [facility] BP readings: has home Omron cuff, read within 1 point on both systolic and diastolic in the office  33 AM readings average 156/88   HR 70  (range 128-174/64-114)  29 PM readings average 140/80  HR 73  (range 119-167/70-110)  Intolerances: no cardiac medication intolerances  Labs: 6/22:  Na 1338, K 3.6, Glu 166, BUN 23, SCr 0.88, GFR >60   Wt Readings from Last 3 Encounters:  08/08/21 192 lb 12.8 oz (87.5 kg)  07/28/21 191 lb (86.6 kg)  07/20/21 191 lb 6.4 oz (86.8 kg)   BP Readings from Last 3 Encounters:  08/08/21 (!) 148/80  07/28/21 126/82  07/20/21 130/80   Pulse Readings from Last 3 Encounters:  08/08/21 (!) 58  07/28/21 (!) 57  07/20/21 73    Current Outpatient Medications  Medication Sig Dispense Refill   albuterol (VENTOLIN HFA) 108 (90 Base) MCG/ACT inhaler Inhale 2 puffs into the lungs every 6 (six) hours as needed for wheezing or shortness of breath. 6.7 g 2   amLODipine (NORVASC) 5 MG tablet Take 1 tablet (5 mg total) by mouth daily. 30 tablet 6   Biotin 1000 MCG tablet Take 1 tablet (1 mg total) by mouth daily. 30 tablet 0   Cholecalciferol (VITAMIN D3) 25 MCG (1000 UT) CAPS Take 1 capsule (1,000 Units total) by mouth daily. 30 capsule 0   Dextromethorphan-guaiFENesin 10-100 MG/5ML liquid Take 5 mLs by mouth every 12 (twelve) hours. 118 mL 1   famotidine (PEPCID) 20 MG tablet Take 20 mg by mouth daily.     folic acid (FOLVITE) 1 MG tablet Take 1 tablet (1 mg total) by  mouth daily. 30 tablet 0   gabapentin (NEURONTIN) 300 MG capsule Take 300 mg by mouth at bedtime.  1   ID NOW COVID-19 KIT See admin instructions. for testing     inFLIXimab (REMICADE IV) Inject 100 mg into the vein every 6 (six) weeks.     irbesartan (AVAPRO) 300 MG tablet Take 1 tablet (300 mg total) by mouth daily. 30 tablet 0   Magnesium 250 MG TABS Take 1 tablet (250 mg total) by mouth daily. 30 tablet 0   Methotrexate Sodium (METHOTREXATE, PF,) 50 MG/2ML injection INJECT 1 ML ONCE WEEKLY. (25 mg)  DISCARD VIAL AFTER USE     naproxen sodium (ALEVE) 220 MG tablet Take 220 mg by mouth 2 (two) times daily as needed (pain).      pantoprazole (PROTONIX) 40 MG tablet Take 1 tablet (40 mg total) by mouth daily. 30 tablet 0   Polyethyl Glycol-Propyl Glycol (SYSTANE OP) Systane (PF)  prn     Probiotic Product (PROBIOTIC DAILY PO) Take by mouth.     Propylene Glycol (SYSTANE BALANCE) 0.6 % SOLN Place 1 drop into both eyes 2 (two) times daily as needed (dry eyes).     sertraline (ZOLOFT) 25 MG tablet Take 50 mg by mouth daily.      Tiotropium Bromide Monohydrate (SPIRIVA RESPIMAT) 2.5 MCG/ACT AERS Inhale 2 puffs into the lungs daily. 4 g 0   VITAMIN A PO Take 2,400 mcg by mouth daily.      vitamin B-12 (CYANOCOBALAMIN) 1000 MCG tablet Take 1 tablet (1,000 mcg total) by mouth daily. 30 tablet 0   atorvastatin (LIPITOR) 40 MG tablet Take 40 mg by mouth daily. (Patient not taking: Reported on 06/06/2021)     azithromycin (ZITHROMAX) 250 MG tablet Zpack taper as directed (Patient not taking: Reported on 08/08/2021) 6 tablet 0   predniSONE (DELTASONE) 10 MG tablet Take 4 tabs po daily x 2 days; then 3 tabs for 2 days; then 2 tabs for 2 days; then 1 tab for 2 days (Patient not taking: Reported on 08/08/2021) 20 tablet 0   No current facility-administered medications for this visit.    Allergies  Allergen Reactions   Codeine Other (See Comments)    Lump in throat   Tape Hives and Rash    Paper tape only   Latex Rash   Nickel Rash    Bumps Also other metals    Past Medical History:  Diagnosis Date   Ankylosing spondylitis (HCC)    Anxiety    Arthritis    RHEUMATOID   Back pain    Bronchitis    Constipation    COPD (chronic obstructive pulmonary disease) ([place])    CXR 01/11/18 showed mild COPD and chronic bronchitis   CTS (carpal tunnel syndrome)    Dry eye    Dry mouth    Dysrhythmia    "irregularity" unknown at this time - being evaluated by cardiology   Essential hypertension 11/24/2015   GERD (gastroesophageal reflux disease)    HLA B27 (HLA B27 positive)    Hypercholesteremia    Hyperlipidemia 11/24/2015    Hypertension    IBS (irritable bowel syndrome)    Joint pain    Neuropathy    OAB (overactive bladder)    Obesity    Osteoarthritis    Osteoporosis    Pre-diabetes    Rheumatoid arthritis ([place]) 11/24/2015   Sciatica    Seasonal allergies    Spinal stenosis     Blood pressure (!) 148/80,  pulse (!) 58, resp. rate 15, height _0  (1.575 m), weight 192 lb 12.8 oz (87.5 kg).  Essential hypertension Patient with essential hypertension, not controlled on just irbesartan 300 mg.  She does have a wide range of readings, some of this could be due to chronic pain issues.  Will add amlodipine 5 mg and have her take at night, as morning readings are higher.  She should continue to monitor home BP readings 3-4 times each week and keep a log of those readings.  We will see her back in the office in 2 months for follow up.     Tommy Medal PharmD CPP [facility] [address] [place], [place] [address] [phone]

## 2021-08-09 DIAGNOSIS — M461 Sacroiliitis, not elsewhere classified: Secondary | ICD-10-CM | POA: Diagnosis not present

## 2021-08-11 NOTE — Progress Notes (Signed)
Did you see CT imaging at your visit with her on 12/9, she had HRCT prior to your visit.   Do you need repeat CT for pulmonary nodules/ does she need to be called

## 2021-08-16 ENCOUNTER — Encounter: Payer: Self-pay | Admitting: Pulmonary Disease

## 2021-08-25 ENCOUNTER — Telehealth: Payer: Self-pay | Admitting: Pulmonary Disease

## 2021-08-25 DIAGNOSIS — I10 Essential (primary) hypertension: Secondary | ICD-10-CM | POA: Diagnosis not present

## 2021-08-25 DIAGNOSIS — R1030 Lower abdominal pain, unspecified: Secondary | ICD-10-CM | POA: Diagnosis not present

## 2021-08-25 DIAGNOSIS — J151 Pneumonia due to Pseudomonas: Secondary | ICD-10-CM

## 2021-08-25 DIAGNOSIS — F419 Anxiety disorder, unspecified: Secondary | ICD-10-CM | POA: Diagnosis not present

## 2021-08-25 DIAGNOSIS — E559 Vitamin D deficiency, unspecified: Secondary | ICD-10-CM | POA: Diagnosis not present

## 2021-08-25 DIAGNOSIS — K219 Gastro-esophageal reflux disease without esophagitis: Secondary | ICD-10-CM | POA: Diagnosis not present

## 2021-08-25 DIAGNOSIS — E7800 Pure hypercholesterolemia, unspecified: Secondary | ICD-10-CM | POA: Diagnosis not present

## 2021-08-25 DIAGNOSIS — K5900 Constipation, unspecified: Secondary | ICD-10-CM | POA: Diagnosis not present

## 2021-08-25 DIAGNOSIS — M069 Rheumatoid arthritis, unspecified: Secondary | ICD-10-CM | POA: Diagnosis not present

## 2021-08-25 DIAGNOSIS — G629 Polyneuropathy, unspecified: Secondary | ICD-10-CM | POA: Diagnosis not present

## 2021-08-25 NOTE — Telephone Encounter (Signed)
Primary Pulmonologist: Last office visit and with whom: 07/28/2021 Everardo All What do we see them for (pulmonary problems): Pneumonia d/t pseudomonas species Last OV assessment/plan:  Assessment & Plan:    81 year old female with RA on methotrexate who presents for follow-up. Respiratory culture 11/21 + Pseudomonas aeruginosa, pan-sensitive except for Zosyn - intermediate. Reviewed CT chest with focal inflammatory changes. Will plan for repeat CT for resolution in the future   Pseudomonas Pneumonia - improved but persistent symptoms --CONTINUE additional 7 days of ciprofloxacin 500 mg TWICE a day for total 21 day treatment   Chronic bronchitis --CONTINUE Spiriva 2.5 mcg TWO puffs ONCE a day --CONTINUE Albuterol every 4 hours as needed for shortness of breath or wheezing       Immunization History  Administered Date(s) Administered   Fluad Quad(high Dose 65+) 04/21/2019, 05/20/2021   Influenza, High Dose Seasonal PF 05/20/2018   Influenza-Unspecified 06/16/2019   PFIZER(Purple Top)SARS-COV-2 Vaccination 09/11/2019, 09/25/2019, 05/14/2020, 11/25/2020         Meds ordered this encounter  Medications   ciprofloxacin (CIPRO) 500 MG tablet      Sig: Take 1 tablet (500 mg total) by mouth 2 (two) times daily for 7 days.      Dispense:  14 tablet      Refill:  0   DISCONTD: Tiotropium Bromide Monohydrate (SPIRIVA RESPIMAT) 2.5 MCG/ACT AERS      Sig: Inhale 2 puffs into the lungs daily.      Dispense:  4 g      Refill:  5   Tiotropium Bromide Monohydrate (SPIRIVA RESPIMAT) 2.5 MCG/ACT AERS      Sig: Inhale 2 puffs into the lungs daily.      Dispense:  4 g      Refill:  0      Order Specific Question:   Lot Number?      Answer:   [id number]      Order Specific Question:   Expiration Date?      Answer:   05/19/2022    No orders of the defined types were placed in this encounter.   Return in about 2 months (around 10/02/2021).   I have spent a total time of 36-minutes on the day of the  appointment reviewing prior documentation, coordinating care and discussing medical diagnosis and plan with the patient/family. Past medical history, allergies, medications were reviewed. Pertinent imaging, labs and tests included in this note have been reviewed and interpreted independently by me.   Chi Mechele Collin, MD [facility] 07/28/2021 2:04 PM             Patient Instructions by Luciano Cutter, MD at 07/28/2021 1:45 PM  Author: Luciano Cutter, MD Author Type: Physician Filed: 07/28/2021  2:13 PM  Note Status: Addendum Cosign: Cosign Not Required Encounter Date: 07/28/2021  Editor: Luciano Cutter, MD (Physician)      Prior Versions: 1. Luciano Cutter, MD (Physician) at 07/28/2021  2:12 PM - Signed    Pseudomonas Pneumonia - improved but persistent symptoms --CONTINUE additional 7 days of ciprofloxacin 500 mg TWICE a day for total 21 day treatment   Chronic bronchitis --CONTINUE Spiriva 2.5 mcg TWO puffs ONCE a day --CONTINUE Albuterol every 4 hours as needed for shortness of breath or wheezing   Schedule follow-up with me in Feb 2023       Orthostatic Vitals Recorded in This Encounter   07/28/2021  1349     Patient Position: Sitting  BP  Location: Left Arm  Cuff Size: Normal   Instructions    Return in about 2 months (around 10/02/2021).  Pseudomonas Pneumonia - improved but persistent symptoms --CONTINUE additional 7 days of ciprofloxacin 500 mg TWICE a day for total 21 day treatment   Chronic bronchitis --CONTINUE Spiriva 2.5 mcg TWO puffs ONCE a day --CONTINUE Albuterol every 4 hours as needed for shortness of breath or wheezing   Schedule follow-up with me in Feb 2023       Was appointment offered to patient (explain)?  no   Reason for call: Pt still has productive cough (beige/brown/green).  No fever, body aches or chills.  Finished her antibiotic 3 weeks ago.  Mucous started changing color 2 weeks ago.  Still has a very  deep cough.  Denies chest congestion or sob.  She is still using the Dextromethorphan-guaifenesin cough syrup, but not on a regular basis.  She states the cough never really went away.  States her meds are helping but not enough.   Dr. Everardo All, please advise.  Thank you.  (examples of things to ask: : When did symptoms start? Fever? Cough? Productive? Color to sputum? More sputum than usual? Wheezing? Have you needed increased oxygen? Are you taking your respiratory medications? What over the counter measures have you tried?)  Allergies  Allergen Reactions   Codeine Other (See Comments)    Lump in throat   Tape Hives and Rash    Paper tape only   Latex Rash   Nickel Rash    Bumps Also other metals    Immunization History  Administered Date(s) Administered   Fluad Quad(high Dose 65+) 04/21/2019, 05/20/2021   Influenza, High Dose Seasonal PF 05/20/2018   Influenza-Unspecified 06/16/2019   PFIZER(Purple Top)SARS-COV-2 Vaccination 09/11/2019, 09/25/2019, 05/14/2020, 11/25/2020

## 2021-08-25 NOTE — Telephone Encounter (Signed)
[facility] Telephone Encounter  Called patient. Recently treated for Pseudomonas pneumonia x 3 weeks total. Improved cough however persistent with sputum production. Given high risk of resistance to PO agents, will refer to ID. Patient states symptoms are tolerable for now and will await appointment for further management. She was advised to call back if symptoms significantly worsen and we can treat if needed before her ID visit.  Plan Amb referral to Infectious Disease Continue current bronchodilators

## 2021-08-28 ENCOUNTER — Other Ambulatory Visit: Payer: Self-pay | Admitting: Family Medicine

## 2021-08-28 DIAGNOSIS — R1030 Lower abdominal pain, unspecified: Secondary | ICD-10-CM

## 2021-08-31 ENCOUNTER — Ambulatory Visit
Admission: RE | Admit: 2021-08-31 | Discharge: 2021-08-31 | Disposition: A | Discharge status: Home / Self Care | Payer: Medicare Other | Source: Ambulatory Visit | Attending: Internal Medicine | Admitting: Internal Medicine

## 2021-08-31 ENCOUNTER — Ambulatory Visit (INDEPENDENT_AMBULATORY_CARE_PROVIDER_SITE_OTHER): Payer: Medicare Other | Admitting: Internal Medicine

## 2021-08-31 ENCOUNTER — Encounter: Payer: Self-pay | Admitting: Internal Medicine

## 2021-08-31 ENCOUNTER — Other Ambulatory Visit: Payer: Self-pay

## 2021-08-31 VITALS — BP 146/80 | HR 70 | Temp 97.7°F | Resp 16 | Wt 190.4 lb

## 2021-08-31 DIAGNOSIS — J411 Mucopurulent chronic bronchitis: Secondary | ICD-10-CM

## 2021-08-31 DIAGNOSIS — R059 Cough, unspecified: Secondary | ICD-10-CM | POA: Diagnosis not present

## 2021-08-31 DIAGNOSIS — J151 Pneumonia due to Pseudomonas: Secondary | ICD-10-CM

## 2021-08-31 LAB — BASIC METABOLIC PANEL
BUN: 19 mg/dL (ref 7–25)
CO2: 27 mmol/L (ref 20–32)
Calcium: 9.3 mg/dL (ref 8.6–10.4)
Chloride: 106 mmol/L (ref 98–110)
Creat: 0.82 mg/dL (ref 0.60–0.95)
Glucose, Bld: 79 mg/dL (ref 65–99)
Potassium: 4.3 mmol/L (ref 3.5–5.3)
Sodium: 137 mmol/L (ref 135–146)

## 2021-08-31 LAB — CBC WITH DIFFERENTIAL/PLATELET
Absolute Monocytes: 400 cells/uL (ref 200–950)
Basophils Absolute: 39 cells/uL (ref 0–200)
Basophils Relative: 0.9 %
Eosinophils Absolute: 112 cells/uL (ref 15–500)
Eosinophils Relative: 2.6 %
HCT: 39.7 % (ref 35.0–45.0)
Hemoglobin: 12.9 g/dL (ref 11.7–15.5)
Lymphs Abs: 1797 cells/uL (ref 850–3900)
MCH: 31.1 pg (ref 27.0–33.0)
MCHC: 32.5 g/dL (ref 32.0–36.0)
MCV: 95.7 fL (ref 80.0–100.0)
MPV: 10 fL (ref 7.5–12.5)
Monocytes Relative: 9.3 %
Neutro Abs: 1952 cells/uL (ref 1500–7800)
Neutrophils Relative %: 45.4 %
Platelets: 253 10*3/uL (ref 140–400)
RBC: 4.15 10*6/uL (ref 3.80–5.10)
RDW: 13.6 % (ref 11.0–15.0)
Total Lymphocyte: 41.8 %
WBC: 4.3 10*3/uL (ref 3.8–10.8)

## 2021-08-31 NOTE — Progress Notes (Signed)
[facility]ious [facility]      Reason for Consult: Pseudomonal infection    Referring Physician: Dr. Loanne Drilling    Patient ID: Joy [name], female    DOB: 1940-09-28, 81 y.o.   MRN: [id number]  HPI:   Here with history of Pseudomonas infection in lungs.   She has a remote history of smoking and known chronic bronchitis followed by pulmonary, Dr. Loanne Drilling. In September she developed worsening cough, brownish sputum and given prednisone and restarted Spiriva, which she previously had been on.  She returned in October with worsening cough and given azithromycin and cough medication.  She returned in November with persistent symptoms and a sputum culture was sent, positive for Pseudomonas and treated with ciprofloxacin for 3 weeks.  Also did a CT scan, high resolution, and no ILD noted.  She continues to have persistent cough, brownish color and sent here for evaluation with concern for persistent Pseudomonas.  No recent sputum cultures done.  No fever, no chills, no significant sob, mainly just cough.   Past Medical History:  Diagnosis Date   Ankylosing spondylitis (HCC)    Anxiety    Arthritis    RHEUMATOID   Back pain    Bronchitis    Constipation    COPD (chronic obstructive pulmonary disease) ([place])    CXR 01/11/18 showed mild COPD and chronic bronchitis   CTS (carpal tunnel syndrome)    Dry eye    Dry mouth    Dysrhythmia    "irregularity" unknown at this time - being evaluated by cardiology   Essential hypertension 11/24/2015   GERD (gastroesophageal reflux disease)    HLA B27 (HLA B27 positive)    Hypercholesteremia    Hyperlipidemia 11/24/2015   Hypertension    IBS (irritable bowel syndrome)    Joint pain    Neuropathy    OAB (overactive bladder)    Obesity    Osteoarthritis    Osteoporosis    Pre-diabetes    Rheumatoid arthritis ([place]) 11/24/2015   Sciatica    Seasonal allergies    Spinal stenosis     Prior to Admission medications   Medication Sig  Start Date End Date Taking? Authorizing Provider  amLODipine (NORVASC) 5 MG tablet Take 1 tablet (5 mg total) by mouth daily. 08/08/21  Yes Skeet Latch, MD  atorvastatin (LIPITOR) 40 MG tablet Take 40 mg by mouth daily.   Yes [provider]  Biotin 1000 MCG tablet Take 1 tablet (1 mg total) by mouth daily. 07/09/18  Yes Hennie Duos, MD  Cholecalciferol (VITAMIN D3) 25 MCG (1000 UT) CAPS Take 1 capsule (1,000 Units total) by mouth daily. 07/09/18  Yes Hennie Duos, MD  Dextromethorphan-guaiFENesin 10-100 MG/5ML liquid Take 5 mLs by mouth every 12 (twelve) hours. 05/01/21  Yes Margaretha Seeds, MD  folic acid (FOLVITE) 1 MG tablet Take 1 tablet (1 mg total) by mouth daily. 07/09/18  Yes Hennie Duos, MD  gabapentin (NEURONTIN) 300 MG capsule Take 300 mg by mouth at bedtime. 10/27/15  Yes [provider]  inFLIXimab (REMICADE IV) Inject 100 mg into the vein every 6 (six) weeks.   Yes [provider]  irbesartan (AVAPRO) 300 MG tablet Take 1 tablet (300 mg total) by mouth daily. 07/09/18  Yes Hennie Duos, MD  Magnesium 250 MG TABS Take 1 tablet (250 mg total) by mouth daily. 07/09/18  Yes Hennie Duos, MD  Methotrexate Sodium (METHOTREXATE, PF,) 50 MG/2ML injection INJECT 1  ML ONCE WEEKLY. (25 mg)  DISCARD VIAL AFTER USE 11/13/18  Yes [provider]  naproxen sodium (ALEVE) 220 MG tablet Take 220 mg by mouth 2 (two) times daily as needed (pain).   Yes [provider]  Probiotic Product (PROBIOTIC DAILY PO) Take by mouth.   Yes [provider]  Propylene Glycol (SYSTANE BALANCE) 0.6 % SOLN Place 1 drop into both eyes 2 (two) times daily as needed (dry eyes).   Yes [provider]  sertraline (ZOLOFT) 25 MG tablet Take 50 mg by mouth daily.    Yes [provider]  Tiotropium Bromide Monohydrate (SPIRIVA RESPIMAT) 2.5 MCG/ACT AERS Inhale 2 puffs into the lungs daily. 07/28/21  Yes Margaretha Seeds, MD   VITAMIN A PO Take 2,400 mcg by mouth daily.    Yes [provider]  vitamin B-12 (CYANOCOBALAMIN) 1000 MCG tablet Take 1 tablet (1,000 mcg total) by mouth daily. 07/09/18  Yes Hennie Duos, MD  ID NOW COVID-19 KIT See admin instructions. for testing 12/18/19   [provider]  Polyethyl Glycol-Propyl Glycol (SYSTANE OP) Systane (PF)  prn    [provider]    Allergies  Allergen Reactions   Codeine Other (See Comments)    Lump in throat   Tape Hives and Rash    Paper tape only   Latex Rash   Nickel Rash    Bumps Also other metals    Social History   Tobacco Use   Smoking status: Former    Packs/day: 1.00    Years: 35.00    Pack years: 35.00    Types: Cigarettes    Quit date: 05/29/1995    Years since quitting: 26.2   Smokeless tobacco: Never  Vaping Use   Vaping Use: Never used  Substance Use Topics   Alcohol use: Yes    Comment: 1 glass of wine daily   Drug use: No    Family History  Problem Relation Age of Onset   Heart disease Mother    Cerebral aneurysm Mother    Hypertension Mother    Obesity Mother    Heart attack Father    Heart disease Father    Hypertension Father    Hypertension Sister    Arthritis Sister    Stroke Sister    Hypertension Sister    Arthritis Sister    Hypertension Sister    Arthritis Sister    Hypertension Sister    Diabetes Sister    Hypertension Sister    Diabetes Sister    Rheum arthritis Maternal Grandmother    Heart attack Daughter     Review of Systems  Constitutional: negative for fevers, chills, and malaise Respiratory: positive for cough, sputum, or chronic bronchitis, negative for hemoptysis Gastrointestinal: negative for nausea and diarrhea All other systems reviewed and are negative    Constitutional: in no apparent distress  Vitals:   08/31/21 0903  BP: (!) 146/80  Pulse: 70  Resp: 16  Temp: 97.7 F (36.5 C)  SpO2: 100%   EYES: anicteric Cardiovascular: Cor  RRR Respiratory: CTA B Musculoskeletal: no pedal edema noted Skin: no rash Neuro: non-focal  Labs: Lab Results  Component Value Date   WBC 4.3 02/15/2021   HGB 11.9 (L) 02/15/2021   HCT 36.1 02/15/2021   MCV 95.3 02/15/2021   PLT 233 02/15/2021    Lab Results  Component Value Date   CREATININE 0.88 02/15/2021   BUN 23 02/15/2021   NA 138 02/15/2021  K 3.6 02/15/2021   CL 107 02/15/2021   CO2 27 02/15/2021    Lab Results  Component Value Date   ALT 10 09/30/2019   AST 18 09/30/2019   ALKPHOS 72 09/30/2019   BILITOT 0.5 09/30/2019   INR 0.99 12/16/2015     Assessment: subacute exacerbation of chronic bronchitis.  She has persistent symptoms and concern for ongoing Pseudomonas and I suspect cipro resistance though will recheck.  Also will see if it is a different bacteria that grows.   If Pseudomonas still present, will consider inhaled amikacin and oral or IV antibacterial based on sensitivities.   With other bacteria, will consider alternative treatment.   Plan: 1)  CXR to be sure no active opacity 2) bmp in case she needs a picc line 3) cbc to check for acute infection 4) sputum culture for targeted therapy  She will be callled with plan once sputum culture growth, if any, identified.

## 2021-09-06 DIAGNOSIS — M0589 Other rheumatoid arthritis with rheumatoid factor of multiple sites: Secondary | ICD-10-CM | POA: Diagnosis not present

## 2021-09-07 ENCOUNTER — Encounter (HOSPITAL_BASED_OUTPATIENT_CLINIC_OR_DEPARTMENT_OTHER): Payer: Self-pay | Admitting: Cardiovascular Disease

## 2021-09-07 NOTE — Telephone Encounter (Signed)
Please advise 

## 2021-09-12 DIAGNOSIS — R1312 Dysphagia, oropharyngeal phase: Secondary | ICD-10-CM | POA: Diagnosis not present

## 2021-09-12 DIAGNOSIS — K219 Gastro-esophageal reflux disease without esophagitis: Secondary | ICD-10-CM | POA: Diagnosis not present

## 2021-09-15 ENCOUNTER — Other Ambulatory Visit: Payer: Self-pay

## 2021-09-15 ENCOUNTER — Encounter: Payer: Self-pay | Admitting: Internal Medicine

## 2021-09-15 ENCOUNTER — Ambulatory Visit (INDEPENDENT_AMBULATORY_CARE_PROVIDER_SITE_OTHER): Payer: Medicare Other | Admitting: Internal Medicine

## 2021-09-15 DIAGNOSIS — J411 Mucopurulent chronic bronchitis: Secondary | ICD-10-CM | POA: Diagnosis not present

## 2021-09-15 DIAGNOSIS — Z5181 Encounter for therapeutic drug level monitoring: Secondary | ICD-10-CM | POA: Insufficient documentation

## 2021-09-15 DIAGNOSIS — J151 Pneumonia due to Pseudomonas: Secondary | ICD-10-CM

## 2021-09-15 NOTE — Progress Notes (Signed)
° °  Subjective:    Patient ID: Joy [name], female    DOB: August 10, 1941, 81 y.o.   MRN: [id number]  HPI Here for mucopurulent cough and recent Pseudomonas pulmonary infection.   She was seen earlier this month with ongoing symptoms despite recent treatment for Pseudomonas with oral cipro by Dr. Loanne Drilling.  This has been ongoing since about September.  She underwent high resolution CT and no ILD noted but air trapping and tracheobronchomalacia.  She was referred to me to consider treatment for Pseudomonas.  I repeated the CXR and no acute findings and she was asked to get a sputum culture but has not yet been done.  She has had no fever, no chills.  Feels about the same.    Review of Systems  Constitutional:  Negative for chills, fatigue and fever.  Respiratory:  Positive for cough. Negative for shortness of breath and wheezing.   Skin:  Negative for rash.      Objective:   Physical Exam Eyes:     General: No scleral icterus. Pulmonary:     Effort: Pulmonary effort is normal. No respiratory distress.     Breath sounds: No wheezing.  Neurological:     General: No focal deficit present.     Mental Status: She is alert.  Psychiatric:        Mood and Affect: Mood normal.   SH: no tobacco       Assessment & Plan:

## 2021-09-15 NOTE — Assessment & Plan Note (Signed)
No signs of active pneumonia on CXR, no fever or concerns.  She may though have some colonization with Pseudomonas and should see that on sputum culture if so.  She provided a sample today and will see if it grows out and we can consider treatment with IV vs inhaled therapy or both.

## 2021-09-15 NOTE — Assessment & Plan Note (Signed)
Continued problem and on inhalers per Dr. Everardo All

## 2021-09-15 NOTE — Assessment & Plan Note (Signed)
Have checked the bmp and wnl.

## 2021-09-17 ENCOUNTER — Other Ambulatory Visit: Payer: Self-pay | Admitting: Pulmonary Disease

## 2021-09-18 ENCOUNTER — Other Ambulatory Visit: Payer: Self-pay | Admitting: Pulmonary Disease

## 2021-09-18 ENCOUNTER — Telehealth: Payer: Self-pay

## 2021-09-18 ENCOUNTER — Other Ambulatory Visit (HOSPITAL_COMMUNITY): Payer: Self-pay

## 2021-09-18 LAB — RESPIRATORY CULTURE OR RESPIRATORY AND SPUTUM CULTURE
MICRO NUMBER:: 12929809
SPECIMEN QUALITY:: ADEQUATE

## 2021-09-18 NOTE — Telephone Encounter (Signed)
[facility] Patient Advocate Encounter   Received notification from [employer] that prior authorization for Joy [name] is required.   PA submitted on 09/18/2021 Key [id number] Status is pending    [facility] will continue to follow.   Clearance Coots, CPhT [facility]ty Pharmacy Patient [facility] for Infectious Disease Phone: [phone] Fax:  [phone]

## 2021-09-19 ENCOUNTER — Other Ambulatory Visit: Payer: Self-pay

## 2021-09-19 ENCOUNTER — Ambulatory Visit
Admission: RE | Admit: 2021-09-19 | Discharge: 2021-09-19 | Disposition: A | Discharge status: Home / Self Care | Payer: Medicare Other | Source: Ambulatory Visit | Attending: Family Medicine | Admitting: Family Medicine

## 2021-09-19 ENCOUNTER — Telehealth: Payer: Self-pay

## 2021-09-19 DIAGNOSIS — R1030 Lower abdominal pain, unspecified: Secondary | ICD-10-CM | POA: Diagnosis not present

## 2021-09-19 DIAGNOSIS — K5900 Constipation, unspecified: Secondary | ICD-10-CM | POA: Diagnosis not present

## 2021-09-19 DIAGNOSIS — K449 Diaphragmatic hernia without obstruction or gangrene: Secondary | ICD-10-CM | POA: Diagnosis not present

## 2021-09-19 DIAGNOSIS — K5730 Diverticulosis of large intestine without perforation or abscess without bleeding: Secondary | ICD-10-CM | POA: Diagnosis not present

## 2021-09-19 NOTE — Telephone Encounter (Signed)
-----   Message from Gardiner Barefoot, MD sent at 09/19/2021 12:09 PM EST ----- Please let her know that her sputum culture is again positive with Pseudomonas and we are working on getting approval for an inhaled antibiotic, which we discussed during the visit.  thanks

## 2021-09-19 NOTE — Telephone Encounter (Signed)
Patient aware of results and verbalized her understanding.   Altie Savard P Alixander Rallis, CMA  

## 2021-09-26 ENCOUNTER — Other Ambulatory Visit (HOSPITAL_COMMUNITY): Payer: Self-pay

## 2021-09-28 ENCOUNTER — Other Ambulatory Visit: Payer: Self-pay | Admitting: Internal Medicine

## 2021-09-28 MED ORDER — TOBRAMYCIN 300 MG/5ML IN NEBU: 300.0000 mg | INHALATION_SOLUTION | Freq: Two times a day (BID) | RESPIRATORY_TRACT | 0 refills | Status: DC

## 2021-09-29 ENCOUNTER — Telehealth: Payer: Self-pay

## 2021-09-29 ENCOUNTER — Telehealth (HOSPITAL_BASED_OUTPATIENT_CLINIC_OR_DEPARTMENT_OTHER): Payer: Self-pay

## 2021-09-29 ENCOUNTER — Other Ambulatory Visit (HOSPITAL_COMMUNITY): Payer: Self-pay

## 2021-09-29 MED ORDER — ROSUVASTATIN CALCIUM 20 MG PO TABS: 20.0000 mg | ORAL_TABLET | Freq: Every day | ORAL | 6 refills | Status: DC

## 2021-09-29 NOTE — Telephone Encounter (Signed)
Attempted to call patient, but got VM, will send recommendations in mychart message and prescription sent to pharmacy on file!     "Lipids are very uncontrolled off of atorvastatin.  Recommend trying rosuvastatin 20mg  and repeating lipids/CMP in 2-3 months.  If she has recurrent muscle aches we will have to stop it.   TCR "

## 2021-09-29 NOTE — Telephone Encounter (Signed)
[facility] Patient Advocate Encounter  Tobramycin is covered by patient Medicare B, with ICD code-A15.0 .   I called [facility] and updated them on ICD code and the patient will have a copay of $338.20.  There is no funds available at the time to help patient with copay.   I called and left a message for patient .  Ileene [name], [facility] Patient [facility] for Infectious Disease Phone: [phone] Fax:  [phone]

## 2021-10-02 ENCOUNTER — Encounter (HOSPITAL_BASED_OUTPATIENT_CLINIC_OR_DEPARTMENT_OTHER): Payer: Self-pay

## 2021-10-02 ENCOUNTER — Encounter: Payer: Self-pay | Admitting: Pulmonary Disease

## 2021-10-02 ENCOUNTER — Other Ambulatory Visit: Payer: Self-pay

## 2021-10-02 ENCOUNTER — Ambulatory Visit (INDEPENDENT_AMBULATORY_CARE_PROVIDER_SITE_OTHER): Payer: Medicare Other | Admitting: Pulmonary Disease

## 2021-10-02 VITALS — BP 130/80 | HR 83 | Temp 97.9°F | Ht 62.0 in | Wt 192.0 lb

## 2021-10-02 DIAGNOSIS — J42 Unspecified chronic bronchitis: Secondary | ICD-10-CM | POA: Diagnosis not present

## 2021-10-02 DIAGNOSIS — Z2239 Carrier of other specified bacterial diseases: Secondary | ICD-10-CM | POA: Diagnosis not present

## 2021-10-02 MED ORDER — SPIRIVA RESPIMAT 2.5 MCG/ACT IN AERS: 2.0000 | INHALATION_SPRAY | Freq: Every day | RESPIRATORY_TRACT | 11 refills | Status: DC

## 2021-10-02 MED ORDER — ALBUTEROL SULFATE HFA 108 (90 BASE) MCG/ACT IN AERS: 2.0000 | INHALATION_SPRAY | Freq: Four times a day (QID) | RESPIRATORY_TRACT | 0 refills | Status: DC | PRN

## 2021-10-02 NOTE — Patient Instructions (Signed)
Pseudomonas Pneumonia - s/p 3 weeks treatment with persistent symptoms --Referred to ID. Recommended inhaled tobramycin --ORDER nebulizer  Chronic bronchitis --CONTINUE Spiriva 2.5 mcg TWO puffs ONCE a day. REFILL --CONTINUE Albuterol every 4 hours as needed for shortness of breath or wheezing

## 2021-10-02 NOTE — Addendum Note (Signed)
Addended by: Darliss Ridgel on: 10/02/2021 11:17 AM   Modules accepted: Orders

## 2021-10-02 NOTE — Progress Notes (Signed)
Synopsis: Referred in 05/2018 for hx of chronic bronchitis x 2 years.   Subjective:   PATIENT ID: Joy [name] GENDER: female DOB: 09-20-1940, MRN: [id number]   HPI  Chief Complaint  Patient presents with   Follow-up    cough   [name] is an 81 year old female remote smoker with RA on methotrexate and infliximab who presents for follow-up  Synopsis: 2019 - Established [facility] for longstanding history of bronchitis. Started on Spiriva 2020 - Improved symptoms on Spiriva however discontinued due to cost.  2022 - Restarted on Spiriva. August - Augmentin. September - steroids. November +pseudomonas treated x 3 weeks with persistent symptoms  10/02/21 She was seen by ID on 09/15/21 with Dr. Linus Salmons. CXR repeated with no active disease however given Pseudomonas colonization, inhaled tobramycin ordered. Has not picked it up yet and will need a nebulizer. She has remained compliant with Spiriva. She continues to have productive cough with brown sputum that is unchanged. Taking mucinex once a day. Denies shortness of breath or wheezing. Rarely uses rescue inhaler.  Social History: Quit smoking in 1995. Smoked for 30 years x 1ppd.   Past Medical History:  Diagnosis Date   Ankylosing spondylitis (HCC)    Anxiety    Arthritis    RHEUMATOID   Back pain    Bronchitis    Constipation    COPD (chronic obstructive pulmonary disease) ([place])    CXR 01/11/18 showed mild COPD and chronic bronchitis   CTS (carpal tunnel syndrome)    Dry eye    Dry mouth    Dysrhythmia    "irregularity" unknown at this time - being evaluated by cardiology   Essential hypertension 11/24/2015   GERD (gastroesophageal reflux disease)    HLA B27 (HLA B27 positive)    Hypercholesteremia    Hyperlipidemia 11/24/2015   Hypertension    IBS (irritable bowel syndrome)    Joint pain    Neuropathy    OAB (overactive bladder)    Obesity    Osteoarthritis    Osteoporosis    Pre-diabetes     Rheumatoid arthritis ([place]) 11/24/2015   Sciatica    Seasonal allergies    Spinal stenosis     Allergies  Allergen Reactions   Codeine Other (See Comments)    Lump in throat   Tape Hives and Rash    Paper tape only   Latex Rash   Nickel Rash    Bumps Also other metals     Outpatient Medications Prior to Visit  Medication Sig Dispense Refill   Biotin 1000 MCG tablet Take 1 tablet (1 mg total) by mouth daily. 30 tablet 0   Cholecalciferol (VITAMIN D3) 25 MCG (1000 UT) CAPS Take 1 capsule (1,000 Units total) by mouth daily. 30 capsule 0   Dextromethorphan-guaiFENesin 10-100 MG/5ML liquid Take 5 mLs by mouth every 12 (twelve) hours. 482 mL 1   folic acid (FOLVITE) 1 MG tablet Take 1 tablet (1 mg total) by mouth daily. 30 tablet 0   gabapentin (NEURONTIN) 300 MG capsule Take 300 mg by mouth at bedtime.  1   ID NOW COVID-19 KIT See admin instructions. for testing     inFLIXimab (REMICADE IV) Inject 100 mg into the vein every 6 (six) weeks.     irbesartan (AVAPRO) 300 MG tablet Take 1 tablet (300 mg total) by mouth daily. 30 tablet 0   Magnesium 250 MG TABS Take 1 tablet (250 mg total) by mouth daily. 30 tablet 0  Methotrexate Sodium (METHOTREXATE, PF,) 50 MG/2ML injection INJECT 1 ML ONCE WEEKLY. (25 mg)  DISCARD VIAL AFTER USE     naproxen sodium (ALEVE) 220 MG tablet Take 220 mg by mouth 2 (two) times daily as needed (pain).     Polyethyl Glycol-Propyl Glycol (SYSTANE OP) Systane (PF)  prn     Probiotic Product (PROBIOTIC DAILY PO) Take by mouth.     Propylene Glycol (SYSTANE BALANCE) 0.6 % SOLN Place 1 drop into both eyes 2 (two) times daily as needed (dry eyes).     sertraline (ZOLOFT) 25 MG tablet Take 50 mg by mouth daily.      tobramycin, PF, (TOBI) 300 MG/5ML nebulizer solution Take 5 mLs (300 mg total) by nebulization 2 (two) times daily. 280 mL 0   VITAMIN A PO Take 2,400 mcg by mouth daily.      vitamin B-12 (CYANOCOBALAMIN) 1000 MCG tablet Take 1 tablet (1,000 mcg total) by  mouth daily. 30 tablet 0   rosuvastatin (CRESTOR) 20 MG tablet Take 1 tablet (20 mg total) by mouth daily. 30 tablet 6   Tiotropium Bromide Monohydrate (SPIRIVA RESPIMAT) 2.5 MCG/ACT AERS Inhale 2 puffs into the lungs daily. 4 g 0   amLODipine (NORVASC) 5 MG tablet Take 1 tablet (5 mg total) by mouth daily. (Patient not taking: Reported on 09/15/2021) 30 tablet 6   No facility-administered medications prior to visit.    Review of Systems  Constitutional:  Negative for chills, diaphoresis, fever, malaise/fatigue and weight loss.  HENT:  Negative for congestion.   Respiratory:  Positive for cough and sputum production. Negative for hemoptysis, shortness of breath and wheezing.   Cardiovascular:  Negative for chest pain, palpitations and leg swelling.   Objective:   Vitals:   10/02/21 1048  BP: 130/80  Pulse: 83  Temp: 97.9 F (36.6 C)  TempSrc: Oral  SpO2: 98%  Weight: 192 lb (87.1 kg)  Height: 5' 2"  (1.575 m)   Physical Exam: General: Well-appearing, no acute distress HENT: [place], AT Eyes: EOMI, no scleral icterus Respiratory: Clear to auscultation bilaterally.  No crackles, wheezing or rales Cardiovascular: RRR, -M/R/G, no JVD Extremities:-Edema,-tenderness Neuro: AAO x4, CNII-XII grossly intact Psych: Normal mood, normal affect  Chest imaging: CXR 01/11/18 - No pulmonary edema, effusion or infiltrate CT Chest 07/28/21 - Chronic bronchitis. Minimal peripheral focal inflammatory changes in the RLL CXR 08/31/21 - No acute infiltrate, effusion or edema. Unchanged coarse interstitial markings suggestive of chronic bronchitis  PFT:  08/21/18 -  FVC 2.4 (154%) FEV1 2.21 (156%) Ratio 74 TLC 104% DLCO corrected 78% Interpretation: Normal spirometry and lung volumes with mildly reduced DLCO  Mild obstructive defect present with mildly reduced DLCO. TLC normal. No significant bronchodilator effect present however does not preclude benefit of bronchodilator therapy.  Labs: Sputum  07/10/2021 Pseudomonas pansensitive except for intermediate Zosyn Sputum 09/15/2021 Pseudomonas pansensitive except for intermediate Zosyn    Assessment & Plan:  81 year old female with RA on methotrexate who presents for follow-up. Improved symptoms but persistent from her Pseudomonas pneumonia. She reports using [place] for at least 4+ years, which is on recall. Compliant with LAMA.  Pseudomonas Pneumonia - s/p 3 weeks treatment with persistent symptoms --Referred to ID. Recommended inhaled tobramycin --ORDER nebulizer  Chronic bronchitis --CONTINUE Spiriva 2.5 mcg TWO puffs ONCE a day. REFILL --CONTINUE Albuterol every 4 hours as needed for shortness of breath or wheezing. REFILL  Immunization History  Administered Date(s) Administered   Fluad Quad(high Dose 65+) 04/21/2019, 05/20/2021  Influenza, High Dose Seasonal PF 05/20/2018   Influenza-Unspecified 06/16/2019   PFIZER(Purple Top)SARS-COV-2 Vaccination 09/11/2019, 09/25/2019, 05/14/2020, 11/25/2020   Meds ordered this encounter  Medications   DISCONTD: Tiotropium Bromide Monohydrate (SPIRIVA RESPIMAT) 2.5 MCG/ACT AERS    Sig: Inhale 2 puffs into the lungs daily.    Dispense:  4 g    Refill:  11    Order Specific Question:   Lot Number?    Answer:   [id number]    Order Specific Question:   Expiration Date?    Answer:   05/19/2022   Tiotropium Bromide Monohydrate (SPIRIVA RESPIMAT) 2.5 MCG/ACT AERS    Sig: Inhale 2 puffs into the lungs daily.    Dispense:  4 g    Refill:  11    Order Specific Question:   Lot Number?    Answer:   [id number]    Order Specific Question:   Expiration Date?    Answer:   05/19/2022   No orders of the defined types were placed in this encounter.  Return in about 3 months (around 12/30/2021).  I have spent a total time of 30-minutes on the day of the appointment reviewing prior documentation, coordinating care and discussing medical diagnosis and plan with the patient/family. Past medical history,  allergies, medications were reviewed. Pertinent imaging, labs and tests included in this note have been reviewed and interpreted independently by me.  Aleisha Paone Rodman Pickle, MD [facility] 10/02/2021 11:02 AM

## 2021-10-03 NOTE — Telephone Encounter (Signed)
Pt is returning call.  

## 2021-10-03 NOTE — Telephone Encounter (Signed)
Returned patients call and reviewed new medication and labs with her!

## 2021-10-04 ENCOUNTER — Other Ambulatory Visit: Payer: Self-pay | Admitting: Internal Medicine

## 2021-10-04 ENCOUNTER — Telehealth: Payer: Self-pay | Admitting: Pharmacist

## 2021-10-04 MED ORDER — CIPROFLOXACIN HCL 500 MG PO TABS: 500.0000 mg | ORAL_TABLET | Freq: Two times a day (BID) | ORAL | 0 refills | Status: DC

## 2021-10-04 NOTE — Telephone Encounter (Signed)
Thank you so much Donna!

## 2021-10-04 NOTE — Telephone Encounter (Signed)
Thank you Lupita LeashDonna! I appreciate that.

## 2021-10-04 NOTE — Telephone Encounter (Signed)
I spoke to the patient and she is aware of the cipro medication but she said she was on cipro in the beginning and still had the coughing. She will restart Cipro and let us know how it goes.

## 2021-10-04 NOTE — Telephone Encounter (Signed)
Patient prescribed inhaled tobramycin for pseudomonas pneumonia. Prior authorization is approved but patient is unable to afford $338 copay. Unfortunately, no copay assistance is available for Medicare patients since it is an off-label indication. Patient asking what else she can take/try for her infection. Will route to Dr. Luciana Axe.

## 2021-10-05 DIAGNOSIS — H16223 Keratoconjunctivitis sicca, not specified as Sjogren's, bilateral: Secondary | ICD-10-CM | POA: Diagnosis not present

## 2021-10-05 DIAGNOSIS — H5713 Ocular pain, bilateral: Secondary | ICD-10-CM | POA: Diagnosis not present

## 2021-10-05 DIAGNOSIS — H43813 Vitreous degeneration, bilateral: Secondary | ICD-10-CM | POA: Diagnosis not present

## 2021-10-10 ENCOUNTER — Ambulatory Visit (INDEPENDENT_AMBULATORY_CARE_PROVIDER_SITE_OTHER): Payer: Medicare Other | Admitting: Pharmacist

## 2021-10-10 ENCOUNTER — Other Ambulatory Visit: Payer: Self-pay

## 2021-10-10 VITALS — BP 90/58 | HR 74 | Resp 17 | Ht 62.0 in | Wt 197.0 lb

## 2021-10-10 DIAGNOSIS — Z1589 Genetic susceptibility to other disease: Secondary | ICD-10-CM | POA: Insufficient documentation

## 2021-10-10 DIAGNOSIS — B370 Candidal stomatitis: Secondary | ICD-10-CM | POA: Insufficient documentation

## 2021-10-10 DIAGNOSIS — I10 Essential (primary) hypertension: Secondary | ICD-10-CM | POA: Diagnosis not present

## 2021-10-10 DIAGNOSIS — E7800 Pure hypercholesterolemia, unspecified: Secondary | ICD-10-CM | POA: Diagnosis not present

## 2021-10-10 DIAGNOSIS — R200 Anesthesia of skin: Secondary | ICD-10-CM | POA: Insufficient documentation

## 2021-10-10 DIAGNOSIS — M533 Sacrococcygeal disorders, not elsewhere classified: Secondary | ICD-10-CM | POA: Insufficient documentation

## 2021-10-10 DIAGNOSIS — G8220 Paraplegia, unspecified: Secondary | ICD-10-CM | POA: Insufficient documentation

## 2021-10-10 DIAGNOSIS — K5900 Constipation, unspecified: Secondary | ICD-10-CM | POA: Insufficient documentation

## 2021-10-10 MED ORDER — AMLODIPINE BESYLATE 2.5 MG PO TABS: 2.5000 mg | ORAL_TABLET | Freq: Every day | ORAL | 1 refills | Status: DC

## 2021-10-10 NOTE — Patient Instructions (Addendum)
It was nice meeting you today  Your blood pressure is a little low today so we will reduce your amlodipine to 2.5mg  daily.  You can cut your 5mg  tablets in half.  Continue your irbesartan 300mg  daily  For your cholesterol, please continue your rosuvastatin 20mg  daily  Continue to check your blood pressure at home  I will see you back in 4 weeks  Laural Goldenhris [name], PharmD, BCACP, CDCES, CPP [address], [address] [place], [place], [address] Phone: [phone], Fax: [phone]

## 2021-10-10 NOTE — Progress Notes (Unsigned)
Patient ID: Joy [name]                 DOB: 1941-06-18                      MRN: [id number]     HPI: Joy [name] is a 82 y.o. female referred by Dr. Oval Linsey to HTN clinic. PMH is significant for HLD, COPD, preDM, and RA. At last visit with PharmD, amlodipine 59m was started.  Patient presents today in good spirits. Slight confusion regarding her medications. Having issues breathing today but reports she did not take her Spiriva this morning.  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 However her copay is expensive and she has not been able to start yet.  Patient reports she had an "episode" over the weekend. Took blood pressure and believes her systolic readings were 89 and 90.  Fifteen minutes later it increased to 121.  Did not bring log or machine with her.   LDL also very elevated. However patient just picked up rosuvastatin 232mabout 10 days ago.  Current HTN meds:  Irbesartan 30055maily Amlodipine 5mg77mP goal: <130/80   Wt Readings from Last 3 Encounters:  10/02/21 192 lb (87.1 kg)  09/15/21 193 lb (87.5 kg)  08/31/21 190 lb 6.4 oz (86.4 kg)   BP Readings from Last 3 Encounters:  10/02/21 130/80  09/15/21 (!) 168/107  08/31/21 (!) 146/80   Pulse Readings from Last 3 Encounters:  10/02/21 83  09/15/21 72  08/31/21 70    Renal function: CrCl cannot be calculated (Patient's most recent lab result is older than the maximum 21 days allowed.).  Past Medical History:  Diagnosis Date   Ankylosing spondylitis (HCC)    Anxiety    Arthritis    RHEUMATOID   Back pain    Bronchitis    Constipation    COPD (chronic obstructive pulmonary disease) (HCC)[place] CXR 01/11/18 showed mild COPD and chronic bronchitis   CTS (carpal tunnel syndrome)    Dry eye    Dry mouth    Dysrhythmia    "irregularity" unknown at this time - being evaluated by cardiology   Essential hypertension 11/24/2015   GERD  (gastroesophageal reflux disease)    HLA B27 (HLA B27 positive)    Hypercholesteremia    Hyperlipidemia 11/24/2015   Hypertension    IBS (irritable bowel syndrome)    Joint pain    Neuropathy    OAB (overactive bladder)    Obesity    Osteoarthritis    Osteoporosis    Pre-diabetes    Rheumatoid arthritis (HCC)[place]6/2017   Sciatica    Seasonal allergies    Spinal stenosis     Current Outpatient Medications on File Prior to Visit  Medication Sig Dispense Refill   albuterol (VENTOLIN HFA) 108 (90 Base) MCG/ACT inhaler Inhale 2 puffs into the lungs every 6 (six) hours as needed for wheezing or shortness of breath. 8 g 0   Biotin 1000 MCG tablet Take 1 tablet (1 mg total) by mouth daily. 30 tablet 0   Cholecalciferol (VITAMIN D3) 25 MCG (1000 UT) CAPS Take 1 capsule (1,000 Units total) by mouth daily. 30 capsule 0   ciprofloxacin (CIPRO) 500 MG tablet Take 1 tablet (500 mg total) by mouth 2 (two) times daily. 42 tablet 0   Dextromethorphan-guaiFENesin 10-100 MG/5ML liquid Take 5 mLs by mouth every 12 (twelve)  hours. 258 mL 1   folic acid (FOLVITE) 1 MG tablet Take 1 tablet (1 mg total) by mouth daily. 30 tablet 0   gabapentin (NEURONTIN) 300 MG capsule Take 300 mg by mouth at bedtime.  1   ID NOW COVID-19 KIT See admin instructions. for testing     inFLIXimab (REMICADE IV) Inject 100 mg into the vein every 6 (six) weeks.     irbesartan (AVAPRO) 300 MG tablet Take 1 tablet (300 mg total) by mouth daily. 30 tablet 0   Magnesium 250 MG TABS Take 1 tablet (250 mg total) by mouth daily. 30 tablet 0   Methotrexate Sodium (METHOTREXATE, PF,) 50 MG/2ML injection INJECT 1 ML ONCE WEEKLY. (25 mg)  DISCARD VIAL AFTER USE     naproxen sodium (ALEVE) 220 MG tablet Take 220 mg by mouth 2 (two) times daily as needed (pain).     Polyethyl Glycol-Propyl Glycol (SYSTANE OP) Systane (PF)  prn     Probiotic Product (PROBIOTIC DAILY PO) Take by mouth.     Propylene Glycol (SYSTANE BALANCE) 0.6 % SOLN Place 1  drop into both eyes 2 (two) times daily as needed (dry eyes).     rosuvastatin (CRESTOR) 20 MG tablet Take 20 mg by mouth at bedtime.     sertraline (ZOLOFT) 25 MG tablet Take 50 mg by mouth daily.      Tiotropium Bromide Monohydrate (SPIRIVA RESPIMAT) 2.5 MCG/ACT AERS Inhale 2 puffs into the lungs daily. 4 g 11   tobramycin, PF, (TOBI) 300 MG/5ML nebulizer solution Take 5 mLs (300 mg total) by nebulization 2 (two) times daily. 280 mL 0   VITAMIN A PO Take 2,400 mcg by mouth daily.      vitamin B-12 (CYANOCOBALAMIN) 1000 MCG tablet Take 1 tablet (1,000 mcg total) by mouth daily. 30 tablet 0   No current facility-administered medications on file prior to visit.    Allergies  Allergen Reactions   Codeine Other (See Comments)    Lump in throat   Tape Hives and Rash    Paper tape only   Latex Rash   Nickel Rash    Bumps Also other metals     Assessment/Plan:  1. Hypertension -  Patient BP in room today 90/58 which is well below goal of <130/80. [place] patient is asymptomatic however sounds like she may have had hypotensive episode last weekend.  Will decrease amlodipine to 2.68m daily and continue irbesartan 3042mdaily.  Recheck in clinic in 4 weeks  Continue irbesartan 30081maily Reduce amlodipine to 2.5mg2mily Continue to monitor BP at home Recheck in 4 weeks  ChriKarren CobblearmD, BCACIndian WellsCEWest OrangeP [place], [id number]: [phone]: [phone]

## 2021-10-24 NOTE — Telephone Encounter (Signed)
See 2/10 phone note  ?

## 2021-10-25 DIAGNOSIS — M461 Sacroiliitis, not elsewhere classified: Secondary | ICD-10-CM | POA: Diagnosis not present

## 2021-10-27 ENCOUNTER — Other Ambulatory Visit: Payer: Self-pay

## 2021-10-27 ENCOUNTER — Encounter: Payer: Self-pay | Admitting: Internal Medicine

## 2021-10-27 ENCOUNTER — Ambulatory Visit (INDEPENDENT_AMBULATORY_CARE_PROVIDER_SITE_OTHER): Payer: Medicare Other | Admitting: Internal Medicine

## 2021-10-27 VITALS — BP 138/87 | HR 68 | Temp 98.3°F | Ht 62.0 in | Wt 191.0 lb

## 2021-10-27 DIAGNOSIS — B965 Pseudomonas (aeruginosa) (mallei) (pseudomallei) as the cause of diseases classified elsewhere: Secondary | ICD-10-CM

## 2021-10-27 DIAGNOSIS — J42 Unspecified chronic bronchitis: Secondary | ICD-10-CM

## 2021-10-27 DIAGNOSIS — J988 Other specified respiratory disorders: Secondary | ICD-10-CM | POA: Diagnosis not present

## 2021-10-27 DIAGNOSIS — J411 Mucopurulent chronic bronchitis: Secondary | ICD-10-CM | POA: Diagnosis not present

## 2021-10-27 DIAGNOSIS — Z5181 Encounter for therapeutic drug level monitoring: Secondary | ICD-10-CM

## 2021-10-27 LAB — BASIC METABOLIC PANEL
BUN: 19 mg/dL (ref 7–25)
CO2: 27 mmol/L (ref 20–32)
Calcium: 9.7 mg/dL (ref 8.6–10.4)
Chloride: 106 mmol/L (ref 98–110)
Creat: 0.9 mg/dL (ref 0.60–0.95)
Glucose, Bld: 87 mg/dL (ref 65–99)
Potassium: 4.2 mmol/L (ref 3.5–5.3)
Sodium: 138 mmol/L (ref 135–146)

## 2021-10-27 NOTE — Progress Notes (Signed)
? ?  Subjective:  ? ? Patient ID: Joy [name], female    DOB: 01-18-1941, 81 y.o.   MRN: [id number] ? ?HPI ?Here for follow up of Pseudomonas infection with colonization. ?I saw her in January for recurrent Pseudomonas sputum cultures after treatment with ciprofloxacin and she had no improvement.  I had her repeat a sputum culture which again grew out Pseudomonas though recent CXR did not show any acute disease.  She unfortunately was unable to get inhaled tobramycin due to cost issues and so I had her restart ciprofloxacin since it was sensitive still.  She though had not previously seemed to benefit from cipro treatment.  She does note some improvement with her cough and congestion.   ? ? ?Review of Systems  ?Constitutional:  Negative for chills and fever.  ?Gastrointestinal:  Negative for diarrhea and nausea.  ?Skin:  Negative for rash.  ? ?   ?Objective:  ? Physical Exam ?Pulmonary:  ?   Effort: Pulmonary effort is normal. No respiratory distress.  ?   Breath sounds: No wheezing.  ?Neurological:  ?   Mental Status: She is alert.  ? ? ?SH: no tobacco, remote smoking history ? ? ?   ?Assessment & Plan:  ? ? ?

## 2021-10-27 NOTE — Assessment & Plan Note (Signed)
She has a Pseudomonal airway infection but no pneumonia noted on the CXR and no fever.  This has been an ongoing issue with the Pseudomonas and little improvement with the cipro.  I discussed the use of inhaled tobramycin and possible benefit of one month of the inhaled treatment to decolonize the Pseudomonas.  She will look at trying to gather the money for a one month copay.   ?Otherwise, she will follow up again with Dr. Everardo AllEllison ?

## 2021-10-27 NOTE — Assessment & Plan Note (Signed)
She has ongoing cough and congestion and no significant improvement on cipro, though some mild change.  She will continue to follow with pulmonary for her chronic cough.  ?

## 2021-10-27 NOTE — Assessment & Plan Note (Signed)
Will check a BMP today on the cipro to be sure no renal/K issues.   ?

## 2021-11-02 DIAGNOSIS — Z1589 Genetic susceptibility to other disease: Secondary | ICD-10-CM | POA: Diagnosis not present

## 2021-11-02 DIAGNOSIS — E669 Obesity, unspecified: Secondary | ICD-10-CM | POA: Diagnosis not present

## 2021-11-02 DIAGNOSIS — M5432 Sciatica, left side: Secondary | ICD-10-CM | POA: Diagnosis not present

## 2021-11-02 DIAGNOSIS — Z6836 Body mass index (BMI) 36.0-36.9, adult: Secondary | ICD-10-CM | POA: Diagnosis not present

## 2021-11-02 DIAGNOSIS — Z79899 Other long term (current) drug therapy: Secondary | ICD-10-CM | POA: Diagnosis not present

## 2021-11-02 DIAGNOSIS — M1991 Primary osteoarthritis, unspecified site: Secondary | ICD-10-CM | POA: Diagnosis not present

## 2021-11-02 DIAGNOSIS — M0579 Rheumatoid arthritis with rheumatoid factor of multiple sites without organ or systems involvement: Secondary | ICD-10-CM | POA: Diagnosis not present

## 2021-11-07 ENCOUNTER — Ambulatory Visit: Payer: Medicare Other

## 2021-11-08 DIAGNOSIS — Z20822 Contact with and (suspected) exposure to covid-19: Secondary | ICD-10-CM | POA: Diagnosis not present

## 2021-11-13 DIAGNOSIS — M0589 Other rheumatoid arthritis with rheumatoid factor of multiple sites: Secondary | ICD-10-CM | POA: Diagnosis not present

## 2021-11-13 DIAGNOSIS — R5383 Other fatigue: Secondary | ICD-10-CM | POA: Diagnosis not present

## 2021-11-13 DIAGNOSIS — Z111 Encounter for screening for respiratory tuberculosis: Secondary | ICD-10-CM | POA: Diagnosis not present

## 2021-11-13 DIAGNOSIS — Z79899 Other long term (current) drug therapy: Secondary | ICD-10-CM | POA: Diagnosis not present

## 2021-12-04 DIAGNOSIS — F419 Anxiety disorder, unspecified: Secondary | ICD-10-CM | POA: Diagnosis not present

## 2021-12-04 DIAGNOSIS — Z0000 Encounter for general adult medical examination without abnormal findings: Secondary | ICD-10-CM | POA: Diagnosis not present

## 2021-12-04 DIAGNOSIS — G629 Polyneuropathy, unspecified: Secondary | ICD-10-CM | POA: Diagnosis not present

## 2021-12-04 DIAGNOSIS — Z1389 Encounter for screening for other disorder: Secondary | ICD-10-CM | POA: Diagnosis not present

## 2021-12-04 DIAGNOSIS — E559 Vitamin D deficiency, unspecified: Secondary | ICD-10-CM | POA: Diagnosis not present

## 2021-12-04 DIAGNOSIS — I10 Essential (primary) hypertension: Secondary | ICD-10-CM | POA: Diagnosis not present

## 2021-12-04 DIAGNOSIS — E669 Obesity, unspecified: Secondary | ICD-10-CM | POA: Diagnosis not present

## 2021-12-04 DIAGNOSIS — E7800 Pure hypercholesterolemia, unspecified: Secondary | ICD-10-CM | POA: Diagnosis not present

## 2021-12-04 DIAGNOSIS — K219 Gastro-esophageal reflux disease without esophagitis: Secondary | ICD-10-CM | POA: Diagnosis not present

## 2021-12-04 DIAGNOSIS — M810 Age-related osteoporosis without current pathological fracture: Secondary | ICD-10-CM | POA: Diagnosis not present

## 2021-12-04 DIAGNOSIS — M069 Rheumatoid arthritis, unspecified: Secondary | ICD-10-CM | POA: Diagnosis not present

## 2021-12-15 ENCOUNTER — Ambulatory Visit (INDEPENDENT_AMBULATORY_CARE_PROVIDER_SITE_OTHER): Payer: Medicare Other | Admitting: Pulmonary Disease

## 2021-12-15 ENCOUNTER — Encounter: Payer: Self-pay | Admitting: Pulmonary Disease

## 2021-12-15 VITALS — BP 122/64 | HR 72 | Ht 62.0 in | Wt 194.0 lb

## 2021-12-15 DIAGNOSIS — J42 Unspecified chronic bronchitis: Secondary | ICD-10-CM | POA: Diagnosis not present

## 2021-12-15 DIAGNOSIS — R911 Solitary pulmonary nodule: Secondary | ICD-10-CM | POA: Diagnosis not present

## 2021-12-15 DIAGNOSIS — Z2239 Carrier of other specified bacterial diseases: Secondary | ICD-10-CM | POA: Diagnosis not present

## 2021-12-15 MED ORDER — ANORO ELLIPTA 62.5-25 MCG/ACT IN AEPB: 1.0000 | INHALATION_SPRAY | Freq: Every day | RESPIRATORY_TRACT | 5 refills | Status: DC

## 2021-12-15 NOTE — Progress Notes (Signed)
? ?Synopsis: Referred in 05/2018 for hx of chronic bronchitis x 2 years.  ? ?Subjective:  ? ?PATIENT ID: Joy [name] GENDER: female DOB: 24-Jan-1941, MRN: [id number] ? ? ?HPI ? ?Chief Complaint  ?Patient presents with  ? Follow-up  ?  Cough not getting better  ? ?[name] is an 81 year old female remote smoker with RA on methotrexate and infliximab who presents for follow-up ? ?Synopsis: ?2019 - Established [facility] for longstanding history of bronchitis. Started on Spiriva ?2020 - Improved symptoms on Spiriva however discontinued due to cost.  ?2022 - Restarted on Spiriva. August - Augmentin. September - steroids. November +pseudomonas treated x 3 weeks with persistent symptoms ? ?10/02/21 ?She was seen by ID on 09/15/21 with Dr. Linus [name]. CXR repeated with no active disease however given Pseudomonas colonization, inhaled tobramycin ordered. Has not picked it up yet and will need a nebulizer. She has remained compliant with Spiriva. She continues to have productive cough with brown sputum that is unchanged. Taking mucinex once a day. Denies shortness of breath or wheezing. Rarely uses rescue inhaler. ? ?12/15/21 ?She presents for acute follow-up. Known pseudomonas colonization. She was seen by ID last month and unfortunately unable to start inhaled tobra due to financial limitations. Cultures remained sensitive to cipro so ID prescribed PO antibiotics on 10/27/21. She continues to have productive cough. Not as thick as before but coughing spells up to 40 min. Worse at night and causes her to feel fatigued. Denies shortness of breath or wheezing. ? ?Social History: Quit smoking in 1995. Smoked for 30 years x 1ppd.  ? ?Past Medical History:  ?Diagnosis Date  ? Ankylosing spondylitis ([place])   ? Anxiety   ? Arthritis   ? RHEUMATOID  ? Back pain   ? Bronchitis   ? Constipation   ? COPD (chronic obstructive pulmonary disease) ([place])   ? CXR 01/11/18 showed mild COPD and chronic bronchitis  ? CTS (carpal  tunnel syndrome)   ? Dry eye   ? Dry mouth   ? Dysrhythmia   ? "irregularity" unknown at this time - being evaluated by cardiology  ? Essential hypertension 11/24/2015  ? GERD (gastroesophageal reflux disease)   ? HLA B27 (HLA B27 positive)   ? Hypercholesteremia   ? Hyperlipidemia 11/24/2015  ? Hypertension   ? IBS (irritable bowel syndrome)   ? Joint pain   ? Neuropathy   ? OAB (overactive bladder)   ? Obesity   ? Osteoarthritis   ? Osteoporosis   ? Pre-diabetes   ? Rheumatoid arthritis ([place]) 11/24/2015  ? Sciatica   ? Seasonal allergies   ? Spinal stenosis   ?  ?Allergies  ?Allergen Reactions  ? Codeine Other (See Comments)  ?  Lump in throat  ? Tape Hives and Rash  ?  Paper tape only  ? Latex Rash  ? Nickel Rash  ?  Bumps ?Also other metals  ?  ? ?Outpatient Medications Prior to Visit  ?Medication Sig Dispense Refill  ? amLODipine (NORVASC) 2.5 MG tablet Take 1 tablet (2.5 mg total) by mouth daily. 90 tablet 1  ? Biotin 1000 MCG tablet Take 1 tablet (1 mg total) by mouth daily. 30 tablet 0  ? Cholecalciferol (VITAMIN D3) 25 MCG (1000 UT) CAPS Take 1 capsule (1,000 Units total) by mouth daily. 30 capsule 0  ? ciprofloxacin (CIPRO) 500 MG tablet Take 500 mg by mouth 2 (two) times daily.    ? folic acid (FOLVITE) 1 MG tablet  Take 1 tablet (1 mg total) by mouth daily. 30 tablet 0  ? gabapentin (NEURONTIN) 300 MG capsule Take 300 mg by mouth at bedtime.  1  ? ID NOW COVID-19 KIT See admin instructions. for testing    ? inFLIXimab (REMICADE IV) Inject 100 mg into the vein every 6 (six) weeks.    ? irbesartan (AVAPRO) 300 MG tablet Take 1 tablet (300 mg total) by mouth daily. 30 tablet 0  ? Magnesium 250 MG TABS Take 1 tablet (250 mg total) by mouth daily. 30 tablet 0  ? Methotrexate Sodium (METHOTREXATE, PF,) 50 MG/2ML injection INJECT 1 ML ONCE WEEKLY. (25 mg)  DISCARD VIAL AFTER USE    ? pantoprazole (PROTONIX) 40 MG tablet Take 40 mg by mouth daily.    ? Probiotic Product (PROBIOTIC DAILY PO) Take by mouth.    ?  Propylene Glycol (SYSTANE BALANCE) 0.6 % SOLN Place 1 drop into both eyes 2 (two) times daily as needed (dry eyes).    ? rosuvastatin (CRESTOR) 20 MG tablet Take 20 mg by mouth at bedtime.    ? sertraline (ZOLOFT) 25 MG tablet Take 50 mg by mouth daily.     ? VITAMIN A PO Take 2,400 mcg by mouth daily.     ? vitamin B-12 (CYANOCOBALAMIN) 1000 MCG tablet Take 1 tablet (1,000 mcg total) by mouth daily. 30 tablet 0  ? Tiotropium Bromide Monohydrate (SPIRIVA RESPIMAT) 2.5 MCG/ACT AERS Inhale 2 puffs into the lungs daily. 4 g 11  ? tobramycin, PF, (TOBI) 300 MG/5ML nebulizer solution Take 5 mLs (300 mg total) by nebulization 2 (two) times daily. (Patient not taking: Reported on 10/27/2021) 280 mL 0  ? ?No facility-administered medications prior to visit.  ? ? ?Review of Systems  ?Constitutional:  Negative for chills, diaphoresis, fever, malaise/fatigue and weight loss.  ?HENT:  Negative for congestion.   ?Respiratory:  Positive for cough. Negative for hemoptysis, sputum production, shortness of breath and wheezing.   ?Cardiovascular:  Negative for chest pain, palpitations and leg swelling.  ? ?Objective:  ? ?Vitals:  ? 12/15/21 0912  ?BP: 122/64  ?Pulse: 72  ?SpO2: 99%  ?Weight: 194 lb (88 kg)  ?Height: _0  (1.575 m)  ? ?Physical Exam: ?General: Well-appearing, no acute distress ?HENT: [place], AT ?Eyes: EOMI, no scleral icterus ?Respiratory: Clear to auscultation bilaterally.  No crackles, wheezing or rales ?Cardiovascular: RRR, -M/R/G, no JVD ?Extremities:-Edema,-tenderness ?Neuro: AAO x4, CNII-XII grossly intact ?Psych: Normal mood, normal affect ? ?Chest imaging: ?CXR 01/11/18 - No pulmonary edema, effusion or infiltrate ?CT HR 07/28/21 - RLL nodule 1.4 x 1 and RUL ground glas nodule. Otherwise normal parenchyma. Moderate/large hiatal hernia. 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 Unchanged coarse interstitial markings suggestive of chronic  bronchitis ? ?PFT:  ?08/21/18  ?FVC 2.4 (154%) FEV1 2.21 (156%) Ratio 74 TLC 104% DLCO corrected 78% ?Interpretation: Normal spirometry and lung volumes with mildly reduced DLCO ? ?Mild obstructive defect present with mildly reduced DLCO. TLC normal. No significant bronchodilator effect present however does not preclude benefit of bronchodilator therapy. ? ?Labs: ?Sputum 07/10/2021 Pseudomonas pansensitive except for intermediate Zosyn ?Sputum 09/15/2021 Pseudomonas pansensitive except for intermediate Zosyn ?   ?Assessment & Plan:  ?81 year old female with RA on methotrexate, OA, HTN who presents for follow-up of pseudomonas colonization and chronic cough. Persistent cough likely related to untreated PA infection. Will step-up bronchodilators from LAMA to LAMA/LABA for symptom management. She reports using Fabulosa for at  least 4+ years, which is on recall.  ? 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 Unable to receive treatment for inhaled tobramycin ?--Discussed pricing and she believes $338 is reasonable and will check with her pharmacy ? ?Chronic bronchitis ?--START Anoro ONE puff ONCE a day. Will provide samples if available ?--CONTINUE Albuterol every 4 hours as needed for shortness of breath or wheezing. REFILL ?--Continue Protonix 40 mg daily in setting of known hiatal hernia ?--Continue Robitussin as needed ? ?RLL lung nodularity ?--ORDER CT Chest without contrast ? ?Immunization History  ?Administered Date(s) Administered  ? Fluad Quad(high Dose 65+) 04/21/2019, 05/20/2021  ? Influenza Split 05/22/2012, 04/16/2014  ? Influenza, High Dose Seasonal PF 05/20/2018, 05/24/2020  ? Influenza-Unspecified 04/26/2015, 06/16/2019  ? PFIZER(Purple Top)SARS-COV-2 Vaccination 09/11/2019, 09/25/2019, 05/14/2020, 11/25/2020  ? Pension scheme manager 66yr & up 05/08/2021  ? Pneumococcal Conjugate-13 04/16/2014  ? Pneumococcal Polysaccharide-23 04/26/2015  ? Tdap 10/19/2014   ? ?Meds ordered this encounter  ?Medications  ? umeclidinium-vilanterol (ANORO ELLIPTA) 62.5-25 MCG/ACT AEPB  ?  Sig: Inhale 1 puff into the lungs daily.  ?  Dispense:  60 each  ?  Refill:  5  ? ?Orders Placed This Encounter

## 2021-12-15 NOTE — Patient Instructions (Addendum)
Pseudomonas colonization  ?--Followed by ID. Unable to receive treatment for inhaled tobramycin ?--Discussed pricing and she believes $338 is reasonable and will check with her pharmacy ? ?Chronic bronchitis ?--START Anoro ONE puff ONCE a day. Will provide samples if available ?--CONTINUE Albuterol every 4 hours as needed for shortness of breath or wheezing. REFILL ?--Continue Protonix 40 mg daily in setting of known hiatal hernia ?--Continue Robitussin as needed ? ?RLL lung nodularity ?--ORDER CT Chest without contrast (end of May) ? ?Follow-up with me in 6 weeks (beginning of June) after the CT scan  ?

## 2021-12-21 ENCOUNTER — Ambulatory Visit
Admission: RE | Admit: 2021-12-21 | Discharge: 2021-12-21 | Disposition: A | Discharge status: Home / Self Care | Payer: Medicare Other | Source: Ambulatory Visit | Attending: Pulmonary Disease | Admitting: Pulmonary Disease

## 2021-12-21 ENCOUNTER — Telehealth: Payer: Self-pay

## 2021-12-21 DIAGNOSIS — Z20822 Contact with and (suspected) exposure to covid-19: Secondary | ICD-10-CM | POA: Diagnosis not present

## 2021-12-21 DIAGNOSIS — R911 Solitary pulmonary nodule: Secondary | ICD-10-CM | POA: Diagnosis not present

## 2021-12-21 NOTE — Telephone Encounter (Signed)
Patient called office today regarding medication question. States that she will be is scheduled for infusion Remicade on Monday 5/8. Is still taking Tobramycin. Would like to know if Dr. Luciana Axe would like her to hold off on the infusion until she completes tobramycin or if she could with infusion as scheduled.  ?Juanita Laster, RMA  ?

## 2021-12-22 ENCOUNTER — Telehealth: Payer: Self-pay | Admitting: Pulmonary Disease

## 2021-12-22 DIAGNOSIS — Z20822 Contact with and (suspected) exposure to covid-19: Secondary | ICD-10-CM | POA: Diagnosis not present

## 2021-12-22 NOTE — Telephone Encounter (Signed)
[facility] Telephone Encounter ? ?I have updated patient on CT result which demonstrate stable/improved nodules/GGO included decreased RLL nodule from 1.3>56mm. 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 She has picked up her inhaled Tobramycin but has held off on taking it due to her Rheumatology recommendations. ? ?I personally contacted [facility] and spoke with Dr. Amil Amen who is covering for his partner Dr. Trudie Reed. He reports he spoke with Joy [name] early this week and had agreed it was safe for her to start inhaled Tobramycin while receiving her RA infusions. ? ?I returned phone call to Joy [name] and restated the above recommendations from the Rheumatology office. She expressed understanding and agreed to plan. ? ?Plan ?Start inhaled Tobramycin as directed by ID ?

## 2021-12-22 NOTE — Telephone Encounter (Signed)
Called patient with providers response. ?Joy [name], RMA  ?

## 2021-12-25 DIAGNOSIS — M0579 Rheumatoid arthritis with rheumatoid factor of multiple sites without organ or systems involvement: Secondary | ICD-10-CM | POA: Diagnosis not present

## 2022-01-04 DIAGNOSIS — H43813 Vitreous degeneration, bilateral: Secondary | ICD-10-CM | POA: Diagnosis not present

## 2022-01-04 DIAGNOSIS — H5713 Ocular pain, bilateral: Secondary | ICD-10-CM | POA: Diagnosis not present

## 2022-01-04 DIAGNOSIS — H16223 Keratoconjunctivitis sicca, not specified as Sjogren's, bilateral: Secondary | ICD-10-CM | POA: Diagnosis not present

## 2022-01-12 ENCOUNTER — Other Ambulatory Visit: Payer: Self-pay | Admitting: Internal Medicine

## 2022-01-12 NOTE — Telephone Encounter (Signed)
Spoke with patient, she states she does not need a refill and requested by mistake.   Sandie Ano, RN

## 2022-01-31 ENCOUNTER — Encounter: Payer: Self-pay | Admitting: Pulmonary Disease

## 2022-01-31 ENCOUNTER — Ambulatory Visit (INDEPENDENT_AMBULATORY_CARE_PROVIDER_SITE_OTHER): Payer: Medicare Other | Admitting: Pulmonary Disease

## 2022-01-31 VITALS — BP 118/72 | HR 78 | Temp 98.1°F | Ht 62.0 in | Wt 194.8 lb

## 2022-01-31 DIAGNOSIS — R911 Solitary pulmonary nodule: Secondary | ICD-10-CM | POA: Diagnosis not present

## 2022-01-31 DIAGNOSIS — Z2239 Carrier of other specified bacterial diseases: Secondary | ICD-10-CM | POA: Diagnosis not present

## 2022-01-31 DIAGNOSIS — Z1231 Encounter for screening mammogram for malignant neoplasm of breast: Secondary | ICD-10-CM | POA: Diagnosis not present

## 2022-01-31 MED ORDER — CEPHALEXIN 500 MG PO CAPS: 500.0000 mg | ORAL_CAPSULE | Freq: Four times a day (QID) | ORAL | 0 refills | Status: AC

## 2022-01-31 MED ORDER — HYDROCODONE BIT-HOMATROP MBR 5-1.5 MG/5ML PO SOLN: 5.0000 mL | Freq: Four times a day (QID) | ORAL | 0 refills | Status: DC | PRN

## 2022-01-31 MED ORDER — ALBUTEROL SULFATE HFA 108 (90 BASE) MCG/ACT IN AERS: 2.0000 | INHALATION_SPRAY | RESPIRATORY_TRACT | 6 refills | Status: DC | PRN

## 2022-01-31 MED ORDER — FLUTICASONE FUROATE-VILANTEROL 200-25 MCG/ACT IN AEPB: 1.0000 | INHALATION_SPRAY | Freq: Every day | RESPIRATORY_TRACT | 0 refills | Status: DC

## 2022-01-31 NOTE — Patient Instructions (Signed)
Acute on Chronic bronchitis --START Keflex for 7 days --HOLD on Anoro --START Breo ONE puff ONCE a day x 1 month --CONTINUE Albuterol every 4 hours as needed for shortness of breath or wheezing. REFILL --Continue Protonix 40 mg daily in setting of known hiatal hernia --ORDER codeine cough syrup  RLL lung nodularity- improving from 14 mm>13mm --ORDER CT Chest without contrast in 6 months (07/2022)  Follow-up with me in 6 months (December after CT scan)

## 2022-01-31 NOTE — Progress Notes (Signed)
Synopsis: Referred in 05/2018 for hx of chronic bronchitis x 2 years.   Subjective:   PATIENT ID: Joy [name] GENDER: female DOB: May 17, 1941, MRN: [id number]   HPI  Chief Complaint  Patient presents with   Acute Visit    Pt states that she has been having complaints of wheezing, coughing, and increased SOB. States she is coughing up a lot of phlegm that ranges in color from brown-green.   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 Started on Spiriva 2020 - Improved symptoms on Spiriva however discontinued due to cost.  2022 - Restarted on Spiriva. August - Augmentin. September - steroids. November +pseudomonas treated x 3 weeks with persistent symptoms  10/02/21 She was seen by ID on 09/15/21 with Dr. Linus Salmons. CXR repeated with no active disease however given Pseudomonas colonization, inhaled tobramycin ordered. Has not picked it up yet and will need a nebulizer. She has remained compliant with Spiriva. She continues to have productive cough with brown sputum that is unchanged. Taking mucinex once a day. Denies shortness of breath or wheezing. Rarely uses rescue inhaler.  12/15/21 She presents for acute follow-up. Known pseudomonas colonization. She was seen by ID last month and unfortunately unable to start inhaled tobra due to financial limitations. Cultures remained sensitive to cipro so ID prescribed PO antibiotics on 10/27/21. She continues to have productive cough. Not as thick as before but coughing spells up to 40 min. Worse at night and causes her to feel fatigued. Denies shortness of breath or wheezing.  01/31/22 Since our last visit she was advised to start inhaled tobramycin. Started using mouthpiece and switched to facemask. She does not feel like it is effective. Compliant with her Anoro. Her cough remains  deep and hard to expel sputum in the last few days. Increased wheezing and shortness of breath. Denies fevers, chills. She is having chronic back pain and may need surgery later this year.   Social History: Quit smoking in 1995. Smoked for 30 years x 1ppd.   Past Medical History:  Diagnosis Date   Ankylosing spondylitis (HCC)    Anxiety    Arthritis    RHEUMATOID   Back pain    Bronchitis    Constipation    COPD (chronic obstructive pulmonary disease) ([place])    CXR 01/11/18 showed mild COPD and chronic bronchitis   CTS (carpal tunnel syndrome)    Dry eye    Dry mouth    Dysrhythmia    "irregularity" unknown at this time - being evaluated by cardiology   Essential hypertension 11/24/2015   GERD (gastroesophageal reflux disease)    HLA B27 (HLA B27 positive)    Hypercholesteremia    Hyperlipidemia 11/24/2015   Hypertension    IBS (irritable bowel syndrome)    Joint pain    Neuropathy    OAB (overactive bladder)    Obesity    Osteoarthritis    Osteoporosis    Pre-diabetes    Rheumatoid arthritis ([place]) 11/24/2015   Sciatica    Seasonal allergies    Spinal stenosis     Allergies  Allergen Reactions   Codeine Other (See Comments)    Lump in throat   Tape Hives and Rash    Paper tape only   Latex Rash   Nickel Rash    Bumps Also other metals  Outpatient Medications Prior to Visit  Medication Sig Dispense Refill   amLODipine (NORVASC) 2.5 MG tablet Take 1 tablet (2.5 mg total) by mouth daily. 90 tablet 1   Biotin 1000 MCG tablet Take 1 tablet (1 mg total) by mouth daily. 30 tablet 0   Cholecalciferol (VITAMIN D3) 25 MCG (1000 UT) CAPS Take 1 capsule (1,000 Units total) by mouth daily. 30 capsule 0   folic acid (FOLVITE) 1 MG tablet Take 1 tablet (1 mg total) by mouth daily. 30 tablet 0   gabapentin (NEURONTIN) 300 MG capsule Take 300 mg by mouth at bedtime.  1   ID NOW COVID-19 KIT See admin instructions. for testing     inFLIXimab (REMICADE IV) Inject 100 mg into the  vein every 6 (six) weeks.     irbesartan (AVAPRO) 300 MG tablet Take 1 tablet (300 mg total) by mouth daily. 30 tablet 0   Magnesium 250 MG TABS Take 1 tablet (250 mg total) by mouth daily. 30 tablet 0   Methotrexate Sodium (METHOTREXATE, PF,) 50 MG/2ML injection INJECT 1 ML ONCE WEEKLY. (25 mg)  DISCARD VIAL AFTER USE     pantoprazole (PROTONIX) 40 MG tablet Take 40 mg by mouth daily.     Probiotic Product (PROBIOTIC DAILY PO) Take by mouth.     Propylene Glycol (SYSTANE BALANCE) 0.6 % SOLN Place 1 drop into both eyes 2 (two) times daily as needed (dry eyes).     rosuvastatin (CRESTOR) 20 MG tablet Take 20 mg by mouth at bedtime.     sertraline (ZOLOFT) 25 MG tablet Take 50 mg by mouth daily.      tobramycin, PF, (TOBI) 300 MG/5ML nebulizer solution Take 5 mLs (300 mg total) by nebulization 2 (two) times daily. 280 mL 0   umeclidinium-vilanterol (ANORO ELLIPTA) 62.5-25 MCG/ACT AEPB Inhale 1 puff into the lungs daily. 60 each 5   VITAMIN A PO Take 2,400 mcg by mouth daily.      vitamin B-12 (CYANOCOBALAMIN) 1000 MCG tablet Take 1 tablet (1,000 mcg total) by mouth daily. 30 tablet 0   ciprofloxacin (CIPRO) 500 MG tablet Take 500 mg by mouth 2 (two) times daily.     No facility-administered medications prior to visit.    Review of Systems  Constitutional:  Negative for chills, diaphoresis, fever, malaise/fatigue and weight loss.  HENT:  Negative for congestion.   Respiratory:  Positive for cough, sputum production, shortness of breath and wheezing. Negative for hemoptysis.   Cardiovascular:  Negative for chest pain, palpitations and leg swelling.    Objective:   Vitals:   01/31/22 1009  BP: 118/72  Pulse: 78  Temp: 98.1 F (36.7 C)  TempSrc: Oral  SpO2: 99%  Weight: 194 lb 12.8 oz (88.4 kg)  Height: $Remove'5\' 2"'qCXxrad$  (1.575 m)   Physical Exam: General: Well-appearing, no acute distress HENT: [place], AT Eyes: EOMI, no scleral icterus Respiratory: Clear to auscultation bilaterally.  No  crackles, wheezing or rales Cardiovascular: RRR, -M/R/G, no JVD Extremities:-Edema,-tenderness Neuro: AAO x4, CNII-XII grossly intact Psych: Normal mood, normal affect  Chest imaging: CXR 01/11/18 - No pulmonary edema, effusion or infiltrate CT HR 07/28/21 - RLL nodule 1.4 x 1 and RUL ground glas nodule. Otherwise normal parenchyma. Moderate/large hiatal hernia. 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 Unchanged coarse interstitial markings suggestive of chronic bronchitis CT Chest 12/21/21 - Right lower lobe nodule decreased from 83mm to 9 mm. Stable GGO.  PFT:  08/21/18  FVC  2.4 (154%) FEV1 2.21 (156%) Ratio 74 TLC 104% DLCO corrected 78% Interpretation: Normal spirometry and lung volumes with mildly reduced DLCO  Mild obstructive defect present with mildly reduced DLCO. TLC normal. No significant bronchodilator effect present however does not preclude benefit of bronchodilator therapy.  Labs: Sputum 07/10/2021 Pseudomonas pansensitive except for intermediate Zosyn Sputum 09/15/2021 Pseudomonas pansensitive except for intermediate Zosyn    Assessment & Plan:  81 year old female with RA on methotrexate, OA, HTN who presents for follow-up for pseudomonas colonization and chronic cough. She has persistent cough that has worsened in the last 2-3 days associated with wheezing and shortness of breath. Does not feel LAMA/LABA is effective. Discussed bronchodilators and will switch to ICS/LABA.  Pseudomonas colonization - s/p treatment for pneumonia --Followed by ID. On tobramycin --Send message to Dr. Linus Salmons regarding follow-up timing  Acute on Chronic bronchitis --Unable to produce sputum sample in office --START Keflex for 7 days --HOLD on Anoro --START Breo ONE puff ONCE a day x 1 month --CONTINUE Albuterol every 4 hours as needed for shortness of breath or wheezing. REFILL --Continue Protonix 40 mg daily in setting of known  hiatal hernia --ORDER codeine cough syrup  RLL lung nodularity- improving from 14 mm>89mm --ORDER CT Chest without contrast in 6 months (07/2022)  Immunization History  Administered Date(s) Administered   Fluad Quad(high Dose 65+) 04/21/2019, 05/20/2021   Influenza Split 05/22/2012, 04/16/2014   Influenza, High Dose Seasonal PF 05/20/2018, 05/24/2020   Influenza-Unspecified 04/26/2015, 06/16/2019   PFIZER(Purple Top)SARS-COV-2 Vaccination 09/11/2019, 09/25/2019, 05/14/2020, 11/25/2020   Pfizer Covid-19 Vaccine Bivalent Booster 39yrs & up 05/08/2021   Pneumococcal Conjugate-13 04/16/2014   Pneumococcal Polysaccharide-23 04/26/2015   Tdap 10/19/2014   Meds ordered this encounter  Medications   fluticasone furoate-vilanterol (BREO ELLIPTA) 200-25 MCG/ACT AEPB    Sig: Inhale 1 puff into the lungs daily.    Dispense:  60 each    Refill:  0   cephALEXin (KEFLEX) 500 MG capsule    Sig: Take 1 capsule (500 mg total) by mouth 4 (four) times daily for 7 days.    Dispense:  28 capsule    Refill:  0   albuterol (VENTOLIN HFA) 108 (90 Base) MCG/ACT inhaler    Sig: Inhale 2 puffs into the lungs every 4 (four) hours as needed for wheezing or shortness of breath.    Dispense:  8 g    Refill:  6    Please prescribe inhaler covered by pt's insurance   HYDROcodone bit-homatropine (HYCODAN) 5-1.5 MG/5ML syrup    Sig: Take 5 mLs by mouth every 6 (six) hours as needed for cough.    Dispense:  240 mL    Refill:  0   Orders Placed This Encounter  Procedures   CT Chest Wo Contrast    To be completed in 6 months prior to pt's next appt    Standing Status:   Future    Standing Expiration Date:   02/01/2023    Order Specific Question:   Preferred imaging location?    Answer:   [facility] [employer]   Return in about 6 months (around 08/02/2022).  I have spent a total time of 30-minutes on the day of the appointment including chart review, data review, collecting history, coordinating care and  discussing medical diagnosis and plan with the patient/family. Past medical history, allergies, medications were reviewed. Pertinent imaging, labs and tests included in this note have been reviewed and interpreted independently by me.  Dylan Ruotolo Opal Sidles  Loanne Drilling, MD [facility] 01/31/2022 5:51 PM

## 2022-02-05 DIAGNOSIS — M0589 Other rheumatoid arthritis with rheumatoid factor of multiple sites: Secondary | ICD-10-CM | POA: Diagnosis not present

## 2022-02-07 ENCOUNTER — Telehealth: Payer: Self-pay

## 2022-02-07 NOTE — Telephone Encounter (Signed)
Called and spoke with patient this afternoon. Explained to patient that Dr Everardo All wanted me to contact her to get her scheduled for a follow up at the end of July.   Got patient scheduled for   Friday  July 28,2023 10:45am With Dr Everardo All  Patient verbalized understanding and confirmed appointment. Nothing further needed

## 2022-02-07 NOTE — Telephone Encounter (Signed)
-----   Message from Chi Mechele Collin, MD sent at 02/07/2022  4:19 PM EDT ----- Regarding: Follow-up Please schedule follow-up with me in end of July or August

## 2022-02-19 DIAGNOSIS — R5383 Other fatigue: Secondary | ICD-10-CM | POA: Diagnosis not present

## 2022-02-19 DIAGNOSIS — Z1331 Encounter for screening for depression: Secondary | ICD-10-CM | POA: Diagnosis not present

## 2022-02-19 DIAGNOSIS — R7303 Prediabetes: Secondary | ICD-10-CM | POA: Diagnosis not present

## 2022-02-19 DIAGNOSIS — I10 Essential (primary) hypertension: Secondary | ICD-10-CM | POA: Diagnosis not present

## 2022-02-19 DIAGNOSIS — E559 Vitamin D deficiency, unspecified: Secondary | ICD-10-CM | POA: Diagnosis not present

## 2022-02-19 DIAGNOSIS — M069 Rheumatoid arthritis, unspecified: Secondary | ICD-10-CM | POA: Diagnosis not present

## 2022-02-19 DIAGNOSIS — E7800 Pure hypercholesterolemia, unspecified: Secondary | ICD-10-CM | POA: Diagnosis not present

## 2022-02-19 DIAGNOSIS — Z6835 Body mass index (BMI) 35.0-35.9, adult: Secondary | ICD-10-CM | POA: Diagnosis not present

## 2022-02-19 DIAGNOSIS — R0602 Shortness of breath: Secondary | ICD-10-CM | POA: Diagnosis not present

## 2022-02-27 ENCOUNTER — Other Ambulatory Visit: Payer: Self-pay | Admitting: Pulmonary Disease

## 2022-03-05 DIAGNOSIS — Z6835 Body mass index (BMI) 35.0-35.9, adult: Secondary | ICD-10-CM | POA: Diagnosis not present

## 2022-03-05 DIAGNOSIS — E669 Obesity, unspecified: Secondary | ICD-10-CM | POA: Diagnosis not present

## 2022-03-05 DIAGNOSIS — R7303 Prediabetes: Secondary | ICD-10-CM | POA: Diagnosis not present

## 2022-03-05 DIAGNOSIS — I10 Essential (primary) hypertension: Secondary | ICD-10-CM | POA: Diagnosis not present

## 2022-03-05 DIAGNOSIS — M069 Rheumatoid arthritis, unspecified: Secondary | ICD-10-CM | POA: Diagnosis not present

## 2022-03-06 DIAGNOSIS — H16223 Keratoconjunctivitis sicca, not specified as Sjogren's, bilateral: Secondary | ICD-10-CM | POA: Diagnosis not present

## 2022-03-06 DIAGNOSIS — H43813 Vitreous degeneration, bilateral: Secondary | ICD-10-CM | POA: Diagnosis not present

## 2022-03-06 DIAGNOSIS — H5713 Ocular pain, bilateral: Secondary | ICD-10-CM | POA: Diagnosis not present

## 2022-03-16 ENCOUNTER — Ambulatory Visit (INDEPENDENT_AMBULATORY_CARE_PROVIDER_SITE_OTHER): Payer: Medicare Other | Admitting: Pulmonary Disease

## 2022-03-16 ENCOUNTER — Encounter: Payer: Self-pay | Admitting: Pulmonary Disease

## 2022-03-16 VITALS — BP 126/78 | HR 62 | Ht 62.0 in | Wt 193.0 lb

## 2022-03-16 DIAGNOSIS — Z2239 Carrier of other specified bacterial diseases: Secondary | ICD-10-CM | POA: Diagnosis not present

## 2022-03-16 DIAGNOSIS — R053 Chronic cough: Secondary | ICD-10-CM | POA: Diagnosis not present

## 2022-03-16 NOTE — Patient Instructions (Addendum)
Pseudomonas colonization - s/p treatment for pneumonia Immunosuppressed patient --Completed tobramycin --ORDER sputum culture today --If persistent PA, may need to refer to ID again for extended tobra  Chronic bronchitis --ORDER sputum culture today --CONTINUE Breo ONE puff ONCE a day. If ineffective, can return to Anoro --CONTINUE Albuterol every 4 hours as needed for shortness of breath or wheezing. REFILL --Continue Protonix 40 mg daily in setting of known hiatal hernia --CONTINUE codeine cough syrup  Follow-up with me

## 2022-03-16 NOTE — Progress Notes (Signed)
Synopsis: Referred in 05/2018 for hx of chronic bronchitis x 2 years.   Subjective:   PATIENT ID: Joy [name] GENDER: female DOB: 19-Sep-1940, MRN: [id number]   HPI  Chief Complaint  Patient presents with   Follow-up   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 Started on Spiriva 2020 - Improved symptoms on Spiriva however discontinued due to cost.  2022 - Restarted on Spiriva. August - Augmentin. September - steroids. November +pseudomonas treated x 3 weeks with persistent symptoms  10/02/21 She was seen by ID on 09/15/21 with Dr. Linus Salmons. CXR repeated with no active disease however given Pseudomonas colonization, inhaled tobramycin ordered. Has not picked it up yet and will need a nebulizer. She has remained compliant with Spiriva. She continues to have productive cough with brown sputum that is unchanged. Taking mucinex once a day. Denies shortness of breath or wheezing. Rarely uses rescue inhaler.  12/15/21 She presents for acute follow-up. Known pseudomonas colonization. She was seen by ID last month and unfortunately unable to start inhaled tobra due to financial limitations. Cultures remained sensitive to cipro so ID prescribed PO antibiotics on 10/27/21. She continues to have productive cough. Not as thick as before but coughing spells up to 40 min. Worse at night and causes her to feel fatigued. Denies shortness of breath or wheezing.  01/31/22 Since our last visit she was advised to start inhaled tobramycin. Started using mouthpiece and switched to facemask. She does not feel like it is effective. Compliant with her Anoro. Her cough remains deep and hard to expel sputum in the last few days. Increased wheezing and shortness of breath. Denies fevers, chills. She is having chronic back pain and may need surgery later  this year.   03/16/22 Since our last visit she completed antibiotic. She completed tobramycin.  Initially felt her symptoms improved until this month.  She has had recurrent episodes of bronchitis including twice July 2023. She currently has dark sputum with cough. Scant hemoptysis.  Denies wheezing.  Denies fevers or chills.  Social History: Quit smoking in 1995. Smoked for 30 years x 1ppd.   Past Medical History:  Diagnosis Date   Ankylosing spondylitis (HCC)    Anxiety    Arthritis    RHEUMATOID   Back pain    Bronchitis    Constipation    COPD (chronic obstructive pulmonary disease) ([place])    CXR 01/11/18 showed mild COPD and chronic bronchitis   CTS (carpal tunnel syndrome)    Dry eye    Dry mouth    Dysrhythmia    "irregularity" unknown at this time - being evaluated by cardiology   Essential hypertension 11/24/2015   GERD (gastroesophageal reflux disease)    HLA B27 (HLA B27 positive)    Hypercholesteremia    Hyperlipidemia 11/24/2015   Hypertension    IBS (irritable bowel syndrome)    Joint pain    Neuropathy    OAB (overactive bladder)    Obesity    Osteoarthritis    Osteoporosis    Pre-diabetes    Rheumatoid arthritis ([place]) 11/24/2015   Sciatica    Seasonal allergies    Spinal stenosis     Allergies  Allergen Reactions   Codeine Other (See Comments)    Lump in throat   Tape Hives and Rash    Paper tape only  Latex Rash   Nickel Rash    Bumps Also other metals     Outpatient Medications Prior to Visit  Medication Sig Dispense Refill   albuterol (VENTOLIN HFA) 108 (90 Base) MCG/ACT inhaler Inhale 2 puffs into the lungs every 4 (four) hours as needed for wheezing or shortness of breath. 8 g 6   amLODipine (NORVASC) 2.5 MG tablet Take 1 tablet (2.5 mg total) by mouth daily. 90 tablet 1   Biotin 1000 MCG tablet Take 1 tablet (1 mg total) by mouth daily. 30 tablet 0   BREO ELLIPTA 200-25 MCG/ACT AEPB INHALE 1 PUFF INTO THE LUNGS DAILY 60 each 3    Cholecalciferol (VITAMIN D3) 25 MCG (1000 UT) CAPS Take 1 capsule (1,000 Units total) by mouth daily. 30 capsule 0   folic acid (FOLVITE) 1 MG tablet Take 1 tablet (1 mg total) by mouth daily. 30 tablet 0   gabapentin (NEURONTIN) 300 MG capsule Take 2 capsules (600 mg total) by mouth at bedtime.     HYDROcodone bit-homatropine (HYCODAN) 5-1.5 MG/5ML syrup Take 5 mLs by mouth every 6 (six) hours as needed for cough. 240 mL 0   inFLIXimab (REMICADE IV) Inject 100 mg into the vein every 6 (six) weeks.     irbesartan (AVAPRO) 300 MG tablet Take 1 tablet (300 mg total) by mouth daily. 30 tablet 0   Magnesium 250 MG TABS Take 1 tablet (250 mg total) by mouth daily. 30 tablet 0   Methotrexate Sodium (METHOTREXATE, PF,) 50 MG/2ML injection INJECT 1 ML ONCE WEEKLY. (25 mg)  DISCARD VIAL AFTER USE     pantoprazole (PROTONIX) 40 MG tablet Take 40 mg by mouth daily.     Probiotic Product (PROBIOTIC DAILY PO) Take by mouth.     Propylene Glycol (SYSTANE BALANCE) 0.6 % SOLN Place 1 drop into both eyes 2 (two) times daily as needed (dry eyes).     rosuvastatin (CRESTOR) 20 MG tablet Take 20 mg by mouth at bedtime.     sertraline (ZOLOFT) 25 MG tablet Take 50 mg by mouth daily.      tobramycin, PF, (TOBI) 300 MG/5ML nebulizer solution Take 5 mLs (300 mg total) by nebulization 2 (two) times daily. 280 mL 0   VITAMIN A PO Take 2,400 mcg by mouth daily.      vitamin B-12 (CYANOCOBALAMIN) 1000 MCG tablet Take 1 tablet (1,000 mcg total) by mouth daily. 30 tablet 0   gabapentin (NEURONTIN) 300 MG capsule Take 300 mg by mouth at bedtime.  1   umeclidinium-vilanterol (ANORO ELLIPTA) 62.5-25 MCG/ACT AEPB Inhale 1 puff into the lungs daily. 60 each 5   gabapentin (NEURONTIN) 300 MG capsule Take 1 capsule (300 mg total) by mouth 2 (two) times daily.  1   ID NOW COVID-19 KIT See admin instructions. for testing     No facility-administered medications prior to visit.    Review of Systems  Constitutional:  Negative for  chills, diaphoresis, fever, malaise/fatigue and weight loss.  HENT:  Negative for congestion.   Respiratory:  Positive for cough and sputum production. Negative for hemoptysis, shortness of breath and wheezing.   Cardiovascular:  Negative for chest pain, palpitations and leg swelling.    Objective:   Vitals:   03/16/22 1107  BP: 126/78  Pulse: 62  SpO2: 99%  Weight: 193 lb (87.5 kg)  Height: _0  (1.575 m)   Physical Exam: General: Well-appearing, no acute distress HENT: [place], AT Eyes: EOMI, no scleral icterus Respiratory: Clear to  auscultation bilaterally.  No crackles, wheezing or rales Cardiovascular: RRR, -M/R/G, no JVD Extremities:-Edema,-tenderness Neuro: AAO x4, CNII-XII grossly intact Psych: Normal mood, normal affect  Chest imaging: CXR 01/11/18 - No pulmonary edema, effusion or infiltrate CT HR 07/28/21 - RLL nodule 1.4 x 1 and RUL ground glas nodule. Otherwise normal parenchyma. Moderate/large hiatal hernia. 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 Unchanged coarse interstitial markings suggestive of chronic bronchitis CT Chest 12/21/21 - Right lower lobe nodule decreased from 50m to 9 mm. Stable GGO.  PFT:  08/21/18  FVC 2.4 (154%) FEV1 2.21 (156%) Ratio 74 TLC 104% DLCO corrected 78% Interpretation: Normal spirometry and lung volumes with mildly reduced DLCO  Mild obstructive defect present with mildly reduced DLCO. TLC normal. No significant bronchodilator effect present however does not preclude benefit of bronchodilator therapy.  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  Status post  tobramycin inhalation x1 month.  Has had recurrent episodes of bronchitis after completion and has noticed more purulent thick sputum in the last few days.  Unsure for ICS/LABA is effective but it has only been 1 month.  Did not feel like her LAMA LABA was effective in the past  Pseudomonas colonization - s/p tobramycin Immunosuppressed patient --Completed tobramycin --ORDER sputum culture today --If persistent PA, may need to refer to ID again for extended tobra  Acute on Chronic bronchitis --ORDER sputum culture today --CONTINUE Breo ONE puff ONCE a day. If ineffective, can return to Anoro --CONTINUE Albuterol every 4 hours as needed for shortness of breath or wheezing. REFILL --Continue Protonix 40 mg daily in setting of known hiatal hernia --CONTINUE codeine cough syrup  RLL lung nodularity- improving from 14 mm>988m--ORDER CT Chest without contrast in 6 months (07/2022)  Immunization History  Administered Date(s) Administered   Fluad Quad(high Dose 65+) 04/21/2019, 05/20/2021   Influenza Split 05/22/2012, 04/16/2014   Influenza, High Dose Seasonal PF 05/20/2018, 05/24/2020   Influenza-Unspecified 04/26/2015, 06/16/2019   PFIZER(Purple Top)SARS-COV-2 Vaccination 09/11/2019, 09/25/2019, 05/14/2020, 11/25/2020   Pfizer Covid-19 Vaccine Bivalent Booster 1227yr up 05/08/2021   Pneumococcal Conjugate-13 04/16/2014   Pneumococcal Polysaccharide-23 04/26/2015   Tdap 10/19/2014   No orders of the defined types were placed in this encounter.  Orders Placed This Encounter  Procedures   Respiratory or Resp and Sputum Culture    Standing Status:   Future    Standing Expiration Date:   03/16/2023   Return in about 4 months (around 07/17/2022).  I have spent a total time of 32-minutes on the day of the appointment reviewing prior documentation, coordinating care and discussing medical diagnosis and plan with the patient/family. Past medical history, allergies, medications were reviewed.  Pertinent imaging, labs and tests included in this note have been reviewed and interpreted independently by me.  [facility] 03/16/2022

## 2022-03-23 ENCOUNTER — Other Ambulatory Visit: Payer: Medicare Other

## 2022-03-23 DIAGNOSIS — Z2239 Carrier of other specified bacterial diseases: Secondary | ICD-10-CM

## 2022-03-23 DIAGNOSIS — R053 Chronic cough: Secondary | ICD-10-CM

## 2022-03-25 LAB — RESPIRATORY CULTURE OR RESPIRATORY AND SPUTUM CULTURE: MICRO NUMBER:: 13737295

## 2022-03-26 DIAGNOSIS — I10 Essential (primary) hypertension: Secondary | ICD-10-CM | POA: Diagnosis not present

## 2022-03-26 DIAGNOSIS — R7303 Prediabetes: Secondary | ICD-10-CM | POA: Diagnosis not present

## 2022-03-26 DIAGNOSIS — M069 Rheumatoid arthritis, unspecified: Secondary | ICD-10-CM | POA: Diagnosis not present

## 2022-03-27 DIAGNOSIS — M0589 Other rheumatoid arthritis with rheumatoid factor of multiple sites: Secondary | ICD-10-CM | POA: Diagnosis not present

## 2022-03-28 ENCOUNTER — Encounter (INDEPENDENT_AMBULATORY_CARE_PROVIDER_SITE_OTHER): Payer: Self-pay

## 2022-04-10 DIAGNOSIS — Z6836 Body mass index (BMI) 36.0-36.9, adult: Secondary | ICD-10-CM | POA: Diagnosis not present

## 2022-04-10 DIAGNOSIS — G8929 Other chronic pain: Secondary | ICD-10-CM | POA: Diagnosis not present

## 2022-04-10 DIAGNOSIS — E7800 Pure hypercholesterolemia, unspecified: Secondary | ICD-10-CM | POA: Diagnosis not present

## 2022-04-10 DIAGNOSIS — I10 Essential (primary) hypertension: Secondary | ICD-10-CM | POA: Diagnosis not present

## 2022-04-10 DIAGNOSIS — K219 Gastro-esophageal reflux disease without esophagitis: Secondary | ICD-10-CM | POA: Diagnosis not present

## 2022-04-10 DIAGNOSIS — G629 Polyneuropathy, unspecified: Secondary | ICD-10-CM | POA: Diagnosis not present

## 2022-04-10 DIAGNOSIS — M25511 Pain in right shoulder: Secondary | ICD-10-CM | POA: Diagnosis not present

## 2022-04-10 DIAGNOSIS — F419 Anxiety disorder, unspecified: Secondary | ICD-10-CM | POA: Diagnosis not present

## 2022-04-10 DIAGNOSIS — R1030 Lower abdominal pain, unspecified: Secondary | ICD-10-CM | POA: Diagnosis not present

## 2022-04-10 DIAGNOSIS — M069 Rheumatoid arthritis, unspecified: Secondary | ICD-10-CM | POA: Diagnosis not present

## 2022-04-11 ENCOUNTER — Other Ambulatory Visit: Payer: Self-pay | Admitting: Cardiovascular Disease

## 2022-04-11 DIAGNOSIS — I10 Essential (primary) hypertension: Secondary | ICD-10-CM

## 2022-04-11 NOTE — Telephone Encounter (Signed)
Rx request sent to pharmacy.  

## 2022-04-16 DIAGNOSIS — R7303 Prediabetes: Secondary | ICD-10-CM | POA: Diagnosis not present

## 2022-04-16 DIAGNOSIS — G8929 Other chronic pain: Secondary | ICD-10-CM | POA: Diagnosis not present

## 2022-04-16 DIAGNOSIS — Z6837 Body mass index (BMI) 37.0-37.9, adult: Secondary | ICD-10-CM | POA: Diagnosis not present

## 2022-04-16 DIAGNOSIS — I10 Essential (primary) hypertension: Secondary | ICD-10-CM | POA: Diagnosis not present

## 2022-04-25 DIAGNOSIS — H43813 Vitreous degeneration, bilateral: Secondary | ICD-10-CM | POA: Diagnosis not present

## 2022-04-25 DIAGNOSIS — M19011 Primary osteoarthritis, right shoulder: Secondary | ICD-10-CM | POA: Diagnosis not present

## 2022-04-25 DIAGNOSIS — H16223 Keratoconjunctivitis sicca, not specified as Sjogren's, bilateral: Secondary | ICD-10-CM | POA: Diagnosis not present

## 2022-04-25 DIAGNOSIS — H35373 Puckering of macula, bilateral: Secondary | ICD-10-CM | POA: Diagnosis not present

## 2022-04-25 DIAGNOSIS — M25511 Pain in right shoulder: Secondary | ICD-10-CM | POA: Diagnosis not present

## 2022-04-25 DIAGNOSIS — M25512 Pain in left shoulder: Secondary | ICD-10-CM | POA: Diagnosis not present

## 2022-04-25 DIAGNOSIS — H5713 Ocular pain, bilateral: Secondary | ICD-10-CM | POA: Diagnosis not present

## 2022-04-25 DIAGNOSIS — M19012 Primary osteoarthritis, left shoulder: Secondary | ICD-10-CM | POA: Diagnosis not present

## 2022-05-08 DIAGNOSIS — E669 Obesity, unspecified: Secondary | ICD-10-CM | POA: Diagnosis not present

## 2022-05-08 DIAGNOSIS — Z6835 Body mass index (BMI) 35.0-35.9, adult: Secondary | ICD-10-CM | POA: Diagnosis not present

## 2022-05-08 DIAGNOSIS — M0579 Rheumatoid arthritis with rheumatoid factor of multiple sites without organ or systems involvement: Secondary | ICD-10-CM | POA: Diagnosis not present

## 2022-05-08 DIAGNOSIS — Z1589 Genetic susceptibility to other disease: Secondary | ICD-10-CM | POA: Diagnosis not present

## 2022-05-08 DIAGNOSIS — Z79899 Other long term (current) drug therapy: Secondary | ICD-10-CM | POA: Diagnosis not present

## 2022-05-08 DIAGNOSIS — M1991 Primary osteoarthritis, unspecified site: Secondary | ICD-10-CM | POA: Diagnosis not present

## 2022-05-09 DIAGNOSIS — Z79899 Other long term (current) drug therapy: Secondary | ICD-10-CM | POA: Diagnosis not present

## 2022-05-09 DIAGNOSIS — M0589 Other rheumatoid arthritis with rheumatoid factor of multiple sites: Secondary | ICD-10-CM | POA: Diagnosis not present

## 2022-05-14 DIAGNOSIS — M48062 Spinal stenosis, lumbar region with neurogenic claudication: Secondary | ICD-10-CM | POA: Diagnosis not present

## 2022-05-14 DIAGNOSIS — M4150 Other secondary scoliosis, site unspecified: Secondary | ICD-10-CM | POA: Diagnosis not present

## 2022-05-14 DIAGNOSIS — Z6836 Body mass index (BMI) 36.0-36.9, adult: Secondary | ICD-10-CM | POA: Diagnosis not present

## 2022-05-15 ENCOUNTER — Other Ambulatory Visit (HOSPITAL_BASED_OUTPATIENT_CLINIC_OR_DEPARTMENT_OTHER): Payer: Self-pay | Admitting: Cardiovascular Disease

## 2022-05-16 ENCOUNTER — Other Ambulatory Visit (HOSPITAL_BASED_OUTPATIENT_CLINIC_OR_DEPARTMENT_OTHER): Payer: Self-pay | Admitting: Neurosurgery

## 2022-05-16 DIAGNOSIS — M461 Sacroiliitis, not elsewhere classified: Secondary | ICD-10-CM | POA: Diagnosis not present

## 2022-05-16 DIAGNOSIS — M5416 Radiculopathy, lumbar region: Secondary | ICD-10-CM | POA: Diagnosis not present

## 2022-05-16 DIAGNOSIS — M961 Postlaminectomy syndrome, not elsewhere classified: Secondary | ICD-10-CM | POA: Diagnosis not present

## 2022-05-16 DIAGNOSIS — M48062 Spinal stenosis, lumbar region with neurogenic claudication: Secondary | ICD-10-CM

## 2022-05-16 NOTE — Telephone Encounter (Signed)
Rx(s) sent to pharmacy electronically.  

## 2022-05-18 ENCOUNTER — Other Ambulatory Visit (HOSPITAL_BASED_OUTPATIENT_CLINIC_OR_DEPARTMENT_OTHER): Payer: Self-pay | Admitting: Cardiovascular Disease

## 2022-05-21 DIAGNOSIS — G8929 Other chronic pain: Secondary | ICD-10-CM | POA: Diagnosis not present

## 2022-05-21 DIAGNOSIS — R7303 Prediabetes: Secondary | ICD-10-CM | POA: Diagnosis not present

## 2022-05-21 DIAGNOSIS — R300 Dysuria: Secondary | ICD-10-CM | POA: Diagnosis not present

## 2022-05-21 DIAGNOSIS — Z6835 Body mass index (BMI) 35.0-35.9, adult: Secondary | ICD-10-CM | POA: Diagnosis not present

## 2022-05-21 DIAGNOSIS — I10 Essential (primary) hypertension: Secondary | ICD-10-CM | POA: Diagnosis not present

## 2022-05-22 DIAGNOSIS — M85852 Other specified disorders of bone density and structure, left thigh: Secondary | ICD-10-CM | POA: Diagnosis not present

## 2022-05-22 DIAGNOSIS — M810 Age-related osteoporosis without current pathological fracture: Secondary | ICD-10-CM | POA: Diagnosis not present

## 2022-05-22 DIAGNOSIS — M85851 Other specified disorders of bone density and structure, right thigh: Secondary | ICD-10-CM | POA: Diagnosis not present

## 2022-05-23 DIAGNOSIS — Z23 Encounter for immunization: Secondary | ICD-10-CM | POA: Diagnosis not present

## 2022-05-31 ENCOUNTER — Telehealth (HOSPITAL_BASED_OUTPATIENT_CLINIC_OR_DEPARTMENT_OTHER): Payer: Self-pay

## 2022-06-05 DIAGNOSIS — M5416 Radiculopathy, lumbar region: Secondary | ICD-10-CM | POA: Diagnosis not present

## 2022-06-06 DIAGNOSIS — Z23 Encounter for immunization: Secondary | ICD-10-CM | POA: Diagnosis not present

## 2022-06-07 ENCOUNTER — Ambulatory Visit (HOSPITAL_BASED_OUTPATIENT_CLINIC_OR_DEPARTMENT_OTHER)
Admission: RE | Admit: 2022-06-07 | Discharge: 2022-06-07 | Disposition: A | Discharge status: Home / Self Care | Payer: Medicare Other | Source: Ambulatory Visit | Attending: Neurosurgery | Admitting: Neurosurgery

## 2022-06-07 ENCOUNTER — Ambulatory Visit (HOSPITAL_BASED_OUTPATIENT_CLINIC_OR_DEPARTMENT_OTHER)
Admission: RE | Admit: 2022-06-07 | Discharge: 2022-06-07 | Disposition: A | Discharge status: Home / Self Care | Payer: Medicare Other | Source: Ambulatory Visit | Attending: Pulmonary Disease | Admitting: Pulmonary Disease

## 2022-06-07 DIAGNOSIS — R911 Solitary pulmonary nodule: Secondary | ICD-10-CM | POA: Diagnosis not present

## 2022-06-07 DIAGNOSIS — M48062 Spinal stenosis, lumbar region with neurogenic claudication: Secondary | ICD-10-CM | POA: Diagnosis not present

## 2022-06-07 DIAGNOSIS — M8588 Other specified disorders of bone density and structure, other site: Secondary | ICD-10-CM | POA: Diagnosis not present

## 2022-06-07 DIAGNOSIS — R918 Other nonspecific abnormal finding of lung field: Secondary | ICD-10-CM | POA: Diagnosis not present

## 2022-06-07 DIAGNOSIS — J9811 Atelectasis: Secondary | ICD-10-CM | POA: Diagnosis not present

## 2022-06-11 ENCOUNTER — Telehealth: Payer: Self-pay | Admitting: Pulmonary Disease

## 2022-06-11 DIAGNOSIS — R911 Solitary pulmonary nodule: Secondary | ICD-10-CM

## 2022-06-11 NOTE — Telephone Encounter (Signed)
[facility] Telephone Encounter  Called to update on CT Chest. Resolved RLL with cyst. Stable nodules inc RUL and LUL Interval development of LUL nodules with largest 67mm.  Addressed questions and concerns. Offered repeat scan in 1 year however patient would like closer interval.  Plan ORDERED CT Chest without contrast in 6 months  Rodman Pickle, M.D. [facility] 06/11/2022 4:14 PM

## 2022-06-12 DIAGNOSIS — M069 Rheumatoid arthritis, unspecified: Secondary | ICD-10-CM | POA: Diagnosis not present

## 2022-06-12 DIAGNOSIS — R519 Headache, unspecified: Secondary | ICD-10-CM | POA: Diagnosis not present

## 2022-06-12 DIAGNOSIS — I10 Essential (primary) hypertension: Secondary | ICD-10-CM | POA: Diagnosis not present

## 2022-06-12 DIAGNOSIS — G629 Polyneuropathy, unspecified: Secondary | ICD-10-CM | POA: Diagnosis not present

## 2022-06-12 DIAGNOSIS — R7303 Prediabetes: Secondary | ICD-10-CM | POA: Diagnosis not present

## 2022-06-12 DIAGNOSIS — F419 Anxiety disorder, unspecified: Secondary | ICD-10-CM | POA: Diagnosis not present

## 2022-06-18 DIAGNOSIS — R200 Anesthesia of skin: Secondary | ICD-10-CM | POA: Diagnosis not present

## 2022-06-18 DIAGNOSIS — M48062 Spinal stenosis, lumbar region with neurogenic claudication: Secondary | ICD-10-CM | POA: Diagnosis not present

## 2022-06-20 DIAGNOSIS — M0589 Other rheumatoid arthritis with rheumatoid factor of multiple sites: Secondary | ICD-10-CM | POA: Diagnosis not present

## 2022-06-25 ENCOUNTER — Other Ambulatory Visit (HOSPITAL_COMMUNITY): Payer: Self-pay | Admitting: Neurosurgery

## 2022-06-25 ENCOUNTER — Other Ambulatory Visit: Payer: Self-pay | Admitting: Neurosurgery

## 2022-06-25 DIAGNOSIS — M48062 Spinal stenosis, lumbar region with neurogenic claudication: Secondary | ICD-10-CM

## 2022-06-27 DIAGNOSIS — H16223 Keratoconjunctivitis sicca, not specified as Sjogren's, bilateral: Secondary | ICD-10-CM | POA: Diagnosis not present

## 2022-06-27 DIAGNOSIS — H5713 Ocular pain, bilateral: Secondary | ICD-10-CM | POA: Diagnosis not present

## 2022-06-27 DIAGNOSIS — H43813 Vitreous degeneration, bilateral: Secondary | ICD-10-CM | POA: Diagnosis not present

## 2022-06-27 DIAGNOSIS — H35373 Puckering of macula, bilateral: Secondary | ICD-10-CM | POA: Diagnosis not present

## 2022-07-06 ENCOUNTER — Other Ambulatory Visit: Payer: Self-pay

## 2022-07-06 ENCOUNTER — Ambulatory Visit (HOSPITAL_COMMUNITY)
Admission: RE | Admit: 2022-07-06 | Discharge: 2022-07-06 | Disposition: A | Discharge status: Home / Self Care | Payer: Medicare Other | Source: Ambulatory Visit | Attending: Neurosurgery | Admitting: Neurosurgery

## 2022-07-06 DIAGNOSIS — Z981 Arthrodesis status: Secondary | ICD-10-CM | POA: Diagnosis not present

## 2022-07-06 DIAGNOSIS — I700 Atherosclerosis of aorta: Secondary | ICD-10-CM | POA: Insufficient documentation

## 2022-07-06 DIAGNOSIS — M4807 Spinal stenosis, lumbosacral region: Secondary | ICD-10-CM | POA: Insufficient documentation

## 2022-07-06 DIAGNOSIS — M4316 Spondylolisthesis, lumbar region: Secondary | ICD-10-CM | POA: Diagnosis not present

## 2022-07-06 DIAGNOSIS — M47814 Spondylosis without myelopathy or radiculopathy, thoracic region: Secondary | ICD-10-CM | POA: Diagnosis not present

## 2022-07-06 DIAGNOSIS — M47816 Spondylosis without myelopathy or radiculopathy, lumbar region: Secondary | ICD-10-CM | POA: Diagnosis not present

## 2022-07-06 DIAGNOSIS — M48061 Spinal stenosis, lumbar region without neurogenic claudication: Secondary | ICD-10-CM | POA: Diagnosis not present

## 2022-07-06 DIAGNOSIS — M4126 Other idiopathic scoliosis, lumbar region: Secondary | ICD-10-CM | POA: Diagnosis not present

## 2022-07-06 DIAGNOSIS — M5124 Other intervertebral disc displacement, thoracic region: Secondary | ICD-10-CM | POA: Diagnosis not present

## 2022-07-06 DIAGNOSIS — M48062 Spinal stenosis, lumbar region with neurogenic claudication: Secondary | ICD-10-CM

## 2022-07-06 DIAGNOSIS — M4726 Other spondylosis with radiculopathy, lumbar region: Secondary | ICD-10-CM | POA: Diagnosis not present

## 2022-07-06 MED ORDER — LIDOCAINE HCL (PF) 1 % IJ SOLN
5.0000 mL | Freq: Once | INTRAMUSCULAR | Status: AC
  Administered 2022-07-06: 5 mL via INTRADERMAL

## 2022-07-06 MED ORDER — IOHEXOL 300 MG/ML  SOLN
100.0000 mL | Freq: Once | INTRAMUSCULAR | Status: AC | PRN
  Administered 2022-07-06: 100 mL via INTRATHECAL

## 2022-07-06 MED ORDER — ONDANSETRON HCL 4 MG/2ML IJ SOLN: 4.0000 mg | Freq: Four times a day (QID) | INTRAMUSCULAR | Status: DC | PRN

## 2022-07-06 MED ORDER — DIAZEPAM 5 MG PO TABS
5.0000 mg | ORAL_TABLET | Freq: Once | ORAL | Status: AC
  Administered 2022-07-06: 5 mg via ORAL
  Filled 2022-07-06: qty 1

## 2022-07-06 MED ORDER — OXYCODONE HCL 5 MG PO TABS: 5.0000 mg | ORAL_TABLET | ORAL | Status: DC | PRN

## 2022-07-06 NOTE — Progress Notes (Signed)
Pt remained pain free throughout stay, VSS. All d/c instruction given, written and verbal. No further needs or concerns at this time. Pt remains stable and bandaid to lower back remains clean, dry and intact.

## 2022-07-11 DIAGNOSIS — R519 Headache, unspecified: Secondary | ICD-10-CM | POA: Diagnosis not present

## 2022-07-11 DIAGNOSIS — M791 Myalgia, unspecified site: Secondary | ICD-10-CM | POA: Diagnosis not present

## 2022-07-11 DIAGNOSIS — M542 Cervicalgia: Secondary | ICD-10-CM | POA: Diagnosis not present

## 2022-07-11 DIAGNOSIS — G518 Other disorders of facial nerve: Secondary | ICD-10-CM | POA: Diagnosis not present

## 2022-07-14 ENCOUNTER — Other Ambulatory Visit: Payer: Self-pay | Admitting: Cardiovascular Disease

## 2022-07-14 DIAGNOSIS — I10 Essential (primary) hypertension: Secondary | ICD-10-CM

## 2022-07-16 ENCOUNTER — Other Ambulatory Visit: Payer: Self-pay | Admitting: Cardiovascular Disease

## 2022-07-16 DIAGNOSIS — I10 Essential (primary) hypertension: Secondary | ICD-10-CM

## 2022-07-16 NOTE — Telephone Encounter (Signed)
Rx(s) sent to pharmacy electronically.  

## 2022-07-19 DIAGNOSIS — M48062 Spinal stenosis, lumbar region with neurogenic claudication: Secondary | ICD-10-CM | POA: Diagnosis not present

## 2022-07-19 DIAGNOSIS — M533 Sacrococcygeal disorders, not elsewhere classified: Secondary | ICD-10-CM | POA: Diagnosis not present

## 2022-07-20 ENCOUNTER — Other Ambulatory Visit (HOSPITAL_BASED_OUTPATIENT_CLINIC_OR_DEPARTMENT_OTHER): Payer: Medicare Other

## 2022-07-26 DIAGNOSIS — M545 Low back pain, unspecified: Secondary | ICD-10-CM | POA: Diagnosis not present

## 2022-07-26 DIAGNOSIS — M48062 Spinal stenosis, lumbar region with neurogenic claudication: Secondary | ICD-10-CM | POA: Diagnosis not present

## 2022-08-01 DIAGNOSIS — M0589 Other rheumatoid arthritis with rheumatoid factor of multiple sites: Secondary | ICD-10-CM | POA: Diagnosis not present

## 2022-08-01 DIAGNOSIS — Z79899 Other long term (current) drug therapy: Secondary | ICD-10-CM | POA: Diagnosis not present

## 2022-08-02 DIAGNOSIS — M48062 Spinal stenosis, lumbar region with neurogenic claudication: Secondary | ICD-10-CM | POA: Diagnosis not present

## 2022-08-02 DIAGNOSIS — R200 Anesthesia of skin: Secondary | ICD-10-CM | POA: Diagnosis not present

## 2022-08-06 NOTE — Therapy (Unsigned)
OUTPATIENT PHYSICAL THERAPY THORACOLUMBAR EVALUATION   Patient Name: Joy [name] MRN: [id number] DOB:10-16-1940, 81 y.o., female Today's Date: 08/07/2022  END OF SESSION:  PT End of Session - 08/07/22 1221     Visit Number 1    Date for PT Re-Evaluation 10/16/22    PT Start Time 1220    PT Stop Time 2355    PT Time Calculation (min) 45 min    Activity Tolerance Patient tolerated treatment well    Behavior During Therapy [facility] for tasks assessed/performed             Past Medical History:  Diagnosis Date   Ankylosing spondylitis ([place])    Anxiety    Arthritis    RHEUMATOID   Back pain    Bronchitis    Constipation    COPD (chronic obstructive pulmonary disease) ([place])    CXR 01/11/18 showed mild COPD and chronic bronchitis   CTS (carpal tunnel syndrome)    Dry eye    Dry mouth    Dysrhythmia    "irregularity" unknown at this time - being evaluated by cardiology   Essential hypertension 11/24/2015   GERD (gastroesophageal reflux disease)    HLA B27 (HLA B27 positive)    Hypercholesteremia    Hyperlipidemia 11/24/2015   Hypertension    IBS (irritable bowel syndrome)    Joint pain    Neuropathy    OAB (overactive bladder)    Obesity    Osteoarthritis    Osteoporosis    Pre-diabetes    Rheumatoid arthritis ([place]) 11/24/2015   Sciatica    Seasonal allergies    Spinal stenosis    Past Surgical History:  Procedure Laterality Date   ABDOMINAL HYSTERECTOMY  1995   BACK SURGERY     BREAST SURGERY     REDUCTION   CATARACT EXTRACTION W/PHACO  08/01/2012   Procedure: CATARACT EXTRACTION PHACO AND INTRAOCULAR LENS PLACEMENT ([place]);  Surgeon: Marylynn Pearson, MD;  Location: [place];  Service: Ophthalmology;  Laterality: Right;   EYE SURGERY Bilateral    cataract   HERNIA REPAIR     RIGHT ING.   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   Medication monitoring encounter 09/15/2021   Pain of left hip joint 08/10/2020   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   Prediabetes 02/01/2020   Body mass index (BMI) 36.0-36.9, adult 02/01/2020   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   Inflammation of sacroiliac joint ([place]) 10/06/2018   Chronic bronchitis ([place]) 09/25/2018   Mucopurulent chronic bronchitis ([place]) 08/21/2018   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   Spondylosis 06/13/2018   Status post lumbar spinal fusion 06/13/2018   Status post lumbar spine surgery for decompression of spinal cord 06/13/2018   History of total knee replacement, 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   History of total knee arthroplasty, right 09/20/2017   OA (osteoarthritis) of knee 12/26/2015   Essential hypertension 11/24/2015  Hyperlipidemia 11/24/2015   Rheumatoid arthritis ([place]) 11/24/2015   Spinal stenosis of lumbar region 08/24/2014   Lumbar spondylosis 07/21/2014    PCP: Marda Stalker, PA-C  REFERRING PROVIDER: Ashok Pall, MD  REFERRING DIAG: [phone] (ICD-10-CM) - Spinal stenosis, lumbar region with neurogenic claudication   Rationale for Evaluation and Treatment: Rehabilitation  THERAPY DIAG:  Abnormal posture  Difficulty in walking, not elsewhere classified  Muscle weakness (generalized)  Other lack of coordination  Unsteadiness on feet  Chronic bilateral low back pain with bilateral sciatica  ONSET DATE: 08/03/22  SUBJECTIVE:                                                                                                                                                                                            SUBJECTIVE STATEMENT: Patient reports 3-4 back surgeries. She continues to have pain. Myelogram and MRI show no new changes. She wakes up with pain in her Low Back that extends down her L lateral leg. She also has new pain in her feet and RA. She has the most pain at night and first thing in the morning. It takes her 2-3 hours each morning to get moving and feel better. She used to work out at [employer] with [employer] and aqua therapy, but has not been going lately. She walks at [facility] about twice a week and has to climb stairs at home.  PERTINENT HISTORY:  Thoraco-Lumbar fusion T11-L3 Persistent pain in back and L leg RA BTKR  PAIN:  Are you having pain? Yes: NPRS scale: 10/10 Pain location: low back and L leg Pain description: unable to describe the pain. Can be sharp, but also achy or burning. Aggravating factors: worst first thing in the morning or middle of the night Relieving factors: heat  PRECAUTIONS: None  WEIGHT BEARING RESTRICTIONS: No  FALLS:  Has patient fallen in last 6 months? No  LIVING ENVIRONMENT: Lives with: lives with their family Lives in: House/apartment Stairs: Yes: Internal: 14 steps; on left going up and External: 1 steps; none Has following equipment at home: Single point cane, Walker - 2 wheeled, Environmental consultant - 4 wheeled, and Grab bars  OCCUPATION: N/A  PLOF: Independent  PATIENT GOALS: Relieve her pain  NEXT MD VISIT: About 6 weeks  OBJECTIVE:   DIAGNOSTIC FINDINGS:  Lumbar MRI 1. Prior thoracolumbar fusion as detailed above. Unchanged lucency about the T11, L3, and S1 screws associated with absent solid arthrodesis at T11-12, L2-3, and L5-S1. 2. Suspected chronic severe left and moderate right neural foraminal stenosis at L5-S1. 3. Mild-to-moderate neural foraminal stenosis  at L2-3. 4. Widely patent lumbar spinal canal following  decompression. 5. Mild thoracic disc degeneration and advanced upper thoracic facet arthrosis without significant stenosis. 6.  Aortic Atherosclerosis (ICD10-I70.0).  SCREENING FOR RED FLAGS: Bowel or bladder incontinence: No Spinal tumors: No Cauda equina syndrome: No Compression fracture: No Abdominal aneurysm: No  COGNITION: Overall cognitive status: Within functional limits for tasks assessed     SENSATION: Not tested  MUSCLE LENGTH: Hamstrings: Right 50 deg; Left 65 deg Thomas test: B hip flexors appear tight  POSTURE: rounded shoulders, forward head, decreased lumbar lordosis, decreased thoracic kyphosis, posterior pelvic tilt, and flexed trunk   PALPATION: TTP mid Thoracic paraspinals and lower lumbar paraspinals. Severely TTP along B gluts, piriformis-tight, B ITB TTP and tight  LUMBAR ROM:   AROM eval  Flexion WFL  Extension Severely limited  Right lateral flexion Mildly limited, painful  Left lateral flexion Mildly limited, painful  Right rotation WFL  Left rotation WFL   (Blank rows = not tested)  LOWER EXTREMITY ROM:   WFL, but stiff except where noted.  Passive  Right eval Left eval  Hip flexion Mod limited Mod limited  Hip extension Mod limited Mod limited  Hip abduction    Hip adduction    Hip internal rotation Mild Mod  Hip external rotation Mod Mod  Knee flexion    Knee extension    Ankle dorsiflexion    Ankle plantarflexion    Ankle inversion    Ankle eversion      LOWER EXTREMITY MMT:  B hip ext 4-/5, otherwise 4+/5  LUMBAR SPECIAL TESTS:  Straight leg raise test: Negative and Slump test: Negative  FUNCTIONAL TESTS:  5 times sit to stand: 11.93 Functional gait assessment: 16 /30  GAIT: Distance walked: clinic distances Assistive device utilized: Single point cane and used first thing in the morning Level of assistance: Modified independence Comments: Flexed posture,  shuffling with increased lateral sway and increased effort.  TODAY'S TREATMENT:                                                                                                                              DATE:  08/07/22  Education Supine stretching- LTR, SKTC, active HS, figure 4, gluts   PATIENT EDUCATION:  Education details: POC, HEP Person educated: Patient Education method: Consulting civil engineer, Media planner, and Handouts Education comprehension: verbalized understanding and returned demonstration  HOME EXERCISE PROGRAM:  [id number]  ASSESSMENT:  CLINICAL IMPRESSION: Patient is a 81 y.o. who was seen today for physical therapy evaluation and treatment for chronic LBP with radiation into her LLE. She demonstrates a lot of tightness and spasm in B gluts, ITB, piriformis. She demonstrates some hip weakness, trunk weakness, spinal immobility. She will benefit from PT to address her musculoskeletal issues to try to decrease the spasm and tightness in her muscles, improve lower trunk mobility, and strengthen her trunk to manage her chronic back pain.   OBJECTIVE IMPAIRMENTS: Abnormal gait, decreased activity tolerance, decreased balance, decreased coordination, decreased endurance, decreased mobility, difficulty  walking, decreased ROM, decreased strength, increased muscle spasms, impaired flexibility, improper body mechanics, postural dysfunction, and pain.   ACTIVITY LIMITATIONS: carrying, lifting, bending, sitting, standing, squatting, sleeping, stairs, and locomotion level  PARTICIPATION LIMITATIONS: cleaning, shopping, and community activity  PERSONAL FACTORS: Age, Past/current experiences, and 1 comorbidity: multiple back surgeries  are also affecting patient's functional outcome.   REHAB POTENTIAL: Good  CLINICAL DECISION MAKING: Stable/uncomplicated  EVALUATION COMPLEXITY: Low   GOALS: Goals reviewed with patient? Yes  SHORT TERM GOALS: Target date: 08/24/22  I with initial  HEP Baseline: Goal status: INITIAL  LONG TERM GOALS: Target date: 10/16/22  I with final HEP Baseline:  Goal status: INITIAL  2.  Patient will report decreased back pain by 50% Baseline:  Goal status: INITIAL  3.  Improve FGA score to at least 24 to demonstrate improved balance. Baseline: 16 Goal status: INITIAL  4.  Patient will walk at least 300' on level and unlevel surfaces with pain < 4/10, no unsteadiness, I. Baseline:  Goal status: INITIAL  5.  Up and down 14 steps using U rail and step over step technique. Baseline:  Goal status: INITIAL PLAN:  PT FREQUENCY: 2x/week  PT DURATION: 10 weeks  PLANNED INTERVENTIONS: Therapeutic exercises, Therapeutic activity, Neuromuscular re-education, Balance training, Gait training, Patient/Family education, Self Care, Joint mobilization, Stair training, Dry Needling, Electrical stimulation, Cryotherapy, Moist heat, Ionotophoresis 33m/ml Dexamethasone, and Manual therapy.  PLAN FOR NEXT SESSION: Update HEP with strengthening, spinal mobility exercises.   SMarcelina Morel DPT 08/07/2022, 1:36 PM

## 2022-08-07 ENCOUNTER — Ambulatory Visit: Payer: Medicare Other | Attending: Neurosurgery | Admitting: Physical Therapy

## 2022-08-07 DIAGNOSIS — R293 Abnormal posture: Secondary | ICD-10-CM | POA: Insufficient documentation

## 2022-08-07 DIAGNOSIS — R278 Other lack of coordination: Secondary | ICD-10-CM | POA: Diagnosis not present

## 2022-08-07 DIAGNOSIS — R2681 Unsteadiness on feet: Secondary | ICD-10-CM | POA: Diagnosis not present

## 2022-08-07 DIAGNOSIS — G8929 Other chronic pain: Secondary | ICD-10-CM | POA: Insufficient documentation

## 2022-08-07 DIAGNOSIS — M6281 Muscle weakness (generalized): Secondary | ICD-10-CM | POA: Insufficient documentation

## 2022-08-07 DIAGNOSIS — M5442 Lumbago with sciatica, left side: Secondary | ICD-10-CM | POA: Insufficient documentation

## 2022-08-07 DIAGNOSIS — M5441 Lumbago with sciatica, right side: Secondary | ICD-10-CM | POA: Diagnosis not present

## 2022-08-07 DIAGNOSIS — R262 Difficulty in walking, not elsewhere classified: Secondary | ICD-10-CM | POA: Diagnosis not present

## 2022-08-09 ENCOUNTER — Ambulatory Visit: Payer: Medicare Other | Admitting: Physical Therapy

## 2022-08-09 DIAGNOSIS — M5442 Lumbago with sciatica, left side: Secondary | ICD-10-CM | POA: Diagnosis not present

## 2022-08-09 DIAGNOSIS — M6281 Muscle weakness (generalized): Secondary | ICD-10-CM | POA: Diagnosis not present

## 2022-08-09 DIAGNOSIS — R2681 Unsteadiness on feet: Secondary | ICD-10-CM | POA: Diagnosis not present

## 2022-08-09 DIAGNOSIS — R278 Other lack of coordination: Secondary | ICD-10-CM | POA: Diagnosis not present

## 2022-08-09 DIAGNOSIS — R262 Difficulty in walking, not elsewhere classified: Secondary | ICD-10-CM | POA: Diagnosis not present

## 2022-08-09 DIAGNOSIS — R293 Abnormal posture: Secondary | ICD-10-CM

## 2022-08-09 NOTE — Therapy (Signed)
OUTPATIENT PHYSICAL THERAPY THORACOLUMBAR   Patient Name: Joy [name] MRN: [id number] DOB:August 22, 1940, 81 y.o., female Today's Date: 08/09/2022  END OF SESSION:  PT End of Session - 08/09/22 1137     Visit Number 2    Date for PT Re-Evaluation 10/16/22    PT Start Time 1140    PT Stop Time 1225    PT Time Calculation (min) 45 min             Past Medical History:  Diagnosis Date   Ankylosing spondylitis ([place])    Anxiety    Arthritis    RHEUMATOID   Back pain    Bronchitis    Constipation    COPD (chronic obstructive pulmonary disease) ([place])    CXR 01/11/18 showed mild COPD and chronic bronchitis   CTS (carpal tunnel syndrome)    Dry eye    Dry mouth    Dysrhythmia    "irregularity" unknown at this time - being evaluated by cardiology   Essential hypertension 11/24/2015   GERD (gastroesophageal reflux disease)    HLA B27 (HLA B27 positive)    Hypercholesteremia    Hyperlipidemia 11/24/2015   Hypertension    IBS (irritable bowel syndrome)    Joint pain    Neuropathy    OAB (overactive bladder)    Obesity    Osteoarthritis    Osteoporosis    Pre-diabetes    Rheumatoid arthritis ([place]) 11/24/2015   Sciatica    Seasonal allergies    Spinal stenosis    Past Surgical History:  Procedure Laterality Date   ABDOMINAL HYSTERECTOMY  1995   BACK SURGERY     BREAST SURGERY     REDUCTION   CATARACT EXTRACTION W/PHACO  08/01/2012   Procedure: CATARACT EXTRACTION PHACO AND INTRAOCULAR LENS PLACEMENT ([place]);  Surgeon: Marylynn Pearson, MD;  Location: [place];  Service: Ophthalmology;  Laterality: Right;   EYE SURGERY Bilateral    cataract   HERNIA REPAIR     RIGHT ING.   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 10/10/2021   Constipation 10/10/2021   Numbness of lower limb 10/10/2021   Thrush 10/10/2021   Sacroiliac joint pain 10/10/2021   Paraparesis ([place]) 10/10/2021   Pseudomonas aeruginosa colonization 10/02/2021   Medication monitoring encounter 09/15/2021   Pain of left hip joint 08/10/2020   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   Prediabetes 02/01/2020   Body mass index (BMI) 36.0-36.9, adult 02/01/2020   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   Inflammation of sacroiliac joint ([place]) 10/06/2018   Chronic bronchitis ([place]) 09/25/2018   Mucopurulent chronic bronchitis ([place]) 08/21/2018   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   Spondylosis 06/13/2018   Status post lumbar spinal fusion 06/13/2018   Status post lumbar spine surgery for decompression of spinal cord 06/13/2018   History of total knee replacement, 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   History of total knee arthroplasty, right 09/20/2017   OA (osteoarthritis) of knee 12/26/2015   Essential hypertension 11/24/2015   Hyperlipidemia 11/24/2015   Rheumatoid arthritis ([place]) 11/24/2015   Spinal stenosis of lumbar region 08/24/2014   Lumbar spondylosis  07/21/2014    PCP: Marda Stalker, PA-C  REFERRING PROVIDER: Ashok Pall, MD  REFERRING DIAG: [phone] (ICD-10-CM) - Spinal stenosis, lumbar region with neurogenic claudication   Rationale for Evaluation and Treatment: Rehabilitation  THERAPY DIAG:  Abnormal posture  Difficulty in walking, not elsewhere classified  Muscle weakness (generalized)  ONSET DATE: 08/03/22  SUBJECTIVE:                                                                                                                                                                                           SUBJECTIVE STATEMENT: Morning are rough and slow  moving , get up go to bathroom and back to bed with heat. Better now PERTINENT HISTORY:  Thoraco-Lumbar fusion T11-L3 Persistent pain in back and L leg RA BTKR  PAIN:  Are you having pain? 1/10 back  PRECAUTIONS: None  WEIGHT BEARING RESTRICTIONS: No  FALLS:  Has patient fallen in last 6 months? No  LIVING ENVIRONMENT: Lives with: lives with their family Lives in: House/apartment Stairs: Yes: Internal: 14 steps; on left going up and External: 1 steps; none Has following equipment at home: Single point cane, Walker - 2 wheeled, Environmental consultant - 4 wheeled, and Grab bars  OCCUPATION: N/A  PLOF: Independent  PATIENT GOALS: Relieve her pain  NEXT MD VISIT: About 6 weeks  OBJECTIVE:   DIAGNOSTIC FINDINGS:  Lumbar MRI 1. Prior thoracolumbar fusion as detailed above. Unchanged lucency about the T11, L3, and S1 screws associated with absent solid arthrodesis at T11-12, L2-3, and L5-S1. 2. Suspected chronic severe left and moderate right neural foraminal stenosis at L5-S1. 3. Mild-to-moderate neural foraminal stenosis at L2-3. 4. Widely patent lumbar spinal canal following decompression. 5. Mild thoracic disc degeneration and advanced upper thoracic facet arthrosis without significant stenosis. 6.  Aortic Atherosclerosis (ICD10-I70.0).  SCREENING FOR RED FLAGS: Bowel or bladder incontinence: No Spinal tumors: No Cauda equina syndrome: No Compression fracture: No Abdominal aneurysm: No  COGNITION: Overall cognitive status: Within functional limits for tasks assessed     SENSATION: Not tested  MUSCLE LENGTH: Hamstrings: Right 50 deg; Left 65 deg Thomas test: B hip flexors appear tight  POSTURE: rounded shoulders, forward head, decreased lumbar lordosis, decreased thoracic kyphosis, posterior pelvic tilt, and flexed trunk   PALPATION: TTP mid Thoracic paraspinals and lower lumbar paraspinals. Severely TTP along B gluts, piriformis-tight, B ITB TTP and tight  LUMBAR ROM:    AROM eval  Flexion WFL  Extension Severely limited  Right lateral flexion Mildly limited, painful  Left lateral flexion Mildly limited, painful  Right rotation WFL  Left rotation WFL   (Blank rows = not tested)  LOWER  EXTREMITY ROM:   WFL, but stiff except where noted.  Passive  Right eval Left eval  Hip flexion Mod limited Mod limited  Hip extension Mod limited Mod limited  Hip abduction    Hip adduction    Hip internal rotation Mild Mod  Hip external rotation Mod Mod  Knee flexion    Knee extension    Ankle dorsiflexion    Ankle plantarflexion    Ankle inversion    Ankle eversion      LOWER EXTREMITY MMT:  B hip ext 4-/5, otherwise 4+/5  LUMBAR SPECIAL TESTS:  Straight leg raise test: Negative and Slump test: Negative  FUNCTIONAL TESTS:  5 times sit to stand: 11.93 Functional gait assessment: 16 /30  GAIT: Distance walked: clinic distances Assistive device utilized: Single point cane and used first thing in the morning Level of assistance: Modified independence Comments: Flexed posture, shuffling with increased lateral sway and increased effort.  TODAY'S TREATMENT:                                                                                                                              DATE:   08/09/22  Nustep L 5 6 min Black trunk flex and ext 2 sets 10 seated Supine feet on ball bridge, KTC and obl 2 sets 10  Isometric abdominals supine with ball 15 times hold 3 sec LE /trunk stretching supine. STW and stripping to Left IT, educated in having daughter use rolling pin at home for release and lye EOB in SL hanging leg off Red tband clams 2 sets 10 Hip flex red tband 2 sets 10 Add ball squeeze 15 x      08/07/22  Education Supine stretching- LTR, SKTC, active HS, figure 4, gluts   PATIENT EDUCATION:  Education details: POC, HEP Person educated: Patient Education method: Consulting civil engineer, Media planner, and Handouts Education comprehension:  verbalized understanding and returned demonstration  HOME EXERCISE PROGRAM:  [id number]  ASSESSMENT:  CLINICAL IMPRESSION: pt reports doing HEP, added in some IT stretching and rolling at home. Pt is very tight in LE and back L>Rt. Progressed ex with cuing OBJECTIVE IMPAIRMENTS: Abnormal gait, decreased activity tolerance, decreased balance, decreased coordination, decreased endurance, decreased mobility, difficulty walking, decreased ROM, decreased strength, increased muscle spasms, impaired flexibility, improper body mechanics, postural dysfunction, and pain.   ACTIVITY LIMITATIONS: carrying, lifting, bending, sitting, standing, squatting, sleeping, stairs, and locomotion level  PARTICIPATION LIMITATIONS: cleaning, shopping, and community activity  PERSONAL FACTORS: Age, Past/current experiences, and 1 comorbidity: multiple back surgeries  are also affecting patient's functional outcome.   REHAB POTENTIAL: Good  CLINICAL DECISION MAKING: Stable/uncomplicated  EVALUATION COMPLEXITY: Low   GOALS: Goals reviewed with patient? Yes  SHORT TERM GOALS: Target date: 08/24/22  I with initial HEP Baseline: Goal status: INITIAL  LONG TERM GOALS: Target date: 10/16/22  I with final HEP Baseline:  Goal status: INITIAL  2.  Patient will report decreased back pain by 50% Baseline:  Goal  status: INITIAL  3.  Improve FGA score to at least 24 to demonstrate improved balance. Baseline: 16 Goal status: INITIAL  4.  Patient will walk at least 300' on level and unlevel surfaces with pain < 4/10, no unsteadiness, I. Baseline:  Goal status: INITIAL  5.  Up and down 14 steps using U rail and step over step technique. Baseline:  Goal status: INITIAL PLAN:  PT FREQUENCY: 2x/week  PT DURATION: 10 weeks  PLANNED INTERVENTIONS: Therapeutic exercises, Therapeutic activity, Neuromuscular re-education, Balance training, Gait training, Patient/Family education, Self Care, Joint mobilization,  Stair training, Dry Needling, Electrical stimulation, Cryotherapy, Moist heat, Ionotophoresis 77m/ml Dexamethasone, and Manual therapy.  PLAN FOR NEXT SESSION: Update HEP with strengthening, spinal mobility exercises.   ALevada DyPayseur PTA  08/09/2022, 11:38 AM [place] [address]Phone: [phone]  Fax:  [phone] Patient Details  Name: CONIA SHIFLETTMRN: [id number] of Birth: [birth year]Referring Provider:  WMarda Stalker PA-C  Encounter Date: 08/09/2022   PLaqueta Carina PTA 08/09/2022, 11:38 AM  [place] [address]Phone: [phone]  Fax:  [phone]

## 2022-08-15 ENCOUNTER — Ambulatory Visit: Payer: Medicare Other

## 2022-08-15 DIAGNOSIS — R262 Difficulty in walking, not elsewhere classified: Secondary | ICD-10-CM

## 2022-08-15 DIAGNOSIS — R278 Other lack of coordination: Secondary | ICD-10-CM | POA: Diagnosis not present

## 2022-08-15 DIAGNOSIS — M6281 Muscle weakness (generalized): Secondary | ICD-10-CM | POA: Diagnosis not present

## 2022-08-15 DIAGNOSIS — R2681 Unsteadiness on feet: Secondary | ICD-10-CM | POA: Diagnosis not present

## 2022-08-15 DIAGNOSIS — R293 Abnormal posture: Secondary | ICD-10-CM | POA: Diagnosis not present

## 2022-08-15 DIAGNOSIS — M5442 Lumbago with sciatica, left side: Secondary | ICD-10-CM | POA: Diagnosis not present

## 2022-08-15 DIAGNOSIS — G8929 Other chronic pain: Secondary | ICD-10-CM

## 2022-08-15 NOTE — Therapy (Signed)
OUTPATIENT PHYSICAL THERAPY THORACOLUMBAR   Patient Name: Joy [name] MRN: [id number] DOB:1941-01-05, 81 y.o., female Today's Date: 08/15/2022  END OF SESSION:  PT End of Session - 08/15/22 1248     Visit Number 3    Date for PT Re-Evaluation 10/16/22    PT Start Time 1245    PT Stop Time 1317   late arrival   PT Time Calculation (min) 32 min    Activity Tolerance Patient tolerated treatment well    Behavior During Therapy [facility] for tasks assessed/performed              Past Medical History:  Diagnosis Date   Ankylosing spondylitis ([place])    Anxiety    Arthritis    RHEUMATOID   Back pain    Bronchitis    Constipation    COPD (chronic obstructive pulmonary disease) ([place])    CXR 01/11/18 showed mild COPD and chronic bronchitis   CTS (carpal tunnel syndrome)    Dry eye    Dry mouth    Dysrhythmia    "irregularity" unknown at this time - being evaluated by cardiology   Essential hypertension 11/24/2015   GERD (gastroesophageal reflux disease)    HLA B27 (HLA B27 positive)    Hypercholesteremia    Hyperlipidemia 11/24/2015   Hypertension    IBS (irritable bowel syndrome)    Joint pain    Neuropathy    OAB (overactive bladder)    Obesity    Osteoarthritis    Osteoporosis    Pre-diabetes    Rheumatoid arthritis ([place]) 11/24/2015   Sciatica    Seasonal allergies    Spinal stenosis    Past Surgical History:  Procedure Laterality Date   ABDOMINAL HYSTERECTOMY  1995   BACK SURGERY     BREAST SURGERY     REDUCTION   CATARACT EXTRACTION W/PHACO  08/01/2012   Procedure: CATARACT EXTRACTION PHACO AND INTRAOCULAR LENS PLACEMENT ([place]);  Surgeon: Marylynn Pearson, MD;  Location: [place];  Service: Ophthalmology;  Laterality: Right;   EYE SURGERY Bilateral    cataract   HERNIA REPAIR     RIGHT ING.   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   Medication monitoring encounter 09/15/2021   Pain of left hip joint 08/10/2020   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   Prediabetes 02/01/2020   Body mass index (BMI) 36.0-36.9, adult 02/01/2020   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   Inflammation of sacroiliac joint ([place]) 10/06/2018   Chronic bronchitis ([place]) 09/25/2018   Mucopurulent chronic bronchitis ([place]) 08/21/2018   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   Spondylosis 06/13/2018   Status post lumbar spinal fusion 06/13/2018   Status post lumbar spine surgery for decompression of spinal cord 06/13/2018   History of total knee replacement, 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   History of total knee arthroplasty, right 09/20/2017   OA (osteoarthritis) of knee 12/26/2015   Essential hypertension  11/24/2015   Hyperlipidemia 11/24/2015   Rheumatoid arthritis ([place]) 11/24/2015   Spinal stenosis of lumbar region 08/24/2014   Lumbar spondylosis 07/21/2014    PCP: Marda Stalker, PA-C  REFERRING PROVIDER: Ashok Pall, MD  REFERRING DIAG: [phone] (ICD-10-CM) - Spinal stenosis, lumbar region with neurogenic claudication   Rationale for Evaluation and Treatment: Rehabilitation  THERAPY DIAG:  Muscle weakness (generalized)  Chronic bilateral low back pain with bilateral sciatica  Difficulty in walking, not elsewhere classified  ONSET DATE: 08/03/22  SUBJECTIVE:                                                                                                                                                                                            SUBJECTIVE STATEMENT: Patient states she did well for a few days after the last session. Patient states that now she is hurting a lot more and her feet are hurting and when she walks in certain ways.   PERTINENT HISTORY:  Thoraco-Lumbar fusion T11-L3 Persistent pain in back and L leg RA BTKR  PAIN:  Are you having pain? 1/10 back  PRECAUTIONS: None  WEIGHT BEARING RESTRICTIONS: No  FALLS:  Has patient fallen in last 6 months? No  LIVING ENVIRONMENT: Lives with: lives with their family Lives in: House/apartment Stairs: Yes: Internal: 14 steps; on left going up and External: 1 steps; none Has following equipment at home: Single point cane, Walker - 2 wheeled, Environmental consultant - 4 wheeled, and Grab bars  OCCUPATION: N/A  PLOF: Independent  PATIENT GOALS: Relieve her pain  NEXT MD VISIT: About 6 weeks  OBJECTIVE:   DIAGNOSTIC FINDINGS:  Lumbar MRI 1. Prior thoracolumbar fusion as detailed above. Unchanged lucency about the T11, L3, and S1 screws associated with absent solid arthrodesis at T11-12, L2-3, and L5-S1. 2. Suspected chronic severe left and moderate right neural foraminal stenosis at L5-S1. 3. Mild-to-moderate neural foraminal stenosis at L2-3. 4. Widely patent lumbar spinal canal following decompression. 5. Mild thoracic disc degeneration and advanced upper thoracic facet arthrosis without significant stenosis. 6.  Aortic Atherosclerosis (ICD10-I70.0).  SCREENING FOR RED FLAGS: Bowel or bladder incontinence: No Spinal tumors: No Cauda equina syndrome: No Compression fracture: No Abdominal aneurysm: No  COGNITION: Overall cognitive status: Within functional limits for tasks assessed     SENSATION: Not tested  MUSCLE LENGTH: Hamstrings: Right 50 deg; Left 65 deg Thomas test: B hip flexors appear tight  POSTURE: rounded shoulders, forward head,  decreased lumbar lordosis, decreased thoracic kyphosis, posterior pelvic tilt, and flexed trunk   PALPATION: TTP mid Thoracic paraspinals and lower lumbar paraspinals. Severely TTP along B gluts, piriformis-tight, B ITB TTP and  tight  LUMBAR ROM:   AROM eval  Flexion WFL  Extension Severely limited  Right lateral flexion Mildly limited, painful  Left lateral flexion Mildly limited, painful  Right rotation WFL  Left rotation WFL   (Blank rows = not tested)  LOWER EXTREMITY ROM:   WFL, but stiff except where noted.  Passive  Right eval Left eval  Hip flexion Mod limited Mod limited  Hip extension Mod limited Mod limited  Hip abduction    Hip adduction    Hip internal rotation Mild Mod  Hip external rotation Mod Mod  Knee flexion    Knee extension    Ankle dorsiflexion    Ankle plantarflexion    Ankle inversion    Ankle eversion      LOWER EXTREMITY MMT:  B hip ext 4-/5, otherwise 4+/5  LUMBAR SPECIAL TESTS:  Straight leg raise test: Negative and Slump test: Negative  FUNCTIONAL TESTS:  5 times sit to stand: 11.93 Functional gait assessment: 16 /30  GAIT: Distance walked: clinic distances Assistive device utilized: Single point cane and used first thing in the morning Level of assistance: Modified independence Comments: Flexed posture, shuffling with increased lateral sway and increased effort.  TODAY'S TREATMENT:                                                                                                                              DATE:   12/27/223 Nustep L6 x5 min 2x10 Sit to stand with med ball press (yellow) 2x10 pallof press 10lbs 2x10 step up with knee drive 4 inch step 2 minutes lumbar flexion stretching with ball 3 directions Adduction ball squeeze with TrA activation x15  Seated lumbar SNAGs with stretching strap x10   08/09/22  Nustep L 5 6 min Black trunk flex and ext 2 sets 10 seated Supine feet on ball bridge, KTC and obl 2 sets 10   Isometric abdominals supine with ball 15 times hold 3 sec LE /trunk stretching supine. STW and stripping to Left IT, educated in having daughter use rolling pin at home for release and lye EOB in SL hanging leg off Red tband clams 2 sets 10 Hip flex red tband 2 sets 10 Add ball squeeze 15 x      08/07/22  Education Supine stretching- LTR, SKTC, active HS, figure 4, gluts   PATIENT EDUCATION:  Education details: POC, HEP Person educated: Patient Education method: Consulting civil engineer, Media planner, and Handouts Education comprehension: verbalized understanding and returned demonstration  HOME EXERCISE PROGRAM:  [id number]  ASSESSMENT:  CLINICAL IMPRESSION: Patient presents to therapy moderate low back and LE pain reporting pain is worse on left than right. Patient was able to tolerate standing activities without increase in familiar pain. Patient was progressed with sit to stand with med ball overhead press, pallof press, and step up with knee drive to improve ease with transfers as well as tolerance to extension activities due to issues with neurogenic claudication. Patient demonstrates continued  forward flexed posture with all activities and requires mod verbal and tactile cueing to improve upright posture. Patient with continued weakness of bilateral LE and continues to report discomfort with left LE stance. Patient reports decrease in symptoms with use of lumbar SNAGs. Patient continues to require skilled therapy services to address deficits and improve functional mobility/independence.   OBJECTIVE IMPAIRMENTS: Abnormal gait, decreased activity tolerance, decreased balance, decreased coordination, decreased endurance, decreased mobility, difficulty walking, decreased ROM, decreased strength, increased muscle spasms, impaired flexibility, improper body mechanics, postural dysfunction, and pain.   ACTIVITY LIMITATIONS: carrying, lifting, bending, sitting, standing, squatting, sleeping, stairs,  and locomotion level  PARTICIPATION LIMITATIONS: cleaning, shopping, and community activity  PERSONAL FACTORS: Age, Past/current experiences, and 1 comorbidity: multiple back surgeries  are also affecting patient's functional outcome.   REHAB POTENTIAL: Good  CLINICAL DECISION MAKING: Stable/uncomplicated  EVALUATION COMPLEXITY: Low   GOALS: Goals reviewed with patient? Yes  SHORT TERM GOALS: Target date: 08/24/22  I with initial HEP Baseline: Goal status: INITIAL  LONG TERM GOALS: Target date: 10/16/22  I with final HEP Baseline:  Goal status: INITIAL  2.  Patient will report decreased back pain by 50% Baseline:  Goal status: INITIAL  3.  Improve FGA score to at least 24 to demonstrate improved balance. Baseline: 16 Goal status: INITIAL  4.  Patient will walk at least 300' on level and unlevel surfaces with pain < 4/10, no unsteadiness, I. Baseline:  Goal status: INITIAL  5.  Up and down 14 steps using U rail and step over step technique. Baseline:  Goal status: INITIAL PLAN:  PT FREQUENCY: 2x/week  PT DURATION: 10 weeks  PLANNED INTERVENTIONS: Therapeutic exercises, Therapeutic activity, Neuromuscular re-education, Balance training, Gait training, Patient/Family education, Self Care, Joint mobilization, Stair training, Dry Needling, Electrical stimulation, Cryotherapy, Moist heat, Ionotophoresis 36m/ml Dexamethasone, and Manual therapy.  PLAN FOR NEXT SESSION: Update HEP with strengthening, spinal mobility exercises.    Encounter Date: 08/15/2022   AAwilda BillDiy, PT 08/15/2022, 1:19 PM  [place] [address]Phone: [phone]  Fax:  [phone]

## 2022-08-17 ENCOUNTER — Ambulatory Visit: Payer: Medicare Other

## 2022-08-17 DIAGNOSIS — G8929 Other chronic pain: Secondary | ICD-10-CM

## 2022-08-17 DIAGNOSIS — M6281 Muscle weakness (generalized): Secondary | ICD-10-CM | POA: Diagnosis not present

## 2022-08-17 DIAGNOSIS — R293 Abnormal posture: Secondary | ICD-10-CM | POA: Diagnosis not present

## 2022-08-17 DIAGNOSIS — M5442 Lumbago with sciatica, left side: Secondary | ICD-10-CM | POA: Diagnosis not present

## 2022-08-17 DIAGNOSIS — R2681 Unsteadiness on feet: Secondary | ICD-10-CM

## 2022-08-17 DIAGNOSIS — R262 Difficulty in walking, not elsewhere classified: Secondary | ICD-10-CM | POA: Diagnosis not present

## 2022-08-17 DIAGNOSIS — R278 Other lack of coordination: Secondary | ICD-10-CM | POA: Diagnosis not present

## 2022-08-17 NOTE — Therapy (Signed)
OUTPATIENT PHYSICAL THERAPY THORACOLUMBAR   Patient Name: Joy [name] MRN: [id number] DOB:12/04/1940, 81 y.o., female Today's Date: 08/17/2022  END OF SESSION:  PT End of Session - 08/17/22 0929     Visit Number 4    Date for PT Re-Evaluation 10/16/22    PT Start Time 0929    PT Stop Time 1010    PT Time Calculation (min) 41 min    Activity Tolerance Patient tolerated treatment well    Behavior During Therapy [facility] for tasks assessed/performed               Past Medical History:  Diagnosis Date   Ankylosing spondylitis ([place])    Anxiety    Arthritis    RHEUMATOID   Back pain    Bronchitis    Constipation    COPD (chronic obstructive pulmonary disease) ([place])    CXR 01/11/18 showed mild COPD and chronic bronchitis   CTS (carpal tunnel syndrome)    Dry eye    Dry mouth    Dysrhythmia    "irregularity" unknown at this time - being evaluated by cardiology   Essential hypertension 11/24/2015   GERD (gastroesophageal reflux disease)    HLA B27 (HLA B27 positive)    Hypercholesteremia    Hyperlipidemia 11/24/2015   Hypertension    IBS (irritable bowel syndrome)    Joint pain    Neuropathy    OAB (overactive bladder)    Obesity    Osteoarthritis    Osteoporosis    Pre-diabetes    Rheumatoid arthritis ([place]) 11/24/2015   Sciatica    Seasonal allergies    Spinal stenosis    Past Surgical History:  Procedure Laterality Date   ABDOMINAL HYSTERECTOMY  1995   BACK SURGERY     BREAST SURGERY     REDUCTION   CATARACT EXTRACTION W/PHACO  08/01/2012   Procedure: CATARACT EXTRACTION PHACO AND INTRAOCULAR LENS PLACEMENT ([place]);  Surgeon: Marylynn Pearson, MD;  Location: [facility];  Service: Ophthalmology;  Laterality: Right;   EYE SURGERY Bilateral    cataract   HERNIA REPAIR     RIGHT ING.   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 10/10/2021   Constipation 10/10/2021   Numbness of lower limb 10/10/2021   Thrush 10/10/2021   Sacroiliac joint pain 10/10/2021   Paraparesis ([place]) 10/10/2021   Pseudomonas aeruginosa colonization 10/02/2021   Medication monitoring encounter 09/15/2021   Pain of left hip joint 08/10/2020   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   Prediabetes 02/01/2020   Body mass index (BMI) 36.0-36.9, adult 02/01/2020   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   Inflammation of sacroiliac joint ([place]) 10/06/2018   Chronic bronchitis ([place]) 09/25/2018   Mucopurulent chronic bronchitis ([place]) 08/21/2018   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   Spondylosis 06/13/2018   Status post lumbar spinal fusion 06/13/2018   Status post lumbar spine surgery for decompression of spinal cord 06/13/2018   History of total knee replacement, 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   History of total knee arthroplasty, right 09/20/2017   OA (osteoarthritis) of knee 12/26/2015   Essential hypertension 11/24/2015  Hyperlipidemia 11/24/2015   Rheumatoid arthritis ([place]) 11/24/2015   Spinal stenosis of lumbar region 08/24/2014   Lumbar spondylosis 07/21/2014    PCP: Marda Stalker, PA-C  REFERRING PROVIDER: Ashok Pall, MD  REFERRING DIAG: [phone] (ICD-10-CM) - Spinal stenosis, lumbar region with neurogenic claudication   Rationale for Evaluation and Treatment: Rehabilitation  THERAPY DIAG:  Chronic bilateral low back pain with bilateral sciatica  Difficulty in walking, not elsewhere classified  Unsteadiness on feet  Muscle weakness (generalized)  ONSET DATE: 08/03/22  SUBJECTIVE:                                                                                                                                                                                            SUBJECTIVE STATEMENT: Patient states that after last session she felt pretty good. States that after a day or so her pain did come back a little. Patient also states this morning she had a harder time standing up out of her chair.  PERTINENT HISTORY:  Thoraco-Lumbar fusion T11-L3 Persistent pain in back and L leg RA BTKR  PAIN:  Are you having pain? 1/10 back  PRECAUTIONS: None  WEIGHT BEARING RESTRICTIONS: No  FALLS:  Has patient fallen in last 6 months? No  LIVING ENVIRONMENT: Lives with: lives with their family Lives in: House/apartment Stairs: Yes: Internal: 14 steps; on left going up and External: 1 steps; none Has following equipment at home: Single point cane, Walker - 2 wheeled, Environmental consultant - 4 wheeled, and Grab bars  OCCUPATION: N/A  PLOF: Independent  PATIENT GOALS: Relieve her pain  NEXT MD VISIT: About 6 weeks  OBJECTIVE:   DIAGNOSTIC FINDINGS:  Lumbar MRI 1. Prior thoracolumbar fusion as detailed above. Unchanged lucency about the T11, L3, and S1 screws associated with absent solid arthrodesis at T11-12, L2-3, and L5-S1. 2. Suspected chronic severe left and moderate right neural foraminal stenosis at L5-S1. 3. Mild-to-moderate neural foraminal stenosis at L2-3. 4. Widely patent lumbar spinal canal following decompression. 5. Mild thoracic disc degeneration and advanced upper thoracic facet arthrosis without significant stenosis. 6.  Aortic Atherosclerosis (ICD10-I70.0).  SCREENING FOR RED FLAGS: Bowel or bladder incontinence: No Spinal tumors: No Cauda equina syndrome: No Compression fracture: No Abdominal aneurysm: No  COGNITION: Overall cognitive status: Within functional limits for tasks assessed     SENSATION: Not tested  MUSCLE LENGTH: Hamstrings: Right 50 deg; Left 65 deg Thomas test: B hip flexors appear  tight  POSTURE: rounded shoulders, forward head, decreased lumbar lordosis, decreased thoracic kyphosis, posterior pelvic tilt, and flexed trunk   PALPATION: TTP mid Thoracic paraspinals and lower lumbar paraspinals. Severely TTP along B gluts,  piriformis-tight, B ITB TTP and tight  LUMBAR ROM:   AROM eval  Flexion WFL  Extension Severely limited  Right lateral flexion Mildly limited, painful  Left lateral flexion Mildly limited, painful  Right rotation WFL  Left rotation WFL   (Blank rows = not tested)  LOWER EXTREMITY ROM:   WFL, but stiff except where noted.  Passive  Right eval Left eval  Hip flexion Mod limited Mod limited  Hip extension Mod limited Mod limited  Hip abduction    Hip adduction    Hip internal rotation Mild Mod  Hip external rotation Mod Mod  Knee flexion    Knee extension    Ankle dorsiflexion    Ankle plantarflexion    Ankle inversion    Ankle eversion      LOWER EXTREMITY MMT:  B hip ext 4-/5, otherwise 4+/5  LUMBAR SPECIAL TESTS:  Straight leg raise test: Negative and Slump test: Negative  FUNCTIONAL TESTS:  5 times sit to stand: 11.93 Functional gait assessment: 16 /30  GAIT: Distance walked: clinic distances Assistive device utilized: Single point cane and used first thing in the morning Level of assistance: Modified independence Comments: Flexed posture, shuffling with increased lateral sway and increased effort.  TODAY'S TREATMENT:                                                                                                                              DATE:   08/17/2022 Nustep L5 x5 min 15 reps med ball lift and chop with yellow ball 4 laps around gym farmer carry with 8lbs 3x10 HS curl with heel dig on physioball Lumbar rolling 3 directions x2 minutes 2x10 standing hip abduction with 2lbs ankle weights 2x10 pallof press 10lbs 3x10 rows 10lbs  08/15/2022 Nustep L6 x5 min 2x10 Sit to stand with med ball press  (yellow) 2x10 pallof press 10lbs 2x10 step up with knee drive 4 inch step 2 minutes lumbar flexion stretching with ball 3 directions Adduction ball squeeze with TrA activation x15  Seated lumbar SNAGs with stretching strap x10   08/09/22  Nustep L 5 6 min Black trunk flex and ext 2 sets 10 seated Supine feet on ball bridge, KTC and obl 2 sets 10  Isometric abdominals supine with ball 15 times hold 3 sec LE /trunk stretching supine. STW and stripping to Left IT, educated in having daughter use rolling pin at home for release and lye EOB in SL hanging leg off Red tband clams 2 sets 10 Hip flex red tband 2 sets 10 Add ball squeeze 15 x      08/07/22  Education Supine stretching- LTR, SKTC, active HS, figure 4, gluts   PATIENT EDUCATION:  Education details: POC, HEP Person educated: Patient Education method: Consulting civil engineer, Media planner, and Handouts Education comprehension: verbalized understanding and returned demonstration  HOME EXERCISE PROGRAM:  [id number]  ASSESSMENT:  CLINICAL IMPRESSION: Patient presents to therapy with mild pain of lumbar spine at rest. Patient  progressed with med ball lifts/chops and farmer carries to improve tolerance to ADLs and community based activities. Requires frequent verbal and tactile cueing with med ball lifts/chops to squat and bend knees to decrease excessive lumbar flexion and decrease pain in lumbar spine with functional lifting. Decreased activity tolerance noted with farmer carries requiring seated rest break after 2 laps and demonstrates increased forward flexion and difficulty maintaining upright posture as she fatigues. Patient continues to require skilled therapy services to address deficits and improve functional mobility/independence.   OBJECTIVE IMPAIRMENTS: Abnormal gait, decreased activity tolerance, decreased balance, decreased coordination, decreased endurance, decreased mobility, difficulty walking, decreased ROM, decreased  strength, increased muscle spasms, impaired flexibility, improper body mechanics, postural dysfunction, and pain.   ACTIVITY LIMITATIONS: carrying, lifting, bending, sitting, standing, squatting, sleeping, stairs, and locomotion level  PARTICIPATION LIMITATIONS: cleaning, shopping, and community activity  PERSONAL FACTORS: Age, Past/current experiences, and 1 comorbidity: multiple back surgeries  are also affecting patient's functional outcome.   REHAB POTENTIAL: Good  CLINICAL DECISION MAKING: Stable/uncomplicated  EVALUATION COMPLEXITY: Low   GOALS: Goals reviewed with patient? Yes  SHORT TERM GOALS: Target date: 08/24/22  I with initial HEP Baseline: Goal status: INITIAL  LONG TERM GOALS: Target date: 10/16/22  I with final HEP Baseline:  Goal status: INITIAL  2.  Patient will report decreased back pain by 50% Baseline:  Goal status: INITIAL  3.  Improve FGA score to at least 24 to demonstrate improved balance. Baseline: 16 Goal status: INITIAL  4.  Patient will walk at least 300' on level and unlevel surfaces with pain < 4/10, no unsteadiness, I. Baseline:  Goal status: INITIAL  5.  Up and down 14 steps using U rail and step over step technique. Baseline:  Goal status: INITIAL PLAN:  PT FREQUENCY: 2x/week  PT DURATION: 10 weeks  PLANNED INTERVENTIONS: Therapeutic exercises, Therapeutic activity, Neuromuscular re-education, Balance training, Gait training, Patient/Family education, Self Care, Joint mobilization, Stair training, Dry Needling, Electrical stimulation, Cryotherapy, Moist heat, Ionotophoresis 39m/ml Dexamethasone, and Manual therapy.  PLAN FOR NEXT SESSION: Update HEP with strengthening, spinal mobility exercises.    Encounter Date: 08/17/2022   AAwilda BillDiy, PT 08/17/2022, 10:12 AM  [place] [address]Phone: [phone]  Fax:  [phone]

## 2022-08-18 ENCOUNTER — Other Ambulatory Visit: Payer: Self-pay | Admitting: Cardiovascular Disease

## 2022-08-18 DIAGNOSIS — I10 Essential (primary) hypertension: Secondary | ICD-10-CM

## 2022-08-21 ENCOUNTER — Other Ambulatory Visit: Payer: Self-pay | Admitting: Cardiovascular Disease

## 2022-08-21 ENCOUNTER — Ambulatory Visit: Payer: Medicare Other | Attending: Neurosurgery | Admitting: Physical Therapy

## 2022-08-21 DIAGNOSIS — M5442 Lumbago with sciatica, left side: Secondary | ICD-10-CM | POA: Diagnosis not present

## 2022-08-21 DIAGNOSIS — R278 Other lack of coordination: Secondary | ICD-10-CM | POA: Diagnosis not present

## 2022-08-21 DIAGNOSIS — G8929 Other chronic pain: Secondary | ICD-10-CM

## 2022-08-21 DIAGNOSIS — R2681 Unsteadiness on feet: Secondary | ICD-10-CM

## 2022-08-21 DIAGNOSIS — R279 Unspecified lack of coordination: Secondary | ICD-10-CM | POA: Insufficient documentation

## 2022-08-21 DIAGNOSIS — M5441 Lumbago with sciatica, right side: Secondary | ICD-10-CM | POA: Diagnosis not present

## 2022-08-21 DIAGNOSIS — R262 Difficulty in walking, not elsewhere classified: Secondary | ICD-10-CM

## 2022-08-21 DIAGNOSIS — M6281 Muscle weakness (generalized): Secondary | ICD-10-CM | POA: Diagnosis not present

## 2022-08-21 DIAGNOSIS — R293 Abnormal posture: Secondary | ICD-10-CM | POA: Diagnosis not present

## 2022-08-21 DIAGNOSIS — I10 Essential (primary) hypertension: Secondary | ICD-10-CM

## 2022-08-21 NOTE — Telephone Encounter (Signed)
Rx request sent to pharmacy.  

## 2022-08-21 NOTE — Therapy (Signed)
OUTPATIENT PHYSICAL THERAPY THORACOLUMBAR   Patient Name: Joy [name] MRN: [id number] DOB:[birth year], 82 y.o., female Today's Date: 08/21/2022  END OF SESSION:  PT End of Session - 08/21/22 0926     Visit Number 5    Date for PT Re-Evaluation 10/16/22    PT Start Time 0928    PT Stop Time 1012    PT Time Calculation (min) 44 min               Past Medical History:  Diagnosis Date   Ankylosing spondylitis ([place])    Anxiety    Arthritis    RHEUMATOID   Back pain    Bronchitis    Constipation    COPD (chronic obstructive pulmonary disease) ([place])    CXR 01/11/18 showed mild COPD and chronic bronchitis   CTS (carpal tunnel syndrome)    Dry eye    Dry mouth    Dysrhythmia    "irregularity" unknown at this time - being evaluated by cardiology   Essential hypertension 11/24/2015   GERD (gastroesophageal reflux disease)    HLA B27 (HLA B27 positive)    Hypercholesteremia    Hyperlipidemia 11/24/2015   Hypertension    IBS (irritable bowel syndrome)    Joint pain    Neuropathy    OAB (overactive bladder)    Obesity    Osteoarthritis    Osteoporosis    Pre-diabetes    Rheumatoid arthritis ([place]) 11/24/2015   Sciatica    Seasonal allergies    Spinal stenosis    Past Surgical History:  Procedure Laterality Date   ABDOMINAL HYSTERECTOMY  1995   BACK SURGERY     BREAST SURGERY     REDUCTION   CATARACT EXTRACTION W/PHACO  08/01/2012   Procedure: CATARACT EXTRACTION PHACO AND INTRAOCULAR LENS PLACEMENT ([place]);  Surgeon: Marylynn Pearson, MD;  Location: [place] [place];  Service: Ophthalmology;  Laterality: Right;   EYE SURGERY Bilateral    cataract   HERNIA REPAIR     RIGHT ING.   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 10/10/2021   Constipation 10/10/2021   Numbness of lower limb 10/10/2021   Thrush 10/10/2021   Sacroiliac joint pain 10/10/2021   Paraparesis ([place]) 10/10/2021   Pseudomonas aeruginosa colonization 10/02/2021   Medication monitoring encounter 09/15/2021   Pain of left hip joint 08/10/2020   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   Prediabetes 02/01/2020   Body mass index (BMI) 36.0-36.9, adult 02/01/2020   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   Inflammation of sacroiliac joint ([place]) 10/06/2018   Chronic bronchitis ([place]) 09/25/2018   Mucopurulent chronic bronchitis ([place]) 08/21/2018   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   Spondylosis 06/13/2018   Status post lumbar spinal fusion 06/13/2018   Status post lumbar spine surgery for decompression of spinal cord 06/13/2018   History of total knee replacement, 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   History of total knee arthroplasty, right 09/20/2017   OA (osteoarthritis) of knee 12/26/2015   Essential hypertension 11/24/2015   Hyperlipidemia 11/24/2015   Rheumatoid arthritis ([place]) 11/24/2015   Spinal stenosis of lumbar region 08/24/2014  Lumbar spondylosis 07/21/2014    PCP: Marda Stalker, PA-C  REFERRING PROVIDER: Ashok Pall, MD  REFERRING DIAG: [phone] (ICD-10-CM) - Spinal stenosis, lumbar region with neurogenic claudication   Rationale for Evaluation and Treatment: Rehabilitation  THERAPY DIAG:  Chronic bilateral low back pain with bilateral sciatica  Difficulty in walking, not elsewhere classified  Unsteadiness on feet  Muscle weakness (generalized)  ONSET DATE: 08/03/22  SUBJECTIVE:                                                                                                                                                                                            SUBJECTIVE STATEMENT: no pain now but earlier this morning pain coming and going in RT hip/upper leg which is unusual    PERTINENT HISTORY:  Thoraco-Lumbar fusion T11-L3 Persistent pain in back and L leg RA BTKR  PAIN:  Are you having pain? 0/10  PRECAUTIONS: None  WEIGHT BEARING RESTRICTIONS: No  FALLS:  Has patient fallen in last 6 months? No  LIVING ENVIRONMENT: Lives with: lives with their family Lives in: House/apartment Stairs: Yes: Internal: 14 steps; on left going up and External: 1 steps; none Has following equipment at home: Single point cane, Walker - 2 wheeled, Environmental consultant - 4 wheeled, and Grab bars  OCCUPATION: N/A  PLOF: Independent  PATIENT GOALS: Relieve her pain  NEXT MD VISIT: About 6 weeks  OBJECTIVE:   DIAGNOSTIC FINDINGS:  Lumbar MRI 1. Prior thoracolumbar fusion as detailed above. Unchanged lucency about the T11, L3, and S1 screws associated with absent solid arthrodesis at T11-12, L2-3, and L5-S1. 2. Suspected chronic severe left and moderate right neural foraminal stenosis at L5-S1. 3. Mild-to-moderate neural foraminal stenosis at L2-3. 4. Widely patent lumbar spinal canal following decompression. 5. Mild thoracic disc degeneration and advanced upper thoracic facet arthrosis without significant stenosis. 6.  Aortic Atherosclerosis (ICD10-I70.0).  SCREENING FOR RED FLAGS: Bowel or bladder incontinence: No Spinal tumors: No Cauda equina syndrome: No Compression fracture: No Abdominal aneurysm: No  COGNITION: Overall cognitive status: Within functional limits for tasks assessed     SENSATION: Not tested  MUSCLE LENGTH: Hamstrings: Right 50 deg; Left 65 deg Thomas test: B hip flexors appear tight  POSTURE: rounded shoulders, forward head, decreased lumbar lordosis, decreased thoracic kyphosis, posterior pelvic tilt, and flexed trunk   PALPATION: TTP mid Thoracic paraspinals and lower lumbar paraspinals. Severely TTP along B  gluts, piriformis-tight, B ITB TTP and tight  LUMBAR ROM:   AROM eval  Flexion WFL  Extension Severely limited  Right lateral flexion Mildly limited, painful  Left lateral flexion Mildly limited, painful  Right rotation [facility]  Left rotation WFL   (Blank rows = not tested)  LOWER EXTREMITY ROM:   WFL, but stiff except where noted.  Passive  Right eval Left eval  Hip flexion Mod limited Mod limited  Hip extension Mod limited Mod limited  Hip abduction    Hip adduction    Hip internal rotation Mild Mod  Hip external rotation Mod Mod  Knee flexion    Knee extension    Ankle dorsiflexion    Ankle plantarflexion    Ankle inversion    Ankle eversion      LOWER EXTREMITY MMT:  B hip ext 4-/5, otherwise 4+/5  LUMBAR SPECIAL TESTS:  Straight leg raise test: Negative and Slump test: Negative  FUNCTIONAL TESTS:  5 times sit to stand: 11.93 Functional gait assessment: 16 /30  GAIT: Distance walked: clinic distances Assistive device utilized: Single point cane and used first thing in the morning Level of assistance: Modified independence Comments: Flexed posture, shuffling with increased lateral sway and increased effort.  TODAY'S TREATMENT:                                                                                                                              DATE:   08/21/22 Nustep L 5 48mn 6 inch step up with opp leg ext and then laterally with opp leg abd ( BIL UE support) Wt ball ball ext 10x then rotation 10 x each STS with wt ball press 10 x Seated row and lats 20# 2 sets 10 Black tband trunk ext 2 sets 10 Green tband hip flex ,abd and clams 2 sets 10 Green tband LAQ 2 sets 10       08/17/2022 Nustep L5 x5 min 15 reps med ball lift and chop with yellow ball 4 laps around gym farmer carry with 8lbs 3x10 HS curl with heel dig on physioball Lumbar rolling 3 directions x2 minutes 2x10 standing hip abduction with 2lbs ankle weights 2x10 pallof press  10lbs 3x10 rows 10lbs  08/15/2022 Nustep L6 x5 min 2x10 Sit to stand with med ball press (yellow) 2x10 pallof press 10lbs 2x10 step up with knee drive 4 inch step 2 minutes lumbar flexion stretching with ball 3 directions Adduction ball squeeze with TrA activation x15  Seated lumbar SNAGs with stretching strap x10   08/09/22  Nustep L 5 6 min Black trunk flex and ext 2 sets 10 seated Supine feet on ball bridge, KTC and obl 2 sets 10  Isometric abdominals supine with ball 15 times hold 3 sec LE /trunk stretching supine. STW and stripping to Left IT, educated in having daughter use rolling pin at home for release and lye EOB in SL hanging leg off Red tband clams 2 sets 10 Hip flex red tband 2 sets 10 Add ball squeeze 15 x      08/07/22  Education Supine stretching- LTR, SKTC, active HS, figure 4, gluts   PATIENT EDUCATION:  Education details: POC, HEP Person  educated: Patient Education method: Explanation, Demonstration, and Handouts Education comprehension: verbalized understanding and returned demonstration  HOME EXERCISE PROGRAM:  [id number]  ASSESSMENT:  CLINICAL IMPRESSION: Pt arrives without pain but states in the early morning RT hip/thigh pian coming and goes, she states it is usually left, RT leg weaker per pt. Pt tolerated session well but did need cuing to work on good upright posture as she tends to be in fwd trunk flexion esp as she fatigues. STG met OBJECTIVE IMPAIRMENTS: Abnormal gait, decreased activity tolerance, decreased balance, decreased coordination, decreased endurance, decreased mobility, difficulty walking, decreased ROM, decreased strength, increased muscle spasms, impaired flexibility, improper body mechanics, postural dysfunction, and pain.   ACTIVITY LIMITATIONS: carrying, lifting, bending, sitting, standing, squatting, sleeping, stairs, and locomotion level  PARTICIPATION LIMITATIONS: cleaning, shopping, and community activity  PERSONAL  FACTORS: Age, Past/current experiences, and 1 comorbidity: multiple back surgeries  are also affecting patient's functional outcome.   REHAB POTENTIAL: Good  CLINICAL DECISION MAKING: Stable/uncomplicated  EVALUATION COMPLEXITY: Low   GOALS: Goals reviewed with patient? Yes  SHORT TERM GOALS: Target date: 08/24/22  I with initial HEP Baseline: Goal status: met 08/21/22  LONG TERM GOALS: Target date: 10/16/22  I with final HEP Baseline:  Goal status: INITIAL  2.  Patient will report decreased back pain by 50% Baseline:  Goal status: INITIAL  3.  Improve FGA score to at least 24 to demonstrate improved balance. Baseline: 16 Goal status: INITIAL  4.  Patient will walk at least 300' on level and unlevel surfaces with pain < 4/10, no unsteadiness, I. Baseline:  Goal status: INITIAL  5.  Up and down 14 steps using U rail and step over step technique. Baseline:  Goal status: INITIAL PLAN:  PT FREQUENCY: 2x/week  PT DURATION: 10 weeks  PLANNED INTERVENTIONS: Therapeutic exercises, Therapeutic activity, Neuromuscular re-education, Balance training, Gait training, Patient/Family education, Self Care, Joint mobilization, Stair training, Dry Needling, Electrical stimulation, Cryotherapy, Moist heat, Ionotophoresis 24m/ml Dexamethasone, and Manual therapy.  PLAN FOR NEXT SESSION: Update HEP with strengthening, spinal mobility exercises.    Encounter Date: 08/21/2022   PVickii Penna1/09/2022, 9:26 AM  [place] [address]Phone: [phone]  Fax:  [address] [address]Phone: [phone]  Fax:  [phone] Patient Details  Name: CGINNIFER CREELMANMRN: [id number] of Birth: [birth year]Referring Provider:  WMarda Stalker PA-C  Encounter Date: 08/21/2022   PLaqueta Carina  PTA 08/21/2022, 9:26 AM  [place] [address]Phone: [phone]  Fax:  [phone]

## 2022-08-24 ENCOUNTER — Ambulatory Visit: Payer: Medicare Other | Admitting: Physical Therapy

## 2022-08-24 DIAGNOSIS — M5442 Lumbago with sciatica, left side: Secondary | ICD-10-CM | POA: Diagnosis not present

## 2022-08-24 DIAGNOSIS — M6281 Muscle weakness (generalized): Secondary | ICD-10-CM

## 2022-08-24 DIAGNOSIS — G8929 Other chronic pain: Secondary | ICD-10-CM

## 2022-08-24 DIAGNOSIS — R262 Difficulty in walking, not elsewhere classified: Secondary | ICD-10-CM | POA: Diagnosis not present

## 2022-08-24 DIAGNOSIS — M5441 Lumbago with sciatica, right side: Secondary | ICD-10-CM | POA: Diagnosis not present

## 2022-08-24 DIAGNOSIS — R2681 Unsteadiness on feet: Secondary | ICD-10-CM | POA: Diagnosis not present

## 2022-08-24 NOTE — Therapy (Signed)
OUTPATIENT PHYSICAL THERAPY THORACOLUMBAR   Patient Name: Joy [name] MRN: [id number] DOB:[birth year], 82 y.o., female Today's Date: 08/24/2022  END OF SESSION:  PT End of Session - 08/24/22 0935     Visit Number 6    Date for PT Re-Evaluation 10/16/22    PT Start Time 0930    PT Stop Time 1010    PT Time Calculation (min) 40 min               Past Medical History:  Diagnosis Date   Ankylosing spondylitis ([facility])    Anxiety    Arthritis    RHEUMATOID   Back pain    Bronchitis    Constipation    COPD (chronic obstructive pulmonary disease) ([facility])    CXR 01/11/18 showed mild COPD and chronic bronchitis   CTS (carpal tunnel syndrome)    Dry eye    Dry mouth    Dysrhythmia    "irregularity" unknown at this time - being evaluated by cardiology   Essential hypertension 11/24/2015   GERD (gastroesophageal reflux disease)    HLA B27 (HLA B27 positive)    Hypercholesteremia    Hyperlipidemia 11/24/2015   Hypertension    IBS (irritable bowel syndrome)    Joint pain    Neuropathy    OAB (overactive bladder)    Obesity    Osteoarthritis    Osteoporosis    Pre-diabetes    Rheumatoid arthritis ([facility]) 11/24/2015   Sciatica    Seasonal allergies    Spinal stenosis    Past Surgical History:  Procedure Laterality Date   ABDOMINAL HYSTERECTOMY  1995   BACK SURGERY     BREAST SURGERY     REDUCTION   CATARACT EXTRACTION W/PHACO  08/01/2012   Procedure: CATARACT EXTRACTION PHACO AND INTRAOCULAR LENS PLACEMENT ([facility]);  Surgeon: Chalmers Guest, MD;  Location: [facility];  Service: Ophthalmology;  Laterality: Right;   EYE SURGERY Bilateral    cataract   HERNIA REPAIR     RIGHT ING.   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 10/10/2021   Constipation 10/10/2021   Numbness of lower limb 10/10/2021   Thrush 10/10/2021   Sacroiliac joint pain 10/10/2021   Paraparesis ([facility]) 10/10/2021   Pseudomonas aeruginosa colonization 10/02/2021   Medication monitoring encounter 09/15/2021   Pain of left hip joint 08/10/2020   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   Prediabetes 02/01/2020   Body mass index (BMI) 36.0-36.9, adult 02/01/2020   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   Inflammation of sacroiliac joint ([facility]) 10/06/2018   Chronic bronchitis ([facility]) 09/25/2018   Mucopurulent chronic bronchitis ([facility]) 08/21/2018   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   Spondylosis 06/13/2018   Status post lumbar spinal fusion 06/13/2018   Status post lumbar spine surgery for decompression of spinal cord 06/13/2018   History of total knee replacement, 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   History of total knee arthroplasty, right 09/20/2017   OA (osteoarthritis) of knee 12/26/2015   Essential hypertension 11/24/2015   Hyperlipidemia 11/24/2015   Rheumatoid arthritis ([facility]) 11/24/2015   Spinal stenosis of lumbar region 08/24/2014  Lumbar spondylosis 07/21/2014    PCP: Marda Stalker, PA-C  REFERRING PROVIDER: Ashok Pall, MD  REFERRING DIAG: [phone] (ICD-10-CM) - Spinal stenosis, lumbar region with neurogenic claudication   Rationale for Evaluation and Treatment: Rehabilitation  THERAPY DIAG:  Chronic bilateral low back pain with bilateral sciatica  Difficulty in walking, not elsewhere classified  Muscle weakness (generalized)  ONSET DATE: 08/03/22  SUBJECTIVE:                                                                                                                                                                                           SUBJECTIVE  STATEMENT: hurting all over this morning,maybe rain coming in PERTINENT HISTORY:  Thoraco-Lumbar fusion T11-L3 Persistent pain in back and L leg RA BTKR  PAIN:  Are you having pain? 6/10 legs and back  PRECAUTIONS: None  WEIGHT BEARING RESTRICTIONS: No  FALLS:  Has patient fallen in last 6 months? No  LIVING ENVIRONMENT: Lives with: lives with their family Lives in: House/apartment Stairs: Yes: Internal: 14 steps; on left going up and External: 1 steps; none Has following equipment at home: Single point cane, Walker - 2 wheeled, Environmental consultant - 4 wheeled, and Grab bars  OCCUPATION: N/A  PLOF: Independent  PATIENT GOALS: Relieve her pain  NEXT MD VISIT: About 6 weeks  OBJECTIVE:   DIAGNOSTIC FINDINGS:  Lumbar MRI 1. Prior thoracolumbar fusion as detailed above. Unchanged lucency about the T11, L3, and S1 screws associated with absent solid arthrodesis at T11-12, L2-3, and L5-S1. 2. Suspected chronic severe left and moderate right neural foraminal stenosis at L5-S1. 3. Mild-to-moderate neural foraminal stenosis at L2-3. 4. Widely patent lumbar spinal canal following decompression. 5. Mild thoracic disc degeneration and advanced upper thoracic facet arthrosis without significant stenosis. 6.  Aortic Atherosclerosis (ICD10-I70.0).  SCREENING FOR RED FLAGS: Bowel or bladder incontinence: No Spinal tumors: No Cauda equina syndrome: No Compression fracture: No Abdominal aneurysm: No  COGNITION: Overall cognitive status: Within functional limits for tasks assessed     SENSATION: Not tested  MUSCLE LENGTH: Hamstrings: Right 50 deg; Left 65 deg Thomas test: B hip flexors appear tight  POSTURE: rounded shoulders, forward head, decreased lumbar lordosis, decreased thoracic kyphosis, posterior pelvic tilt, and flexed trunk   PALPATION: TTP mid Thoracic paraspinals and lower lumbar paraspinals. Severely TTP along B gluts, piriformis-tight, B ITB TTP and tight  LUMBAR ROM:    AROM eval  Flexion WFL  Extension Severely limited  Right lateral flexion Mildly limited, painful  Left lateral flexion Mildly limited, painful  Right rotation WFL  Left rotation WFL   (Blank rows = not tested)  LOWER EXTREMITY ROM:  WFL, but stiff except where noted.  Passive  Right eval Left eval  Hip flexion Mod limited Mod limited  Hip extension Mod limited Mod limited  Hip abduction    Hip adduction    Hip internal rotation Mild Mod  Hip external rotation Mod Mod  Knee flexion    Knee extension    Ankle dorsiflexion    Ankle plantarflexion    Ankle inversion    Ankle eversion      LOWER EXTREMITY MMT:  B hip ext 4-/5, otherwise 4+/5  LUMBAR SPECIAL TESTS:  Straight leg raise test: Negative and Slump test: Negative  FUNCTIONAL TESTS:  5 times sit to stand: 11.93 Functional gait assessment: 16 /30  GAIT: Distance walked: clinic distances Assistive device utilized: Single point cane and used first thing in the morning Level of assistance: Modified independence Comments: Flexed posture, shuffling with increased lateral sway and increased effort.  TODAY'S TREATMENT:                                                                                                                              DATE:   08/24/22 PROM and stretching to LE and LB in supine on MH Feet on ball bridge 10 x, KTC and obl 10 each Isometric abdominals 15 x hold 3 sec Red tband clams and hip flex 2 sets 10 Add ball squeeze supine 15 x Sit fit 4 way pelvic ROM 15 x each Sit fit LAQ,hip flex and abd 10 each Nustep L 4 6 min  08/21/22 Nustep L 5 4min 6 inch step up with opp leg ext and then laterally with opp leg abd ( BIL UE support) Wt ball ball ext 10x then rotation 10 x each STS with wt ball press 10 x Seated row and lats 20# 2 sets 10 Black tband trunk ext 2 sets 10 Green tband hip flex ,abd and clams 2 sets 10 Green tband LAQ 2 sets 10       08/17/2022 Nustep L5 x5 min 15  reps med ball lift and chop with yellow ball 4 laps around gym farmer carry with 8lbs 3x10 HS curl with heel dig on physioball Lumbar rolling 3 directions x2 minutes 2x10 standing hip abduction with 2lbs ankle weights 2x10 pallof press 10lbs 3x10 rows 10lbs  08/15/2022 Nustep L6 x5 min 2x10 Sit to stand with med ball press (yellow) 2x10 pallof press 10lbs 2x10 step up with knee drive 4 inch step 2 minutes lumbar flexion stretching with ball 3 directions Adduction ball squeeze with TrA activation x15  Seated lumbar SNAGs with stretching strap x10   08/09/22  Nustep L 5 6 min Black trunk flex and ext 2 sets 10 seated Supine feet on ball bridge, KTC and obl 2 sets 10  Isometric abdominals supine with ball 15 times hold 3 sec LE /trunk stretching supine. STW and stripping to Left IT, educated in having daughter use rolling pin at home for release and  lye EOB in SL hanging leg off Red tband clams 2 sets 10 Hip flex red tband 2 sets 10 Add ball squeeze 15 x      08/07/22  Education Supine stretching- LTR, SKTC, active HS, figure 4, gluts   PATIENT EDUCATION:  Education details: POC, HEP Person educated: Patient Education method: Programmer, multimedia, Facilities manager, and Handouts Education comprehension: verbalized understanding and returned demonstration  HOME EXERCISE PROGRAM:  [id number]  ASSESSMENT:  CLINICAL IMPRESSION: pt arrived with increased pain and almost cancelled but as she was up longer pain decreased form 10+ to 6/10. Responded will to MH and stretching. Adjusted ex for tolerance level today with cuing to activate core  OBJECTIVE IMPAIRMENTS: Abnormal gait, decreased activity tolerance, decreased balance, decreased coordination, decreased endurance, decreased mobility, difficulty walking, decreased ROM, decreased strength, increased muscle spasms, impaired flexibility, improper body mechanics, postural dysfunction, and pain.   ACTIVITY LIMITATIONS: carrying, lifting,  bending, sitting, standing, squatting, sleeping, stairs, and locomotion level  PARTICIPATION LIMITATIONS: cleaning, shopping, and community activity  PERSONAL FACTORS: Age, Past/current experiences, and 1 comorbidity: multiple back surgeries  are also affecting patient's functional outcome.   REHAB POTENTIAL: Good  CLINICAL DECISION MAKING: Stable/uncomplicated  EVALUATION COMPLEXITY: Low   GOALS: Goals reviewed with patient? Yes  SHORT TERM GOALS: Target date: 08/24/22  I with initial HEP Baseline: Goal status: met 08/21/22  LONG TERM GOALS: Target date: 10/16/22  I with final HEP Baseline:  Goal status: INITIAL  2.  Patient will report decreased back pain by 50% Baseline:  Goal status: 08/24/22 ongoing  3.  Improve FGA score to at least 24 to demonstrate improved balance. Baseline: 16 Goal status: INITIAL  4.  Patient will walk at least 300' on level and unlevel surfaces with pain < 4/10, no unsteadiness, I. Baseline:  Goal status: on going 08/24/22  5.  Up and down 14 steps using U rail and step over step technique. Baseline:  Goal status: INITIAL PLAN:  PT FREQUENCY: 2x/week  PT DURATION: 10 weeks  PLANNED INTERVENTIONS: Therapeutic exercises, Therapeutic activity, Neuromuscular re-education, Balance training, Gait training, Patient/Family education, Self Care, Joint mobilization, Stair training, Dry Needling, Electrical stimulation, Cryotherapy, Moist heat, Ionotophoresis 4mg /ml Dexamethasone, and Manual therapy.  PLAN FOR NEXT SESSION: Update HEP with strengthening, spinal mobility exercises.    Encounter Date: 08/24/2022   10/23/2022 08/24/2022, 9:35 AM  [facility] [place] [address] [place], [place], [place] Phone: [phone]   Fax:  [phone] [place] [address] [place] [place], [place], [place] Phone: [phone]   Fax:  [phone]  Patient  Details  Name: Joy [name] MRN: Karle Starch Date of Birth: 04/16/1941 Referring Provider:  09/13/1940, PA-C  Encounter Date: 08/24/2022   10/23/2022, PTA 08/24/2022, 9:35 AM  [facility] [place] [address] [place], [place], [place] Phone: [phone]   Fax:  [phone] [place] [address] [place], [place], [place] Phone: [phone]   Fax:  [phone]  Patient Details  Name: Joy [name] MRN: Karle Starch Date of Birth: October 29, 1940 Referring Provider:  09/13/1940, PA-C  Encounter Date: 08/24/2022   10/23/2022, PTA 08/24/2022, 9:35 AM  [facility] [place] [address] [place], [place], [place] Phone: [phone]   Fax:  [phone]

## 2022-08-27 ENCOUNTER — Ambulatory Visit: Payer: Medicare Other | Admitting: Physical Therapy

## 2022-08-27 ENCOUNTER — Other Ambulatory Visit (HOSPITAL_BASED_OUTPATIENT_CLINIC_OR_DEPARTMENT_OTHER): Payer: Self-pay | Admitting: Cardiovascular Disease

## 2022-08-27 NOTE — Telephone Encounter (Signed)
Rx(s) sent to pharmacy electronically.  

## 2022-08-28 ENCOUNTER — Ambulatory Visit: Payer: Medicare Other | Admitting: Gastroenterology

## 2022-08-29 ENCOUNTER — Ambulatory Visit: Payer: Medicare Other | Admitting: Physical Therapy

## 2022-08-29 ENCOUNTER — Encounter: Payer: Self-pay | Admitting: Physical Therapy

## 2022-08-29 DIAGNOSIS — R2681 Unsteadiness on feet: Secondary | ICD-10-CM

## 2022-08-29 DIAGNOSIS — G629 Polyneuropathy, unspecified: Secondary | ICD-10-CM | POA: Diagnosis not present

## 2022-08-29 DIAGNOSIS — G8929 Other chronic pain: Secondary | ICD-10-CM | POA: Diagnosis not present

## 2022-08-29 DIAGNOSIS — Z6836 Body mass index (BMI) 36.0-36.9, adult: Secondary | ICD-10-CM | POA: Diagnosis not present

## 2022-08-29 DIAGNOSIS — R262 Difficulty in walking, not elsewhere classified: Secondary | ICD-10-CM | POA: Diagnosis not present

## 2022-08-29 DIAGNOSIS — M069 Rheumatoid arthritis, unspecified: Secondary | ICD-10-CM | POA: Diagnosis not present

## 2022-08-29 DIAGNOSIS — M5416 Radiculopathy, lumbar region: Secondary | ICD-10-CM | POA: Diagnosis not present

## 2022-08-29 DIAGNOSIS — R413 Other amnesia: Secondary | ICD-10-CM | POA: Diagnosis not present

## 2022-08-29 DIAGNOSIS — M5442 Lumbago with sciatica, left side: Secondary | ICD-10-CM | POA: Diagnosis not present

## 2022-08-29 DIAGNOSIS — M6281 Muscle weakness (generalized): Secondary | ICD-10-CM | POA: Diagnosis not present

## 2022-08-29 DIAGNOSIS — R278 Other lack of coordination: Secondary | ICD-10-CM

## 2022-08-29 DIAGNOSIS — R293 Abnormal posture: Secondary | ICD-10-CM

## 2022-08-29 DIAGNOSIS — M5441 Lumbago with sciatica, right side: Secondary | ICD-10-CM | POA: Diagnosis not present

## 2022-08-29 NOTE — Therapy (Signed)
OUTPATIENT PHYSICAL THERAPY THORACOLUMBAR   Patient Name: Joy [name] MRN: [id number] DOB:04-21-1941, 82 y.o., female Today's Date: 08/29/2022  END OF SESSION:  PT End of Session - 08/29/22 1010     Visit Number 7    Date for PT Re-Evaluation 10/16/22    PT Start Time 0931    PT Stop Time 4098    PT Time Calculation (min) 44 min    Activity Tolerance Patient tolerated treatment well    Behavior During Therapy [facility] for tasks assessed/performed               Past Medical History:  Diagnosis Date   Ankylosing spondylitis ([place])    Anxiety    Arthritis    RHEUMATOID   Back pain    Bronchitis    Constipation    COPD (chronic obstructive pulmonary disease) ([place])    CXR 01/11/18 showed mild COPD and chronic bronchitis   CTS (carpal tunnel syndrome)    Dry eye    Dry mouth    Dysrhythmia    "irregularity" unknown at this time - being evaluated by cardiology   Essential hypertension 11/24/2015   GERD (gastroesophageal reflux disease)    HLA B27 (HLA B27 positive)    Hypercholesteremia    Hyperlipidemia 11/24/2015   Hypertension    IBS (irritable bowel syndrome)    Joint pain    Neuropathy    OAB (overactive bladder)    Obesity    Osteoarthritis    Osteoporosis    Pre-diabetes    Rheumatoid arthritis ([place]) 11/24/2015   Sciatica    Seasonal allergies    Spinal stenosis    Past Surgical History:  Procedure Laterality Date   ABDOMINAL HYSTERECTOMY  1995   BACK SURGERY     BREAST SURGERY     REDUCTION   CATARACT EXTRACTION W/PHACO  08/01/2012   Procedure: CATARACT EXTRACTION PHACO AND INTRAOCULAR LENS PLACEMENT ([place]);  Surgeon: Marylynn Pearson, MD;  Location: [place] [facility];  Service: Ophthalmology;  Laterality: Right;   EYE SURGERY Bilateral    cataract   HERNIA REPAIR     RIGHT ING.   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/02/2021   Medication monitoring encounter 09/15/2021   Pain of left hip joint 08/10/2020   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   Prediabetes 02/01/2020   Body mass index (BMI) 36.0-36.9, adult 02/01/2020   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   Inflammation of sacroiliac joint ([place]) 10/06/2018   Chronic bronchitis ([place]) 09/25/2018   Mucopurulent chronic bronchitis ([place]) 08/21/2018   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   Spondylosis 06/13/2018   Status post lumbar spinal fusion 06/13/2018   Status post lumbar spine surgery for decompression of spinal cord 06/13/2018   History of total knee replacement, 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   History of total knee arthroplasty, right 09/20/2017   OA (osteoarthritis) of knee 12/26/2015   Essential hypertension 11/24/2015  Hyperlipidemia 11/24/2015   Rheumatoid arthritis ([place]) 11/24/2015   Spinal stenosis of lumbar region 08/24/2014   Lumbar spondylosis 07/21/2014    PCP: Marda Stalker, PA-C  REFERRING PROVIDER: Ashok Pall, MD  REFERRING DIAG: [phone] (ICD-10-CM) - Spinal stenosis, lumbar region with neurogenic claudication   Rationale for Evaluation and Treatment: Rehabilitation  THERAPY DIAG:  Chronic bilateral low back pain with bilateral sciatica  Difficulty in walking, not elsewhere classified  Muscle weakness (generalized)  Other lack of coordination  Abnormal posture  Unsteadiness on feet  ONSET DATE: 08/03/22  SUBJECTIVE:                                                                                                                                                                                            SUBJECTIVE STATEMENT: Patient reports that her pain has continued to be severe. She cannot lie on her R side. Tries using heat and/or creams.  PERTINENT HISTORY:  Thoraco-Lumbar fusion T11-L3 Persistent pain in back and L leg RA BTKR  PAIN:  Are you having pain? 6/10 legs and back  PRECAUTIONS: None  WEIGHT BEARING RESTRICTIONS: No  FALLS:  Has patient fallen in last 6 months? No  LIVING ENVIRONMENT: Lives with: lives with their family Lives in: House/apartment Stairs: Yes: Internal: 14 steps; on left going up and External: 1 steps; none Has following equipment at home: Single point cane, Walker - 2 wheeled, Environmental consultant - 4 wheeled, and Grab bars  OCCUPATION: N/A  PLOF: Independent  PATIENT GOALS: Relieve her pain  NEXT MD VISIT: About 6 weeks  OBJECTIVE:   DIAGNOSTIC FINDINGS:  Lumbar MRI 1. Prior thoracolumbar fusion as detailed above. Unchanged lucency about the T11, L3, and S1 screws associated with absent solid arthrodesis at T11-12, L2-3, and L5-S1. 2. Suspected chronic severe left and moderate right neural foraminal stenosis at L5-S1. 3. Mild-to-moderate neural foraminal stenosis at L2-3. 4. Widely patent lumbar spinal canal following decompression. 5. Mild thoracic disc degeneration and advanced upper thoracic facet arthrosis without significant stenosis. 6.  Aortic Atherosclerosis (ICD10-I70.0).  SCREENING FOR RED FLAGS: Bowel or bladder incontinence: No Spinal tumors: No Cauda equina syndrome: No Compression fracture: No Abdominal aneurysm: No  COGNITION: Overall cognitive status: Within functional limits for tasks assessed     SENSATION: Not tested  MUSCLE LENGTH: Hamstrings: Right 50 deg; Left 65 deg Thomas test: B hip flexors appear tight  POSTURE: rounded shoulders, forward  head, decreased lumbar lordosis, decreased thoracic kyphosis, posterior pelvic tilt, and flexed trunk   PALPATION: TTP mid Thoracic paraspinals and lower lumbar paraspinals. Severely TTP along B gluts, piriformis-tight, B ITB TTP and tight  LUMBAR  ROM:   AROM eval  Flexion WFL  Extension Severely limited  Right lateral flexion Mildly limited, painful  Left lateral flexion Mildly limited, painful  Right rotation WFL  Left rotation WFL   (Blank rows = not tested)  LOWER EXTREMITY ROM:   WFL, but stiff except where noted.  Passive  Right eval Left eval  Hip flexion Mod limited Mod limited  Hip extension Mod limited Mod limited  Hip abduction    Hip adduction    Hip internal rotation Mild Mod  Hip external rotation Mod Mod  Knee flexion    Knee extension    Ankle dorsiflexion    Ankle plantarflexion    Ankle inversion    Ankle eversion      LOWER EXTREMITY MMT:  B hip ext 4-/5, otherwise 4+/5  LUMBAR SPECIAL TESTS:  Straight leg raise test: Negative and Slump test: Negative  FUNCTIONAL TESTS:  5 times sit to stand: 11.93 Functional gait assessment: 16 /30  GAIT: Distance walked: clinic distances Assistive device utilized: Single point cane and used first thing in the morning Level of assistance: Modified independence Comments: Flexed posture, shuffling with increased lateral sway and increased effort.  TODAY'S TREATMENT:                                                                                                                              DATE:  08/29/22 Deep STM to L piriformis and ITB followed by stretch Supine clamshell against G tband 10 x 5 sec Supine bridge with G tband resistance for abd 10 reps MH and estime to low back, L piriformis and gluts x 12 minutes, ICF  08/24/22 PROM and stretching to LE and LB in supine on MH Feet on ball bridge 10 x, KTC and obl 10 each Isometric abdominals 15 x hold 3 sec Red tband clams and hip flex 2 sets 10 Add ball  squeeze supine 15 x Sit fit 4 way pelvic ROM 15 x each Sit fit LAQ,hip flex and abd 10 each Nustep L 4 6 min  08/21/22 Nustep L 5 6 inch step up with opp leg ext and then laterally with opp leg abd ( BIL UE support) Wt ball ball ext 10x then rotation 10 x each STS with wt ball press 10 x Seated row and lats 20# 2 sets 10 Black tband trunk ext 2 sets 10 Green tband hip flex ,abd and clams 2 sets 10 Green tband LAQ 2 sets 10  08/17/2022 Nustep L5 x5 min 15 reps med ball lift and chop with yellow ball 4 laps around gym farmer carry with 8lbs 3x10 HS curl with heel dig on physioball Lumbar rolling 3 directions x2 minutes 2x10 standing hip abduction with 2lbs ankle weights 2x10 pallof press 10lbs 3x10 rows 10lbs  08/15/2022 Nustep L6 x5 min 2x10 Sit to stand with med ball press (yellow) 2x10 pallof press 10lbs 2x10 step up with knee drive 4 inch step  2 minutes lumbar flexion stretching with ball 3 directions Adduction ball squeeze with TrA activation x15  Seated lumbar SNAGs with stretching strap x10   08/09/22  Nustep L 5 6 min Black trunk flex and ext 2 sets 10 seated Supine feet on ball bridge, KTC and obl 2 sets 10  Isometric abdominals supine with ball 15 times hold 3 sec LE /trunk stretching supine. STW and stripping to Left IT, educated in having daughter use rolling pin at home for release and lye EOB in SL hanging leg off Red tband clams 2 sets 10 Hip flex red tband 2 sets 10 Add ball squeeze 15 x      08/07/22  Education Supine stretching- LTR, SKTC, active HS, figure 4, gluts   PATIENT EDUCATION:  Education details: POC, HEP Person educated: Patient Education method: Programmer, multimedia, Facilities manager, and Handouts Education comprehension: verbalized understanding and returned demonstration  HOME EXERCISE PROGRAM:  [id number]  ASSESSMENT:  CLINICAL IMPRESSION: Patient reports continued severe pain, moving down into lateral L lower leg. She missed Fri  appointment due to the pain. Her L piriformis and ITB are tight and full of TP Therapist provided deep tissue massage, stretch, and then facilitated strengthening for the muscles. Also provided MH and estim. Patient prefers heat to cold. She has a TNS unit but does not use it. Will assess response to estim to determine if she wants to try it again. Educated to continue deep tissue massage with tennis ball, and stretch at home.  OBJECTIVE IMPAIRMENTS: Abnormal gait, decreased activity tolerance, decreased balance, decreased coordination, decreased endurance, decreased mobility, difficulty walking, decreased ROM, decreased strength, increased muscle spasms, impaired flexibility, improper body mechanics, postural dysfunction, and pain.   ACTIVITY LIMITATIONS: carrying, lifting, bending, sitting, standing, squatting, sleeping, stairs, and locomotion level  PARTICIPATION LIMITATIONS: cleaning, shopping, and community activity  PERSONAL FACTORS: Age, Past/current experiences, and 1 comorbidity: multiple back surgeries  are also affecting patient's functional outcome.   REHAB POTENTIAL: Good  CLINICAL DECISION MAKING: Stable/uncomplicated  EVALUATION COMPLEXITY: Low   GOALS: Goals reviewed with patient? Yes  SHORT TERM GOALS: Target date: 08/24/22  I with initial HEP Baseline: Goal status: met 08/21/22  LONG TERM GOALS: Target date: 10/16/22  I with final HEP Baseline:  Goal status: INITIAL  2.  Patient will report decreased back pain by 50% Baseline:  Goal status: 08/24/22 ongoing  3.  Improve FGA score to at least 24 to demonstrate improved balance. Baseline: 16 Goal status: INITIAL  4.  Patient will walk at least 300' on level and unlevel surfaces with pain < 4/10, no unsteadiness, I. Baseline:  Goal status: on going 08/24/22  5.  Up and down 14 steps using U rail and step over step technique. Baseline:  Goal status: INITIAL PLAN:  PT FREQUENCY: 2x/week  PT DURATION: 10  weeks  PLANNED INTERVENTIONS: Therapeutic exercises, Therapeutic activity, Neuromuscular re-education, Balance training, Gait training, Patient/Family education, Self Care, Joint mobilization, Stair training, Dry Needling, Electrical stimulation, Cryotherapy, Moist heat, Ionotophoresis 4mg /ml Dexamethasone, and Manual therapy.  PLAN FOR NEXT SESSION: Update HEP with strengthening, spinal mobility exercises.    Encounter Date: 08/29/2022   10/28/2022 DPT 08/29/22 10:12 AM  [facility] [place] [address] [place], [place], [place] Phone: [phone]   Fax:  [phone] [place] [address] [place], [place], [place] Phone: [phone]   Fax:  [phone]  Patient Details  Name: Joy [name] MRN: [id number] Date of  Birth: Jan 05, 1941 Referring Provider:  Jarrett Soho, PA-C

## 2022-09-04 ENCOUNTER — Ambulatory Visit: Payer: Medicare Other | Admitting: Physical Therapy

## 2022-09-04 DIAGNOSIS — M6281 Muscle weakness (generalized): Secondary | ICD-10-CM | POA: Diagnosis not present

## 2022-09-04 DIAGNOSIS — G8929 Other chronic pain: Secondary | ICD-10-CM | POA: Diagnosis not present

## 2022-09-04 DIAGNOSIS — R2681 Unsteadiness on feet: Secondary | ICD-10-CM | POA: Diagnosis not present

## 2022-09-04 DIAGNOSIS — R262 Difficulty in walking, not elsewhere classified: Secondary | ICD-10-CM | POA: Diagnosis not present

## 2022-09-04 DIAGNOSIS — M5441 Lumbago with sciatica, right side: Secondary | ICD-10-CM | POA: Diagnosis not present

## 2022-09-04 DIAGNOSIS — M5442 Lumbago with sciatica, left side: Secondary | ICD-10-CM | POA: Diagnosis not present

## 2022-09-04 NOTE — Therapy (Signed)
OUTPATIENT PHYSICAL THERAPY THORACOLUMBAR   Patient Name: Joy [name] MRN: [id number] DOB:June 14, 1941, 82 y.o., female Today's Date: 09/04/2022  END OF SESSION:  PT End of Session - 09/04/22 0938     Visit Number 8    PT Start Time 0938    PT Stop Time 1016    PT Time Calculation (min) 38 min               Past Medical History:  Diagnosis Date   Ankylosing spondylitis (HCC)    Anxiety    Arthritis    RHEUMATOID   Back pain    Bronchitis    Constipation    COPD (chronic obstructive pulmonary disease) (HCC)    CXR 01/11/18 showed mild COPD and chronic bronchitis   CTS (carpal tunnel syndrome)    Dry eye    Dry mouth    Dysrhythmia    "irregularity" unknown at this time - being evaluated by cardiology   Essential hypertension 11/24/2015   GERD (gastroesophageal reflux disease)    HLA B27 (HLA B27 positive)    Hypercholesteremia    Hyperlipidemia 11/24/2015   Hypertension    IBS (irritable bowel syndrome)    Joint pain    Neuropathy    OAB (overactive bladder)    Obesity    Osteoarthritis    Osteoporosis    Pre-diabetes    Rheumatoid arthritis (HCC) 11/24/2015   Sciatica    Seasonal allergies    Spinal stenosis    Past Surgical History:  Procedure Laterality Date   ABDOMINAL HYSTERECTOMY  1995   BACK SURGERY     BREAST SURGERY     REDUCTION   CATARACT EXTRACTION W/PHACO  08/01/2012   Procedure: CATARACT EXTRACTION PHACO AND INTRAOCULAR LENS PLACEMENT (IOC);  Surgeon: Chalmers Guest, MD;  Location: [facility] OR;  Service: Ophthalmology;  Laterality: Right;   EYE SURGERY Bilateral    cataract   HERNIA REPAIR     RIGHT ING.   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   Medication monitoring encounter 09/15/2021   Pain of left hip joint 08/10/2020   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   Prediabetes 02/01/2020   Body mass index (BMI) 36.0-36.9, adult 02/01/2020   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   Inflammation of sacroiliac joint (HCC) 10/06/2018   Chronic bronchitis (HCC) 09/25/2018   Mucopurulent chronic bronchitis (HCC) 08/21/2018   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   Spondylosis 06/13/2018   Status post lumbar spinal fusion 06/13/2018   Status post lumbar spine surgery for decompression of spinal cord 06/13/2018   History of total knee replacement, 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   History of total knee arthroplasty, right 09/20/2017   OA (osteoarthritis) of knee 12/26/2015   Essential hypertension 11/24/2015   Hyperlipidemia 11/24/2015   Rheumatoid arthritis (HCC) 11/24/2015   Spinal stenosis of lumbar region 08/24/2014   Lumbar spondylosis 07/21/2014    PCP: Delena Serve,  Toni Amend, PA-C  REFERRING PROVIDER: Coletta Memos, MD  REFERRING DIAG: [phone] (ICD-10-CM) - Spinal stenosis, lumbar region with neurogenic claudication   Rationale for Evaluation and Treatment: Rehabilitation  THERAPY DIAG:  Chronic bilateral low back pain with bilateral sciatica  Difficulty in walking, not elsewhere classified  Muscle weakness (generalized)  ONSET DATE: 08/03/22  SUBJECTIVE:                                                                                                                                                                                           SUBJECTIVE STATEMENT: pt arrives feeling better. States  been rough since last session and had been in bed. PERTINENT HISTORY:  Thoraco-Lumbar fusion T11-L3 Persistent pain in back and L leg RA BTKR  PAIN:  Are you having pain? 3/10 distal lateral LLE  PRECAUTIONS: None  WEIGHT BEARING RESTRICTIONS: No  FALLS:  Has patient fallen in last 6 months? No  LIVING ENVIRONMENT: Lives with: lives with their family Lives in: House/apartment Stairs: Yes: Internal: 14 steps; on left going up and External: 1 steps; none Has following equipment at home: Single point cane, Walker - 2 wheeled, Environmental consultant - 4 wheeled, and Grab bars  OCCUPATION: N/A  PLOF: Independent  PATIENT GOALS: Relieve her pain  NEXT MD VISIT: About 6 weeks  OBJECTIVE:   DIAGNOSTIC FINDINGS:  Lumbar MRI 1. Prior thoracolumbar fusion as detailed above. Unchanged lucency about the T11, L3, and S1 screws associated with absent solid arthrodesis at T11-12, L2-3, and L5-S1. 2. Suspected chronic severe left and moderate right neural foraminal stenosis at L5-S1. 3. Mild-to-moderate neural foraminal stenosis at L2-3. 4. Widely patent lumbar spinal canal following decompression. 5. Mild thoracic disc degeneration and advanced upper thoracic facet arthrosis without significant stenosis. 6.  Aortic Atherosclerosis (ICD10-I70.0).  SCREENING FOR RED FLAGS: Bowel or bladder incontinence: No Spinal tumors: No Cauda equina syndrome: No Compression fracture: No Abdominal aneurysm: No  COGNITION: Overall cognitive status: Within functional limits for tasks assessed     SENSATION: Not tested  MUSCLE LENGTH: Hamstrings: Right 50 deg; Left 65 deg Thomas test: B hip flexors appear tight  POSTURE: rounded shoulders, forward head, decreased lumbar lordosis, decreased thoracic kyphosis, posterior pelvic tilt, and flexed trunk   PALPATION: TTP mid Thoracic paraspinals and lower lumbar paraspinals. Severely TTP along B gluts, piriformis-tight, B ITB TTP and tight  LUMBAR ROM:    AROM eval  Flexion WFL  Extension Severely limited  Right lateral flexion Mildly limited, painful  Left lateral flexion Mildly limited, painful  Right rotation WFL  Left rotation WFL   (Blank rows = not tested)  LOWER EXTREMITY ROM:  WFL, but stiff except where noted.  Passive  Right eval Left eval  Hip flexion Mod limited Mod limited  Hip extension Mod limited Mod limited  Hip abduction    Hip adduction    Hip internal rotation Mild Mod  Hip external rotation Mod Mod  Knee flexion    Knee extension    Ankle dorsiflexion    Ankle plantarflexion    Ankle inversion    Ankle eversion      LOWER EXTREMITY MMT:  B hip ext 4-/5, otherwise 4+/5  LUMBAR SPECIAL TESTS:  Straight leg raise test: Negative and Slump test: Negative  FUNCTIONAL TESTS:  5 times sit to stand: 11.93 Functional gait assessment: 16 /30  GAIT: Distance walked: clinic distances Assistive device utilized: Single point cane and used first thing in the morning Level of assistance: Modified independence Comments: Flexed posture, shuffling with increased lateral sway and increased effort.  TODAY'S TREATMENT:                                                                                                                              DATE:   09/04/22 Nustep L 5 78min Sit fit pelvic ROM 15 x 4 way, LAQ,marching and hip abd 10 x Red tband scap stab 10 x 4 way on sit fit Feet on ball core stab ex- multi ex with cuing Resisted clams, hip flex and ADD squeeze PROM LE and trunk    08/29/22 Deep STM to L piriformis and ITB followed by stretch Supine clamshell against G tband 10 x 5 sec Supine bridge with G tband resistance for abd 10 reps MH and estime to low back, L piriformis and gluts x 12 minutes, ICF  08/24/22 PROM and stretching to LE and LB in supine on MH Feet on ball bridge 10 x, KTC and obl 10 each Isometric abdominals 15 x hold 3 sec Red tband clams and hip flex 2 sets 10 Add ball squeeze  supine 15 x Sit fit 4 way pelvic ROM 15 x each Sit fit LAQ,hip flex and abd 10 each Nustep L 4 6 min  08/21/22 Nustep L 5 65min 6 inch step up with opp leg ext and then laterally with opp leg abd ( BIL UE support) Wt ball ball ext 10x then rotation 10 x each STS with wt ball press 10 x Seated row and lats 20# 2 sets 10 Black tband trunk ext 2 sets 10 Green tband hip flex ,abd and clams 2 sets 10 Green tband LAQ 2 sets 10  08/17/2022 Nustep L5 x5 min 15 reps med ball lift and chop with yellow ball 4 laps around gym farmer carry with 8lbs 3x10 HS curl with heel dig on physioball Lumbar rolling 3 directions x2 minutes 2x10 standing hip abduction with 2lbs ankle weights 2x10 pallof press 10lbs 3x10 rows 10lbs  08/15/2022 Nustep L6 x5 min 2x10 Sit to stand with med ball press (yellow) 2x10 pallof press 10lbs 2x10  step up with knee drive 4 inch step 2 minutes lumbar flexion stretching with ball 3 directions Adduction ball squeeze with TrA activation x15  Seated lumbar SNAGs with stretching strap x10   08/09/22  Nustep L 5 6 min Black trunk flex and ext 2 sets 10 seated Supine feet on ball bridge, KTC and obl 2 sets 10  Isometric abdominals supine with ball 15 times hold 3 sec LE /trunk stretching supine. STW and stripping to Left IT, educated in having daughter use rolling pin at home for release and lye EOB in SL hanging leg off Red tband clams 2 sets 10 Hip flex red tband 2 sets 10 Add ball squeeze 15 x      08/07/22  Education Supine stretching- LTR, SKTC, active HS, figure 4, gluts   PATIENT EDUCATION:  Education details: POC, HEP Person educated: Patient Education method: Programmer, multimedia, Facilities manager, and Handouts Education comprehension: verbalized understanding and returned demonstration  HOME EXERCISE PROGRAM:  [id number]  ASSESSMENT:  CLINICAL IMPRESSION: pt arrives feeling much better after stating weekend in bed. We discussed BM with lifting and  reaching and twisting as she did mention cleaning out closet. Progressed ex slowly with PROM   OBJECTIVE IMPAIRMENTS: Abnormal gait, decreased activity tolerance, decreased balance, decreased coordination, decreased endurance, decreased mobility, difficulty walking, decreased ROM, decreased strength, increased muscle spasms, impaired flexibility, improper body mechanics, postural dysfunction, and pain.   ACTIVITY LIMITATIONS: carrying, lifting, bending, sitting, standing, squatting, sleeping, stairs, and locomotion level  PARTICIPATION LIMITATIONS: cleaning, shopping, and community activity  PERSONAL FACTORS: Age, Past/current experiences, and 1 comorbidity: multiple back surgeries  are also affecting patient's functional outcome.   REHAB POTENTIAL: Good  CLINICAL DECISION MAKING: Stable/uncomplicated  EVALUATION COMPLEXITY: Low   GOALS: Goals reviewed with patient? Yes  SHORT TERM GOALS: Target date: 08/24/22  I with initial HEP Baseline: Goal status: met 08/21/22  LONG TERM GOALS: Target date: 10/16/22  I with final HEP Baseline:  Goal status: INITIAL  2.  Patient will report decreased back pain by 50% Baseline:  Goal status: 08/24/22 ongoing  3.  Improve FGA score to at least 24 to demonstrate improved balance. Baseline: 16 Goal status: INITIAL  4.  Patient will walk at least 300' on level and unlevel surfaces with pain < 4/10, no unsteadiness, I. Baseline:  Goal status: on going 08/24/22  5.  Up and down 14 steps using U rail and step over step technique. Baseline:  Goal status: INITIAL PLAN:  PT FREQUENCY: 2x/week  PT DURATION: 10 weeks  PLANNED INTERVENTIONS: Therapeutic exercises, Therapeutic activity, Neuromuscular re-education, Balance training, Gait training, Patient/Family education, Self Care, Joint mobilization, Stair training, Dry Needling, Electrical stimulation, Cryotherapy, Moist heat, Ionotophoresis 4mg /ml Dexamethasone, and Manual therapy.  PLAN FOR  NEXT SESSION: Update HEP with strengthening, spinal mobility exercises.    Encounter Date: 09/04/2022   09/06/2022 PTA01/16/24 9:39 AM  [facility] [place] [facility] [address] [place], [place], [place] Phone: [phone]   Fax:  [phone] [place] [address] [place], [place], [place] Phone: [phone]   Fax:  [phone]  Patient Details  Name: Joy [name] MRN: Karle Starch Date of Birth: Aug 26, 1940 Referring Provider:  09/13/1940, Jarrett Soho    [facility] [place] [facility] [address] [place], [place], [place] Phone: [phone]   Fax:  [phone]  Patient Details  Name: Joy [name] MRN: Karle Starch Date of Birth: 03-09-1941 Referring Provider:  09/13/1940,  PA-C  Encounter Date: 09/04/2022   Vickii Penna 09/04/2022, 9:39 AM  [place]. [place], [place], [address] Phone: [phone]   Fax:  [phone]

## 2022-09-05 DIAGNOSIS — M461 Sacroiliitis, not elsewhere classified: Secondary | ICD-10-CM | POA: Diagnosis not present

## 2022-09-05 DIAGNOSIS — M961 Postlaminectomy syndrome, not elsewhere classified: Secondary | ICD-10-CM | POA: Diagnosis not present

## 2022-09-05 DIAGNOSIS — M47816 Spondylosis without myelopathy or radiculopathy, lumbar region: Secondary | ICD-10-CM | POA: Diagnosis not present

## 2022-09-06 ENCOUNTER — Encounter: Payer: Self-pay | Admitting: Physical Therapy

## 2022-09-06 ENCOUNTER — Ambulatory Visit: Payer: Medicare Other | Admitting: Physical Therapy

## 2022-09-06 DIAGNOSIS — M6281 Muscle weakness (generalized): Secondary | ICD-10-CM

## 2022-09-06 DIAGNOSIS — R278 Other lack of coordination: Secondary | ICD-10-CM

## 2022-09-06 DIAGNOSIS — R2681 Unsteadiness on feet: Secondary | ICD-10-CM | POA: Diagnosis not present

## 2022-09-06 DIAGNOSIS — M5442 Lumbago with sciatica, left side: Secondary | ICD-10-CM | POA: Diagnosis not present

## 2022-09-06 DIAGNOSIS — R262 Difficulty in walking, not elsewhere classified: Secondary | ICD-10-CM

## 2022-09-06 DIAGNOSIS — M5441 Lumbago with sciatica, right side: Secondary | ICD-10-CM | POA: Diagnosis not present

## 2022-09-06 DIAGNOSIS — R279 Unspecified lack of coordination: Secondary | ICD-10-CM

## 2022-09-06 DIAGNOSIS — G8929 Other chronic pain: Secondary | ICD-10-CM

## 2022-09-06 DIAGNOSIS — R293 Abnormal posture: Secondary | ICD-10-CM

## 2022-09-06 NOTE — Therapy (Signed)
OUTPATIENT PHYSICAL THERAPY THORACOLUMBAR   Patient Name: Joy [name] MRN: [id number] DOB:01-30-1941, 82 y.o., female Today's Date: 09/06/2022  END OF SESSION:  PT End of Session - 09/06/22 0935     Visit Number 9    Date for PT Re-Evaluation 10/16/22    PT Start Time 0932    PT Stop Time 1010    PT Time Calculation (min) 38 min    Activity Tolerance Patient tolerated treatment well    Behavior During Therapy [facility] for tasks assessed/performed               Past Medical History:  Diagnosis Date   Ankylosing spondylitis ([place])    Anxiety    Arthritis    RHEUMATOID   Back pain    Bronchitis    Constipation    COPD (chronic obstructive pulmonary disease) ([place])    CXR 01/11/18 showed mild COPD and chronic bronchitis   CTS (carpal tunnel syndrome)    Dry eye    Dry mouth    Dysrhythmia    "irregularity" unknown at this time - being evaluated by cardiology   Essential hypertension 11/24/2015   GERD (gastroesophageal reflux disease)    HLA B27 (HLA B27 positive)    Hypercholesteremia    Hyperlipidemia 11/24/2015   Hypertension    IBS (irritable bowel syndrome)    Joint pain    Neuropathy    OAB (overactive bladder)    Obesity    Osteoarthritis    Osteoporosis    Pre-diabetes    Rheumatoid arthritis ([place]) 11/24/2015   Sciatica    Seasonal allergies    Spinal stenosis    Past Surgical History:  Procedure Laterality Date   ABDOMINAL HYSTERECTOMY  1995   BACK SURGERY     BREAST SURGERY     REDUCTION   CATARACT EXTRACTION W/PHACO  08/01/2012   Procedure: CATARACT EXTRACTION PHACO AND INTRAOCULAR LENS PLACEMENT ([place]);  Surgeon: Marylynn Pearson, MD;  Location: [place];  Service: Ophthalmology;  Laterality: Right;   EYE SURGERY Bilateral    cataract   HERNIA REPAIR     RIGHT ING.   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   Medication monitoring encounter 09/15/2021   Pain of left hip joint 08/10/2020   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   Prediabetes 02/01/2020   Body mass index (BMI) 36.0-36.9, adult 02/01/2020   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   Inflammation of sacroiliac joint ([place]) 10/06/2018   Chronic bronchitis ([place]) 09/25/2018   Mucopurulent chronic bronchitis ([place]) 08/21/2018   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   Spondylosis 06/13/2018   Status post lumbar spinal fusion 06/13/2018   Status post lumbar spine surgery for decompression of spinal cord 06/13/2018   History of total knee replacement, 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   History of total knee arthroplasty, right 09/20/2017   OA (osteoarthritis) of knee 12/26/2015   Essential hypertension 11/24/2015  Hyperlipidemia 11/24/2015   Rheumatoid arthritis (HCC) 11/24/2015   Spinal stenosis of lumbar region 08/24/2014   Lumbar spondylosis 07/21/2014    PCP: Jarrett Soho, PA-C  REFERRING PROVIDER: Coletta Memos, MD  REFERRING DIAG: [phone] (ICD-10-CM) - Spinal stenosis, lumbar region with neurogenic claudication   Rationale for Evaluation and Treatment: Rehabilitation  THERAPY DIAG:  Chronic bilateral low back pain with bilateral sciatica  Difficulty in walking, not elsewhere classified  Muscle weakness (generalized)  Unsteadiness on feet  Abnormal posture  Other lack of coordination  Unspecified lack of coordination  ONSET DATE:  08/03/22  SUBJECTIVE:                                                                                                                                                                                           SUBJECTIVE STATEMENT: Patient reports that she saw her pain Dr who feels he has identified a better location to receive and injection. She has not scheduled the injection yet. Hopes this will help because the stretching and exercises are only providing temprory relief PERTINENT HISTORy Thoraco-Lumbar fusion T11-L3 Persistent pain in back and L leg RA BTKR  PAIN:  Are you having pain? 3/10 distal lateral LLE  PRECAUTIONS: None  WEIGHT BEARING RESTRICTIONS: No  FALLS:  Has patient fallen in last 6 months? No  LIVING ENVIRONMENT: Lives with: lives with their family Lives in: House/apartment Stairs: Yes: Internal: 14 steps; on left going up and External: 1 steps; none Has following equipment at home: Single point cane, Walker - 2 wheeled, Environmental consultant - 4 wheeled, and Grab bars  OCCUPATION: N/A  PLOF: Independent  PATIENT GOALS: Relieve her pain  NEXT MD VISIT: About 6 weeks  OBJECTIVE:   DIAGNOSTIC FINDINGS:  Lumbar MRI 1. Prior thoracolumbar fusion as detailed above. Unchanged lucency about the T11, L3, and S1 screws associated with absent solid arthrodesis at T11-12, L2-3, and L5-S1. 2. Suspected chronic severe left and moderate right neural foraminal stenosis at L5-S1. 3. Mild-to-moderate neural foraminal stenosis at L2-3. 4. Widely patent lumbar spinal canal following decompression. 5. Mild thoracic disc degeneration and advanced upper thoracic facet arthrosis without significant stenosis. 6.  Aortic Atherosclerosis (ICD10-I70.0).  SCREENING FOR RED FLAGS: Bowel or bladder incontinence: No Spinal tumors: No Cauda equina syndrome: No Compression fracture: No Abdominal aneurysm: No  COGNITION: Overall cognitive status: Within functional limits for tasks  assessed     SENSATION: Not tested  MUSCLE LENGTH: Hamstrings: Right 50 deg; Left 65 deg Thomas test: B hip flexors appear tight  POSTURE: rounded shoulders, forward head, decreased lumbar lordosis, decreased thoracic kyphosis, posterior pelvic tilt, and flexed trunk  PALPATION: TTP mid Thoracic paraspinals and lower lumbar paraspinals. Severely TTP along B gluts, piriformis-tight, B ITB TTP and tight  LUMBAR ROM:   AROM eval  Flexion WFL  Extension Severely limited  Right lateral flexion Mildly limited, painful  Left lateral flexion Mildly limited, painful  Right rotation WFL  Left rotation WFL   (Blank rows = not tested)  LOWER EXTREMITY ROM:   WFL, but stiff except where noted.  Passive  Right eval Left eval  Hip flexion Mod limited Mod limited  Hip extension Mod limited Mod limited  Hip abduction    Hip adduction    Hip internal rotation Mild Mod  Hip external rotation Mod Mod  Knee flexion    Knee extension    Ankle dorsiflexion    Ankle plantarflexion    Ankle inversion    Ankle eversion      LOWER EXTREMITY MMT:  B hip ext 4-/5, otherwise 4+/5  LUMBAR SPECIAL TESTS:  Straight leg raise test: Negative and Slump test: Negative  FUNCTIONAL TESTS:  5 times sit to stand: 11.93 Functional gait assessment: 16 /30  GAIT: Distance walked: clinic distances Assistive device utilized: Single point cane and used first thing in the morning Level of assistance: Modified independence Comments: Flexed posture, shuffling with increased lateral sway and increased effort.  TODAY'S TREATMENT:                                                                                                                              DATE:  09/06/22 NuStep L 5 x 6 minutes Supine rolling BKTC, tried LTR, painful in low back Sidelying STM to L ITB, gluts and piriformis F/B Active Release of piriformis and ITB Supine iso add/abd 10 x 5 sec each direction Sit to stand on air ex with G  tband at knees, OHP with 2# ball x 10, repeat with chest press x 10 Heel raises on step x 15  09/04/22 Nustep L 5 85min Sit fit pelvic ROM 15 x 4 way, LAQ,marching and hip abd 10 x Red tband scap stab 10 x 4 way on sit fit Feet on ball core stab ex- multi ex with cuing Resisted clams, hip flex and ADD squeeze PROM LE and trunk  08/29/22 Deep STM to L piriformis and ITB followed by stretch Supine clamshell against G tband 10 x 5 sec Supine bridge with G tband resistance for abd 10 reps MH and estime to low back, L piriformis and gluts x 12 minutes, ICF  08/24/22 PROM and stretching to LE and LB in supine on MH Feet on ball bridge 10 x, KTC and obl 10 each Isometric abdominals 15 x hold 3 sec Red tband clams and hip flex 2 sets 10 Add ball squeeze supine 15 x Sit fit 4 way pelvic ROM 15 x each Sit fit LAQ,hip flex and abd 10 each Nustep L 4 6 min  08/21/22 Nustep L 5 27min 6 inch step up  with opp leg ext and then laterally with opp leg abd ( BIL UE support) Wt ball ball ext 10x then rotation 10 x each STS with wt ball press 10 x Seated row and lats 20# 2 sets 10 Black tband trunk ext 2 sets 10 Green tband hip flex ,abd and clams 2 sets 10 Green tband LAQ 2 sets 10  08/17/2022 Nustep L5 x5 min 15 reps med ball lift and chop with yellow ball 4 laps around gym farmer carry with 8lbs 3x10 HS curl with heel dig on physioball Lumbar rolling 3 directions x2 minutes 2x10 standing hip abduction with 2lbs ankle weights 2x10 pallof press 10lbs 3x10 rows 10lbs  PATIENT EDUCATION:  Education details: POC, HEP Person educated: Patient Education method: Programmer, multimedia, Facilities manager, and Handouts Education comprehension: verbalized understanding and returned demonstration  HOME EXERCISE PROGRAM:  [id number]  ASSESSMENT:  CLINICAL IMPRESSION: patient reports that her therapy sessions do provide temporary relief, but that the pain does return. She saw her pain Dr who identified a potential  location for an injection in her back for better pain relief. Needs to schedule the appointment. Therapist prvided deep STM to L hip muscles followed by stretching and strengthening.   OBJECTIVE IMPAIRMENTS: Abnormal gait, decreased activity tolerance, decreased balance, decreased coordination, decreased endurance, decreased mobility, difficulty walking, decreased ROM, decreased strength, increased muscle spasms, impaired flexibility, improper body mechanics, postural dysfunction, and pain.   ACTIVITY LIMITATIONS: carrying, lifting, bending, sitting, standing, squatting, sleeping, stairs, and locomotion level  PARTICIPATION LIMITATIONS: cleaning, shopping, and community activity  PERSONAL FACTORS: Age, Past/current experiences, and 1 comorbidity: multiple back surgeries  are also affecting patient's functional outcome.   REHAB POTENTIAL: Good  CLINICAL DECISION MAKING: Stable/uncomplicated  EVALUATION COMPLEXITY: Low   GOALS: Goals reviewed with patient? Yes  SHORT TERM GOALS: Target date: 08/24/22  I with initial HEP Baseline: Goal status: met 08/21/22  LONG TERM GOALS: Target date: 10/16/22  I with final HEP Baseline:  Goal status: INITIAL  2.  Patient will report decreased back pain by 50% Baseline:  Goal status: 08/24/22 ongoing  3.  Improve FGA score to at least 24 to demonstrate improved balance. Baseline: 16 Goal status: INITIAL  4.  Patient will walk at least 300' on level and unlevel surfaces with pain < 4/10, no unsteadiness, I. Baseline:  Goal status: on going 08/24/22  5.  Up and down 14 steps using U rail and step over step technique. Baseline:  Goal status: INITIAL PLAN:  PT FREQUENCY: 2x/week  PT DURATION: 10 weeks  PLANNED INTERVENTIONS: Therapeutic exercises, Therapeutic activity, Neuromuscular re-education, Balance training, Gait training, Patient/Family education, Self Care, Joint mobilization, Stair training, Dry Needling, Electrical stimulation,  Cryotherapy, Moist heat, Ionotophoresis 4mg /ml Dexamethasone, and Manual therapy.  PLAN FOR NEXT SESSION: Update HEP with strengthening, spinal mobility exercises.  DPT 09/06/22 10:10 AM  Patient Details  Name: STEFANA LODICO MRN: Karle Starch Date of Birth: 01/26/1941 Referring Provider:  09/13/1940, Jarrett Soho  [facility] [address] [place], [place], [place] Phone: [phone]   Fax:  [phone]

## 2022-09-12 DIAGNOSIS — L309 Dermatitis, unspecified: Secondary | ICD-10-CM | POA: Diagnosis not present

## 2022-09-12 DIAGNOSIS — M0589 Other rheumatoid arthritis with rheumatoid factor of multiple sites: Secondary | ICD-10-CM | POA: Diagnosis not present

## 2022-09-12 DIAGNOSIS — Z79899 Other long term (current) drug therapy: Secondary | ICD-10-CM | POA: Diagnosis not present

## 2022-09-19 ENCOUNTER — Encounter: Payer: Self-pay | Admitting: Gastroenterology

## 2022-09-19 ENCOUNTER — Telehealth: Payer: Self-pay

## 2022-09-19 ENCOUNTER — Ambulatory Visit (INDEPENDENT_AMBULATORY_CARE_PROVIDER_SITE_OTHER): Payer: Medicare Other | Admitting: Gastroenterology

## 2022-09-19 VITALS — BP 118/78 | HR 75 | Ht 62.0 in | Wt 191.0 lb

## 2022-09-19 DIAGNOSIS — K5900 Constipation, unspecified: Secondary | ICD-10-CM | POA: Diagnosis not present

## 2022-09-19 DIAGNOSIS — R109 Unspecified abdominal pain: Secondary | ICD-10-CM | POA: Diagnosis not present

## 2022-09-19 MED ORDER — NALOXEGOL OXALATE 12.5 MG PO TABS: 12.5000 mg | ORAL_TABLET | Freq: Every day | ORAL | 1 refills | Status: DC

## 2022-09-19 NOTE — Telephone Encounter (Signed)
-----  Message from Loralie Champagne, PA-C sent at 09/19/2022 12:26 PM EST ----- I just saw this patient in clinic.  When we tried to send her motegrity to her pharmacy but it said it was going to be over $500, same thing with trulance.  So I sent amitiza, which is twice a day with food.  Please let her know of the change.  Thank you,  Jess

## 2022-09-19 NOTE — Patient Instructions (Addendum)
We have sent the following medications to your pharmacy for you to pick up at your convenience:  Motegrity 2mg   Start Motergrity after bowel purge   Janett Billow  Zehr,PA-C  recommends that you complete a bowel purge (to clean out your bowels). Please do the following: Purchase a bottle of Miralax over the counter as well as a box of 5 mg dulcolax tablets. Take 4 dulcolax tablets. Wait 1 hour. You will then drink 6-8 capfuls of Miralax mixed in an adequate amount of water/juice/gatorade (you may choose which of these liquids to drink) over the next 2-3 hours. You should expect results within 1 to 6 hours after completing the bowel purge.   _______________________________________________________  If your blood pressure at your visit was 140/90 or greater, please contact your primary care physician to follow up on this.  _______________________________________________________  If you are age 45 or older, your body mass index should be between 23-30. Your Body mass index is 34.93 kg/m. If this is out of the aforementioned range listed, please consider follow up with your Primary Care Provider.  If you are age 63 or younger, your body mass index should be between 19-25. Your Body mass index is 34.93 kg/m. If this is out of the aformentioned range listed, please consider follow up with your Primary Care Provider.   ________________________________________________________  The [place] [facility] providers would like to encourage you to use [facility] to communicate with providers for non-urgent requests or questions.  Due to long hold times on the telephone, sending your provider a message by [facility] may be a faster and more efficient way to get a response.  Please allow 48 business hours for a response.  Please remember that this is for non-urgent requests.  _______________________________________________________   I appreciate the  opportunity to care for you  Thank You   Janett Billow Zehr,PA-C

## 2022-09-19 NOTE — Progress Notes (Signed)
09/19/2022 KARITA DRALLE [id number] 12/21/1940   HISTORY OF PRESENT ILLNESS: This is a pleasant 82 year old female who is a patient of Dr. Celesta Aver seen by him and underwent endoscopy for some upper GI complaints in 2022.  She is here today with complaints of bilateral lower abdominal pain.  She says that it is just a constant discomfort.  She also reports constipation and issues with moving her bowels.  She says that she is not good about taking the MiraLAX regularly.  This was discussed somewhat with Dr. Carlean Purl back in May 2022 when she saw him as well.  She had a CT scan of the abdomen and pelvis without contrast in January 2023 that did not show any concerning causes for her complaints of lower abdominal pain at that time.  From Dr. Celesta Aver records review it looks like her last colonoscopy was in 2006 at which time she only had diverticulosis noted.  Past Medical History:  Diagnosis Date   Ankylosing spondylitis ([place])    Anxiety    Arthritis    RHEUMATOID   Back pain    Bronchitis    Constipation    COPD (chronic obstructive pulmonary disease) ([place])    CXR 01/11/18 showed mild COPD and chronic bronchitis   CTS (carpal tunnel syndrome)    Dry eye    Dry mouth    Dysrhythmia    "irregularity" unknown at this time - being evaluated by cardiology   Essential hypertension 11/24/2015   GERD (gastroesophageal reflux disease)    HLA B27 (HLA B27 positive)    Hypercholesteremia    Hyperlipidemia 11/24/2015   Hypertension    IBS (irritable bowel syndrome)    Joint pain    Neuropathy    OAB (overactive bladder)    Obesity    Osteoarthritis    Osteoporosis    Pre-diabetes    Rheumatoid arthritis ([place]) 11/24/2015   Sciatica    Seasonal allergies    Spinal stenosis    Past Surgical History:  Procedure Laterality Date   ABDOMINAL HYSTERECTOMY  1995   BACK SURGERY     BREAST SURGERY     REDUCTION   CATARACT EXTRACTION W/PHACO  08/01/2012   Procedure: CATARACT EXTRACTION  PHACO AND INTRAOCULAR LENS PLACEMENT ([place]);  Surgeon: Marylynn Pearson, MD;  Location: [place];  Service: Ophthalmology;  Laterality: Right;   EYE SURGERY Bilateral    cataract   HERNIA REPAIR     RIGHT ING.   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   Procedure: TOTAL KNEE ARTHROPLASTY;  Surgeon: Gaynelle Arabian, MD;  [facility];  Service: Orthopedics;  Laterality: Left;    reports that she quit smoking about 27 years ago. Her smoking use included cigarettes. She has a 35.00 pack-year smoking history. She has never used smokeless tobacco. She reports current alcohol use. She reports that she does not use drugs. family history includes Arthritis in her sister, sister, and sister; Cerebral aneurysm in her mother; Diabetes in her sister and sister; Heart attack in her daughter and father; Heart disease in her father and mother; Hypertension in her father, mother, sister, sister, sister, sister, and sister; Obesity in her mother; Rheum arthritis in her maternal grandmother; Stroke in her sister. Allergies  Allergen Reactions   Codeine Other (See Comments)    Lump in throat   Tape Hives and Rash    Paper tape only  Latex Rash   Nickel Rash    Bumps Also other metals      Outpatient Encounter Medications as of 09/19/2022  Medication Sig   albuterol (VENTOLIN HFA) 108 (90 Base) MCG/ACT inhaler Inhale 2 puffs into the lungs every 4 (four) hours as needed for wheezing or shortness of breath.   amLODipine (NORVASC) 2.5 MG tablet TAKE 1 TABLET(2.5 MG) BY MOUTH DAILY   BREO ELLIPTA 200-25 MCG/ACT AEPB INHALE 1 PUFF INTO THE LUNGS DAILY   Cholecalciferol (VITAMIN D) 50 MCG (2000 UT) tablet Take 2,000 Units by mouth daily.   diphenhydrAMINE (BENADRYL) 25 MG tablet Take 25 mg by mouth daily as needed for allergies.   famotidine (PEPCID) 20 MG tablet Take 20 mg by mouth daily.   gabapentin (NEURONTIN) 300 MG capsule Take 300-600 mg by mouth  See admin instructions. Take 300 mg in the morning, 300 mg in the afternoon, and 600 mg at night   HYDROcodone bit-homatropine (HYCODAN) 5-1.5 MG/5ML syrup Take 5 mLs by mouth every 6 (six) hours as needed for cough.   inFLIXimab (REMICADE IV) Inject 100 mg into the vein every 6 (six) weeks.   irbesartan (AVAPRO) 300 MG tablet Take 1 tablet (300 mg total) by mouth daily.   Magnesium 250 MG TABS Take 1 tablet (250 mg total) by mouth daily.   naproxen sodium (ALEVE) 220 MG tablet Take 440 mg by mouth daily as needed (pain).   pantoprazole (PROTONIX) 40 MG tablet Take 40 mg by mouth daily.   polyethylene glycol (MIRALAX / GLYCOLAX) 17 g packet Take 17 g by mouth daily as needed for moderate constipation.   Propylene Glycol (SYSTANE BALANCE) 0.6 % SOLN Place 1 drop into both eyes 2 (two) times daily as needed (dry eyes).   rosuvastatin (CRESTOR) 20 MG tablet Take 1 tablet (20 mg total) by mouth daily. NEED APPOINTMENT   sertraline (ZOLOFT) 25 MG tablet Take 50 mg by mouth daily.    tobramycin, PF, (TOBI) 300 MG/5ML nebulizer solution Take 5 mLs (300 mg total) by nebulization 2 (two) times daily.   umeclidinium-vilanterol (ANORO ELLIPTA) 62.5-25 MCG/ACT AEPB Inhale 1 puff into the lungs daily.   Varenicline Tartrate (TYRVAYA) 0.03 MG/ACT SOLN Place 1 spray into the nose daily.   vitamin B-12 (CYANOCOBALAMIN) 100 MCG tablet Take 100 mcg by mouth daily.   Menthol, Topical Analgesic, (ICY HOT EX) Apply 1 Application topically daily as needed (pain). (Patient not taking: Reported on 09/19/2022)   No facility-administered encounter medications on file as of 09/19/2022.     REVIEW OF SYSTEMS  : All other systems reviewed and negative except where noted in the History of Present Illness.   PHYSICAL EXAM: BP 118/78   Pulse 75   Ht 5\' 2"  (1.575 m)   Wt 191 lb (86.6 kg)   SpO2 97%   BMI 34.93 kg/m  General: Well developed female in no acute distress Head: Normocephalic and atraumatic Eyes:  Sclerae  anicteric, conjunctiva pink. Ears: Normal auditory acuity Lungs: Clear throughout to auscultation; no W/R/R. Heart: Regular rate and rhythm; no M/R/G. Abdomen: Soft, non-distended.  BS present.  Mild lower abdominal TTP. Musculoskeletal: Symmetrical with no gross deformities  Skin: No lesions on visible extremities Extremities: No edema  Neurological: Alert oriented x 4, grossly non-focal Psychological:  Alert and cooperative. Normal mood and affect  ASSESSMENT AND PLAN: *82 year old female with complaints of lower abdominal pain and constipation.  She had a CT scan exactly a year ago for complaints of lower abdominal  pain.  This was without contrast, but did not show anything concerning in regards to her symptoms.  Possibly some of her discomfort could be related to constipation.  She says she is not good about taking her MiraLAX.  Will try something else prescription instead.  Linzess is nonformulary.  Trulance and Motegrity were both going to be over $500 a month.  Will try for Movantik starting at 12.5 mg daily.  Prescription sent to pharmacy.  She will do a Miralax bowel purge then start the prescription daily.  I will see her back in follow-up and 4 to 6 weeks.  May need to consider colonoscopy if no improvement.  CC:  Marda Stalker, PA-C

## 2022-09-19 NOTE — Telephone Encounter (Signed)
The pt has been advised of the change in the prescription.

## 2022-09-20 DIAGNOSIS — R350 Frequency of micturition: Secondary | ICD-10-CM | POA: Diagnosis not present

## 2022-09-20 DIAGNOSIS — Z6836 Body mass index (BMI) 36.0-36.9, adult: Secondary | ICD-10-CM | POA: Diagnosis not present

## 2022-09-20 DIAGNOSIS — R102 Pelvic and perineal pain: Secondary | ICD-10-CM | POA: Diagnosis not present

## 2022-09-21 ENCOUNTER — Other Ambulatory Visit: Payer: Self-pay | Admitting: Family Medicine

## 2022-09-21 DIAGNOSIS — R102 Pelvic and perineal pain: Secondary | ICD-10-CM

## 2022-09-25 DIAGNOSIS — M461 Sacroiliitis, not elsewhere classified: Secondary | ICD-10-CM | POA: Diagnosis not present

## 2022-10-02 ENCOUNTER — Telehealth: Payer: Self-pay

## 2022-10-02 NOTE — Patient Outreach (Signed)
  Care Coordination   10/02/2022 Name: [name] MRN: [id number] DOB: [birth year]   Care Coordination Outreach Attempts:  An unsuccessful telephone outreach was attempted today to offer the patient information about available care coordination services as a benefit of their health plan.   Follow Up Plan:  Additional outreach attempts will be made to offer the patient care coordination information and services.   Encounter Outcome:  No Answer   Care Coordination Interventions:  No, not indicated    SIG Peter Garter RN, BSN,CCM, CDE Care Management Coordinator [facility] [phone]

## 2022-10-10 ENCOUNTER — Other Ambulatory Visit (HOSPITAL_BASED_OUTPATIENT_CLINIC_OR_DEPARTMENT_OTHER): Payer: Self-pay | Admitting: Cardiovascular Disease

## 2022-10-10 NOTE — Telephone Encounter (Signed)
Please call pt to schedule overdue follow-up appointment with Dr. Oval Linsey or APP for refills. Pt last seen by Dr. Oval Linsey in 05/2021. Thank you!

## 2022-10-11 ENCOUNTER — Ambulatory Visit
Admission: RE | Admit: 2022-10-11 | Discharge: 2022-10-11 | Disposition: A | Discharge status: Home / Self Care | Payer: Medicare Other | Source: Ambulatory Visit | Attending: Family Medicine | Admitting: Family Medicine

## 2022-10-11 ENCOUNTER — Telehealth: Payer: Self-pay

## 2022-10-11 DIAGNOSIS — R102 Pelvic and perineal pain: Secondary | ICD-10-CM

## 2022-10-11 NOTE — Patient Outreach (Signed)
  Care Coordination   Initial Visit Note   10/11/2022 Name: AHRIA MENO MRN: [id number] DOB: 13-Oct-1940  LETTA NEIDICH is a 82 y.o. year old female who sees Marda Stalker, [place] for primary care. I spoke with  Isidor Holts by phone today.  What matters to the patients health and wellness today?  No concerns today States she sees her specialist regularly     Goals Addressed             This Visit's Progress    COMPLETED: Care Coordination Activities - no follow up required       Interventions Today    Flowsheet Row Most Recent Value  Chronic Disease   Chronic disease during today's visit Other  [RA]  General Interventions   General Interventions Discussed/Reviewed General Interventions Discussed, Doctor Visits  Doctor Visits Discussed/Reviewed Doctor Visits Discussed, Annual Wellness Visits  Education Interventions   Education Provided Provided Education  Provided Verbal Education On When to see the doctor, Other  [care coordination services]              SDOH assessments and interventions completed:  Yes  SDOH Interventions Today    Flowsheet Row Most Recent Value  SDOH Interventions   Food Insecurity Interventions Intervention Not Indicated  Housing Interventions Intervention Not Indicated  Transportation Interventions Intervention Not Indicated  Utilities Interventions Intervention Not Indicated        Care Coordination Interventions:  Yes, provided   Follow up plan: No further intervention required.   Encounter Outcome:  Pt. Visit Completed  Peter Garter RN, BSN,CCM, CDE Care Management Coordinator [facility] [phone]

## 2022-10-11 NOTE — Patient Instructions (Signed)
Visit Information  Thank you for taking time to visit with me today. Please don't hesitate to contact me if I can be of assistance to you.   Following are the goals we discussed today:   Goals Addressed             This Visit's Progress    COMPLETED: Care Coordination Activities - no follow up required       Interventions Today    Flowsheet Row Most Recent Value  Chronic Disease   Chronic disease during today's visit Other  [RA]  General Interventions   General Interventions Discussed/Reviewed General Interventions Discussed, Doctor Visits  Doctor Visits Discussed/Reviewed Doctor Visits Discussed, Annual Wellness Visits  Education Interventions   Education Provided Provided Education  Provided Verbal Education On When to see the doctor, Other  [care coordination services]              If you are experiencing a Mental Health or Cross or need someone to talk to, please call the Suicide and Crisis Lifeline: 988 call the [place] National Suicide Prevention Lifeline: 660-533-0319 or TTY: (787)825-0582 TTY 2535133224) to talk to a trained counselor call 1-800-273-TALK (toll free, 24 hour hotline) go to [facility] [place] [phone]) call 911   Patient verbalizes understanding of instructions and care plan provided today and agrees to view in [place]. Active MyChart status and patient understanding of how to access instructions and care plan via MyChart confirmed with patient.     No further follow up required:    Peter Garter RN, Jackquline [place], [facility] [phone]

## 2022-10-12 ENCOUNTER — Encounter (HOSPITAL_BASED_OUTPATIENT_CLINIC_OR_DEPARTMENT_OTHER): Payer: Self-pay | Admitting: Pulmonary Disease

## 2022-10-12 ENCOUNTER — Other Ambulatory Visit: Payer: Self-pay

## 2022-10-12 ENCOUNTER — Ambulatory Visit (INDEPENDENT_AMBULATORY_CARE_PROVIDER_SITE_OTHER): Payer: Medicare Other | Admitting: Pulmonary Disease

## 2022-10-12 VITALS — BP 144/88 | HR 80 | Ht 62.0 in | Wt 188.5 lb

## 2022-10-12 DIAGNOSIS — R911 Solitary pulmonary nodule: Secondary | ICD-10-CM | POA: Diagnosis not present

## 2022-10-12 MED ORDER — ROSUVASTATIN CALCIUM 20 MG PO TABS: 20.0000 mg | ORAL_TABLET | Freq: Every day | ORAL | 0 refills | Status: DC

## 2022-10-12 MED ORDER — HYDROCODONE BIT-HOMATROP MBR 5-1.5 MG/5ML PO SOLN: 5.0000 mL | Freq: Four times a day (QID) | ORAL | 0 refills | Status: DC | PRN

## 2022-10-12 MED ORDER — FLUTICASONE-SALMETEROL 250-50 MCG/ACT IN AEPB: 1.0000 | INHALATION_SPRAY | Freq: Two times a day (BID) | RESPIRATORY_TRACT | 5 refills | Status: DC

## 2022-10-12 NOTE — Patient Instructions (Addendum)
Chronic bronchitis --START Wixela ONE puff in the morning and evening. Pick up at [facility] with goodrx --START Spiriva TWO puffs ONCE a day. Until you run out --CONTINUE Albuterol every 4 hours as needed for shortness of breath or wheezing. REFILL --Continue Protonix 40 mg daily in setting of known hiatal hernia --CONTINUE codeine cough syrup as needed. REFILL  RLL lung nodularity- improving from 14 mm>5m Resolved RLL with cyst. Stable nodules including RUL and LUL Interval development of LUL nodules with largest 665m --Awaiting CT Chest in April 2024 for follow-up  Follow-up with me in 4 months

## 2022-10-12 NOTE — Progress Notes (Unsigned)
Synopsis: Referred in 05/2018 for hx of chronic bronchitis x 2 years.   Subjective:   PATIENT ID: Joy [name] GENDER: female DOB: September 06, 1940, MRN: [id number]   HPI  Chief Complaint  Patient presents with   Follow-up    Coughing on and off about 2wks   Ms. 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 Started on Spiriva 2020 - Improved symptoms on Spiriva however discontinued due to cost.  2022 - Restarted on Spiriva. August - Augmentin. September - steroids. November +pseudomonas treated x 3 weeks with persistent symptoms  10/02/21 She was seen by ID on 09/15/21 with Dr. Linus Salmons. CXR repeated with no active disease however given Pseudomonas colonization, inhaled tobramycin ordered. Has not picked it up yet and will need a nebulizer. She has remained compliant with Spiriva. She continues to have productive cough with brown sputum that is unchanged. Taking mucinex once a day. Denies shortness of breath or wheezing. Rarely uses rescue inhaler.  12/15/21 She presents for acute follow-up. Known pseudomonas colonization. She was seen by ID last month and unfortunately unable to start inhaled tobra due to financial limitations. Cultures remained sensitive to cipro so ID prescribed PO antibiotics on 10/27/21. She continues to have productive cough. Not as thick as before but coughing spells up to 40 min. Worse at night and causes her to feel fatigued. Denies shortness of breath or wheezing.  01/31/22 Since our last visit she was advised to start inhaled tobramycin. Started using mouthpiece and switched to facemask. She does not feel like it is effective. Compliant with her Anoro. Her cough remains deep and hard to expel sputum in the last few days. Increased wheezing and shortness of breath. Denies fevers, chills. She is having chronic  back pain and may need surgery later this year.   03/16/22 Since our last visit she completed antibiotic. She completed tobramycin.  Initially felt her symptoms improved until this month.  She has had recurrent episodes of bronchitis including twice July 2023. She currently has dark sputum with cough. Scant hemoptysis.  Denies wheezing.  Denies fevers or chills.  10/12/22 Since our last visit she has increased sputum production and increased rhinorrhea. Symptoms are manageable. Has run out of Breo for a few onths due to cost. Has some leftover Spiriva.  Social History: Quit smoking in 1995. Smoked for 30 years x 1ppd.   Past Medical History:  Diagnosis Date   Ankylosing spondylitis (HCC)    Anxiety    Arthritis    RHEUMATOID   Back pain    Bronchitis    Constipation    COPD (chronic obstructive pulmonary disease) ([place])    CXR 01/11/18 showed mild COPD and chronic bronchitis   CTS (carpal tunnel syndrome)    Dry eye    Dry mouth    Dysrhythmia    "irregularity" unknown at this time - being evaluated by cardiology   Essential hypertension 11/24/2015   GERD (gastroesophageal reflux disease)    HLA B27 (HLA B27 positive)    Hypercholesteremia    Hyperlipidemia 11/24/2015   Hypertension    IBS (irritable bowel syndrome)    Joint pain    Neuropathy    OAB (overactive bladder)    Obesity    Osteoarthritis    Osteoporosis    Pre-diabetes    Rheumatoid arthritis ([place]) 11/24/2015   Sciatica  Seasonal allergies    Spinal stenosis     Allergies  Allergen Reactions   Codeine Other (See Comments)    Lump in throat   Tape Hives and Rash    Paper tape only   Latex Rash   Nickel Rash    Bumps Also other metals     Outpatient Medications Prior to Visit  Medication Sig Dispense Refill   albuterol (VENTOLIN HFA) 108 (90 Base) MCG/ACT inhaler Inhale 2 puffs into the lungs every 4 (four) hours as needed for wheezing or shortness of breath. 8 g 6   amLODipine (NORVASC) 2.5 MG tablet  TAKE 1 TABLET(2.5 MG) BY MOUTH DAILY 90 tablet 1   Cholecalciferol (VITAMIN D) 50 MCG (2000 UT) tablet Take 2,000 Units by mouth daily.     diphenhydrAMINE (BENADRYL) 25 MG tablet Take 25 mg by mouth daily as needed for allergies.     famotidine (PEPCID) 20 MG tablet Take 20 mg by mouth daily.     gabapentin (NEURONTIN) 300 MG capsule Take 300-600 mg by mouth See admin instructions. Take 300 mg in the morning, 300 mg in the afternoon, and 600 mg at night  1   HYDROcodone bit-homatropine (HYCODAN) 5-1.5 MG/5ML syrup Take 5 mLs by mouth every 6 (six) hours as needed for cough. 240 mL 0   irbesartan (AVAPRO) 300 MG tablet Take 1 tablet (300 mg total) by mouth daily. 30 tablet 0   Magnesium 250 MG TABS Take 1 tablet (250 mg total) by mouth daily. 30 tablet 0   naloxegol oxalate (MOVANTIK) 12.5 MG TABS tablet Take 1 tablet (12.5 mg total) by mouth daily. 30 tablet 1   naproxen sodium (ALEVE) 220 MG tablet Take 440 mg by mouth daily as needed (pain).     pantoprazole (PROTONIX) 40 MG tablet Take 40 mg by mouth daily.     polyethylene glycol (MIRALAX / GLYCOLAX) 17 g packet Take 17 g by mouth daily as needed for moderate constipation.     Propylene Glycol (SYSTANE BALANCE) 0.6 % SOLN Place 1 drop into both eyes 2 (two) times daily as needed (dry eyes).     rosuvastatin (CRESTOR) 20 MG tablet Take 1 tablet (20 mg total) by mouth daily. NEED APPOINTMENT 15 tablet 0   sertraline (ZOLOFT) 25 MG tablet Take 50 mg by mouth daily.      tobramycin, PF, (TOBI) 300 MG/5ML nebulizer solution Take 5 mLs (300 mg total) by nebulization 2 (two) times daily. 280 mL 0   umeclidinium-vilanterol (ANORO ELLIPTA) 62.5-25 MCG/ACT AEPB Inhale 1 puff into the lungs daily.     Varenicline Tartrate (TYRVAYA) 0.03 MG/ACT SOLN Place 1 spray into the nose daily.     vitamin B-12 (CYANOCOBALAMIN) 100 MCG tablet Take 100 mcg by mouth daily.     BREO ELLIPTA 200-25 MCG/ACT AEPB INHALE 1 PUFF INTO THE LUNGS DAILY (Patient not taking:  Reported on 10/12/2022) 60 each 3   inFLIXimab (REMICADE IV) Inject 100 mg into the vein every 6 (six) weeks.     Menthol, Topical Analgesic, (ICY HOT EX) Apply 1 Application topically daily as needed (pain). (Patient not taking: Reported on 09/19/2022)     No facility-administered medications prior to visit.    ROS  Objective:   Vitals:   10/12/22 1440  BP: (!) 144/88  Pulse: 80  SpO2: 100%  Weight: 188 lb 7.9 oz (85.5 kg)  Height: '5\' 2"'$  (1.575 m)   Physical Exam: General: Well-appearing, no acute distress HENT: [place], AT Eyes:  EOMI, no scleral icterus Respiratory: ***Clear to auscultation bilaterally.  No crackles, wheezing or rales Cardiovascular: RRR, -M/R/G, no JVD Extremities:-Edema,-tenderness Neuro: AAO x4, CNII-XII grossly intact Psych: Normal mood, normal affect   Chest imaging: CXR 01/11/18 - No pulmonary edema, effusion or infiltrate CT HR 07/28/21 - RLL nodule 1.4 x 1 and RUL ground glas nodule. Otherwise normal parenchyma. Moderate/large hiatal hernia. 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 Unchanged coarse interstitial markings suggestive of chronic bronchitis CT Chest 12/21/21 - Right lower lobe nodule decreased from 2m to 9 mm. Stable GGO. CT Chest 06/07/22 - Resolved RLL with cyst. Stable nodules including RUL and LUL Interval development of LUL nodules with largest 6379m  PFT:  08/21/18  FVC 2.4 (154%) FEV1 2.21 (156%) Ratio 74 TLC 104% DLCO corrected 78% Interpretation: Normal spirometry and lung volumes with mildly reduced DLCO  Mild obstructive defect present with mildly reduced DLCO. TLC normal. No significant bronchodilator effect present however does not preclude benefit of bronchodilator therapy.  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  Status post tobramycin inhalation x1 month.  Has had recurrent episodes of bronchitis after completion and has noticed more purulent thick sputum in the last few days.  Unsure for ICS/LABA is effective but it has only been 1 month.  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  Pseudomonas colonization - s/p tobramycin Immunosuppressed patient --Completed tobramycin --If persistent PA, may need to refer to ID again for extended tobra  Chronic bronchitis --START Wixela ONE puff in the morning and evening. Pick up at [facility] with goodrx --START Spiriva TWO puffs ONCE a day. Until you run out --CONTINUE Albuterol every 4 hours as needed for shortness of breath or wheezing. REFILL --Continue Protonix 40 mg daily in setting of known hiatal hernia --CONTINUE codeine cough syrup as needed. REFILL  RLL lung nodularity- improving from 14 mm>79m25mesolved RLL with cyst. Stable nodules including RUL and LUL Interval development of LUL nodules with largest 6mm40m-Awaiting CT Chest in April 2024 for follow-up  Immunization History  Administered Date(s) Administered   Fluad Quad(high Dose 65+) 04/21/2019, 05/20/2021   Influenza Split 05/22/2012, 04/16/2014   Influenza, High Dose Seasonal PF 05/20/2018, 05/24/2020   Influenza-Unspecified 04/26/2015, 06/16/2019   PFIZER(Purple Top)SARS-COV-2 Vaccination 09/11/2019, 09/25/2019, 05/14/2020, 11/25/2020   Pfizer Covid-19 Vaccine Bivalent Booster 65yr28yrp 05/08/2021   Pneumococcal Conjugate-13 04/16/2014   Pneumococcal Polysaccharide-23 04/26/2015   Tdap 10/19/2014   No orders of the defined types were placed in this encounter.  No orders of  the defined types were placed in this encounter.  No follow-ups on file.  I have spent a total time of 32-minutes on the day of the appointment reviewing prior documentation, coordinating care and discussing medical diagnosis and plan with the patient/family. Past medical history, allergies, medications were reviewed. Pertinent imaging, labs and tests included in this note have been reviewed and interpreted independently by me.  Emelin Dascenzo [facility] 10/12/2022

## 2022-10-15 ENCOUNTER — Other Ambulatory Visit (HOSPITAL_COMMUNITY): Payer: Self-pay

## 2022-10-15 ENCOUNTER — Telehealth: Payer: Self-pay

## 2022-10-15 NOTE — Telephone Encounter (Signed)
PA request received via CMM for Fluticasone-Salmeterol 250-50MCG/ACT aerosol powder  PA has been submitted to [facility].   Key: [id number]

## 2022-10-15 NOTE — Telephone Encounter (Signed)
Left message for patient to call and schedule overdue follow  up with Dr. Oval Linsey / APP for medication refills

## 2022-10-15 NOTE — Telephone Encounter (Signed)
PA has been APPROVED from 10/15/2022-08/20/2023

## 2022-10-16 ENCOUNTER — Encounter (HOSPITAL_BASED_OUTPATIENT_CLINIC_OR_DEPARTMENT_OTHER): Payer: Self-pay | Admitting: Pulmonary Disease

## 2022-10-17 NOTE — Telephone Encounter (Signed)
Patient is scheduled 10/29/22 with Laurann Montana, NP

## 2022-10-21 ENCOUNTER — Encounter (HOSPITAL_BASED_OUTPATIENT_CLINIC_OR_DEPARTMENT_OTHER): Payer: Self-pay | Admitting: Emergency Medicine

## 2022-10-21 ENCOUNTER — Emergency Department (HOSPITAL_BASED_OUTPATIENT_CLINIC_OR_DEPARTMENT_OTHER)
Admission: EM | Admit: 2022-10-21 | Discharge: 2022-10-21 | Disposition: A | Discharge status: Home / Self Care | Payer: Medicare Other | Attending: Emergency Medicine | Admitting: Emergency Medicine

## 2022-10-21 ENCOUNTER — Other Ambulatory Visit: Payer: Self-pay

## 2022-10-21 DIAGNOSIS — G8929 Other chronic pain: Secondary | ICD-10-CM | POA: Insufficient documentation

## 2022-10-21 DIAGNOSIS — M5442 Lumbago with sciatica, left side: Secondary | ICD-10-CM | POA: Diagnosis not present

## 2022-10-21 DIAGNOSIS — Z7951 Long term (current) use of inhaled steroids: Secondary | ICD-10-CM | POA: Insufficient documentation

## 2022-10-21 DIAGNOSIS — Z91040 Latex allergy status: Secondary | ICD-10-CM | POA: Diagnosis not present

## 2022-10-21 DIAGNOSIS — J449 Chronic obstructive pulmonary disease, unspecified: Secondary | ICD-10-CM | POA: Diagnosis not present

## 2022-10-21 DIAGNOSIS — M25512 Pain in left shoulder: Secondary | ICD-10-CM | POA: Diagnosis not present

## 2022-10-21 MED ORDER — PREDNISONE 50 MG PO TABS
60.0000 mg | ORAL_TABLET | Freq: Once | ORAL | Status: AC
  Administered 2022-10-21: 60 mg via ORAL
  Filled 2022-10-21: qty 1

## 2022-10-21 MED ORDER — HYDROMORPHONE HCL 1 MG/ML IJ SOLN
1.0000 mg | Freq: Once | INTRAMUSCULAR | Status: AC
  Administered 2022-10-21: 1 mg via INTRAMUSCULAR
  Filled 2022-10-21: qty 1

## 2022-10-21 MED ORDER — PREDNISONE 20 MG PO TABS: 40.0000 mg | ORAL_TABLET | Freq: Every day | ORAL | 0 refills | Status: DC

## 2022-10-21 NOTE — ED Provider Notes (Signed)
[place] [facility] Provider Note   CSN: [id number] Arrival date & time: 10/21/22  0703     History  Chief Complaint  Patient presents with   Shoulder Pain    Joy [name] is a 82 y.o. female.  HPI Patient reports she has pain all over.  However, with further clarification with the patient and her son and daughter at bedside, patient has longstanding and significant chronic lower back pain and lower extremity pain from spinal stenosis.  Review of EMR indicates she is undergoing physical therapy and she reports she has had lumbar injections.  She reports they do not work very well and she is persistently in pain.  Patient also has rheumatoid arthritis and is due for a Remicade infusion.  She and family reports that they are presenting today due to inability to get adequate pain control at home.  But the left shoulder pain is more acutely painful and patient cannot find comfortable position.  She has tried her Vicodin tablet yesterday evening for pain but did not get much relief.  Left shoulder pain is not a usual pain for her.  Is much worse with particular movements.  She reports that she tries to elevate her arm above her head it hurts a lot.  She reports that the shoulder so painful that it is hard for her to move it.  No fevers no chills.  No nausea no vomiting.  Patient is eating and drinking.  No chest pain or shortness of breath.    Home Medications Prior to Admission medications   Medication Sig Start Date End Date Taking? Authorizing Provider  predniSONE (DELTASONE) 20 MG tablet Take 2 tablets (40 mg total) by mouth daily. 10/21/22  Yes Charlesetta Shanks, MD  albuterol (VENTOLIN HFA) 108 (90 Base) MCG/ACT inhaler Inhale 2 puffs into the lungs every 4 (four) hours as needed for wheezing or shortness of breath. 01/31/22   Margaretha Seeds, MD  amLODipine (NORVASC) 2.5 MG tablet TAKE 1 TABLET(2.5 MG) BY MOUTH DAILY 08/21/22   Skeet Latch, MD   Cholecalciferol (VITAMIN D) 50 MCG (2000 UT) tablet Take 2,000 Units by mouth daily.    [provider]  diphenhydrAMINE (BENADRYL) 25 MG tablet Take 25 mg by mouth daily as needed for allergies.    [provider]  famotidine (PEPCID) 20 MG tablet Take 20 mg by mouth daily.    [provider]  fluticasone-salmeterol (WIXELA INHUB) 250-50 MCG/ACT AEPB Inhale 1 puff into the lungs in the morning and at bedtime. 10/12/22   Margaretha Seeds, MD  gabapentin (NEURONTIN) 300 MG capsule Take 300-600 mg by mouth See admin instructions. Take 300 mg in the morning, 300 mg in the afternoon, and 600 mg at night 03/16/22   Margaretha Seeds, MD  HYDROcodone bit-homatropine [facility]) 5-1.5 MG/5ML syrup Take 5 mLs by mouth every 6 (six) hours as needed for cough. 10/12/22   Margaretha Seeds, MD  inFLIXimab (REMICADE IV) Inject 100 mg into the vein every 6 (six) weeks.    [provider]  irbesartan (AVAPRO) 300 MG tablet Take 1 tablet (300 mg total) by mouth daily. 07/09/18   Hennie Duos, MD  Magnesium 250 MG TABS Take 1 tablet (250 mg total) by mouth daily. 07/09/18   Hennie Duos, MD  Menthol, Topical Analgesic, (ICY HOT EX) Apply 1 Application topically daily as needed (pain). Patient not taking: Reported on 09/19/2022    [provider]  naloxegol oxalate (MOVANTIK) 12.5 MG TABS tablet Take 1 tablet (12.5 mg total) by mouth daily. 09/19/22   Zehr, Laban Emperor, PA-C  naproxen sodium (ALEVE) 220 MG tablet Take 440 mg by mouth daily as needed (pain).    [provider]  pantoprazole (PROTONIX) 40 MG tablet Take 40 mg by mouth daily. 10/15/21   [provider]  polyethylene glycol (MIRALAX / GLYCOLAX) 17 g packet Take 17 g by mouth daily as needed for moderate constipation.    [provider]  Propylene Glycol (SYSTANE BALANCE) 0.6 % SOLN Place 1 drop into both eyes 2 (two) times daily as needed (dry eyes).    [provider]   rosuvastatin (CRESTOR) 20 MG tablet Take 1 tablet (20 mg total) by mouth daily. NEED APPOINTMENT 10/12/22   Skeet Latch, MD  sertraline (ZOLOFT) 25 MG tablet Take 50 mg by mouth daily.     [provider]  tobramycin, PF, (TOBI) 300 MG/5ML nebulizer solution Take 5 mLs (300 mg total) by nebulization 2 (two) times daily. 09/28/21   Thayer Headings, MD  Varenicline Tartrate (TYRVAYA) 0.03 MG/ACT SOLN Place 1 spray into the nose daily.    [provider]  vitamin B-12 (CYANOCOBALAMIN) 100 MCG tablet Take 100 mcg by mouth daily.    [provider]      Allergies    Codeine, Tape, Latex, and Nickel    Review of Systems   Review of Systems  Physical Exam Updated Vital Signs BP (!) 143/62 (BP Location: Right Arm)   Pulse 76   Temp 98.7 F (37.1 C) (Oral)   Resp 18   Ht '5\' 2"'$  (1.575 m)   Wt 85.5 kg   SpO2 100%   BMI 34.48 kg/m  Physical Exam Constitutional:      Comments: Patient is alert nontoxic.  She is clinically well in appearance.  She does have clear mental status.  No respiratory distress.  HENT:     Head: Normocephalic.     Mouth/Throat:     Pharynx: Oropharynx is clear.  Eyes:     Extraocular Movements: Extraocular movements intact.  Cardiovascular:     Rate and Rhythm: Normal rate and regular rhythm.  Pulmonary:     Effort: Pulmonary effort is normal.     Breath sounds: Normal breath sounds.  Abdominal:     General: There is no distension.     Palpations: Abdomen is soft.  Musculoskeletal:     Comments: Normal visual inspection of the left and right shoulders.  No significant asymmetry, soft tissue changes.  Both hands are warm and dry.  Radial pulses 2+ and strong.  Patient can spontaneously use the left upper extremity.  She will move it and does use it to assist to sit up in the stretcher.  She does however endorse severe pain when trying to abduct more than about 30 degrees.  Normal visual inspection of the back.  Normal palpation.   Patient has a very well-healed midline low back surgical incision.  No focal point tenderness.  Skin:    General: Skin is warm and dry.  Neurological:     Comments: Patient is alert and interactive.  Cognitive function intact.  She does not appear to have any focal motor deficits.  She can use both upper extremities to pull herself forward in the stretcher although she needs some assistance due to pain and decreased global mobility.  Patient has use of both lower extremities.  Psychiatric:  Mood and Affect: Mood normal.     ED Results / Procedures / Treatments   Labs (all labs ordered are listed, but only abnormal results are displayed) Labs Reviewed - No data to display  EKG None  Radiology No results found.  Procedures Procedures    Medications Ordered in ED Medications  HYDROmorphone (DILAUDID) injection 1 mg (1 mg Intramuscular Given 10/21/22 0838)  predniSONE (DELTASONE) tablet 60 mg (60 mg Oral Given 10/21/22 [id number])    ED Course/ Medical Decision Making/ A&P                             Medical Decision Making Risk Prescription drug management.   Patient presents as outlined.  At this time appears consistent with acute on chronic pain.  Patient has a very long well-documented history of spinal stenosis and is undergoing rehab therapy.  At this time there do not appear to be any acute changes.  She describes a left radiculopathy down to about the knee which is consistent with prior episodes.  She also has rheumatoid arthritis and a lot of generalized pain but reports that her left shoulder pain is acutely worse and is limiting her ability to use the arm.  On physical examination, there is no motor weakness or evidence of swelling or vascular compromise.  She has significant pain with abduction consistent with either an impingement type pain or possibly frozen shoulder.  At this time I do not see any indication of joint infection and there is no trauma history.  Patient  has been on home pain management with narcotics and not getting relief.  She reports having Dilaudid at home but does not report using with any regularity.  At this time we will give patient 1 IM dose of Dilaudid in the emergency department for acute pain.  She denies having any recent course of steroids.  At this time with the combination of pain with spinal stenosis plus acute shoulder pain will treat with a brief burst of prednisone.  Patient is given an oral dose of prednisone in the emergency department will be continued on the 40 mg dose for the next 4 days.  I highly encourage close follow-up with PCP and rheumatology for monitoring of musculoskeletal pain and pain management.  Patient is in a safe environment with her daughter and son close at hand for caregiving and monitoring.  They are very involved in her care will be present for continued monitoring and assistance at home.        Final Clinical Impression(s) / ED Diagnoses Final diagnoses:  Acute pain of left shoulder  Chronic bilateral low back pain with left-sided sciatica    Rx / DC Orders ED Discharge Orders          Ordered    predniSONE (DELTASONE) 20 MG tablet  Daily        10/21/22 1012              Charlesetta Shanks, MD 10/21/22 1024

## 2022-10-21 NOTE — Discharge Instructions (Signed)
1.  Is very important that you follow-up with your doctor to discuss pain management.  Review your shoulder pain with your rheumatologist as well.  At this time the shoulder pain appears to be musculoskeletal possibly from the joints around the neck or in the shoulder.  You also have back pain that is chronic.  At this time, with a flare of shoulder pain and low back pain, will treat with prednisone for inflammation.  Also, you may take 1-2 of your Percocet tablets every 6 hours as needed for additional pain control.  You were given a stronger pain medicine in the emergency department for an acute pain episode.  Be very careful to watch for any dizziness, lightheadedness or oversedation.  Do not take additional pain medications at home until after lunch this afternoon if needed.  Continue the prescribed steroids tomorrow morning.  Call your family doctor and your rheumatologist to schedule follow-up after your assessment in the emergency department.

## 2022-10-21 NOTE — ED Triage Notes (Addendum)
Left shoulder pain and  all over  pain  started the last 2-3 weeks, no injury she states  has hx of copd has rx for gabapentin but has not taken it and was given vicodin x  1 last night at 11 pm

## 2022-10-21 NOTE — ED Notes (Signed)
Discharge instructions reviewed with patient. Patient verbalizes understanding, no further questions at this time. Medications/prescriptions and follow up information provided. No acute distress noted at time of departure.  

## 2022-10-21 NOTE — ED Notes (Signed)
Pt states feels much better family in room

## 2022-10-23 ENCOUNTER — Ambulatory Visit: Payer: Medicare Other | Admitting: Gastroenterology

## 2022-10-24 DIAGNOSIS — Z111 Encounter for screening for respiratory tuberculosis: Secondary | ICD-10-CM | POA: Diagnosis not present

## 2022-10-24 DIAGNOSIS — Z79899 Other long term (current) drug therapy: Secondary | ICD-10-CM | POA: Diagnosis not present

## 2022-10-24 DIAGNOSIS — R5383 Other fatigue: Secondary | ICD-10-CM | POA: Diagnosis not present

## 2022-10-24 DIAGNOSIS — M0589 Other rheumatoid arthritis with rheumatoid factor of multiple sites: Secondary | ICD-10-CM | POA: Diagnosis not present

## 2022-10-25 DIAGNOSIS — L309 Dermatitis, unspecified: Secondary | ICD-10-CM | POA: Diagnosis not present

## 2022-10-25 DIAGNOSIS — M069 Rheumatoid arthritis, unspecified: Secondary | ICD-10-CM | POA: Diagnosis not present

## 2022-10-25 DIAGNOSIS — M25512 Pain in left shoulder: Secondary | ICD-10-CM | POA: Diagnosis not present

## 2022-10-26 DIAGNOSIS — R2681 Unsteadiness on feet: Secondary | ICD-10-CM | POA: Diagnosis not present

## 2022-10-26 DIAGNOSIS — M791 Myalgia, unspecified site: Secondary | ICD-10-CM | POA: Diagnosis not present

## 2022-10-26 DIAGNOSIS — M7989 Other specified soft tissue disorders: Secondary | ICD-10-CM | POA: Diagnosis not present

## 2022-10-29 ENCOUNTER — Ambulatory Visit (HOSPITAL_BASED_OUTPATIENT_CLINIC_OR_DEPARTMENT_OTHER): Payer: Medicare Other | Admitting: Family

## 2022-11-02 ENCOUNTER — Ambulatory Visit (INDEPENDENT_AMBULATORY_CARE_PROVIDER_SITE_OTHER): Payer: Medicare Other | Admitting: Family

## 2022-11-02 ENCOUNTER — Encounter (HOSPITAL_BASED_OUTPATIENT_CLINIC_OR_DEPARTMENT_OTHER): Payer: Self-pay | Admitting: Family

## 2022-11-02 ENCOUNTER — Other Ambulatory Visit: Payer: Self-pay | Admitting: Cardiovascular Disease

## 2022-11-02 VITALS — BP 120/68 | HR 70 | Ht 62.0 in | Wt 189.0 lb

## 2022-11-02 DIAGNOSIS — I10 Essential (primary) hypertension: Secondary | ICD-10-CM | POA: Diagnosis not present

## 2022-11-02 DIAGNOSIS — I700 Atherosclerosis of aorta: Secondary | ICD-10-CM

## 2022-11-02 DIAGNOSIS — E785 Hyperlipidemia, unspecified: Secondary | ICD-10-CM

## 2022-11-02 NOTE — Addendum Note (Signed)
Addended by: Gerald Stabs on: 11/02/2022 12:58 PM   Modules accepted: Orders

## 2022-11-02 NOTE — Progress Notes (Signed)
Office Visit    Patient Name: Joy [name] Date of Encounter: 11/02/2022  PCP:  Marda Stalker, [place]  Cardiologist:  Skeet Latch, MD  Advanced Practice Provider:  No care team member to display Electrophysiologist:  None      Chief Complaint    Joy [name] is a 82 y.o. female presents today for hypertension follow-up  Past Medical History    Past Medical History:  Diagnosis Date   Ankylosing spondylitis ([place])    Anxiety    Arthritis    RHEUMATOID   Back pain    Bronchitis    Constipation    COPD (chronic obstructive pulmonary disease) ([place])    CXR 01/11/18 showed mild COPD and chronic bronchitis   CTS (carpal tunnel syndrome)    Dry eye    Dry mouth    Dysrhythmia    "irregularity" unknown at this time - being evaluated by cardiology   Essential hypertension 11/24/2015   GERD (gastroesophageal reflux disease)    HLA B27 (HLA B27 positive)    Hypercholesteremia    Hyperlipidemia 11/24/2015   Hypertension    IBS (irritable bowel syndrome)    Joint pain    Neuropathy    OAB (overactive bladder)    Obesity    Osteoarthritis    Osteoporosis    Pre-diabetes    Rheumatoid arthritis ([place]) 11/24/2015   Sciatica    Seasonal allergies    Spinal stenosis    Past Surgical History:  Procedure Laterality Date   ABDOMINAL HYSTERECTOMY  1995   BACK SURGERY     BREAST SURGERY     REDUCTION   CATARACT EXTRACTION W/PHACO  08/01/2012   Procedure: CATARACT EXTRACTION PHACO AND INTRAOCULAR LENS PLACEMENT ([place]);  Surgeon: Marylynn Pearson, MD;  Location: [facility];  Service: Ophthalmology;  Laterality: Right;   EYE SURGERY Bilateral    cataract   HERNIA REPAIR     RIGHT ING.   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 female with a hx of hypertension, hyperlipidemia, chronic bronchitis, aortic atherosclerosis, coronary calcification on CT, COPD, RA last seen 10/10/2021.  Established with cardiology 11/2015 for presurgical cardiovascular risk prior to knee surgery.  She had fallen and fractured her left tibial plateau on 09/02/2015.  EKG revealed nonspecific ST depression.  Nuclear stress testing 11/2015 LVEF 54%, no ischemia.  He reported palpitations were 24-hour monitor revealing PAC and PVC.  Seen for urgent visit 02/2021 for presyncope.  Echo 03/2021 LVEF 60 to [id number], normal diastolic function.  She had aortic sclerosis without stenosis.  At pharmacy visit 08/08/2021 amlodipine was initiated.  January 2023 lipids elevated and had not tolerated atorvastatin due to myalgias-she was started on rosuvastatin.  She saw Karren Cobble, [facility] 10/10/2021 with hypotension.  Amlodipine decreased to 2.5 mg daily and irbesartan 300 mg daily continued.  Presents today for follow-up. She is active at  her church and has worked in [employer].   She is ambulating with a cane due to orthopedic issues. Has persistent difficulties with sciatica most consistently on her side. Follows with neurosurgery. Notes injections have not been working well and is unsure if she wants to pursue surgery but is more open.   Notes her throat is sore and she's having trouble swallowing. Just started this past week. Does note this makes her more leery as recently lost a nephew to throat cancer. She did take Aleve this morning. Otherwise she has been using cough drops. Notes mild runny nose, but no sinus pressure. Describes as feeling swollen and some pressure. Not running a fever.   She monitors her blood pressure at home  with readings often less than 130. Reports no shortness of breath nor dyspnea on exertion. Reports no chest pain, pressure, or tightness. No edema, orthopnea, PND. Reports no palpitations.    EKGs/Labs/Other Studies Reviewed:   The following studies were reviewed today: Cardiac Studies & Procedures     STRESS TESTS  MYOCARDIAL PERFUSION IMAGING 12/02/2015  Narrative  Normal Lexiscan myoview  Nuclear stress EF: 54%.  There was no ST segment deviation noted during stress.  The study is normal.  This is a low risk study.   ECHOCARDIOGRAM  ECHOCARDIOGRAM COMPLETE 03/22/2021  Narrative ECHOCARDIOGRAM REPORT    Patient Name:   Joy [name] Date of Exam: 03/22/2021 Medical Rec #:  [id number]        Height:       62.0 in Accession #:    [id number]       Weight:       190.0 lb Date of Birth:  1941/03/02        BSA:          1.870 m Patient Age:    58 years         BP:           138/70 mmHg Patient Gender: F                HR:           65 bpm. Exam Location:  [address]  Procedure: 2D Echo, Cardiac Doppler, Color Doppler and 3D Echo  Indications:    R55 Syncope  History:        Patient has no prior history of Echocardiogram examinations. COPD, Signs/Symptoms:Syncope; Risk Factors:Hypertension, Dyslipidemia and Obesity.  Sonographer:    Coralyn Helling RDCS Referring Phys: [place]   1. Left ventricular ejection fraction, by estimation, is 60 to 65%. The left ventricle has normal function. The left ventricle has no regional wall motion abnormalities. There is mild left ventricular hypertrophy. Left ventricular diastolic parameters were normal. 2. Right ventricular systolic function is normal. The right ventricular size is normal. 3. Left atrial size was mildly dilated. 4. The mitral valve is abnormal. Trivial mitral valve regurgitation. No evidence of mitral stenosis. 5. The aortic valve is tricuspid. There is moderate calcification of the  aortic valve. Aortic valve regurgitation is not visualized. Mild to moderate aortic valve sclerosis/calcification is present, without any evidence of aortic stenosis. 6. The inferior vena cava is normal in size with greater than 50% respiratory variability, suggesting right atrial pressure of 3 mmHg.  FINDINGS Left Ventricle: Left ventricular ejection fraction, by estimation, is 60 to 65%. The left ventricle has normal function. The left ventricle has no regional wall motion abnormalities. The left ventricular internal cavity size was normal  in size. There is mild left ventricular hypertrophy. Left ventricular diastolic parameters were normal.  Right Ventricle: The right ventricular size is normal. No increase in right ventricular wall thickness. Right ventricular systolic function is normal.  Left Atrium: Left atrial size was mildly dilated.  Right Atrium: Right atrial size was normal in size.  Pericardium: There is no evidence of pericardial effusion.  Mitral Valve: The mitral valve is abnormal. There is mild thickening of the mitral valve leaflet(s). There is mild calcification of the mitral valve leaflet(s). Trivial mitral valve regurgitation. No evidence of mitral valve stenosis.  Tricuspid Valve: The tricuspid valve is normal in structure. Tricuspid valve regurgitation is mild . No evidence of tricuspid stenosis.  Aortic Valve: The aortic valve is tricuspid. There is moderate calcification of the aortic valve. Aortic valve regurgitation is not visualized. Mild to moderate aortic valve sclerosis/calcification is present, without any evidence of aortic stenosis.  Pulmonic Valve: The pulmonic valve was normal in structure. Pulmonic valve regurgitation is mild. No evidence of pulmonic stenosis.  Aorta: The aortic root is normal in size and structure.  Venous: The inferior vena cava is normal in size with greater than 50% respiratory variability, suggesting right atrial pressure of 3  mmHg.  IAS/Shunts: No atrial level shunt detected by color flow Doppler.   LEFT VENTRICLE PLAX 2D LVIDd:         4.80 cm Diastology LVIDs:         3.30 cm LV e' medial:    6.74 cm/s LV PW:         1.30 cm LV E/e' medial:  11.0 LV IVS:        1.30 cm LV e' lateral:   7.18 cm/s LV E/e' lateral: 10.3  3D Volume EF: 3D EF:        56 % LV EDV:       156 ml LV ESV:       68 ml LV SV:        88 ml  RIGHT VENTRICLE             IVC RV S prime:     13.80 cm/s  IVC diam: 1.20 cm TAPSE (M-mode): 2.0 cm RVSP:           25.7 mmHg  LEFT ATRIUM             Index       RIGHT ATRIUM           Index LA diam:        4.10 cm 2.19 cm/m  RA Pressure: 3.00 mmHg LA Vol (A2C):   80.2 ml 42.88 ml/m RA Area:     12.60 cm LA Vol (A4C):   52.3 ml 27.96 ml/m RA Volume:   31.70 ml  16.95 ml/m LA Biplane Vol: 65.6 ml 35.07 ml/m AORTIC VALVE LVOT Vmax:   80.30 cm/s LVOT Vmean:  49.900 cm/s LVOT VTI:    0.165 m  AORTA Ao Asc diam: 3.30 cm  MITRAL VALVE               TRICUSPID VALVE MV Area (PHT): 3.24 cm    TR Peak grad:   22.7 mmHg MV Decel Time: 234 msec    TR Vmax:        238.00 cm/s MV E velocity: 74.10 cm/s  Estimated RAP:  3.00 mmHg MV A velocity: 91.70 cm/s  RVSP:           25.7 mmHg MV E/A ratio:  0.81 SHUNTS Systemic VTI: 0.16 m  Jenkins Rouge MD Electronically signed by Jenkins Rouge MD Signature Date/Time: 03/22/2021/10:17:00 AM    Final              EKG:  EKG is  ordered today.  The ekg ordered today demonstrates NSR 70 bpm with sinus arrhthymia rate 70 bpm. No acute ST/T wave changes.   Recent Labs: No results found for requested labs within last 365 days.  Recent Lipid Panel No results found for: "CHOL", "TRIG", "HDL", "CHOLHDL", "VLDL", "LDLCALC", "LDLDIRECT"    Home Medications   Current Meds  Medication Sig   albuterol (VENTOLIN HFA) 108 (90 Base) MCG/ACT inhaler Inhale 2 puffs into the lungs every 4 (four) hours as needed for wheezing or shortness of breath.    amLODipine (NORVASC) 2.5 MG tablet TAKE 1 TABLET(2.5 MG) BY MOUTH DAILY   Cholecalciferol (VITAMIN D) 50 MCG (2000 UT) tablet Take 2,000 Units by mouth daily.   diphenhydrAMINE (BENADRYL) 25 MG tablet Take 25 mg by mouth daily as needed for allergies.   famotidine (PEPCID) 20 MG tablet Take 20 mg by mouth daily.   fluticasone-salmeterol (WIXELA INHUB) 250-50 MCG/ACT AEPB Inhale 1 puff into the lungs in the morning and at bedtime.   gabapentin (NEURONTIN) 300 MG capsule Take 300-600 mg by mouth See admin instructions. Take 300 mg in the morning, 300 mg in the afternoon, and 600 mg at night   HYDROcodone bit-homatropine (HYCODAN) 5-1.5 MG/5ML syrup Take 5 mLs by mouth every 6 (six) hours as needed for cough.   inFLIXimab (REMICADE IV) Inject 100 mg into the vein every 6 (six) weeks.   irbesartan (AVAPRO) 300 MG tablet Take 1 tablet (300 mg total) by mouth daily.   Magnesium 250 MG TABS Take 1 tablet (250 mg total) by mouth daily.   Menthol, Topical Analgesic, (ICY HOT EX) Apply 1 Application topically daily as needed (pain).   naloxegol oxalate (MOVANTIK) 12.5 MG TABS tablet Take 1 tablet (12.5 mg total) by mouth daily.   naproxen sodium (ALEVE) 220 MG tablet Take 440 mg by mouth daily as needed (pain).   pantoprazole (PROTONIX) 40 MG tablet Take 40 mg by mouth daily.   polyethylene glycol (MIRALAX / GLYCOLAX) 17 g packet Take 17 g by mouth daily as needed for moderate constipation.   predniSONE (DELTASONE) 20 MG tablet Take 2 tablets (40 mg total) by mouth daily.   Propylene Glycol (SYSTANE BALANCE) 0.6 % SOLN Place 1 drop into both eyes 2 (two) times daily as needed (dry eyes).   rosuvastatin (CRESTOR) 20 MG tablet Take 1 tablet (20 mg total) by mouth daily. NEED APPOINTMENT   sertraline (ZOLOFT) 25 MG tablet Take 50 mg by mouth daily.    tobramycin, PF, (TOBI) 300 MG/5ML nebulizer solution Take 5 mLs (300 mg total) by nebulization 2 (two) times daily.   Varenicline Tartrate (TYRVAYA) 0.03 MG/ACT  SOLN Place 1 spray into the nose daily.   vitamin B-12 (CYANOCOBALAMIN) 100 MCG tablet Take 100 mcg by mouth daily.     Review of Systems      All other systems reviewed and are otherwise negative except as noted above.  Physical Exam    VS:  BP 120/68   Pulse 70   Ht 5\' 2"  (1.575 m)   Wt 189 lb (85.7 kg)   BMI 34.57 kg/m  , BMI Body mass index is 34.57 kg/m.  Wt Readings from Last 3 Encounters:  11/02/22 189 lb (85.7 kg)  10/21/22  188 lb 7.9 oz (85.5 kg)  10/12/22 188 lb 7.9 oz (85.5 kg)    GEN: Well nourished, well developed, in no acute distress. HEENT: Right tonsil mildly swollen. No exudate. Neck: Supple, no JVD, carotid bruits, or masses. Cardiac: RRR, no murmurs, rubs, or gallops. No clubbing, cyanosis, edema.  Radials/PT 2+ and equal bilaterally.  Respiratory:  Respirations regular and unlabored, clear to auscultation bilaterally. GI: Soft, nontender, nondistended. MS: No deformity or atrophy. Skin: Warm and dry, no rash. Neuro:  Strength and sensation are intact. Psych: Normal affect.  Assessment & Plan    HTN- BP well controlled. Continue current antihypertensive regimen.    HLD, LDL goal <70 /aortic atherosclerosis /coronary calcification on CT - Stable with no anginal symptoms. No indication for ischemic evaluation.  Continue Rosuvastatin. Update lipid panel, LFT today.   Sore throat - <1 week history of sore throat with runny nose. Right tonsil swollen on exam with no exudate. Recommend continuing cough drops, Aleve BID x 3 days, and follow up with PCP regarding any persistent symptoms. Reassurance provided likely allergy related vs viral.  Sciatica - Limits physical activity. Continue to follow with neurosurgery. She is considering surgery - would be acceptable risk for surgery.         Disposition: Follow up in 6 month(s) with Skeet Latch, MD or APP.  Signed, Loel Dubonnet, NP 11/02/2022, 8:28 AM [place]

## 2022-11-02 NOTE — Patient Instructions (Addendum)
Medication Instructions:  Your physician has recommended you make the following change in your medication:   START Aleve two tablets twice per day for 3 days to help with sore throat. If needed you may take an extra dose at lunch time. If you have persistent soreness or other respiratory symptoms recommend discussing with your primary care provider.    *If you need a refill on your cardiac medications before your next appointment, please call your pharmacy*   Lab Work: Your physician recommends that you return for lab work today: liver enzymes, lipid panel  If you have labs (blood work) drawn today and your tests are completely normal, you will receive your results only by: [place] (if you have MyChart) OR A paper copy in the mail If you have any lab test that is abnormal or we need to change your treatment, we will call you to review the results.   Testing/Procedures: Your EKG today looked great! It showed normal sinus rhythm.   Follow-Up: At [facility], you and your health needs are our priority.  As part of our continuing mission to provide you with exceptional heart care, we have created designated Provider Care Teams.  These Care Teams include your primary Cardiologist (physician) and Advanced Practice Providers (APPs -  Physician Assistants and Nurse Practitioners) who all work together to provide you with the care you need, when you need it.  We recommend signing up for the patient portal called "MyChart".  Sign up information is provided on this After Visit Summary.  MyChart is used to connect with patients for Virtual Visits (Telemedicine).  Patients are able to view lab/test results, encounter notes, upcoming appointments, etc.  Non-urgent messages can be sent to your provider as well.   To learn more about what you can do with MyChart, go to [email or web address].    Your next appointment:   6-8 month(s)  Provider:   Skeet Latch, MD

## 2022-11-03 LAB — LIPID PANEL
Chol/HDL Ratio: 3 ratio (ref 0.0–4.4)
Cholesterol, Total: 157 mg/dL (ref 100–199)
HDL: 53 mg/dL (ref >39)
LDL Chol Calc (NIH): 92 mg/dL (ref 0–99)
Triglycerides: 58 mg/dL (ref 0–149)
VLDL Cholesterol Cal: 12 mg/dL (ref 5–40)

## 2022-11-03 LAB — HEPATIC FUNCTION PANEL
ALT: 9 IU/L (ref 0–32)
AST: 16 IU/L (ref 0–40)
Albumin: 3.5 g/dL — ABNORMAL LOW (ref 3.7–4.7)
Alkaline Phosphatase: 89 IU/L (ref 44–121)
Bilirubin Total: 0.4 mg/dL (ref 0.0–1.2)
Bilirubin, Direct: 0.14 mg/dL (ref 0.00–0.40)
Total Protein: 6.3 g/dL (ref 6.0–8.5)

## 2022-11-05 ENCOUNTER — Telehealth (HOSPITAL_BASED_OUTPATIENT_CLINIC_OR_DEPARTMENT_OTHER): Payer: Self-pay

## 2022-11-05 DIAGNOSIS — E785 Hyperlipidemia, unspecified: Secondary | ICD-10-CM

## 2022-11-05 MED ORDER — ROSUVASTATIN CALCIUM 40 MG PO TABS: 40.0000 mg | ORAL_TABLET | Freq: Every day | ORAL | 3 refills | Status: DC

## 2022-11-05 NOTE — Telephone Encounter (Addendum)
Results called to patient who verbalizes understanding! Labs ordered and mailed, prescriptions updated and sent to pharmacy on file     ----- Message from Loel Dubonnet, NP sent at 11/04/2022  9:31 PM EDT ----- Normal liver enzymes. LDL (bad cholesterol) of 92 which is not at goal of <70. Recommend increase Crestor to 40mg  daily with FLP/LFT in 2-3 months.

## 2022-11-06 DIAGNOSIS — Z79899 Other long term (current) drug therapy: Secondary | ICD-10-CM | POA: Diagnosis not present

## 2022-11-06 DIAGNOSIS — E669 Obesity, unspecified: Secondary | ICD-10-CM | POA: Diagnosis not present

## 2022-11-06 DIAGNOSIS — Z6835 Body mass index (BMI) 35.0-35.9, adult: Secondary | ICD-10-CM | POA: Diagnosis not present

## 2022-11-06 DIAGNOSIS — M1991 Primary osteoarthritis, unspecified site: Secondary | ICD-10-CM | POA: Diagnosis not present

## 2022-11-06 DIAGNOSIS — M5432 Sciatica, left side: Secondary | ICD-10-CM | POA: Diagnosis not present

## 2022-11-06 DIAGNOSIS — Z1589 Genetic susceptibility to other disease: Secondary | ICD-10-CM | POA: Diagnosis not present

## 2022-11-06 DIAGNOSIS — M0579 Rheumatoid arthritis with rheumatoid factor of multiple sites without organ or systems involvement: Secondary | ICD-10-CM | POA: Diagnosis not present

## 2022-11-08 DIAGNOSIS — R519 Headache, unspecified: Secondary | ICD-10-CM | POA: Diagnosis not present

## 2022-11-14 DIAGNOSIS — S060X0A Concussion without loss of consciousness, initial encounter: Secondary | ICD-10-CM | POA: Diagnosis not present

## 2022-11-15 DIAGNOSIS — R262 Difficulty in walking, not elsewhere classified: Secondary | ICD-10-CM | POA: Diagnosis not present

## 2022-11-15 DIAGNOSIS — M5432 Sciatica, left side: Secondary | ICD-10-CM | POA: Diagnosis not present

## 2022-11-21 DIAGNOSIS — M25561 Pain in right knee: Secondary | ICD-10-CM | POA: Diagnosis not present

## 2022-11-22 DIAGNOSIS — R262 Difficulty in walking, not elsewhere classified: Secondary | ICD-10-CM | POA: Diagnosis not present

## 2022-11-22 DIAGNOSIS — M5432 Sciatica, left side: Secondary | ICD-10-CM | POA: Diagnosis not present

## 2022-11-27 DIAGNOSIS — M25561 Pain in right knee: Secondary | ICD-10-CM | POA: Diagnosis not present

## 2022-11-27 DIAGNOSIS — R262 Difficulty in walking, not elsewhere classified: Secondary | ICD-10-CM | POA: Diagnosis not present

## 2022-11-27 DIAGNOSIS — M5432 Sciatica, left side: Secondary | ICD-10-CM | POA: Diagnosis not present

## 2022-11-28 DIAGNOSIS — M533 Sacrococcygeal disorders, not elsewhere classified: Secondary | ICD-10-CM | POA: Diagnosis not present

## 2022-11-28 DIAGNOSIS — Z6835 Body mass index (BMI) 35.0-35.9, adult: Secondary | ICD-10-CM | POA: Diagnosis not present

## 2022-11-29 ENCOUNTER — Ambulatory Visit: Payer: Medicare Other | Admitting: Gastroenterology

## 2022-11-30 DIAGNOSIS — M25552 Pain in left hip: Secondary | ICD-10-CM | POA: Diagnosis not present

## 2022-12-06 DIAGNOSIS — R7612 Nonspecific reaction to cell mediated immunity measurement of gamma interferon antigen response without active tuberculosis: Secondary | ICD-10-CM | POA: Diagnosis not present

## 2022-12-06 DIAGNOSIS — Z0000 Encounter for general adult medical examination without abnormal findings: Secondary | ICD-10-CM | POA: Diagnosis not present

## 2022-12-06 DIAGNOSIS — M0589 Other rheumatoid arthritis with rheumatoid factor of multiple sites: Secondary | ICD-10-CM | POA: Diagnosis not present

## 2022-12-06 DIAGNOSIS — E669 Obesity, unspecified: Secondary | ICD-10-CM | POA: Diagnosis not present

## 2022-12-06 DIAGNOSIS — Z1389 Encounter for screening for other disorder: Secondary | ICD-10-CM | POA: Diagnosis not present

## 2022-12-07 DIAGNOSIS — M25551 Pain in right hip: Secondary | ICD-10-CM | POA: Diagnosis not present

## 2022-12-07 DIAGNOSIS — M25561 Pain in right knee: Secondary | ICD-10-CM | POA: Diagnosis not present

## 2022-12-11 DIAGNOSIS — R7303 Prediabetes: Secondary | ICD-10-CM | POA: Diagnosis not present

## 2022-12-11 DIAGNOSIS — M5432 Sciatica, left side: Secondary | ICD-10-CM | POA: Diagnosis not present

## 2022-12-11 DIAGNOSIS — M069 Rheumatoid arthritis, unspecified: Secondary | ICD-10-CM | POA: Diagnosis not present

## 2022-12-11 DIAGNOSIS — F419 Anxiety disorder, unspecified: Secondary | ICD-10-CM | POA: Diagnosis not present

## 2022-12-11 DIAGNOSIS — E7800 Pure hypercholesterolemia, unspecified: Secondary | ICD-10-CM | POA: Diagnosis not present

## 2022-12-11 DIAGNOSIS — Z6834 Body mass index (BMI) 34.0-34.9, adult: Secondary | ICD-10-CM | POA: Diagnosis not present

## 2022-12-11 DIAGNOSIS — G629 Polyneuropathy, unspecified: Secondary | ICD-10-CM | POA: Diagnosis not present

## 2022-12-11 DIAGNOSIS — I700 Atherosclerosis of aorta: Secondary | ICD-10-CM | POA: Diagnosis not present

## 2022-12-11 DIAGNOSIS — R262 Difficulty in walking, not elsewhere classified: Secondary | ICD-10-CM | POA: Diagnosis not present

## 2022-12-11 DIAGNOSIS — Z1589 Genetic susceptibility to other disease: Secondary | ICD-10-CM | POA: Diagnosis not present

## 2022-12-11 DIAGNOSIS — M25561 Pain in right knee: Secondary | ICD-10-CM | POA: Diagnosis not present

## 2022-12-11 DIAGNOSIS — E559 Vitamin D deficiency, unspecified: Secondary | ICD-10-CM | POA: Diagnosis not present

## 2022-12-18 ENCOUNTER — Ambulatory Visit
Admission: RE | Admit: 2022-12-18 | Discharge: 2022-12-18 | Disposition: A | Discharge status: Home / Self Care | Payer: Medicare Other | Source: Ambulatory Visit | Attending: Pulmonary Disease | Admitting: Pulmonary Disease

## 2022-12-18 DIAGNOSIS — R059 Cough, unspecified: Secondary | ICD-10-CM | POA: Diagnosis not present

## 2022-12-18 DIAGNOSIS — I700 Atherosclerosis of aorta: Secondary | ICD-10-CM | POA: Diagnosis not present

## 2022-12-18 DIAGNOSIS — R911 Solitary pulmonary nodule: Secondary | ICD-10-CM

## 2022-12-18 NOTE — Op Note (Signed)
07/06/2022 lumbar Myelogram  PATIENT:  Joy [name] is a 82 y.o. female with pain in the lumbar region.  PRE-OPERATIVE DIAGNOSIS:  lumbar radiculpaty with spondylosis  POST-OPERATIVE DIAGNOSIS:  same  PROCEDURE:  Lumbar Myelogram  SURGEON:  Devontay Celaya  ANESTHESIA:   local LOCAL MEDICATIONS USED:  LIDOCAINE  Procedure Note: Joy [name] is a 82 y.o. female Was taken to the fluoroscopy suite and  positioned prone on the fluoroscopy table. Her back was prepared and draped in a sterile manner. I infiltrated 7 cc into the lumbar region. I then introduced a spinal needle into the thecal sac at the L2/320 interlaminar space. I infiltrated 20cc of Isovue 180 into the thecal sac. Fluoroscopy showed the needle and contrast in the thecal sac. Joy [name] tolerated the procedure well. she Will be taken to CT for evaluation.     PATIENT DISPOSITION:  Short Stay

## 2022-12-18 NOTE — Addendum Note (Signed)
Encounter addended by: Coletta Memos, MD on: 12/18/2022 11:05 AM  Actions taken: Clinical Note Signed

## 2022-12-20 ENCOUNTER — Encounter (HOSPITAL_BASED_OUTPATIENT_CLINIC_OR_DEPARTMENT_OTHER): Payer: Self-pay | Admitting: Pulmonary Disease

## 2022-12-20 ENCOUNTER — Ambulatory Visit (INDEPENDENT_AMBULATORY_CARE_PROVIDER_SITE_OTHER): Payer: Medicare Other | Admitting: Pulmonary Disease

## 2022-12-20 VITALS — BP 138/72 | HR 82 | Temp 98.2°F | Ht 62.0 in | Wt 184.6 lb

## 2022-12-20 DIAGNOSIS — J42 Unspecified chronic bronchitis: Secondary | ICD-10-CM

## 2022-12-20 NOTE — Patient Instructions (Signed)
Chronic bronchitis --Please restart Spiriva TWO puffs in the morning when able --Will also provide Stiolto TWO puffs in the morning for when you are out of Spiriva --CONTINUE Albuterol every 4 hours as needed for shortness of breath or wheezing --Continue Protonix 40 mg daily in setting of known hiatal hernia  RLL lung nodularity- improving from 14 mm>66mm Resolved RLL with cyst. Stable nodules including RUL and LUL Interval development of LUL nodules with largest 6mm. --Will contact you with final results by phone

## 2022-12-20 NOTE — Progress Notes (Signed)
Synopsis: Referred in 05/2018 for hx of chronic bronchitis x 2 years.   Subjective:   PATIENT ID: Joy [name] GENDER: female DOB: [birth year], MRN: [id number]   HPI  Chief Complaint  Patient presents with   Follow-up    Follow up on CT results.    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 Started on Spiriva 2020 - Improved symptoms on Spiriva however discontinued due to cost.  2022 - Restarted on Spiriva. August - Augmentin. September - steroids. November +pseudomonas treated x 3 weeks with persistent symptoms  10/02/21 She was seen by ID on 09/15/21 with Dr. Luciana Axe. CXR repeated with no active disease however given Pseudomonas colonization, inhaled tobramycin ordered. Has not picked it up yet and will need a nebulizer. She has remained compliant with Spiriva. She continues to have productive cough with brown sputum that is unchanged. Taking mucinex once a day. Denies shortness of breath or wheezing. Rarely uses rescue inhaler.  12/15/21 She presents for acute follow-up. Known pseudomonas colonization. She was seen by ID last month and unfortunately unable to start inhaled tobra due to financial limitations. Cultures remained sensitive to cipro so ID prescribed PO antibiotics on 10/27/21. She continues to have productive cough. Not as thick as before but coughing spells up to 40 min. Worse at night and causes her to feel fatigued. Denies shortness of breath or wheezing.  01/31/22 Since our last visit she was advised to start inhaled tobramycin. Started using mouthpiece and switched to facemask. She does not feel like it is effective. Compliant with her Anoro. Her cough remains deep and hard to expel sputum in the last few days. Increased wheezing and shortness of breath. Denies fevers, chills. She is having chronic back  pain and may need surgery later this year.   03/16/22 Since our last visit she completed antibiotic. She completed tobramycin.  Initially felt her symptoms improved until this month.  She has had recurrent episodes of bronchitis including twice July 2023. She currently has dark sputum with cough. Scant hemoptysis.  Denies wheezing.  Denies fevers or chills.  10/12/22 Since our last visit she has increased sputum production and increased rhinorrhea. Symptoms are manageable. Has run out of Breo for a few months due to cost. Has some leftover Spiriva.  12/20/22 She reports intermittent copious sputum production that occurs once a month at night. Denies fevers, chills. Breathing is better overall. Not currently on maintenance inhalers. Has some leftover Spiriva. No longer needing cough syrup  Social History: Quit smoking in 1995. Smoked for 30 years x 1ppd.   Past Medical History:  Diagnosis Date   Ankylosing spondylitis (HCC)    Anxiety    Arthritis    RHEUMATOID   Back pain    Bronchitis    Constipation    COPD (chronic obstructive pulmonary disease) (HCC)    CXR 01/11/18 showed mild COPD and chronic bronchitis   CTS (carpal tunnel syndrome)    Dry eye    Dry mouth    Dysrhythmia    "irregularity" unknown at this time - being evaluated by cardiology   Essential hypertension 11/24/2015   GERD (gastroesophageal reflux disease)    HLA B27 (HLA B27 positive)    Hypercholesteremia    Hyperlipidemia 11/24/2015   Hypertension    IBS (irritable bowel syndrome)    Joint pain  Neuropathy    OAB (overactive bladder)    Obesity    Osteoarthritis    Osteoporosis    Pre-diabetes    Rheumatoid arthritis (HCC) 11/24/2015   Sciatica    Seasonal allergies    Spinal stenosis     Allergies  Allergen Reactions   Codeine Other (See Comments)    Lump in throat   Tape Hives and Rash    Paper tape only   Latex Rash   Nickel Rash    Bumps Also other metals     Outpatient Medications Prior to  Visit  Medication Sig Dispense Refill   albuterol (VENTOLIN HFA) 108 (90 Base) MCG/ACT inhaler Inhale 2 puffs into the lungs every 4 (four) hours as needed for wheezing or shortness of breath. 8 g 6   amLODipine (NORVASC) 2.5 MG tablet TAKE 1 TABLET(2.5 MG) BY MOUTH DAILY 90 tablet 1   Cholecalciferol (VITAMIN D) 50 MCG (2000 UT) tablet Take 2,000 Units by mouth daily.     diphenhydrAMINE (BENADRYL) 25 MG tablet Take 25 mg by mouth daily as needed for allergies.     famotidine (PEPCID) 20 MG tablet Take 20 mg by mouth daily.     fluticasone-salmeterol (WIXELA INHUB) 250-50 MCG/ACT AEPB Inhale 1 puff into the lungs in the morning and at bedtime. 60 each 5   gabapentin (NEURONTIN) 300 MG capsule Take 300-600 mg by mouth See admin instructions. Take 300 mg in the morning, 300 mg in the afternoon, and 600 mg at night  1   HYDROcodone bit-homatropine (HYCODAN) 5-1.5 MG/5ML syrup Take 5 mLs by mouth every 6 (six) hours as needed for cough. 240 mL 0   inFLIXimab (REMICADE IV) Inject 100 mg into the vein every 6 (six) weeks.     irbesartan (AVAPRO) 300 MG tablet Take 1 tablet (300 mg total) by mouth daily. 30 tablet 0   Magnesium 250 MG TABS Take 1 tablet (250 mg total) by mouth daily. 30 tablet 0   Menthol, Topical Analgesic, (ICY HOT EX) Apply 1 Application topically daily as needed (pain).     naloxegol oxalate (MOVANTIK) 12.5 MG TABS tablet Take 1 tablet (12.5 mg total) by mouth daily. 30 tablet 1   naproxen sodium (ALEVE) 220 MG tablet Take 440 mg by mouth daily as needed (pain).     pantoprazole (PROTONIX) 40 MG tablet Take 40 mg by mouth daily.     polyethylene glycol (MIRALAX / GLYCOLAX) 17 g packet Take 17 g by mouth daily as needed for moderate constipation.     predniSONE (DELTASONE) 20 MG tablet Take 2 tablets (40 mg total) by mouth daily. 6 tablet 0   Propylene Glycol (SYSTANE BALANCE) 0.6 % SOLN Place 1 drop into both eyes 2 (two) times daily as needed (dry eyes).     rosuvastatin (CRESTOR)  40 MG tablet Take 1 tablet (40 mg total) by mouth daily. NEED APPOINTMENT 90 tablet 3   sertraline (ZOLOFT) 25 MG tablet Take 50 mg by mouth daily.      tiotropium (SPIRIVA) 18 MCG inhalation capsule Place 18 mcg into inhaler and inhale daily.     tobramycin, PF, (TOBI) 300 MG/5ML nebulizer solution Take 5 mLs (300 mg total) by nebulization 2 (two) times daily. 280 mL 0   umeclidinium-vilanterol (ANORO ELLIPTA) 62.5-25 MCG/ACT AEPB Inhale 1 puff into the lungs daily.     Varenicline Tartrate (TYRVAYA) 0.03 MG/ACT SOLN Place 1 spray into the nose daily.     vitamin B-12 (CYANOCOBALAMIN) 100 MCG  tablet Take 100 mcg by mouth daily.     No facility-administered medications prior to visit.    Review of Systems  Constitutional:  Negative for chills, diaphoresis, fever, malaise/fatigue and weight loss.  HENT:  Negative for congestion.   Respiratory:  Positive for sputum production. Negative for cough, hemoptysis, shortness of breath and wheezing.   Cardiovascular:  Negative for chest pain, palpitations and leg swelling.    Objective:   Vitals:   12/20/22 1126  BP: 138/72  Pulse: 82  Temp: 98.2 F (36.8 C)  TempSrc: Oral  SpO2: 96%  Weight: 184 lb 9.6 oz (83.7 kg)  Height: 5\' 2"  (1.575 m)   Physical Exam: General: Well-appearing, no acute distress HENT: [place], AT Eyes: EOMI, no scleral icterus Respiratory: Clear to auscultation bilaterally.  No crackles, wheezing or rales Cardiovascular: RRR, -M/R/G, no JVD Extremities:-Edema,-tenderness Neuro: AAO x4, CNII-XII grossly intact Psych: Normal mood, normal affect  Chest imaging: CXR 01/11/18 - No pulmonary edema, effusion or infiltrate CT HR 07/28/21 - RLL nodule 1.4 x 1 and RUL ground glas nodule. Otherwise normal parenchyma. Moderate/large hiatal hernia. 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 Unchanged coarse interstitial markings suggestive of chronic bronchitis CT  Chest 12/21/21 - Right lower lobe nodule decreased from 14mm to 9 mm. Stable GGO. CT Chest 06/07/22 - Resolved RLL with cyst. Stable nodules including RUL and LUL Interval development of LUL nodules with largest 6mm. CT Chest 12/18/22 - Stable RLL lung nodule. Resolved nodules mentioned before  PFT:  08/21/18  FVC 2.4 (154%) FEV1 2.21 (156%) Ratio 74 TLC 104% DLCO corrected 78% Interpretation: Normal spirometry and lung volumes with mildly reduced DLCO  Mild obstructive defect present with mildly reduced DLCO. TLC normal. No significant bronchodilator effect present however does not preclude benefit of bronchodilator therapy.  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 Improved symptoms off bronchodilators however patient with high risk of recurrent flares. Advised to restart inhalers when able. Currently cost prohibitive so she has not purchased any except for SABA. She does have leftover samples at home and active rx for wixela at [facility].  Pseudomonas colonization - s/p tobramycin. No persistent active symptoms Immunosuppressed patient --Completed tobramycin --If persistent PA, may need to refer to ID again for extended tobra  Chronic bronchitis - intermittent sputum production --Please restart Spiriva TWO puffs in the morning when able --Will also provide Stiolto TWO puffs in the morning for when you are out of Spiriva --CONTINUE Albuterol every 4 hours as needed for shortness of breath or wheezing --Continue Protonix 40 mg daily in setting of known hiatal hernia  RLL lung nodularity- stable 9mm Interval development of RUL 5mm ground glass nodule - likely benign History of waxing and waning nodules. Suspect all benign in currently  asymptomatic patient --Will contact you with final results by phone  ADDENDUM: CT with resolved prior nodules and unchanged RLL nodule After discussion regarding risks and benefits, would favor to stop surveillance as we would likely not pursue intervention unless her symptoms worsened. No further chest imaging indicated  Immunization History  Administered Date(s) Administered   Fluad Quad(high Dose 65+) 04/21/2019, 05/20/2021   Influenza Split 05/22/2012, 04/16/2014   Influenza, High Dose Seasonal PF 05/20/2018, 05/24/2020   Influenza-Unspecified 04/26/2015, 06/16/2019  PFIZER(Purple Top)SARS-COV-2 Vaccination 09/11/2019, 09/25/2019, 05/14/2020, 11/25/2020   Pfizer Covid-19 Vaccine Bivalent Booster 13yrs & up 05/08/2021   Pneumococcal Conjugate-13 04/16/2014   Pneumococcal Polysaccharide-23 04/26/2015   Tdap 10/19/2014   No orders of the defined types were placed in this encounter.  No orders of the defined types were placed in this encounter.  Return in about 4 months (around 04/22/2023).  I have spent a total time of 35-minutes on the day of the appointment including chart review, data review, collecting history, coordinating care and discussing medical diagnosis and plan with the patient/family. Past medical history, allergies, medications were reviewed. Pertinent imaging, labs and tests included in this note have been reviewed and interpreted independently by me.  Rajeev Escue Mechele Collin, MD [facility] 12/20/2022

## 2022-12-21 ENCOUNTER — Other Ambulatory Visit (HOSPITAL_BASED_OUTPATIENT_CLINIC_OR_DEPARTMENT_OTHER): Payer: Self-pay | Admitting: Pulmonary Disease

## 2022-12-25 DIAGNOSIS — M5432 Sciatica, left side: Secondary | ICD-10-CM | POA: Diagnosis not present

## 2022-12-25 DIAGNOSIS — R262 Difficulty in walking, not elsewhere classified: Secondary | ICD-10-CM | POA: Diagnosis not present

## 2022-12-25 DIAGNOSIS — M25561 Pain in right knee: Secondary | ICD-10-CM | POA: Diagnosis not present

## 2022-12-25 MED ORDER — HYDROCODONE BIT-HOMATROP MBR 5-1.5 MG/5ML PO SOLN: 5.0000 mL | Freq: Four times a day (QID) | ORAL | 0 refills | Status: DC | PRN

## 2023-01-01 DIAGNOSIS — M5432 Sciatica, left side: Secondary | ICD-10-CM | POA: Diagnosis not present

## 2023-01-01 DIAGNOSIS — R262 Difficulty in walking, not elsewhere classified: Secondary | ICD-10-CM | POA: Diagnosis not present

## 2023-01-01 DIAGNOSIS — M25561 Pain in right knee: Secondary | ICD-10-CM | POA: Diagnosis not present

## 2023-01-11 DIAGNOSIS — M25551 Pain in right hip: Secondary | ICD-10-CM | POA: Diagnosis not present

## 2023-01-11 DIAGNOSIS — Z96651 Presence of right artificial knee joint: Secondary | ICD-10-CM | POA: Diagnosis not present

## 2023-01-17 DIAGNOSIS — M0589 Other rheumatoid arthritis with rheumatoid factor of multiple sites: Secondary | ICD-10-CM | POA: Diagnosis not present

## 2023-01-21 DIAGNOSIS — M461 Sacroiliitis, not elsewhere classified: Secondary | ICD-10-CM | POA: Diagnosis not present

## 2023-01-21 DIAGNOSIS — R6884 Jaw pain: Secondary | ICD-10-CM | POA: Diagnosis not present

## 2023-01-21 DIAGNOSIS — K143 Hypertrophy of tongue papillae: Secondary | ICD-10-CM | POA: Diagnosis not present

## 2023-01-21 DIAGNOSIS — Z6833 Body mass index (BMI) 33.0-33.9, adult: Secondary | ICD-10-CM | POA: Diagnosis not present

## 2023-01-21 DIAGNOSIS — K219 Gastro-esophageal reflux disease without esophagitis: Secondary | ICD-10-CM | POA: Diagnosis not present

## 2023-01-30 DIAGNOSIS — Z6833 Body mass index (BMI) 33.0-33.9, adult: Secondary | ICD-10-CM | POA: Diagnosis not present

## 2023-01-30 DIAGNOSIS — M461 Sacroiliitis, not elsewhere classified: Secondary | ICD-10-CM | POA: Diagnosis not present

## 2023-02-04 DIAGNOSIS — Z1231 Encounter for screening mammogram for malignant neoplasm of breast: Secondary | ICD-10-CM | POA: Diagnosis not present

## 2023-02-14 ENCOUNTER — Other Ambulatory Visit (HOSPITAL_COMMUNITY): Payer: Self-pay | Admitting: Orthopedic Surgery

## 2023-02-14 DIAGNOSIS — Z96651 Presence of right artificial knee joint: Secondary | ICD-10-CM | POA: Diagnosis not present

## 2023-02-14 DIAGNOSIS — Z471 Aftercare following joint replacement surgery: Secondary | ICD-10-CM | POA: Diagnosis not present

## 2023-02-24 ENCOUNTER — Other Ambulatory Visit: Payer: Self-pay | Admitting: Cardiovascular Disease

## 2023-02-24 DIAGNOSIS — I10 Essential (primary) hypertension: Secondary | ICD-10-CM

## 2023-02-25 NOTE — Telephone Encounter (Signed)
Rx(s) sent to pharmacy electronically.  

## 2023-02-27 DIAGNOSIS — K219 Gastro-esophageal reflux disease without esophagitis: Secondary | ICD-10-CM | POA: Diagnosis not present

## 2023-02-27 DIAGNOSIS — K143 Hypertrophy of tongue papillae: Secondary | ICD-10-CM | POA: Diagnosis not present

## 2023-02-27 DIAGNOSIS — J42 Unspecified chronic bronchitis: Secondary | ICD-10-CM | POA: Diagnosis not present

## 2023-02-27 DIAGNOSIS — T17308A Unspecified foreign body in larynx causing other injury, initial encounter: Secondary | ICD-10-CM | POA: Diagnosis not present

## 2023-02-27 DIAGNOSIS — R6884 Jaw pain: Secondary | ICD-10-CM | POA: Diagnosis not present

## 2023-02-27 DIAGNOSIS — Z6831 Body mass index (BMI) 31.0-31.9, adult: Secondary | ICD-10-CM | POA: Diagnosis not present

## 2023-02-28 DIAGNOSIS — M0589 Other rheumatoid arthritis with rheumatoid factor of multiple sites: Secondary | ICD-10-CM | POA: Diagnosis not present

## 2023-03-01 ENCOUNTER — Ambulatory Visit (HOSPITAL_COMMUNITY)
Admission: RE | Admit: 2023-03-01 | Discharge: 2023-03-01 | Disposition: A | Discharge status: Home / Self Care | Payer: Medicare Other | Source: Ambulatory Visit | Attending: Orthopedic Surgery | Admitting: Orthopedic Surgery

## 2023-03-01 ENCOUNTER — Encounter (HOSPITAL_COMMUNITY)
Admission: RE | Admit: 2023-03-01 | Discharge: 2023-03-01 | Disposition: A | Discharge status: Home / Self Care | Payer: Medicare Other | Source: Ambulatory Visit | Attending: Orthopedic Surgery | Admitting: Orthopedic Surgery

## 2023-03-01 DIAGNOSIS — Z96651 Presence of right artificial knee joint: Secondary | ICD-10-CM | POA: Diagnosis not present

## 2023-03-01 DIAGNOSIS — Z96641 Presence of right artificial hip joint: Secondary | ICD-10-CM | POA: Diagnosis not present

## 2023-03-01 DIAGNOSIS — Z96653 Presence of artificial knee joint, bilateral: Secondary | ICD-10-CM | POA: Diagnosis not present

## 2023-03-01 DIAGNOSIS — M25569 Pain in unspecified knee: Secondary | ICD-10-CM | POA: Diagnosis not present

## 2023-03-01 MED ORDER — TECHNETIUM TC 99M MEDRONATE IV KIT
20.0000 | PACK | Freq: Once | INTRAVENOUS | Status: AC | PRN
  Administered 2023-03-01: 21.2 via INTRAVENOUS

## 2023-03-26 ENCOUNTER — Other Ambulatory Visit (HOSPITAL_BASED_OUTPATIENT_CLINIC_OR_DEPARTMENT_OTHER): Payer: Self-pay | Admitting: Pulmonary Disease

## 2023-03-28 DIAGNOSIS — Z96651 Presence of right artificial knee joint: Secondary | ICD-10-CM | POA: Diagnosis not present

## 2023-03-28 DIAGNOSIS — M25561 Pain in right knee: Secondary | ICD-10-CM | POA: Diagnosis not present

## 2023-04-04 ENCOUNTER — Telehealth: Payer: Self-pay | Admitting: Pulmonary Disease

## 2023-04-04 NOTE — Telephone Encounter (Signed)
Patient states having symptom of cough. Would like cough syrup called into pharmacy. Pharmacy is [facility].Patient phone number is [phone].

## 2023-04-04 NOTE — Telephone Encounter (Signed)
Spoke with the pt  She is c/o cough with clear to brown sputum over the past 1-2 wks  She is not having any wheezing, SOB, fevers, aches  She is taking her spiriva and albuterol  Has not tried anything otc  Requesting cough med to be called in  Please advise, thanks!

## 2023-04-05 ENCOUNTER — Encounter (HOSPITAL_BASED_OUTPATIENT_CLINIC_OR_DEPARTMENT_OTHER): Payer: Self-pay | Admitting: Pulmonary Disease

## 2023-04-05 MED ORDER — PREDNISONE 20 MG PO TABS: 40.0000 mg | ORAL_TABLET | Freq: Every day | ORAL | 0 refills | Status: AC

## 2023-04-05 MED ORDER — HYDROCODONE BIT-HOMATROP MBR 5-1.5 MG/5ML PO SOLN: 5.0000 mL | Freq: Four times a day (QID) | ORAL | 0 refills | Status: DC | PRN

## 2023-04-05 MED ORDER — AMOXICILLIN-POT CLAVULANATE 875-125 MG PO TABS: 1.0000 | ORAL_TABLET | Freq: Two times a day (BID) | ORAL | 0 refills | Status: DC

## 2023-04-05 NOTE — Telephone Encounter (Signed)
Compliant with Spiriva and albuterol. Increased sputum production.  Acute on chronic bronchitis. Immunosuppressed. Hx PA colonization Rx prednisone 40 mg, Augmentin, cough syrup with codeine If symptoms persist would recommend follow-up visit in clinic with NP or next available physician

## 2023-04-05 NOTE — Telephone Encounter (Signed)
Refilled cough syrup on 04/05/23 telephone encounter

## 2023-04-09 ENCOUNTER — Telehealth: Payer: Self-pay | Admitting: Gastroenterology

## 2023-04-09 NOTE — Telephone Encounter (Signed)
PT daughter is calling to find out options to help her mom to not keep choking. She is even now choking on her medications. Please advise.

## 2023-04-10 NOTE — Telephone Encounter (Signed)
The pt has a history of 2 months of dysphagia- food and pills get stuck and she has to cough up food or pills that get stuck.   She also has weight loss due to the dysphagia   25 lbs over the past 4 months. An app t wa made for Sept to see Bayley but I did find a sooner appt for next week with Gunnar Fusi. The pt daughter agrees to the change and will bring the pt in next Wednesday. She will continue to eat soft or liquids and be mindful about chewing.

## 2023-04-10 NOTE — Telephone Encounter (Signed)
Left message on machine to call back  

## 2023-04-11 DIAGNOSIS — M0589 Other rheumatoid arthritis with rheumatoid factor of multiple sites: Secondary | ICD-10-CM | POA: Diagnosis not present

## 2023-04-11 DIAGNOSIS — Z79899 Other long term (current) drug therapy: Secondary | ICD-10-CM | POA: Diagnosis not present

## 2023-04-17 ENCOUNTER — Ambulatory Visit (INDEPENDENT_AMBULATORY_CARE_PROVIDER_SITE_OTHER): Payer: Medicare Other | Admitting: Nurse Practitioner

## 2023-04-17 ENCOUNTER — Encounter: Payer: Self-pay | Admitting: Nurse Practitioner

## 2023-04-17 VITALS — BP 140/76 | HR 76 | Ht 62.0 in | Wt 165.0 lb

## 2023-04-17 DIAGNOSIS — K5900 Constipation, unspecified: Secondary | ICD-10-CM | POA: Diagnosis not present

## 2023-04-17 DIAGNOSIS — K219 Gastro-esophageal reflux disease without esophagitis: Secondary | ICD-10-CM | POA: Diagnosis not present

## 2023-04-17 DIAGNOSIS — R1310 Dysphagia, unspecified: Secondary | ICD-10-CM

## 2023-04-17 NOTE — Progress Notes (Signed)
ASSESSMENT & PLAN   82 y.o.  female known to [facility] for a history of GERD / hiatal hernia,  esophageal web  Recurrent intermittent dysphagia (solids and liquids ) over the last 2 months .  Rule out recurrent esophageal web,  ule out esophageal stricture.  Symptoms could be secondary to dysmotility . Symptoms described as being the same as when she underwent EGD in 2022.   -- She is chewing well but scared of "choking" She is interested in another esophageal dilation . Schedule for EGD with possible esophageal dilation. The risks and benefits of EGD with possible biopsies /esophageal dilation were discussed with the patient who agrees to proceed.  --Swallowing precautions discussed. Advised to eat slowly, chew food well before swallowing. Drink  liquids in between each bite to avoid food impaction.  Chronic GERD.  Overall symptoms have been controlled with daily pantoprazole  She has been getting some recent heartburn associated  episodes of dysphagia -- Continue daily pantoprazole 30 minutes before breakfast -- Famotidine is on home med list but she is not taking it so will removed from the list  Significant weight loss over the last few months. Down 19 pounds since early May.  She attributes the recent weight loss to diminished appetite related to feeling sad about the death of some of her friends.  She is feeling better with improvement in appetite.  She does not want to regain any of the weight -- Further workup if weight loss progresses  Constipation with decreased frequency of BM.  -Hasn't been taking Miralax on a regular basis. Recommend she try increaseing Miralax to daily dosing.    COPD / Rheumatoid arthritis on infliximab.  See PMH below for additional history  HPI   Brief GI history Patient was last seen January 2024 by Doug Sou, PA for evaluation of abdominal pain and constipation  Chief complaint : Swallowing problems  Ms Dubose has been having recurrent  dysphagia to pills, solids and sometimes liquids over the last couple of months.  Symptoms are intermittent.  She chews her food well but is still scared that she might " choke" .  No odynophagia . Symptoms are the same as when she evaluated here in 2022 and underwent EGD with dilation.  Her symptoms did improve post dilation  Ms. Clark has chronic GERD.  Symptoms have been controlled on daily pantoprazole though having occasional breakthrough heartburn associated with the episodes of dysphagia.  Pepcid on home med list but doesn't take it.    Previous GI Endoscopies / Labs / Imaging   **May not include all endoscopic evaluations   June 2022 EGD for evaluation of dysphagia and abnormal upper GI series (cervical stricture) -A web was found in the lower 1/3 of  esophagus.  Biopsies taken.  Mucosal changes including ringed esophagus,  small caliber of esophagus and punctate spots in entire esophagus.  Dilation was performed with a Maloney dilator.  4 cm hiatal hernia.  Exam otherwise normal.  Surgical [P], esophageal bx - REACTIVE SQUAMOUS MUCOSA WITH INCREASED INTRAEPITHELIAL LYMPHOCYTES AND RARE EOSINOPHILS - SEE COMMENT      Latest Ref Rng & Units 11/02/2022    8:57 AM 09/30/2019   11:48 AM 12/16/2015   10:20 AM  Hepatic Function  Total Protein 6.0 - 8.5 g/dL 6.3  6.5  6.4   Albumin 3.7 - 4.7 g/dL 3.5  4.0  3.9   AST 0 - 40 IU/L 16  18  26    ALT  0 - 32 IU/L 9  10  16    Alk Phosphatase 44 - 121 IU/L 89  72  76   Total Bilirubin 0.0 - 1.2 mg/dL 0.4  0.5  0.5   Bilirubin, Direct 0.00 - 0.40 mg/dL 5.78          Latest Ref Rng & Units 08/31/2021    9:43 AM 02/15/2021   10:31 PM 02/01/2020   11:15 AM  CBC  WBC 3.8 - 10.8 Thousand/uL 4.3  4.3  7.6   Hemoglobin 11.7 - 15.5 g/dL 46.9  62.9  52.8   Hematocrit 35.0 - 45.0 % 39.7  36.1  38.5   Platelets 140 - 400 Thousand/uL 253  233  265    Echo Aug 2022 IMPRESSIONS Left ventricular ejection fraction, by estimation, is 60 to 65%. The left  ventricle has normal function. The left ventricle has no regional wall motion abnormalities. There is mild left ventricular hypertrophy. Left ventricular diastolic parameters were normal. 1. 2. Right ventricular systolic function is normal. The right ventricular size is normal. 3. Left atrial size was mildly dilated. The mitral valve is abnormal. Trivial mitral valve regurgitation. No evidence of mitral stenosis. 4. The aortic valve is tricuspid. There is moderate calcification of the aortic valve. Aortic valve regurgitation is not visualized. Mild to moderate aortic valve sclerosis/calcification is present, without any evidence of aortic stenosis. 5. The inferior vena cava is normal in size with greater than 50% respiratory variability, suggesting right atrial pressure of 3 mmHg.   Past Medical History:  Diagnosis Date   Ankylosing spondylitis (HCC)    Anxiety    Arthritis    RHEUMATOID   Back pain    Bronchitis    Constipation    COPD (chronic obstructive pulmonary disease) (HCC)    CXR 01/11/18 showed mild COPD and chronic bronchitis   CTS (carpal tunnel syndrome)    Dry eye    Dry mouth    Dysrhythmia    "irregularity" unknown at this time - being evaluated by cardiology   Essential hypertension 11/24/2015   GERD (gastroesophageal reflux disease)    HLA B27 (HLA B27 positive)    Hypercholesteremia    Hyperlipidemia 11/24/2015   Hypertension    IBS (irritable bowel syndrome)    Joint pain    Neuropathy    OAB (overactive bladder)    Obesity    Osteoarthritis    Osteoporosis    Pre-diabetes    Rheumatoid arthritis (HCC) 11/24/2015   Sciatica    Seasonal allergies    Spinal stenosis     Past Surgical History:  Procedure Laterality Date   ABDOMINAL HYSTERECTOMY  1995   BACK SURGERY     BREAST SURGERY     REDUCTION   CATARACT EXTRACTION W/PHACO  08/01/2012   Procedure: CATARACT EXTRACTION PHACO AND INTRAOCULAR LENS PLACEMENT (IOC);  Surgeon: Chalmers Guest, MD;  Location: [facility] OR;   Service: Ophthalmology;  Laterality: Right;   EYE SURGERY Bilateral    cataract   HERNIA REPAIR     RIGHT ING.   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   Procedure: TOTAL KNEE ARTHROPLASTY;  Surgeon: Ollen Gross, MD;  Location: [facility] ORS;  Service: Orthopedics;  Laterality: Left;    Family History  Problem Relation Age of Onset   Heart disease Mother    Cerebral aneurysm Mother    Hypertension Mother    Obesity Mother  Heart attack Father    Heart disease Father    Hypertension Father    Hypertension Sister    Arthritis Sister    Stroke Sister    Hypertension Sister    Arthritis Sister    Hypertension Sister    Arthritis Sister    Hypertension Sister    Diabetes Sister    Hypertension Sister    Diabetes Sister    Rheum arthritis Maternal Grandmother    Heart attack Daughter     Current Medications, Allergies, Family History and Social History were reviewed in [employer] electronic medical record.     Current Outpatient Medications  Medication Sig Dispense Refill   albuterol (VENTOLIN HFA) 108 (90 Base) MCG/ACT inhaler Inhale 2 puffs into the lungs every 4 (four) hours as needed for wheezing or shortness of breath. 8 g 6   amLODipine (NORVASC) 2.5 MG tablet TAKE 1 TABLET(2.5 MG) BY MOUTH DAILY 90 tablet 2   Cholecalciferol (VITAMIN D) 50 MCG (2000 UT) tablet Take 2,000 Units by mouth daily.     diphenhydrAMINE (BENADRYL) 25 MG tablet Take 25 mg by mouth daily as needed for allergies.     fluticasone-salmeterol (WIXELA INHUB) 250-50 MCG/ACT AEPB Inhale 1 puff into the lungs in the morning and at bedtime. 60 each 5   gabapentin (NEURONTIN) 300 MG capsule Take 300-600 mg by mouth See admin instructions. Take 300 mg in the morning, 300 mg in the afternoon, and 600 mg at night  1   inFLIXimab (REMICADE IV) Inject 100 mg into the vein every 6 (six) weeks.     irbesartan (AVAPRO) 300 MG tablet Take 1 tablet (300  mg total) by mouth daily. 30 tablet 0   Magnesium 250 MG TABS Take 1 tablet (250 mg total) by mouth daily. 30 tablet 0   Menthol, Topical Analgesic, (ICY HOT EX) Apply 1 Application topically daily as needed (pain).     naproxen sodium (ALEVE) 220 MG tablet Take 440 mg by mouth daily as needed (pain).     pantoprazole (PROTONIX) 40 MG tablet Take 40 mg by mouth daily.     polyethylene glycol (MIRALAX / GLYCOLAX) 17 g packet Take 17 g by mouth daily as needed for moderate constipation.     Propylene Glycol (SYSTANE BALANCE) 0.6 % SOLN Place 1 drop into both eyes 2 (two) times daily as needed (dry eyes).     rosuvastatin (CRESTOR) 40 MG tablet Take 1 tablet (40 mg total) by mouth daily. NEED APPOINTMENT 90 tablet 3   sertraline (ZOLOFT) 25 MG tablet Take 50 mg by mouth daily.      tiotropium (SPIRIVA) 18 MCG inhalation capsule Place 18 mcg into inhaler and inhale daily.     tobramycin, PF, (TOBI) 300 MG/5ML nebulizer solution Take 5 mLs (300 mg total) by nebulization 2 (two) times daily. 280 mL 0   Varenicline Tartrate (TYRVAYA) 0.03 MG/ACT SOLN Place 1 spray into the nose daily.     vitamin B-12 (CYANOCOBALAMIN) 100 MCG tablet Take 100 mcg by mouth daily.     No current facility-administered medications for this visit.    Review of Systems: No chest pain. No shortness of breath. No urinary complaints.    Physical Exam  Wt Readings from Last 3 Encounters:  12/20/22 184 lb 9.6 oz (83.7 kg)  11/02/22 189 lb (85.7 kg)  10/21/22 188 lb 7.9 oz (85.5 kg)    BP (!) 140/76   Pulse 76   Ht 5\' 2"  (1.575 m)  Wt 165 lb (74.8 kg)   BMI 30.18 kg/m  Constitutional:  Pleasant, generally well appearing female in no acute distress. Psychiatric: Normal mood and affect. Behavior is normal. EENT: Pupils normal.  Conjunctivae are normal. No scleral icterus. Neck supple.  Cardiovascular: Normal rate, regular rhythm.  Pulmonary/chest: Effort normal and breath sounds normal. No wheezing, rales or  rhonchi. Abdominal: Soft, nondistended, nontender. Bowel sounds active throughout. There are no masses palpable. No hepatomegaly. Neurological: Alert and oriented to person place and time.    Willette Cluster, NP  04/17/2023, 8:20 AM

## 2023-04-17 NOTE — Patient Instructions (Signed)
_______________________________________________________  If your blood pressure at your visit was 140/90 or greater, please contact your primary care physician to follow up on this. _______________________________________________________  If you are age 84 or older, your body mass index should be between 23-30. Your Body mass index is 30.18 kg/m. If this is out of the aforementioned range listed, please consider follow up with your Primary Care Provider. ________________________________________________________  The [facility] providers would like to encourage you to use [facility] to communicate with providers for non-urgent requests or questions.  Due to long hold times on the telephone, sending your provider a message by [facility] may be a faster and more efficient way to get a response.  Please allow 48 business hours for a response.  Please remember that this is for non-urgent requests.  _______________________________________________________  INCREASE: Miralax to once daily  Follow a high fiber diet  High-Fiber Eating Plan  Fiber, also called dietary fiber, is found in foods such as fruits, vegetables, whole grains, and beans.  A high-fiber diet can be good for your health. Your health care provider may recommend a high-fiber diet to help: Prevent trouble pooping (constipation). Lower your cholesterol. Treat the following conditions: Hemorrhoids. This is inflammation of veins in the anus. Inflammation of specific areas of the digestive tract. Irritable bowel syndrome (IBS). This is a problem of the large intestine, also called the colon, that sometimes causes belly pain and bloating. Prevent overeating as part of a weight-loss plan. Lower the risk of heart disease, type 2 diabetes, and certain cancers.  What are tips for following this plan?  Reading food labels  Check the nutrition facts label on foods for the amount of dietary fiber. Choose foods that have 4 grams of fiber or more  per serving. The recommended goals for how much fiber you should eat each day include: Males 59 years old or younger: 30-34 g. Males over 4 years old: 28-34 g. Females 77 years old or younger: 25-28 g. Females over 52 years old: 22-25 g. Your daily fiber goal is _____________ g. Shopping Choose whole fruits and vegetables instead of processed. For example, choose apples instead of apple juice or applesauce. Choose a variety of high-fiber foods such as avocados, lentils, oats, and pinto beans. Read the nutrition facts label on foods. Check for foods with added fiber. These foods often have high sugar and salt (sodium) amounts per serving. Cooking Use whole-grain flour for baking and cooking. Cook with brown rice instead of white rice. Make meals that have a lot of beans and vegetables in them, such as chili or vegetable-based soups. Meal planning Start the day with a breakfast that is high in fiber, such as a cereal that has 5 g of fiber or more per serving. Eat breads and cereals that are made with whole-grain flour instead of refined flour or white flour. Eat brown rice, bulgur wheat, or millet instead of white rice. Use beans in place of meat in soups, salads, and pasta dishes. Be sure that half of the grains you eat each day are whole grains. General information You can get the recommended amount of dietary fiber by: Eating a variety of fruits, vegetables, grains, nuts, and beans. Taking a fiber supplement if you aren't able to eat enough fiber. It's better to get fiber through food than from a supplement. Slowly increase how much fiber you eat. If you increase the amount of fiber you eat too quickly, you may have bloating, cramping, or gas. Drink plenty of water to  help you digest fiber. Choose high-fiber snacks, such as berries, raw vegetables, nuts, and popcorn.  What foods should I eat? Fruits Berries. Pears. Apples. Oranges. Avocado. Prunes and raisins. Dried  figs. Vegetables Sweet potatoes. Spinach. Kale. Artichokes. Cabbage. Broccoli. Cauliflower. Green peas. Carrots. Squash. Grains Whole-grain breads. Multigrain cereal. Oats and oatmeal. Brown rice. Barley. Bulgur wheat. Millet. Quinoa. Bran muffins. Popcorn. Rye wafer crackers. Meats and other proteins Navy beans, kidney beans, and pinto beans. Soybeans. Split peas. Lentils. Nuts and seeds. Dairy Fiber-fortified yogurt. Fortified means that fiber has been added to the product. Beverages Fiber-fortified soy milk. Fiber-fortified orange juice. Other foods Fiber bars. The items listed above may not be all the foods and drinks you can have. Talk to a dietitian to learn more. What foods should I avoid? Fruits Fruit juice. Cooked, strained fruit. Vegetables Fried potatoes. Canned vegetables. Well-cooked vegetables. Grains White bread. Pasta made with refined flour. White rice. Meats and other proteins Fatty meat. Fried chicken or fried fish. Dairy Milk. Cream cheese. Sour cream. Fats and oils Butters. Beverages Soft drinks. Other foods Cakes and pastries. The items listed above may not be all the foods and drinks you should avoid. Talk to a dietitian to learn more. This information is not intended to replace advice given to you by your health care provider. Make sure you discuss any questions you have with your health care provider. Document Revised: 10/29/2022 Document Reviewed: 10/29/2022 Elsevier Patient Education  2024 Elsevier Inc.  You have been scheduled for an endoscopy. Please follow written instructions given to you at your visit today.  If you use inhalers (even only as needed), please bring them with you on the day of your procedure.  If you take any of the following medications, they will need to be adjusted prior to your procedure:   DO NOT TAKE 7 DAYS PRIOR TO TEST- Trulicity (dulaglutide) Ozempic, Wegovy (semaglutide) Mounjaro (tirzepatide) Bydureon Bcise  (exanatide extended release)  DO NOT TAKE 1 DAY PRIOR TO YOUR TEST Rybelsus (semaglutide) Adlyxin (lixisenatide) Victoza (liraglutide) Byetta (exanatide) ___________________________________________________________________________  Due to recent changes in healthcare laws, you may see the results of your imaging and laboratory studies on MyChart before your provider has had a chance to review them.  We understand that in some cases there may be results that are confusing or concerning to you. Not all laboratory results come back in the same time frame and the provider may be waiting for multiple results in order to interpret others.  Please give [place] 48 hours in order for your provider to thoroughly review all the results before contacting the office for clarification of your results.   Thank you for entrusting me with your care and choosing [facility].  Willette Cluster, NP

## 2023-04-19 ENCOUNTER — Encounter: Payer: Self-pay | Admitting: Internal Medicine

## 2023-04-19 ENCOUNTER — Ambulatory Visit (AMBULATORY_SURGERY_CENTER): Payer: Medicare Other | Admitting: Internal Medicine

## 2023-04-19 VITALS — BP 153/77 | HR 79 | Temp 98.6°F | Resp 14 | Ht 62.0 in | Wt 165.0 lb

## 2023-04-19 DIAGNOSIS — K219 Gastro-esophageal reflux disease without esophagitis: Secondary | ICD-10-CM

## 2023-04-19 DIAGNOSIS — R1319 Other dysphagia: Secondary | ICD-10-CM

## 2023-04-19 DIAGNOSIS — K317 Polyp of stomach and duodenum: Secondary | ICD-10-CM | POA: Diagnosis not present

## 2023-04-19 DIAGNOSIS — K222 Esophageal obstruction: Secondary | ICD-10-CM | POA: Diagnosis not present

## 2023-04-19 MED ORDER — SODIUM CHLORIDE 0.9 % IV SOLN: 500.0000 mL | INTRAVENOUS | Status: DC

## 2023-04-19 NOTE — Progress Notes (Unsigned)
History and Physical Interval Note:  04/19/2023 3:48 PM  Joy [name]  has presented today for endoscopic procedure(s), with the diagnosis of  Encounter Diagnosis  Name Primary?   Gastroesophageal reflux disease, unspecified whether esophagitis present Yes  .  The various methods of evaluation and treatment have been discussed with the patient and/or family. After consideration of risks, benefits and other options for treatment, the patient has consented to  the endoscopic procedure(s).   The patient's history has been reviewed, patient examined, no change in status, stable for endoscopic procedure(s).  I have reviewed the patient's chart and labs.  Questions were answered to the patient's satisfaction.     Iva Boop, MD, [facility]

## 2023-04-19 NOTE — Progress Notes (Unsigned)
Uneventful anesthetic. Report to pacu rn. Vss. Care resumed by rn. 

## 2023-04-19 NOTE — Progress Notes (Signed)
Patient states there have been no changes to medical or surgical history since time of pre-visit. 

## 2023-04-19 NOTE — Patient Instructions (Addendum)
I dilated the esophagus again today - I saw a narrow area in the upper esophagus again.  I hope this solves the swallowing trouble. If not, let me know.  Also have some tiny benign stomach polyps and a hiatal hernia.  I appreciate the opportunity to care for you. Iva Boop, MD, [facility]  Recommendation:           - Patient has a contact number available for                            emergencies. The signs and symptoms of potential                            delayed complications were discussed with the                            patient. Return to normal activities tomorrow.                            Written discharge instructions were provided to the                            patient.                           - Clear liquids x 1 hour then soft foods rest of                            day. Start prior diet tomorrow.                           - Continue present medications. Repeat dilation prn  Handout on Dilatation Diet given.  YOU HAD AN ENDOSCOPIC PROCEDURE TODAY AT THE [facility]:   Refer to the procedure report that was given to you for any specific questions about what was found during the examination.  If the procedure report does not answer your questions, please call your gastroenterologist to clarify.  If you requested that your care partner not be given the details of your procedure findings, then the procedure report has been included in a sealed envelope for you to review at your convenience later.  YOU SHOULD EXPECT: Some feelings of bloating in the abdomen. Passage of more gas than usual.  Walking can help get rid of the air that was put into your GI tract during the procedure and reduce the bloating. If you had a lower endoscopy (such as a colonoscopy or flexible sigmoidoscopy) you may notice spotting of blood in your stool or on the toilet paper. If you underwent a bowel prep for your procedure, you may not have a normal bowel movement for a few  days.  Please Note:  You might notice some irritation and congestion in your nose or some drainage.  This is from the oxygen used during your procedure.  There is no need for concern and it should clear up in a day or so.  SYMPTOMS TO REPORT IMMEDIATELY:  Following upper endoscopy (EGD)  Vomiting of blood or coffee ground material  New chest pain or pain under the shoulder blades  Painful or persistently difficult swallowing  New shortness of breath  Fever of 100F or higher  Black, tarry-looking stools  For urgent or emergent issues, a gastroenterologist can be reached at any hour by calling [phone]. Do not use MyChart messaging for urgent concerns.    DIET:  We do recommend a small meal at first, but then you may proceed to your regular diet.  Drink plenty of fluids but you should avoid alcoholic beverages for 24 hours.  ACTIVITY:  You should plan to take it easy for the rest of today and you should NOT DRIVE or use heavy machinery until tomorrow (because of the sedation medicines used during the test).    FOLLOW UP: Our staff will call the number listed on your records the next business day following your procedure.  We will call around 7:15- 8:00 am to check on you and address any questions or concerns that you may have regarding the information given to you following your procedure. If we do not reach you, we will leave a message.     If any biopsies were taken you will be contacted by phone or by letter within the next 1-3 weeks.  Please call us at [phone] if you have not heard about the biopsies in 3 weeks.    SIGNATURES/CONFIDENTIALITY: You and/or your care partner have signed paperwork which will be entered into your electronic medical record.  These signatures attest to the fact that that the information above on your After Visit Summary has been reviewed and is understood.  Full responsibility of the confidentiality of this discharge information lies with you  and/or your care-partner.

## 2023-04-19 NOTE — Progress Notes (Signed)
Called to room to assist during endoscopic procedure.  Patient ID and intended procedure confirmed with present staff. Received instructions for my participation in the procedure from the performing physician.  

## 2023-04-19 NOTE — Op Note (Signed)
[facility] Patient Name: Joy [name] Procedure Date: 04/19/2023 3:45 PM MRN: [id number] Endoscopist: Iva Boop , MD, [id number] Age: 82 Referring MD:  Date of Birth: [birth year] Gender: Female Account #: [id number] Procedure:                Upper GI endoscopy Indications:              Dysphagia Medicines:                Monitored Anesthesia Care Procedure:                Pre-Anesthesia Assessment:                           - Prior to the procedure, a History and Physical                            was performed, and patient medications and                            allergies were reviewed. The patient's tolerance of                            previous anesthesia was also reviewed. The risks                            and benefits of the procedure and the sedation                            options and risks were discussed with the patient.                            All questions were answered, and informed consent                            was obtained. Prior Anticoagulants: The patient has                            taken no anticoagulant or antiplatelet agents. ASA                            Grade Assessment: III - A patient with severe                            systemic disease. After reviewing the risks and                            benefits, the patient was deemed in satisfactory                            condition to undergo the procedure.                           After obtaining informed consent, the endoscope was  passed under direct vision. Throughout the                            procedure, the patient's blood pressure, pulse, and                            oxygen saturations were monitored continuously. The                            Olympus scope [id number] was introduced through the                            mouth, and advanced to the second part of duodenum.                            The upper GI endoscopy was  accomplished without                            difficulty. The patient tolerated the procedure                            well. Scope In: Scope Out: Findings:                 One benign-appearing, intrinsic moderate                            (circumferential scarring or stenosis; an endoscope                            may pass) stenosis was found in the upper third of                            the esophagus. The stenosis was traversed. A TTS                            dilator was passed through the scope. Dilation with                            a 15-16.5-18 mm balloon dilator was performed to 15                            mm. The dilation site was examined and showed mild                            mucosal disruption. Estimated blood loss was                            minimal.                           An 8 cm hiatal hernia was present.                           The gastroesophageal flap valve  was visualized                            endoscopically and classified as Hill Grade IV (no                            fold, wide open lumen, hiatal hernia present).                           Multiple diminutive sessile polyps were found in                            the gastric fundus and in the gastric body.                           The exam was otherwise without abnormality. Complications:            No immediate complications. Estimated Blood Loss:     Estimated blood loss was minimal. Impression:               - Benign-appearing esophageal stenosis. Dilated.                            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                           - Gastroesophageal flap valve classified as Hill                            Grade IV (no fold, wide open lumen, hiatal hernia                            present).                           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                           - No specimens collected. Recommendation:           - Patient has a contact number available for                            emergencies. The signs and symptoms of potential  delayed complications were discussed with the                            patient. Return to normal activities tomorrow.                            Written discharge instructions were provided to the                            patient.                           - Clear liquids x 1 hour then soft foods rest of                            day. Start prior diet tomorrow.                           - Continue present medications. Repeat dilation prn Iva Boop, MD 04/19/2023 4:13:49 PM This report has been signed electronically.

## 2023-04-23 ENCOUNTER — Telehealth: Payer: Self-pay | Admitting: *Deleted

## 2023-04-23 NOTE — Telephone Encounter (Signed)
No answer on  follow up call. Left message.   

## 2023-04-24 DIAGNOSIS — M461 Sacroiliitis, not elsewhere classified: Secondary | ICD-10-CM | POA: Diagnosis not present

## 2023-05-07 DIAGNOSIS — Z1589 Genetic susceptibility to other disease: Secondary | ICD-10-CM | POA: Diagnosis not present

## 2023-05-07 DIAGNOSIS — E669 Obesity, unspecified: Secondary | ICD-10-CM | POA: Diagnosis not present

## 2023-05-07 DIAGNOSIS — M5432 Sciatica, left side: Secondary | ICD-10-CM | POA: Diagnosis not present

## 2023-05-07 DIAGNOSIS — R42 Dizziness and giddiness: Secondary | ICD-10-CM | POA: Diagnosis not present

## 2023-05-07 DIAGNOSIS — D649 Anemia, unspecified: Secondary | ICD-10-CM | POA: Diagnosis not present

## 2023-05-07 DIAGNOSIS — Z6830 Body mass index (BMI) 30.0-30.9, adult: Secondary | ICD-10-CM | POA: Diagnosis not present

## 2023-05-07 DIAGNOSIS — M0579 Rheumatoid arthritis with rheumatoid factor of multiple sites without organ or systems involvement: Secondary | ICD-10-CM | POA: Diagnosis not present

## 2023-05-07 DIAGNOSIS — Z79899 Other long term (current) drug therapy: Secondary | ICD-10-CM | POA: Diagnosis not present

## 2023-05-07 DIAGNOSIS — M1991 Primary osteoarthritis, unspecified site: Secondary | ICD-10-CM | POA: Diagnosis not present

## 2023-05-13 ENCOUNTER — Ambulatory Visit (HOSPITAL_BASED_OUTPATIENT_CLINIC_OR_DEPARTMENT_OTHER): Payer: Medicare Other | Admitting: Cardiovascular Disease

## 2023-05-13 ENCOUNTER — Encounter (HOSPITAL_BASED_OUTPATIENT_CLINIC_OR_DEPARTMENT_OTHER): Payer: Self-pay | Admitting: *Deleted

## 2023-05-13 ENCOUNTER — Encounter (HOSPITAL_BASED_OUTPATIENT_CLINIC_OR_DEPARTMENT_OTHER): Payer: Self-pay | Admitting: Cardiovascular Disease

## 2023-05-13 VITALS — BP 120/70 | HR 74 | Ht 62.0 in | Wt 160.0 lb

## 2023-05-13 DIAGNOSIS — Z6836 Body mass index (BMI) 36.0-36.9, adult: Secondary | ICD-10-CM

## 2023-05-13 DIAGNOSIS — R7303 Prediabetes: Secondary | ICD-10-CM

## 2023-05-13 DIAGNOSIS — I10 Essential (primary) hypertension: Secondary | ICD-10-CM

## 2023-05-13 DIAGNOSIS — E7800 Pure hypercholesterolemia, unspecified: Secondary | ICD-10-CM

## 2023-05-13 LAB — COMPREHENSIVE METABOLIC PANEL
ALT: 9 IU/L (ref 0–32)
AST: 12 IU/L (ref 0–40)
Albumin: 3.5 g/dL — ABNORMAL LOW (ref 3.7–4.7)
Alkaline Phosphatase: 106 IU/L (ref 44–121)
BUN/Creatinine Ratio: 22 (ref 12–28)
BUN: 16 mg/dL (ref 8–27)
Bilirubin Total: 0.3 mg/dL (ref 0.0–1.2)
CO2: 22 mmol/L (ref 20–29)
Calcium: 9.9 mg/dL (ref 8.7–10.3)
Chloride: 102 mmol/L (ref 96–106)
Creatinine, Ser: 0.72 mg/dL (ref 0.57–1.00)
Globulin, Total: 3.5 g/dL (ref 1.5–4.5)
Glucose: 83 mg/dL (ref 70–99)
Potassium: 4.4 mmol/L (ref 3.5–5.2)
Sodium: 138 mmol/L (ref 134–144)
Total Protein: 7 g/dL (ref 6.0–8.5)
eGFR: 82 mL/min/{1.73_m2} (ref >59)

## 2023-05-13 LAB — LIPID PANEL
Chol/HDL Ratio: 2.9 ratio (ref 0.0–4.4)
Cholesterol, Total: 156 mg/dL (ref 100–199)
HDL: 54 mg/dL (ref >39)
LDL Chol Calc (NIH): 88 mg/dL (ref 0–99)
Triglycerides: 71 mg/dL (ref 0–149)
VLDL Cholesterol Cal: 14 mg/dL (ref 5–40)

## 2023-05-13 NOTE — Patient Instructions (Addendum)
Medication Instructions:  Your physician recommends that you continue on your current medications as directed. Please refer to the Current Medication list given to you today.  *If you need a refill on your cardiac medications before your next appointment, please call your pharmacy*   Lab Work: LP/CMET TODAY   If you have labs (blood work) drawn today and your tests are completely normal, you will receive your results only by: MyChart Message (if you have MyChart) OR A paper copy in the mail If you have any lab test that is abnormal or we need to change your treatment, we will call you to review the results.   Testing/Procedures: NONE   Follow-Up: At [facility], you and your health needs are our priority.  As part of our continuing mission to provide you with exceptional heart care, we have created designated Provider Care Teams.  These Care Teams include your primary Cardiologist (physician) and Advanced Practice Providers (APPs -  Physician Assistants and Nurse Practitioners) who all work together to provide you with the care you need, when you need it.  We recommend signing up for the patient portal called "MyChart".  Sign up information is provided on this After Visit Summary.  MyChart is used to connect with patients for Virtual Visits (Telemedicine).  Patients are able to view lab/test results, encounter notes, upcoming appointments, etc.  Non-urgent messages can be sent to your provider as well.   To learn more about what you can do with MyChart, go to [email or web address].    Your next appointment:   12 month(s)  Provider:   Chilton Si, MD or Gillian Shields, NP    Other Instructions YOU HAVE BEEN CLEARED FOR UPCOMING SURGERY WITH DR Lequita Halt

## 2023-05-13 NOTE — Progress Notes (Signed)
Cardiology Office Note:  .   Date:  05/13/2023  ID:  RENIAH [name], DOB Oct 03, 1940, MRN [id number] PCP: Jarrett Soho, PA-C  [facility] Providers Cardiologist:  Chilton Si, MD    History of Present Illness: .   Joy [name] is a 82 y.o. female with hypertension, hyperlipidemia, pre-diabetes, and COPD here for follow up.  She was first seen 11/2015 for an evaluation of pre-surgical cardiovascular risk prior to knee surgery.  [name] has a strong family history of cardiac disease and was noted to have EKG changes.  [name] fell and fractured her L tibial plateau on 09/02/15. In pre-op clearance appointment she had an EKG that revealed nonspecific ST depression. She was asymptomatic but not very active so she was referred for nuclear stress testing 11/2015 that revealed LVEF 54% and no ischemia.   She reported palpitations and wore a 24 hour Holter that showed occasional PACs and PVCs.   She saw Dr. Jens Som for an urgent visit 02/2021 for pre-syncope. She had been seen in the ED and her episode was thought to be orthostatic. Dr. Jens Som was also concerned about a vasovagal component. She had an Echo 03/2021 with LVEF 60-65% and normal diastolic function. She had aortic sclerosis without stenosis.  At her appointment 05/2021 she was doing well.  Blood pressure was elevated in the office but she had not yet taken her medication.  She had muscle aches that were thought to be exacerbated by her statin.  She followed up with Gillian Shields, NP 10/2022 and her blood pressures at home are well-controlled.  Lipids were uncontrolled and rosuvastatin was increased.  [name] presents with worsening leg pain due to L sided sciatica and R knee pain.  She sees Dr. Despina Hick and reports needing to have the knee replaced again.  She is scheduled for knee surgery in December.  In addition to the leg pain, [name] has been experiencing difficulty swallowing. An endoscopy was performed a  couple of weeks ago and the patient reports that the symptoms improved temporarily but have since returned. The patient has been breaking larger pills in half to swallow them and reports a fear of choking.  She also reports a change in appetite, leading to a weight loss of about 25 pounds. She attributes this change to a loss of interest in foods she previously enjoyed and a fear of choking. The patient denies any mood changes associated with this change in appetite.  The patient also mentions family stressors, including the birth of multiple new babies in the family and the recent death of a nephew.   [name] is no longer going to the [facility] for water aerobics or chair yoga.  She has no exertional dyspnea or CP.  She walks up and down stairs in her home several times each day without symptoms.  She also goes to [employer] and [employer] to walk for exercise and has no cardiac symptoms.  She denies LE edema, orthopnea or PND.      ROS:  As per HPI  Studies Reviewed: .       Echo 03/2021: 1. Left ventricular ejection fraction, by estimation, is 60 to 65%. The  left ventricle has normal function. The left ventricle has no regional  wall motion abnormalities. There is mild left ventricular hypertrophy.  Left ventricular diastolic parameters  were normal.   2. Right ventricular systolic function is normal. The right ventricular  size is normal.   3. Left atrial size  was mildly dilated.   4. The mitral valve is abnormal. Trivial mitral valve regurgitation. No  evidence of mitral stenosis.   5. The aortic valve is tricuspid. There is moderate calcification of the  aortic valve. Aortic valve regurgitation is not visualized. Mild to  moderate aortic valve sclerosis/calcification is present, without any  evidence of aortic stenosis.   6. The inferior vena cava is normal in size with greater than 50%  respiratory variability, suggesting right atrial pressure of 3 mmHg.   Risk Assessment/Calculations:              Physical Exam:   VS:  BP 120/70 (BP Location: Left Arm, Patient Position: Sitting, Cuff Size: Normal)   Pulse 74   Ht 5\' 2"  (1.575 m)   Wt 160 lb (72.6 kg)   BMI 29.26 kg/m  , BMI Body mass index is 29.26 kg/m. GENERAL:  Well appearing HEENT: Pupils equal round and reactive, fundi not visualized, oral mucosa unremarkable NECK:  No jugular venous distention, waveform within normal limits, carotid upstroke brisk and symmetric, no bruits, no thyromegaly  LUNGS:  Clear to auscultation bilaterally HEART:  RRR.  PMI not displaced or sustained,S1 and S2 within normal limits, no S3, no S4, no clicks, no rubs, no murmurs ABD:  Flat, positive bowel sounds normal in frequency in pitch, no bruits, no rebound, no guarding, no midline pulsatile mass, no hepatomegaly, no splenomegaly EXT:  1 plus pulses throughout, no edema, no cyanosis no clubbing SKIN:  No rashes no nodules NEURO:  Cranial nerves II through XII grossly intact, motor grossly intact throughout PSYCH:  Cognitively intact, oriented to person place and time  ASSESSMENT AND PLAN: .    # Hypertension and Hyperlipidemia Well-controlled on current medications (Amlodipine, Irbesartan, Rosuvastatin). -Continue current medications. -Recheck cholesterol levels today.   # Dysphagia Recent endoscopy with dilation due to difficulty swallowing. Symptoms improved temporarily but are now returning. Patient reports fear of choking and has altered eating habits. -Consider repeat endoscopy if symptoms persist.  # Knee Pain History of bilateral knee replacements. New onset of pain in one knee. Orthopedic surgeon has recommended another knee replacement, scheduled for December 23rd. -Encourage patient to maintain mobility and strength prior to surgery. -Communicate with orthopedic surgeon to confirm patient is medically cleared for surgery.  She can achieve 4 METS of activity and requires no additional cardiac testing.  #  Sciatica Chronic pain in leg following back surgery. Pain is not affecting sleep but begins upon waking and moving. -Consider referral to neurologist or neurosurgeon for further evaluation and management.  # Anemia Recent labs show mild anemia and low-normal iron levels. Patient reports change in appetite and weight loss. -Encourage balanced diet rich in iron. -Recheck labs as needed to monitor anemia.  # General Health Maintenance -Encourage return to physical activities such as chair yoga and water aerobics to maintain joint health and overall mobility. -Follow-up in 1 year, or sooner if new issues arise.       Dispo: f/u 1 year  Signed, Chilton Si, MD

## 2023-05-14 ENCOUNTER — Ambulatory Visit: Payer: Medicare Other | Admitting: Gastroenterology

## 2023-05-15 ENCOUNTER — Encounter (HOSPITAL_BASED_OUTPATIENT_CLINIC_OR_DEPARTMENT_OTHER): Payer: Self-pay | Admitting: Pulmonary Disease

## 2023-05-15 ENCOUNTER — Ambulatory Visit (HOSPITAL_BASED_OUTPATIENT_CLINIC_OR_DEPARTMENT_OTHER): Payer: Medicare Other | Admitting: Pulmonary Disease

## 2023-05-15 ENCOUNTER — Other Ambulatory Visit (HOSPITAL_COMMUNITY): Payer: Self-pay

## 2023-05-15 ENCOUNTER — Other Ambulatory Visit (HOSPITAL_COMMUNITY)
Admission: RE | Admit: 2023-05-15 | Discharge: 2023-05-15 | Disposition: A | Discharge status: Home / Self Care | Payer: Medicare Other | Attending: Pulmonary Disease | Admitting: Pulmonary Disease

## 2023-05-15 ENCOUNTER — Other Ambulatory Visit (HOSPITAL_BASED_OUTPATIENT_CLINIC_OR_DEPARTMENT_OTHER): Payer: Self-pay

## 2023-05-15 VITALS — BP 112/62 | HR 85 | Resp 16 | Ht 62.0 in | Wt 157.5 lb

## 2023-05-15 DIAGNOSIS — Z8701 Personal history of pneumonia (recurrent): Secondary | ICD-10-CM | POA: Insufficient documentation

## 2023-05-15 DIAGNOSIS — J209 Acute bronchitis, unspecified: Secondary | ICD-10-CM

## 2023-05-15 DIAGNOSIS — R053 Chronic cough: Secondary | ICD-10-CM | POA: Diagnosis not present

## 2023-05-15 MED ORDER — PREDNISONE 10 MG PO TABS: ORAL_TABLET | ORAL | 0 refills | Status: AC

## 2023-05-15 MED ORDER — STIOLTO RESPIMAT 2.5-2.5 MCG/ACT IN AERS: 2.0000 | INHALATION_SPRAY | Freq: Every day | RESPIRATORY_TRACT | 5 refills | Status: DC

## 2023-05-15 MED ORDER — STIOLTO RESPIMAT 2.5-2.5 MCG/ACT IN AERS: 2.0000 | INHALATION_SPRAY | Freq: Every day | RESPIRATORY_TRACT | Status: AC

## 2023-05-15 NOTE — Patient Instructions (Signed)
Pseudomonas colonization - s/p tobramycin. No persistent active symptoms Immunosuppressed patient --Completed tobramycin in 2023 --ORDER sputum culture and AFB --If persistent PA, may need to refer to ID again for extended tobra  Acute on chronic bronchitis - intermittent sputum production --Prednisone taper --START Stiolto TWO puffs in the morning. Will need to price check --CONTINUE Albuterol every 4 hours as needed for shortness of breath or wheezing --Continue Protonix 40 mg daily in setting of known hiatal hernia

## 2023-05-15 NOTE — Progress Notes (Signed)
Synopsis: Referred in 05/2018 for hx of chronic bronchitis x 2 years.   Subjective:   PATIENT ID: Joy [name] GENDER: female DOB: 07-20-1941, MRN: [id number]   HPI  Chief Complaint  Patient presents with   Follow-up    Breathing has been ok, cough has been terrible. Never ending. She uses inhaler and OTC cough med some help. 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 Started on Spiriva 2020 - Improved symptoms on Spiriva however discontinued due to cost.  2022 - Restarted on Spiriva. August - Augmentin. September - steroids. November +pseudomonas treated x 3 weeks with persistent symptoms 2023 -Treated for PA PNA in March. Treated with inhaled tobra for PA colonization  10/12/22 Since our last visit she has increased sputum production and increased rhinorrhea. Symptoms are manageable. Has run out of Breo for a few months due to cost. Has some leftover Spiriva.  12/20/22 She reports intermittent copious sputum production that occurs once a month at night. Denies fevers, chills. Breathing is better overall. Not currently on maintenance inhalers. Has some leftover Spiriva. No longer needing cough syrup  05/15/23 Patient contacted office on 8/15 for productive cough x 2 weeks. Rx prednisone 40 mg, Augmentin, cough syrup with codeine. She reports that cough is productive and persistent. Will cough until she throws up. Denies fevers or chills. She rarely uses the albuterol even though she has respiratory symptoms. She feels her intermittent dysphagia and GERD are contributing. [facility] due to multiple friends/friend's husbands death.   Social History: Quit smoking in 1995. Smoked for 30 years x 1ppd.   Past Medical History:  Diagnosis Date   Ankylosing spondylitis (HCC)     Anxiety    Arthritis    RHEUMATOID   Back pain    Bronchitis    Constipation    COPD (chronic obstructive pulmonary disease) (HCC)    CXR 01/11/18 showed mild COPD and chronic bronchitis   CTS (carpal tunnel syndrome)    Dry eye    Dry mouth    Dysrhythmia    "irregularity" unknown at this time - being evaluated by cardiology   Essential hypertension 11/24/2015   GERD (gastroesophageal reflux disease)    HLA B27 (HLA B27 positive)    Hypercholesteremia    Hyperlipidemia 11/24/2015   Hypertension    IBS (irritable bowel syndrome)    Joint pain    Neuropathy    OAB (overactive bladder)    Obesity    Osteoarthritis    Osteoporosis    Pre-diabetes    Rheumatoid arthritis (HCC) 11/24/2015   Sciatica    Seasonal allergies    Spinal stenosis     Allergies  Allergen Reactions   Codeine Other (See Comments)    Lump in throat   Tape Hives and Rash    Paper tape only   Latex Rash   Nickel Rash    Bumps Also other metals     Outpatient Medications Prior to Visit  Medication Sig Dispense Refill   albuterol (VENTOLIN HFA) 108 (90 Base) MCG/ACT inhaler Inhale 2 puffs into the lungs every 4 (four) hours as needed for wheezing or shortness of breath. 8 g 6   amLODipine (NORVASC) 2.5 MG tablet TAKE 1 TABLET(2.5 MG) BY MOUTH DAILY 90 tablet 2   Apoaequorin (PREVAGEN  PO) Take by mouth.     Cholecalciferol (VITAMIN D) 50 MCG (2000 UT) tablet Take 2,000 Units by mouth daily.     diphenhydrAMINE (BENADRYL) 25 MG tablet Take 25 mg by mouth daily as needed for allergies.     gabapentin (NEURONTIN) 300 MG capsule Take 300-600 mg by mouth See admin instructions. Take 300 mg in the morning, 300 mg in the afternoon, and 600 mg at night  1   inFLIXimab (REMICADE IV) Inject 100 mg into the vein every 6 (six) weeks.     irbesartan (AVAPRO) 300 MG tablet Take 1 tablet (300 mg total) by mouth daily. 30 tablet 0   Magnesium 250 MG TABS Take 1 tablet (250 mg total) by mouth daily. 30 tablet 0   Menthol,  Topical Analgesic, (ICY HOT EX) Apply 1 Application topically daily as needed (pain).     naproxen sodium (ALEVE) 220 MG tablet Take 440 mg by mouth daily as needed (pain).     pantoprazole (PROTONIX) 40 MG tablet Take 40 mg by mouth daily.     polyethylene glycol (MIRALAX / GLYCOLAX) 17 g packet Take 17 g by mouth daily as needed for moderate constipation.     Propylene Glycol (SYSTANE BALANCE) 0.6 % SOLN Place 1 drop into both eyes 2 (two) times daily as needed (dry eyes).     rosuvastatin (CRESTOR) 40 MG tablet Take 1 tablet (40 mg total) by mouth daily. NEED APPOINTMENT 90 tablet 3   sertraline (ZOLOFT) 25 MG tablet Take 50 mg by mouth daily.      tobramycin, PF, (TOBI) 300 MG/5ML nebulizer solution Take 5 mLs (300 mg total) by nebulization 2 (two) times daily. 280 mL 0   Varenicline Tartrate (TYRVAYA) 0.03 MG/ACT SOLN Place 1 spray into the nose daily.     vitamin B-12 (CYANOCOBALAMIN) 100 MCG tablet Take 100 mcg by mouth daily.     fluticasone-salmeterol (WIXELA INHUB) 250-50 MCG/ACT AEPB Inhale 1 puff into the lungs in the morning and at bedtime. 60 each 5   tiotropium (SPIRIVA) 18 MCG inhalation capsule Place 18 mcg into inhaler and inhale daily.     No facility-administered medications prior to visit.    Review of Systems  Constitutional:  Negative for chills, diaphoresis, fever, malaise/fatigue and weight loss.  HENT:  Negative for congestion.   Respiratory:  Positive for cough, sputum production and shortness of breath. Negative for hemoptysis and wheezing.   Cardiovascular:  Negative for chest pain, palpitations and leg swelling.  Gastrointestinal:  Positive for vomiting.    Objective:   Vitals:   05/15/23 1536  BP: 112/62  Pulse: 85  Resp: 16  SpO2: 98%  Weight: 157 lb 8 oz (71.4 kg)  Height: 5\' 2"  (1.575 m)    Physical Exam: General: Well-appearing, no acute distress HENT: [place], AT Eyes: EOMI, no scleral icterus Respiratory: Clear to auscultation bilaterally.  No  crackles, wheezing or rales Cardiovascular: RRR, -M/R/G, no JVD Extremities:-Edema,-tenderness Neuro: AAO x4, CNII-XII grossly intact Psych: Normal mood, normal affect  Chest imaging: CXR 01/11/18 - No pulmonary edema, effusion or infiltrate CT HR 07/28/21 - RLL nodule 1.4 x 1 and RUL ground glas nodule. Otherwise normal parenchyma. Moderate/large hiatal hernia. 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 Unchanged coarse interstitial markings suggestive of chronic bronchitis CT Chest 12/21/21 - Right lower lobe nodule decreased from 14mm to 9 mm. Stable GGO. CT Chest 06/07/22 - Resolved RLL with cyst. Stable nodules including RUL  and LUL Interval development of LUL nodules with largest 6mm. CT Chest 12/18/22 - Stable RLL lung nodule. Resolved nodules mentioned before  PFT:  08/21/18  FVC 2.4 (154%) FEV1 2.21 (156%) Ratio 74 TLC 104% DLCO corrected 78% Interpretation: Normal spirometry and lung volumes with mildly reduced DLCO  Mild obstructive defect present with mildly reduced DLCO. TLC normal. No significant bronchodilator effect present however does not preclude benefit of bronchodilator therapy.  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 Symptomatic. Not on maintenance inhalers. Re-counseled patient on inhaler compliance and utilization. Patient does not have insight on medical management of her lung disease and does not correlate symptoms with lack of meds. Will run out of samples and not contact office for prolonged periods of time. Currently feels like she is having symptoms but not suffering despite how it has affected her ability to engage with regular  activity  Pseudomonas colonization - s/p tobramycin. No persistent active symptoms Immunosuppressed patient --Completed tobramycin in 2023 --ORDER sputum culture and AFB --If persistent PA, may need to refer to ID again for extended tobra treatment  Acute on chronic bronchitis - intermittent sputum production --Prednisone taper --START Stiolto TWO puffs in the morning. Will need to price check --CONTINUE Albuterol every 4 hours as needed for shortness of breath or wheezing --Continue Protonix 40 mg daily in setting of known hiatal hernia  RLL lung nodularity- stable 9mm Interval development of RUL 5mm ground glass nodule - likely benign History of waxing and waning nodules. Suspect all benign in currently asymptomatic patient After discussion regarding risks and benefits, would favor to stop surveillance as we would likely not pursue intervention unless her symptoms worsened. No further chest imaging indicated  Immunization History  Administered Date(s) Administered   Fluad Quad(high Dose 65+) 04/21/2019, 05/20/2021   Influenza Split 05/22/2012, 04/16/2014   Influenza, High Dose Seasonal PF 05/20/2018, 05/24/2020   Influenza-Unspecified 04/26/2015, 06/16/2019   PFIZER(Purple Top)SARS-COV-2 Vaccination 09/11/2019, 09/25/2019, 05/14/2020, 11/25/2020   Pfizer Covid-19 Vaccine Bivalent Booster 61yrs & up 05/08/2021   Pneumococcal Conjugate-13 04/16/2014   Pneumococcal Polysaccharide-23 04/26/2015   Tdap 10/19/2014   Meds ordered this encounter  Medications   Tiotropium Bromide-Olodaterol (STIOLTO RESPIMAT) 2.5-2.5 MCG/ACT AERS    Sig: Inhale 2 puffs into the lungs daily.    Dispense:  4 g    Refill:  5   predniSONE (DELTASONE) 10 MG tablet    Sig: Take 4 tablets (40 mg total) by mouth daily with breakfast for 2 days, THEN 3 tablets (30 mg total) daily with breakfast for 2 days, THEN 2 tablets (20 mg total) daily with breakfast for 2 days, THEN 1 tablet (10 mg total) daily with  breakfast for 2 days.    Dispense:  20 tablet    Refill:  0   Tiotropium Bromide-Olodaterol (STIOLTO RESPIMAT) 2.5-2.5 MCG/ACT AERS    Sig: Inhale 2 puffs into the lungs daily.    Order Specific Question:   Lot Number?    Answer:   [id number]    Order Specific Question:   Expiration Date?    Answer:   01/17/2025    Order Specific Question:   NDC    Answer:   4401-0272-53 [[id number]]    Order Specific Question:   Quantity    Answer:   2  Orders Placed This Encounter  Procedures   AFB Culture & Smear    Standing Status:   Future    Standing Expiration Date:   05/14/2024   Culture, Respiratory w Gram Stain   Return for December.  I have spent a total time of 35-minutes on the day of the appointment including chart review, data review, collecting history, coordinating care and discussing medical diagnosis and plan with the patient/family. Past medical history, allergies, medications were reviewed. Pertinent imaging, labs and tests included in this note have been reviewed and interpreted independently by me.  Yolandra Habig Mechele Collin, MD [facility] 05/15/2023

## 2023-05-16 LAB — CULTURE, RESPIRATORY W GRAM STAIN

## 2023-05-17 ENCOUNTER — Encounter (HOSPITAL_BASED_OUTPATIENT_CLINIC_OR_DEPARTMENT_OTHER): Payer: Self-pay | Admitting: Pulmonary Disease

## 2023-05-17 MED ORDER — CIPROFLOXACIN HCL 500 MG PO TABS: 500.0000 mg | ORAL_TABLET | Freq: Two times a day (BID) | ORAL | 0 refills | Status: AC

## 2023-05-17 NOTE — Addendum Note (Signed)
Addended by: Luciano Cutter on: 05/17/2023 05:16 PM   Modules accepted: Orders

## 2023-05-20 ENCOUNTER — Encounter (HOSPITAL_BASED_OUTPATIENT_CLINIC_OR_DEPARTMENT_OTHER): Payer: Self-pay | Admitting: Pulmonary Disease

## 2023-05-20 LAB — CULTURE, RESPIRATORY W GRAM STAIN

## 2023-05-24 ENCOUNTER — Telehealth (HOSPITAL_BASED_OUTPATIENT_CLINIC_OR_DEPARTMENT_OTHER): Payer: Self-pay

## 2023-05-24 NOTE — Telephone Encounter (Addendum)
Left message for patient to call back    ----- Message from Alver Sorrow sent at 05/23/2023  7:45 PM EDT ----- Normal kidneys, liver, electrolytes.  Cholesterol improved from previous with LDL 88 (previously 92) but not yet at goal of <70.   Ensure taking Rosuvastatin 40mg  daily. If taking regularly, add Zetia 10mg  daily with repeat FLP/LFT in 3 months.   If not taking regularly, resume Rosuvastatin 40mg  daily with FLP/LFT in 3 months.

## 2023-05-27 DIAGNOSIS — Z01818 Encounter for other preprocedural examination: Secondary | ICD-10-CM | POA: Diagnosis not present

## 2023-05-27 DIAGNOSIS — L219 Seborrheic dermatitis, unspecified: Secondary | ICD-10-CM | POA: Diagnosis not present

## 2023-05-27 DIAGNOSIS — R7303 Prediabetes: Secondary | ICD-10-CM | POA: Diagnosis not present

## 2023-05-27 DIAGNOSIS — Z23 Encounter for immunization: Secondary | ICD-10-CM | POA: Diagnosis not present

## 2023-05-27 DIAGNOSIS — R630 Anorexia: Secondary | ICD-10-CM | POA: Diagnosis not present

## 2023-05-27 DIAGNOSIS — Z6829 Body mass index (BMI) 29.0-29.9, adult: Secondary | ICD-10-CM | POA: Diagnosis not present

## 2023-05-27 DIAGNOSIS — M1712 Unilateral primary osteoarthritis, left knee: Secondary | ICD-10-CM | POA: Diagnosis not present

## 2023-05-27 DIAGNOSIS — R82998 Other abnormal findings in urine: Secondary | ICD-10-CM | POA: Diagnosis not present

## 2023-05-27 NOTE — Telephone Encounter (Signed)
Seen by patient Joy [name] on 05/26/2023  6:22 AM, FOLLOW UP MYCHART MESSAGE SENT TO PATIENT

## 2023-06-04 DIAGNOSIS — M0589 Other rheumatoid arthritis with rheumatoid factor of multiple sites: Secondary | ICD-10-CM | POA: Diagnosis not present

## 2023-06-07 DIAGNOSIS — Z23 Encounter for immunization: Secondary | ICD-10-CM | POA: Diagnosis not present

## 2023-06-12 DIAGNOSIS — M4726 Other spondylosis with radiculopathy, lumbar region: Secondary | ICD-10-CM | POA: Diagnosis not present

## 2023-06-12 DIAGNOSIS — M960 Pseudarthrosis after fusion or arthrodesis: Secondary | ICD-10-CM | POA: Diagnosis not present

## 2023-06-12 DIAGNOSIS — Z6829 Body mass index (BMI) 29.0-29.9, adult: Secondary | ICD-10-CM | POA: Diagnosis not present

## 2023-06-19 DIAGNOSIS — M4726 Other spondylosis with radiculopathy, lumbar region: Secondary | ICD-10-CM | POA: Diagnosis not present

## 2023-07-11 DIAGNOSIS — H35373 Puckering of macula, bilateral: Secondary | ICD-10-CM | POA: Diagnosis not present

## 2023-07-11 DIAGNOSIS — H16223 Keratoconjunctivitis sicca, not specified as Sjogren's, bilateral: Secondary | ICD-10-CM | POA: Diagnosis not present

## 2023-07-11 DIAGNOSIS — H43813 Vitreous degeneration, bilateral: Secondary | ICD-10-CM | POA: Diagnosis not present

## 2023-07-11 DIAGNOSIS — H5713 Ocular pain, bilateral: Secondary | ICD-10-CM | POA: Diagnosis not present

## 2023-07-16 DIAGNOSIS — Z79899 Other long term (current) drug therapy: Secondary | ICD-10-CM | POA: Diagnosis not present

## 2023-07-16 DIAGNOSIS — M0589 Other rheumatoid arthritis with rheumatoid factor of multiple sites: Secondary | ICD-10-CM | POA: Diagnosis not present

## 2023-07-17 DIAGNOSIS — M960 Pseudarthrosis after fusion or arthrodesis: Secondary | ICD-10-CM | POA: Diagnosis not present

## 2023-07-17 DIAGNOSIS — M461 Sacroiliitis, not elsewhere classified: Secondary | ICD-10-CM | POA: Diagnosis not present

## 2023-07-17 DIAGNOSIS — M4726 Other spondylosis with radiculopathy, lumbar region: Secondary | ICD-10-CM | POA: Diagnosis not present

## 2023-07-31 ENCOUNTER — Encounter (HOSPITAL_BASED_OUTPATIENT_CLINIC_OR_DEPARTMENT_OTHER): Payer: Self-pay | Admitting: Pulmonary Disease

## 2023-07-31 ENCOUNTER — Ambulatory Visit (HOSPITAL_BASED_OUTPATIENT_CLINIC_OR_DEPARTMENT_OTHER): Payer: Medicare Other | Admitting: Pulmonary Disease

## 2023-07-31 VITALS — BP 118/72 | HR 88 | Resp 16 | Ht 62.0 in | Wt 155.7 lb

## 2023-07-31 DIAGNOSIS — R918 Other nonspecific abnormal finding of lung field: Secondary | ICD-10-CM | POA: Diagnosis not present

## 2023-07-31 DIAGNOSIS — J42 Unspecified chronic bronchitis: Secondary | ICD-10-CM

## 2023-07-31 MED ORDER — STIOLTO RESPIMAT 2.5-2.5 MCG/ACT IN AERS: 2.0000 | INHALATION_SPRAY | Freq: Every day | RESPIRATORY_TRACT | 11 refills | Status: DC

## 2023-07-31 NOTE — Patient Instructions (Signed)
Pseudomonas colonization - s/p tobramycin. No persistent active symptoms Immunosuppressed patient --Completed tobramycin in 2023 --If persistent PA, may need to refer to ID again for extended tobra treatment  Chronic bronchitis - improved --CONTINUE Stiolto TWO puffs in the morning. REFILLED --CONTINUE Albuterol every 4 hours as needed for shortness of breath or wheezing --Continue Protonix 40 mg daily in setting of known hiatal hernia

## 2023-07-31 NOTE — Progress Notes (Signed)
Synopsis: Referred in 05/2018 for hx of chronic bronchitis x 2 years.   Subjective:   PATIENT ID: Joy [name] GENDER: female DOB: 1940/12/26, MRN: [id number]   HPI  Chief Complaint  Patient presents with   Follow-up    Patient lost 40lbs  over the past 6 months. She has throat issues but it worries that it happened so fast.    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 Started on Spiriva 2020 - Improved symptoms on Spiriva however discontinued due to cost.  2022 - Restarted on Spiriva. August - Augmentin. September - steroids. November +pseudomonas treated x 3 weeks with persistent symptoms 2023 -Treated for PA PNA in March. Treated with inhaled tobra for PA colonization 2024 - Chronic bronchitis. Lars Mage but stopped due to cost. Exacerbation in August. GERD issues and recent EGD dilation in Aug.  07/31/23 Since our last visit she reports poor appetitie and weight loss. Had stenosis on EGD and was dilated in 03/2023. She reports cough has been fair with minimal sputum but worsening nasal congestion. No limitation in activity. Still going to church and social activities.  Social History: Quit smoking in 1995. Smoked for 30 years x 1ppd.   Past Medical History:  Diagnosis Date   Ankylosing spondylitis (HCC)    Anxiety    Arthritis    RHEUMATOID   Back pain    Bronchitis    Constipation    COPD (chronic obstructive pulmonary disease) (HCC)    CXR 01/11/18 showed mild COPD and chronic bronchitis   CTS (carpal tunnel syndrome)    Dry eye    Dry mouth    Dysrhythmia    "irregularity" unknown at this time - being evaluated by cardiology   Essential hypertension 11/24/2015   GERD (gastroesophageal reflux disease)    HLA B27 (HLA B27 positive)    Hypercholesteremia    Hyperlipidemia 11/24/2015    Hypertension    IBS (irritable bowel syndrome)    Joint pain    Neuropathy    OAB (overactive bladder)    Obesity    Osteoarthritis    Osteoporosis    Pre-diabetes    Rheumatoid arthritis (HCC) 11/24/2015   Sciatica    Seasonal allergies    Spinal stenosis     Allergies  Allergen Reactions   Codeine Other (See Comments)    Lump in throat   Tape Hives and Rash    Paper tape only   Latex Rash   Nickel Rash    Bumps Also other metals     Outpatient Medications Prior to Visit  Medication Sig Dispense Refill   albuterol (VENTOLIN HFA) 108 (90 Base) MCG/ACT inhaler Inhale 2 puffs into the lungs every 4 (four) hours as needed for wheezing or shortness of breath. 8 g 6   amLODipine (NORVASC) 2.5 MG tablet TAKE 1 TABLET(2.5 MG) BY MOUTH DAILY 90 tablet 2   Apoaequorin (PREVAGEN PO) Take by mouth.     Cholecalciferol (VITAMIN D) 50 MCG (2000 UT) tablet Take 2,000 Units by mouth daily.     diphenhydrAMINE (BENADRYL) 25 MG tablet Take 25 mg by mouth daily as needed for allergies.     gabapentin (NEURONTIN) 300 MG capsule Take 300-600 mg by mouth See admin instructions. Take 300 mg in the morning, 300 mg in the afternoon, and 600 mg at night  1   irbesartan (AVAPRO) 300 MG tablet Take 1 tablet (300 mg total) by mouth daily. 30 tablet 0   Magnesium 250 MG TABS Take 1 tablet (250 mg total) by mouth daily. 30 tablet 0   Menthol, Topical Analgesic, (ICY HOT EX) Apply 1 Application topically daily as needed (pain).     naproxen sodium (ALEVE) 220 MG tablet Take 440 mg by mouth daily as needed (pain).     polyethylene glycol (MIRALAX / GLYCOLAX) 17 g packet Take 17 g by mouth daily as needed for moderate constipation.     Propylene Glycol (SYSTANE BALANCE) 0.6 % SOLN Place 1 drop into both eyes 2 (two) times daily as needed (dry eyes).     rosuvastatin (CRESTOR) 40 MG tablet Take 1 tablet (40 mg total) by mouth daily. NEED APPOINTMENT 90 tablet 3   sertraline (ZOLOFT) 25 MG tablet Take 50 mg by  mouth daily.      Tiotropium Bromide-Olodaterol (STIOLTO RESPIMAT) 2.5-2.5 MCG/ACT AERS Inhale 2 puffs into the lungs daily.     tobramycin, PF, (TOBI) 300 MG/5ML nebulizer solution Take 5 mLs (300 mg total) by nebulization 2 (two) times daily. 280 mL 0   vitamin B-12 (CYANOCOBALAMIN) 100 MCG tablet Take 100 mcg by mouth daily.     Tiotropium Bromide-Olodaterol (STIOLTO RESPIMAT) 2.5-2.5 MCG/ACT AERS Inhale 2 puffs into the lungs daily. 4 g 5   Varenicline Tartrate (TYRVAYA) 0.03 MG/ACT SOLN Place 1 spray into the nose daily. (Patient not taking: Reported on 07/31/2023)     inFLIXimab (REMICADE IV) Inject 100 mg into the vein every 6 (six) weeks. (Patient not taking: Reported on 07/31/2023)     pantoprazole (PROTONIX) 40 MG tablet Take 40 mg by mouth daily. (Patient not taking: Reported on 07/31/2023)     No facility-administered medications prior to visit.    Review of Systems  Constitutional:  Negative for chills, diaphoresis, fever, malaise/fatigue and weight loss.  HENT:  Positive for congestion.   Respiratory:  Positive for cough. Negative for hemoptysis, sputum production, shortness of breath and wheezing.   Cardiovascular:  Negative for chest pain, palpitations and leg swelling.    Objective:   Vitals:   07/31/23 1119  BP: 118/72  Pulse: 88  Resp: 16  SpO2: 96%  Weight: 155 lb 11.2 oz (70.6 kg)  Height: 5\' 2"  (1.575 m)    Physical Exam: General: Well-appearing, no acute distress HENT: [place], AT Eyes: EOMI, no scleral icterus Respiratory: Clear to auscultation bilaterally.  No crackles, wheezing or rales Cardiovascular: RRR, -M/R/G, no JVD Extremities:-Edema,-tenderness Neuro: AAO x4, CNII-XII grossly intact Psych: Normal mood, normal affect  Chest imaging: CXR 01/11/18 - No pulmonary edema, effusion or infiltrate CT HR 07/28/21 - RLL nodule 1.4 x 1 and RUL ground glas nodule. Otherwise normal parenchyma. Moderate/large hiatal hernia. 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 Unchanged coarse interstitial markings suggestive of chronic bronchitis CT Chest 12/21/21 - Right lower lobe nodule decreased from 14mm to 9 mm. Stable GGO. CT Chest 06/07/22 - Resolved RLL with cyst. Stable nodules including RUL and LUL Interval development of LUL nodules with largest 6mm. CT Chest 12/18/22 - Stable RLL lung nodule. Resolved nodules mentioned before  PFT:  08/21/18  FVC 2.4 (154%) FEV1 2.21 (156%) Ratio 74 TLC 104% DLCO corrected 78% Interpretation: Normal spirometry and lung volumes with mildly reduced DLCO  Mild obstructive defect present with mildly reduced DLCO. TLC normal. No significant bronchodilator effect present however does  not preclude benefit of bronchodilator therapy.  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 Mild bronchitis symptoms. On LAMA/LABA with good control. Reports new nasal congestion worsening cough.  Pseudomonas colonization - s/p tobramycin. No persistent active symptoms Immunosuppressed patient --Completed tobramycin in 2023 --If persistent PA, may need to refer to ID again for extended tobra treatment  Chronic bronchitis - improved --CONTINUE Stiolto TWO puffs in the morning. REFILLED --CONTINUE Albuterol every 4 hours as needed for shortness of breath or wheezing --Continue Protonix 40 mg daily in setting of known hiatal hernia  Nasal congestion --START flonase 1-2 sprays nightly per nare  RLL lung nodularity- stable 9mm Interval development of RUL 5mm ground glass nodule - likely benign History of waxing and waning nodules. Suspect all benign in currently asymptomatic patient After discussion  regarding risks and benefits, would favor to stop surveillance as we would likely not pursue intervention unless her symptoms worsened. No further chest imaging indicated  Immunization History  Administered Date(s) Administered   Fluad Quad(high Dose 65+) 04/21/2019, 05/20/2021   Influenza Split 05/22/2012, 04/16/2014   Influenza, High Dose Seasonal PF 05/20/2018, 05/24/2020   Influenza-Unspecified 04/26/2015, 06/16/2019   PFIZER(Purple Top)SARS-COV-2 Vaccination 09/11/2019, 09/25/2019, 05/14/2020, 11/25/2020   Pfizer Covid-19 Vaccine Bivalent Booster 24yrs & up 05/08/2021   Pneumococcal Conjugate-13 04/16/2014   Pneumococcal Polysaccharide-23 04/26/2015   Tdap 10/19/2014   Meds ordered this encounter  Medications   Tiotropium Bromide-Olodaterol (STIOLTO RESPIMAT) 2.5-2.5 MCG/ACT AERS    Sig: Inhale 2 puffs into the lungs daily.    Dispense:  4 g    Refill:  11   No orders of the defined types were placed in this encounter.  Return in about 10 months (around 05/30/2024).  I have spent a total time of 30-minutes on the day of the appointment including chart review, data review, collecting history, coordinating care and discussing medical diagnosis and plan with the patient/family. Past medical history, allergies, medications were reviewed. Pertinent imaging, labs and tests included in this note have been reviewed and interpreted independently by me.  Azarel Banner Mechele Collin, MD [facility] 07/31/2023

## 2023-08-08 DIAGNOSIS — M25561 Pain in right knee: Secondary | ICD-10-CM | POA: Diagnosis not present

## 2023-08-08 DIAGNOSIS — G44209 Tension-type headache, unspecified, not intractable: Secondary | ICD-10-CM | POA: Diagnosis not present

## 2023-08-08 DIAGNOSIS — R0989 Other specified symptoms and signs involving the circulatory and respiratory systems: Secondary | ICD-10-CM | POA: Diagnosis not present

## 2023-08-08 DIAGNOSIS — R82998 Other abnormal findings in urine: Secondary | ICD-10-CM | POA: Diagnosis not present

## 2023-08-08 DIAGNOSIS — G8929 Other chronic pain: Secondary | ICD-10-CM | POA: Diagnosis not present

## 2023-08-08 DIAGNOSIS — Z6827 Body mass index (BMI) 27.0-27.9, adult: Secondary | ICD-10-CM | POA: Diagnosis not present

## 2023-08-20 ENCOUNTER — Telehealth: Payer: Self-pay

## 2023-08-20 NOTE — Telephone Encounter (Signed)
   Pre-operative Risk Assessment    Patient Name: Joy [name]  DOB: [birth year] MRN: [id number]   Date of last office visit: 05/13/23 Date of next office visit: Not scheduled   Request for Surgical Clearance    Procedure:  Right Total Knee Revision  Date of Surgery:  Clearance 11/20/23                                Surgeon:  Dr. Dempsey Moan Surgeon's Group or Practice Name:  [facility] Phone number:  [phone] Fax number:  [phone]   Type of Clearance Requested:   - Medical    Type of Anesthesia:   Choice   Additional requests/questions:    [name]   08/20/2023, 3:21 PM

## 2023-08-22 NOTE — Telephone Encounter (Signed)
 Left message to call back to schedule tele pre op appt.

## 2023-08-22 NOTE — Telephone Encounter (Signed)
 Primary Cardiologist:Tiffany Raford, MD   Preoperative team, please contact this patient and set up a phone call appointment no sooner than 30 days prior to surgery for further preoperative risk assessment. Please obtain consent and complete medication review. Thank you for your help.   No request to hold cardiac medications.   I also confirmed the patient resides in the state of [place] . As per [facility] Medical Board telemedicine laws, the patient must reside in the state in which the provider is licensed.   Joy EMERSON Bane, NP-C  08/22/2023, 1:17 PM [address], [address] Office [phone] Fax [phone]

## 2023-08-23 NOTE — Telephone Encounter (Signed)
 2nd attempt to reach pt regarding surgical clearance and the need for an TELE appointment.  Left pt a detailed message to call back and get that scheduled.

## 2023-08-26 NOTE — Telephone Encounter (Signed)
 2nd attempt to reach the pt to schedule tele preop appt.

## 2023-08-27 DIAGNOSIS — M0589 Other rheumatoid arthritis with rheumatoid factor of multiple sites: Secondary | ICD-10-CM | POA: Diagnosis not present

## 2023-08-27 NOTE — Telephone Encounter (Signed)
 Patient is returning call.

## 2023-08-28 ENCOUNTER — Telehealth: Payer: Self-pay

## 2023-08-28 NOTE — Telephone Encounter (Signed)
 Preop televisit scheduled, med rec and consent done

## 2023-08-28 NOTE — Telephone Encounter (Signed)
  Patient Consent for Virtual Visit        Joy [name] has provided verbal consent on 08/28/2023 for a virtual visit (video or telephone).   CONSENT FOR VIRTUAL VISIT FOR:  Joy [name]  By participating in this virtual visit I agree to the following:  I hereby voluntarily request, consent and authorize [facility] and its employed or contracted physicians, physician assistants, nurse practitioners or other licensed health care professionals (the Practitioner), to provide me with telemedicine health care services (the "Services) as deemed necessary by the treating Practitioner. I acknowledge and consent to receive the Services by the Practitioner via telemedicine. I understand that the telemedicine visit will involve communicating with the Practitioner through live audiovisual communication technology and the disclosure of certain medical information by electronic transmission. I acknowledge that I have been given the opportunity to request an in-person assessment or other available alternative prior to the telemedicine visit and am voluntarily participating in the telemedicine visit.  I understand that I have the right to withhold or withdraw my consent to the use of telemedicine in the course of my care at any time, without affecting my right to future care or treatment, and that the Practitioner or I may terminate the telemedicine visit at any time. I understand that I have the right to inspect all information obtained and/or recorded in the course of the telemedicine visit and may receive copies of available information for a reasonable fee.  I understand that some of the potential risks of receiving the Services via telemedicine include:  Delay or interruption in medical evaluation due to technological equipment failure or disruption; Information transmitted may not be sufficient (e.g. poor resolution of images) to allow for appropriate medical decision making by the  Practitioner; and/or  In rare instances, security protocols could fail, causing a breach of personal health information.  Furthermore, I acknowledge that it is my responsibility to provide information about my medical history, conditions and care that is complete and accurate to the best of my ability. I acknowledge that Practitioner's advice, recommendations, and/or decision may be based on factors not within their control, such as incomplete or inaccurate data provided by me or distortions of diagnostic images or specimens that may result from electronic transmissions. I understand that the practice of medicine is not an exact science and that Practitioner makes no warranties or guarantees regarding treatment outcomes. I acknowledge that a copy of this consent can be made available to me via my patient portal [facility] MyChart), or I can request a printed copy by calling the office of [facility].    I understand that my insurance will be billed for this visit.   I have read or had this consent read to me. I understand the contents of this consent, which adequately explains the benefits and risks of the Services being provided via telemedicine.  I have been provided ample opportunity to ask questions regarding this consent and the Services and have had my questions answered to my satisfaction. I give my informed consent for the services to be provided through the use of telemedicine in my medical care

## 2023-10-01 DIAGNOSIS — M4726 Other spondylosis with radiculopathy, lumbar region: Secondary | ICD-10-CM | POA: Diagnosis not present

## 2023-10-22 DIAGNOSIS — R5383 Other fatigue: Secondary | ICD-10-CM | POA: Diagnosis not present

## 2023-10-22 DIAGNOSIS — M0589 Other rheumatoid arthritis with rheumatoid factor of multiple sites: Secondary | ICD-10-CM | POA: Diagnosis not present

## 2023-10-22 DIAGNOSIS — Z79899 Other long term (current) drug therapy: Secondary | ICD-10-CM | POA: Diagnosis not present

## 2023-10-22 DIAGNOSIS — Z111 Encounter for screening for respiratory tuberculosis: Secondary | ICD-10-CM | POA: Diagnosis not present

## 2023-10-23 ENCOUNTER — Ambulatory Visit: Payer: Medicare Other | Attending: Nurse Practitioner

## 2023-10-23 DIAGNOSIS — Z01810 Encounter for preprocedural cardiovascular examination: Secondary | ICD-10-CM

## 2023-10-23 NOTE — Progress Notes (Addendum)
 Virtual Visit via Telephone Note   Because of Joy [name] co-morbid illnesses, she is at least at moderate risk for complications without adequate follow up.  This format is felt to be most appropriate for this patient at this time.  Due to technical limitations with video connection (technology), today's appointment will be conducted as an audio only telehealth visit, and Joy [name] verbally agreed to proceed in this manner.   All issues noted in this document were discussed and addressed.  No physical exam could be performed with this format.  Evaluation Performed:  Preoperative cardiovascular risk assessment _____________   Date:  10/23/2023   Patient ID:  Joy [name], DOB Dec 31, 1940, MRN [id number] Patient Location:  Home Provider location:   Office  Primary Care Provider:  Jarrett Soho, PA-C Primary Cardiologist:  Chilton Si, MD  Chief Complaint / Patient Profile   83 y.o. y/o female with a h/o HLD, prediabetes, HTN, GERD, COPD who is pending right total knee revision and presents today for telephonic preoperative cardiovascular risk assessment.  History of Present Illness    Joy [name] is a 83 y.o. female who presents via audio/video conferencing for a telehealth visit today.  Pt was last seen in cardiology clinic on 05/13/2023 by Dr. Duke Salvia.  At that time Joy [name] was doing well but reported worsening back and leg pain..  The patient is now pending procedure as outlined above.   Addendum: -Patient contacted and reported that her surgery date has been moved out further at least 2 months and will contact our office once new procedure date is arranged.  Today's visit will be canceled and patient will not be charged for this call.   Past Medical History    Past Medical History:  Diagnosis Date   Ankylosing spondylitis (HCC)    Anxiety    Arthritis    RHEUMATOID   Back pain    Bronchitis    Constipation    COPD (chronic  obstructive pulmonary disease) (HCC)    CXR 01/11/18 showed mild COPD and chronic bronchitis   CTS (carpal tunnel syndrome)    Dry eye    Dry mouth    Dysrhythmia    "irregularity" unknown at this time - being evaluated by cardiology   Essential hypertension 11/24/2015   GERD (gastroesophageal reflux disease)    HLA B27 (HLA B27 positive)    Hypercholesteremia    Hyperlipidemia 11/24/2015   Hypertension    IBS (irritable bowel syndrome)    Joint pain    Neuropathy    OAB (overactive bladder)    Obesity    Osteoarthritis    Osteoporosis    Pre-diabetes    Rheumatoid arthritis (HCC) 11/24/2015   Sciatica    Seasonal allergies    Spinal stenosis    Past Surgical History:  Procedure Laterality Date   ABDOMINAL HYSTERECTOMY  1995   BACK SURGERY     BREAST SURGERY     REDUCTION   CATARACT EXTRACTION W/PHACO  08/01/2012   Procedure: CATARACT EXTRACTION PHACO AND INTRAOCULAR LENS PLACEMENT (IOC);  Surgeon: Chalmers Guest, MD;  Location: [facility] OR;  Service: Ophthalmology;  Laterality: Right;   EYE SURGERY Bilateral    cataract   HERNIA REPAIR     RIGHT ING.   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 Authorizing Provider  albuterol (VENTOLIN HFA) 108 (90 Base) MCG/ACT inhaler Inhale 2 puffs into the lungs every 4 (four) hours as needed for wheezing or shortness of breath. 01/31/22   Luciano Cutter, MD  amLODipine (NORVASC) 2.5 MG tablet TAKE 1 TABLET(2.5 MG) BY MOUTH DAILY 02/25/23   Chilton Si, MD   Apoaequorin (PREVAGEN PO) Take by mouth.    [provider]  Cholecalciferol (VITAMIN D) 50 MCG (2000 UT) tablet Take 2,000 Units by mouth daily.    [provider]  diphenhydrAMINE (BENADRYL) 25 MG tablet Take 25 mg by mouth daily as needed for allergies.    [provider]  gabapentin (NEURONTIN) 300 MG capsule Take 300-600 mg by mouth See admin instructions. Take 300 mg in the morning, 300 mg in the afternoon, and 600 mg at night 03/16/22   Luciano Cutter, MD  irbesartan (AVAPRO) 300 MG tablet Take 1 tablet (300 mg total) by mouth daily. 07/09/18   Margit Hanks, MD  Magnesium 250 MG TABS Take 1 tablet (250 mg total) by mouth daily. 07/09/18   Margit Hanks, MD  Menthol, Topical Analgesic, (ICY HOT EX) Apply 1 Application topically daily as needed (pain).    [provider]  naproxen sodium (ALEVE) 220 MG tablet Take 440 mg by mouth daily as needed (pain).    [provider]  polyethylene glycol (MIRALAX / GLYCOLAX) 17 g packet Take 17 g by mouth daily as needed for moderate constipation.    [provider]  Propylene Glycol (SYSTANE BALANCE) 0.6 % SOLN Place 1 drop into both eyes 2 (two) times daily as needed (dry eyes).    [provider]  rosuvastatin (CRESTOR) 40 MG tablet Take 1 tablet (40 mg total) by mouth daily. NEED APPOINTMENT 11/05/22   Alver Sorrow, NP  sertraline (ZOLOFT) 25 MG tablet Take 50 mg by mouth daily.     [provider]  Tiotropium Bromide-Olodaterol (STIOLTO RESPIMAT) 2.5-2.5 MCG/ACT AERS Inhale 2 puffs into the lungs daily. 05/15/23   Luciano Cutter, MD  Tiotropium Bromide-Olodaterol (STIOLTO RESPIMAT) 2.5-2.5 MCG/ACT AERS Inhale 2 puffs into the lungs daily. 07/31/23   Luciano Cutter, MD  tobramycin, PF, (TOBI) 300 MG/5ML nebulizer solution Take 5 mLs (300 mg total) by nebulization 2 (two) times daily. 09/28/21   Gardiner Barefoot, MD  Varenicline Tartrate (TYRVAYA) 0.03 MG/ACT SOLN  Place 1 spray into the nose daily.    [provider]  vitamin B-12 (CYANOCOBALAMIN) 100 MCG tablet Take 100 mcg by mouth daily.    [provider]    Physical Exam    Vital Signs:  Joy [name] does not have vital signs available for review today.  Given telephonic nature of communication, physical exam is limited. AAOx3. NAD. Normal affect.  Speech and respirations are unlabored.  Accessory Clinical Findings    None  Assessment & Plan    1.  Preoperative Cardiovascular Risk Assessment:  Patient will contact us with new surgical date and clearance will be arranged at that time. -No clearance  granted today due to new procedure date as noted above.  Time:   Today, I have spent  minutes with the patient with telehealth technology discussing medical history, symptoms, and management plan.     Napoleon Form, Leodis Rains, NP  10/23/2023, 7:16 AM

## 2023-10-29 DIAGNOSIS — M4726 Other spondylosis with radiculopathy, lumbar region: Secondary | ICD-10-CM | POA: Diagnosis not present

## 2023-10-29 DIAGNOSIS — M546 Pain in thoracic spine: Secondary | ICD-10-CM | POA: Diagnosis not present

## 2023-11-05 DIAGNOSIS — M0589 Other rheumatoid arthritis with rheumatoid factor of multiple sites: Secondary | ICD-10-CM | POA: Diagnosis not present

## 2023-11-05 DIAGNOSIS — Z6827 Body mass index (BMI) 27.0-27.9, adult: Secondary | ICD-10-CM | POA: Diagnosis not present

## 2023-11-05 DIAGNOSIS — E663 Overweight: Secondary | ICD-10-CM | POA: Diagnosis not present

## 2023-11-05 DIAGNOSIS — M1991 Primary osteoarthritis, unspecified site: Secondary | ICD-10-CM | POA: Diagnosis not present

## 2023-11-05 DIAGNOSIS — Z79899 Other long term (current) drug therapy: Secondary | ICD-10-CM | POA: Diagnosis not present

## 2023-11-05 DIAGNOSIS — Z1589 Genetic susceptibility to other disease: Secondary | ICD-10-CM | POA: Diagnosis not present

## 2023-11-11 DIAGNOSIS — M48062 Spinal stenosis, lumbar region with neurogenic claudication: Secondary | ICD-10-CM | POA: Diagnosis not present

## 2023-11-11 DIAGNOSIS — M545 Low back pain, unspecified: Secondary | ICD-10-CM | POA: Diagnosis not present

## 2023-11-12 ENCOUNTER — Encounter (HOSPITAL_COMMUNITY): Admission: RE | Admit: 2023-11-12 | Payer: Medicare Other | Source: Ambulatory Visit

## 2023-11-20 ENCOUNTER — Inpatient Hospital Stay (HOSPITAL_COMMUNITY): Admit: 2023-11-20 | Payer: Medicare Other | Admitting: Orthopedic Surgery

## 2023-11-20 DIAGNOSIS — M546 Pain in thoracic spine: Secondary | ICD-10-CM | POA: Diagnosis not present

## 2023-11-20 DIAGNOSIS — M545 Low back pain, unspecified: Secondary | ICD-10-CM | POA: Diagnosis not present

## 2023-11-20 DIAGNOSIS — M40204 Unspecified kyphosis, thoracic region: Secondary | ICD-10-CM | POA: Diagnosis not present

## 2023-11-20 DIAGNOSIS — M4314 Spondylolisthesis, thoracic region: Secondary | ICD-10-CM | POA: Diagnosis not present

## 2023-11-20 DIAGNOSIS — M4804 Spinal stenosis, thoracic region: Secondary | ICD-10-CM | POA: Diagnosis not present

## 2023-11-20 SURGERY — TOTAL KNEE REVISION
Anesthesia: Choice | Site: Knee | Laterality: Right

## 2023-11-22 ENCOUNTER — Ambulatory Visit: Attending: Neurosurgery | Admitting: Physical Therapy

## 2023-11-22 ENCOUNTER — Other Ambulatory Visit: Payer: Self-pay

## 2023-11-22 DIAGNOSIS — M5441 Lumbago with sciatica, right side: Secondary | ICD-10-CM | POA: Diagnosis not present

## 2023-11-22 DIAGNOSIS — R2681 Unsteadiness on feet: Secondary | ICD-10-CM | POA: Diagnosis not present

## 2023-11-22 DIAGNOSIS — R293 Abnormal posture: Secondary | ICD-10-CM | POA: Diagnosis not present

## 2023-11-22 DIAGNOSIS — M5442 Lumbago with sciatica, left side: Secondary | ICD-10-CM | POA: Insufficient documentation

## 2023-11-22 DIAGNOSIS — R262 Difficulty in walking, not elsewhere classified: Secondary | ICD-10-CM | POA: Insufficient documentation

## 2023-11-22 DIAGNOSIS — R279 Unspecified lack of coordination: Secondary | ICD-10-CM | POA: Insufficient documentation

## 2023-11-22 DIAGNOSIS — G8929 Other chronic pain: Secondary | ICD-10-CM | POA: Insufficient documentation

## 2023-11-22 DIAGNOSIS — M6281 Muscle weakness (generalized): Secondary | ICD-10-CM | POA: Diagnosis not present

## 2023-11-22 NOTE — Therapy (Signed)
 OUTPATIENT PHYSICAL THERAPY THORACOLUMBAR EVALUATION   Patient Name: SHUNTE SENSENEY MRN: [id number] DOB:1941/05/18, 83 y.o., female Today's Date: 11/22/2023  END OF SESSION:  PT End of Session - 11/22/23 0926     Visit Number 1    Date for PT Re-Evaluation 01/17/24    Authorization Type Medicare    Progress Note Due on Visit 10    PT Start Time 0930    PT Stop Time 1010    PT Time Calculation (min) 40 min    Activity Tolerance Patient tolerated treatment well    Behavior During Therapy [facility] for tasks assessed/performed             Past Medical History:  Diagnosis Date   Ankylosing spondylitis (HCC)    Anxiety    Arthritis    RHEUMATOID   Back pain    Bronchitis    Constipation    COPD (chronic obstructive pulmonary disease) (HCC)    CXR 01/11/18 showed mild COPD and chronic bronchitis   CTS (carpal tunnel syndrome)    Dry eye    Dry mouth    Dysrhythmia    "irregularity" unknown at this time - being evaluated by cardiology   Essential hypertension 11/24/2015   GERD (gastroesophageal reflux disease)    HLA B27 (HLA B27 positive)    Hypercholesteremia    Hyperlipidemia 11/24/2015   Hypertension    IBS (irritable bowel syndrome)    Joint pain    Neuropathy    OAB (overactive bladder)    Obesity    Osteoarthritis    Osteoporosis    Pre-diabetes    Rheumatoid arthritis (HCC) 11/24/2015   Sciatica    Seasonal allergies    Spinal stenosis    Past Surgical History:  Procedure Laterality Date   ABDOMINAL HYSTERECTOMY  1995   BACK SURGERY     BREAST SURGERY     REDUCTION   CATARACT EXTRACTION W/PHACO  08/01/2012   Procedure: CATARACT EXTRACTION PHACO AND INTRAOCULAR LENS PLACEMENT (IOC);  Surgeon: Chalmers Guest, MD;  Location: [facility] OR;  Service: Ophthalmology;  Laterality: Right;   EYE SURGERY Bilateral    cataract   HERNIA REPAIR     RIGHT ING.   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   Medication monitoring encounter 09/15/2021   Pain of left hip joint 08/10/2020   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   Prediabetes 02/01/2020   Body mass index (BMI) 36.0-36.9, adult 02/01/2020   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   Inflammation of sacroiliac joint (HCC) 10/06/2018   Chronic bronchitis (HCC) 09/25/2018   Mucopurulent chronic bronchitis (HCC) 08/21/2018   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   Spondylosis 06/13/2018   Status post lumbar spinal fusion 06/13/2018   Status post lumbar spine surgery for decompression of spinal cord 06/13/2018   History of total knee replacement, 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   History  of total knee arthroplasty, right 09/20/2017   OA (osteoarthritis) of knee 12/26/2015   Essential hypertension 11/24/2015   Hyperlipidemia 11/24/2015   Rheumatoid arthritis (HCC) 11/24/2015   Spinal stenosis of lumbar region 08/24/2014   Lumbar spondylosis 07/21/2014    PCP: Jarrett Soho, PA-C  REFERRING PROVIDER: Marlyne Beards, PA-C   REFERRING DIAG: [id number] (ICD-10-CM) - Other spondylosis with radiculopathy, lumbar region   Rationale for Evaluation and Treatment: Rehabilitation  THERAPY DIAG:  Chronic bilateral low back pain with bilateral sciatica  Difficulty in walking, not elsewhere classified  Muscle weakness  (generalized)  Unsteadiness on feet  Abnormal posture  ONSET DATE: Chronic   SUBJECTIVE:                                                                                                                                                                                           SUBJECTIVE STATEMENT: Pt reports continued problems in her low back and down to L LE and into foot. Pt states this pain becomes very severe. R knee has been replaced but needs a revision. Prednisone has been helping. Pt does note issues with her balance for the last couple years.   PERTINENT HISTORY:  3-4 back surgeries but no rods, RA, osteoporosis  PAIN:  Are you having pain? Yes: NPRS scale: no pain currently because of prednisone, average 7 or 8, 10 at worst Pain location: bilat low back but radiates down to L LE Pain description: sharp Aggravating factors: worst in the morning  Relieving factors: once starts to move it feels better  PRECAUTIONS: Fall  RED FLAGS: None   WEIGHT BEARING RESTRICTIONS: No  FALLS:  Has patient fallen in last 6 months? Yes. Number of falls 1 - went to mailbox, turned to walk up hill and fell  LIVING ENVIRONMENT: Lives with: lives with their family (son and daughter) Lives in: House/apartment Stairs: Yes: Internal: 14 steps; on right going up Has following equipment at home: None  OCCUPATION: Retired; shopping, thinking about going to [employer], loves doing yard work but worried about falls  PLOF: Independent  PATIENT GOALS: Improve balance and back pain  NEXT MD VISIT: n/a  OBJECTIVE:  Note: Objective measures were completed at Evaluation unless otherwise noted.  DIAGNOSTIC FINDINGS:  Lumbar CT in 07/06/22 IMPRESSION: 1. Prior thoracolumbar fusion as detailed above. Unchanged lucency about the T11, L3, and S1 screws associated with absent solid arthrodesis at T11-12, L2-3, and L5-S1. 2. Suspected chronic severe left and moderate right neural foraminal stenosis  at L5-S1. 3. Mild-to-moderate neural foraminal stenosis at L2-3. 4. Widely patent lumbar spinal canal following decompression. 5. Mild thoracic  disc degeneration and advanced upper thoracic facet arthrosis without significant stenosis. 6.  Aortic Atherosclerosis (ICD10-I70.0).  PATIENT SURVEYS:  Modified Oswestry Low Back Pain Disability Questionnaire: 20 / 50 = 40.0 %  COGNITION: Overall cognitive status: Within functional limits for tasks assessed     SENSATION: WFL  MUSCLE LENGTH: Hamstrings: Right (Limited due to prior knee TKA); Left 80 deg deg Thomas test: did not assess  POSTURE: decreased lumbar lordosis, increased thoracic kyphosis, and flexed trunk   PALPATION: TTP bilat lumbar paraspinals, L>R glute max and piriformis  LUMBAR ROM:   AROM eval  Flexion 80%  Extension 10%  Right lateral flexion 1" above knee *  Left lateral flexion 1" above knee *  Right rotation 100%  Left rotation 100%   (Blank rows = not tested)  LOWER EXTREMITY ROM:     Active  Right eval Left eval  Hip flexion    Hip extension    Hip abduction    Hip adduction    Hip internal rotation    Hip external rotation    Knee flexion    Knee extension    Ankle dorsiflexion    Ankle plantarflexion    Ankle inversion    Ankle eversion     (Blank rows = not tested)  LOWER EXTREMITY MMT:    MMT Right eval Left eval  Hip flexion 4 4  Hip extension 3 3  Hip abduction 3+ 3+  Hip adduction    Hip internal rotation    Hip external rotation    Knee flexion 4 4  Knee extension 5 5  Ankle dorsiflexion 4- 4-  Ankle plantarflexion    Ankle inversion    Ankle eversion     (Blank rows = not tested)  LUMBAR SPECIAL TESTS:  Straight leg raise test: Negative, Single leg stance test: Positive, and FABER test: Positive  FUNCTIONAL TESTS:  5 times sit to stand: 15.43 sec Berg Balance Scale: 44/56  [facility] PT Assessment - 11/22/23 0001       Standardized Balance Assessment   Standardized  Balance Assessment Berg Balance Test      Berg Balance Test   Sit to Stand Able to stand  independently using hands    Standing Unsupported Able to stand safely 2 minutes    Sitting with Back Unsupported but Feet Supported on Floor or Stool Able to sit safely and securely 2 minutes    Stand to Sit Sits safely with minimal use of hands    Transfers Able to transfer safely, minor use of hands    Standing Unsupported with Eyes Closed Able to stand 10 seconds with supervision    Standing Unsupported with Feet Together Able to place feet together independently and stand for 1 minute with supervision    From Standing, Reach Forward with Outstretched Arm Can reach forward >12 cm safely (5")    From Standing Position, Pick up Object from Floor Able to pick up shoe safely and easily    From Standing Position, Turn to Look Behind Over each Shoulder Looks behind from both sides and weight shifts well    Turn 360 Degrees Able to turn 360 degrees safely but slowly   more imbalanced turning R   Standing Unsupported, Alternately Place Feet on Step/Stool Able to complete 4 steps without aid or supervision    Standing Unsupported, One Foot in Front Able to take small step independently and hold 30 seconds    Standing on One Leg Able to lift leg  independently and hold equal to or more than 3 seconds    Total Score 44              GAIT: Distance walked: Into clinic Assistive device utilized: Single point cane Level of assistance: Modified independence Comments: diminished bilat step length, forward flexed posture, trendelenburg bilat  TREATMENT DATE: 11/22/23 See HEP below                                                                                                                                 PATIENT EDUCATION:  Education details: Exam findings, POC, initial HEP Person educated: Patient Education method: Explanation, Demonstration, and Handouts Education comprehension: verbalized  understanding, returned demonstration, and needs further education  HOME EXERCISE PROGRAM: Access Code: [email or web address] URL: [email or web address] Date: 11/22/2023 Prepared by: [name] Prey April Kirstie Peri  Exercises - Seated Piriformis Stretch  - 1 x daily - 7 x weekly - 2 sets - 30 sec hold - Seated Hamstring Stretch  - 1 x daily - 7 x weekly - 2 sets - 30 sec hold - Right Standing Lateral Shift Correction at Wall - Hold  - 1 x daily - 7 x weekly - 2 sets - 30 sec hold - Standing Lumbar Extension at Wall - Forearms  - 1 x daily - 7 x weekly - 2 sets - 30 sec hold  ASSESSMENT:  CLINICAL IMPRESSION: Patient is a 83 y.o. F who was seen today for physical therapy evaluation and treatment for low back and decreased balance. PMH significant for back surgeries with chronic back pain, R TKA (needing revision), neuropathy, and recent fall. Assessment significant for gross bilat hip and ankle weakness, L>R hip muscle tightness with concurrent trigger points, decreased balance, and decreased lumbar/hip ROM affecting pt's standing posture and mobility with home and community tasks. Pt will benefit from PT to address these issues for improved overall mobility.   OBJECTIVE IMPAIRMENTS: Abnormal gait, decreased balance, decreased coordination, decreased endurance, decreased mobility, difficulty walking, decreased ROM, decreased strength, hypomobility, increased fascial restrictions, increased muscle spasms, impaired flexibility, improper body mechanics, postural dysfunction, and pain.   ACTIVITY LIMITATIONS: lifting, bending, standing, squatting, stairs, transfers, bed mobility, and locomotion level  PARTICIPATION LIMITATIONS: meal prep, cleaning, laundry, shopping, community activity, and yard work  PERSONAL FACTORS: Age, Fitness, Past/current experiences, and Time since onset of injury/illness/exacerbation are also affecting patient's functional outcome.   REHAB POTENTIAL: Good  CLINICAL  DECISION MAKING: Evolving/moderate complexity  EVALUATION COMPLEXITY: Moderate   GOALS: Goals reviewed with patient? Yes  SHORT TERM GOALS: Target date: 12/20/2023   Pt will be ind with initial HEP Baseline: Goal status: INITIAL  2.  Pt will demo L = R figure 4 stretch for improved hip flexibility and ROM Baseline:  Goal status: INITIAL  3.  Pt will be able to maintain increased trunk extension with standing and walking posture independently for improved stability Baseline: Trunk flexed ~10-20 deg Goal status: INITIAL  LONG TERM GOALS: Target date: 01/17/2024   Pt will be ind with management and progression of HEP Baseline:  Goal status: INITIAL  2.  Pt will have improved 5x STS to </=13 sec to demo increased functional LE strength and decreased fall risk Baseline: 15.43 sec Goal status: INITIAL  3.  Pt will have improved Berg Balance Score to >/=52 to demo MCID Baseline: 44 Goal status: INITIAL  4.  Pt will have improved modified Oswestry score to </=30% to demo MCID Baseline: 40% Goal status: INITIAL  5.  Pt will report >/=50% improvement in overall pain Baseline:  Goal status: INITIAL   PLAN:  PT FREQUENCY: 2x/week  PT DURATION: 8 weeks  PLANNED INTERVENTIONS: [id number]- PT Re-evaluation, 97110-Therapeutic exercises, 97530- Therapeutic activity, 97112- Neuromuscular re-education, 97535- Self Care, 24401- Manual therapy, [phone]- Gait training, [phone]- Aquatic Therapy, [phone]- Electrical stimulation (unattended), [phone]- Ionotophoresis 4mg /ml Dexamethasone, Patient/Family education, Balance training, Stair training, Taping, Dry Needling, Joint mobilization, Spinal mobilization, Cryotherapy, and Moist heat.  PLAN FOR NEXT SESSION: Assess response to HEP. Continue to improve hip/lumbar mobility as tolerated. Initiate core and hip strengthening. Work on improving postural stability and balance.    Clide Remmers April Ma L Khalani Novoa, PT 11/22/2023, 10:58 AM

## 2023-11-27 ENCOUNTER — Ambulatory Visit: Admitting: Physical Therapy

## 2023-11-27 ENCOUNTER — Encounter: Payer: Self-pay | Admitting: Physical Therapy

## 2023-11-27 DIAGNOSIS — R2681 Unsteadiness on feet: Secondary | ICD-10-CM

## 2023-11-27 DIAGNOSIS — M5441 Lumbago with sciatica, right side: Secondary | ICD-10-CM | POA: Diagnosis not present

## 2023-11-27 DIAGNOSIS — G8929 Other chronic pain: Secondary | ICD-10-CM | POA: Diagnosis not present

## 2023-11-27 DIAGNOSIS — M6281 Muscle weakness (generalized): Secondary | ICD-10-CM | POA: Diagnosis not present

## 2023-11-27 DIAGNOSIS — R262 Difficulty in walking, not elsewhere classified: Secondary | ICD-10-CM | POA: Diagnosis not present

## 2023-11-27 DIAGNOSIS — M5442 Lumbago with sciatica, left side: Secondary | ICD-10-CM | POA: Diagnosis not present

## 2023-11-27 DIAGNOSIS — R293 Abnormal posture: Secondary | ICD-10-CM

## 2023-11-27 NOTE — Therapy (Signed)
 OUTPATIENT PHYSICAL THERAPY THORACOLUMBAR TREATMENT   Patient Name: Joy [name] MRN: [id number] DOB:08/11/1941, 83 y.o., female Today's Date: 11/27/2023  END OF SESSION:  PT End of Session - 11/27/23 1013     Visit Number 2    Date for PT Re-Evaluation 01/17/24    PT Start Time 1015    PT Stop Time 1100    PT Time Calculation (min) 45 min    Activity Tolerance Patient tolerated treatment well    Behavior During Therapy [facility] for tasks assessed/performed             Past Medical History:  Diagnosis Date   Ankylosing spondylitis (HCC)    Anxiety    Arthritis    RHEUMATOID   Back pain    Bronchitis    Constipation    COPD (chronic obstructive pulmonary disease) (HCC)    CXR 01/11/18 showed mild COPD and chronic bronchitis   CTS (carpal tunnel syndrome)    Dry eye    Dry mouth    Dysrhythmia    "irregularity" unknown at this time - being evaluated by cardiology   Essential hypertension 11/24/2015   GERD (gastroesophageal reflux disease)    HLA B27 (HLA B27 positive)    Hypercholesteremia    Hyperlipidemia 11/24/2015   Hypertension    IBS (irritable bowel syndrome)    Joint pain    Neuropathy    OAB (overactive bladder)    Obesity    Osteoarthritis    Osteoporosis    Pre-diabetes    Rheumatoid arthritis (HCC) 11/24/2015   Sciatica    Seasonal allergies    Spinal stenosis    Past Surgical History:  Procedure Laterality Date   ABDOMINAL HYSTERECTOMY  1995   BACK SURGERY     BREAST SURGERY     REDUCTION   CATARACT EXTRACTION W/PHACO  08/01/2012   Procedure: CATARACT EXTRACTION PHACO AND INTRAOCULAR LENS PLACEMENT (IOC);  Surgeon: Chalmers Guest, MD;  Location: [facility] OR;  Service: Ophthalmology;  Laterality: Right;   EYE SURGERY Bilateral    cataract   HERNIA REPAIR     RIGHT ING.   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   Medication monitoring encounter 09/15/2021   Pain of left hip joint 08/10/2020   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   Prediabetes 02/01/2020   Body mass index (BMI) 36.0-36.9, adult 02/01/2020   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   Inflammation of sacroiliac joint (HCC) 10/06/2018   Chronic bronchitis (HCC) 09/25/2018   Mucopurulent chronic bronchitis (HCC) 08/21/2018   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   Spondylosis 06/13/2018   Status post lumbar spinal fusion 06/13/2018   Status post lumbar spine surgery for decompression of spinal cord 06/13/2018   History of total knee replacement, 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   History of total knee arthroplasty, right 09/20/2017   OA (osteoarthritis) of knee 12/26/2015  Essential hypertension 11/24/2015   Hyperlipidemia 11/24/2015   Rheumatoid arthritis (HCC) 11/24/2015   Spinal stenosis of lumbar region 08/24/2014   Lumbar spondylosis 07/21/2014    PCP: Jarrett Soho, PA-C  REFERRING PROVIDER: Marlyne Beards, PA-C   REFERRING DIAG: [phone] (ICD-10-CM) - Other spondylosis with radiculopathy, lumbar region   Rationale for Evaluation and Treatment: Rehabilitation  THERAPY DIAG:  Chronic bilateral low back pain with bilateral sciatica  Difficulty in walking, not elsewhere classified  Muscle weakness (generalized)  Unsteadiness on feet  Abnormal posture  ONSET DATE: Chronic    SUBJECTIVE:                                                                                                                                                                                           SUBJECTIVE STATEMENT  No pain at visit but pain over night. Did some house work and low back is aggravated. Pain radiates down left leg to foot. She forgot her cane.   Pt reports continued problems in her low back and down to L LE and into foot. Pt states this pain becomes very severe. R knee has been replaced but needs a revision. Prednisone has been helping. Pt does note issues with her balance for the last couple years.   PERTINENT HISTORY:  3-4 back surgeries but no rods, RA, osteoporosis  PAIN:  Are you having pain? Yes: NPRS scale: no pain currently because of prednisone, average 7 or 8, 10 at worst Pain location: bilat low back but radiates down to L LE Pain description: sharp Aggravating factors: worst in the morning  Relieving factors: once starts to move it feels better  PRECAUTIONS: Fall  RED FLAGS: None   WEIGHT BEARING RESTRICTIONS: No  FALLS:  Has patient fallen in last 6 months? Yes. Number of falls 1 - went to mailbox, turned to walk up hill and fell  LIVING ENVIRONMENT: Lives with: lives with their family (son and daughter) Lives in: House/apartment Stairs: Yes: Internal: 14 steps; on right going up Has following equipment at home: None  OCCUPATION: Retired; shopping, thinking about going to [employer], loves doing yard work but worried about falls  PLOF: Independent  PATIENT GOALS: Improve balance and back pain  NEXT MD VISIT: n/a  OBJECTIVE:  Note: Objective measures were completed at Evaluation unless otherwise noted.  DIAGNOSTIC FINDINGS:  Lumbar CT in 07/06/22 IMPRESSION: 1. Prior thoracolumbar fusion as detailed above. Unchanged lucency about the T11, L3, and S1 screws associated with absent solid arthrodesis at T11-12, L2-3, and L5-S1. 2. Suspected  chronic severe left and moderate right neural foraminal stenosis at L5-S1. 3. Mild-to-moderate  neural foraminal stenosis at L2-3. 4. Widely patent lumbar spinal canal following decompression. 5. Mild thoracic disc degeneration and advanced upper thoracic facet arthrosis without significant stenosis. 6.  Aortic Atherosclerosis (ICD10-I70.0).  PATIENT SURVEYS:  Modified Oswestry Low Back Pain Disability Questionnaire: 20 / 50 = 40.0 %  COGNITION: Overall cognitive status: Within functional limits for tasks assessed     SENSATION: WFL  MUSCLE LENGTH: Hamstrings: Right (Limited due to prior knee TKA); Left 80 deg deg Thomas test: did not assess  POSTURE: decreased lumbar lordosis, increased thoracic kyphosis, and flexed trunk   PALPATION: TTP bilat lumbar paraspinals, L>R glute max and piriformis  LUMBAR ROM:   AROM eval  Flexion 80%  Extension 10%  Right lateral flexion 1" above knee *  Left lateral flexion 1" above knee *  Right rotation 100%  Left rotation 100%   (Blank rows = not tested)  LOWER EXTREMITY ROM:     Active  Right eval Left eval  Hip flexion    Hip extension    Hip abduction    Hip adduction    Hip internal rotation    Hip external rotation    Knee flexion    Knee extension    Ankle dorsiflexion    Ankle plantarflexion    Ankle inversion    Ankle eversion     (Blank rows = not tested)  LOWER EXTREMITY MMT:    MMT Right eval Left eval  Hip flexion 4 4  Hip extension 3 3  Hip abduction 3+ 3+  Hip adduction    Hip internal rotation    Hip external rotation    Knee flexion 4 4  Knee extension 5 5  Ankle dorsiflexion 4- 4-  Ankle plantarflexion    Ankle inversion    Ankle eversion     (Blank rows = not tested)  LUMBAR SPECIAL TESTS:  Straight leg raise test: Negative, Single leg stance test: Positive, and FABER test: Positive  FUNCTIONAL TESTS:  5 times sit to stand: 15.43 sec Berg Balance Scale: 44/56     GAIT: Distance  walked: Into clinic Assistive device utilized: Single point cane Level of assistance: Modified independence Comments: diminished bilat step length, forward flexed posture, trendelenburg bilat  TREATMENT DATE:  11/27/23 NuStep L4 Sit to stand 2x10 emphasize hip ext and knee valgus Resisted shoulder ext. Red band 2x10 Rows red band 2x10 March In place 2x20 Tandem walking at counter top  Cross over steps   Side steps   11/22/23 See HEP below                                                                                                                                 PATIENT EDUCATION:  Education details: Exam findings, POC, initial HEP Person educated: Patient Education method: Explanation, Demonstration, and Handouts Education comprehension: verbalized understanding, returned demonstration, and needs further education  HOME EXERCISE PROGRAM: Access Code: [email or web address] URL: [email or web address] Date:  11/22/2023 Prepared by: [name] Prey April Kirstie Peri  Exercises - Seated Piriformis Stretch  - 1 x daily - 7 x weekly - 2 sets - 30 sec hold - Seated Hamstring Stretch  - 1 x daily - 7 x weekly - 2 sets - 30 sec hold - Right Standing Lateral Shift Correction at Wall - Hold  - 1 x daily - 7 x weekly - 2 sets - 30 sec hold - Standing Lumbar Extension at Wall - Forearms  - 1 x daily - 7 x weekly - 2 sets - 30 sec hold  ASSESSMENT:  CLINICAL IMPRESSION: Patient is a 82 y.o. F who was seen today for physical therapy  treatment for low back and decreased balance. PMH significant for back surgeries with chronic back pain, R TKA (needing revision), neuropathy, and recent fall. Pt enters with no pain stating the prednisone has really helped. Session consisted of balance and functional activities that's addressed strength balance. Cues to prevent knees bracing against mat with sit to stands. Some UE assist needed at times with balance interventions.  Pt will benefit from PT to  address these issues for improved overall mobility.   OBJECTIVE IMPAIRMENTS: Abnormal gait, decreased balance, decreased coordination, decreased endurance, decreased mobility, difficulty walking, decreased ROM, decreased strength, hypomobility, increased fascial restrictions, increased muscle spasms, impaired flexibility, improper body mechanics, postural dysfunction, and pain.   ACTIVITY LIMITATIONS: lifting, bending, standing, squatting, stairs, transfers, bed mobility, and locomotion level  PARTICIPATION LIMITATIONS: meal prep, cleaning, laundry, shopping, community activity, and yard work  PERSONAL FACTORS: Age, Fitness, Past/current experiences, and Time since onset of injury/illness/exacerbation are also affecting patient's functional outcome.   REHAB POTENTIAL: Good  CLINICAL DECISION MAKING: Evolving/moderate complexity  EVALUATION COMPLEXITY: Moderate   GOALS: Goals reviewed with patient? Yes  SHORT TERM GOALS: Target date: 12/20/2023   Pt will be ind with initial HEP Baseline: Goal status: INITIAL  2.  Pt will demo L = R figure 4 stretch for improved hip flexibility and ROM Baseline:  Goal status: INITIAL  3.  Pt will be able to maintain increased trunk extension with standing and walking posture independently for improved stability Baseline: Trunk flexed ~10-20 deg Goal status: INITIAL    LONG TERM GOALS: Target date: 01/17/2024   Pt will be ind with management and progression of HEP Baseline:  Goal status: INITIAL  2.  Pt will have improved 5x STS to </=13 sec to demo increased functional LE strength and decreased fall risk Baseline: 15.43 sec Goal status: INITIAL  3.  Pt will have improved Berg Balance Score to >/=52 to demo MCID Baseline: 44 Goal status: INITIAL  4.  Pt will have improved modified Oswestry score to </=30% to demo MCID Baseline: 40% Goal status: INITIAL  5.  Pt will report >/=50% improvement in overall pain Baseline:  Goal status:  INITIAL   PLAN:  PT FREQUENCY: 2x/week  PT DURATION: 8 weeks  PLANNED INTERVENTIONS: [id number]- PT Re-evaluation, 97110-Therapeutic exercises, 97530- Therapeutic activity, 97112- Neuromuscular re-education, 97535- Self Care, 16109- Manual therapy, [phone]- Gait training, [phone]- Aquatic Therapy, [phone]- Electrical stimulation (unattended), [phone]- Ionotophoresis 4mg /ml Dexamethasone, Patient/Family education, Balance training, Stair training, Taping, Dry Needling, Joint mobilization, Spinal mobilization, Cryotherapy, and Moist heat.  PLAN FOR NEXT SESSION: Assess response to HEP. Continue to improve hip/lumbar mobility as tolerated. Initiate core and hip strengthening. Work on improving postural stability and balance.    Grayce Sessions, PTA 11/27/2023, 10:14 AM

## 2023-11-28 ENCOUNTER — Ambulatory Visit: Admitting: Physical Therapy

## 2023-12-03 DIAGNOSIS — M069 Rheumatoid arthritis, unspecified: Secondary | ICD-10-CM | POA: Diagnosis not present

## 2023-12-03 DIAGNOSIS — K219 Gastro-esophageal reflux disease without esophagitis: Secondary | ICD-10-CM | POA: Diagnosis not present

## 2023-12-03 DIAGNOSIS — Z6826 Body mass index (BMI) 26.0-26.9, adult: Secondary | ICD-10-CM | POA: Diagnosis not present

## 2023-12-03 DIAGNOSIS — R7303 Prediabetes: Secondary | ICD-10-CM | POA: Diagnosis not present

## 2023-12-03 DIAGNOSIS — F419 Anxiety disorder, unspecified: Secondary | ICD-10-CM | POA: Diagnosis not present

## 2023-12-03 DIAGNOSIS — I10 Essential (primary) hypertension: Secondary | ICD-10-CM | POA: Diagnosis not present

## 2023-12-03 DIAGNOSIS — Z111 Encounter for screening for respiratory tuberculosis: Secondary | ICD-10-CM | POA: Diagnosis not present

## 2023-12-03 DIAGNOSIS — M0589 Other rheumatoid arthritis with rheumatoid factor of multiple sites: Secondary | ICD-10-CM | POA: Diagnosis not present

## 2023-12-03 DIAGNOSIS — E7800 Pure hypercholesterolemia, unspecified: Secondary | ICD-10-CM | POA: Diagnosis not present

## 2023-12-03 DIAGNOSIS — I700 Atherosclerosis of aorta: Secondary | ICD-10-CM | POA: Diagnosis not present

## 2023-12-03 DIAGNOSIS — M4800 Spinal stenosis, site unspecified: Secondary | ICD-10-CM | POA: Diagnosis not present

## 2023-12-04 ENCOUNTER — Encounter: Payer: Self-pay | Admitting: Physical Therapy

## 2023-12-04 ENCOUNTER — Ambulatory Visit: Admitting: Physical Therapy

## 2023-12-04 DIAGNOSIS — R262 Difficulty in walking, not elsewhere classified: Secondary | ICD-10-CM

## 2023-12-04 DIAGNOSIS — M5441 Lumbago with sciatica, right side: Secondary | ICD-10-CM | POA: Diagnosis not present

## 2023-12-04 DIAGNOSIS — M6281 Muscle weakness (generalized): Secondary | ICD-10-CM

## 2023-12-04 DIAGNOSIS — G8929 Other chronic pain: Secondary | ICD-10-CM

## 2023-12-04 DIAGNOSIS — R2681 Unsteadiness on feet: Secondary | ICD-10-CM | POA: Diagnosis not present

## 2023-12-04 DIAGNOSIS — M5442 Lumbago with sciatica, left side: Secondary | ICD-10-CM | POA: Diagnosis not present

## 2023-12-04 DIAGNOSIS — R293 Abnormal posture: Secondary | ICD-10-CM

## 2023-12-04 NOTE — Therapy (Signed)
 OUTPATIENT PHYSICAL THERAPY THORACOLUMBAR TREATMENT   Patient Name: Joy [name] MRN: [id number] DOB:Feb 14, 1941, 83 y.o., female Today's Date: 12/04/2023  END OF SESSION:  PT End of Session - 12/04/23 1508     Visit Number 3    Date for PT Re-Evaluation 01/17/24    PT Start Time 1510    PT Stop Time 1555    PT Time Calculation (min) 45 min    Activity Tolerance Patient tolerated treatment well    Behavior During Therapy [facility] for tasks assessed/performed             Past Medical History:  Diagnosis Date   Ankylosing spondylitis (HCC)    Anxiety    Arthritis    RHEUMATOID   Back pain    Bronchitis    Constipation    COPD (chronic obstructive pulmonary disease) (HCC)    CXR 01/11/18 showed mild COPD and chronic bronchitis   CTS (carpal tunnel syndrome)    Dry eye    Dry mouth    Dysrhythmia    "irregularity" unknown at this time - being evaluated by cardiology   Essential hypertension 11/24/2015   GERD (gastroesophageal reflux disease)    HLA B27 (HLA B27 positive)    Hypercholesteremia    Hyperlipidemia 11/24/2015   Hypertension    IBS (irritable bowel syndrome)    Joint pain    Neuropathy    OAB (overactive bladder)    Obesity    Osteoarthritis    Osteoporosis    Pre-diabetes    Rheumatoid arthritis (HCC) 11/24/2015   Sciatica    Seasonal allergies    Spinal stenosis    Past Surgical History:  Procedure Laterality Date   ABDOMINAL HYSTERECTOMY  1995   BACK SURGERY     BREAST SURGERY     REDUCTION   CATARACT EXTRACTION W/PHACO  08/01/2012   Procedure: CATARACT EXTRACTION PHACO AND INTRAOCULAR LENS PLACEMENT (IOC);  Surgeon: Chalmers Guest, MD;  Location: [facility] OR;  Service: Ophthalmology;  Laterality: Right;   EYE SURGERY Bilateral    cataract   HERNIA REPAIR     RIGHT ING.   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   Medication monitoring encounter 09/15/2021   Pain of left hip joint 08/10/2020   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   Prediabetes 02/01/2020   Body mass index (BMI) 36.0-36.9, adult 02/01/2020   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   Inflammation of sacroiliac joint (HCC) 10/06/2018   Chronic bronchitis (HCC) 09/25/2018   Mucopurulent chronic bronchitis (HCC) 08/21/2018   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   Spondylosis 06/13/2018   Status post lumbar spinal fusion 06/13/2018   Status post lumbar spine surgery for decompression of spinal cord 06/13/2018   History of total knee replacement, 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   History of total knee arthroplasty, right 09/20/2017   OA (osteoarthritis) of knee 12/26/2015  Essential hypertension 11/24/2015   Hyperlipidemia 11/24/2015   Rheumatoid arthritis (HCC) 11/24/2015   Spinal stenosis of lumbar region 08/24/2014   Lumbar spondylosis 07/21/2014    PCP: Darnelle Elders, PA-C  REFERRING PROVIDER: Esequiel Hector, PA-C   REFERRING DIAG: [phone] (ICD-10-CM) - Other spondylosis with radiculopathy, lumbar region   Rationale for Evaluation and Treatment: Rehabilitation  THERAPY DIAG:  Chronic bilateral low back pain with bilateral sciatica  Difficulty in walking, not elsewhere classified  Muscle weakness (generalized)  Unsteadiness on feet  Abnormal posture  ONSET DATE: Chronic    SUBJECTIVE:                                                                                                                                                                                           SUBJECTIVE STATEMENT Pt reports having  little headache   PERTINENT HISTORY:  3-4 back surgeries but no rods, RA, osteoporosis  PAIN:  Are you having pain? Yes: NPRS scale: 3/10 Pain location: R side of head  Pain description: sharp Aggravating factors: worst in the morning  Relieving factors: once starts to move it feels better  PRECAUTIONS: Fall  RED FLAGS: None   WEIGHT BEARING RESTRICTIONS: No  FALLS:  Has patient fallen in last 6 months? Yes. Number of falls 1 - went to mailbox, turned to walk up hill and fell  LIVING ENVIRONMENT: Lives with: lives with their family (son and daughter) Lives in: House/apartment Stairs: Yes: Internal: 14 steps; on right going up Has following equipment at home: None  OCCUPATION: Retired; shopping, thinking about going to [employer], loves doing yard work but worried about falls  PLOF: Independent  PATIENT GOALS: Improve balance and back pain  NEXT MD VISIT: n/a  OBJECTIVE:  Note: Objective measures were completed at Evaluation unless otherwise noted.  DIAGNOSTIC FINDINGS:  Lumbar CT in 07/06/22 IMPRESSION: 1. Prior thoracolumbar fusion as detailed above. Unchanged lucency about the T11, L3, and S1 screws associated with absent solid arthrodesis at T11-12, L2-3, and L5-S1. 2. Suspected chronic severe left and moderate right neural foraminal stenosis at L5-S1. 3. Mild-to-moderate neural foraminal stenosis at L2-3. 4. Widely patent lumbar spinal canal following decompression. 5. Mild thoracic disc degeneration and advanced upper thoracic facet arthrosis without significant stenosis. 6.  Aortic Atherosclerosis (ICD10-I70.0).  PATIENT SURVEYS:  Modified Oswestry Low Back Pain Disability Questionnaire: 20 / 50 = 40.0  %  COGNITION: Overall cognitive status: Within functional limits for tasks assessed     SENSATION: WFL  MUSCLE LENGTH: Hamstrings: Right (Limited due to prior knee TKA); Left 80 deg deg Thomas test: did not assess  POSTURE: decreased  lumbar lordosis, increased thoracic kyphosis, and flexed trunk   PALPATION: TTP bilat lumbar paraspinals, L>R glute max and piriformis  LUMBAR ROM:   AROM eval  Flexion 80%  Extension 10%  Right lateral flexion 1" above knee *  Left lateral flexion 1" above knee *  Right rotation 100%  Left rotation 100%   (Blank rows = not tested)  LOWER EXTREMITY ROM:     Active  Right eval Left eval  Hip flexion    Hip extension    Hip abduction    Hip adduction    Hip internal rotation    Hip external rotation    Knee flexion    Knee extension    Ankle dorsiflexion    Ankle plantarflexion    Ankle inversion    Ankle eversion     (Blank rows = not tested)  LOWER EXTREMITY MMT:    MMT Right eval Left eval  Hip flexion 4 4  Hip extension 3 3  Hip abduction 3+ 3+  Hip adduction    Hip internal rotation    Hip external rotation    Knee flexion 4 4  Knee extension 5 5  Ankle dorsiflexion 4- 4-  Ankle plantarflexion    Ankle inversion    Ankle eversion     (Blank rows = not tested)  LUMBAR SPECIAL TESTS:  Straight leg raise test: Negative, Single leg stance test: Positive, and FABER test: Positive  FUNCTIONAL TESTS:  5 times sit to stand: 15.43 sec Berg Balance Scale: 44/56     GAIT: Distance walked: Into clinic Assistive device utilized: Single point cane Level of assistance: Modified independence Comments: diminished bilat step length, forward flexed posture, trendelenburg bilat  TREATMENT DATE:  12/04/23 NuStep L5 6in step up  x10 each- narrow BOS, uses UE when stepping with LLE, decreased step length 4in lateral step up x10 each  Blue band  Row 2x15 knee flex 2 x10 each LAQ x10 3#, each leg Bridges 2x10 HS  stretch PROM hip flex Supine trunk rotations  11/27/23 NuStep L4 Sit to stand 2x10 emphasize hip ext and knee valgus Resisted shoulder ext. Red band 2x10 Rows red band 2x10 March In place 2x20 Tandem walking at counter top  Cross over steps   Side steps   11/22/23 See HEP below                                                                                                                                 PATIENT EDUCATION:  Education details: Exam findings, POC, initial HEP Person educated: Patient Education method: Explanation, Demonstration, and Handouts Education comprehension: verbalized understanding, returned demonstration, and needs further education  HOME EXERCISE PROGRAM: Access Code: [email or web address] URL: [email or web address] Date: 11/22/2023 Prepared by: Gellen April Erman Hayward  Exercises - Seated Piriformis Stretch  - 1 x daily - 7 x weekly - 2 sets - 30 sec hold - Seated Hamstring Stretch  -  1 x daily - 7 x weekly - 2 sets - 30 sec hold - Right Standing Lateral Shift Correction at Wall - Hold  - 1 x daily - 7 x weekly - 2 sets - 30 sec hold - Standing Lumbar Extension at Wall - Forearms  - 1 x daily - 7 x weekly - 2 sets - 30 sec hold  ASSESSMENT:  CLINICAL IMPRESSION:s Pt. arrived with a mild headache. She was ambulating with her cane. During the step up exercises, she had more instability when leading with the left foot possibly due to quad weakness. She required cuing to address her narrow BOS, decreased step length, and compensatory use of UE while doing step ups. She was CGA with all step up exercises. She did well with LAQ but is lacking some end range ext in both legs.     OBJECTIVE IMPAIRMENTS: Abnormal gait, decreased balance, decreased coordination, decreased endurance, decreased mobility, difficulty walking, decreased ROM, decreased strength, hypomobility, increased fascial restrictions, increased muscle spasms, impaired flexibility,  improper body mechanics, postural dysfunction, and pain.   ACTIVITY LIMITATIONS: lifting, bending, standing, squatting, stairs, transfers, bed mobility, and locomotion level  PARTICIPATION LIMITATIONS: meal prep, cleaning, laundry, shopping, community activity, and yard work  PERSONAL FACTORS: Age, Fitness, Past/current experiences, and Time since onset of injury/illness/exacerbation are also affecting patient's functional outcome.   REHAB POTENTIAL: Good  CLINICAL DECISION MAKING: Evolving/moderate complexity  EVALUATION COMPLEXITY: Moderate   GOALS: Goals reviewed with patient? Yes  SHORT TERM GOALS: Target date: 12/20/2023   Pt will be ind with initial HEP Baseline: Goal status: INITIAL  2.  Pt will demo L = R figure 4 stretch for improved hip flexibility and ROM Baseline:  Goal status: INITIAL  3.  Pt will be able to maintain increased trunk extension with standing and walking posture independently for improved stability Baseline: Trunk flexed ~10-20 deg Goal status: INITIAL    LONG TERM GOALS: Target date: 01/17/2024   Pt will be ind with management and progression of HEP Baseline:  Goal status: INITIAL  2.  Pt will have improved 5x STS to </=13 sec to demo increased functional LE strength and decreased fall risk Baseline: 15.43 sec Goal status: INITIAL  3.  Pt will have improved Berg Balance Score to >/=52 to demo MCID Baseline: 44 Goal status: INITIAL  4.  Pt will have improved modified Oswestry score to </=30% to demo MCID Baseline: 40% Goal status: INITIAL  5.  Pt will report >/=50% improvement in overall pain Baseline:  Goal status: INITIAL   PLAN:  PT FREQUENCY: 2x/week  PT DURATION: 8 weeks  PLANNED INTERVENTIONS: 97164- PT Re-evaluation, 97110-Therapeutic exercises, 97530- Therapeutic activity, 97112- Neuromuscular re-education, 97535- Self Care, 40981- Manual therapy, [phone]- Gait training, [phone]- Aquatic Therapy, [phone]- Electrical  stimulation (unattended), [phone]- Ionotophoresis 4mg /ml Dexamethasone, Patient/Family education, Balance training, Stair training, Taping, Dry Needling, Joint mobilization, Spinal mobilization, Cryotherapy, and Moist heat.  PLAN FOR NEXT SESSION: Assess response to HEP. Continue to improve hip/lumbar mobility as tolerated. Initiate core and hip strengthening. Work on improving postural stability and balance.    Ollen Beverage, PTA 12/04/2023, 3:10 PM

## 2023-12-09 ENCOUNTER — Ambulatory Visit: Admitting: Physical Therapy

## 2023-12-09 ENCOUNTER — Encounter: Payer: Self-pay | Admitting: Physical Therapy

## 2023-12-09 DIAGNOSIS — R262 Difficulty in walking, not elsewhere classified: Secondary | ICD-10-CM | POA: Diagnosis not present

## 2023-12-09 DIAGNOSIS — R2681 Unsteadiness on feet: Secondary | ICD-10-CM

## 2023-12-09 DIAGNOSIS — G8929 Other chronic pain: Secondary | ICD-10-CM | POA: Diagnosis not present

## 2023-12-09 DIAGNOSIS — R293 Abnormal posture: Secondary | ICD-10-CM

## 2023-12-09 DIAGNOSIS — M5441 Lumbago with sciatica, right side: Secondary | ICD-10-CM | POA: Diagnosis not present

## 2023-12-09 DIAGNOSIS — M5442 Lumbago with sciatica, left side: Secondary | ICD-10-CM | POA: Diagnosis not present

## 2023-12-09 DIAGNOSIS — M6281 Muscle weakness (generalized): Secondary | ICD-10-CM

## 2023-12-09 NOTE — Therapy (Signed)
 OUTPATIENT PHYSICAL THERAPY THORACOLUMBAR TREATMENT   Patient Name: Joy [name] MRN: [id number] DOB:01-25-1941, 83 y.o., female Today's Date: 12/09/2023  END OF SESSION:  PT End of Session - 12/09/23 1513     Visit Number 4    Date for PT Re-Evaluation 01/17/24    Authorization Type Medicare    Progress Note Due on Visit 10    PT Start Time 1515    PT Stop Time 1600    PT Time Calculation (min) 45 min             Past Medical History:  Diagnosis Date   Ankylosing spondylitis (HCC)    Anxiety    Arthritis    RHEUMATOID   Back pain    Bronchitis    Constipation    COPD (chronic obstructive pulmonary disease) (HCC)    CXR 01/11/18 showed mild COPD and chronic bronchitis   CTS (carpal tunnel syndrome)    Dry eye    Dry mouth    Dysrhythmia    "irregularity" unknown at this time - being evaluated by cardiology   Essential hypertension 11/24/2015   GERD (gastroesophageal reflux disease)    HLA B27 (HLA B27 positive)    Hypercholesteremia    Hyperlipidemia 11/24/2015   Hypertension    IBS (irritable bowel syndrome)    Joint pain    Neuropathy    OAB (overactive bladder)    Obesity    Osteoarthritis    Osteoporosis    Pre-diabetes    Rheumatoid arthritis (HCC) 11/24/2015   Sciatica    Seasonal allergies    Spinal stenosis    Past Surgical History:  Procedure Laterality Date   ABDOMINAL HYSTERECTOMY  1995   BACK SURGERY     BREAST SURGERY     REDUCTION   CATARACT EXTRACTION W/PHACO  08/01/2012   Procedure: CATARACT EXTRACTION PHACO AND INTRAOCULAR LENS PLACEMENT (IOC);  Surgeon: Ben Bracken, MD;  Location: [facility] OR;  Service: Ophthalmology;  Laterality: Right;   EYE SURGERY Bilateral    cataract   HERNIA REPAIR     RIGHT ING.   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   Medication monitoring encounter 09/15/2021   Pain of left hip joint 08/10/2020   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   Prediabetes 02/01/2020   Body mass index (BMI) 36.0-36.9, adult 02/01/2020   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   Inflammation of sacroiliac joint (HCC) 10/06/2018   Chronic bronchitis (HCC) 09/25/2018   Mucopurulent chronic bronchitis (HCC) 08/21/2018   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   Spondylosis 06/13/2018   Status post lumbar spinal fusion 06/13/2018   Status post lumbar spine surgery for decompression of spinal cord 06/13/2018   History of total knee replacement, 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   History of total knee arthroplasty, right 09/20/2017   OA (osteoarthritis) of knee 12/26/2015   Essential hypertension 11/24/2015  Hyperlipidemia 11/24/2015   Rheumatoid arthritis (HCC) 11/24/2015   Spinal stenosis of lumbar region 08/24/2014   Lumbar spondylosis 07/21/2014    PCP: Darnelle Elders, PA-C  REFERRING PROVIDER: Esequiel Hector, PA-C   REFERRING DIAG: [phone] (ICD-10-CM) - Other spondylosis with radiculopathy, lumbar region   Rationale for Evaluation and Treatment: Rehabilitation  THERAPY DIAG:  Chronic bilateral low back pain with bilateral sciatica  Difficulty in walking, not elsewhere classified  Muscle weakness (generalized)  Unsteadiness on feet  Abnormal posture  ONSET DATE: Chronic   SUBJECTIVE:                                                                                                                                                                                            SUBJECTIVE STATEMENT "I feel good. My legs hurt a little bit but other than that I'm ok". She went un and down the stairs multiple times today.   PERTINENT HISTORY:  3-4 back surgeries but no rods, RA, osteoporosis  PAIN:  Are you having pain? Yes: NPRS scale: 3/10 Pain location: B legs Pain description: aching Aggravating factors: Inconsistent  Relieving factors: once starts to move it feels better  PRECAUTIONS: Fall  RED FLAGS: None   WEIGHT BEARING RESTRICTIONS: No  FALLS:  Has patient fallen in last 6 months? Yes. Number of falls 1 - went to mailbox, turned to walk up hill and fell  LIVING ENVIRONMENT: Lives with: lives with their family (son and daughter) Lives in: House/apartment Stairs: Yes: Internal: 14 steps; on right going up Has following equipment at home: None  OCCUPATION: Retired; shopping, thinking about going to [employer], loves doing yard work but worried about falls  PLOF: Independent  PATIENT GOALS: Improve balance and back pain  NEXT MD VISIT: n/a  OBJECTIVE:  Note: Objective measures were completed at Evaluation unless otherwise noted.  DIAGNOSTIC FINDINGS:  Lumbar CT in 07/06/22 IMPRESSION: 1. Prior thoracolumbar fusion as detailed above. Unchanged lucency about the T11, L3, and S1 screws associated with absent solid arthrodesis at T11-12, L2-3, and L5-S1. 2. Suspected chronic severe left and moderate right neural foraminal stenosis at L5-S1. 3. Mild-to-moderate neural foraminal stenosis at L2-3. 4. Widely patent lumbar spinal canal following decompression. 5. Mild thoracic disc degeneration and advanced upper thoracic facet arthrosis without significant stenosis. 6.  Aortic Atherosclerosis (ICD10-I70.0).  PATIENT SURVEYS:  Modified Oswestry Low Back Pain Disability Questionnaire: 20 / 50 = 40.0  %  COGNITION: Overall cognitive status: Within functional limits for tasks assessed     SENSATION: WFL  MUSCLE LENGTH: Hamstrings: Right (Limited due to prior knee TKA); Left 80 deg deg  Thomas test: did not assess  POSTURE: decreased lumbar lordosis, increased thoracic kyphosis, and flexed trunk   PALPATION: TTP bilat lumbar paraspinals, L>R glute max and piriformis  LUMBAR ROM:   AROM eval  Flexion 80%  Extension 10%  Right lateral flexion 1" above knee *  Left lateral flexion 1" above knee *  Right rotation 100%  Left rotation 100%   (Blank rows = not tested)  LOWER EXTREMITY ROM:     Active  Right eval Left eval  Hip flexion    Hip extension    Hip abduction    Hip adduction    Hip internal rotation    Hip external rotation    Knee flexion    Knee extension    Ankle dorsiflexion    Ankle plantarflexion    Ankle inversion    Ankle eversion     (Blank rows = not tested)  LOWER EXTREMITY MMT:    MMT Right eval Left eval  Hip flexion 4 4  Hip extension 3 3  Hip abduction 3+ 3+  Hip adduction    Hip internal rotation    Hip external rotation    Knee flexion 4 4  Knee extension 5 5  Ankle dorsiflexion 4- 4-  Ankle plantarflexion    Ankle inversion    Ankle eversion     (Blank rows = not tested)  LUMBAR SPECIAL TESTS:  Straight leg raise test: Negative, Single leg stance test: Positive, and FABER test: Positive  FUNCTIONAL TESTS:  5 times sit to stand: 15.43 sec Berg Balance Scale: 44/56     GAIT: Distance walked: Into clinic Assistive device utilized: Single point cane Level of assistance: Modified independence Comments: diminished bilat step length, forward flexed posture, trendelenburg bilat  TREATMENT DATE:  12/09/23 Nustep L5 S2S x10- cue to avoid knees on the table Blue band HS curl x10 each leg 3# LAQ x10 each- R knee had some pain Blue band Rows 2x10     Bars  March in place on airex pad  Walk forward/backwards on  airex mats 3x9ft  One foot on airex, on foot on 6in box, head swivels x20sec each leg 6in step upsx10 each leg HS, ITB stretch B legs       12/04/23 NuStep L5 6in step up  x10 each- narrow BOS, uses UE when stepping with LLE, decreased step length 4in lateral step up x10 each  Blue band  Row 2x15 knee flex 2 x10 each LAQ x10 3#, each leg Bridges 2x10 HS stretch PROM hip flex Supine trunk rotations  11/27/23 NuStep L4 Sit to stand 2x10 emphasize hip ext and knee valgus Resisted shoulder ext. Red band 2x10 Rows red band 2x10 March In place 2x20 Tandem walking at counter top  Cross over steps   Side steps   11/22/23 See HEP below  PATIENT EDUCATION:  Education details: Exam findings, POC, initial HEP Person educated: Patient Education method: Explanation, Demonstration, and Handouts Education comprehension: verbalized understanding, returned demonstration, and needs further education  HOME EXERCISE PROGRAM: Access Code: [email or web address] URL: [email or web address] Date: 11/22/2023 Prepared by: Gellen April Erman Hayward  Exercises - Seated Piriformis Stretch  - 1 x daily - 7 x weekly - 2 sets - 30 sec hold - Seated Hamstring Stretch  - 1 x daily - 7 x weekly - 2 sets - 30 sec hold - Right Standing Lateral Shift Correction at Wall - Hold  - 1 x daily - 7 x weekly - 2 sets - 30 sec hold - Standing Lumbar Extension at Wall - Forearms  - 1 x daily - 7 x weekly - 2 sets - 30 sec hold  ASSESSMENT:  CLINICAL IMPRESSION: She came in ambulating without her cane. She reported some general pain in her legs that comes and goes. She did well with S2S, but needed cuing to keep a wide BOS and avoid pressing her knees on the table.Her LEs are visibly getting stronger.  She had a lot of instability with activities in the bars. She needed  constant reminders throughout the session to maintain her wide BOS. During step ups, she complained of sciatic pain in her Rt leg. When stretching, she had tight ITB that was tender to the touch.     OBJECTIVE IMPAIRMENTS: Abnormal gait, decreased balance, decreased coordination, decreased endurance, decreased mobility, difficulty walking, decreased ROM, decreased strength, hypomobility, increased fascial restrictions, increased muscle spasms, impaired flexibility, improper body mechanics, postural dysfunction, and pain.   ACTIVITY LIMITATIONS: lifting, bending, standing, squatting, stairs, transfers, bed mobility, and locomotion level  PARTICIPATION LIMITATIONS: meal prep, cleaning, laundry, shopping, community activity, and yard work  PERSONAL FACTORS: Age, Fitness, Past/current experiences, and Time since onset of injury/illness/exacerbation are also affecting patient's functional outcome.   REHAB POTENTIAL: Good  CLINICAL DECISION MAKING: Evolving/moderate complexity  EVALUATION COMPLEXITY: Moderate   GOALS: Goals reviewed with patient? Yes  SHORT TERM GOALS: Target date: 12/20/2023   Pt will be ind with initial HEP Baseline: Goal status: INITIAL  2.  Pt will demo L = R figure 4 stretch for improved hip flexibility and ROM Baseline:  Goal status: INITIAL  3.  Pt will be able to maintain increased trunk extension with standing and walking posture independently for improved stability Baseline: Trunk flexed ~10-20 deg Goal status: INITIAL    LONG TERM GOALS: Target date: 01/17/2024   Pt will be ind with management and progression of HEP Baseline:  Goal status: INITIAL  2.  Pt will have improved 5x STS to </=13 sec to demo increased functional LE strength and decreased fall risk Baseline: 15.43 sec Goal status: INITIAL  3.  Pt will have improved Berg Balance Score to >/=52 to demo MCID Baseline: 44 Goal status: INITIAL  4.  Pt will have improved modified Oswestry  score to </=30% to demo MCID Baseline: 40% Goal status: INITIAL  5.  Pt will report >/=50% improvement in overall pain Baseline:  Goal status: INITIAL   PLAN:  PT FREQUENCY: 2x/week  PT DURATION: 8 weeks  PLANNED INTERVENTIONS: 97164- PT Re-evaluation, 97110-Therapeutic exercises, 97530- Therapeutic activity, 97112- Neuromuscular re-education, 97535- Self Care, 56213- Manual therapy, [phone]- Gait training, [phone]- Aquatic Therapy, [phone]- Electrical stimulation (unattended), [phone]- Ionotophoresis 4mg /ml Dexamethasone , Patient/Family education, Balance training, Stair training, Taping, Dry Needling, Joint mobilization, Spinal mobilization, Cryotherapy, and Moist heat.  PLAN FOR NEXT SESSION: Assess response to HEP.  Continue to improve hip/lumbar mobility as tolerated. Initiate core and hip strengthening. Work on improving postural stability and balance.    Laurelyn Ponder, SPTA 12/09/2023, 3:14 PM

## 2023-12-10 DIAGNOSIS — H43813 Vitreous degeneration, bilateral: Secondary | ICD-10-CM | POA: Diagnosis not present

## 2023-12-10 DIAGNOSIS — H5713 Ocular pain, bilateral: Secondary | ICD-10-CM | POA: Diagnosis not present

## 2023-12-10 DIAGNOSIS — H35373 Puckering of macula, bilateral: Secondary | ICD-10-CM | POA: Diagnosis not present

## 2023-12-10 DIAGNOSIS — H16223 Keratoconjunctivitis sicca, not specified as Sjogren's, bilateral: Secondary | ICD-10-CM | POA: Diagnosis not present

## 2023-12-11 ENCOUNTER — Ambulatory Visit: Admitting: Physical Therapy

## 2023-12-11 ENCOUNTER — Encounter: Payer: Self-pay | Admitting: Physical Therapy

## 2023-12-11 DIAGNOSIS — M5442 Lumbago with sciatica, left side: Secondary | ICD-10-CM | POA: Diagnosis not present

## 2023-12-11 DIAGNOSIS — R2681 Unsteadiness on feet: Secondary | ICD-10-CM

## 2023-12-11 DIAGNOSIS — G8929 Other chronic pain: Secondary | ICD-10-CM

## 2023-12-11 DIAGNOSIS — R262 Difficulty in walking, not elsewhere classified: Secondary | ICD-10-CM

## 2023-12-11 DIAGNOSIS — M6281 Muscle weakness (generalized): Secondary | ICD-10-CM | POA: Diagnosis not present

## 2023-12-11 DIAGNOSIS — M5441 Lumbago with sciatica, right side: Secondary | ICD-10-CM | POA: Diagnosis not present

## 2023-12-11 NOTE — Therapy (Signed)
 OUTPATIENT PHYSICAL THERAPY THORACOLUMBAR TREATMENT   Patient Name: Joy [name] MRN: [id number] DOB:Jan 24, 1941, 83 y.o., female Today's Date: 12/11/2023  END OF SESSION:  PT End of Session - 12/11/23 1103     Visit Number 5    Date for PT Re-Evaluation 01/17/24    Authorization Type Medicare    PT Start Time 1100    PT Stop Time 1145    PT Time Calculation (min) 45 min             Past Medical History:  Diagnosis Date   Ankylosing spondylitis (HCC)    Anxiety    Arthritis    RHEUMATOID   Back pain    Bronchitis    Constipation    COPD (chronic obstructive pulmonary disease) (HCC)    CXR 01/11/18 showed mild COPD and chronic bronchitis   CTS (carpal tunnel syndrome)    Dry eye    Dry mouth    Dysrhythmia    "irregularity" unknown at this time - being evaluated by cardiology   Essential hypertension 11/24/2015   GERD (gastroesophageal reflux disease)    HLA B27 (HLA B27 positive)    Hypercholesteremia    Hyperlipidemia 11/24/2015   Hypertension    IBS (irritable bowel syndrome)    Joint pain    Neuropathy    OAB (overactive bladder)    Obesity    Osteoarthritis    Osteoporosis    Pre-diabetes    Rheumatoid arthritis (HCC) 11/24/2015   Sciatica    Seasonal allergies    Spinal stenosis    Past Surgical History:  Procedure Laterality Date   ABDOMINAL HYSTERECTOMY  1995   BACK SURGERY     BREAST SURGERY     REDUCTION   CATARACT EXTRACTION W/PHACO  08/01/2012   Procedure: CATARACT EXTRACTION PHACO AND INTRAOCULAR LENS PLACEMENT (IOC);  Surgeon: Ben Bracken, MD;  Location: [facility] OR;  Service: Ophthalmology;  Laterality: Right;   EYE SURGERY Bilateral    cataract   HERNIA REPAIR     RIGHT ING.   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   Medication monitoring encounter 09/15/2021   Pain of left hip joint 08/10/2020   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   Prediabetes 02/01/2020   Body mass index (BMI) 36.0-36.9, adult 02/01/2020   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   Inflammation of sacroiliac joint (HCC) 10/06/2018   Chronic bronchitis (HCC) 09/25/2018   Mucopurulent chronic bronchitis (HCC) 08/21/2018   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   Spondylosis 06/13/2018   Status post lumbar spinal fusion 06/13/2018   Status post lumbar spine surgery for decompression of spinal cord 06/13/2018   History of total knee replacement, 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   History of total knee arthroplasty, right 09/20/2017   OA (osteoarthritis) of knee 12/26/2015   Essential hypertension 11/24/2015   Hyperlipidemia 11/24/2015   Rheumatoid arthritis (HCC) 11/24/2015  Spinal stenosis of lumbar region 08/24/2014   Lumbar spondylosis 07/21/2014    PCP: Darnelle Elders, PA-C  REFERRING PROVIDER: Truddie Furrow Merri Abbe, PA-C   REFERRING DIAG: [phone] (ICD-10-CM) - Other spondylosis with radiculopathy, lumbar region   Rationale for Evaluation and Treatment: Rehabilitation  THERAPY DIAG:  Chronic bilateral low back pain with bilateral sciatica  Difficulty in walking, not elsewhere classified  Muscle weakness (generalized)  Unsteadiness on feet  ONSET DATE: Chronic   SUBJECTIVE:                                                                                                                                                                                            SUBJECTIVE STATEMENT " I feel good today but me knees hurt a little bit."   PERTINENT HISTORY:  3-4 back surgeries but no rods, RA, osteoporosis  PAIN:  Are you having pain? Yes: NPRS scale: 3/10  Pain location: B legs Pain description: aching Aggravating factors: Inconsistent  Relieving factors: once starts to move it feels better  PRECAUTIONS: Fall  RED FLAGS: None   WEIGHT BEARING RESTRICTIONS: No  FALLS:  Has patient fallen in last 6 months? Yes. Number of falls 1 - went to mailbox, turned to walk up hill and fell  LIVING ENVIRONMENT: Lives with: lives with their family (son and daughter) Lives in: House/apartment Stairs: Yes: Internal: 14 steps; on right going up Has following equipment at home: None  OCCUPATION: Retired; shopping, thinking about going to [employer], loves doing yard work but worried about falls  PLOF: Independent  PATIENT GOALS: Improve balance and back pain  NEXT MD VISIT: n/a  OBJECTIVE:  Note: Objective measures were completed at Evaluation unless otherwise noted.  DIAGNOSTIC FINDINGS:  Lumbar CT in 07/06/22 IMPRESSION: 1. Prior thoracolumbar fusion as detailed above. Unchanged lucency about the T11, L3, and S1 screws associated with absent solid arthrodesis at T11-12, L2-3, and L5-S1. 2. Suspected chronic severe left and moderate right neural foraminal stenosis at L5-S1. 3. Mild-to-moderate neural foraminal stenosis at L2-3. 4. Widely patent lumbar spinal canal following decompression. 5. Mild thoracic disc degeneration and advanced upper thoracic facet arthrosis without significant stenosis. 6.  Aortic Atherosclerosis (ICD10-I70.0).  PATIENT SURVEYS:  Modified Oswestry Low Back Pain Disability Questionnaire: 20 / 50 = 40.0 %  COGNITION: Overall cognitive status: Within functional limits for tasks assessed     SENSATION: WFL  MUSCLE  LENGTH: Hamstrings: Right (Limited due to prior knee TKA); Left 80 deg deg Thomas test: did not assess  POSTURE: decreased lumbar lordosis, increased thoracic kyphosis, and flexed trunk   PALPATION: TTP bilat lumbar paraspinals, L>R glute  max and piriformis  LUMBAR ROM:   AROM eval  Flexion 80%  Extension 10%  Right lateral flexion 1" above knee *  Left lateral flexion 1" above knee *  Right rotation 100%  Left rotation 100%   (Blank rows = not tested)  LOWER EXTREMITY ROM:     Active  Right eval Left eval  Hip flexion    Hip extension    Hip abduction    Hip adduction    Hip internal rotation    Hip external rotation    Knee flexion    Knee extension    Ankle dorsiflexion    Ankle plantarflexion    Ankle inversion    Ankle eversion     (Blank rows = not tested)  LOWER EXTREMITY MMT:    MMT Right eval Left eval  Hip flexion 4 4  Hip extension 3 3  Hip abduction 3+ 3+  Hip adduction    Hip internal rotation    Hip external rotation    Knee flexion 4 4  Knee extension 5 5  Ankle dorsiflexion 4- 4-  Ankle plantarflexion    Ankle inversion    Ankle eversion     (Blank rows = not tested)  LUMBAR SPECIAL TESTS:  Straight leg raise test: Negative, Single leg stance test: Positive, and FABER test: Positive  FUNCTIONAL TESTS:  5 times sit to stand: 15.43 sec Berg Balance Scale: 44/56     GAIT: Distance walked: Into clinic Assistive device utilized: Single point cane Level of assistance: Modified independence Comments: diminished bilat step length, forward flexed posture, trendelenburg bilat  TREATMENT DATE: 12/11/23 Nustep L5  S2S x10,  6in step up x10 each 4in lateral step x10 each Resisted gait 10# forward x10, lateral x5 each way Red band row 2x12 Yellow ball chest press x10, OH press x10 Walk outside w/o AD 537ft    12/09/23 Nustep L5 S2S x10- cue to avoid knees on the table Blue band HS curl x10 each leg 3# LAQ x10 each- R  knee had some pain Blue band Rows 2x10     Bars  March in place on airex pad  Walk forward/backwards on airex mats 3x60ft  One foot on airex, on foot on 6in box, head swivels x20sec each leg 6in step upsx10 each leg HS, ITB stretch B legs       12/04/23 NuStep L5 6in step up  x10 each- narrow BOS, uses UE when stepping with LLE, decreased step length 4in lateral step up x10 each  Blue band  Row 2x15 knee flex 2 x10 each LAQ x10 3#, each leg Bridges 2x10 HS stretch PROM hip flex Supine trunk rotations  11/27/23 NuStep L4 Sit to stand 2x10 emphasize hip ext and knee valgus Resisted shoulder ext. Red band 2x10 Rows red band 2x10 March In place 2x20 Tandem walking at counter top  Cross over steps   Side steps   11/22/23 See HEP below  PATIENT EDUCATION:  Education details: Exam findings, POC, initial HEP Person educated: Patient Education method: Explanation, Demonstration, and Handouts Education comprehension: verbalized understanding, returned demonstration, and needs further education  HOME EXERCISE PROGRAM: Access Code: [email or web address] URL: [email or web address] Date: 11/22/2023 Prepared by: Gellen April Erman [place]  Exercises - Seated Piriformis Stretch  - 1 x daily - 7 x weekly - 2 sets - 30 sec hold - Seated Hamstring Stretch  - 1 x daily - 7 x weekly - 2 sets - 30 sec hold - Right Standing Lateral Shift Correction at Wall - Hold  - 1 x daily - 7 x weekly - 2 sets - 30 sec hold - Standing Lumbar Extension at Wall - Forearms  - 1 x daily - 7 x weekly - 2 sets - 30 sec hold  ASSESSMENT:  CLINICAL IMPRESSION: She arrived ambulating with her cane. She tolerated all exercises well,but her knees were bothering her more than usual. She needed some cuing to keep a wide BOS, but was able to self correct a few times  throughout the session. She was CGA with all step up activities because of instability, possibly due to her knee pain. When walking outside she SBA and needed cues to pick up her feet to decrease fall risk.   OBJECTIVE IMPAIRMENTS: Abnormal gait, decreased balance, decreased coordination, decreased endurance, decreased mobility, difficulty walking, decreased ROM, decreased strength, hypomobility, increased fascial restrictions, increased muscle spasms, impaired flexibility, improper body mechanics, postural dysfunction, and pain.   ACTIVITY LIMITATIONS: lifting, bending, standing, squatting, stairs, transfers, bed mobility, and locomotion level  PARTICIPATION LIMITATIONS: meal prep, cleaning, laundry, shopping, community activity, and yard work  PERSONAL FACTORS: Age, Fitness, Past/current experiences, and Time since onset of injury/illness/exacerbation are also affecting patient's functional outcome.   REHAB POTENTIAL: Good  CLINICAL DECISION MAKING: Evolving/moderate complexity  EVALUATION COMPLEXITY: Moderate   GOALS: Goals reviewed with patient? Yes  SHORT TERM GOALS: Target date: 12/20/2023   Pt will be ind with initial HEP Baseline: Goal status: INITIAL  2.  Pt will demo L = R figure 4 stretch for improved hip flexibility and ROM Baseline:  Goal status: INITIAL  3.  Pt will be able to maintain increased trunk extension with standing and walking posture independently for improved stability Baseline: Trunk flexed ~10-20 deg Goal status: INITIAL    LONG TERM GOALS: Target date: 01/17/2024   Pt will be ind with management and progression of HEP Baseline:  Goal status: INITIAL  2.  Pt will have improved 5x STS to </=13 sec to demo increased functional LE strength and decreased fall risk Baseline: 15.43 sec Goal status: INITIAL  3.  Pt will have improved Berg Balance Score to >/=52 to demo MCID Baseline: 44 Goal status: INITIAL  4.  Pt will have improved modified  Oswestry score to </=30% to demo MCID Baseline: 40% Goal status: INITIAL  5.  Pt will report >/=50% improvement in overall pain Baseline:  Goal status: INITIAL   PLAN:  PT FREQUENCY: 2x/week  PT DURATION: 8 weeks  PLANNED INTERVENTIONS: [id number]- PT Re-evaluation, 97110-Therapeutic exercises, 97530- Therapeutic activity, 97112- Neuromuscular re-education, 97535- Self Care, 54098- Manual therapy, [phone]- Gait training, [phone]- Aquatic Therapy, [phone]- Electrical stimulation (unattended), [phone]- Ionotophoresis 4mg /ml Dexamethasone , Patient/Family education, Balance training, Stair training, Taping, Dry Needling, Joint mobilization, Spinal mobilization, Cryotherapy, and Moist heat.  PLAN FOR NEXT SESSION: Assess response to HEP. Continue to improve hip/lumbar mobility as tolerated. Initiate core and hip strengthening. Work on improving postural stability and balance.  Laurelyn Ponder, SPTA 12/11/2023, 11:03 AM

## 2023-12-16 ENCOUNTER — Encounter: Payer: Self-pay | Admitting: Physical Therapy

## 2023-12-16 ENCOUNTER — Ambulatory Visit: Admitting: Physical Therapy

## 2023-12-16 DIAGNOSIS — R279 Unspecified lack of coordination: Secondary | ICD-10-CM

## 2023-12-16 DIAGNOSIS — R293 Abnormal posture: Secondary | ICD-10-CM

## 2023-12-16 DIAGNOSIS — G8929 Other chronic pain: Secondary | ICD-10-CM

## 2023-12-16 DIAGNOSIS — R262 Difficulty in walking, not elsewhere classified: Secondary | ICD-10-CM | POA: Diagnosis not present

## 2023-12-16 DIAGNOSIS — R2681 Unsteadiness on feet: Secondary | ICD-10-CM

## 2023-12-16 DIAGNOSIS — M5442 Lumbago with sciatica, left side: Secondary | ICD-10-CM | POA: Diagnosis not present

## 2023-12-16 DIAGNOSIS — M6281 Muscle weakness (generalized): Secondary | ICD-10-CM | POA: Diagnosis not present

## 2023-12-16 DIAGNOSIS — M5441 Lumbago with sciatica, right side: Secondary | ICD-10-CM | POA: Diagnosis not present

## 2023-12-16 NOTE — Therapy (Signed)
 OUTPATIENT PHYSICAL THERAPY THORACOLUMBAR TREATMENT   Patient Name: Joy [name] MRN: [id number] DOB:1940-11-02, 83 y.o., female Today's Date: 12/16/2023  END OF SESSION:  PT End of Session - 12/16/23 1105     Visit Number 6    Date for PT Re-Evaluation 01/17/24    Authorization Type Medicare    Progress Note Due on Visit 10    PT Start Time 1100    PT Stop Time 1145    PT Time Calculation (min) 45 min             Past Medical History:  Diagnosis Date   Ankylosing spondylitis (HCC)    Anxiety    Arthritis    RHEUMATOID   Back pain    Bronchitis    Constipation    COPD (chronic obstructive pulmonary disease) (HCC)    CXR 01/11/18 showed mild COPD and chronic bronchitis   CTS (carpal tunnel syndrome)    Dry eye    Dry mouth    Dysrhythmia    "irregularity" unknown at this time - being evaluated by cardiology   Essential hypertension 11/24/2015   GERD (gastroesophageal reflux disease)    HLA B27 (HLA B27 positive)    Hypercholesteremia    Hyperlipidemia 11/24/2015   Hypertension    IBS (irritable bowel syndrome)    Joint pain    Neuropathy    OAB (overactive bladder)    Obesity    Osteoarthritis    Osteoporosis    Pre-diabetes    Rheumatoid arthritis (HCC) 11/24/2015   Sciatica    Seasonal allergies    Spinal stenosis    Past Surgical History:  Procedure Laterality Date   ABDOMINAL HYSTERECTOMY  1995   BACK SURGERY     BREAST SURGERY     REDUCTION   CATARACT EXTRACTION W/PHACO  08/01/2012   Procedure: CATARACT EXTRACTION PHACO AND INTRAOCULAR LENS PLACEMENT (IOC);  Surgeon: Ben Bracken, MD;  Location: [facility] OR;  Service: Ophthalmology;  Laterality: Right;   EYE SURGERY Bilateral    cataract   HERNIA REPAIR     RIGHT ING.   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   Medication monitoring encounter 09/15/2021   Pain of left hip joint 08/10/2020   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   Prediabetes 02/01/2020   Body mass index (BMI) 36.0-36.9, adult 02/01/2020   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   Inflammation of sacroiliac joint (HCC) 10/06/2018   Chronic bronchitis (HCC) 09/25/2018   Mucopurulent chronic bronchitis (HCC) 08/21/2018   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   Spondylosis 06/13/2018   Status post lumbar spinal fusion 06/13/2018   Status post lumbar spine surgery for decompression of spinal cord 06/13/2018   History of total knee replacement, 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   History of total knee arthroplasty, right 09/20/2017   OA (osteoarthritis) of knee 12/26/2015   Essential hypertension 11/24/2015  Hyperlipidemia 11/24/2015   Rheumatoid arthritis (HCC) 11/24/2015   Spinal stenosis of lumbar region 08/24/2014   Lumbar spondylosis 07/21/2014    PCP: Darnelle Elders, PA-C  REFERRING PROVIDER: Esequiel Hector, PA-C   REFERRING DIAG: [phone] (ICD-10-CM) - Other spondylosis with radiculopathy, lumbar region   Rationale for Evaluation and Treatment: Rehabilitation  THERAPY DIAG:  Chronic bilateral low back pain with bilateral sciatica  Difficulty in walking, not elsewhere classified  Muscle weakness (generalized)  Unspecified lack of coordination  Unsteadiness on feet  Abnormal posture  ONSET DATE: Chronic   SUBJECTIVE:                                                                                                                                                                                            SUBJECTIVE STATEMENT " I feel good today but me knees hurt a little bit." She does her HEP "somewhat".   PERTINENT HISTORY:  3-4 back surgeries but no rods, RA, osteoporosis  PAIN:  Are you having pain? Yes: NPRS scale: 1/10  Pain location: Lateral L Knee Pain description: aching Aggravating factors: Inconsistent  Relieving factors: once starts to move it feels better  PRECAUTIONS: Fall  RED FLAGS: None   WEIGHT BEARING RESTRICTIONS: No  FALLS:  Has patient fallen in last 6 months? Yes. Number of falls 1 - went to mailbox, turned to walk up hill and fell  LIVING ENVIRONMENT: Lives with: lives with their family (son and daughter) Lives in: House/apartment Stairs: Yes: Internal: 14 steps; on right going up Has following equipment at home: None  OCCUPATION: Retired; shopping, thinking about going to [employer], loves doing yard work but worried about falls  PLOF: Independent  PATIENT GOALS: Improve balance and back pain  NEXT MD VISIT: n/a  OBJECTIVE:  Note: Objective measures were completed at Evaluation unless otherwise noted.  DIAGNOSTIC FINDINGS:  Lumbar CT in 07/06/22 IMPRESSION: 1. Prior thoracolumbar fusion as detailed above. Unchanged lucency about the T11, L3, and S1 screws associated with absent solid arthrodesis at T11-12, L2-3, and L5-S1. 2. Suspected chronic severe left and moderate right neural foraminal stenosis at L5-S1. 3. Mild-to-moderate neural foraminal stenosis at L2-3. 4. Widely patent lumbar spinal canal following decompression. 5. Mild thoracic disc degeneration and advanced upper thoracic facet arthrosis without significant stenosis. 6.  Aortic Atherosclerosis (ICD10-I70.0).  PATIENT SURVEYS:  Modified Oswestry Low Back Pain Disability Questionnaire: 20 / 50 = 40.0  %  COGNITION: Overall cognitive status: Within functional limits for tasks assessed     SENSATION: WFL  MUSCLE LENGTH: Hamstrings: Right (Limited due to prior knee TKA); Left 80 deg deg Andy Bannister  test: did not assess  POSTURE: decreased lumbar lordosis, increased thoracic kyphosis, and flexed trunk   PALPATION: TTP bilat lumbar paraspinals, L>R glute max and piriformis  LUMBAR ROM:   AROM eval  Flexion 80%  Extension 10%  Right lateral flexion 1" above knee *  Left lateral flexion 1" above knee *  Right rotation 100%  Left rotation 100%   (Blank rows = not tested)  LOWER EXTREMITY ROM:     Active  Right eval Left eval  Hip flexion    Hip extension    Hip abduction    Hip adduction    Hip internal rotation    Hip external rotation    Knee flexion    Knee extension    Ankle dorsiflexion    Ankle plantarflexion    Ankle inversion    Ankle eversion     (Blank rows = not tested)  LOWER EXTREMITY MMT:    MMT Right eval Left eval  Hip flexion 4 4  Hip extension 3 3  Hip abduction 3+ 3+  Hip adduction    Hip internal rotation    Hip external rotation    Knee flexion 4 4  Knee extension 5 5  Ankle dorsiflexion 4- 4-  Ankle plantarflexion    Ankle inversion    Ankle eversion     (Blank rows = not tested)  LUMBAR SPECIAL TESTS:  Straight leg raise test: Negative, Single leg stance test: Positive, and FABER test: Positive  FUNCTIONAL TESTS:  5 times sit to stand: 15.43 sec Berg Balance Scale: 44/56     GAIT: Distance walked: Into clinic Assistive device utilized: Single point cane Level of assistance: Modified independence Comments: diminished bilat step length, forward flexed posture, trendelenburg bilat  TREATMENT DATE: 12/16/23 Assess goals Nustep L5 Resisted gait 20# R Lateral step x8, 10# L Lateral steps x8- very unstable with on LLE  Red band resisted hip abd, ext, flex x10 each Red band Row 2x12 Yellow ball OH press  2x10  12/11/23 Nustep L5  S2S x10,  6in step up x10 each 4in lateral step x10 each Resisted gait 10# forward x10, lateral x5 each way Red band row 2x12 Yellow ball chest press x10, OH press x10 Walk outside w/o AD 587ft    12/09/23 Nustep L5 S2S x10- cue to avoid knees on the table Blue band HS curl x10 each leg 3# LAQ x10 each- R knee had some pain Blue band Rows 2x10     Bars  March in place on airex pad  Walk forward/backwards on airex mats 3x71ft  One foot on airex, on foot on 6in box, head swivels x20sec each leg 6in step upsx10 each leg HS, ITB stretch B legs       12/04/23 NuStep L5 6in step up  x10 each- narrow BOS, uses UE when stepping with LLE, decreased step length 4in lateral step up x10 each  Blue band  Row 2x15 knee flex 2 x10 each LAQ x10 3#, each leg Bridges 2x10 HS stretch PROM hip flex Supine trunk rotations  11/27/23 NuStep L4 Sit to stand 2x10 emphasize hip ext and knee valgus Resisted shoulder ext. Red band 2x10 Rows red band 2x10 March In place 2x20 Tandem walking at counter top  Cross over steps   Side steps   11/22/23 See HEP below  PATIENT EDUCATION:  Education details: Exam findings, POC, initial HEP Person educated: Patient Education method: Explanation, Demonstration, and Handouts Education comprehension: verbalized understanding, returned demonstration, and needs further education  HOME EXERCISE PROGRAM: Access Code: [email or web address] URL: [email or web address] Date: 11/22/2023 Prepared by: Gellen April Erman [place]  Exercises - Seated Piriformis Stretch  - 1 x daily - 7 x weekly - 2 sets - 30 sec hold - Seated Hamstring Stretch  - 1 x daily - 7 x weekly - 2 sets - 30 sec hold - Right Standing Lateral Shift Correction at Wall - Hold  - 1 x daily - 7 x weekly - 2 sets -  30 sec hold - Standing Lumbar Extension at Wall - Forearms  - 1 x daily - 7 x weekly - 2 sets - 30 sec hold  ASSESSMENT:  CLINICAL IMPRESSION: She arrived doing well. She had her cane, but wasn't using it. She met some of her STGs. She did well with R resisted lateral step, but had a lot of instability with L lateral step, so I decreased her resistance. She needed cuing with resisted hip ext and flex to avoid trunk flexion. She is doing well with HEP and didn't have any questions.  OBJECTIVE IMPAIRMENTS: Abnormal gait, decreased balance, decreased coordination, decreased endurance, decreased mobility, difficulty walking, decreased ROM, decreased strength, hypomobility, increased fascial restrictions, increased muscle spasms, impaired flexibility, improper body mechanics, postural dysfunction, and pain.   ACTIVITY LIMITATIONS: lifting, bending, standing, squatting, stairs, transfers, bed mobility, and locomotion level  PARTICIPATION LIMITATIONS: meal prep, cleaning, laundry, shopping, community activity, and yard work  PERSONAL FACTORS: Age, Fitness, Past/current experiences, and Time since onset of injury/illness/exacerbation are also affecting patient's functional outcome.   REHAB POTENTIAL: Good  CLINICAL DECISION MAKING: Evolving/moderate complexity  EVALUATION COMPLEXITY: Moderate   GOALS: Goals reviewed with patient? Yes  SHORT TERM GOALS: Target date: 12/20/2023   Pt will be ind with initial HEP Baseline: Goal status: MET 12/16/23  2.  Pt will demo L = R figure 4 stretch for improved hip flexibility and ROM Baseline:  Goal status: MET 12/16/23  3.  Pt will be able to maintain increased trunk extension with standing and walking posture independently for improved stability Baseline: Trunk flexed ~10-20 deg Goal status: INITIAL    LONG TERM GOALS: Target date: 01/17/2024   Pt will be ind with management and progression of HEP Baseline:  Goal status: INITIAL  2.  Pt  will have improved 5x STS to </=13 sec to demo increased functional LE strength and decreased fall risk Baseline: 15.43 sec Goal status: INITIAL  3.  Pt will have improved Berg Balance Score to >/=52 to demo MCID Baseline: 44 Goal status: INITIAL  4.  Pt will have improved modified Oswestry score to </=30% to demo MCID Baseline: 40% Goal status: INITIAL  5.  Pt will report >/=50% improvement in overall pain Baseline:  Goal status: INITIAL   PLAN:  PT FREQUENCY: 2x/week  PT DURATION: 8 weeks  PLANNED INTERVENTIONS: [id number]- PT Re-evaluation, 97110-Therapeutic exercises, 97530- Therapeutic activity, 97112- Neuromuscular re-education, 97535- Self Care, 29562- Manual therapy, [phone]- Gait training, [phone]- Aquatic Therapy, [phone]- Electrical stimulation (unattended), [phone]- Ionotophoresis 4mg /ml Dexamethasone , Patient/Family education, Balance training, Stair training, Taping, Dry Needling, Joint mobilization, Spinal mobilization, Cryotherapy, and Moist heat.  PLAN FOR NEXT SESSION: Assess response to HEP. Continue to improve hip/lumbar mobility as tolerated. Initiate core and hip strengthening. Work on improving postural stability and balance.    Laurelyn Ponder, SPTA 12/16/2023, 11:06 AM

## 2023-12-17 DIAGNOSIS — M810 Age-related osteoporosis without current pathological fracture: Secondary | ICD-10-CM | POA: Diagnosis not present

## 2023-12-17 DIAGNOSIS — G629 Polyneuropathy, unspecified: Secondary | ICD-10-CM | POA: Diagnosis not present

## 2023-12-17 DIAGNOSIS — Z1331 Encounter for screening for depression: Secondary | ICD-10-CM | POA: Diagnosis not present

## 2023-12-17 DIAGNOSIS — R2681 Unsteadiness on feet: Secondary | ICD-10-CM | POA: Diagnosis not present

## 2023-12-17 DIAGNOSIS — Z0000 Encounter for general adult medical examination without abnormal findings: Secondary | ICD-10-CM | POA: Diagnosis not present

## 2023-12-17 DIAGNOSIS — Z6827 Body mass index (BMI) 27.0-27.9, adult: Secondary | ICD-10-CM | POA: Diagnosis not present

## 2023-12-18 ENCOUNTER — Encounter: Payer: Self-pay | Admitting: Physical Therapy

## 2023-12-18 ENCOUNTER — Ambulatory Visit: Admitting: Physical Therapy

## 2023-12-18 DIAGNOSIS — M6281 Muscle weakness (generalized): Secondary | ICD-10-CM | POA: Diagnosis not present

## 2023-12-18 DIAGNOSIS — G8929 Other chronic pain: Secondary | ICD-10-CM | POA: Diagnosis not present

## 2023-12-18 DIAGNOSIS — M5441 Lumbago with sciatica, right side: Secondary | ICD-10-CM | POA: Diagnosis not present

## 2023-12-18 DIAGNOSIS — R293 Abnormal posture: Secondary | ICD-10-CM

## 2023-12-18 DIAGNOSIS — M5442 Lumbago with sciatica, left side: Secondary | ICD-10-CM | POA: Diagnosis not present

## 2023-12-18 DIAGNOSIS — R2681 Unsteadiness on feet: Secondary | ICD-10-CM

## 2023-12-18 DIAGNOSIS — R262 Difficulty in walking, not elsewhere classified: Secondary | ICD-10-CM

## 2023-12-18 NOTE — Therapy (Signed)
 OUTPATIENT PHYSICAL THERAPY THORACOLUMBAR TREATMENT   Patient Name: Joy [name] MRN: [id number] DOB:1941/06/18, 83 y.o., female Today's Date: 12/18/2023  END OF SESSION:  PT End of Session - 12/18/23 1104     Visit Number 7    Date for PT Re-Evaluation 01/17/24    Authorization Type Medicare    Progress Note Due on Visit 10    PT Start Time 1100    PT Stop Time 1145    PT Time Calculation (min) 45 min             Past Medical History:  Diagnosis Date   Ankylosing spondylitis (HCC)    Anxiety    Arthritis    RHEUMATOID   Back pain    Bronchitis    Constipation    COPD (chronic obstructive pulmonary disease) (HCC)    CXR 01/11/18 showed mild COPD and chronic bronchitis   CTS (carpal tunnel syndrome)    Dry eye    Dry mouth    Dysrhythmia    "irregularity" unknown at this time - being evaluated by cardiology   Essential hypertension 11/24/2015   GERD (gastroesophageal reflux disease)    HLA B27 (HLA B27 positive)    Hypercholesteremia    Hyperlipidemia 11/24/2015   Hypertension    IBS (irritable bowel syndrome)    Joint pain    Neuropathy    OAB (overactive bladder)    Obesity    Osteoarthritis    Osteoporosis    Pre-diabetes    Rheumatoid arthritis (HCC) 11/24/2015   Sciatica    Seasonal allergies    Spinal stenosis    Past Surgical History:  Procedure Laterality Date   ABDOMINAL HYSTERECTOMY  1995   BACK SURGERY     BREAST SURGERY     REDUCTION   CATARACT EXTRACTION W/PHACO  08/01/2012   Procedure: CATARACT EXTRACTION PHACO AND INTRAOCULAR LENS PLACEMENT (IOC);  Surgeon: Ben Bracken, MD;  Location: [facility] OR;  Service: Ophthalmology;  Laterality: Right;   EYE SURGERY Bilateral    cataract   HERNIA REPAIR     RIGHT ING.   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   Medication monitoring encounter 09/15/2021   Pain of left hip joint 08/10/2020   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   Prediabetes 02/01/2020   Body mass index (BMI) 36.0-36.9, adult 02/01/2020   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   Inflammation of sacroiliac joint (HCC) 10/06/2018   Chronic bronchitis (HCC) 09/25/2018   Mucopurulent chronic bronchitis (HCC) 08/21/2018   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   Spondylosis 06/13/2018   Status post lumbar spinal fusion 06/13/2018   Status post lumbar spine surgery for decompression of spinal cord 06/13/2018   History of total knee replacement, 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   History of total knee arthroplasty, right 09/20/2017   OA (osteoarthritis) of knee 12/26/2015   Essential hypertension 11/24/2015  Hyperlipidemia 11/24/2015   Rheumatoid arthritis (HCC) 11/24/2015   Spinal stenosis of lumbar region 08/24/2014   Lumbar spondylosis 07/21/2014    PCP: Darnelle Elders, PA-C  REFERRING PROVIDER: Esequiel Hector, PA-C   REFERRING DIAG: [phone] (ICD-10-CM) - Other spondylosis with radiculopathy, lumbar region   Rationale for Evaluation and Treatment: Rehabilitation  THERAPY DIAG:  Chronic bilateral low back pain with bilateral sciatica  Difficulty in walking, not elsewhere classified  Muscle weakness (generalized)  Unsteadiness on feet  Abnormal posture  ONSET DATE: Chronic   SUBJECTIVE:                                                                                                                                                                                            SUBJECTIVE STATEMENT " I feel good today but me knees hurt a little bit." Has been doing her HEP   PERTINENT HISTORY:  3-4 back surgeries but no rods, RA, osteoporosis  PAIN:  Are you having pain? Yes: NPRS scale: 1/10  Pain location: B Knee Pain description: aching Aggravating factors: Inconsistent  Relieving factors: once starts to move it feels better  PRECAUTIONS: Fall  RED FLAGS: None   WEIGHT BEARING RESTRICTIONS: No  FALLS:  Has patient fallen in last 6 months? Yes. Number of falls 1 - went to mailbox, turned to walk up hill and fell  LIVING ENVIRONMENT: Lives with: lives with their family (son and daughter) Lives in: House/apartment Stairs: Yes: Internal: 14 steps; on right going up Has following equipment at home: None  OCCUPATION: Retired; shopping, thinking about going to [employer], loves doing yard work but worried about falls  PLOF: Independent  PATIENT GOALS: Improve balance and back pain  NEXT MD VISIT: n/a  OBJECTIVE:  Note: Objective measures were completed at Evaluation unless otherwise noted.  DIAGNOSTIC FINDINGS:  Lumbar CT in 07/06/22 IMPRESSION: 1. Prior thoracolumbar fusion as detailed above. Unchanged lucency about the T11, L3, and S1 screws associated with absent solid arthrodesis at T11-12, L2-3, and L5-S1. 2. Suspected chronic severe left and moderate right neural foraminal stenosis at L5-S1. 3. Mild-to-moderate neural foraminal stenosis at L2-3. 4. Widely patent lumbar spinal canal following decompression. 5. Mild thoracic disc degeneration and advanced upper thoracic facet arthrosis without significant stenosis. 6.  Aortic Atherosclerosis (ICD10-I70.0).  PATIENT SURVEYS:  Modified Oswestry Low Back Pain Disability Questionnaire: 20 / 50 = 40.0 %  COGNITION: Overall cognitive status: Within  functional limits for tasks assessed     SENSATION: WFL  MUSCLE LENGTH: Hamstrings: Right (Limited due to prior knee TKA); Left 80 deg deg Thomas test: did not assess  POSTURE:  decreased lumbar lordosis, increased thoracic kyphosis, and flexed trunk   PALPATION: TTP bilat lumbar paraspinals, L>R glute max and piriformis  LUMBAR ROM:   AROM eval  Flexion 80%  Extension 10%  Right lateral flexion 1" above knee *  Left lateral flexion 1" above knee *  Right rotation 100%  Left rotation 100%   (Blank rows = not tested)  LOWER EXTREMITY ROM:     Active  Right eval Left eval  Hip flexion    Hip extension    Hip abduction    Hip adduction    Hip internal rotation    Hip external rotation    Knee flexion    Knee extension    Ankle dorsiflexion    Ankle plantarflexion    Ankle inversion    Ankle eversion     (Blank rows = not tested)  LOWER EXTREMITY MMT:    MMT Right eval Left eval  Hip flexion 4 4  Hip extension 3 3  Hip abduction 3+ 3+  Hip adduction    Hip internal rotation    Hip external rotation    Knee flexion 4 4  Knee extension 5 5  Ankle dorsiflexion 4- 4-  Ankle plantarflexion    Ankle inversion    Ankle eversion     (Blank rows = not tested)  LUMBAR SPECIAL TESTS:  Straight leg raise test: Negative, Single leg stance test: Positive, and FABER test: Positive  FUNCTIONAL TESTS:  5 times sit to stand: 15.43 sec Berg Balance Scale: 44/56     GAIT: Distance walked: Into clinic Assistive device utilized: Single point cane Level of assistance: Modified independence Comments: diminished bilat step length, forward flexed posture, trendelenburg bilat  TREATMENT DATE: Nustep L5 Airex S2S 2x10  Assess goals 6in stairs step over step, forward and backward 2x10 6in box taps 2x20 taps Green band Rows 2x10 Bar//  Airex tap colors cones  Side step on beam  Tandem walk forward and backwards  Lateral step over color  cones    12/16/23 Assess goals Nustep L5 Resisted gait 20# R Lateral step x8, 10# L Lateral steps x8- very unstable with on LLE  Red band resisted hip abd, ext, flex x10 each Red band Row 2x12 Yellow ball OH press 2x10  12/11/23 Nustep L5  S2S x10,  6in step up x10 each 4in lateral step x10 each Resisted gait 10# forward x10, lateral x5 each way Red band row 2x12 Yellow ball chest press x10, OH press x10 Walk outside w/o AD 522ft    12/09/23 Nustep L5 S2S x10- cue to avoid knees on the table Blue band HS curl x10 each leg 3# LAQ x10 each- R knee had some pain Blue band Rows 2x10     Bars  March in place on airex pad  Walk forward/backwards on airex mats 3x57ft  One foot on airex, on foot on 6in box, head swivels x20sec each leg 6in step upsx10 each leg HS, ITB stretch B legs       12/04/23 NuStep L5 6in step up  x10 each- narrow BOS, uses UE when stepping with LLE, decreased step length 4in lateral step up x10 each  Blue band  Row 2x15 knee flex 2 x10 each LAQ x10 3#, each leg Bridges 2x10 HS stretch PROM hip flex Supine trunk rotations  11/27/23 NuStep L4 Sit to stand 2x10 emphasize hip ext and knee valgus Resisted shoulder ext. Red band 2x10 Rows red band  2x10 March In place 2x20 Tandem walking at counter top  Cross over steps   Side steps   11/22/23 See HEP below                                                                                                                                 PATIENT EDUCATION:  Education details: Exam findings, POC, initial HEP Person educated: Patient Education method: Explanation, Demonstration, and Handouts Education comprehension: verbalized understanding, returned demonstration, and needs further education  HOME EXERCISE PROGRAM: Access Code: [email or web address] URL: [email or web address] Date: 11/22/2023 Prepared by: Gellen April Erman [place]  Exercises - Seated Piriformis  Stretch  - 1 x daily - 7 x weekly - 2 sets - 30 sec hold - Seated Hamstring Stretch  - 1 x daily - 7 x weekly - 2 sets - 30 sec hold - Right Standing Lateral Shift Correction at Wall - Hold  - 1 x daily - 7 x weekly - 2 sets - 30 sec hold - Standing Lumbar Extension at Wall - Forearms  - 1 x daily - 7 x weekly - 2 sets - 30 sec hold  ASSESSMENT:  CLINICAL IMPRESSION: She arrived  ambulating without her cane. She complained of some mild knee pain in both knees. She did well with S2S, but needed cuing to widen her BOS. She progressed with her s2s speed but is not yet at her goal. She has some instability with box taps and needed cue to slow down. She progressed her step ups to step over step and needed minimal assist. All balance activities were challenging for her. She would benefit from cont. PT to further improve her posture and stability . OBJECTIVE IMPAIRMENTS: Abnormal gait, decreased balance, decreased coordination, decreased endurance, decreased mobility, difficulty walking, decreased ROM, decreased strength, hypomobility, increased fascial restrictions, increased muscle spasms, impaired flexibility, improper body mechanics, postural dysfunction, and pain.   ACTIVITY LIMITATIONS: lifting, bending, standing, squatting, stairs, transfers, bed mobility, and locomotion level  PARTICIPATION LIMITATIONS: meal prep, cleaning, laundry, shopping, community activity, and yard work  PERSONAL FACTORS: Age, Fitness, Past/current experiences, and Time since onset of injury/illness/exacerbation are also affecting patient's functional outcome.   REHAB POTENTIAL: Good  CLINICAL DECISION MAKING: Evolving/moderate complexity  EVALUATION COMPLEXITY: Moderate   GOALS: Goals reviewed with patient? Yes  SHORT TERM GOALS: Target date: 12/20/2023   Pt will be ind with initial HEP Baseline: Goal status: MET 12/16/23  2.  Pt will demo L = R figure 4 stretch for improved hip flexibility and ROM Baseline:   Goal status: MET 12/16/23  3.  Pt will be able to maintain increased trunk extension with standing and walking posture independently for improved stability Baseline: Trunk flexed ~10-20 deg Goal status: INITIAL    LONG TERM GOALS: Target date: 01/17/2024   Pt will be ind with management and progression of HEP Baseline:  Goal status: INITIAL    2.  Pt will have improved 5x  STS to </=13 sec to demo increased functional LE strength and decreased fall risk Baseline: 15.43 sec Goal status: 14.88 12/18/23  3.  Pt will have improved Berg Balance Score to >/=52 to demo MCID Baseline: 44 Goal status: INITIAL  4.  Pt will have improved modified Oswestry score to </=30% to demo MCID Baseline: 40% Goal status: INITIAL  5.  Pt will report >/=50% improvement in overall pain Baseline:  Goal status: INITIAL   PLAN:  PT FREQUENCY: 2x/week  PT DURATION: 8 weeks  PLANNED INTERVENTIONS: [id number]- PT Re-evaluation, 97110-Therapeutic exercises, 97530- Therapeutic activity, 97112- Neuromuscular re-education, 97535- Self Care, 91478- Manual therapy, [phone]- Gait training, [phone]- Aquatic Therapy, [phone]- Electrical stimulation (unattended), [phone]- Ionotophoresis 4mg /ml Dexamethasone , Patient/Family education, Balance training, Stair training, Taping, Dry Needling, Joint mobilization, Spinal mobilization, Cryotherapy, and Moist heat.  PLAN FOR NEXT SESSION: Advanced HEP. Continue to improve hip/lumbar mobility as tolerated. Initiate core and hip strengthening. Work on improving postural stability and balance.    Laurelyn Ponder, SPTA 12/18/2023, 11:05 AM

## 2023-12-23 DIAGNOSIS — M48062 Spinal stenosis, lumbar region with neurogenic claudication: Secondary | ICD-10-CM | POA: Diagnosis not present

## 2023-12-23 DIAGNOSIS — M545 Low back pain, unspecified: Secondary | ICD-10-CM | POA: Diagnosis not present

## 2023-12-24 ENCOUNTER — Ambulatory Visit: Attending: Neurosurgery | Admitting: Physical Therapy

## 2023-12-24 ENCOUNTER — Encounter: Payer: Self-pay | Admitting: Physical Therapy

## 2023-12-24 DIAGNOSIS — R293 Abnormal posture: Secondary | ICD-10-CM | POA: Insufficient documentation

## 2023-12-24 DIAGNOSIS — G8929 Other chronic pain: Secondary | ICD-10-CM | POA: Diagnosis not present

## 2023-12-24 DIAGNOSIS — M6281 Muscle weakness (generalized): Secondary | ICD-10-CM | POA: Diagnosis not present

## 2023-12-24 DIAGNOSIS — R262 Difficulty in walking, not elsewhere classified: Secondary | ICD-10-CM | POA: Diagnosis not present

## 2023-12-24 DIAGNOSIS — R279 Unspecified lack of coordination: Secondary | ICD-10-CM | POA: Insufficient documentation

## 2023-12-24 DIAGNOSIS — M5441 Lumbago with sciatica, right side: Secondary | ICD-10-CM | POA: Diagnosis not present

## 2023-12-24 DIAGNOSIS — R2681 Unsteadiness on feet: Secondary | ICD-10-CM | POA: Diagnosis not present

## 2023-12-24 DIAGNOSIS — M5442 Lumbago with sciatica, left side: Secondary | ICD-10-CM | POA: Insufficient documentation

## 2023-12-24 DIAGNOSIS — R278 Other lack of coordination: Secondary | ICD-10-CM | POA: Insufficient documentation

## 2023-12-24 NOTE — Therapy (Signed)
 OUTPATIENT PHYSICAL THERAPY THORACOLUMBAR TREATMENT   Patient Name: ELLIANNA DUDZINSKI MRN: [id number] DOB:Nov 23, 1940, 83 y.o., female Today's Date: 12/24/2023  END OF SESSION:  PT End of Session - 12/24/23 1100     Visit Number 8    Date for PT Re-Evaluation 01/17/24    Authorization Type Medicare             Past Medical History:  Diagnosis Date   Ankylosing spondylitis (HCC)    Anxiety    Arthritis    RHEUMATOID   Back pain    Bronchitis    Constipation    COPD (chronic obstructive pulmonary disease) (HCC)    CXR 01/11/18 showed mild COPD and chronic bronchitis   CTS (carpal tunnel syndrome)    Dry eye    Dry mouth    Dysrhythmia    "irregularity" unknown at this time - being evaluated by cardiology   Essential hypertension 11/24/2015   GERD (gastroesophageal reflux disease)    HLA B27 (HLA B27 positive)    Hypercholesteremia    Hyperlipidemia 11/24/2015   Hypertension    IBS (irritable bowel syndrome)    Joint pain    Neuropathy    OAB (overactive bladder)    Obesity    Osteoarthritis    Osteoporosis    Pre-diabetes    Rheumatoid arthritis (HCC) 11/24/2015   Sciatica    Seasonal allergies    Spinal stenosis    Past Surgical History:  Procedure Laterality Date   ABDOMINAL HYSTERECTOMY  1995   BACK SURGERY     BREAST SURGERY     REDUCTION   CATARACT EXTRACTION W/PHACO  08/01/2012   Procedure: CATARACT EXTRACTION PHACO AND INTRAOCULAR LENS PLACEMENT (IOC);  Surgeon: Ben Bracken, MD;  Location: [facility] OR;  Service: Ophthalmology;  Laterality: Right;   EYE SURGERY Bilateral    cataract   HERNIA REPAIR     RIGHT ING.   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   Medication monitoring encounter 09/15/2021   Pain of left hip joint 08/10/2020   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   Prediabetes 02/01/2020   Body mass index (BMI) 36.0-36.9, adult 02/01/2020   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   Inflammation of sacroiliac joint (HCC) 10/06/2018   Chronic bronchitis (HCC) 09/25/2018   Mucopurulent chronic bronchitis (HCC) 08/21/2018   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   Spondylosis 06/13/2018   Status post lumbar spinal fusion 06/13/2018   Status post lumbar spine surgery for decompression of spinal cord 06/13/2018   History of total knee replacement, 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   History of total knee arthroplasty, right 09/20/2017   OA (osteoarthritis) of knee 12/26/2015   Essential hypertension 11/24/2015   Hyperlipidemia 11/24/2015   Rheumatoid arthritis (HCC) 11/24/2015   Spinal stenosis of lumbar region 08/24/2014   Lumbar spondylosis 07/21/2014    PCP: Darnelle Elders, PA-C  REFERRING PROVIDER:  [place] Boneta Busing Merri Abbe, PA-C   REFERRING DIAG: [phone] (ICD-10-CM) - Other spondylosis with radiculopathy, lumbar region   Rationale for Evaluation and Treatment: Rehabilitation  THERAPY DIAG:  Chronic bilateral low back pain with bilateral sciatica  Difficulty in walking, not elsewhere classified  Muscle weakness (generalized)  Unsteadiness on feet  Unspecified lack of coordination  Abnormal posture  Other lack of coordination  ONSET DATE: Chronic   SUBJECTIVE:                                                                                                                                                                                            SUBJECTIVE STATEMENT Both knees are hurting today. She needed to walk with her cane.    PERTINENT HISTORY:  3-4 back surgeries but no rods, RA, osteoporosis  PAIN:  Are you having pain? Yes: NPRS scale: 4/10  Pain location: Back side of B knees Pain description: constant aching  Aggravating factors: Inconsistent  Relieving factors: once starts to move it feels better  PRECAUTIONS: Fall  RED FLAGS: None   WEIGHT BEARING RESTRICTIONS: No  FALLS:  Has patient fallen in last 6 months? Yes. Number of falls 1 - went to mailbox, turned to walk up hill and fell  LIVING ENVIRONMENT: Lives with: lives with their family (son and daughter) Lives in: House/apartment Stairs: Yes: Internal: 14 steps; on right going up Has following equipment at home: None  OCCUPATION: Retired; shopping, thinking about going to [employer], loves doing yard work but worried about falls  PLOF: Independent  PATIENT GOALS: Improve balance and back pain  NEXT MD VISIT: n/a  OBJECTIVE:  Note: Objective measures were completed at Evaluation unless otherwise noted.  DIAGNOSTIC FINDINGS:  Lumbar CT in 07/06/22 IMPRESSION: 1. Prior thoracolumbar fusion as detailed above. Unchanged lucency about the T11, L3, and S1 screws associated with absent solid arthrodesis at T11-12, L2-3, and L5-S1. 2. Suspected chronic severe left and moderate right neural foraminal stenosis at L5-S1. 3. Mild-to-moderate neural foraminal stenosis at L2-3. 4. Widely patent lumbar spinal canal following decompression. 5. Mild thoracic disc degeneration and advanced upper thoracic facet arthrosis without significant stenosis. 6.  Aortic Atherosclerosis (ICD10-I70.0).  PATIENT SURVEYS:  Modified Oswestry Low Back Pain Disability Questionnaire: 20 / 50 = 40.0 %  COGNITION: Overall cognitive status: Within functional limits for tasks  assessed     SENSATION: WFL  MUSCLE LENGTH: Hamstrings: Right (Limited due to prior knee TKA); Left 80 deg deg Thomas test: did not assess  POSTURE: decreased lumbar lordosis, increased thoracic kyphosis, and flexed trunk   PALPATION: TTP bilat lumbar paraspinals, L>R glute max and  piriformis  LUMBAR ROM:   AROM eval  Flexion 80%  Extension 10%  Right lateral flexion 1" above knee *  Left lateral flexion 1" above knee *  Right rotation 100%  Left rotation 100%   (Blank rows = not tested)  LOWER EXTREMITY ROM:     Active  Right eval Left eval  Hip flexion    Hip extension    Hip abduction    Hip adduction    Hip internal rotation    Hip external rotation    Knee flexion    Knee extension    Ankle dorsiflexion    Ankle plantarflexion    Ankle inversion    Ankle eversion     (Blank rows = not tested)  LOWER EXTREMITY MMT:    MMT Right eval Left eval  Hip flexion 4 4  Hip extension 3 3  Hip abduction 3+ 3+  Hip adduction    Hip internal rotation    Hip external rotation    Knee flexion 4 4  Knee extension 5 5  Ankle dorsiflexion 4- 4-  Ankle plantarflexion    Ankle inversion    Ankle eversion     (Blank rows = not tested)  LUMBAR SPECIAL TESTS:  Straight leg raise test: Negative, Single leg stance test: Positive, and FABER test: Positive  FUNCTIONAL TESTS:  5 times sit to stand: 15.43 sec Berg Balance Scale: 44/56     GAIT: Distance walked: Into clinic Assistive device utilized: Single point cane Level of assistance: Modified independence Comments: diminished bilat step length, forward flexed posture, trendelenburg bilat  TREATMENT DATE: 12/24/23 Gait with cane outside- Slant board stretch Black bar heel raise SLS with 6in box   HS,calf,  ITB stretch B legs   Nustep L5 Airex S2S 2x10  Assess goals 6in stairs step over step, forward and backward 2x10 6in box taps 2x20 taps Green band Rows 2x10 Bar//  Airex tap colors  cones  Side step on beam  Tandem walk forward and backwards  Lateral step over color cones    12/16/23 Assess goals Nustep L5 Resisted gait 20# R Lateral step x8, 10# L Lateral steps x8- very unstable with on LLE  Red band resisted hip abd, ext, flex x10 each Red band Row 2x12 Yellow ball OH press 2x10  12/11/23 Nustep L5  S2S x10,  6in step up x10 each 4in lateral step x10 each Resisted gait 10# forward x10, lateral x5 each way Red band row 2x12 Yellow ball chest press x10, OH press x10 Walk outside w/o AD 575ft    12/09/23 Nustep L5 S2S x10- cue to avoid knees on the table Blue band HS curl x10 each leg 3# LAQ x10 each- R knee had some pain Blue band Rows 2x10     Bars  March in place on airex pad  Walk forward/backwards on airex mats 3x37ft  One foot on airex, on foot on 6in box, head swivels x20sec each leg 6in step upsx10 each leg HS, ITB stretch B legs       12/04/23 NuStep L5 6in step up  x10 each- narrow BOS, uses UE when stepping with LLE, decreased step length 4in lateral step up x10 each  Blue band  Row 2x15 knee flex 2 x10 each LAQ x10 3#, each leg Bridges 2x10 HS stretch PROM hip flex Supine trunk rotations  11/27/23 NuStep L4 Sit to stand 2x10 emphasize hip ext and knee valgus Resisted shoulder  ext. Red band 2x10 Rows red band 2x10 March In place 2x20 Tandem walking at counter top  Cross over steps   Side steps   11/22/23 See HEP below                                                                                                                                 PATIENT EDUCATION:  Education details: Exam findings, POC, initial HEP Person educated: Patient Education method: Explanation, Demonstration, and Handouts Education comprehension: verbalized understanding, returned demonstration, and needs further education  HOME EXERCISE PROGRAM: Access Code: [email or web address] URL: [email or web address] Date:  11/22/2023 Prepared by: Gellen April Erman [place]ward  Exercises - Seated Piriformis Stretch  - 1 x daily - 7 x weekly - 2 sets - 30 sec hold - Seated Hamstring Stretch  - 1 x daily - 7 x weekly - 2 sets - 30 sec hold - Right Standing Lateral Shift Correction at Wall - Hold  - 1 x daily - 7 x weekly - 2 sets - 30 sec hold - Standing Lumbar Extension at Wall - Forearms  - 1 x daily - 7 x weekly - 2 sets - 30 sec hold  ASSESSMENT:  CLINICAL IMPRESSION: She arrived  ambulating without her cane. He reported increased pain in both knees. She has tight calves and HS that could be contributing to her posterior knee pain. She did well with balance exercises with minimal instability. She felt pain relief after stretching and was ambulating better than when she arrived. She was encouraged to stretch at home and do her HEP to minimize tightness and knee pain.   OBJECTIVE IMPAIRMENTS: Abnormal gait, decreased balance, decreased coordination, decreased endurance, decreased mobility, difficulty walking, decreased ROM, decreased strength, hypomobility, increased fascial restrictions, increased muscle spasms, impaired flexibility, improper body mechanics, postural dysfunction, and pain.   ACTIVITY LIMITATIONS: lifting, bending, standing, squatting, stairs, transfers, bed mobility, and locomotion level  PARTICIPATION LIMITATIONS: meal prep, cleaning, laundry, shopping, community activity, and yard work  PERSONAL FACTORS: Age, Fitness, Past/current experiences, and Time since onset of injury/illness/exacerbation are also affecting patient's functional outcome.   REHAB POTENTIAL: Good  CLINICAL DECISION MAKING: Evolving/moderate complexity  EVALUATION COMPLEXITY: Moderate   GOALS: Goals reviewed with patient? Yes  SHORT TERM GOALS: Target date: 12/20/2023   Pt will be ind with initial HEP Baseline: Goal status: MET 12/16/23  2.  Pt will demo L = R figure 4 stretch for improved hip flexibility and  ROM Baseline:  Goal status: MET 12/16/23  3.  Pt will be able to maintain increased trunk extension with standing and walking posture independently for improved stability Baseline: Trunk flexed ~10-20 deg Goal status: INITIAL    LONG TERM GOALS: Target date: 01/17/2024   Pt will be ind with management and progression of HEP Baseline:  Goal status: INITIAL    2.  Pt will have improved 5x STS to </=13 sec to demo increased functional LE strength and  decreased fall risk Baseline: 15.43 sec Goal status: 14.88 12/18/23  3.  Pt will have improved Berg Balance Score to >/=52 to demo MCID Baseline: 44 Goal status: INITIAL  4.  Pt will have improved modified Oswestry score to </=30% to demo MCID Baseline: 40% Goal status: INITIAL  5.  Pt will report >/=50% improvement in overall pain Baseline:  Goal status: INITIAL   PLAN:  PT FREQUENCY: 2x/week  PT DURATION: 8 weeks  PLANNED INTERVENTIONS: [id number]- PT Re-evaluation, 97110-Therapeutic exercises, 97530- Therapeutic activity, 97112- Neuromuscular re-education, 97535- Self Care, 16109- Manual therapy, [phone]- Gait training, [phone]- Aquatic Therapy, [phone]- Electrical stimulation (unattended), [phone]- Ionotophoresis 4mg /ml Dexamethasone , Patient/Family education, Balance training, Stair training, Taping, Dry Needling, Joint mobilization, Spinal mobilization, Cryotherapy, and Moist heat.  PLAN FOR NEXT SESSION: Advanced HEP. Continue to improve hip/lumbar mobility as tolerated. Initiate core and hip strengthening. Work on improving postural stability and balance.    Laurelyn Ponder, SPTA 12/24/2023, 11:01 AM

## 2023-12-26 ENCOUNTER — Ambulatory Visit: Admitting: Physical Therapy

## 2023-12-26 ENCOUNTER — Encounter: Payer: Self-pay | Admitting: Physical Therapy

## 2023-12-26 DIAGNOSIS — R2681 Unsteadiness on feet: Secondary | ICD-10-CM

## 2023-12-26 DIAGNOSIS — M6281 Muscle weakness (generalized): Secondary | ICD-10-CM

## 2023-12-26 DIAGNOSIS — G8929 Other chronic pain: Secondary | ICD-10-CM | POA: Diagnosis not present

## 2023-12-26 DIAGNOSIS — M5442 Lumbago with sciatica, left side: Secondary | ICD-10-CM | POA: Diagnosis not present

## 2023-12-26 DIAGNOSIS — R262 Difficulty in walking, not elsewhere classified: Secondary | ICD-10-CM

## 2023-12-26 DIAGNOSIS — M5441 Lumbago with sciatica, right side: Secondary | ICD-10-CM | POA: Diagnosis not present

## 2023-12-26 NOTE — Therapy (Signed)
 OUTPATIENT PHYSICAL THERAPY THORACOLUMBAR TREATMENT   Patient Name: Joy [name] MRN: [id number] DOB:11-22-1940, 83 y.o., female Today's Date: 12/26/2023  END OF SESSION:  PT End of Session - 12/26/23 1056     Visit Number 9    Date for PT Re-Evaluation 01/17/24    PT Start Time 1100    PT Stop Time 1145    PT Time Calculation (min) 45 min    Activity Tolerance Patient tolerated treatment well    Behavior During Therapy [facility] for tasks assessed/performed             Past Medical History:  Diagnosis Date   Ankylosing spondylitis (HCC)    Anxiety    Arthritis    RHEUMATOID   Back pain    Bronchitis    Constipation    COPD (chronic obstructive pulmonary disease) (HCC)    CXR 01/11/18 showed mild COPD and chronic bronchitis   CTS (carpal tunnel syndrome)    Dry eye    Dry mouth    Dysrhythmia    "irregularity" unknown at this time - being evaluated by cardiology   Essential hypertension 11/24/2015   GERD (gastroesophageal reflux disease)    HLA B27 (HLA B27 positive)    Hypercholesteremia    Hyperlipidemia 11/24/2015   Hypertension    IBS (irritable bowel syndrome)    Joint pain    Neuropathy    OAB (overactive bladder)    Obesity    Osteoarthritis    Osteoporosis    Pre-diabetes    Rheumatoid arthritis (HCC) 11/24/2015   Sciatica    Seasonal allergies    Spinal stenosis    Past Surgical History:  Procedure Laterality Date   ABDOMINAL HYSTERECTOMY  1995   BACK SURGERY     BREAST SURGERY     REDUCTION   CATARACT EXTRACTION W/PHACO  08/01/2012   Procedure: CATARACT EXTRACTION PHACO AND INTRAOCULAR LENS PLACEMENT (IOC);  Surgeon: Ben Bracken, MD;  Location: [facility] OR;  Service: Ophthalmology;  Laterality: Right;   EYE SURGERY Bilateral    cataract   HERNIA REPAIR     RIGHT ING.   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   Medication monitoring encounter 09/15/2021   Pain of left hip joint 08/10/2020   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   Prediabetes 02/01/2020   Body mass index (BMI) 36.0-36.9, adult 02/01/2020   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   Inflammation of sacroiliac joint (HCC) 10/06/2018   Chronic bronchitis (HCC) 09/25/2018   Mucopurulent chronic bronchitis (HCC) 08/21/2018   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   Spondylosis 06/13/2018   Status post lumbar spinal fusion 06/13/2018   Status post lumbar spine surgery for decompression of spinal cord 06/13/2018   History of total knee replacement, 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   History of total knee arthroplasty, right 09/20/2017   OA (osteoarthritis) of knee 12/26/2015  Essential hypertension 11/24/2015   Hyperlipidemia 11/24/2015   Rheumatoid arthritis (HCC) 11/24/2015   Spinal stenosis of lumbar region 08/24/2014   Lumbar spondylosis 07/21/2014    PCP: Darnelle Elders, PA-C  REFERRING PROVIDER: Esequiel Hector, PA-C   REFERRING DIAG: [id number] (ICD-10-CM) - Other spondylosis with radiculopathy, lumbar region   Rationale for Evaluation and Treatment: Rehabilitation  THERAPY DIAG:  Chronic bilateral low back pain with bilateral sciatica  Difficulty in walking, not elsewhere classified  Unsteadiness on feet  Muscle weakness (generalized)  ONSET DATE: Chronic   SUBJECTIVE:                                                                                                                                                                                            SUBJECTIVE STATEMENT "Right now I feel good"   PERTINENT HISTORY:  3-4 back surgeries but no rods, RA, osteoporosis  PAIN:  Are you having pain? Yes: NPRS scale: 4/10  Pain location: Back side of B knees Pain description: constant aching  Aggravating factors: Inconsistent  Relieving factors: once starts to move it feels better  PRECAUTIONS: Fall  RED FLAGS: None   WEIGHT BEARING RESTRICTIONS: No  FALLS:  Has patient fallen in last 6 months? Yes. Number of falls 1 - went to mailbox, turned to walk up hill and fell  LIVING ENVIRONMENT: Lives with: lives with their family (son and daughter) Lives in: House/apartment Stairs: Yes: Internal: 14 steps; on right going up Has following equipment at home: None  OCCUPATION: Retired; shopping, thinking about going to [employer], loves doing yard work but worried about falls  PLOF: Independent  PATIENT GOALS: Improve balance and back pain  NEXT MD VISIT: n/a  OBJECTIVE:  Note: Objective measures were completed at Evaluation unless otherwise noted.  DIAGNOSTIC FINDINGS:  Lumbar CT in 07/06/22 IMPRESSION: 1. Prior thoracolumbar fusion as detailed above. Unchanged lucency about the T11, L3, and S1 screws associated with absent solid arthrodesis at T11-12, L2-3, and L5-S1. 2. Suspected chronic severe left and moderate right neural foraminal stenosis at L5-S1. 3. Mild-to-moderate neural foraminal stenosis at L2-3. 4. Widely patent lumbar spinal canal following decompression. 5. Mild thoracic disc degeneration and advanced upper thoracic facet arthrosis without significant stenosis. 6.  Aortic Atherosclerosis (ICD10-I70.0).  PATIENT SURVEYS:  Modified Oswestry Low Back Pain Disability Questionnaire: 20 / 50 = 40.0 %  COGNITION: Overall cognitive status: Within  functional limits for tasks assessed     SENSATION: WFL  MUSCLE LENGTH: Hamstrings: Right (Limited due to prior knee TKA); Left 80 deg deg Thomas test: did not assess  POSTURE: decreased lumbar lordosis, increased thoracic  kyphosis, and flexed trunk   PALPATION: TTP bilat lumbar paraspinals, L>R glute max and piriformis  LUMBAR ROM:   AROM eval 12/26/23  Flexion 80% WFL  Extension 10% Limited 50%  Right lateral flexion 1" above knee * Limited 25%  Left lateral flexion 1" above knee * Limited 50%  Right rotation 100% WNL  Left rotation 100% WFL   (Blank rows = not tested)  LOWER EXTREMITY ROM:     Active  Right eval Left eval  Hip flexion    Hip extension    Hip abduction    Hip adduction    Hip internal rotation    Hip external rotation    Knee flexion    Knee extension    Ankle dorsiflexion    Ankle plantarflexion    Ankle inversion    Ankle eversion     (Blank rows = not tested)  LOWER EXTREMITY MMT:    MMT Right eval Left eval  Hip flexion 4 4  Hip extension 3 3  Hip abduction 3+ 3+  Hip adduction    Hip internal rotation    Hip external rotation    Knee flexion 4 4  Knee extension 5 5  Ankle dorsiflexion 4- 4-  Ankle plantarflexion    Ankle inversion    Ankle eversion     (Blank rows = not tested)  LUMBAR SPECIAL TESTS:  Straight leg raise test: Negative, Single leg stance test: Positive, and FABER test: Positive  FUNCTIONAL TESTS:  5 times sit to stand: 15.43 sec Berg Balance Scale: 44/56     GAIT: Distance walked: Into clinic Assistive device utilized: Single point cane Level of assistance: Modified independence Comments: diminished bilat step length, forward flexed posture, trendelenburg bilat  TREATMENT DATE: 12/26/23 NuStep L 5 x6 min GOALS  ROM  BERG 50/56 S2S LE on airex 2x10 Alt 6in box taps form airex 2x10 HS curls 20lb 2x10 Leg Ext 5lb 2x10   12/24/23 Gait with cane outside- Slant board stretch Black bar heel  raise SLS with 6in box   HS,calf,  ITB stretch B legs   Nustep L5 Airex S2S 2x10  Assess goals 6in stairs step over step, forward and backward 2x10 6in box taps 2x20 taps Green band Rows 2x10 Bar//  Airex tap colors cones  Side step on beam  Tandem walk forward and backwards  Lateral step over color cones    12/16/23 Assess goals Nustep L5 Resisted gait 20# R Lateral step x8, 10# L Lateral steps x8- very unstable with on LLE  Red band resisted hip abd, ext, flex x10 each Red band Row 2x12 Yellow ball OH press 2x10  12/11/23 Nustep L5  S2S x10,  6in step up x10 each 4in lateral step x10 each Resisted gait 10# forward x10, lateral x5 each way Red band row 2x12 Yellow ball chest press x10, OH press x10 Walk outside w/o AD 519ft    12/09/23 Nustep L5 S2S x10- cue to avoid knees on the table Blue band HS curl x10 each leg 3# LAQ x10 each- R knee had some pain Blue band Rows 2x10     Bars  March in place on airex pad  Walk forward/backwards on airex mats 3x18ft  One foot on airex, on foot on 6in box, head swivels x20sec each leg 6in step upsx10 each leg HS, ITB stretch B legs       12/04/23 NuStep L5 6in step up  x10 each- narrow  BOS, uses UE when stepping with LLE, decreased step length 4in lateral step up x10 each  Blue band  Row 2x15 knee flex 2 x10 each LAQ x10 3#, each leg Bridges 2x10 HS stretch PROM hip flex Supine trunk rotations  11/27/23 NuStep L4 Sit to stand 2x10 emphasize hip ext and knee valgus Resisted shoulder ext. Red band 2x10 Rows red band 2x10 March In place 2x20 Tandem walking at counter top  Cross over steps   Side steps   11/22/23 See HEP below                                                                                                                                 PATIENT EDUCATION:  Education details: Exam findings, POC, initial HEP Person educated: Patient Education method:  Explanation, Demonstration, and Handouts Education comprehension: verbalized understanding, returned demonstration, and needs further education  HOME EXERCISE PROGRAM: Access Code: [email or web address] URL: [email or web address] Date: 11/22/2023 Prepared by: Gellen April Erman [place]ward  Exercises - Seated Piriformis Stretch  - 1 x daily - 7 x weekly - 2 sets - 30 sec hold - Seated Hamstring Stretch  - 1 x daily - 7 x weekly - 2 sets - 30 sec hold - Right Standing Lateral Shift Correction at Wall - Hold  - 1 x daily - 7 x weekly - 2 sets - 30 sec hold - Standing Lumbar Extension at Wall - Forearms  - 1 x daily - 7 x weekly - 2 sets - 30 sec hold  ASSESSMENT:  CLINICAL IMPRESSION: She arrived ambulating with cane. She had progressed increasing her ROM and BERG balance score. Fatigue present with functional interventions. Cues not to allow LE to push against table with sit to stands and to complete full ROM with curls and extentions. She was encouraged to stretch at home and do her HEP to minimize tightness and knee pain.   OBJECTIVE IMPAIRMENTS: Abnormal gait, decreased balance, decreased coordination, decreased endurance, decreased mobility, difficulty walking, decreased ROM, decreased strength, hypomobility, increased fascial restrictions, increased muscle spasms, impaired flexibility, improper body mechanics, postural dysfunction, and pain.   ACTIVITY LIMITATIONS: lifting, bending, standing, squatting, stairs, transfers, bed mobility, and locomotion level  PARTICIPATION LIMITATIONS: meal prep, cleaning, laundry, shopping, community activity, and yard work  PERSONAL FACTORS: Age, Fitness, Past/current experiences, and Time since onset of injury/illness/exacerbation are also affecting patient's functional outcome.   REHAB POTENTIAL: Good  CLINICAL DECISION MAKING: Evolving/moderate complexity  EVALUATION COMPLEXITY: Moderate   GOALS: Goals reviewed with patient? Yes  SHORT TERM  GOALS: Target date: 12/20/2023   Pt will be ind with initial HEP Baseline: Goal status: MET 12/16/23  2.  Pt will demo L = R figure 4 stretch for improved hip flexibility and ROM Baseline:  Goal status: MET 12/16/23  3.  Pt will be able to maintain increased trunk extension with standing and walking posture independently for improved stability Baseline: Trunk flexed ~10-20 deg Goal  status: INITIAL    LONG TERM GOALS: Target date: 01/17/2024   Pt will be ind with management and progression of HEP Baseline:  Goal status: INITIAL    2.  Pt will have improved 5x STS to </=13 sec to demo increased functional LE strength and decreased fall risk Baseline: 15.43 sec Goal status: 14.88 12/18/23  3.  Pt will have improved Berg Balance Score to >/=52 to demo MCID Baseline: 44 Goal status: Progressing 50/56  4.  Pt will have improved modified Oswestry score to </=30% to demo MCID Baseline: 40% Goal status: INITIAL  5.  Pt will report >/=50% improvement in overall pain Baseline:  Goal status: Ongoing 12/26/23   PLAN:  PT FREQUENCY: 2x/week  PT DURATION: 8 weeks  PLANNED INTERVENTIONS: 97164- PT Re-evaluation, 97110-Therapeutic exercises, 97530- Therapeutic activity, 97112- Neuromuscular re-education, 97535- Self Care, 16109- Manual therapy, [phone]- Gait training, [phone]- Aquatic Therapy, [phone]- Electrical stimulation (unattended), [phone]- Ionotophoresis 4mg /ml Dexamethasone , Patient/Family education, Balance training, Stair training, Taping, Dry Needling, Joint mobilization, Spinal mobilization, Cryotherapy, and Moist heat.  PLAN FOR NEXT SESSION: Advanced HEP. Continue to improve hip/lumbar mobility as tolerated. Initiate core and hip strengthening. Work on improving postural stability and balance.    Ollen Beverage, PTA, SPTA 12/26/2023, 10:57 AM

## 2023-12-30 DIAGNOSIS — M48062 Spinal stenosis, lumbar region with neurogenic claudication: Secondary | ICD-10-CM | POA: Diagnosis not present

## 2024-01-14 ENCOUNTER — Ambulatory Visit: Admitting: Physical Therapy

## 2024-01-14 DIAGNOSIS — G8929 Other chronic pain: Secondary | ICD-10-CM

## 2024-01-14 DIAGNOSIS — M6281 Muscle weakness (generalized): Secondary | ICD-10-CM | POA: Diagnosis not present

## 2024-01-14 DIAGNOSIS — Z79899 Other long term (current) drug therapy: Secondary | ICD-10-CM | POA: Diagnosis not present

## 2024-01-14 DIAGNOSIS — M0589 Other rheumatoid arthritis with rheumatoid factor of multiple sites: Secondary | ICD-10-CM | POA: Diagnosis not present

## 2024-01-14 DIAGNOSIS — R262 Difficulty in walking, not elsewhere classified: Secondary | ICD-10-CM | POA: Diagnosis not present

## 2024-01-14 DIAGNOSIS — M5442 Lumbago with sciatica, left side: Secondary | ICD-10-CM | POA: Diagnosis not present

## 2024-01-14 DIAGNOSIS — M5441 Lumbago with sciatica, right side: Secondary | ICD-10-CM | POA: Diagnosis not present

## 2024-01-14 DIAGNOSIS — R2681 Unsteadiness on feet: Secondary | ICD-10-CM | POA: Diagnosis not present

## 2024-01-14 NOTE — Therapy (Signed)
 OUTPATIENT PHYSICAL THERAPY THORACOLUMBAR TREATMENT   Patient Name: Joy [name] MRN: [id number] DOB:[birth year], 83 y.o., female Today's Date: 01/14/2024  END OF SESSION:  PT End of Session - 01/14/24 1003     Visit Number 10    Date for PT Re-Evaluation 01/17/24    Authorization Type Medicare    PT Start Time 1010    PT Stop Time 1055    PT Time Calculation (min) 45 min             Past Medical History:  Diagnosis Date   Ankylosing spondylitis (HCC)    Anxiety    Arthritis    RHEUMATOID   Back pain    Bronchitis    Constipation    COPD (chronic obstructive pulmonary disease) (HCC)    CXR 01/11/18 showed mild COPD and chronic bronchitis   CTS (carpal tunnel syndrome)    Dry eye    Dry mouth    Dysrhythmia    "irregularity" unknown at this time - being evaluated by cardiology   Essential hypertension 11/24/2015   GERD (gastroesophageal reflux disease)    HLA B27 (HLA B27 positive)    Hypercholesteremia    Hyperlipidemia 11/24/2015   Hypertension    IBS (irritable bowel syndrome)    Joint pain    Neuropathy    OAB (overactive bladder)    Obesity    Osteoarthritis    Osteoporosis    Pre-diabetes    Rheumatoid arthritis (HCC) 11/24/2015   Sciatica    Seasonal allergies    Spinal stenosis    Past Surgical History:  Procedure Laterality Date   ABDOMINAL HYSTERECTOMY  1995   BACK SURGERY     BREAST SURGERY     REDUCTION   CATARACT EXTRACTION W/PHACO  08/01/2012   Procedure: CATARACT EXTRACTION PHACO AND INTRAOCULAR LENS PLACEMENT (IOC);  Surgeon: Ben Bracken, MD;  Location: [facility] OR;  Service: Ophthalmology;  Laterality: Right;   EYE SURGERY Bilateral    cataract   HERNIA REPAIR     RIGHT ING.   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   Medication monitoring encounter 09/15/2021   Pain of left hip joint 08/10/2020   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   Prediabetes 02/01/2020   Body mass index (BMI) 36.0-36.9, adult 02/01/2020   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   Inflammation of sacroiliac joint (HCC) 10/06/2018   Chronic bronchitis (HCC) 09/25/2018   Mucopurulent chronic bronchitis (HCC) 08/21/2018   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   Spondylosis 06/13/2018   Status post lumbar spinal fusion 06/13/2018   Status post lumbar spine surgery for decompression of spinal cord 06/13/2018   History of total knee replacement, 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   History of total knee arthroplasty, right 09/20/2017   OA (osteoarthritis) of knee 12/26/2015   Essential hypertension 11/24/2015   Hyperlipidemia 11/24/2015   Rheumatoid arthritis (HCC) 11/24/2015  Spinal stenosis of lumbar region 08/24/2014   Lumbar spondylosis 07/21/2014    PCP: Darnelle Elders, PA-C  REFERRING PROVIDER: Esequiel Hector, PA-C   REFERRING DIAG: [phone] (ICD-10-CM) - Other spondylosis with radiculopathy, lumbar region   Rationale for Evaluation and Treatment: Rehabilitation  THERAPY DIAG:  Chronic bilateral low back pain with bilateral sciatica  Difficulty in walking, not elsewhere classified  Unsteadiness on feet  Muscle weakness (generalized)  ONSET DATE: Chronic   SUBJECTIVE:                                                                                                                                                                                            SUBJECTIVE STATEMENT "Right now I feel good, no pain"  I am good to wrap up today. Pain comes and goes but overall 40% better.   PERTINENT HISTORY:  3-4 back surgeries but no rods, RA, osteoporosis  PAIN:  Are you having pain? Not currently  PRECAUTIONS: Fall  RED FLAGS: None   WEIGHT BEARING RESTRICTIONS: No  FALLS:  Has patient fallen in last 6 months? Yes. Number of falls 1 - went to mailbox, turned to walk up hill and fell  LIVING ENVIRONMENT: Lives with: lives with their family (son and daughter) Lives in: House/apartment Stairs: Yes: Internal: 14 steps; on right going up Has following equipment at home: None  OCCUPATION: Retired; shopping, thinking about going to [employer], loves doing yard work but worried about falls  PLOF: Independent  PATIENT GOALS: Improve balance and back pain  NEXT MD VISIT: n/a  OBJECTIVE:  Note: Objective measures were completed at Evaluation unless otherwise noted.  DIAGNOSTIC FINDINGS:  Lumbar CT in 07/06/22 IMPRESSION: 1. Prior thoracolumbar fusion as detailed above. Unchanged lucency about the T11, L3, and S1 screws associated with absent solid arthrodesis at T11-12, L2-3, and L5-S1. 2. Suspected chronic severe left and moderate right neural foraminal stenosis at L5-S1. 3. Mild-to-moderate neural foraminal stenosis at L2-3. 4. Widely patent lumbar spinal canal following decompression. 5. Mild thoracic disc degeneration and advanced upper thoracic facet arthrosis without significant stenosis. 6.  Aortic Atherosclerosis (ICD10-I70.0).  PATIENT SURVEYS:  Modified Oswestry Low Back Pain Disability Questionnaire: 20 / 50 = 40.0 %  COGNITION: Overall cognitive status: Within functional limits for tasks assessed     SENSATION: WFL  MUSCLE LENGTH: Hamstrings: Right (Limited due to prior knee TKA); Left 80 deg deg Thomas test: did not  assess  POSTURE: decreased lumbar lordosis, increased thoracic kyphosis, and flexed trunk   PALPATION: TTP bilat lumbar paraspinals, L>R glute max and piriformis  LUMBAR ROM:   AROM eval 12/26/23  Flexion 80% WFL  Extension 10% Limited 50%  Right lateral flexion 1" above knee * Limited 25%  Left lateral flexion 1" above knee * Limited 50%  Right rotation 100% WNL  Left rotation 100% WFL   (Blank rows = not tested)  LOWER EXTREMITY ROM:     Active  Right eval Left eval  Hip flexion    Hip extension    Hip abduction    Hip adduction    Hip internal rotation    Hip external rotation    Knee flexion    Knee extension    Ankle dorsiflexion    Ankle plantarflexion    Ankle inversion    Ankle eversion     (Blank rows = not tested)  LOWER EXTREMITY MMT:    MMT Right eval Left eval  Hip flexion 4 4  Hip extension 3 3  Hip abduction 3+ 3+  Hip adduction    Hip internal rotation    Hip external rotation    Knee flexion 4 4  Knee extension 5 5  Ankle dorsiflexion 4- 4-  Ankle plantarflexion    Ankle inversion    Ankle eversion     (Blank rows = not tested)  LUMBAR SPECIAL TESTS:  Straight leg raise test: Negative, Single leg stance test: Positive, and FABER test: Positive  FUNCTIONAL TESTS:  5 times sit to stand: 15.43 sec Berg Balance Scale: 44/56     GAIT: Distance walked: Into clinic Assistive device utilized: Single point cane Level of assistance: Modified independence Comments: diminished bilat step length, forward flexed posture, trendelenburg bilat  TREATMENT DATE:   01/14/24 Nustep L 5 STS 5 x times STS with 3# 10 x HS curls 20lb 2x10 Leg Ext 5lb 2x10 Seated trunk ext 2 sets 10 6 inch step wiith opp leg ext UE support 10 x Red tband HHA hip flex,ext and abd 10 x BIL Foam beam side stepping HHA then tandem FWd and backward    12/26/23 NuStep L 5 x6 min GOALS  ROM  BERG 50/56 S2S LE on airex 2x10 Alt 6in box taps form airex  2x10 HS curls 20lb 2x10 Leg Ext 5lb 2x10   12/24/23 Gait with cane outside- Slant board stretch Black bar heel raise SLS with 6in box   HS,calf,  ITB stretch B legs   Nustep L5 Airex S2S 2x10  Assess goals 6in stairs step over step, forward and backward 2x10 6in box taps 2x20 taps Green band Rows 2x10 Bar//  Airex tap colors cones  Side step on beam  Tandem walk forward and backwards  Lateral step over color cones    12/16/23 Assess goals Nustep L5 Resisted gait 20# R Lateral step x8, 10# L Lateral steps x8- very unstable with on LLE  Red band resisted hip abd, ext, flex x10 each Red band Row 2x12 Yellow ball OH press 2x10  12/11/23 Nustep L5  S2S x10,  6in step up x10 each 4in lateral step x10 each Resisted gait 10# forward x10, lateral x5 each way Red band row 2x12 Yellow ball chest press x10, OH press x10 Walk outside w/o AD 518ft    12/09/23 Nustep L5 S2S x10- cue to avoid knees on the table Blue band HS curl x10 each leg 3# LAQ x10 each- R knee had some pain Blue band Rows 2x10     Bars  March in place on airex pad  Walk forward/backwards on airex mats 3x68ft  One foot  on airex, on foot on 6in box, head swivels x20sec each leg 6in step upsx10 each leg HS, ITB stretch B legs       12/04/23 NuStep L5 6in step up  x10 each- narrow BOS, uses UE when stepping with LLE, decreased step length 4in lateral step up x10 each  Blue band  Row 2x15 knee flex 2 x10 each LAQ x10 3#, each leg Bridges 2x10 HS stretch PROM hip flex Supine trunk rotations  11/27/23 NuStep L4 Sit to stand 2x10 emphasize hip ext and knee valgus Resisted shoulder ext. Red band 2x10 Rows red band 2x10 March In place 2x20 Tandem walking at counter top  Cross over steps   Side steps   11/22/23 See HEP below                                                                                                                                  PATIENT EDUCATION:  Education details: Exam findings, POC, initial HEP Person educated: Patient Education method: Explanation, Demonstration, and Handouts Education comprehension: verbalized understanding, returned demonstration, and needs further education  HOME EXERCISE PROGRAM: Access Code: [email or web address] URL: [email or web address] Date: 11/22/2023 Prepared by: Gellen April Erman [place]  Exercises - Seated Piriformis Stretch  - 1 x daily - 7 x weekly - 2 sets - 30 sec hold - Seated Hamstring Stretch  - 1 x daily - 7 x weekly - 2 sets - 30 sec hold - Right Standing Lateral Shift Correction at Wall - Hold  - 1 x daily - 7 x weekly - 2 sets - 30 sec hold - Standing Lumbar Extension at Wall - Forearms  - 1 x daily - 7 x weekly - 2 sets - 30 sec hold  ASSESSMENT:  CLINICAL IMPRESSION: She arrived ambulating with cane. No pain currently, stating pain comes and goes. Pt states overall she is 40% better and feels she is back to her baseline. Goals partially met and sh eis pleased with current level.  OBJECTIVE IMPAIRMENTS: Abnormal gait, decreased balance, decreased coordination, decreased endurance, decreased mobility, difficulty walking, decreased ROM, decreased strength, hypomobility, increased fascial restrictions, increased muscle spasms, impaired flexibility, improper body mechanics, postural dysfunction, and pain.   ACTIVITY LIMITATIONS: lifting, bending, standing, squatting, stairs, transfers, bed mobility, and locomotion level  PARTICIPATION LIMITATIONS: meal prep, cleaning, laundry, shopping, community activity, and yard work  PERSONAL FACTORS: Age, Fitness, Past/current experiences, and Time since onset of injury/illness/exacerbation are also affecting patient's functional outcome.   REHAB POTENTIAL: Good  CLINICAL DECISION MAKING: Evolving/moderate complexity  EVALUATION COMPLEXITY: Moderate   GOALS: Goals reviewed with patient? Yes  SHORT TERM GOALS:  Target date: 12/20/2023   Pt will be ind with initial HEP Baseline: Goal status: MET 12/16/23  2.  Pt will demo L = R figure 4 stretch for improved hip flexibility and ROM Baseline:  Goal status: MET 12/16/23  3.  Pt will be able to maintain increased  trunk extension with standing and walking posture independently for improved stability Baseline: Trunk flexed ~10-20 deg Goal status: 01/14/24 MET    LONG TERM GOALS: Target date: 01/17/2024   Pt will be ind with management and progression of HEP Baseline:  Goal status: 01/14/24 MET  2.  Pt will have improved 5x STS to </=13 sec to demo increased functional LE strength and decreased fall risk Baseline: 15.43 sec Goal status: 14.88 12/18/23   01/14/24 13.67 progressing  3.  Pt will have improved Berg Balance Score to >/=52 to demo MCID Baseline: 44 Goal status: Progressing 50/56  4.  Pt will have improved modified Oswestry score to </=30% to demo MCID Baseline: 40% Goal status: progressing 01/14/24  5.  Pt will report >/=50% improvement in overall pain Baseline:  Goal status: Ongoing 12/26/23   40% 01/14/24   PLAN:  PT FREQUENCY: 2x/week  PT DURATION: 8 weeks  PLANNED INTERVENTIONS: 97164- PT Re-evaluation, 97110-Therapeutic exercises, 97530- Therapeutic activity, 97112- Neuromuscular re-education, 97535- Self Care, 16109- Manual therapy, [phone]- Gait training, [phone]- Aquatic Therapy, [phone]- Electrical stimulation (unattended), [phone]- Ionotophoresis 4mg /ml Dexamethasone , Patient/Family education, Balance training, Stair training, Taping, Dry Needling, Joint mobilization, Spinal mobilization, Cryotherapy, and Moist heat.  PLAN FOR NEXT SESSION: D/C per pt request she feels she is at her baseline. Pt plans to [facility] [employer]    PHYSICAL THERAPY DISCHARGE SUMMARY   Patient agrees to discharge. Patient goals were partially met. Patient is being discharged due to being pleased with the current functional level.    Donavon Fudge, PT, DPT [place], PTA, SPTA 01/14/2024, 10:04 AM [facility] [address] [place], [place], [address] Phone: [phone]   Fax:  [phone]  Patient Details  Name: Joy [name] MRN: [id number] Date of Birth: [birth year] Referring Provider:  Darnelle Elders, PA-C  Encounter Date: 01/14/2024   Aquilla Bayley, PTA 01/14/2024, 10:04 AM  [facility] [address] [place], [place], [address] Phone: [phone]   Fax:  [phone]

## 2024-01-16 ENCOUNTER — Encounter: Admitting: Physical Therapy

## 2024-01-23 DIAGNOSIS — R7989 Other specified abnormal findings of blood chemistry: Secondary | ICD-10-CM | POA: Diagnosis not present

## 2024-01-23 DIAGNOSIS — R2681 Unsteadiness on feet: Secondary | ICD-10-CM | POA: Diagnosis not present

## 2024-01-23 DIAGNOSIS — G44209 Tension-type headache, unspecified, not intractable: Secondary | ICD-10-CM | POA: Diagnosis not present

## 2024-01-23 DIAGNOSIS — N644 Mastodynia: Secondary | ICD-10-CM | POA: Diagnosis not present

## 2024-01-23 DIAGNOSIS — G629 Polyneuropathy, unspecified: Secondary | ICD-10-CM | POA: Diagnosis not present

## 2024-01-23 DIAGNOSIS — G8929 Other chronic pain: Secondary | ICD-10-CM | POA: Diagnosis not present

## 2024-01-24 ENCOUNTER — Other Ambulatory Visit: Payer: Self-pay | Admitting: Family Medicine

## 2024-01-24 DIAGNOSIS — N644 Mastodynia: Secondary | ICD-10-CM

## 2024-01-24 DIAGNOSIS — R2681 Unsteadiness on feet: Secondary | ICD-10-CM

## 2024-02-10 DIAGNOSIS — Z1231 Encounter for screening mammogram for malignant neoplasm of breast: Secondary | ICD-10-CM | POA: Diagnosis not present

## 2024-02-11 ENCOUNTER — Ambulatory Visit: Attending: Family Medicine | Admitting: Physical Therapy

## 2024-02-11 ENCOUNTER — Other Ambulatory Visit: Payer: Self-pay

## 2024-02-11 ENCOUNTER — Encounter: Payer: Self-pay | Admitting: Physical Therapy

## 2024-02-11 DIAGNOSIS — R279 Unspecified lack of coordination: Secondary | ICD-10-CM | POA: Insufficient documentation

## 2024-02-11 DIAGNOSIS — M5442 Lumbago with sciatica, left side: Secondary | ICD-10-CM | POA: Insufficient documentation

## 2024-02-11 DIAGNOSIS — M5441 Lumbago with sciatica, right side: Secondary | ICD-10-CM | POA: Diagnosis not present

## 2024-02-11 DIAGNOSIS — R2681 Unsteadiness on feet: Secondary | ICD-10-CM | POA: Insufficient documentation

## 2024-02-11 DIAGNOSIS — G8929 Other chronic pain: Secondary | ICD-10-CM | POA: Insufficient documentation

## 2024-02-11 DIAGNOSIS — R262 Difficulty in walking, not elsewhere classified: Secondary | ICD-10-CM | POA: Insufficient documentation

## 2024-02-11 DIAGNOSIS — R293 Abnormal posture: Secondary | ICD-10-CM | POA: Insufficient documentation

## 2024-02-11 DIAGNOSIS — M6281 Muscle weakness (generalized): Secondary | ICD-10-CM | POA: Diagnosis not present

## 2024-02-11 NOTE — Therapy (Signed)
 OUTPATIENT PHYSICAL THERAPY LOWER EXTREMITY EVALUATION   Patient Name: Joy [name] MRN: [id number] DOB:30-Nov-1940, 83 y.o., female Today's Date: 02/11/2024  END OF SESSION:  PT End of Session - 02/11/24 1410     Visit Number 1    Date for PT Re-Evaluation 03/24/24    Authorization Type MCR and [employer]    Authorization Time Period 02/11/24 to 03/24/24    Progress Note Due on Visit 10    PT Start Time 1347    PT Stop Time 1425    PT Time Calculation (min) 38 min    Activity Tolerance Patient tolerated treatment well    Behavior During Therapy [facility] for tasks assessed/performed          Past Medical History:  Diagnosis Date   Ankylosing spondylitis (HCC)    Anxiety    Arthritis    RHEUMATOID   Back pain    Bronchitis    Constipation    COPD (chronic obstructive pulmonary disease) (HCC)    CXR 01/11/18 showed mild COPD and chronic bronchitis   CTS (carpal tunnel syndrome)    Dry eye    Dry mouth    Dysrhythmia    irregularity unknown at this time - being evaluated by cardiology   Essential hypertension 11/24/2015   GERD (gastroesophageal reflux disease)    HLA B27 (HLA B27 positive)    Hypercholesteremia    Hyperlipidemia 11/24/2015   Hypertension    IBS (irritable bowel syndrome)    Joint pain    Neuropathy    OAB (overactive bladder)    Obesity    Osteoarthritis    Osteoporosis    Pre-diabetes    Rheumatoid arthritis (HCC) 11/24/2015   Sciatica    Seasonal allergies    Spinal stenosis    Past Surgical History:  Procedure Laterality Date   ABDOMINAL HYSTERECTOMY  1995   BACK SURGERY     BREAST SURGERY     REDUCTION   CATARACT EXTRACTION W/PHACO  08/01/2012   Procedure: CATARACT EXTRACTION PHACO AND INTRAOCULAR LENS PLACEMENT (IOC);  Surgeon: Gaither Quan, MD;  Location: [facility] OR;  Service: Ophthalmology;  Laterality: Right;   EYE SURGERY Bilateral    cataract   HERNIA REPAIR     RIGHT ING.   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   Medication monitoring encounter 09/15/2021   Pain of left hip joint 08/10/2020   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   Prediabetes 02/01/2020   Body mass index (BMI) 36.0-36.9, adult 02/01/2020   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   Inflammation of sacroiliac joint (HCC) 10/06/2018   Chronic bronchitis (HCC) 09/25/2018   Mucopurulent chronic bronchitis (HCC) 08/21/2018   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   Spondylosis 06/13/2018   Status post lumbar spinal fusion 06/13/2018   Status post lumbar spine surgery for decompression of spinal cord 06/13/2018   History  of total knee replacement, 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   History of total knee arthroplasty, right 09/20/2017   OA (osteoarthritis) of knee 12/26/2015   Essential hypertension 11/24/2015   Hyperlipidemia 11/24/2015   Rheumatoid arthritis (HCC) 11/24/2015   Spinal stenosis of lumbar region 08/24/2014   Lumbar spondylosis 07/21/2014    PCP: Katina Pfeiffer PA-C  REFERRING PROVIDER: Katina Pfeiffer, PA-C  REFERRING DIAG:  Diagnosis  R26.81 (ICD-10-CM) - Unsteadiness on feet    THERAPY DIAG:  Chronic bilateral low back pain with bilateral sciatica  Difficulty in walking, not elsewhere classified  Unsteadiness on feet  Muscle weakness (generalized)  Unspecified lack of  coordination  Rationale for Evaluation and Treatment: Rehabilitation  ONSET DATE: chronic   SUBJECTIVE:   SUBJECTIVE STATEMENT:  I was just here and DCed in May, now back with a new referral. My balance is really bad and I have sciatica in my left leg. Need a TKR on the other side. Had two falls in the past month or so. Missed a step one time, other time I was inside the house and tripped in my house shoes. Its basically the same thing I came here with as last time, I didn't keep up my HEP from last round of PT.   PERTINENT HISTORY: See above  PAIN:  Are you having pain? Zero and a half, I'm good   PRECAUTIONS: None  RED FLAGS: None   WEIGHT BEARING RESTRICTIONS: No  FALLS:  Has patient fallen in last 6 months? Yes. 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    PATIENT SURVEYS:   THE PATIENT SPECIFIC FUNCTIONAL SCALE  Place score of 0-10 (0 = unable to perform activity and 10 = able to perform activity at the same level as before injury or problem)  Activity Date: Eval 02/11/24    Stairs  4    2. Yard work  2    3. Balance  3    4.      Total Score 3      Total Score = Sum of activity scores/number of activities  Minimally Detectable Change: 3 points (for single activity); 2 points (for average score)  [facility] (nd). The Patient Specific Functional Scale . Retrieved from [email or web address]   COGNITION: Overall cognitive status: mild impairments in  cognition and STM noted, endorses forgetting familiar routes when driving            LOWER EXTREMITY MMT:  MMT Right eval Left eval  Hip flexion 3+ 4  Hip extension    Hip abduction 4 4  Hip adduction    Hip internal rotation    Hip external rotation    Knee flexion 4+ 4+  Knee extension 4+ 4+  Ankle dorsiflexion    Ankle plantarflexion    Ankle inversion    Ankle eversion     (Blank rows = not tested)    FUNCTIONAL TESTS:  5 times sit to stand: 23 seconds no UEs  3 minute walk test: 435ft no device  Dynamic Gait Index: Dynamic Gait Index  Mark the lowest level  that applies.   Date Performed 02/11/24  Gait level surface (2) Mild Impairment: Walks 20', uses AD, slower speed, mild gait deviations  2. Change in gait speed (2) Mild Impairment: Is able to change speed but demonstrates mild gait deviations, or not gait deviations but unable to achieve a significant change in velocity, or uses an assistive device  3. Gait with horizontal head turns (2) Mild Impairment: Performs head turns smoothly with slight change in gait velocity, i.e., minor disruption to smooth gait path or uses walking aid  4. Gait with vertical head turns (2) Mild Impairment: Performs head turns smoothly with slight change in gait velocity, i.e., minor disruption to smooth gait path or uses walking aid  5. Gait and pivot turn (1) Moderate Impairment: Turns slowly, requires verbal cueing, requires several small steps to catch balance following turn and stop  6. Step over obstacle (2) Mild Impairment: Is able to step over box, but must slow down and adjust steps to clear box safely  7. Step around obstacle (2) Mild Impairment: Is able to step around both cones, but must slow down and adjust steps to clear cones  8. Steps (0) Severe Impairment: Cannot do safely  Total score 13    Score Interpretation: Score of <19 indicates high risk of falls.  Minimally Clinically Important Difference (MCID):  =DGI  scores of<21/24 = 1.80 points DGI scores of >21/24 = 0.60 points   [place] T, Inbar-Borovsky N, Brozgol M, Giladi N, [place] JM. The Dynamic Gait Index in healthy older adults: the role of stair climbing, fear of falling and gender. Gait Posture. 2009 Feb;29(2):237-41. doi: 10.1016/j.gaitpost.2008.08.013. Epub 2008 Oct 8. PMID: 81154560; PMCID: [id number].  Pardasaney, MYRTIS LOIS Bonus, GEANNIE POUR., et al. (2012). Sensitivity to change and responsiveness of four balance measures for community-dwelling older adults. Physical therapy 92(3): 388-397.   GAIT: Distance walked: 459ft Assistive device utilized: None Level of assistance: SBA Comments: easily fatigued, on and off min guard on corners for balance but completed most of distance with SBA                                                                                                                                 TREATMENT DATE:   02/11/24  HEP, POC, review of past HEP, education as below   Nustep  L5x8 minutes all four extremities seat 8   PATIENT EDUCATION:  Education details: exam findings, POC, HEP, lots of encouragement to get more active and goal of PT being to transition to long term independent gym program  Person educated: Patient Education method: Explanation, Demonstration, and Handouts Education comprehension: verbalized understanding, returned demonstration, and needs further education  HOME EXERCISE PROGRAM: 2nd visit   ASSESSMENT:  CLINICAL IMPRESSION: Patient is a 83 y.o. F who was seen today for physical therapy evaluation and treatment for  Diagnosis  R26.81 (ICD-10-CM) - Unsteadiness on feet  . She is very well known to this  clinic, and was just discharged about a month ago. She has not been compliant with HEP. Will attempt to improve balance and function with ultimate goal of transition to independent exercise program.   OBJECTIVE IMPAIRMENTS: Abnormal gait, decreased activity tolerance, decreased balance,  decreased cognition, decreased knowledge of use of DME, decreased mobility, difficulty walking, decreased strength, decreased safety awareness, and pain.   ACTIVITY LIMITATIONS: carrying, lifting, standing, stairs, transfers, and locomotion level  PARTICIPATION LIMITATIONS: driving, shopping, community activity, and yard work  PERSONAL FACTORS: Age, Behavior pattern, Education, Fitness, Past/current experiences, Social background, and Time since onset of injury/illness/exacerbation are also affecting patient's functional outcome.   REHAB POTENTIAL: Fair return to PT after being DCed about a month ago, poor compliance with HEP and sedentary lifestyle   CLINICAL DECISION MAKING: Stable/uncomplicated  EVALUATION COMPLEXITY: Low   GOALS: Goals reviewed with patient? No  SHORT TERM GOALS: Target date: 03/03/2024   Will be compliant with appropriate progressive HEP  Baseline: Goal status: INITIAL  2.  Will name 3 ways to reduce fall risk at home and in the community  Baseline:  Goal status: INITIAL  3.  Will complete 5xSTS in 15 seconds or less  Baseline:  Goal status: INITIAL    LONG TERM GOALS: Target date: 03/24/2024    MMT to improve by one grade in all weak groups  Baseline:  Goal status: INITIAL  2.  Will score at least 18 on DGI to show reduced fall risk  Baseline:  Goal status: INITIAL  3.  Will ambulate at least 658ft in with minimal fatigue to show improved community access and activity tolerance  Baseline:  Goal status: INITIAL  4.  Pain to generally be no more than 5/10 at worst  Baseline:  Goal status: INITIAL  5.  PSFS to improve by 2 points  Baseline:  Goal status: INITIAL  6.  Will be compliant with appropriate advanced HEP vs group gym programming  Baseline:  Goal status: INITIAL   PLAN:  PT FREQUENCY: 2x/week  PT DURATION: 6 weeks  PLANNED INTERVENTIONS: 97750- Physical Performance Testing, 97110-Therapeutic exercises, 97530-  Therapeutic activity, [id number]- Neuromuscular re-education, 97535- Self Care, [id number]- Manual therapy, and [id number]- Gait training  PLAN FOR NEXT SESSION: functional activity tolerance, strength, balance, encourage gym classes/group classes for activity on her own. Still needs updated HEP- 2nd visit    Josette Rough, PT, DPT 02/11/24 2:28 PM

## 2024-02-13 ENCOUNTER — Ambulatory Visit: Admitting: Physical Therapy

## 2024-02-13 ENCOUNTER — Encounter: Payer: Self-pay | Admitting: Physical Therapy

## 2024-02-13 DIAGNOSIS — R293 Abnormal posture: Secondary | ICD-10-CM

## 2024-02-13 DIAGNOSIS — M5441 Lumbago with sciatica, right side: Secondary | ICD-10-CM | POA: Diagnosis not present

## 2024-02-13 DIAGNOSIS — M6281 Muscle weakness (generalized): Secondary | ICD-10-CM

## 2024-02-13 DIAGNOSIS — R262 Difficulty in walking, not elsewhere classified: Secondary | ICD-10-CM | POA: Diagnosis not present

## 2024-02-13 DIAGNOSIS — M5442 Lumbago with sciatica, left side: Secondary | ICD-10-CM | POA: Diagnosis not present

## 2024-02-13 DIAGNOSIS — G8929 Other chronic pain: Secondary | ICD-10-CM

## 2024-02-13 DIAGNOSIS — R2681 Unsteadiness on feet: Secondary | ICD-10-CM | POA: Diagnosis not present

## 2024-02-13 NOTE — Therapy (Signed)
 OUTPATIENT PHYSICAL THERAPY LOWER EXTREMITY TREATMENT   Patient Name: Joy [name] MRN: [id number] DOB:Apr 14, 1941, 83 y.o., female Today's Date: 02/13/2024  END OF SESSION:  PT End of Session - 02/13/24 1109     Visit Number 2    Date for PT Re-Evaluation 03/24/24    Authorization Type MCR and [employer]    PT Start Time 1057    PT Stop Time 1145    PT Time Calculation (min) 48 min    Activity Tolerance Patient tolerated treatment well    Behavior During Therapy WFL for tasks assessed/performed          Past Medical History:  Diagnosis Date   Ankylosing spondylitis (HCC)    Anxiety    Arthritis    RHEUMATOID   Back pain    Bronchitis    Constipation    COPD (chronic obstructive pulmonary disease) (HCC)    CXR 01/11/18 showed mild COPD and chronic bronchitis   CTS (carpal tunnel syndrome)    Dry eye    Dry mouth    Dysrhythmia    irregularity unknown at this time - being evaluated by cardiology   Essential hypertension 11/24/2015   GERD (gastroesophageal reflux disease)    HLA B27 (HLA B27 positive)    Hypercholesteremia    Hyperlipidemia 11/24/2015   Hypertension    IBS (irritable bowel syndrome)    Joint pain    Neuropathy    OAB (overactive bladder)    Obesity    Osteoarthritis    Osteoporosis    Pre-diabetes    Rheumatoid arthritis (HCC) 11/24/2015   Sciatica    Seasonal allergies    Spinal stenosis    Past Surgical History:  Procedure Laterality Date   ABDOMINAL HYSTERECTOMY  1995   BACK SURGERY     BREAST SURGERY     REDUCTION   CATARACT EXTRACTION W/PHACO  08/01/2012   Procedure: CATARACT EXTRACTION PHACO AND INTRAOCULAR LENS PLACEMENT (IOC);  Surgeon: Gaither Quan, MD;  Location: [facility] OR;  Service: Ophthalmology;  Laterality: Right;   EYE SURGERY Bilateral    cataract   HERNIA REPAIR     RIGHT ING.   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   Medication monitoring encounter 09/15/2021   Pain of left hip joint 08/10/2020   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   Prediabetes 02/01/2020   Body mass index (BMI) 36.0-36.9, adult 02/01/2020   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   Inflammation of sacroiliac joint (HCC) 10/06/2018   Chronic bronchitis (HCC) 09/25/2018   Mucopurulent chronic bronchitis (HCC) 08/21/2018   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   Spondylosis 06/13/2018   Status post lumbar spinal fusion 06/13/2018   Status post lumbar spine surgery for decompression of spinal cord 06/13/2018   History of total knee replacement, 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   History of total knee arthroplasty, right 09/20/2017   OA (  osteoarthritis) of knee 12/26/2015   Essential hypertension 11/24/2015   Hyperlipidemia 11/24/2015   Rheumatoid arthritis (HCC) 11/24/2015   Spinal stenosis of lumbar region 08/24/2014   Lumbar spondylosis 07/21/2014    PCP: Katina Pfeiffer PA-C  REFERRING PROVIDER: Katina Pfeiffer, PA-C  REFERRING DIAG:  Diagnosis  R26.81 (ICD-10-CM) - Unsteadiness on feet    THERAPY DIAG:  Chronic bilateral low back pain with bilateral sciatica  Difficulty in walking, not elsewhere classified  Unsteadiness on feet  Muscle weakness (generalized)  Abnormal posture  Rationale for Evaluation and Treatment: Rehabilitation  ONSET DATE: chronic    SUBJECTIVE:   SUBJECTIVE STATEMENT: Doing okay, knees are a little sore.  I was just here and DCed in May, now back with a new referral. My balance is really bad and I have sciatica in my left leg. Need a TKR on the other side. Had two falls in the past month or so. Missed a step one time, other time I was inside the house and tripped in my house shoes. Its basically the same thing I came here with as last time, I didn't keep up my HEP from last round of PT.   PERTINENT HISTORY: See above  PAIN:  Are you having pain? Zero and a half, I'm good   PRECAUTIONS: None  RED FLAGS: None   WEIGHT BEARING RESTRICTIONS: No  FALLS:  Has patient fallen in last 6 months? Yes. 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    PATIENT SURVEYS:   THE PATIENT SPECIFIC FUNCTIONAL SCALE  Place score of 0-10 (0 = unable to perform activity and 10 = able to perform activity at the same level as before injury or problem)  Activity Date: Eval 02/11/24    Stairs  4    2. Yard work  2    3. Balance  3    4.      Total Score 3      Total Score = Sum of activity scores/number of activities  Minimally Detectable Change: 3 points (for single activity); 2 points (for average score)  [facility] (nd). The Patient Specific Functional Scale . Retrieved from [email or web address]   COGNITION: Overall cognitive status: mild impairments in cognition and STM noted, endorses forgetting familiar routes  when driving            LOWER EXTREMITY MMT:  MMT Right eval Left eval  Hip flexion 3+ 4  Hip extension    Hip abduction 4 4  Hip adduction    Hip internal rotation    Hip external rotation    Knee flexion 4+ 4+  Knee extension 4+ 4+  Ankle dorsiflexion    Ankle plantarflexion    Ankle inversion    Ankle eversion     (Blank rows = not tested)    FUNCTIONAL TESTS:  5 times sit to stand: 23 seconds no UEs  3 minute walk test: 441ft no device  Dynamic Gait Index: Dynamic Gait Index  Mark the lowest level that applies.   Date Performed 02/11/24  Gait level surface (2) Mild  Impairment: Walks 20', uses AD, slower speed, mild gait deviations  2. Change in gait speed (2) Mild Impairment: Is able to change speed but demonstrates mild gait deviations, or not gait deviations but unable to achieve a significant change in velocity, or uses an assistive device  3. Gait with horizontal head turns (2) Mild Impairment: Performs head turns smoothly with slight change in gait velocity, i.e., minor disruption to smooth gait path or uses walking aid  4. Gait with vertical head turns (2) Mild Impairment: Performs head turns smoothly with slight change in gait velocity, i.e., minor disruption to smooth gait path or uses walking aid  5. Gait and pivot turn (1) Moderate Impairment: Turns slowly, requires verbal cueing, requires several small steps to catch balance following turn and stop  6. Step over obstacle (2) Mild Impairment: Is able to step over box, but must slow down and adjust steps to clear box safely  7. Step around obstacle (2) Mild Impairment: Is able to step around both cones, but must slow down and adjust steps to clear cones  8. Steps (0) Severe Impairment: Cannot do safely  Total score 13    Score Interpretation: Score of <19 indicates high risk of falls.  Minimally Clinically Important Difference (MCID):  =DGI scores of<21/24 = 1.80 points DGI scores of >21/24 = 0.60  points   Alexander T, Inbar-Borovsky N, Brozgol M, Giladi N, [place] JM. The Dynamic Gait Index in healthy older adults: the role of stair climbing, fear of falling and gender. Gait Posture. 2009 Feb;29(2):237-41. doi: 10.1016/j.gaitpost.2008.08.013. Epub 2008 Oct 8. PMID: 81154560; PMCID: EFR7290501.  Pardasaney, MYRTIS LOIS Bonus, GEANNIE POUR., et al. (2012). Sensitivity to change and responsiveness of four balance measures for community-dwelling older adults. Physical therapy 92(3): 388-397.   GAIT: Distance walked: 472ft Assistive device utilized: None Level of assistance: SBA Comments: easily fatigued, on and off min guard on corners for balance but completed most of distance with SBA                                                                                                                                 TREATMENT DATE:  02/13/24 Nustep level 4 x 6 minutes 2.5# marches 2.5# hip abduction 2.5# hip extension On airex reaching Side step on and off aires On airex ball toss 15# HS curls 2x10 5# leg extension 2x6 Feet on ball K2C, rotation, small bridge, isometric abs Passive stretch HS and piriformis  02/11/24  HEP, POC, review of past HEP, education as below   Nustep  L5x8 minutes all four extremities seat 8   PATIENT EDUCATION:  Education details: exam findings, POC, HEP, lots of encouragement to get more active and goal of PT being to transition to long term independent gym program  Person educated: Patient Education method: Explanation, Demonstration, and Handouts Education comprehension: verbalized understanding, returned demonstration, and needs further education  HOME EXERCISE PROGRAM: 2nd visit   ASSESSMENT:  CLINICAL IMPRESSION: Patient doing okay, she reports some sciatic pain, some knee pain and some left shoulder pain.  I initiated some strength, some balance and some flexibility, she needs guidance and cues for posture, core and the activity.  Tended to want to  hold on doing the balance activities.  The left HS and piriformis are tight and painful  Patient is a 83 y.o. F who was seen today for physical therapy evaluation and treatment for  Diagnosis  R26.81 (ICD-10-CM) - Unsteadiness on feet  . She is very well known to this clinic, and was just discharged about a month ago. She has not been compliant with HEP. Will attempt to improve balance and function with ultimate goal of transition to independent exercise program.   OBJECTIVE IMPAIRMENTS: Abnormal gait, decreased activity tolerance, decreased balance, decreased cognition, decreased knowledge of use of DME, decreased mobility, difficulty walking, decreased strength, decreased safety awareness, and pain.   ACTIVITY LIMITATIONS: carrying, lifting, standing, stairs, transfers, and locomotion level  PARTICIPATION LIMITATIONS: driving, shopping, community activity, and yard work  PERSONAL FACTORS: Age, Behavior pattern, Education, Fitness, Past/current experiences, Social background, and Time since onset of injury/illness/exacerbation are also affecting patient's functional outcome.   REHAB POTENTIAL: Fair return to PT after being DCed about a month ago, poor compliance with HEP and sedentary lifestyle   CLINICAL DECISION MAKING: Stable/uncomplicated  EVALUATION COMPLEXITY: Low   GOALS: Goals reviewed with patient? No  SHORT TERM GOALS: Target date: 03/03/2024   Will be compliant with appropriate progressive HEP  Baseline: Goal status: INITIAL  2.  Will name 3 ways to reduce fall risk at home and in the community  Baseline:  Goal status: INITIAL  3.  Will complete 5xSTS in 15 seconds or less  Baseline:  Goal status: INITIAL    LONG TERM GOALS: Target date: 03/24/2024    MMT to improve by one grade in all weak groups  Baseline:  Goal status: INITIAL  2.  Will score at least 18 on DGI to show reduced fall risk  Baseline:  Goal status: INITIAL  3.  Will ambulate at least 681ft  in with minimal fatigue to show improved community access and activity tolerance  Baseline:  Goal status: INITIAL  4.  Pain to generally be no more than 5/10 at worst  Baseline:  Goal status: INITIAL  5.  PSFS to improve by 2 points  Baseline:  Goal status: INITIAL  6.  Will be compliant with appropriate advanced HEP vs group gym programming  Baseline:  Goal status: INITIAL   PLAN:  PT FREQUENCY: 2x/week  PT DURATION: 6 weeks  PLANNED INTERVENTIONS: 97750- Physical Performance Testing, 97110-Therapeutic exercises, 97530- Therapeutic activity, [id number]- Neuromuscular re-education, 97535- Self Care, [id number]- Manual therapy, and [id number]- Gait training  PLAN FOR NEXT SESSION: functional activity tolerance, strength, balance, encourage gym classes/group classes for activity on her own. Will need to review the HEP and see what works for her, may have to streamline it   Ozell Mainland, PT 02/13/24 11:11 AM

## 2024-02-16 DIAGNOSIS — R051 Acute cough: Secondary | ICD-10-CM | POA: Diagnosis not present

## 2024-02-16 DIAGNOSIS — J440 Chronic obstructive pulmonary disease with acute lower respiratory infection: Secondary | ICD-10-CM | POA: Diagnosis not present

## 2024-02-16 DIAGNOSIS — J209 Acute bronchitis, unspecified: Secondary | ICD-10-CM | POA: Diagnosis not present

## 2024-02-17 ENCOUNTER — Ambulatory Visit: Admitting: Physical Therapy

## 2024-02-20 ENCOUNTER — Ambulatory Visit: Attending: Family Medicine | Admitting: Physical Therapy

## 2024-02-20 DIAGNOSIS — R2681 Unsteadiness on feet: Secondary | ICD-10-CM | POA: Insufficient documentation

## 2024-02-20 DIAGNOSIS — R262 Difficulty in walking, not elsewhere classified: Secondary | ICD-10-CM | POA: Diagnosis not present

## 2024-02-20 DIAGNOSIS — M5441 Lumbago with sciatica, right side: Secondary | ICD-10-CM | POA: Insufficient documentation

## 2024-02-20 DIAGNOSIS — M6281 Muscle weakness (generalized): Secondary | ICD-10-CM | POA: Insufficient documentation

## 2024-02-20 DIAGNOSIS — G8929 Other chronic pain: Secondary | ICD-10-CM | POA: Insufficient documentation

## 2024-02-20 DIAGNOSIS — M5442 Lumbago with sciatica, left side: Secondary | ICD-10-CM | POA: Insufficient documentation

## 2024-02-20 NOTE — Therapy (Signed)
 OUTPATIENT PHYSICAL THERAPY LOWER EXTREMITY TREATMENT   Patient Name: Joy [name] MRN: [id number] DOB:Nov 19, 1940, 83 y.o., female Today's Date: 02/20/2024  END OF SESSION:  PT End of Session - 02/20/24 1142     Visit Number 3    Date for PT Re-Evaluation 03/24/24    Authorization Type MCR and [employer]    Authorization Time Period 02/11/24 to 03/24/24    PT Start Time 1140    PT Stop Time 1225    PT Time Calculation (min) 45 min          Past Medical History:  Diagnosis Date   Ankylosing spondylitis (HCC)    Anxiety    Arthritis    RHEUMATOID   Back pain    Bronchitis    Constipation    COPD (chronic obstructive pulmonary disease) (HCC)    CXR 01/11/18 showed mild COPD and chronic bronchitis   CTS (carpal tunnel syndrome)    Dry eye    Dry mouth    Dysrhythmia    irregularity unknown at this time - being evaluated by cardiology   Essential hypertension 11/24/2015   GERD (gastroesophageal reflux disease)    HLA B27 (HLA B27 positive)    Hypercholesteremia    Hyperlipidemia 11/24/2015   Hypertension    IBS (irritable bowel syndrome)    Joint pain    Neuropathy    OAB (overactive bladder)    Obesity    Osteoarthritis    Osteoporosis    Pre-diabetes    Rheumatoid arthritis (HCC) 11/24/2015   Sciatica    Seasonal allergies    Spinal stenosis    Past Surgical History:  Procedure Laterality Date   ABDOMINAL HYSTERECTOMY  1995   BACK SURGERY     BREAST SURGERY     REDUCTION   CATARACT EXTRACTION W/PHACO  08/01/2012   Procedure: CATARACT EXTRACTION PHACO AND INTRAOCULAR LENS PLACEMENT (IOC);  Surgeon: Gaither Quan, MD;  Location: [facility] OR;  Service: Ophthalmology;  Laterality: Right;   EYE SURGERY Bilateral    cataract   HERNIA REPAIR     RIGHT ING.   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   Medication monitoring encounter 09/15/2021   Pain of left hip joint 08/10/2020   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   Prediabetes 02/01/2020   Body mass index (BMI) 36.0-36.9, adult 02/01/2020   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   Inflammation of sacroiliac joint (HCC) 10/06/2018   Chronic bronchitis (HCC) 09/25/2018   Mucopurulent chronic bronchitis (HCC) 08/21/2018   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   Spondylosis 06/13/2018   Status post lumbar spinal fusion 06/13/2018   Status post lumbar spine surgery for decompression of spinal cord 06/13/2018   History of total knee replacement, 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   History of total knee arthroplasty, right 09/20/2017   OA (osteoarthritis) of knee 12/26/2015   Essential hypertension 11/24/2015  Hyperlipidemia 11/24/2015   Rheumatoid arthritis (HCC) 11/24/2015   Spinal stenosis of lumbar region 08/24/2014   Lumbar spondylosis 07/21/2014    PCP: Katina Pfeiffer PA-C  REFERRING PROVIDER: Katina Pfeiffer, PA-C  REFERRING DIAG:  Diagnosis  R26.81 (ICD-10-CM) - Unsteadiness on feet    THERAPY DIAG:  Chronic bilateral low back pain with bilateral sciatica  Difficulty in walking, not elsewhere classified  Unsteadiness on feet  Muscle weakness (generalized)  Rationale for Evaluation and Treatment: Rehabilitation  ONSET DATE: chronic   SUBJECTIVE:   SUBJECTIVE STATEMENT: sick over weekend with COPD- doing better  now    I was just here and DCed in May, now back with a new referral. My balance is really bad and I have sciatica in my left leg. Need a TKR on the other side. Had two falls in the past month or so. Missed a step one time, other time I was inside the house and tripped in my house shoes. Its basically the same thing I came here with as last time, I didn't keep up my HEP from last round of PT.   PERTINENT HISTORY: See above  PAIN:  Are you having pain? Zero and a half, I'm good   PRECAUTIONS: None  RED FLAGS: None   WEIGHT BEARING RESTRICTIONS: No  FALLS:  Has patient fallen in last 6 months? Yes. 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    PATIENT SURVEYS:   THE PATIENT SPECIFIC FUNCTIONAL SCALE  Place score of 0-10 (0 = unable to perform activity and 10 = able to perform activity at the same level as before injury or problem)  Activity Date: Eval 02/11/24    Stairs  4    2. Yard work  2    3. Balance  3    4.      Total Score 3      Total Score = Sum of activity scores/number of activities  Minimally Detectable Change: 3 points (for single activity); 2 points (for average score)  [facility] (nd). The Patient Specific Functional Scale . Retrieved from [email or web address]   COGNITION: Overall cognitive status: mild impairments in cognition and STM noted, endorses forgetting familiar routes when driving            LOWER EXTREMITY MMT:  MMT Right eval  Left eval  Hip flexion 3+ 4  Hip extension    Hip abduction 4 4  Hip adduction    Hip internal rotation    Hip external rotation    Knee flexion 4+ 4+  Knee extension 4+ 4+  Ankle dorsiflexion    Ankle plantarflexion    Ankle inversion    Ankle eversion     (Blank rows = not tested)    FUNCTIONAL TESTS:  5 times sit to stand: 23 seconds no UEs  3 minute walk test: 422ft no device  Dynamic Gait Index: Dynamic Gait Index  Mark the lowest level that applies.   Date Performed 02/11/24  Gait level surface (2) Mild Impairment: Walks 20', uses AD, slower speed, mild gait deviations  2. Change in gait speed (2) Mild Impairment: Is able to change speed but demonstrates mild gait deviations, or not gait deviations but unable to achieve a significant change in velocity, or uses an assistive device  3. Gait with horizontal head turns (2) Mild Impairment: Performs head turns smoothly with slight change in gait velocity, i.e., minor disruption to smooth gait path or uses walking aid  4. Gait with vertical head turns (2) Mild Impairment: Performs head turns smoothly with slight change in gait velocity, i.e., minor disruption to smooth gait path or uses walking aid  5. Gait and pivot turn (1) Moderate Impairment: Turns slowly, requires verbal cueing, requires several small steps to catch balance following turn and stop  6. Step over obstacle (2) Mild Impairment: Is able to step over box, but must slow down and adjust steps to clear box safely  7. Step around obstacle (2) Mild Impairment: Is able to step around both cones, but must slow down and adjust steps to clear cones  8. Steps (0) Severe Impairment: Cannot do safely  Total score 13    Score Interpretation: Score of <19 indicates high risk of falls.  Minimally Clinically Important Difference (MCID):  =DGI scores of<21/24 = 1.80 points DGI scores of >21/24 = 0.60 points   Roby T, Inbar-Borovsky N, Brozgol M, Giladi N, [place] JM.  The Dynamic Gait Index in healthy older adults: the role of stair climbing, fear of falling and gender. Gait Posture. 2009 Feb;29(2):237-41. doi: 10.1016/j.gaitpost.2008.08.013. Epub 2008 Oct 8. PMID: 81154560; PMCID: EFR7290501.  Pardasaney, MYRTIS LOIS Bonus, GEANNIE POUR., et al. (2012). Sensitivity to change and responsiveness of four balance measures for community-dwelling older adults. Physical therapy 92(3): 388-397.   GAIT: Distance walked: 461ft Assistive device utilized: None Level of assistance: SBA Comments: easily fatigued, on and off min guard on corners for balance but completed most of distance with SBA                                                                                                                                 TREATMENT DATE:   02/20/24 Bike L 2 In // bars stepping over 1/2 rolls with occasional use of UE needed for LOB In // bars side stepping over foam rolls    '                       '                   In // bars vector tap-various obj                                                         In // bars foam beam tandem fwd and back  with UE needed  In // bars foam beam side stepping In // bars foam bean func reaching 15# HS curls 2x10 5# knee extension 2x8 Resisted gait 4 way 4 x each CGA-min A- laterally very challenging HHA red tband SL hip flex,ext and abd 10 x each   02/13/24 Nustep level 4 x 6 minutes 2.5# marches 2.5# hip abduction 2.5# hip extension On airex reaching Side step on and off aires On airex ball toss 15# HS curls 2x10 5# leg extension 2x6 Feet on ball K2C, rotation, small bridge, isometric abs Passive stretch HS and piriformis  02/11/24  HEP, POC, review of past HEP, education as below   Nustep  L5x8 minutes all four extremities seat 8   PATIENT EDUCATION:  Education details: exam findings, POC, HEP, lots of encouragement to get more active and goal of PT being to transition to long term independent gym program   Person educated: Patient Education method: Explanation, Demonstration, and Handouts Education comprehension: verbalized understanding, returned demonstration, and needs further education  HOME EXERCISE PROGRAM: 2nd visit   ASSESSMENT:  CLINICAL IMPRESSION:  pt arrives without c/o pain. States she is getting over very bad COPD flare up. She was notably winded. Pt states balance is her main issue but some weakness too. Focus most of session on balance , she does try to right but with multi LOB and UE needed to catch self. CGA needed with cuing. Pt states scheduling for RT TK rev- about 3 month out.   Patient is a 83 y.o. F who was seen today for physical therapy evaluation and treatment for  Diagnosis  R26.81 (ICD-10-CM) - Unsteadiness on feet  . She is very well known to this clinic, and was just discharged about a month ago. She has not been compliant with HEP. Will attempt to improve balance and function with ultimate goal of transition to independent exercise program.   OBJECTIVE IMPAIRMENTS: Abnormal gait, decreased activity tolerance, decreased balance, decreased cognition, decreased knowledge of use of DME, decreased mobility, difficulty walking, decreased strength, decreased safety awareness, and pain.   ACTIVITY LIMITATIONS: carrying, lifting, standing, stairs, transfers, and locomotion level  PARTICIPATION LIMITATIONS: driving, shopping, community activity, and yard work  PERSONAL FACTORS: Age, Behavior pattern, Education, Fitness, Past/current experiences, Social background, and Time since onset of injury/illness/exacerbation are also affecting patient's functional outcome.   REHAB POTENTIAL: Fair return to PT after being DCed about a month ago, poor compliance with HEP and sedentary lifestyle   CLINICAL DECISION MAKING: Stable/uncomplicated  EVALUATION COMPLEXITY: Low   GOALS: Goals reviewed with patient? No  SHORT TERM GOALS: Target date: 03/03/2024   Will be  compliant with appropriate progressive HEP  Baseline: Goal status: INITIAL  2.  Will name 3 ways to reduce fall risk at home and in the community  Baseline:  Goal status: 02/20/24 MET  3.  Will complete 5xSTS in 15 seconds or less  Baseline:  Goal status: 02/20/24 MET    LONG TERM GOALS: Target date: 03/24/2024    MMT to improve by one grade in all weak groups  Baseline:  Goal status: INITIAL  2.  Will score at least 18 on DGI to show reduced fall risk  Baseline:  Goal status: INITIAL  3.  Will ambulate at least 662ft in with minimal fatigue to show improved community access and activity tolerance  Baseline:  Goal status: INITIAL  4.  Pain to generally be no more than 5/10 at worst  Baseline:  Goal status: INITIAL  5.  PSFS to improve by 2 points  Baseline:  Goal status: INITIAL  6.  Will be compliant with appropriate advanced HEP vs group gym programming  Baseline:  Goal status: INITIAL   PLAN:  PT FREQUENCY: 2x/week  PT DURATION: 6 weeks  PLANNED INTERVENTIONS: 97750- Physical Performance Testing, 97110-Therapeutic exercises, 97530- Therapeutic activity, [id number]- Neuromuscular re-education, 97535- Self Care, [id number]- Manual therapy, and [id number]- Gait training  PLAN FOR NEXT SESSION: functional activity tolerance, strength, balance, encourage gym classes/group classes for activity on her own. Will need to review the HEP and see what works for her, may have to streamline it   Jon Or PTA 02/20/24 11:43 AM

## 2024-02-26 ENCOUNTER — Encounter: Payer: Self-pay | Admitting: Physical Therapy

## 2024-02-26 ENCOUNTER — Ambulatory Visit: Admitting: Physical Therapy

## 2024-02-26 DIAGNOSIS — G8929 Other chronic pain: Secondary | ICD-10-CM

## 2024-02-26 DIAGNOSIS — M5442 Lumbago with sciatica, left side: Secondary | ICD-10-CM | POA: Diagnosis not present

## 2024-02-26 DIAGNOSIS — R262 Difficulty in walking, not elsewhere classified: Secondary | ICD-10-CM | POA: Diagnosis not present

## 2024-02-26 DIAGNOSIS — R2681 Unsteadiness on feet: Secondary | ICD-10-CM

## 2024-02-26 DIAGNOSIS — M6281 Muscle weakness (generalized): Secondary | ICD-10-CM | POA: Diagnosis not present

## 2024-02-26 DIAGNOSIS — M5441 Lumbago with sciatica, right side: Secondary | ICD-10-CM | POA: Diagnosis not present

## 2024-02-26 NOTE — Therapy (Signed)
 OUTPATIENT PHYSICAL THERAPY LOWER EXTREMITY TREATMENT   Patient Name: Joy [name] MRN: [id number] DOB:04/21/1941, 83 y.o., female Today's Date: 02/26/2024  END OF SESSION:  PT End of Session - 02/26/24 1345     Visit Number 4    Date for PT Re-Evaluation 03/24/24    PT Start Time 1346    PT Stop Time 1430    PT Time Calculation (min) 44 min    Activity Tolerance Patient tolerated treatment well    Behavior During Therapy [facility] for tasks assessed/performed          Past Medical History:  Diagnosis Date   Ankylosing spondylitis (HCC)    Anxiety    Arthritis    RHEUMATOID   Back pain    Bronchitis    Constipation    COPD (chronic obstructive pulmonary disease) (HCC)    CXR 01/11/18 showed mild COPD and chronic bronchitis   CTS (carpal tunnel syndrome)    Dry eye    Dry mouth    Dysrhythmia    irregularity unknown at this time - being evaluated by cardiology   Essential hypertension 11/24/2015   GERD (gastroesophageal reflux disease)    HLA B27 (HLA B27 positive)    Hypercholesteremia    Hyperlipidemia 11/24/2015   Hypertension    IBS (irritable bowel syndrome)    Joint pain    Neuropathy    OAB (overactive bladder)    Obesity    Osteoarthritis    Osteoporosis    Pre-diabetes    Rheumatoid arthritis (HCC) 11/24/2015   Sciatica    Seasonal allergies    Spinal stenosis    Past Surgical History:  Procedure Laterality Date   ABDOMINAL HYSTERECTOMY  1995   BACK SURGERY     BREAST SURGERY     REDUCTION   CATARACT EXTRACTION W/PHACO  08/01/2012   Procedure: CATARACT EXTRACTION PHACO AND INTRAOCULAR LENS PLACEMENT (IOC);  Surgeon: Gaither Quan, MD;  Location: [facility];  Service: Ophthalmology;  Laterality: Right;   EYE SURGERY Bilateral    cataract   HERNIA REPAIR     RIGHT ING.   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   Medication monitoring encounter 09/15/2021   Pain of left hip joint 08/10/2020   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   Prediabetes 02/01/2020   Body mass index (BMI) 36.0-36.9, adult 02/01/2020   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   Inflammation of sacroiliac joint (HCC) 10/06/2018   Chronic bronchitis (HCC) 09/25/2018   Mucopurulent chronic bronchitis (HCC) 08/21/2018   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   Spondylosis 06/13/2018   Status post lumbar spinal fusion 06/13/2018   Status post lumbar spine surgery for decompression of spinal cord 06/13/2018   History of total knee replacement, 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   History of total knee arthroplasty, right 09/20/2017   OA (osteoarthritis) of knee 12/26/2015   Essential hypertension  11/24/2015   Hyperlipidemia 11/24/2015   Rheumatoid arthritis (HCC) 11/24/2015   Spinal stenosis of lumbar region 08/24/2014   Lumbar spondylosis 07/21/2014    PCP: Katina Pfeiffer PA-C  REFERRING PROVIDER: Katina Pfeiffer, PA-C  REFERRING DIAG:  Diagnosis  R26.81 (ICD-10-CM) - Unsteadiness on feet    THERAPY DIAG:  Chronic bilateral low back pain with bilateral sciatica  Difficulty in walking, not elsewhere classified  Unsteadiness on feet  Muscle weakness (generalized)  Rationale for Evaluation and Treatment: Rehabilitation  ONSET DATE: chronic   SUBJECTIVE:   SUBJECTIVE STATEMENT: Pretty good, feel feel like  Im walking on two wet sponges neuropathy    I was just here and DCed in May, now back with a new referral. My balance is really bad and I have sciatica in my left leg. Need a TKR on the other side. Had two falls in the past month or so. Missed a step one time, other time I was inside the house and tripped in my house shoes. Its basically the same thing I came here with as last time, I didn't keep up my HEP from last round of PT.   PERTINENT HISTORY: See above  PAIN:  Are you having pain? Zero and a half, I'm good   PRECAUTIONS: None  RED FLAGS: None   WEIGHT BEARING RESTRICTIONS: No  FALLS:  Has patient fallen in last 6 months? Yes. 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    PATIENT SURVEYS:   THE PATIENT SPECIFIC FUNCTIONAL SCALE  Place score of 0-10 (0 = unable to perform activity and 10 = able to perform activity at the same level as before injury or problem)  Activity Date: Eval 02/11/24    Stairs  4    2. Yard work  2    3. Balance  3    4.      Total Score 3      Total Score = Sum of activity scores/number of activities  Minimally Detectable Change: 3 points (for single activity); 2 points (for average score)  [facility] (nd). The Patient Specific Functional Scale . Retrieved from [email or web address]   COGNITION: Overall cognitive status: mild impairments in cognition and STM noted, endorses forgetting familiar routes when driving             LOWER EXTREMITY MMT:  MMT Right eval Left eval  Hip flexion 3+ 4  Hip extension    Hip abduction 4 4  Hip adduction    Hip internal rotation    Hip external rotation    Knee flexion 4+ 4+  Knee extension 4+ 4+  Ankle dorsiflexion    Ankle plantarflexion    Ankle inversion    Ankle eversion     (Blank rows = not tested)    FUNCTIONAL TESTS:  5 times sit to stand: 23 seconds no UEs  3 minute walk test: 471ft no device  Dynamic Gait Index: Dynamic Gait Index  Mark the lowest level that applies.   Date Performed 02/11/24  Gait level surface (2) Mild Impairment: Walks 20', uses  AD, slower speed, mild gait deviations  2. Change in gait speed (2) Mild Impairment: Is able to change speed but demonstrates mild gait deviations, or not gait deviations but unable to achieve a significant change in velocity, or uses an assistive device  3. Gait with horizontal head turns (2) Mild Impairment: Performs head turns smoothly with slight change in gait velocity, i.e., minor disruption to smooth gait path or uses walking aid  4. Gait with vertical head turns (2) Mild Impairment: Performs head turns smoothly with slight change in gait velocity, i.e., minor disruption to smooth gait path or uses walking aid  5. Gait and pivot turn (1) Moderate Impairment: Turns slowly, requires verbal cueing, requires several small steps to catch balance following turn and stop  6. Step over obstacle (2) Mild Impairment: Is able to step over box, but must slow down and adjust steps to clear box safely  7. Step around obstacle (2) Mild Impairment: Is able to step around both cones, but must slow down and adjust steps to clear cones  8. Steps (0) Severe Impairment: Cannot do safely  Total score 13    Score Interpretation: Score of <19 indicates high risk of falls.  Minimally Clinically Important Difference (MCID):  =DGI scores of<21/24 = 1.80 points DGI scores of >21/24 = 0.60  points   [place] T, Inbar-Borovsky N, Brozgol M, Giladi N, [place] JM. The Dynamic Gait Index in healthy older adults: the role of stair climbing, fear of falling and gender. Gait Posture. 2009 Feb;29(2):237-41. doi: 10.1016/j.gaitpost.2008.08.013. Epub 2008 Oct 8. PMID: 81154560; PMCID: EFR7290501.  Pardasaney, MYRTIS LOIS Bonus, GEANNIE POUR., et al. (2012). Sensitivity to change and responsiveness of four balance measures for community-dwelling older adults. Physical therapy 92(3): 388-397.   GAIT: Distance walked: 427ft Assistive device utilized: None Level of assistance: SBA Comments: easily fatigued, on and off min guard on corners for balance but completed most of distance with SBA                                                                                                                                 TREATMENT DATE:  02/26/24 NuStep L 5 x 7 min Sit to stand holding 3lb dumbbell 2x10 Standing march and abduction at counter 2x10 6in step ups x10 RLE weakness UE assist at times HS curls 20lb 2x10 Leg Ext 5lb 2x10 Side steps on and off airex    02/20/24 Bike L 2 In // bars stepping over 1/2 rolls with occasional use of UE needed for LOB In // bars side stepping over foam rolls    '                       '                   In // bars vector tap-various obj  In // bars foam beam tandem fwd and back with UE needed  In // bars foam beam side stepping In // bars foam bean func reaching 15# HS curls 2x10 5# knee extension 2x8 Resisted gait 4 way 4 x each CGA-min A- laterally very challenging HHA red tband SL hip flex,ext and abd 10 x each   02/13/24 Nustep level 4 x 6 minutes 2.5# marches 2.5# hip abduction 2.5# hip extension On airex reaching Side step on and off aires On airex ball toss 15# HS curls 2x10 5# leg extension 2x6 Feet on ball K2C, rotation, small bridge, isometric abs Passive stretch HS and  piriformis  02/11/24  HEP, POC, review of past HEP, education as below   Nustep  L5x8 minutes all four extremities seat 8   PATIENT EDUCATION:  Education details: exam findings, POC, HEP, lots of encouragement to get more active and goal of PT being to transition to long term independent gym program  Person educated: Patient Education method: Explanation, Demonstration, and Handouts Education comprehension: verbalized understanding, returned demonstration, and needs further education  HOME EXERCISE PROGRAM: Access Code: [email or web address] URL: [email or web address] Date: 02/26/2024 Prepared by: Tanda Sorrow  Exercises - Sit to Stand  - 1 x daily - 7 x weekly - 3 sets - 10 reps - Standing March with Counter Support  - 1 x daily - 7 x weekly - 3 sets - 10 reps - Standing Hip Abduction with Unilateral Counter Support  - 1 x daily - 7 x weekly - 3 sets - 10 reps  ASSESSMENT:  CLINICAL IMPRESSION:  pt arrives without c/o pain. States that they have stopped her gabapentin  now her feet feels like two wet sponges. Establish HEP and reviewed all interventions. Functional weakness noted with step ups requiring UE assist times. CGA needed with cuing. Pt states scheduling for RT TK rev- about 3 month out.   Patient is a 83 y.o. F who was seen today for physical therapy evaluation and treatment for  Diagnosis  R26.81 (ICD-10-CM) - Unsteadiness on feet  . She is very well known to this clinic, and was just discharged about a month ago. She has not been compliant with HEP. Will attempt to improve balance and function with ultimate goal of transition to independent exercise program.   OBJECTIVE IMPAIRMENTS: Abnormal gait, decreased activity tolerance, decreased balance, decreased cognition, decreased knowledge of use of DME, decreased mobility, difficulty walking, decreased strength, decreased safety awareness, and pain.   ACTIVITY LIMITATIONS: carrying, lifting, standing, stairs,  transfers, and locomotion level  PARTICIPATION LIMITATIONS: driving, shopping, community activity, and yard work  PERSONAL FACTORS: Age, Behavior pattern, Education, Fitness, Past/current experiences, Social background, and Time since onset of injury/illness/exacerbation are also affecting patient's functional outcome.   REHAB POTENTIAL: Fair return to PT after being DCed about a month ago, poor compliance with HEP and sedentary lifestyle   CLINICAL DECISION MAKING: Stable/uncomplicated  EVALUATION COMPLEXITY: Low   GOALS: Goals reviewed with patient? No  SHORT TERM GOALS: Target date: 03/03/2024   Will be compliant with appropriate progressive HEP  Baseline: Goal status: INITIAL  2.  Will name 3 ways to reduce fall risk at home and in the community  Baseline:  Goal status: 02/20/24 MET  3.  Will complete 5xSTS in 15 seconds or less  Baseline:  Goal status: 02/20/24 MET    LONG TERM GOALS: Target date: 03/24/2024    MMT to improve by one grade in all weak groups  Baseline:  Goal status: INITIAL  2.  Will score at least 18 on DGI to show reduced fall risk  Baseline:  Goal status: INITIAL  3.  Will ambulate at least 650ft in with minimal fatigue to show improved community access and activity tolerance  Baseline:  Goal status: INITIAL  4.  Pain to generally be no more than 5/10 at worst  Baseline:  Goal status: Progressing 02/26/24  5.  PSFS to improve by 2 points  Baseline:  Goal status: INITIAL  6.  Will be compliant with appropriate advanced HEP vs group gym programming  Baseline:  Goal status: INITIAL   PLAN:  PT FREQUENCY: 2x/week  PT DURATION: 6 weeks  PLANNED INTERVENTIONS: 97750- Physical Performance Testing, 97110-Therapeutic exercises, 97530- Therapeutic activity, [id number]- Neuromuscular re-education, 97535- Self Care, [id number]- Manual therapy, and [id number]- Gait training  PLAN FOR NEXT SESSION: functional activity tolerance, strength, balance, encourage  gym classes/group classes for activity on her own.     Tanda Sorrow, PTA 02/26/24 1:46 PM

## 2024-02-28 ENCOUNTER — Ambulatory Visit: Admitting: Physical Therapy

## 2024-02-28 ENCOUNTER — Encounter: Payer: Self-pay | Admitting: Physical Therapy

## 2024-02-28 DIAGNOSIS — M6281 Muscle weakness (generalized): Secondary | ICD-10-CM

## 2024-02-28 DIAGNOSIS — G8929 Other chronic pain: Secondary | ICD-10-CM

## 2024-02-28 DIAGNOSIS — M5442 Lumbago with sciatica, left side: Secondary | ICD-10-CM | POA: Diagnosis not present

## 2024-02-28 DIAGNOSIS — M5441 Lumbago with sciatica, right side: Secondary | ICD-10-CM | POA: Diagnosis not present

## 2024-02-28 DIAGNOSIS — R2681 Unsteadiness on feet: Secondary | ICD-10-CM | POA: Diagnosis not present

## 2024-02-28 DIAGNOSIS — R262 Difficulty in walking, not elsewhere classified: Secondary | ICD-10-CM

## 2024-02-28 NOTE — Therapy (Signed)
 OUTPATIENT PHYSICAL THERAPY LOWER EXTREMITY TREATMENT   Patient Name: Joy [name] MRN: [id number] DOB:11-Feb-1941, 83 y.o., female Today's Date: 02/28/2024  END OF SESSION:  PT End of Session - 02/28/24 1011     Visit Number 5    Date for PT Re-Evaluation 03/24/24    PT Start Time 1015    PT Stop Time 1100    PT Time Calculation (min) 45 min    Activity Tolerance Patient tolerated treatment well    Behavior During Therapy [facility] for tasks assessed/performed          Past Medical History:  Diagnosis Date   Ankylosing spondylitis (HCC)    Anxiety    Arthritis    RHEUMATOID   Back pain    Bronchitis    Constipation    COPD (chronic obstructive pulmonary disease) (HCC)    CXR 01/11/18 showed mild COPD and chronic bronchitis   CTS (carpal tunnel syndrome)    Dry eye    Dry mouth    Dysrhythmia    irregularity unknown at this time - being evaluated by cardiology   Essential hypertension 11/24/2015   GERD (gastroesophageal reflux disease)    HLA B27 (HLA B27 positive)    Hypercholesteremia    Hyperlipidemia 11/24/2015   Hypertension    IBS (irritable bowel syndrome)    Joint pain    Neuropathy    OAB (overactive bladder)    Obesity    Osteoarthritis    Osteoporosis    Pre-diabetes    Rheumatoid arthritis (HCC) 11/24/2015   Sciatica    Seasonal allergies    Spinal stenosis    Past Surgical History:  Procedure Laterality Date   ABDOMINAL HYSTERECTOMY  1995   BACK SURGERY     BREAST SURGERY     REDUCTION   CATARACT EXTRACTION W/PHACO  08/01/2012   Procedure: CATARACT EXTRACTION PHACO AND INTRAOCULAR LENS PLACEMENT (IOC);  Surgeon: Gaither Quan, MD;  Location: [facility] OR;  Service: Ophthalmology;  Laterality: Right;   EYE SURGERY Bilateral    cataract   HERNIA REPAIR     RIGHT ING.   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   Medication monitoring encounter 09/15/2021   Pain of left hip joint 08/10/2020   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   Prediabetes 02/01/2020   Body mass index (BMI) 36.0-36.9, adult 02/01/2020   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   Inflammation of sacroiliac joint (HCC) 10/06/2018   Chronic bronchitis (HCC) 09/25/2018   Mucopurulent chronic bronchitis (HCC) 08/21/2018   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   Spondylosis 06/13/2018   Status post lumbar spinal fusion 06/13/2018   Status post lumbar spine surgery for decompression of spinal cord 06/13/2018   History of total knee replacement, 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   History of total knee arthroplasty, right 09/20/2017   OA (osteoarthritis) of knee 12/26/2015   Essential hypertension  11/24/2015   Hyperlipidemia 11/24/2015   Rheumatoid arthritis (HCC) 11/24/2015   Spinal stenosis of lumbar region 08/24/2014   Lumbar spondylosis 07/21/2014    PCP: Katina Pfeiffer PA-C  REFERRING PROVIDER: Katina Pfeiffer, PA-C  REFERRING DIAG:  Diagnosis  R26.81 (ICD-10-CM) - Unsteadiness on feet    THERAPY DIAG:  Chronic bilateral low back pain with bilateral sciatica  Difficulty in walking, not elsewhere classified  Unsteadiness on feet  Muscle weakness (generalized)  Rationale for Evaluation and Treatment: Rehabilitation  ONSET DATE: chronic   SUBJECTIVE:   SUBJECTIVE STATEMENT: Doing well, back does not  hurt   I was just here and DCed in May, now back with a new referral. My balance is really bad and I have sciatica in my left leg. Need a TKR on the other side. Had two falls in the past month or so. Missed a step one time, other time I was inside the house and tripped in my house shoes. Its basically the same thing I came here with as last time, I didn't keep up my HEP from last round of PT.   PERTINENT HISTORY: See above  PAIN:  Are you having pain? 2/10 knees  PRECAUTIONS: None  RED FLAGS: None   WEIGHT BEARING RESTRICTIONS: No  FALLS:  Has patient fallen in last 6 months? Yes. 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    PATIENT SURVEYS:   THE PATIENT SPECIFIC FUNCTIONAL SCALE  Place score of 0-10 (0 = unable to perform activity and 10 = able to perform activity at the same level as before injury or problem)  Activity Date: Eval 02/11/24    Stairs  4    2. Yard work  2    3. Balance  3    4.      Total Score 3      Total Score = Sum of activity scores/number of activities  Minimally Detectable Change: 3 points (for single activity); 2 points (for average score)  [facility] (nd). The Patient Specific Functional Scale . Retrieved from [email or web address]   COGNITION: Overall cognitive status: mild impairments in cognition and STM noted, endorses forgetting familiar routes when driving            LOWER EXTREMITY MMT:  MMT Right eval Left eval  Hip  flexion 3+ 4  Hip extension    Hip abduction 4 4  Hip adduction    Hip internal rotation    Hip external rotation    Knee flexion 4+ 4+  Knee extension 4+ 4+  Ankle dorsiflexion    Ankle plantarflexion    Ankle inversion    Ankle eversion     (Blank rows = not tested)    FUNCTIONAL TESTS:  5 times sit to stand: 23 seconds no UEs  3 minute walk test: 457ft no device  Dynamic Gait Index: Dynamic Gait Index  Mark the lowest level that applies.   Date Performed 02/11/24  Gait level surface (2) Mild Impairment: Walks 20', uses AD, slower speed, mild gait deviations  2. Change in gait speed (  2) Mild Impairment: Is able to change speed but demonstrates mild gait deviations, or not gait deviations but unable to achieve a significant change in velocity, or uses an assistive device  3. Gait with horizontal head turns (2) Mild Impairment: Performs head turns smoothly with slight change in gait velocity, i.e., minor disruption to smooth gait path or uses walking aid  4. Gait with vertical head turns (2) Mild Impairment: Performs head turns smoothly with slight change in gait velocity, i.e., minor disruption to smooth gait path or uses walking aid  5. Gait and pivot turn (1) Moderate Impairment: Turns slowly, requires verbal cueing, requires several small steps to catch balance following turn and stop  6. Step over obstacle (2) Mild Impairment: Is able to step over box, but must slow down and adjust steps to clear box safely  7. Step around obstacle (2) Mild Impairment: Is able to step around both cones, but must slow down and adjust steps to clear cones  8. Steps (0) Severe Impairment: Cannot do safely  Total score 13    Score Interpretation: Score of <19 indicates high risk of falls.  Minimally Clinically Important Difference (MCID):  =DGI scores of<21/24 = 1.80 points DGI scores of >21/24 = 0.60 points   [place] T, Inbar-Borovsky N, Brozgol M, Giladi N, [place] JM. The Dynamic Gait  Index in healthy older adults: the role of stair climbing, fear of falling and gender. Gait Posture. 2009 Feb;29(2):237-41. doi: 10.1016/j.gaitpost.2008.08.013. Epub 2008 Oct 8. PMID: 81154560; PMCID: EFR7290501.  Pardasaney, MYRTIS LOIS Bonus, GEANNIE POUR., et al. (2012). Sensitivity to change and responsiveness of four balance measures for community-dwelling older adults. Physical therapy 92(3): 388-397.   GAIT: Distance walked: 432ft Assistive device utilized: None Level of assistance: SBA Comments: easily fatigued, on and off min guard on corners for balance but completed most of distance with SBA                                                                                                                                 TREATMENT DATE:  02/28/24 NuStep L 5 x 7 min 20lb resisted gait 4 way x 4 each HS 20lb 2x12 Leg Ext 5lb 2x10 Sit to stand holding 3lb dumbbell 2x10 Bridges 2x10 Feet on ball K2C, rotation, small bridge, isometric abs Passive stretch HS and piriformis  02/26/24 NuStep L 5 x 7 min Sit to stand holding 3lb dumbbell 2x10 Standing march and abduction at counter 2x10 6in step ups x10 RLE weakness UE assist at times HS curls 20lb 2x10 Leg Ext 5lb 2x10 Side steps on and off airex    02/20/24 Bike L 2 In // bars stepping over 1/2 rolls with occasional use of UE needed for LOB In // bars side stepping over foam rolls    '                       '  In // bars vector tap-various obj                                                         In // bars foam beam tandem fwd and back with UE needed  In // bars foam beam side stepping In // bars foam bean func reaching 15# HS curls 2x10 5# knee extension 2x8 Resisted gait 4 way 4 x each CGA-min A- laterally very challenging HHA red tband SL hip flex,ext and abd 10 x each   02/13/24 Nustep level 4 x 6 minutes 2.5# marches 2.5# hip abduction 2.5# hip extension On airex reaching Side step on and off  aires On airex ball toss 15# HS curls 2x10 5# leg extension 2x6 Feet on ball K2C, rotation, small bridge, isometric abs Passive stretch HS and piriformis  02/11/24  HEP, POC, review of past HEP, education as below   Nustep  L5x8 minutes all four extremities seat 8   PATIENT EDUCATION:  Education details: exam findings, POC, HEP, lots of encouragement to get more active and goal of PT being to transition to long term independent gym program  Person educated: Patient Education method: Explanation, Demonstration, and Handouts Education comprehension: verbalized understanding, returned demonstration, and needs further education  HOME EXERCISE PROGRAM: Access Code: [email or web address] URL: [email or web address] Date: 02/26/2024 Prepared by: Tanda Sorrow  Exercises - Sit to Stand  - 1 x daily - 7 x weekly - 3 sets - 10 reps - Standing March with Counter Support  - 1 x daily - 7 x weekly - 3 sets - 10 reps - Standing Hip Abduction with Unilateral Counter Support  - 1 x daily - 7 x weekly - 3 sets - 10 reps  ASSESSMENT:  CLINICAL IMPRESSION:  pt arrives ding well, despite her feel still feeling like sponges. Instability today with resisted gait. Increase resistance tolerated with HS curls. No pain reported during session. Some RLE HS and piriformis tightness with passive stretching. Pt states scheduling for RT TK rev- about 3 month out.   Patient is a 83 y.o. F who was seen today for physical therapy evaluation and treatment for  Diagnosis  R26.81 (ICD-10-CM) - Unsteadiness on feet  . She is very well known to this clinic, and was just discharged about a month ago. She has not been compliant with HEP. Will attempt to improve balance and function with ultimate goal of transition to independent exercise program.   OBJECTIVE IMPAIRMENTS: Abnormal gait, decreased activity tolerance, decreased balance, decreased cognition, decreased knowledge of use of DME, decreased mobility,  difficulty walking, decreased strength, decreased safety awareness, and pain.   ACTIVITY LIMITATIONS: carrying, lifting, standing, stairs, transfers, and locomotion level  PARTICIPATION LIMITATIONS: driving, shopping, community activity, and yard work  PERSONAL FACTORS: Age, Behavior pattern, Education, Fitness, Past/current experiences, Social background, and Time since onset of injury/illness/exacerbation are also affecting patient's functional outcome.   REHAB POTENTIAL: Fair return to PT after being DCed about a month ago, poor compliance with HEP and sedentary lifestyle   CLINICAL DECISION MAKING: Stable/uncomplicated  EVALUATION COMPLEXITY: Low   GOALS: Goals reviewed with patient? No  SHORT TERM GOALS: Target date: 03/03/2024   Will be compliant with appropriate progressive HEP  Baseline: Goal status: INITIAL  2.  Will name 3 ways to reduce fall risk at home  and in the community  Baseline:  Goal status: 02/20/24 MET  3.  Will complete 5xSTS in 15 seconds or less  Baseline:  Goal status: 02/20/24 MET    LONG TERM GOALS: Target date: 03/24/2024    MMT to improve by one grade in all weak groups  Baseline:  Goal status: INITIAL  2.  Will score at least 18 on DGI to show reduced fall risk  Baseline:  Goal status: INITIAL  3.  Will ambulate at least 636ft in with minimal fatigue to show improved community access and activity tolerance  Baseline:  Goal status: INITIAL  4.  Pain to generally be no more than 5/10 at worst  Baseline:  Goal status: Progressing 02/26/24  5.  PSFS to improve by 2 points  Baseline:  Goal status: INITIAL  6.  Will be compliant with appropriate advanced HEP vs group gym programming  Baseline:  Goal status: INITIAL   PLAN:  PT FREQUENCY: 2x/week  PT DURATION: 6 weeks  PLANNED INTERVENTIONS: 97750- Physical Performance Testing, 97110-Therapeutic exercises, 97530- Therapeutic activity, [id number]- Neuromuscular re-education, 97535- Self  Care, [id number]- Manual therapy, and [id number]- Gait training  PLAN FOR NEXT SESSION: functional activity tolerance, strength, balance, encourage gym classes/group classes for activity on her own.     Tanda Sorrow, PTA 02/28/24 10:12 AM

## 2024-02-29 ENCOUNTER — Ambulatory Visit
Admission: RE | Admit: 2024-02-29 | Discharge: 2024-02-29 | Disposition: A | Discharge status: Home / Self Care | Source: Ambulatory Visit | Attending: Family Medicine | Admitting: Family Medicine

## 2024-02-29 DIAGNOSIS — R2681 Unsteadiness on feet: Secondary | ICD-10-CM

## 2024-02-29 DIAGNOSIS — R262 Difficulty in walking, not elsewhere classified: Secondary | ICD-10-CM | POA: Diagnosis not present

## 2024-02-29 MED ORDER — GADOPICLENOL 0.5 MMOL/ML IV SOLN: 6.0000 mL | Freq: Once | INTRAVENOUS | Status: DC | PRN

## 2024-02-29 MED ORDER — GADOPICLENOL 0.5 MMOL/ML IV SOLN
7.5000 mL | Freq: Once | INTRAVENOUS | Status: AC | PRN
  Administered 2024-02-29: 6 mL via INTRAVENOUS

## 2024-03-02 ENCOUNTER — Encounter: Payer: Self-pay | Admitting: Physical Therapy

## 2024-03-02 ENCOUNTER — Ambulatory Visit: Admitting: Physical Therapy

## 2024-03-02 DIAGNOSIS — M5441 Lumbago with sciatica, right side: Secondary | ICD-10-CM | POA: Diagnosis not present

## 2024-03-02 DIAGNOSIS — R2681 Unsteadiness on feet: Secondary | ICD-10-CM

## 2024-03-02 DIAGNOSIS — M5442 Lumbago with sciatica, left side: Secondary | ICD-10-CM | POA: Diagnosis not present

## 2024-03-02 DIAGNOSIS — R262 Difficulty in walking, not elsewhere classified: Secondary | ICD-10-CM

## 2024-03-02 DIAGNOSIS — M6281 Muscle weakness (generalized): Secondary | ICD-10-CM

## 2024-03-02 DIAGNOSIS — G8929 Other chronic pain: Secondary | ICD-10-CM | POA: Diagnosis not present

## 2024-03-02 NOTE — Therapy (Signed)
 OUTPATIENT PHYSICAL THERAPY LOWER EXTREMITY TREATMENT   Patient Name: Joy [name] MRN: [id number] DOB:[birth year], 83 y.o., female Today's Date: 03/02/2024  END OF SESSION:  PT End of Session - 03/02/24 1318     Visit Number 6    Date for PT Re-Evaluation 03/24/24    Authorization Type MCR and [employer]    Authorization Time Period 02/11/24 to 03/24/24    Progress Note Due on Visit 10    PT Start Time 1302    PT Stop Time 1341    PT Time Calculation (min) 39 min    Activity Tolerance Patient tolerated treatment well    Behavior During Therapy [facility] for tasks assessed/performed           Past Medical History:  Diagnosis Date   Ankylosing spondylitis (HCC)    Anxiety    Arthritis    RHEUMATOID   Back pain    Bronchitis    Constipation    COPD (chronic obstructive pulmonary disease) (HCC)    CXR 01/11/18 showed mild COPD and chronic bronchitis   CTS (carpal tunnel syndrome)    Dry eye    Dry mouth    Dysrhythmia    irregularity unknown at this time - being evaluated by cardiology   Essential hypertension 11/24/2015   GERD (gastroesophageal reflux disease)    HLA B27 (HLA B27 positive)    Hypercholesteremia    Hyperlipidemia 11/24/2015   Hypertension    IBS (irritable bowel syndrome)    Joint pain    Neuropathy    OAB (overactive bladder)    Obesity    Osteoarthritis    Osteoporosis    Pre-diabetes    Rheumatoid arthritis (HCC) 11/24/2015   Sciatica    Seasonal allergies    Spinal stenosis    Past Surgical History:  Procedure Laterality Date   ABDOMINAL HYSTERECTOMY  1995   BACK SURGERY     BREAST SURGERY     REDUCTION   CATARACT EXTRACTION W/PHACO  08/01/2012   Procedure: CATARACT EXTRACTION PHACO AND INTRAOCULAR LENS PLACEMENT (IOC);  Surgeon: Gaither Quan, MD;  Location: [facility];  Service: Ophthalmology;  Laterality: Right;   EYE SURGERY Bilateral    cataract   HERNIA REPAIR     RIGHT ING.   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   Medication monitoring encounter 09/15/2021   Pain of left hip joint 08/10/2020   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   Prediabetes 02/01/2020   Body mass index (BMI) 36.0-36.9, adult 02/01/2020   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   Inflammation of sacroiliac joint (HCC) 10/06/2018   Chronic bronchitis (HCC) 09/25/2018   Mucopurulent chronic bronchitis (HCC) 08/21/2018   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   Spondylosis 06/13/2018   Status post lumbar spinal fusion 06/13/2018   Status post lumbar spine surgery for decompression of spinal cord 06/13/2018  History of total knee replacement, 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   History of total knee arthroplasty, right 09/20/2017   OA (osteoarthritis) of knee 12/26/2015   Essential hypertension 11/24/2015   Hyperlipidemia 11/24/2015   Rheumatoid arthritis (HCC) 11/24/2015   Spinal stenosis of lumbar region 08/24/2014   Lumbar spondylosis 07/21/2014    PCP: Katina Pfeiffer PA-C  REFERRING PROVIDER: Katina Pfeiffer, PA-C  REFERRING DIAG:  Diagnosis  R26.81 (ICD-10-CM) - Unsteadiness on feet    THERAPY DIAG:  Chronic bilateral low back pain with bilateral sciatica  Difficulty in walking, not elsewhere classified  Unsteadiness on feet  Muscle weakness (generalized)  Rationale for  Evaluation and Treatment: Rehabilitation  ONSET DATE: chronic   SUBJECTIVE:   SUBJECTIVE STATEMENT:    Feeling better but there are still a lot of times where I don't feel much better at all. Felt good after PT last time, but Saturdays tend to do me in. Have gotten away from using my cane nearly as much. Feel like balance is getting better.   EVAL: I was just here and DCed in May, now back with a new referral. My balance is really bad and I have sciatica in my left leg. Need a TKR on the other side. Had two falls in the past month or so. Missed a step one time, other time I was inside the house and tripped in my house shoes. Its basically the same thing I came here with as last time, I didn't keep up my HEP from last round of PT.   PERTINENT HISTORY: See above  PAIN:  Are you having pain? 0/10 now   PRECAUTIONS: None  RED FLAGS: None   WEIGHT BEARING RESTRICTIONS: No  FALLS:  Has patient fallen in last 6 months? Yes. 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    PATIENT SURVEYS:   THE PATIENT SPECIFIC FUNCTIONAL SCALE  Place score of 0-10 (0 = unable to perform activity and 10 = able to perform activity at the same level as before injury or problem)  Activity Date: Eval 02/11/24 03/02/24   Stairs  4 8   2. Yard work  2 2   3. Balance  3 5   4.      Total Score 3 5     Total Score = Sum of activity scores/number of activities  Minimally Detectable Change: 3 points (for single activity); 2 points (for average score)  [employer] [employer]  [facility] (nd). The Patient Specific Functional Scale . Retrieved from [email or web address]   COGNITION: Overall cognitive status: mild impairments in cognition and STM noted, endorses forgetting familiar routes when driving            LOWER EXTREMITY MMT:  MMT Right eval Left eval  Hip flexion 3+ 4  Hip extension    Hip abduction 4 4  Hip adduction    Hip internal rotation    Hip external rotation    Knee flexion 4+ 4+  Knee extension 4+ 4+  Ankle dorsiflexion    Ankle plantarflexion    Ankle inversion    Ankle eversion     (  Blank rows = not tested)    FUNCTIONAL TESTS:  5 times sit to stand: 23 seconds no UEs  3 minute walk test: 455ft no device  Dynamic Gait Index: Dynamic Gait Index  Mark the lowest level that applies.   Date Performed 02/11/24  Gait level surface (2) Mild Impairment: Walks 20', uses AD, slower speed, mild gait deviations  2. Change in gait speed (2) Mild Impairment: Is able to change speed but demonstrates mild gait deviations, or not gait deviations but unable to achieve a significant change in velocity, or uses an assistive device  3. Gait with horizontal head turns (2) Mild Impairment: Performs head turns smoothly with slight change in gait velocity, i.e., minor disruption to smooth gait path or uses walking aid  4. Gait with vertical head turns (2) Mild Impairment: Performs head turns smoothly with slight change in gait velocity, i.e., minor disruption to smooth gait path or uses walking aid  5. Gait and pivot turn (1) Moderate Impairment: Turns slowly, requires verbal cueing, requires several small steps to catch balance following turn and stop  6. Step over obstacle (2) Mild Impairment: Is able to step over box, but must slow down and adjust steps to clear box safely  7. Step around obstacle (2) Mild Impairment: Is able to step around both cones, but must slow down and adjust steps to clear  cones  8. Steps (0) Severe Impairment: Cannot do safely  Total score 13    Score Interpretation: Score of <19 indicates high risk of falls.  Minimally Clinically Important Difference (MCID):  =DGI scores of<21/24 = 1.80 points DGI scores of >21/24 = 0.60 points   [place] T, Inbar-Borovsky N, Brozgol M, Giladi N, [place] JM. The Dynamic Gait Index in healthy older adults: the role of stair climbing, fear of falling and gender. Gait Posture. 2009 Feb;29(2):237-41. doi: 10.1016/j.gaitpost.2008.08.013. Epub 2008 Oct 8. PMID: 81154560; PMCID: EFR7290501.  Pardasaney, MYRTIS LOIS Bonus, GEANNIE POUR., et al. (2012). Sensitivity to change and responsiveness of four balance measures for community-dwelling older adults. Physical therapy 92(3): 388-397.   GAIT: Distance walked: 41ft Assistive device utilized: None Level of assistance: SBA Comments: easily fatigued, on and off min guard on corners for balance but completed most of distance with SBA                                                                                                                                 TREATMENT DATE:   03/02/24  PSFS update  Nustep L5x8 minutes all four extremities, seat 8   Tandem stance 3x30 seconds B blue foam pad Side steps on blue foam pad x4 laps occasional posterior LOB One foot on BOSU/other on floor 3x30 seconds B     For functional strength: - Forward step ups 6 inch x15 B - Lateral step ups 6 inch x15 B    02/28/24 NuStep L 5 x 7 min 20lb resisted gait 4  way x 4 each HS 20lb 2x12 Leg Ext 5lb 2x10 Sit to stand holding 3lb dumbbell 2x10 Bridges 2x10 Feet on ball K2C, rotation, small bridge, isometric abs Passive stretch HS and piriformis  02/26/24 NuStep L 5 x 7 min Sit to stand holding 3lb dumbbell 2x10 Standing march and abduction at counter 2x10 6in step ups x10 RLE weakness UE assist at times HS curls 20lb 2x10 Leg Ext 5lb 2x10 Side steps on and off airex    02/20/24 Bike  L 2 In // bars stepping over 1/2 rolls with occasional use of UE needed for LOB In // bars side stepping over foam rolls    '                       '                   In // bars vector tap-various obj                                                         In // bars foam beam tandem fwd and back with UE needed  In // bars foam beam side stepping In // bars foam bean func reaching 15# HS curls 2x10 5# knee extension 2x8 Resisted gait 4 way 4 x each CGA-min A- laterally very challenging HHA red tband SL hip flex,ext and abd 10 x each   02/13/24 Nustep level 4 x 6 minutes 2.5# marches 2.5# hip abduction 2.5# hip extension On airex reaching Side step on and off aires On airex ball toss 15# HS curls 2x10 5# leg extension 2x6 Feet on ball K2C, rotation, small bridge, isometric abs Passive stretch HS and piriformis  02/11/24  HEP, POC, review of past HEP, education as below   Nustep  L5x8 minutes all four extremities seat 8   PATIENT EDUCATION:  Education details: exam findings, POC, HEP, lots of encouragement to get more active and goal of PT being to transition to long term independent gym program  Person educated: Patient Education method: Explanation, Demonstration, and Handouts Education comprehension: verbalized understanding, returned demonstration, and needs further education  HOME EXERCISE PROGRAM: Access Code: [email or web address] URL: [email or web address] Date: 02/26/2024 Prepared by: Tanda Sorrow  Exercises - Sit to Stand  - 1 x daily - 7 x weekly - 3 sets - 10 reps - Standing March with Counter Support  - 1 x daily - 7 x weekly - 3 sets - 10 reps - Standing Hip Abduction with Unilateral Counter Support  - 1 x daily - 7 x weekly - 3 sets - 10 reps  ASSESSMENT:  CLINICAL IMPRESSION:    Arrives today feeling OK, has good and bad days- but bad days seem highly correlated to times when she is less active overall, which is logical. Looks to be  making good progress towards STGs  and shows some improvement on PSFS as well. Warmed up on Nustep and then otherwise continued focus on strength and balance this session with progressions as tolerated. Will continue to progress challenges.    Patient is a 83 y.o. F who was seen today for physical therapy evaluation and treatment for  Diagnosis  R26.81 (ICD-10-CM) - Unsteadiness on feet  . She is very well known to this clinic, and was  just discharged about a month ago. She has not been compliant with HEP. Will attempt to improve balance and function with ultimate goal of transition to independent exercise program.   OBJECTIVE IMPAIRMENTS: Abnormal gait, decreased activity tolerance, decreased balance, decreased cognition, decreased knowledge of use of DME, decreased mobility, difficulty walking, decreased strength, decreased safety awareness, and pain.   ACTIVITY LIMITATIONS: carrying, lifting, standing, stairs, transfers, and locomotion level  PARTICIPATION LIMITATIONS: driving, shopping, community activity, and yard work  PERSONAL FACTORS: Age, Behavior pattern, Education, Fitness, Past/current experiences, Social background, and Time since onset of injury/illness/exacerbation are also affecting patient's functional outcome.   REHAB POTENTIAL: Fair return to PT after being DCed about a month ago, poor compliance with HEP and sedentary lifestyle   CLINICAL DECISION MAKING: Stable/uncomplicated  EVALUATION COMPLEXITY: Low   GOALS: Goals reviewed with patient? No  SHORT TERM GOALS: Target date: 03/03/2024   Will be compliant with appropriate progressive HEP  Baseline: Goal status: INITIAL  2.  Will name 3 ways to reduce fall risk at home and in the community  Baseline:  Goal status: 02/20/24 MET  3.  Will complete 5xSTS in 15 seconds or less  Baseline:  Goal status: 02/20/24 MET    LONG TERM GOALS: Target date: 03/24/2024    MMT to improve by one grade in all weak groups   Baseline:  Goal status: INITIAL  2.  Will score at least 18 on DGI to show reduced fall risk  Baseline:  Goal status: INITIAL  3.  Will ambulate at least 666ft in with minimal fatigue to show improved community access and activity tolerance  Baseline:  Goal status: INITIAL  4.  Pain to generally be no more than 5/10 at worst  Baseline:  Goal status: Progressing 02/26/24  5.  PSFS to improve by 2 points  Baseline:  Goal status: MET 03/02/24  6.  Will be compliant with appropriate advanced HEP vs group gym programming  Baseline:  Goal status: INITIAL   PLAN:  PT FREQUENCY: 2x/week  PT DURATION: 6 weeks  PLANNED INTERVENTIONS: 97750- Physical Performance Testing, 97110-Therapeutic exercises, 97530- Therapeutic activity, 97112- Neuromuscular re-education, 97535- Self Care, [id number]- Manual therapy, and [id number]- Gait training  PLAN FOR NEXT SESSION: functional activity tolerance, strength, balance, encourage gym classes/group classes for activity on her own/work towards transition to [facility], PT, DPT 03/02/24 1:44 PM

## 2024-03-03 DIAGNOSIS — M0589 Other rheumatoid arthritis with rheumatoid factor of multiple sites: Secondary | ICD-10-CM | POA: Diagnosis not present

## 2024-03-05 ENCOUNTER — Encounter: Payer: Self-pay | Admitting: Physical Therapy

## 2024-03-05 ENCOUNTER — Ambulatory Visit: Admitting: Physical Therapy

## 2024-03-05 DIAGNOSIS — R2681 Unsteadiness on feet: Secondary | ICD-10-CM | POA: Diagnosis not present

## 2024-03-05 DIAGNOSIS — M5441 Lumbago with sciatica, right side: Secondary | ICD-10-CM | POA: Diagnosis not present

## 2024-03-05 DIAGNOSIS — M5442 Lumbago with sciatica, left side: Secondary | ICD-10-CM | POA: Diagnosis not present

## 2024-03-05 DIAGNOSIS — M6281 Muscle weakness (generalized): Secondary | ICD-10-CM

## 2024-03-05 DIAGNOSIS — R262 Difficulty in walking, not elsewhere classified: Secondary | ICD-10-CM

## 2024-03-05 DIAGNOSIS — G8929 Other chronic pain: Secondary | ICD-10-CM

## 2024-03-05 NOTE — Therapy (Signed)
 OUTPATIENT PHYSICAL THERAPY LOWER EXTREMITY TREATMENT   Patient Name: Joy [name] MRN: [id number] DOB:05-Mar-1941, 83 y.o., female Today's Date: 03/05/2024  END OF SESSION:  PT End of Session - 03/05/24 1309     Visit Number 7    Date for PT Re-Evaluation 03/24/24    Authorization Type MCR and [employer]    Authorization Time Period 02/11/24 to 03/24/24    Progress Note Due on Visit 10    PT Start Time 1302    PT Stop Time 1342    PT Time Calculation (min) 40 min    Activity Tolerance Patient tolerated treatment well    Behavior During Therapy [facility] for tasks assessed/performed            Past Medical History:  Diagnosis Date   Ankylosing spondylitis (HCC)    Anxiety    Arthritis    RHEUMATOID   Back pain    Bronchitis    Constipation    COPD (chronic obstructive pulmonary disease) (HCC)    CXR 01/11/18 showed mild COPD and chronic bronchitis   CTS (carpal tunnel syndrome)    Dry eye    Dry mouth    Dysrhythmia    irregularity unknown at this time - being evaluated by cardiology   Essential hypertension 11/24/2015   GERD (gastroesophageal reflux disease)    HLA B27 (HLA B27 positive)    Hypercholesteremia    Hyperlipidemia 11/24/2015   Hypertension    IBS (irritable bowel syndrome)    Joint pain    Neuropathy    OAB (overactive bladder)    Obesity    Osteoarthritis    Osteoporosis    Pre-diabetes    Rheumatoid arthritis (HCC) 11/24/2015   Sciatica    Seasonal allergies    Spinal stenosis    Past Surgical History:  Procedure Laterality Date   ABDOMINAL HYSTERECTOMY  1995   BACK SURGERY     BREAST SURGERY     REDUCTION   CATARACT EXTRACTION W/PHACO  08/01/2012   Procedure: CATARACT EXTRACTION PHACO AND INTRAOCULAR LENS PLACEMENT (IOC);  Surgeon: Gaither Quan, MD;  Location: [facility] OR;  Service: Ophthalmology;  Laterality: Right;   EYE SURGERY Bilateral    cataract   HERNIA REPAIR     RIGHT ING.   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   Medication monitoring encounter 09/15/2021   Pain of left hip joint 08/10/2020   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   Prediabetes 02/01/2020   Body mass index (BMI) 36.0-36.9, adult 02/01/2020   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   Inflammation of sacroiliac joint (HCC) 10/06/2018   Chronic bronchitis (HCC) 09/25/2018   Mucopurulent chronic bronchitis (HCC) 08/21/2018   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   Spondylosis 06/13/2018   Status post lumbar spinal fusion 06/13/2018   Status post lumbar spine surgery for decompression of spinal cord 06/13/2018  History of total knee replacement, 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   History of total knee arthroplasty, right 09/20/2017   OA (osteoarthritis) of knee 12/26/2015   Essential hypertension 11/24/2015   Hyperlipidemia 11/24/2015   Rheumatoid arthritis (HCC) 11/24/2015   Spinal stenosis of lumbar region 08/24/2014   Lumbar spondylosis 07/21/2014    PCP: Katina Pfeiffer PA-C  REFERRING PROVIDER: Katina Pfeiffer, PA-C  REFERRING DIAG:  Diagnosis  R26.81 (ICD-10-CM) - Unsteadiness on feet    THERAPY DIAG:  Chronic bilateral low back pain with bilateral sciatica  Difficulty in walking, not elsewhere classified  Unsteadiness on feet  Muscle weakness (generalized)  Rationale for  Evaluation and Treatment: Rehabilitation  ONSET DATE: chronic   SUBJECTIVE:   SUBJECTIVE STATEMENT:    Didn't sleep much last night, not sure why. Energy level is a little lower today.   EVAL: I was just here and DCed in May, now back with a new referral. My balance is really bad and I have sciatica in my left leg. Need a TKR on the other side. Had two falls in the past month or so. Missed a step one time, other time I was inside the house and tripped in my house shoes. Its basically the same thing I came here with as last time, I didn't keep up my HEP from last round of PT.   PERTINENT HISTORY: See above  PAIN:  Are you having pain? 0/10 now, no pain just weak    PRECAUTIONS: None  RED FLAGS: None   WEIGHT BEARING RESTRICTIONS: No  FALLS:  Has patient fallen in last 6 months? Yes. 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    PATIENT SURVEYS:   THE PATIENT SPECIFIC FUNCTIONAL SCALE  Place score of 0-10 (0 = unable to perform activity and 10 = able to perform activity at the same level as before injury or problem)  Activity Date: Eval 02/11/24 03/02/24   Stairs  4 8   2. Yard work  2 2   3. Balance  3 5   4.      Total Score 3 5     Total Score = Sum of activity scores/number of activities  Minimally Detectable Change: 3 points (for single activity); 2 points (for average score)  [facility] (nd). The Patient Specific Functional Scale . Retrieved from  [email or web address]   COGNITION: Overall cognitive status: mild impairments in cognition and STM noted, endorses forgetting familiar routes when driving            LOWER EXTREMITY MMT:  MMT Right eval Left eval  Hip flexion 3+ 4  Hip extension    Hip abduction 4 4  Hip adduction    Hip internal rotation    Hip external rotation    Knee flexion 4+ 4+  Knee extension 4+ 4+  Ankle dorsiflexion    Ankle plantarflexion    Ankle inversion    Ankle eversion     (Blank rows = not tested)    FUNCTIONAL TESTS:  5 times sit to stand: 23 seconds no UEs  3 minute  walk test: 4104ft no device  Dynamic Gait Index: Dynamic Gait Index  Mark the lowest level that applies.   Date Performed 02/11/24  Gait level surface (2) Mild Impairment: Walks 20', uses AD, slower speed, mild gait deviations  2. Change in gait speed (2) Mild Impairment: Is able to change speed but demonstrates mild gait deviations, or not gait deviations but unable to achieve a significant change in velocity, or uses an assistive device  3. Gait with horizontal head turns (2) Mild Impairment: Performs head turns smoothly with slight change in gait velocity, i.e., minor disruption to smooth gait path or uses walking aid  4. Gait with vertical head turns (2) Mild Impairment: Performs head turns smoothly with slight change in gait velocity, i.e., minor disruption to smooth gait path or uses walking aid  5. Gait and pivot turn (1) Moderate Impairment: Turns slowly, requires verbal cueing, requires several small steps to catch balance following turn and stop  6. Step over obstacle (2) Mild Impairment: Is able to step over box, but must slow down and adjust steps to clear box safely  7. Step around obstacle (2) Mild Impairment: Is able to step around both cones, but must slow down and adjust steps to clear cones  8. Steps (0) Severe Impairment: Cannot do safely  Total score 13     Score Interpretation: Score of <19 indicates high risk of falls.  Minimally Clinically Important Difference (MCID):  =DGI scores of<21/24 = 1.80 points DGI scores of >21/24 = 0.60 points   Trego T, Inbar-Borovsky N, Brozgol M, Giladi N, [place] JM. The Dynamic Gait Index in healthy older adults: the role of stair climbing, fear of falling and gender. Gait Posture. 2009 Feb;29(2):237-41. doi: 10.1016/j.gaitpost.2008.08.013. Epub 2008 Oct 8. PMID: 81154560; PMCID: EFR7290501.  Pardasaney, MYRTIS LOIS Bonus, GEANNIE POUR., et al. (2012). Sensitivity to change and responsiveness of four balance measures for community-dwelling older adults. Physical therapy 92(3): 388-397.   GAIT: Distance walked: 431ft Assistive device utilized: None Level of assistance: SBA Comments: easily fatigued, on and off min guard on corners for balance but completed most of distance with SBA                                                                                                                                 TREATMENT DATE:    03/05/24  Nustep L5x8 minutes all four extremities, seat 8  Goblet hold 4# STS power up/3 second eccentric lower down x10  Forward step ups onto 4 inch box on blue foam pad x10 B Hip hikes x12 B Hip hikes + ABD x12 B  Rocker board AP x2 minutes up to MinA for balance  One foot on BOSU/other on floor 3x30 seconds B     03/02/24  PSFS update  Nustep L5x8 minutes all four extremities, seat 8   Tandem stance 3x30 seconds B blue foam pad Side steps on blue foam pad x4 laps  occasional posterior LOB One foot on BOSU/other on floor 3x30 seconds B     For functional strength: - Forward step ups 6 inch x15 B - Lateral step ups 6 inch x15 B    02/28/24 NuStep L 5 x 7 min 20lb resisted gait 4 way x 4 each HS 20lb 2x12 Leg Ext 5lb 2x10 Sit to stand holding 3lb dumbbell 2x10 Bridges 2x10 Feet on ball K2C, rotation, small bridge, isometric abs Passive stretch HS and  piriformis  02/26/24 NuStep L 5 x 7 min Sit to stand holding 3lb dumbbell 2x10 Standing march and abduction at counter 2x10 6in step ups x10 RLE weakness UE assist at times HS curls 20lb 2x10 Leg Ext 5lb 2x10 Side steps on and off airex      PATIENT EDUCATION:  Education details: exam findings, POC, HEP, lots of encouragement to get more active and goal of PT being to transition to long term independent gym program  Person educated: Patient Education method: Explanation, Demonstration, and Handouts Education comprehension: verbalized understanding, returned demonstration, and needs further education  HOME EXERCISE PROGRAM: Access Code: [email or web address] URL: [email or web address] Date: 02/26/2024 Prepared by: Tanda Sorrow  Exercises - Sit to Stand  - 1 x daily - 7 x weekly - 3 sets - 10 reps - Standing March with Counter Support  - 1 x daily - 7 x weekly - 3 sets - 10 reps - Standing Hip Abduction with Unilateral Counter Support  - 1 x daily - 7 x weekly - 3 sets - 10 reps  ASSESSMENT:  CLINICAL IMPRESSION:     Joy [name] had a little less energy today due to not sleeping well last night. Continued working on functional strength, balance, functional activity tolerance  with rest breaks and activity modifications PRN. Did well today, just fatigued.    Patient is a 83 y.o. F who was seen today for physical therapy evaluation and treatment for  Diagnosis  R26.81 (ICD-10-CM) - Unsteadiness on feet  . Joy [name] is very well known to this clinic, and was just discharged about a month ago. Joy [name] has not been compliant with HEP. Will attempt to improve balance and function with ultimate goal of transition to independent exercise program.   OBJECTIVE IMPAIRMENTS: Abnormal gait, decreased activity tolerance, decreased balance, decreased cognition, decreased knowledge of use of DME, decreased mobility, difficulty walking, decreased strength, decreased safety awareness, and pain.   ACTIVITY  LIMITATIONS: carrying, lifting, standing, stairs, transfers, and locomotion level  PARTICIPATION LIMITATIONS: driving, shopping, community activity, and yard work  PERSONAL FACTORS: Age, Behavior pattern, Education, Fitness, Past/current experiences, Social background, and Time since onset of injury/illness/exacerbation are also affecting patient's functional outcome.   REHAB POTENTIAL: Fair return to PT after being DCed about a month ago, poor compliance with HEP and sedentary lifestyle   CLINICAL DECISION MAKING: Stable/uncomplicated  EVALUATION COMPLEXITY: Low   GOALS: Goals reviewed with patient? No  SHORT TERM GOALS: Target date: 03/03/2024   Will be compliant with appropriate progressive HEP  Baseline: Goal status: ONGOING 03/05/24  2.  Will name 3 ways to reduce fall risk at home and in the community  Baseline:  Goal status: 02/20/24 MET  3.  Will complete 5xSTS in 15 seconds or less  Baseline:  Goal status: 02/20/24 MET    LONG TERM GOALS: Target date: 03/24/2024    MMT to improve by one grade in all weak groups  Baseline:  Goal status: INITIAL  2.  Will score at least 18 on DGI to show  reduced fall risk  Baseline:  Goal status: INITIAL  3.  Will ambulate at least 669ft in with minimal fatigue to show improved community access and activity tolerance  Baseline:  Goal status: INITIAL  4.  Pain to generally be no more than 5/10 at worst  Baseline:  Goal status: Progressing 02/26/24  5.  PSFS to improve by 2 points  Baseline:  Goal status: MET 03/02/24  6.  Will be compliant with appropriate advanced HEP vs group gym programming  Baseline:  Goal status: INITIAL   PLAN:  PT FREQUENCY: 2x/week  PT DURATION: 6 weeks  PLANNED INTERVENTIONS: 97750- Physical Performance Testing, 97110-Therapeutic exercises, 97530- Therapeutic activity, 97112- Neuromuscular re-education, 97535- Self Care, [id number]- Manual therapy, and [id number]- Gait training  PLAN FOR NEXT  SESSION: functional activity tolerance, strength, balance, encourage gym classes/group classes for activity on her own/work towards transition to [facility] . HEP update and MMT next time    Josette Rough, PT, DPT 03/05/24 1:43 PM

## 2024-03-06 ENCOUNTER — Encounter (INDEPENDENT_AMBULATORY_CARE_PROVIDER_SITE_OTHER): Payer: Self-pay

## 2024-03-09 ENCOUNTER — Ambulatory Visit: Admitting: Physical Therapy

## 2024-03-12 ENCOUNTER — Encounter: Payer: Self-pay | Admitting: Physical Therapy

## 2024-03-12 ENCOUNTER — Ambulatory Visit: Admitting: Physical Therapy

## 2024-03-12 DIAGNOSIS — G8929 Other chronic pain: Secondary | ICD-10-CM | POA: Diagnosis not present

## 2024-03-12 DIAGNOSIS — M6281 Muscle weakness (generalized): Secondary | ICD-10-CM | POA: Diagnosis not present

## 2024-03-12 DIAGNOSIS — R262 Difficulty in walking, not elsewhere classified: Secondary | ICD-10-CM | POA: Diagnosis not present

## 2024-03-12 DIAGNOSIS — R2681 Unsteadiness on feet: Secondary | ICD-10-CM

## 2024-03-12 DIAGNOSIS — M5442 Lumbago with sciatica, left side: Secondary | ICD-10-CM | POA: Diagnosis not present

## 2024-03-12 DIAGNOSIS — M5441 Lumbago with sciatica, right side: Secondary | ICD-10-CM | POA: Diagnosis not present

## 2024-03-12 NOTE — Progress Notes (Signed)
   03/12/24 0001  Dynamic Gait Index  Level Surface 3  Change in Gait Speed 3  Gait with Horizontal Head Turns 3  Gait with Vertical Head Turns 2  Gait and Pivot Turn 2  Step Over Obstacle 2  Step Around Obstacles 3  Steps 1  Total Score 19

## 2024-03-12 NOTE — Therapy (Signed)
 OUTPATIENT PHYSICAL THERAPY LOWER EXTREMITY TREATMENT   Patient Name: Joy [name] MRN: [id number] DOB:10/25/1940, 83 y.o., female Today's Date: 03/12/2024  END OF SESSION:  PT End of Session - 03/12/24 1347     Visit Number 8    Date for PT Re-Evaluation 03/24/24    Authorization Type MCR and [employer]    Authorization Time Period 02/11/24 to 03/24/24    Progress Note Due on Visit 10    PT Start Time 1304    PT Stop Time 1345    PT Time Calculation (min) 41 min    Activity Tolerance Patient tolerated treatment well    Behavior During Therapy [facility] for tasks assessed/performed             Past Medical History:  Diagnosis Date   Ankylosing spondylitis (HCC)    Anxiety    Arthritis    RHEUMATOID   Back pain    Bronchitis    Constipation    COPD (chronic obstructive pulmonary disease) (HCC)    CXR 01/11/18 showed mild COPD and chronic bronchitis   CTS (carpal tunnel syndrome)    Dry eye    Dry mouth    Dysrhythmia    irregularity unknown at this time - being evaluated by cardiology   Essential hypertension 11/24/2015   GERD (gastroesophageal reflux disease)    HLA B27 (HLA B27 positive)    Hypercholesteremia    Hyperlipidemia 11/24/2015   Hypertension    IBS (irritable bowel syndrome)    Joint pain    Neuropathy    OAB (overactive bladder)    Obesity    Osteoarthritis    Osteoporosis    Pre-diabetes    Rheumatoid arthritis (HCC) 11/24/2015   Sciatica    Seasonal allergies    Spinal stenosis    Past Surgical History:  Procedure Laterality Date   ABDOMINAL HYSTERECTOMY  1995   BACK SURGERY     BREAST SURGERY     REDUCTION   CATARACT EXTRACTION W/PHACO  08/01/2012   Procedure: CATARACT EXTRACTION PHACO AND INTRAOCULAR LENS PLACEMENT (IOC);  Surgeon: Gaither Quan, MD;  Location: [facility] OR;  Service: Ophthalmology;  Laterality: Right;   EYE SURGERY Bilateral    cataract   HERNIA REPAIR     RIGHT ING.   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   Medication monitoring encounter 09/15/2021   Pain of left hip joint 08/10/2020   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   Prediabetes 02/01/2020   Body mass index (BMI) 36.0-36.9, adult 02/01/2020   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   Inflammation of sacroiliac joint (HCC) 10/06/2018   Chronic bronchitis (HCC) 09/25/2018   Mucopurulent chronic bronchitis (HCC) 08/21/2018   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   Spondylosis 06/13/2018   Status post lumbar spinal fusion 06/13/2018   Status post lumbar spine surgery for decompression of spinal cord 06/13/2018  History of total knee replacement, 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   History of total knee arthroplasty, right 09/20/2017   OA (osteoarthritis) of knee 12/26/2015   Essential hypertension 11/24/2015   Hyperlipidemia 11/24/2015   Rheumatoid arthritis (HCC) 11/24/2015   Spinal stenosis of lumbar region 08/24/2014   Lumbar spondylosis 07/21/2014    PCP: Katina Pfeiffer PA-C  REFERRING PROVIDER: Katina Pfeiffer, PA-C  REFERRING DIAG:  Diagnosis  R26.81 (ICD-10-CM) - Unsteadiness on feet    THERAPY DIAG:  Chronic bilateral low back pain with bilateral sciatica  Difficulty in walking, not elsewhere classified  Unsteadiness on feet  Muscle weakness (generalized)  Rationale for  Evaluation and Treatment: Rehabilitation  ONSET DATE: chronic   SUBJECTIVE:   SUBJECTIVE STATEMENT:    Nothing new, grand-daughter's cat ran away but was gone all day and came home   EVAL: I was just here and DCed in May, now back with a new referral. My balance is really bad and I have sciatica in my left leg. Need a TKR on the other side. Had two falls in the past month or so. Missed a step one time, other time I was inside the house and tripped in my house shoes. Its basically the same thing I came here with as last time, I didn't keep up my HEP from last round of PT.   PERTINENT HISTORY: See above  PAIN:  Are you having pain? 3-4/10 B knees, nerve and joint pain, not taking care of knees makes them worse, nothing really makes them feel better except for injections   PRECAUTIONS: None  RED FLAGS: None   WEIGHT BEARING RESTRICTIONS: No  FALLS:  Has patient fallen in last 6 months? Yes. 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    PATIENT SURVEYS:   THE PATIENT SPECIFIC FUNCTIONAL SCALE  Place score of 0-10 (0 = unable to perform activity and 10 = able to perform activity at the same level as before injury or problem)  Activity Date: Eval 02/11/24 03/02/24   Stairs  4 8   2. Yard work  2 2   3. Balance  3 5   4.      Total Score 3 5     Total Score = Sum of activity scores/number of activities  Minimally Detectable Change: 3 points (for single activity); 2 points (for average score)  [facility] (nd). The  Patient Specific Functional Scale . Retrieved from [email or web address]   COGNITION: Overall cognitive status: mild impairments in cognition and STM noted, endorses forgetting familiar routes when driving            LOWER EXTREMITY MMT:  MMT Right eval Left eval Right 03/12/24 Left 03/12/24  Hip flexion 3+ 4 3+ 3+  Hip extension      Hip abduction 4 4 4 4   Hip adduction      Hip internal rotation      Hip external rotation      Knee flexion 4+ 4+ 5 5  Knee extension 4+ 4+ 5 5  Ankle dorsiflexion      Ankle  plantarflexion      Ankle inversion      Ankle eversion       (Blank rows = not tested)    FUNCTIONAL TESTS:  5 times sit to stand: 23 seconds no UEs  3 minute walk test: 419ft no device  Dynamic Gait Index:    GAIT: Distance walked: 471ft Assistive device utilized: None Level of assistance: SBA Comments: easily fatigued, on and off min guard on corners for balance but completed most of distance with SBA     03/12/24 0001  Dynamic Gait Index  Level Surface 3  Change in Gait Speed 3  Gait with Horizontal Head Turns 3  Gait with Vertical Head Turns 2  Gait and Pivot Turn 2  Step Over Obstacle 2  Step Around Obstacles 3  Steps 1  Total Score 19                                                                                                                                TREATMENT DATE:   03/12/24  Nustep L6x8 minutes all four extremities, seat 8  MMT DGI  Goal review   Tandem walk x4 laps in // bars  Tandem stance blue foam pad 3x30 seconds B  Forward step overs half and full foam rolls x3 laps Lateral step overs half and full foam rolls x3 laps  03/05/24  Nustep L5x8 minutes all four extremities, seat 8  Goblet hold 4# STS power up/3 second eccentric lower down x10  Forward step ups onto 4 inch box on blue foam pad x10 B Hip hikes x12 B Hip hikes + ABD x12 B  Rocker board AP x2 minutes  up to MinA for balance  One foot on BOSU/other on floor 3x30 seconds B        PATIENT EDUCATION:  Education details: exam findings, POC, HEP, lots of encouragement to get more active and goal of PT being to transition to long term independent gym program  Person educated: Patient Education method: Explanation, Demonstration, and Handouts Education comprehension: verbalized understanding, returned demonstration, and needs further education  HOME EXERCISE PROGRAM:  Access Code: [email or web address] URL: [email or web address] Date: 03/12/2024 Prepared by: Josette Rough  Exercises - Sit to Stand  - 1 x daily - 7 x weekly - 3 sets - 10 reps - Standing March with Counter Support  - 1 x daily - 7 x weekly - 3 sets - 10 reps - Standing Hip Abduction with Unilateral Counter Support  - 1 x daily - 7 x weekly - 3 sets - 10 reps - Tandem Walking with Counter Support  - 1 x daily - 7 x weekly - 1 sets - 5 reps - Standing Tandem Balance with Unilateral Counter Support  - 1 x daily - 7 x weekly - 1 sets - 6 reps - Forward Step Over with Counter Support  - 1 x daily - 7 x weekly - 1 sets -  3 reps - Side Step Overs with Cones and Counter Support  - 1 x daily - 7 x weekly - 1 sets - 5 reps  ASSESSMENT:  CLINICAL IMPRESSION:     Updated MMT and DGI today, she continues to make good progress. Unfortunately she does continue to have some issues with her knees- waiting for formal date for R knee revision. Otherwise continued to challenge her as appropriate today, she remains motivated to improve. She is requesting to finish PT on 7/31 due to multiple other ongoing medical issues.    Patient is a 83 y.o. F who was seen today for physical therapy evaluation and treatment for  Diagnosis  R26.81 (ICD-10-CM) - Unsteadiness on feet  . She is very well known to this clinic, and was just discharged about a month ago. She has not been compliant with HEP. [name] attempt to improve balance and function with  ultimate goal of transition to independent exercise program.   OBJECTIVE IMPAIRMENTS: Abnormal gait, decreased activity tolerance, decreased balance, decreased cognition, decreased knowledge of use of DME, decreased mobility, difficulty walking, decreased strength, decreased safety awareness, and pain.   ACTIVITY LIMITATIONS: carrying, lifting, standing, stairs, transfers, and locomotion level  PARTICIPATION LIMITATIONS: driving, shopping, community activity, and yard work  PERSONAL FACTORS: Age, Behavior pattern, Education, Fitness, Past/current experiences, Social background, and Time since onset of injury/illness/exacerbation are also affecting patient's functional outcome.   REHAB POTENTIAL: Fair return to PT after being DCed about a month ago, poor compliance with HEP and sedentary lifestyle   CLINICAL DECISION MAKING: Stable/uncomplicated  EVALUATION COMPLEXITY: Low   GOALS: Goals reviewed with patient? No  SHORT TERM GOALS: Target date: 03/03/2024   [name] be compliant with appropriate progressive HEP  Baseline: Goal status: ONGOING 03/05/24  2.  [name] name 3 ways to reduce fall risk at home and in the community  Baseline:  Goal status: 02/20/24 MET  3.  [name] complete 5xSTS in 15 seconds or less  Baseline:  Goal status: 02/20/24 MET    LONG TERM GOALS: Target date: 03/24/2024    MMT to improve by one grade in all weak groups  Baseline:  Goal status: ONGOING 03/12/24  2.  [name] score at least 18 on DGI to show reduced fall risk  Baseline:  Goal status:  MET 03/12/24  3.  [name] ambulate at least 629ft in with minimal fatigue to show improved community access and activity tolerance  Baseline:  Goal status: INITIAL  4.  Pain to generally be no more than 5/10 at worst  Baseline:  Goal status: Progressing 02/26/24  5.  PSFS to improve by 2 points  Baseline:  Goal status: MET 03/02/24  6.  [name] be compliant with appropriate advanced HEP vs group gym programming   Baseline:  Goal status: INITIAL   PLAN:  PT FREQUENCY: 2x/week  PT DURATION: 6 weeks  PLANNED INTERVENTIONS: 97750- Physical Performance Testing, 97110-Therapeutic exercises, 97530- Therapeutic activity, 97112- Neuromuscular re-education, 97535- Self Care, [id number]- Manual therapy, and [id number]- Gait training  PLAN FOR NEXT SESSION: functional activity tolerance, strength, balance, encourage gym classes/group classes for activity on her own/work towards transition to [facility] . Planning for DC 03/19/24, keep building out HEP in preparation    Josette Rough, PT, DPT 03/12/24 1:47 PM

## 2024-03-13 ENCOUNTER — Other Ambulatory Visit: Payer: Self-pay | Admitting: Pulmonary Disease

## 2024-03-16 ENCOUNTER — Ambulatory Visit: Admitting: Physical Therapy

## 2024-03-17 ENCOUNTER — Other Ambulatory Visit (HOSPITAL_BASED_OUTPATIENT_CLINIC_OR_DEPARTMENT_OTHER): Payer: Self-pay | Admitting: Family

## 2024-03-17 DIAGNOSIS — E785 Hyperlipidemia, unspecified: Secondary | ICD-10-CM

## 2024-03-18 ENCOUNTER — Ambulatory Visit (HOSPITAL_BASED_OUTPATIENT_CLINIC_OR_DEPARTMENT_OTHER): Payer: Self-pay | Admitting: Pulmonary Disease

## 2024-03-18 NOTE — Telephone Encounter (Signed)
 Patient called x 1 to discuss symptoms, LVM. Will attempt contact at a later date.                       Message from Isabell A sent at 03/18/2024  4:25 PM EDT  Experiencing coughing, drooling, mucus is coming out her nose & throat, no fever - just concerning.   Callback number: [phone]

## 2024-03-19 ENCOUNTER — Ambulatory Visit: Admitting: Physical Therapy

## 2024-03-19 ENCOUNTER — Encounter: Payer: Self-pay | Admitting: Physical Therapy

## 2024-03-19 DIAGNOSIS — M5442 Lumbago with sciatica, left side: Secondary | ICD-10-CM | POA: Diagnosis not present

## 2024-03-19 DIAGNOSIS — G8929 Other chronic pain: Secondary | ICD-10-CM

## 2024-03-19 DIAGNOSIS — R262 Difficulty in walking, not elsewhere classified: Secondary | ICD-10-CM | POA: Diagnosis not present

## 2024-03-19 DIAGNOSIS — M6281 Muscle weakness (generalized): Secondary | ICD-10-CM

## 2024-03-19 DIAGNOSIS — R2681 Unsteadiness on feet: Secondary | ICD-10-CM | POA: Diagnosis not present

## 2024-03-19 DIAGNOSIS — M5441 Lumbago with sciatica, right side: Secondary | ICD-10-CM | POA: Diagnosis not present

## 2024-03-19 NOTE — Therapy (Signed)
 OUTPATIENT PHYSICAL THERAPY LOWER EXTREMITY TREATMENT   Patient Name: Joy [name] MRN: [id number] DOB:June 27, 1941, 83 y.o., female Today's Date: 03/19/2024  END OF SESSION:  PT End of Session - 03/19/24 1100     Visit Number 9    Date for PT Re-Evaluation 03/24/24    PT Start Time 1100    PT Stop Time 1145    PT Time Calculation (min) 45 min    Activity Tolerance Patient tolerated treatment well    Behavior During Therapy [facility] for tasks assessed/performed             Past Medical History:  Diagnosis Date   Ankylosing spondylitis (HCC)    Anxiety    Arthritis    RHEUMATOID   Back pain    Bronchitis    Constipation    COPD (chronic obstructive pulmonary disease) (HCC)    CXR 01/11/18 showed mild COPD and chronic bronchitis   CTS (carpal tunnel syndrome)    Dry eye    Dry mouth    Dysrhythmia    irregularity unknown at this time - being evaluated by cardiology   Essential hypertension 11/24/2015   GERD (gastroesophageal reflux disease)    HLA B27 (HLA B27 positive)    Hypercholesteremia    Hyperlipidemia 11/24/2015   Hypertension    IBS (irritable bowel syndrome)    Joint pain    Neuropathy    OAB (overactive bladder)    Obesity    Osteoarthritis    Osteoporosis    Pre-diabetes    Rheumatoid arthritis (HCC) 11/24/2015   Sciatica    Seasonal allergies    Spinal stenosis    Past Surgical History:  Procedure Laterality Date   ABDOMINAL HYSTERECTOMY  1995   BACK SURGERY     BREAST SURGERY     REDUCTION   CATARACT EXTRACTION W/PHACO  08/01/2012   Procedure: CATARACT EXTRACTION PHACO AND INTRAOCULAR LENS PLACEMENT (IOC);  Surgeon: Gaither Quan, MD;  Location: [facility] OR;  Service: Ophthalmology;  Laterality: Right;   EYE SURGERY Bilateral    cataract   HERNIA REPAIR     RIGHT ING.   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   Medication monitoring encounter 09/15/2021   Pain of left hip joint 08/10/2020   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   Prediabetes 02/01/2020   Body mass index (BMI) 36.0-36.9, adult 02/01/2020   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   Inflammation of sacroiliac joint (HCC) 10/06/2018   Chronic bronchitis (HCC) 09/25/2018   Mucopurulent chronic bronchitis (HCC) 08/21/2018   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   Spondylosis 06/13/2018   Status post lumbar spinal fusion 06/13/2018   Status post lumbar spine surgery for decompression of spinal cord 06/13/2018   History of total knee replacement, 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   History of total knee arthroplasty, right 09/20/2017   OA (osteoarthritis) of knee 12/26/2015  Essential hypertension 11/24/2015   Hyperlipidemia 11/24/2015   Rheumatoid arthritis (HCC) 11/24/2015   Spinal stenosis of lumbar region 08/24/2014   Lumbar spondylosis 07/21/2014    PCP: Katina Pfeiffer PA-C  REFERRING PROVIDER: Katina Pfeiffer, PA-C  REFERRING DIAG:  Diagnosis  R26.81 (ICD-10-CM) - Unsteadiness on feet    THERAPY DIAG:  Chronic bilateral low back pain with bilateral sciatica  Difficulty in walking, not elsewhere classified  Muscle weakness (generalized)  Unsteadiness on feet  Rationale for Evaluation and Treatment: Rehabilitation  ONSET DATE: chronic   SUBJECTIVE:   SUBJECTIVE STATEMENT:    Doing pretty  good, woke up with some L knee pain this morning   EVAL: I was just here and DCed in May, now back with a new referral. My balance is really bad and I have sciatica in my left leg. Need a TKR on the other side. Had two falls in the past month or so. Missed a step one time, other time I was inside the house and tripped in my house shoes. Its basically the same thing I came here with as last time, I didn't keep up my HEP from last round of PT.   PERTINENT HISTORY: See above  PAIN:  Are you having pain? 3-4/10 B knees, nerve and joint pain, not taking care of knees makes them worse, nothing really makes them feel better except for injections   PRECAUTIONS: None  RED FLAGS: None   WEIGHT BEARING RESTRICTIONS: No  FALLS:  Has patient fallen in last 6 months? Yes. 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    PATIENT SURVEYS:   THE PATIENT SPECIFIC FUNCTIONAL SCALE  Place score of 0-10 (0 = unable to perform activity and 10 = able to perform activity at the same level as before injury or problem)  Activity Date: Eval 02/11/24 03/02/24   Stairs  4 8   2. Yard work  2 2   3. Balance  3 5   4.      Total Score 3 5     Total Score = Sum of activity scores/number of activities  Minimally Detectable Change: 3 points (for single activity); 2 points (for average score)  [facility] (nd). The Patient Specific Functional Scale . Retrieved from  [email or web address]   COGNITION: Overall cognitive status: mild impairments in cognition and STM noted, endorses forgetting familiar routes when driving            LOWER EXTREMITY MMT:  MMT Right eval Left eval Right 03/12/24 Left 03/12/24  Hip flexion 3+ 4 3+ 3+  Hip extension      Hip abduction 4 4 4 4   Hip adduction      Hip internal rotation      Hip external rotation      Knee flexion 4+ 4+ 5 5  Knee extension 4+ 4+ 5 5  Ankle dorsiflexion      Ankle plantarflexion      Ankle inversion      Ankle eversion       (Blank rows = not tested)  FUNCTIONAL TESTS:  5 times sit to stand: 23 seconds no UEs  3 minute walk test: 466ft no device  Dynamic Gait Index:    GAIT: Distance walked: 439ft Assistive device utilized: None Level of assistance: SBA Comments: easily fatigued, on and off min guard on corners for balance but completed most of distance with SBA     03/12/24 0001  Dynamic Gait Index  Level Surface 3  Change in Gait Speed 3  Gait with Horizontal Head Turns 3  Gait with Vertical Head Turns 2  Gait and Pivot Turn 2  Step Over Obstacle 2  Step Around Obstacles 3  Steps 1  Total Score 19                                                                                                                                TREATMENT DATE:  03/19/24 Nustep L6x8 minutes all four extremities Sit to stand LE on mat 2x10 HS curls 25lb 2x10 Leg Ext 10lb 2x10 Rows & Ext green 2x10 Alt 6in box taps  Sin step ups   03/12/24  Nustep L6x8 minutes all four extremities, seat 8  MMT DGI  Goal review   Tandem walk x4 laps in // bars  Tandem stance blue foam pad 3x30 seconds B  Forward step overs half and full foam rolls x3 laps Lateral step overs half and full foam rolls x3 laps  03/05/24  Nustep L5x8 minutes all four extremities, seat 8  Goblet hold 4# STS power up/3 second eccentric lower down x10   Forward step ups onto 4 inch box on blue foam pad x10 B Hip hikes x12 B Hip hikes + ABD x12 B  Rocker board AP x2 minutes up to MinA for balance  One foot on BOSU/other on floor 3x30 seconds B        PATIENT EDUCATION:  Education details: exam findings, POC, HEP, lots of encouragement to get more active and goal of PT being to transition to long term independent gym program  Person educated: Patient Education method: Explanation, Demonstration, and Handouts Education comprehension: verbalized understanding, returned demonstration, and needs further education  HOME EXERCISE PROGRAM:  Access Code: [email or web address] URL: [email or web address] Date: 03/12/2024 Prepared by: Josette Rough  Exercises - Sit to Stand  - 1 x daily - 7 x weekly - 3 sets - 10 reps - Standing March with Counter Support  - 1 x daily - 7 x weekly - 3 sets - 10 reps - Standing Hip Abduction with Unilateral Counter Support  - 1 x daily - 7 x weekly - 3 sets - 10 reps - Tandem Walking with Counter Support  - 1 x daily - 7 x weekly - 1 sets - 5 reps - Standing Tandem Balance with Unilateral Counter Support  - 1 x daily - 7 x weekly - 1 sets - 6 reps - Forward Step Over with Counter Support  - 1 x daily -  7 x weekly - 1 sets - 3 reps - Side Step Overs with Cones and Counter Support  - 1 x daily - 7 x weekly - 1 sets - 5 reps  ASSESSMENT:  CLINICAL IMPRESSION:     Pt enters doing well, Unfortunately she does continue to have some issues with her knees- waiting for formal date for R knee revision. Otherwise continued to challenge her as appropriate today, she remains motivated to improve. Cues for sequencing needed with step ups . Cue needed for full ROM with curls and ext.    Patient is a 83 y.o. F who was seen today for physical therapy evaluation and treatment for  Diagnosis  R26.81 (ICD-10-CM) - Unsteadiness on feet  . She is very well known to this clinic, and was just discharged about a month ago.  She has not been compliant with HEP. Will attempt to improve balance and function with ultimate goal of transition to independent exercise program.   OBJECTIVE IMPAIRMENTS: Abnormal gait, decreased activity tolerance, decreased balance, decreased cognition, decreased knowledge of use of DME, decreased mobility, difficulty walking, decreased strength, decreased safety awareness, and pain.   ACTIVITY LIMITATIONS: carrying, lifting, standing, stairs, transfers, and locomotion level  PARTICIPATION LIMITATIONS: driving, shopping, community activity, and yard work  PERSONAL FACTORS: Age, Behavior pattern, Education, Fitness, Past/current experiences, Social background, and Time since onset of injury/illness/exacerbation are also affecting patient's functional outcome.   REHAB POTENTIAL: Fair return to PT after being DCed about a month ago, poor compliance with HEP and sedentary lifestyle   CLINICAL DECISION MAKING: Stable/uncomplicated  EVALUATION COMPLEXITY: Low   GOALS: Goals reviewed with patient? No  SHORT TERM GOALS: Target date: 03/03/2024   Will be compliant with appropriate progressive HEP  Baseline: Goal status: ONGOING 03/05/24  2.  Will name 3 ways to reduce fall risk at home and in the community  Baseline:  Goal status: 02/20/24 MET  3.  Will complete 5xSTS in 15 seconds or less  Baseline:  Goal status: 02/20/24 MET    LONG TERM GOALS: Target date: 03/24/2024    MMT to improve by one grade in all weak groups  Baseline:  Goal status: ONGOING 03/12/24  2.  Will score at least 18 on DGI to show reduced fall risk  Baseline:  Goal status:  MET 03/12/24  3.  Will ambulate at least 691ft in with minimal fatigue to show improved community access and activity tolerance  Baseline:  Goal status: Progressing 03/19/24  4.  Pain to generally be no more than 5/10 at worst  Baseline:  Goal status: Progressing 02/26/24  5.  PSFS to improve by 2 points  Baseline:  Goal status:  MET 03/02/24  6.  Will be compliant with appropriate advanced HEP vs group gym programming  Baseline:  Goal status: INITIAL   PLAN:  PT FREQUENCY: 2x/week  PT DURATION: 6 weeks  PLANNED INTERVENTIONS: 97750- Physical Performance Testing, 97110-Therapeutic exercises, 97530- Therapeutic activity, [id number]- Neuromuscular re-education, 97535- Self Care, [id number]- Manual therapy, and [id number]- Gait training  PLAN FOR NEXT SESSION: DC 03/19/24  PHYSICAL THERAPY DISCHARGE SUMMARY  Visits from Start of Care: 9  Patient agrees to discharge. Patient goals were partially met. Patient is being discharged due to being pleased with the current functional level.  Tanda Sorrow, PTA 03/19/24 11:00 AM

## 2024-03-20 ENCOUNTER — Ambulatory Visit (HOSPITAL_BASED_OUTPATIENT_CLINIC_OR_DEPARTMENT_OTHER): Payer: Self-pay | Admitting: Pulmonary Disease

## 2024-03-20 NOTE — Telephone Encounter (Signed)
 Patient had called back to speak with Nurse Triage-when agent went to transfer the call, patient was no longer on the call. Phone call placed to patient and received her voicemail. This RN left [name] voicemail for patient to call back to Nurse Triage to speak with any nurse. Will route to callbacks.   Copied from CRM [id number]. Topic: Clinical - Pink Word Triage >> Mar 18, 2024  4:24 PM Joy [name] wrote: Reason for Triage: Experiencing coughing, drooling, mucus is coming out her nose & throat, no fever - just concerning.    Callback number: [phone] >> Mar 20, 2024  2:18 PM Benton KIDD wrote: Patient returning call from nurse triage . Getting patient over to nurse triage >> Mar 18, 2024  4:25 PM Joy [name] wrote: Experiencing coughing, drooling, mucus is coming out her nose & throat, no fever - just concerning.    Callback number: [phone]

## 2024-03-20 NOTE — Telephone Encounter (Signed)
 FYI Only or Action Required?: FYI only for provider.  Patient is followed in Pulmonology for chronic bronchitis, last seen on 07/31/2023 by Kassie Acquanetta Bradley, MD.  Called Nurse Triage reporting Cough and Drooling.  Symptoms began several weeks ago.  Interventions attempted: Maintenance inhaler and Increased fluids/rest.  Symptoms are: unchanged.  Triage Disposition: Call PCP Now, Call Specialist When Office Open  Patient/caregiver understands and will follow disposition?: Unsure       Reason for Disposition  [1] Known COPD or other severe lung disease (i.e., bronchiectasis, cystic fibrosis, lung surgery) AND [2] symptoms getting worse (i.e., increased sputum purulence or amount, increased breathing difficulty  [1] Chest pain lasts < 5 minutes AND [2] NO chest pain or cardiac symptoms (e.g., breathing difficulty, sweating) now  (Exception: Chest pains that last only a few seconds.)  Additional Information  Commented on: Answer Assessment    PCP is [facility]. Pt reports CP has been an ongoing, chronic issue for about 6 mos, PCP is aware and was supposed to refer to specialist. Pt reports referral was never made. Triager strongly advised pt to call PCP to follow up on chronic CP since PMH is limited from PCP. Patient verbalized understanding and to call back with worsening symptoms.  Answer Assessment - Initial Assessment Questions E2C2 Pulmonary Triage - Initial Assessment Questions Chief Complaint (e.g., cough, sob, wheezing, fever, chills, sweat or additional symptoms) *Go to specific symptom protocol after initial questions. Worsening productive cough, nasal brown/green discharge Recent UC a few weeks ago - gave Rx without relief Reports ENT appt next   How long have symptoms been present? A few weeks  Have you tested for COVID or Flu? Note: If not, ask patient if a home test can be taken. If so, instruct patient to call back for positive results. No  MEDICINES:    Have you used any OTC meds to help with symptoms? Yes If yes, ask What medications? Dayquil Nyquil  Have you used your inhalers/maintenance medication? Yes If yes, What medications? Stiolto - 2 puffs daily Albuterol  PRN - has not used recently Economist reviewed/reinforced albuterol  usage and SIG.    If inhaler, ask How many puffs and how often? Note: Review instructions on medication in the chart. See above  OXYGEN: Do you wear supplemental oxygen? No If yes, How many liters are you supposed to use? N/a  Do you monitor your oxygen levels? No If yes, What is your reading (oxygen level) today? N/a - does not have  What is your usual oxygen saturation reading?  (Note: Pulmonary O2 sats should be 90% or greater) N/a     1. ONSET: When did the cough begin?      See above 2. SEVERITY: How bad is the cough today?      See above 3. SPUTUM: Describe the color of your sputum (e.g., none, dry cough; clear, white, yellow, green)     See above 4. HEMOPTYSIS: Are you coughing up any blood? If Yes, ask: How much? (e.g., flecks, streaks, tablespoons, etc.)     denies 5. DIFFICULTY BREATHING: Are you having difficulty breathing? If Yes, ask: How bad is it? (e.g., mild, moderate, severe)      Mild - during coughing fits Triager does not appreciate audible SOB/wheezing during call. Pt is speaking in full sentences.  6. FEVER: Do you have a fever? If Yes, ask: What is your temperature, how was it measured, and when did it start?     denies 7. CARDIAC HISTORY: Do  you have any history of heart disease? (e.g., heart attack, congestive heart failure)      denies 8. LUNG HISTORY: Do you have any history of lung disease?  (e.g., pulmonary embolus, asthma, emphysema)     Chronic bronchitis 9. PE RISK FACTORS: Do you have a history of blood clots? (or: recent major surgery, recent prolonged travel, bedridden)     denies 10. OTHER SYMPTOMS: Do you  have any other symptoms? (e.g., runny nose, wheezing, chest pain)       Runny nose, L side CP - 6 mos ago, denies current CP/tenderness, endorses last episode of L CP was yesterday - but it was brief -- reports is awaiting referral from PCP Denies other sx 11. PREGNANCY: Is there any chance you are pregnant? When was your last menstrual period?       N/a 12. TRAVEL: Have you traveled out of the country in the last month? (e.g., travel history, exposures)       N/a  Protocols used: Cough - Acute Productive-A-AH, Chest Pain-A-AH

## 2024-03-23 DIAGNOSIS — H9203 Otalgia, bilateral: Secondary | ICD-10-CM | POA: Diagnosis not present

## 2024-03-23 DIAGNOSIS — R42 Dizziness and giddiness: Secondary | ICD-10-CM | POA: Diagnosis not present

## 2024-03-23 DIAGNOSIS — M26609 Unspecified temporomandibular joint disorder, unspecified side: Secondary | ICD-10-CM | POA: Diagnosis not present

## 2024-03-23 NOTE — Telephone Encounter (Signed)
 FYI Pt has an appt on 03/24/2024

## 2024-03-23 NOTE — Progress Notes (Unsigned)
 @Patient  ID: Joy [name], female    DOB: Feb 27, 1941, 83 y.o.   MRN: [id number]  No chief complaint on file.   Referring provider: Katina Pfeiffer, PA-C  HPI: 83 year old female, former smoker followed for chronic bronchitis and pseudomonas colonization. She is a patient of Dr. Laymond and last seen in office 07/31/2023. Past medical history significant for HTN, RA.   TEST/EVENTS:  08/21/2018 PFT: FVC 158, FEV! 153, ratio 78, TLC 104, DLCO 78 12/18/2022 CT chest: atherosclerosis. Mild cardiomegaly. Moderate-sized hiatal hernia. Airway thickening present, suggesting bronchitis or reactive airways disease. Prior lung nodules resolved. Stable 7x3x3 mm, RLL nodule. Not substantially changed over the last year. New 5 mm ground glass density right apical pulmonary nodule. No f/u recommended. Atherosclerosis. New subacute healing left anterior third rib fx.  07/31/2023: OV with Dr. Kassie. Poor appetite and weight loss; had stenosis on prior EGD and dilated 03/2023. Reports cough, minimal sputum but worsening nasal congestion. Still going to church and social activities. On LAMA/LABA with good control. Completed tobramycin  in 2023 for pseudomonas. May need to refer to ID if persistent. Start flonase. Has waxing and waning nodules; suspect all benign. Would favor surveillance.   03/23/2024: Today - follow up   Allergies  Allergen Reactions   Codeine Other (See Comments)    Lump in throat   Tape Hives and Rash    Paper tape only   Latex Rash   Nickel Rash    Bumps Also other metals    Immunization History  Administered Date(s) Administered   Fluad Quad(high Dose 65+) 04/21/2019, 05/20/2021   Influenza Split 05/22/2012, 04/16/2014   Influenza, High Dose Seasonal PF 05/20/2018, 05/24/2020   Influenza-Unspecified 04/26/2015, 06/16/2019   PFIZER(Purple Top)SARS-COV-2 Vaccination 09/11/2019, 09/25/2019, 05/14/2020, 11/25/2020   Pfizer Covid-19 Vaccine Bivalent Booster 42yrs & up  05/08/2021   Pneumococcal Conjugate-13 04/16/2014   Pneumococcal Polysaccharide-23 04/26/2015   Tdap 10/19/2014    Past Medical History:  Diagnosis Date   Ankylosing spondylitis (HCC)    Anxiety    Arthritis    RHEUMATOID   Back pain    Bronchitis    Constipation    COPD (chronic obstructive pulmonary disease) (HCC)    CXR 01/11/18 showed mild COPD and chronic bronchitis   CTS (carpal tunnel syndrome)    Dry eye    Dry mouth    Dysrhythmia    irregularity unknown at this time - being evaluated by cardiology   Essential hypertension 11/24/2015   GERD (gastroesophageal reflux disease)    HLA B27 (HLA B27 positive)    Hypercholesteremia    Hyperlipidemia 11/24/2015   Hypertension    IBS (irritable bowel syndrome)    Joint pain    Neuropathy    OAB (overactive bladder)    Obesity    Osteoarthritis    Osteoporosis    Pre-diabetes    Rheumatoid arthritis (HCC) 11/24/2015   Sciatica    Seasonal allergies    Spinal stenosis     Tobacco History: Social History   Tobacco Use  Smoking Status Former   Current packs/day: 0.00   Average packs/day: 1 pack/day for 35.0 years (35.0 ttl pk-yrs)   Types: Cigarettes   Start date: 05/28/1960   Quit date: 05/29/1995   Years since quitting: 28.8  Smokeless Tobacco Never   Counseling given: Not Answered   Outpatient Medications Prior to Visit  Medication Sig Dispense Refill   albuterol  (VENTOLIN  HFA) 108 (90 Base) MCG/ACT inhaler INHALE 2 PUFFS INTO THE LUNGS EVERY  4 HOURS AS NEEDED FOR WHEEZING OR SHORTNESS OF BREATH 6.7 g 0   amLODipine  (NORVASC ) 2.5 MG tablet TAKE 1 TABLET(2.5 MG) BY MOUTH DAILY 90 tablet 2   Apoaequorin (PREVAGEN PO) Take by mouth.     Cholecalciferol  (VITAMIN D ) 50 MCG (2000 UT) tablet Take 2,000 Units by mouth daily.     diphenhydrAMINE  (BENADRYL ) 25 MG tablet Take 25 mg by mouth daily as needed for allergies.     gabapentin  (NEURONTIN ) 300 MG capsule Take 300-600 mg by mouth See admin instructions. Take 300  mg in the morning, 300 mg in the afternoon, and 600 mg at night  1   irbesartan  (AVAPRO ) 300 MG tablet Take 1 tablet (300 mg total) by mouth daily. 30 tablet 0   Magnesium  250 MG TABS Take 1 tablet (250 mg total) by mouth daily. 30 tablet 0   Menthol , Topical Analgesic, (ICY HOT EX) Apply 1 Application topically daily as needed (pain).     naproxen sodium (ALEVE) 220 MG tablet Take 440 mg by mouth daily as needed (pain).     polyethylene glycol (MIRALAX  / GLYCOLAX ) 17 g packet Take 17 g by mouth daily as needed for moderate constipation.     Propylene Glycol (SYSTANE BALANCE) 0.6 % SOLN Place 1 drop into both eyes 2 (two) times daily as needed (dry eyes).     rosuvastatin  (CRESTOR ) 40 MG tablet TAKE 1 TABLET(40 MG) BY MOUTH DAILY 90 tablet 0   sertraline  (ZOLOFT ) 25 MG tablet Take 50 mg by mouth daily.      Tiotropium Bromide -Olodaterol (STIOLTO RESPIMAT ) 2.5-2.5 MCG/ACT AERS Inhale 2 puffs into the lungs daily.     Tiotropium Bromide -Olodaterol (STIOLTO RESPIMAT ) 2.5-2.5 MCG/ACT AERS Inhale 2 puffs into the lungs daily. 4 g 11   tobramycin , PF, (TOBI ) 300 MG/5ML nebulizer solution Take 5 mLs (300 mg total) by nebulization 2 (two) times daily. 280 mL 0   Varenicline Tartrate (TYRVAYA) 0.03 MG/ACT SOLN Place 1 spray into the nose daily.     vitamin B-12 (CYANOCOBALAMIN ) 100 MCG tablet Take 100 mcg by mouth daily.     No facility-administered medications prior to visit.     Review of Systems:   Constitutional: No weight loss or gain, night sweats, fevers, chills, fatigue, or lassitude. HEENT: No headaches, difficulty swallowing, tooth/dental problems, or sore throat. No sneezing, itching, ear ache, nasal congestion, or post nasal drip CV:  No chest pain, orthopnea, PND, swelling in lower extremities, anasarca, dizziness, palpitations, syncope Resp: No shortness of breath with exertion or at rest. No excess mucus or change in color of mucus. No productive or non-productive. No hemoptysis. No  wheezing.  No chest wall deformity GI:  No heartburn, indigestion, abdominal pain, nausea, vomiting, diarrhea, change in bowel habits, loss of appetite, bloody stools.  GU: No dysuria, change in color of urine, urgency or frequency.  No flank pain, no hematuria  Skin: No rash, lesions, ulcerations MSK:  No joint pain or swelling.  No decreased range of motion.  No back pain. Neuro: No dizziness or lightheadedness.  Psych: No depression or anxiety. Mood stable.     Physical Exam:  There were no vitals taken for this visit.  GEN: Pleasant, interactive, well-nourished/chronically-ill appearing/acutely-ill appearing/poorly-nourished/morbidly obese; in no acute distress.****** HEENT:  Normocephalic and atraumatic. EACs patent bilaterally. TM pearly gray with present light reflex bilaterally. PERRLA. Sclera white. Nasal turbinates pink, moist and patent bilaterally. No rhinorrhea present. Oropharynx pink and moist, without exudate or edema. No lesions, ulcerations, or  postnasal drip.  NECK:  Supple w/ fair ROM. No JVD present. Normal carotid impulses w/o bruits. Thyroid  symmetrical with no goiter or nodules palpated. No lymphadenopathy.   CV: RRR, no m/r/g, no peripheral edema. Pulses intact, +2 bilaterally. No cyanosis, pallor or clubbing. PULMONARY:  Unlabored, regular breathing. Clear bilaterally A&P w/o wheezes/rales/rhonchi. No accessory muscle use.  GI: BS present and normoactive. Soft, non-tender to palpation. No organomegaly or masses detected. No CVA tenderness. MSK: No erythema, warmth or tenderness. Cap refil <2 sec all extrem. No deformities or joint swelling noted.  Neuro: A/Ox3. No focal deficits noted.   Skin: Warm, no lesions or rashe Psych: Normal affect and behavior. Judgement and thought content appropriate.     Lab Results:  CBC    Component Value Date/Time   WBC 4.3 08/31/2021 0943   RBC 4.15 08/31/2021 0943   HGB 12.9 08/31/2021 0943   HGB 13.8 09/30/2019 1148    HCT 39.7 08/31/2021 0943   HCT 42.0 09/30/2019 1148   PLT 253 08/31/2021 0943   PLT 262 09/30/2019 1148   MCV 95.7 08/31/2021 0943   MCV 96 09/30/2019 1148   MCH 31.1 08/31/2021 0943   MCHC 32.5 08/31/2021 0943   RDW 13.6 08/31/2021 0943   RDW 12.8 09/30/2019 1148   LYMPHSABS 1,797 08/31/2021 0943   LYMPHSABS 2.1 09/30/2019 1148   MONOABS 0.4 08/21/2018 1458   EOSABS 112 08/31/2021 0943   EOSABS 0.2 09/30/2019 1148   BASOSABS 39 08/31/2021 0943   BASOSABS 0.0 09/30/2019 1148    BMET    Component Value Date/Time   NA 138 05/13/2023 0948   K 4.4 05/13/2023 0948   CL 102 05/13/2023 0948   CO2 22 05/13/2023 0948   GLUCOSE 83 05/13/2023 0948   GLUCOSE 87 10/27/2021 1510   BUN 16 05/13/2023 0948   CREATININE 0.72 05/13/2023 0948   CREATININE 0.90 10/27/2021 1510   CALCIUM  9.9 05/13/2023 0948   GFRNONAA >60 02/15/2021 2231   GFRAA >60 02/01/2020 1115    BNP No results found for: BNP   Imaging:  MR BRAIN W WO CONTRAST Result Date: 03/02/2024 CLINICAL DATA:  Gait instability.  Difficulty walking. EXAM: MRI HEAD WITHOUT AND WITH CONTRAST TECHNIQUE: Multiplanar, multiecho pulse sequences of the brain and surrounding structures were obtained without and with intravenous contrast. CONTRAST:  6 cc V[employer]way  COMPARISON:  None Available. FINDINGS: Brain: Diffusion imaging does not show any acute or subacute infarction or other cause of restricted diffusion. Mild chronic small-vessel ischemic change affects the pons. No focal cerebellar insult. Cerebral hemispheres show mild age related volume loss without subjective lobar predominance. There are mild chronic small-vessel ischemic changes of the white matter, including of the anterior limb internal capsule on the right. No cortical or large vessel territory stroke. No mass, hemorrhage, hydrocephalus or extra-axial collection. After contrast administration, no abnormal brain or leptomeningeal enhancement occurs. There are two or 3 punctate  foci of hemosiderin deposition incidentally noted in the left frontal lobe, not likely significant. Vascular: Major vessels at the base of the brain show flow. Skull and upper cervical spine: Negative Sinuses/Orbits: Paranasal sinuses are clear except for mild mucosal inflammation in the right maxillary and sphenoid sinus. Right maxillary sinus retention cyst. Orbits negative. Patient does have bilateral mastoid effusions, more extensive on the left than the right. In some cases, this can relate to dizziness. Other: None IMPRESSION: 1. No acute brain finding. Mild chronic small-vessel ischemic change of the pons and cerebral hemispheric white matter. No cortical  or large vessel territory stroke. 2. Bilateral mastoid effusions, more extensive on the left than the right. In some cases, this can relate to dizziness. Electronically Signed   By: Oneil Officer M.D.   On: 03/02/2024 12:00    Administration History     None          Latest Ref Rng & Units 08/21/2018   12:45 PM  PFT Results  FVC-Pre L 2.92  P  FVC-Predicted Pre % 158  P  FVC-Post L 2.84  P  FVC-Predicted Post % 154  P  Pre FEV1/FVC % % 74  P  Post FEV1/FCV % % 78  P  FEV1-Pre L 2.17  P  FEV1-Predicted Pre % 153  P  FEV1-Post L 2.21  P  DLCO uncorrected ml/min/mmHg 16.01  P  DLCO UNC% % 74  P  DLCO corrected ml/min/mmHg 16.97  P  DLCO COR %Predicted % 78  P  DLVA Predicted % 84  P  TLC L 4.97  P  TLC % Predicted % 104  P  RV % Predicted % 90  P    P Preliminary result    No results found for: NITRICOXIDE      Assessment & Plan:   No problem-specific Assessment & Plan notes found for this encounter.   Advised if symptoms do not improve or worsen, to please contact office for sooner follow up or seek emergency care.   I spent *** minutes of dedicated to the care of this patient on the date of this encounter to include pre-visit review of records, face-to-face time with the patient discussing conditions above, post  visit ordering of testing, clinical documentation with the electronic health record, making appropriate referrals as documented, and communicating necessary findings to members of the patients care team.  Comer LULLA Rouleau, NP 03/23/2024  Pt aware and understands NP's role.

## 2024-03-23 NOTE — Telephone Encounter (Signed)
 Duplicate

## 2024-03-24 ENCOUNTER — Ambulatory Visit (INDEPENDENT_AMBULATORY_CARE_PROVIDER_SITE_OTHER): Admitting: Nurse Practitioner

## 2024-03-24 ENCOUNTER — Encounter (HOSPITAL_BASED_OUTPATIENT_CLINIC_OR_DEPARTMENT_OTHER): Payer: Self-pay | Admitting: Nurse Practitioner

## 2024-03-24 ENCOUNTER — Ambulatory Visit (INDEPENDENT_AMBULATORY_CARE_PROVIDER_SITE_OTHER)

## 2024-03-24 VITALS — BP 138/82 | HR 77 | Ht 62.0 in | Wt 151.9 lb

## 2024-03-24 DIAGNOSIS — M05762 Rheumatoid arthritis with rheumatoid factor of left knee without organ or systems involvement: Secondary | ICD-10-CM

## 2024-03-24 DIAGNOSIS — R058 Other specified cough: Secondary | ICD-10-CM

## 2024-03-24 DIAGNOSIS — E041 Nontoxic single thyroid nodule: Secondary | ICD-10-CM | POA: Insufficient documentation

## 2024-03-24 DIAGNOSIS — Z87891 Personal history of nicotine dependence: Secondary | ICD-10-CM | POA: Diagnosis not present

## 2024-03-24 DIAGNOSIS — I7300 Raynaud's syndrome without gangrene: Secondary | ICD-10-CM | POA: Insufficient documentation

## 2024-03-24 DIAGNOSIS — J411 Mucopurulent chronic bronchitis: Secondary | ICD-10-CM

## 2024-03-24 DIAGNOSIS — R42 Dizziness and giddiness: Secondary | ICD-10-CM | POA: Diagnosis not present

## 2024-03-24 DIAGNOSIS — J929 Pleural plaque without asbestos: Secondary | ICD-10-CM | POA: Diagnosis not present

## 2024-03-24 DIAGNOSIS — J988 Other specified respiratory disorders: Secondary | ICD-10-CM | POA: Diagnosis not present

## 2024-03-24 DIAGNOSIS — Z981 Arthrodesis status: Secondary | ICD-10-CM | POA: Diagnosis not present

## 2024-03-24 DIAGNOSIS — J209 Acute bronchitis, unspecified: Secondary | ICD-10-CM

## 2024-03-24 DIAGNOSIS — A498 Other bacterial infections of unspecified site: Secondary | ICD-10-CM

## 2024-03-24 MED ORDER — BENZONATATE 200 MG PO CAPS: 200.0000 mg | ORAL_CAPSULE | Freq: Three times a day (TID) | ORAL | 1 refills | Status: DC | PRN

## 2024-03-24 MED ORDER — CIPROFLOXACIN HCL 500 MG PO TABS
500.0000 mg | ORAL_TABLET | Freq: Two times a day (BID) | ORAL | 0 refills | Status: AC
Start: 2024-03-24 — End: 2024-04-03

## 2024-03-24 MED ORDER — ALBUTEROL SULFATE (2.5 MG/3ML) 0.083% IN NEBU: 2.5000 mg | INHALATION_SOLUTION | Freq: Four times a day (QID) | RESPIRATORY_TRACT | 5 refills | Status: AC | PRN

## 2024-03-24 NOTE — Assessment & Plan Note (Addendum)
 Unrelated to acute worsening of cough. No significant findings by ENT. Continue vestibular therapy. Follow up with PCP

## 2024-03-24 NOTE — Assessment & Plan Note (Signed)
 On immunosuppression therapy. Follow up with rheumatology as scheduled

## 2024-03-24 NOTE — Progress Notes (Signed)
 This encounter was created in error - please disregard.

## 2024-03-24 NOTE — Assessment & Plan Note (Signed)
Follow-up with rheumatology as scheduled. 

## 2024-03-24 NOTE — Assessment & Plan Note (Addendum)
 Thyroid  nodule vs lymph node on exam today to left neck. Recommend she have thyroid  US  - ordered today and advised to follow up with PCP

## 2024-03-24 NOTE — Patient Instructions (Addendum)
 Continue Albuterol  inhaler 2 puffs or 3 mL every 6 hours as needed for shortness of breath or wheezing. Notify if symptoms persist despite rescue inhaler/neb use. Use nebs 2-3 times a day followed by flutter valve 10 times until symptoms improve  Continue Stiolto 2 puffs daily   Ciprofloxacin  500 mg Twice daily for 10 days. Take with food. Notify if you develop any tendon pain or if you start having watery diarrhea  Guaifenesin  600 mg Twice daily for cough/congestion  Benzonatate  1 capsule Three times a day for cough   Sputum cultures today Chest x ray today  Call your primary care provider about having your thyroid  evaluated Thyroid  ultrasound ordered  Follow up in 2-3 weeks with Dr. Kassie or Joy Bethel Gaglio,NP. If symptoms do not improve or worsen, please contact office for sooner follow up or seek emergency care.

## 2024-03-24 NOTE — Assessment & Plan Note (Addendum)
 With acute exacerbation. Hx of pseudomonal infection. Given failure to improve, will treat her with empiric cipro  course x 10 days. CXR today to assess for superimposed infection. Mucociliary clearance therapies advised. Sputum culture and AFB ordered. Continue bronchodilator regimen. Close follow up. Action plan in place  Patient Instructions  Continue Albuterol  inhaler 2 puffs or 3 mL every 6 hours as needed for shortness of breath or wheezing. Notify if symptoms persist despite rescue inhaler/neb use. Use nebs 2-3 times a day followed by flutter valve 10 times until symptoms improve  Continue Stiolto 2 puffs daily   Ciprofloxacin  500 mg Twice daily for 10 days. Take with food. Notify if you develop any tendon pain or if you start having watery diarrhea  Guaifenesin  600 mg Twice daily for cough/congestion  Benzonatate  1 capsule Three times a day for cough   Sputum cultures today Chest x ray today  Call your primary care provider about having your thyroid  evaluated Thyroid  ultrasound ordered  Follow up in 2-3 weeks with Dr. Kassie or Katie Kijuan Gallicchio,NP. If symptoms do not improve or worsen, please contact office for sooner follow up or seek emergency care.

## 2024-03-24 NOTE — Assessment & Plan Note (Addendum)
 Off tobra since 2023. See above. Refer to ID if she has recurrence

## 2024-03-26 ENCOUNTER — Other Ambulatory Visit

## 2024-03-26 ENCOUNTER — Ambulatory Visit: Payer: Self-pay | Admitting: Nurse Practitioner

## 2024-03-26 ENCOUNTER — Other Ambulatory Visit: Payer: Self-pay | Admitting: Nurse Practitioner

## 2024-03-26 DIAGNOSIS — J209 Acute bronchitis, unspecified: Secondary | ICD-10-CM | POA: Diagnosis not present

## 2024-03-26 NOTE — Progress Notes (Signed)
 CXR clear with some slight scarring at the right lung base

## 2024-03-27 ENCOUNTER — Ambulatory Visit: Payer: Self-pay | Admitting: Nurse Practitioner

## 2024-03-27 ENCOUNTER — Telehealth: Payer: Self-pay | Admitting: Nurse Practitioner

## 2024-03-27 NOTE — Progress Notes (Signed)
 I have no idea what this is. Can you check with lab?

## 2024-03-27 NOTE — Progress Notes (Signed)
 Spoke with Carolitta at the [facility] they needed the account number testing is already in progress

## 2024-03-27 NOTE — Telephone Encounter (Signed)
 Zott, Glade Edison, Joy [name]; Gamble, Tammy; Zott, Neal; Darrel Boyer [name], Insurance denied the [employer] order, she received a Nebulizer 10/03/2021 and is not eligible for one thru insurance until 10/03/2026. We have canceled the order and I left a message to inform the patient and to call us  back if she wants to private pay we can put the order back on

## 2024-04-03 NOTE — Progress Notes (Signed)
 So far cultures are negative but sputum was inadequate. Will wait on AFB to finalize. May need to recollect sputum if she continues to have productive cough

## 2024-04-06 NOTE — Progress Notes (Signed)
 Pt.notified

## 2024-04-07 DIAGNOSIS — D509 Iron deficiency anemia, unspecified: Secondary | ICD-10-CM | POA: Diagnosis not present

## 2024-04-07 DIAGNOSIS — R899 Unspecified abnormal finding in specimens from other organs, systems and tissues: Secondary | ICD-10-CM | POA: Diagnosis not present

## 2024-04-08 DIAGNOSIS — M48062 Spinal stenosis, lumbar region with neurogenic claudication: Secondary | ICD-10-CM | POA: Diagnosis not present

## 2024-04-13 ENCOUNTER — Other Ambulatory Visit: Payer: Self-pay | Admitting: Family Medicine

## 2024-04-13 ENCOUNTER — Ambulatory Visit
Admission: RE | Admit: 2024-04-13 | Discharge: 2024-04-13 | Disposition: A | Discharge status: Home / Self Care | Source: Ambulatory Visit | Attending: Family Medicine | Admitting: Family Medicine

## 2024-04-13 ENCOUNTER — Ambulatory Visit

## 2024-04-13 DIAGNOSIS — N644 Mastodynia: Secondary | ICD-10-CM

## 2024-04-13 DIAGNOSIS — R928 Other abnormal and inconclusive findings on diagnostic imaging of breast: Secondary | ICD-10-CM | POA: Diagnosis not present

## 2024-04-14 DIAGNOSIS — M0589 Other rheumatoid arthritis with rheumatoid factor of multiple sites: Secondary | ICD-10-CM | POA: Diagnosis not present

## 2024-04-15 ENCOUNTER — Telehealth (HOSPITAL_BASED_OUTPATIENT_CLINIC_OR_DEPARTMENT_OTHER): Payer: Self-pay | Admitting: *Deleted

## 2024-04-15 ENCOUNTER — Encounter (HOSPITAL_BASED_OUTPATIENT_CLINIC_OR_DEPARTMENT_OTHER): Payer: Self-pay | Admitting: Nurse Practitioner

## 2024-04-15 ENCOUNTER — Ambulatory Visit (HOSPITAL_BASED_OUTPATIENT_CLINIC_OR_DEPARTMENT_OTHER): Admitting: Nurse Practitioner

## 2024-04-15 VITALS — BP 126/77 | HR 76 | Ht 62.0 in | Wt 152.0 lb

## 2024-04-15 DIAGNOSIS — J988 Other specified respiratory disorders: Secondary | ICD-10-CM | POA: Diagnosis not present

## 2024-04-15 DIAGNOSIS — J310 Chronic rhinitis: Secondary | ICD-10-CM | POA: Insufficient documentation

## 2024-04-15 DIAGNOSIS — J411 Mucopurulent chronic bronchitis: Secondary | ICD-10-CM | POA: Diagnosis not present

## 2024-04-15 DIAGNOSIS — E041 Nontoxic single thyroid nodule: Secondary | ICD-10-CM | POA: Diagnosis not present

## 2024-04-15 DIAGNOSIS — M05762 Rheumatoid arthritis with rheumatoid factor of left knee without organ or systems involvement: Secondary | ICD-10-CM | POA: Diagnosis not present

## 2024-04-15 DIAGNOSIS — B965 Pseudomonas (aeruginosa) (mallei) (pseudomallei) as the cause of diseases classified elsewhere: Secondary | ICD-10-CM

## 2024-04-15 MED ORDER — FLUTICASONE PROPIONATE 50 MCG/ACT NA SUSP: 2.0000 | Freq: Every day | NASAL | 2 refills | Status: AC

## 2024-04-15 NOTE — Assessment & Plan Note (Signed)
 On immunosuppression therapy. Follow up with rheumatology as scheduled

## 2024-04-15 NOTE — Progress Notes (Signed)
 @Patient  ID: Joy [name], female    DOB: 1941-06-03, 83 y.o.   MRN: [id number]  Chief Complaint  Patient presents with   Follow-up    Referring provider: Katina Pfeiffer, PA-C  HPI: 83 year old female, former smoker followed for chronic bronchitis and pseudomonas colonization. She is a patient of Dr. Laymond and last seen in office 03/24/2024 by [facility] NP. Past medical history significant for HTN, RA.   TEST/EVENTS:  08/21/2018 PFT: FVC 158, FEV1 153, ratio 78, TLC 104, DLCO 78 12/18/2022 CT chest: atherosclerosis. Mild cardiomegaly. Moderate-sized hiatal hernia. Airway thickening present, suggesting bronchitis or reactive airways disease. Prior lung nodules resolved. Stable 7x3x3 mm, RLL nodule. Not substantially changed over the last year. New 5 mm ground glass density right apical pulmonary nodule. No f/u recommended. Atherosclerosis. New subacute healing left anterior third rib fx. 03/24/2024 CXR: clear lungs   07/31/2023: OV with Dr. Kassie. Poor appetite and weight loss; had stenosis on prior EGD and dilated 03/2023. Reports cough, minimal sputum but worsening nasal congestion. Still going to church and social activities. On LAMA/LABA with good control. Completed tobramycin  in 2023 for pseudomonas. May need to refer to ID if persistent. Start flonase . Has waxing and waning nodules; suspect all benign. Would favor surveillance.   03/23/2024: Joy [name] with Joy Clarin NP History of Present Illness Joy [name] is an 83 year old female with persistent cough and congestion.  She has been experiencing a persistent cough and congestion for the past few weeks. The cough produces sputum that varies in color from green to beige to white. She visited an ear, nose, and throat specialist who found no infection or abnormalities in her ears or throat. Approximately two weeks ago, she visited urgent care and was prescribed prednisone , a cold medicine, and another unspecified large pill, but no antibiotics. The  prednisone  provided temporary relief, but symptoms returned after discontinuation. She has a history of pseudomonas colonization and was on tobra nebs, completed in 2023.  She experiences dizziness and balance problems, for which she is doing physical therapy. She reports occasional sharp ear pain but no hearing issues. She is undergoing vestibular therapy to address dizziness and gait issues. She has a history of rheumatoid arthritis and receives infliximab  infusions every six weeks. She reports dry eyes and dry mouth, and experiences Raynaud's phenomenon, with her hands turning purple and white in cold conditions. She also has joint pain in her knees and sciatica in her left leg. She is awaiting a knee revision for her right leg, which has previously undergone replacement. She follows with rheumatology.  No fever, hemoptysis, or significant shortness of breath; she occasionally experiences chills and her hands feel cold. She does not wheeze. She uses over-the-counter cough medicine as needed, particularly at night when symptoms worsen.  04/15/2024: Today - follow up Discussed the use of AI scribe software for clinical note transcription with the patient, who gave verbal consent to proceed  Joy [name] is an 83 year old female who presents for follow up following chronic bronchitis flare.   She was treated with ciprofloxacin . She has ongoing nasal drainage from her nose but overall, feeling much better. The cough has improved but is not completely resolved and is not as deep as it was. The sputum has transitioned from yellow to a much lighter shade as of this morning. Feels like she's back to her baseline.   She has a history of Pseudomonas colonization.   She has not had her thyroid  ultrasound yet.  No one has contacted her to schedule this.  She's not using any nasal sprays or rinses. No fevers, chills, hemoptysis, sore throat, ear pain, chest congestion. Breathing is stable. No wheezing. Using  her inhalers as prescribed, nebulizers and flutter valve. Taking mucinex  Twice daily.     Allergies  Allergen Reactions   Codeine Other (See Comments)    Lump in throat   Tape Hives and Rash    Paper tape only   Latex Rash   Nickel Rash    Bumps Also other metals    Immunization History  Administered Date(s) Administered   Fluad Quad(high Dose 65+) 04/21/2019, 05/20/2021   INFLUENZA, HIGH DOSE SEASONAL PF 05/20/2018, 05/24/2020   Influenza Split 05/22/2012, 04/16/2014   Influenza-Unspecified 04/26/2015, 06/16/2019   PFIZER(Purple Top)SARS-COV-2 Vaccination 09/11/2019, 09/25/2019, 05/14/2020, 11/25/2020   Pfizer Covid-19 Vaccine Bivalent Booster 5yrs & up 05/08/2021   Pneumococcal Conjugate-13 04/16/2014   Pneumococcal Polysaccharide-23 04/26/2015   Tdap 10/19/2014    Past Medical History:  Diagnosis Date   Ankylosing spondylitis (HCC)    Anxiety    Arthritis    RHEUMATOID   Back pain    Bronchitis    Constipation    COPD (chronic obstructive pulmonary disease) (HCC)    CXR 01/11/18 showed mild COPD and chronic bronchitis   CTS (carpal tunnel syndrome)    Dry eye    Dry mouth    Dysrhythmia    irregularity unknown at this time - being evaluated by cardiology   Essential hypertension 11/24/2015   GERD (gastroesophageal reflux disease)    HLA B27 (HLA B27 positive)    Hypercholesteremia    Hyperlipidemia 11/24/2015   Hypertension    IBS (irritable bowel syndrome)    Joint pain    Neuropathy    OAB (overactive bladder)    Obesity    Osteoarthritis    Osteoporosis    Pre-diabetes    Rheumatoid arthritis (HCC) 11/24/2015   Sciatica    Seasonal allergies    Spinal stenosis     Tobacco History: Social History   Tobacco Use  Smoking Status Former   Current packs/day: 0.00   Average packs/day: 1 pack/day for 35.0 years (35.0 ttl pk-yrs)   Types: Cigarettes   Start date: 05/28/1960   Quit date: 05/29/1995   Years since quitting: 28.9  Smokeless Tobacco Never    Counseling given: Not Answered   Outpatient Medications Prior to Visit  Medication Sig Dispense Refill   albuterol  (PROVENTIL ) (2.5 MG/3ML) 0.083% nebulizer solution Take 3 mLs (2.5 mg total) by nebulization every 6 (six) hours as needed for wheezing or shortness of breath. 75 mL 5   albuterol  (VENTOLIN  HFA) 108 (90 Base) MCG/ACT inhaler INHALE 2 PUFFS INTO THE LUNGS EVERY 4 HOURS AS NEEDED FOR WHEEZING OR SHORTNESS OF BREATH 6.7 g 0   amLODipine  (NORVASC ) 2.5 MG tablet TAKE 1 TABLET(2.5 MG) BY MOUTH DAILY 90 tablet 2   Apoaequorin (PREVAGEN PO) Take by mouth.     benzonatate  (TESSALON ) 200 MG capsule Take 1 capsule (200 mg total) by mouth 3 (three) times daily as needed for cough. 30 capsule 1   Cholecalciferol  (VITAMIN D ) 50 MCG (2000 UT) tablet Take 2,000 Units by mouth daily.     diphenhydrAMINE  (BENADRYL ) 25 MG tablet Take 25 mg by mouth daily as needed for allergies.     gabapentin  (NEURONTIN ) 300 MG capsule Take 300-600 mg by mouth See admin instructions. Take 300 mg in the morning, 300 mg in the afternoon, and 600  mg at night  1   irbesartan  (AVAPRO ) 300 MG tablet Take 1 tablet (300 mg total) by mouth daily. 30 tablet 0   Magnesium  250 MG TABS Take 1 tablet (250 mg total) by mouth daily. 30 tablet 0   Menthol , Topical Analgesic, (ICY HOT EX) Apply 1 Application topically daily as needed (pain).     naproxen sodium (ALEVE) 220 MG tablet Take 440 mg by mouth daily as needed (pain).     polyethylene glycol (MIRALAX  / GLYCOLAX ) 17 g packet Take 17 g by mouth daily as needed for moderate constipation.     Propylene Glycol (SYSTANE BALANCE) 0.6 % SOLN Place 1 drop into both eyes 2 (two) times daily as needed (dry eyes).     rosuvastatin  (CRESTOR ) 40 MG tablet TAKE 1 TABLET(40 MG) BY MOUTH DAILY 90 tablet 0   sertraline  (ZOLOFT ) 25 MG tablet Take 50 mg by mouth daily.      Tiotropium Bromide -Olodaterol (STIOLTO RESPIMAT ) 2.5-2.5 MCG/ACT AERS Inhale 2 puffs into the lungs daily.      Tiotropium Bromide -Olodaterol (STIOLTO RESPIMAT ) 2.5-2.5 MCG/ACT AERS Inhale 2 puffs into the lungs daily. 4 g 11   Varenicline Tartrate (TYRVAYA) 0.03 MG/ACT SOLN Place 1 spray into the nose daily.     vitamin B-12 (CYANOCOBALAMIN ) 100 MCG tablet Take 100 mcg by mouth daily.     No facility-administered medications prior to visit.     Review of Systems:   Constitutional: No weight loss or gain, night sweats, fevers,or lassitude. +fatigue (baseline), chills (baseline) HEENT: No headaches, tooth/dental problems, or sore throat. No sneezing, itching, ear ache +nasal congestion, difficulty swallowing (baseline), dry mouth  CV:  No chest pain, orthopnea, PND, swelling in lower extremities, anasarca, palpitations, syncope Resp: +productive cough (improved). No shortness of breath with exertion or at rest. No hemoptysis. No wheezing.  No chest wall deformity GI:  No heartburn, indigestion, loss of appetite Skin: No rash, lesions, ulcerations +discoloration to fingertips  MSK:  +chronic joint pains  Neuro: +dizziness, unsteadiness (baseline). No memory impairment  Psych: No depression or anxiety. Mood stable.     Physical Exam:  BP 126/77 (BP Location: Left Arm, Patient Position: Sitting)   Pulse 76   Ht 5' 2 (1.575 m)   Wt 152 lb (68.9 kg)   SpO2 98%   BMI 27.80 kg/m   GEN: Pleasant, interactive, well-appearing; in no acute distress HEENT:  Normocephalic and atraumatic. PERRLA. Sclera white. Nasal turbinates erythematous and patent bilaterally. White rhinorrhea present. Oropharynx pink and moist, without exudate or edema. No lesions, ulcerations NECK:  Supple w/ fair ROM. Thyroid  symmetrical. Possible nodule left lobe?  CV: RRR, no m/r/g, no peripheral edema. Pulses intact, +2 bilaterally. No cyanosis, pallor or clubbing. PULMONARY:  Unlabored, regular breathing. Clear bilaterally A&P w/o wheezes/rales/rhonchi.  GI: BS present and normoactive. Soft, non-tender to palpation. No  organomegaly or masses detected.  MSK: No erythema, warmth or tenderness. Cap refil >2 sec all extrem. Swan necked deformities  Neuro: A/Ox3. No focal deficits noted.   Skin: Warm, no lesions or rashe Psych: Normal affect and behavior. Judgement and thought content appropriate.     Lab Results:  CBC    Component Value Date/Time   WBC 4.3 08/31/2021 0943   RBC 4.15 08/31/2021 0943   HGB 12.9 08/31/2021 0943   HGB 13.8 09/30/2019 1148   HCT 39.7 08/31/2021 0943   HCT 42.0 09/30/2019 1148   PLT 253 08/31/2021 0943   PLT 262 09/30/2019 1148   MCV 95.7  08/31/2021 0943   MCV 96 09/30/2019 1148   MCH 31.1 08/31/2021 0943   MCHC 32.5 08/31/2021 0943   RDW 13.6 08/31/2021 0943   RDW 12.8 09/30/2019 1148   LYMPHSABS 1,797 08/31/2021 0943   LYMPHSABS 2.1 09/30/2019 1148   MONOABS 0.4 08/21/2018 1458   EOSABS 112 08/31/2021 0943   EOSABS 0.2 09/30/2019 1148   BASOSABS 39 08/31/2021 0943   BASOSABS 0.0 09/30/2019 1148    BMET    Component Value Date/Time   NA 138 05/13/2023 0948   K 4.4 05/13/2023 0948   CL 102 05/13/2023 0948   CO2 22 05/13/2023 0948   GLUCOSE 83 05/13/2023 0948   GLUCOSE 87 10/27/2021 1510   BUN 16 05/13/2023 0948   CREATININE 0.72 05/13/2023 0948   CREATININE 0.90 10/27/2021 1510   CALCIUM  9.9 05/13/2023 0948   GFRNONAA >60 02/15/2021 2231   GFRAA >60 02/01/2020 1115    BNP No results found for: BNP   Imaging:  MM 3D DIAGNOSTIC MAMMOGRAM UNILATERAL LEFT BREAST Result Date: 04/13/2024 CLINICAL DATA:  83 year old woman with diffuse LEFT breast pain for approximately 1 month. Prior history of BILATERAL breast reduction. EXAM: DIGITAL DIAGNOSTIC UNILATERAL LEFT MAMMOGRAM WITH TOMOSYNTHESIS AND CAD TECHNIQUE: Left digital diagnostic mammography and breast tomosynthesis was performed. The images were evaluated with computer-aided detection. COMPARISON:  Previous exam(s). ACR Breast Density Category a: The breasts are almost entirely fatty. FINDINGS:  LEFT: Mammogram: No suspicious mass, distortion, or microcalcifications are identified to suggest presence of malignancy. IMPRESSION: No evidence of LEFT breast malignancy. RECOMMENDATION: 1. BILATERAL screening mammogram June 2026. 2. Any further workup of the patient's symptoms should be based on the clinical assessment. I have discussed the findings and recommendations with the patient. If applicable, a reminder letter will be sent to the patient regarding the next appointment. BI-RADS CATEGORY  1: Negative. Electronically Signed   By: Aliene Lloyd M.D.   On: 04/13/2024 12:08   DG Chest 2 View Result Date: 03/24/2024 CLINICAL DATA:  Productive cough. EXAM: CHEST - 2 VIEW COMPARISON:  08/31/2021 FINDINGS: The heart size and mediastinal contours are within normal limits. Both lungs are clear. Mild right basilar pleural thickening noted. Fusion hardware noted in the lower thoracic and lumbar spine. IMPRESSION: No active cardiopulmonary disease. Electronically Signed   By: Norleen DELENA Kil M.D.   On: 03/24/2024 10:13    Administration History     None          Latest Ref Rng & Units 08/21/2018   12:45 PM  PFT Results  FVC-Pre L 2.92  P  FVC-Predicted Pre % 158  P  FVC-Post L 2.84  P  FVC-Predicted Post % 154  P  Pre FEV1/FVC % % 74  P  Post FEV1/FCV % % 78  P  FEV1-Pre L 2.17  P  FEV1-Predicted Pre % 153  P  FEV1-Post L 2.21  P  DLCO uncorrected ml/min/mmHg 16.01  P  DLCO UNC% % 74  P  DLCO corrected ml/min/mmHg 16.97  P  DLCO COR %Predicted % 78  P  DLVA Predicted % 84  P  TLC L 4.97  P  TLC % Predicted % 104  P  RV % Predicted % 90  P    P Preliminary result    No results found for: NITRICOXIDE      Assessment & Plan:   Mucopurulent chronic bronchitis (HCC) Recent exacerbation treated with empiric ciprofloxacin  x 10 days. CXR 8/5 clear. Clinically improved. Hx of pseudomonal infection.  Mucociliary clearance therapies advised. Sputum culture inadequate. AFB pending but  negative to date. Given improvement and clear imaging, will hold off on recollecting sputum culture. Continue bronchodilator regimen. Action plan in place  Patient Instructions  Continue Albuterol  inhaler 2 puffs or 3 mL every 6 hours as needed for shortness of breath or wheezing. Notify if symptoms persist despite rescue inhaler/neb use. Use nebs 2-3 times a day followed by flutter valve 10 times until symptoms improve  Continue Stiolto 2 puffs daily  Continue Guaifenesin  600 mg Twice daily for cough/congestion  Continue benzonatate  1 capsule Three times a day as needed for cough  Saline nasal rinses 1-2 times a day. Use bottled, distilled water . [name] or Neti pot or can use store brand version. Follow 20-30 minutes later with flonase  2 sprays each nostril daily   Attend thyroid  ultrasound then see PCP afterwards     Follow up in 3 months with Dr. Kassie or Katie Fines Kimberlin,NP. If symptoms do not improve or worsen, please contact office for sooner follow up or seek emergency care.    Pseudomonas respiratory infection See above   Chronic rhinitis Chronic rhinitis, exacerbating cough due to postnasal drainage and upper airway irritation. Add intranasal steroid and saline rinses. Continue follow up with ENT  Thyroid  nodule Potential left thyroid  nodule on exam vs lymph node. Awaiting thyroid  US . Will follow up with PCCs on scheduling   Rheumatoid arthritis (HCC) On immunosuppression therapy. Follow up with rheumatology as scheduled    Advised if symptoms do not improve or worsen, to please contact office for sooner follow up or seek emergency care.   I spent 35 minutes of dedicated to the care of this patient on the date of this encounter to include pre-visit review of records, face-to-face time with the patient discussing conditions above, post visit ordering of testing, clinical documentation with the electronic health record, making appropriate referrals as documented, and  communicating necessary findings to members of the patients care team.  Comer LULLA Rouleau, NP 04/15/2024  Pt aware and understands NP's role.

## 2024-04-15 NOTE — Assessment & Plan Note (Signed)
 Chronic rhinitis, exacerbating cough due to postnasal drainage and upper airway irritation. Add intranasal steroid and saline rinses. Continue follow up with ENT

## 2024-04-15 NOTE — Patient Instructions (Addendum)
 Continue Albuterol  inhaler 2 puffs or 3 mL every 6 hours as needed for shortness of breath or wheezing. Notify if symptoms persist despite rescue inhaler/neb use. Use nebs 2-3 times a day followed by flutter valve 10 times until symptoms improve  Continue Stiolto 2 puffs daily  Continue Guaifenesin  600 mg Twice daily for cough/congestion  Continue benzonatate  1 capsule Three times a day as needed for cough  Saline nasal rinses 1-2 times a day. Use bottled, distilled water . [name] or Neti pot or can use store brand version. Follow 20-30 minutes later with flonase  2 sprays each nostril daily   Attend thyroid  ultrasound then see PCP afterwards     Follow up in 3 months with Dr. Kassie or Joy Keilani Terrance,Joy [name]. If symptoms do not improve or worsen, please contact office for sooner follow up or seek emergency care.

## 2024-04-15 NOTE — Assessment & Plan Note (Signed)
 Recent exacerbation treated with empiric ciprofloxacin  x 10 days. CXR 8/5 clear. Clinically improved. Hx of pseudomonal infection. Mucociliary clearance therapies advised. Sputum culture inadequate. AFB pending but negative to date. Given improvement and clear imaging, will hold off on recollecting sputum culture. Continue bronchodilator regimen. Action plan in place  Patient Instructions  Continue Albuterol  inhaler 2 puffs or 3 mL every 6 hours as needed for shortness of breath or wheezing. Notify if symptoms persist despite rescue inhaler/neb use. Use nebs 2-3 times a day followed by flutter valve 10 times until symptoms improve  Continue Stiolto 2 puffs daily  Continue Guaifenesin  600 mg Twice daily for cough/congestion  Continue benzonatate  1 capsule Three times a day as needed for cough  Saline nasal rinses 1-2 times a day. Use bottled, distilled water . [name] or Neti pot or can use store brand version. Follow 20-30 minutes later with flonase  2 sprays each nostril daily   Attend thyroid  ultrasound then see PCP afterwards     Follow up in 3 months with Dr. Kassie or Joy Keilany Burnette,NP. If symptoms do not improve or worsen, please contact office for sooner follow up or seek emergency care.

## 2024-04-15 NOTE — Assessment & Plan Note (Signed)
 See above.

## 2024-04-15 NOTE — Assessment & Plan Note (Signed)
 Potential left thyroid  nodule on exam vs lymph node. Awaiting thyroid  US . Will follow up with PCCs on scheduling

## 2024-04-17 NOTE — Telephone Encounter (Signed)
 Scheduled Ultrasound for Joy [name] for Wednesday 9/3 at [facility]. Called and left detailed voicemail regarding upcoming appointment. Printed and mailed reminder. Nothing further needed at this time.

## 2024-04-21 ENCOUNTER — Telehealth: Payer: Self-pay

## 2024-04-21 NOTE — Telephone Encounter (Signed)
   Pre-operative Risk Assessment    Patient Name: Joy [name]  DOB: 1941/04/23 MRN: [id number]   Date of last office visit: 05/13/23 [facility] [place], [place] Date of next office visit: NONE   Request for Surgical Clearance    Procedure:  RIGHT TOTAL KNEE REVISION  Date of Surgery:  Clearance 08/05/24                                Surgeon:  DR DEMPSEY MOAN Surgeon's Group or Practice Name:  [facility] Phone number:  [phone] Fax number:  [phone]  ATTN: KERRI MAZE   Type of Clearance Requested:   - Medical    Type of Anesthesia:  CHOICE   Additional requests/questions:    SignedLucie DELENA Ku   04/21/2024, 4:08 PM

## 2024-04-21 NOTE — Telephone Encounter (Signed)
 Left message for pt to call our office to schedule IN OFFICE Preop appt.

## 2024-04-21 NOTE — Telephone Encounter (Signed)
    Primary Cardiologist:Tiffany Raford, MD  Chart reviewed as part of pre-operative protocol coverage. Because of Joy [name]'s past medical history and time since last visit, he/she will require a follow-up visit in order to better assess preoperative cardiovascular risk.  Pre-op covering staff: - Please schedule in office appointment and call patient to inform them. - Please contact requesting surgeon's office via preferred method (i.e, phone, fax) to inform them of need for appointment prior to surgery.  If applicable, this message will also be routed to pharmacy pool and/or primary cardiologist for input on holding anticoagulant/antiplatelet agent as requested below so that this information is available at time of patient's appointment.   Joy CHRISTELLA Beauvais, NP  04/21/2024, 4:17 PM

## 2024-04-22 ENCOUNTER — Ambulatory Visit (HOSPITAL_BASED_OUTPATIENT_CLINIC_OR_DEPARTMENT_OTHER)
Admission: RE | Admit: 2024-04-22 | Discharge: 2024-04-22 | Disposition: A | Discharge status: Home / Self Care | Source: Ambulatory Visit | Attending: Nurse Practitioner | Admitting: Nurse Practitioner

## 2024-04-22 DIAGNOSIS — E041 Nontoxic single thyroid nodule: Secondary | ICD-10-CM | POA: Diagnosis not present

## 2024-04-22 DIAGNOSIS — E042 Nontoxic multinodular goiter: Secondary | ICD-10-CM | POA: Diagnosis not present

## 2024-04-22 NOTE — Telephone Encounter (Signed)
 2nd attempt to reach the pt to schedule in office appt.

## 2024-04-24 NOTE — Telephone Encounter (Signed)
 3rd attempt. Left message for pt to call our office and schedule IN OFFICE Preop appt.  Will update surgeons office that our office has been unable to reach pt to schedule  IN OFFICE preop appt  Pt can call [phone] to schedule that appt

## 2024-04-24 NOTE — Telephone Encounter (Signed)
 Pt is scheduled for IN OFFICE preop appt 06/22/24 with Reche Finder, NP  Will update the surgeons office.

## 2024-04-24 NOTE — Telephone Encounter (Signed)
 FYI Pt scheduled pre-op appt with C Walker for 06/22/24 @ 1:55

## 2024-04-27 ENCOUNTER — Other Ambulatory Visit: Payer: Self-pay | Admitting: Cardiovascular Disease

## 2024-04-27 ENCOUNTER — Other Ambulatory Visit (HOSPITAL_BASED_OUTPATIENT_CLINIC_OR_DEPARTMENT_OTHER): Payer: Self-pay | Admitting: Nurse Practitioner

## 2024-04-27 DIAGNOSIS — I10 Essential (primary) hypertension: Secondary | ICD-10-CM

## 2024-04-27 DIAGNOSIS — J209 Acute bronchitis, unspecified: Secondary | ICD-10-CM

## 2024-04-29 ENCOUNTER — Other Ambulatory Visit (HOSPITAL_BASED_OUTPATIENT_CLINIC_OR_DEPARTMENT_OTHER): Payer: Self-pay | Admitting: Nurse Practitioner

## 2024-04-29 DIAGNOSIS — J209 Acute bronchitis, unspecified: Secondary | ICD-10-CM

## 2024-04-29 NOTE — Progress Notes (Signed)
 Thyroid  ultrasound with multiple thyroid  nodules. There is a 1 cm nodule on her left thyroid , which is likely what I was feeling on exam. Recommendation is to repeat in one year. I am routing to her PCP. Pt will need to follow up with her PCP for future monitoring of this. Thanks!

## 2024-04-30 DIAGNOSIS — Z6828 Body mass index (BMI) 28.0-28.9, adult: Secondary | ICD-10-CM | POA: Diagnosis not present

## 2024-04-30 DIAGNOSIS — G629 Polyneuropathy, unspecified: Secondary | ICD-10-CM | POA: Diagnosis not present

## 2024-04-30 DIAGNOSIS — R519 Headache, unspecified: Secondary | ICD-10-CM | POA: Diagnosis not present

## 2024-04-30 DIAGNOSIS — D509 Iron deficiency anemia, unspecified: Secondary | ICD-10-CM | POA: Diagnosis not present

## 2024-04-30 DIAGNOSIS — R2681 Unsteadiness on feet: Secondary | ICD-10-CM | POA: Diagnosis not present

## 2024-04-30 DIAGNOSIS — M4800 Spinal stenosis, site unspecified: Secondary | ICD-10-CM | POA: Diagnosis not present

## 2024-04-30 DIAGNOSIS — R7989 Other specified abnormal findings of blood chemistry: Secondary | ICD-10-CM | POA: Diagnosis not present

## 2024-04-30 DIAGNOSIS — M069 Rheumatoid arthritis, unspecified: Secondary | ICD-10-CM | POA: Diagnosis not present

## 2024-05-01 DIAGNOSIS — Z23 Encounter for immunization: Secondary | ICD-10-CM | POA: Diagnosis not present

## 2024-05-07 DIAGNOSIS — Z1589 Genetic susceptibility to other disease: Secondary | ICD-10-CM | POA: Diagnosis not present

## 2024-05-07 DIAGNOSIS — M1991 Primary osteoarthritis, unspecified site: Secondary | ICD-10-CM | POA: Diagnosis not present

## 2024-05-07 DIAGNOSIS — M5432 Sciatica, left side: Secondary | ICD-10-CM | POA: Diagnosis not present

## 2024-05-07 DIAGNOSIS — Z6829 Body mass index (BMI) 29.0-29.9, adult: Secondary | ICD-10-CM | POA: Diagnosis not present

## 2024-05-07 DIAGNOSIS — E663 Overweight: Secondary | ICD-10-CM | POA: Diagnosis not present

## 2024-05-07 DIAGNOSIS — M0589 Other rheumatoid arthritis with rheumatoid factor of multiple sites: Secondary | ICD-10-CM | POA: Diagnosis not present

## 2024-05-07 DIAGNOSIS — Z79899 Other long term (current) drug therapy: Secondary | ICD-10-CM | POA: Diagnosis not present

## 2024-05-11 LAB — MYCOBACTERIA,CULT W/FLUOROCHROME SMEAR
MICRO NUMBER:: 16800703
SMEAR:: NONE SEEN
SPECIMEN QUALITY:: ADEQUATE

## 2024-05-11 LAB — RESPIRATORY CULTURE OR RESPIRATORY AND SPUTUM CULTURE: MICRO NUMBER:: 16800704

## 2024-05-11 LAB — HOUSE ACCOUNT TRACKING

## 2024-05-12 NOTE — Progress Notes (Signed)
Final cultures negative

## 2024-05-20 DIAGNOSIS — Z23 Encounter for immunization: Secondary | ICD-10-CM | POA: Diagnosis not present

## 2024-05-25 DIAGNOSIS — M810 Age-related osteoporosis without current pathological fracture: Secondary | ICD-10-CM | POA: Diagnosis not present

## 2024-05-26 DIAGNOSIS — M0589 Other rheumatoid arthritis with rheumatoid factor of multiple sites: Secondary | ICD-10-CM | POA: Diagnosis not present

## 2024-06-03 DIAGNOSIS — M25561 Pain in right knee: Secondary | ICD-10-CM | POA: Diagnosis not present

## 2024-06-03 DIAGNOSIS — G8929 Other chronic pain: Secondary | ICD-10-CM | POA: Diagnosis not present

## 2024-06-03 DIAGNOSIS — Z6830 Body mass index (BMI) 30.0-30.9, adult: Secondary | ICD-10-CM | POA: Diagnosis not present

## 2024-06-03 DIAGNOSIS — M79641 Pain in right hand: Secondary | ICD-10-CM | POA: Diagnosis not present

## 2024-06-03 DIAGNOSIS — M79642 Pain in left hand: Secondary | ICD-10-CM | POA: Diagnosis not present

## 2024-06-03 DIAGNOSIS — M48062 Spinal stenosis, lumbar region with neurogenic claudication: Secondary | ICD-10-CM | POA: Diagnosis not present

## 2024-06-22 ENCOUNTER — Ambulatory Visit (INDEPENDENT_AMBULATORY_CARE_PROVIDER_SITE_OTHER): Admitting: Family

## 2024-06-22 VITALS — BP 150/72 | HR 75 | Ht 62.0 in | Wt 160.6 lb

## 2024-06-22 DIAGNOSIS — I700 Atherosclerosis of aorta: Secondary | ICD-10-CM | POA: Diagnosis not present

## 2024-06-22 DIAGNOSIS — Z01810 Encounter for preprocedural cardiovascular examination: Secondary | ICD-10-CM | POA: Diagnosis not present

## 2024-06-22 DIAGNOSIS — I10 Essential (primary) hypertension: Secondary | ICD-10-CM | POA: Diagnosis not present

## 2024-06-22 DIAGNOSIS — E782 Mixed hyperlipidemia: Secondary | ICD-10-CM | POA: Diagnosis not present

## 2024-06-22 DIAGNOSIS — R7303 Prediabetes: Secondary | ICD-10-CM | POA: Diagnosis not present

## 2024-06-22 NOTE — Patient Instructions (Signed)
 Medication Instructions:  Be sure to take your blood pressure medications once per day.  If it helps, you can set an alarm on your phone.   *If you need a refill on your cardiac medications before your next appointment, please call your pharmacy*  Lab Work: Your physician recommends that you return for lab work today: direct LDL, A1c, CMET  If you have labs (blood work) drawn today and your tests are completely normal, you will receive your results only by: MyChart Message (if you have MyChart) OR A paper copy in the mail If you have any lab test that is abnormal or we need to change your treatment, we will call you to review the results.  Testing/Procedures: Your EKG today looked good!  Follow-Up: At [facility], you and your health needs are our priority.  As part of our continuing mission to provide you with exceptional heart care, our providers are all part of one team.  This team includes your primary Cardiologist (physician) and Advanced Practice Providers or APPs (Physician Assistants and Nurse Practitioners) who all work together to provide you with the care you need, when you need it.  Your next appointment:   6 month(s)  Provider:   Annabella Scarce, MD, Rosaline Bane, NP, or Reche Finder, NP    We recommend signing up for the patient portal called MyChart.  Sign up information is provided on this After Visit Summary.  MyChart is used to connect with patients for Virtual Visits (Telemedicine).  Patients are able to view lab/test results, encounter notes, upcoming appointments, etc.  Non-urgent messages can be sent to your provider as well.   To learn more about what you can do with MyChart, go to [email or web address].   Other Instructions  Tips to Measure your Blood Pressure Correctly  Please check blood pressure at least 3 times per week and keep a log.  If your blood pressure is consistently more than 130/80, please let cardiology team know.  We will  check in on blood pressure in one week via MyChart.   Here's what you can do to ensure a correct reading:  Don't drink a caffeinated beverage or smoke during the 30 minutes before the test.  Sit quietly for five minutes before the test begins.  During the measurement, sit in a chair with your feet on the floor and your arm supported so your elbow is at about heart level.  The inflatable part of the cuff should completely cover at least 80% of your upper arm, and the cuff should be placed on bare skin, not over a shirt.  Don't talk during the measurement.  Have your blood pressure measured twice, with a brief break in between. If the readings are different by 5 points or more, have it done a third time.  Blood pressure categories  Blood pressure category SYSTOLIC (upper number)  DIASTOLIC (lower number)  Normal Less than 120 mm Hg and Less than 80 mm Hg  Elevated 120-129 mm Hg and Less than 80 mm Hg  High blood pressure: Stage 1 hypertension 130-139 mm Hg or 80-89 mm Hg  High blood pressure: Stage 2 hypertension 140 mm Hg or higher or 90 mm Hg or higher  Hypertensive crisis (consult your doctor immediately) Higher than 180 mm Hg and/or Higher than 120 mm Hg  Source: American Heart Association and American Stroke Association. For more on getting your blood pressure under control, buy Controlling Your Blood Pressure, a Special Health Report from [facility].  Blood Pressure Log   Date   Time  Blood Pressure  Position  Example: Nov 1 9 AM 124/78 sitting

## 2024-06-22 NOTE — Progress Notes (Signed)
 Cardiology Office Note   Date:  06/27/2024  ID:  Joy [name], Joy [name] 06-01-1941, MRN [id number] PCP: Katina Pfeiffer, PA-C  [facility] Providers Cardiologist:  Annabella Scarce, MD     History of Present Illness Joy [name] is a 83 y.o. female with a hx of hypertension, hyperlipidemia, chronic bronchitis, aortic atherosclerosis, coronary calcification on CT, COPD, RA.   Established with cardiology 11/2015 for presurgical cardiovascular risk prior to knee surgery.  She had fallen and fractured her left tibial plateau on 09/02/2015.  EKG revealed nonspecific ST depression.  Nuclear stress testing 11/2015 LVEF 54%, no ischemia.  He reported palpitations were 24-hour monitor revealing PAC and PVC.   Seen for urgent visit 02/2021 for presyncope.  Echo 03/2021 LVEF 60 to 65%, normal diastolic function.  She had aortic sclerosis without stenosis.   At pharmacy visit 08/08/2021 amlodipine  was initiated.  January 2023 lipids elevated and had not tolerated atorvastatin  due to myalgias-she was started on rosuvastatin .  She saw Medford Bolk, [facility] 10/10/2021 with hypotension.  Amlodipine  decreased to 2.5 mg daily and irbesartan  300 mg daily continued.At visit 10/2022 Rosuvastatin  increased as lipids not well controlled.   She was last seen 05/13/23 by Dr. Scarce. She noted pending knee surgery in December. She also noted weight loss due to lack of appetite, fear of choking with unremarkable endoscopy. Her BP and lipid were well controlled. Her knee surgery was subsequently cancelled.   Presents today for follow-up. She is active at her church and has worked in dance movement psychotherapist.  Presents today for preoperative clearance for knee surgery scheduled 08/05/2024. She would like to get back to the [facility] for water  aerobics and chair yoga. Has been walking around [employer] for exercise in the meantime. No report of chest pain, shortness of breath, palpitations, edema. Notes dizziness and rhinorrhea with plans to  see ENT in next few weeks. She does not check BP regularly at home and admits to missing her medications some days. Recently completed bone density screening and Prolia was recommended. PCP monitors labs, last LDL 04/2023: 88.    ROS: Please see the history of present illness.    All other systems reviewed and are negative.   Studies Reviewed EKG Interpretation Date/Time:  Monday June 22 2024 13:44:44 EST Ventricular Rate:  75 PR Interval:  140 QRS Duration:  86 QT Interval:  380 QTC Calculation: 424 R Axis:   -16  Text Interpretation: Normal sinus rhythm Normal ECG Confirmed by Vannie Mora ([id number]) on 06/22/2024 2:21:09 PM    Cardiac Studies & Procedures   ______________________________________________________________________________________________   STRESS TESTS  MYOCARDIAL PERFUSION IMAGING 12/02/2015  Interpretation Summary  Normal Lexiscan  myoview  Nuclear stress EF: 54%.  There was no ST segment deviation noted during stress.  The study is normal.  This is a low risk study.   ECHOCARDIOGRAM  ECHOCARDIOGRAM COMPLETE 03/22/2021  Narrative ECHOCARDIOGRAM REPORT    Patient Name:   Joy [name] Date of Exam: 03/22/2021 Medical Rec #:  [id number]        Height:       62.0 in Accession #:    [id number]       Weight:       190.0 lb Date of Birth:  [birth year]        BSA:          1.870 m Patient Age:    80 years         BP:  138/70 mmHg Patient Gender: F                HR:           65 bpm. Exam Location:  [address]  Procedure: 2D Echo, Cardiac Doppler, Color Doppler and 3D Echo  Indications:    R55 Syncope  History:        Patient has no prior history of Echocardiogram examinations. COPD, Signs/Symptoms:Syncope; Risk Factors:Hypertension, Dyslipidemia and Obesity.  Sonographer:    Elsie Bohr RDCS Referring Phys: [id number] BRIAN S CRENSHAW  IMPRESSIONS   1. Left ventricular ejection fraction, by estimation, is 60 to 65%. The left  ventricle has normal function. The left ventricle has no regional wall motion abnormalities. There is mild left ventricular hypertrophy. Left ventricular diastolic parameters were normal. 2. Right ventricular systolic function is normal. The right ventricular size is normal. 3. Left atrial size was mildly dilated. 4. The mitral valve is abnormal. Trivial mitral valve regurgitation. No evidence of mitral stenosis. 5. The aortic valve is tricuspid. There is moderate calcification of the aortic valve. Aortic valve regurgitation is not visualized. Mild to moderate aortic valve sclerosis/calcification is present, without any evidence of aortic stenosis. 6. The inferior vena cava is normal in size with greater than 50% respiratory variability, suggesting right atrial pressure of 3 mmHg.  FINDINGS Left Ventricle: Left ventricular ejection fraction, by estimation, is 60 to 65%. The left ventricle has normal function. The left ventricle has no regional wall motion abnormalities. The left ventricular internal cavity size was normal in size. There is mild left ventricular hypertrophy. Left ventricular diastolic parameters were normal.  Right Ventricle: The right ventricular size is normal. No increase in right ventricular wall thickness. Right ventricular systolic function is normal.  Left Atrium: Left atrial size was mildly dilated.  Right Atrium: Right atrial size was normal in size.  Pericardium: There is no evidence of pericardial effusion.  Mitral Valve: The mitral valve is abnormal. There is mild thickening of the mitral valve leaflet(s). There is mild calcification of the mitral valve leaflet(s). Trivial mitral valve regurgitation. No evidence of mitral valve stenosis.  Tricuspid Valve: The tricuspid valve is normal in structure. Tricuspid valve regurgitation is mild . No evidence of tricuspid stenosis.  Aortic Valve: The aortic valve is tricuspid. There is moderate calcification of the aortic  valve. Aortic valve regurgitation is not visualized. Mild to moderate aortic valve sclerosis/calcification is present, without any evidence of aortic stenosis.  Pulmonic Valve: The pulmonic valve was normal in structure. Pulmonic valve regurgitation is mild. No evidence of pulmonic stenosis.  Aorta: The aortic root is normal in size and structure.  Venous: The inferior vena cava is normal in size with greater than 50% respiratory variability, suggesting right atrial pressure of 3 mmHg.  IAS/Shunts: No atrial level shunt detected by color flow Doppler.   LEFT VENTRICLE PLAX 2D LVIDd:         4.80 cm Diastology LVIDs:         3.30 cm LV e' medial:    6.74 cm/s LV PW:         1.30 cm LV E/e' medial:  11.0 LV IVS:        1.30 cm LV e' lateral:   7.18 cm/s LV E/e' lateral: 10.3  3D Volume EF: 3D EF:        56 % LV EDV:       156 ml LV ESV:       68 ml LV  SV:        88 ml  RIGHT VENTRICLE             IVC RV S prime:     13.80 cm/s  IVC diam: 1.20 cm TAPSE (M-mode): 2.0 cm RVSP:           25.7 mmHg  LEFT ATRIUM             Index       RIGHT ATRIUM           Index LA diam:        4.10 cm 2.19 cm/m  RA Pressure: 3.00 mmHg LA Vol (A2C):   80.2 ml 42.88 ml/m RA Area:     12.60 cm LA Vol (A4C):   52.3 ml 27.96 ml/m RA Volume:   31.70 ml  16.95 ml/m LA Biplane Vol: 65.6 ml 35.07 ml/m AORTIC VALVE LVOT Vmax:   80.30 cm/s LVOT Vmean:  49.900 cm/s LVOT VTI:    0.165 m  AORTA Ao Asc diam: 3.30 cm  MITRAL VALVE               TRICUSPID VALVE MV Area (PHT): 3.24 cm    TR Peak grad:   22.7 mmHg MV Decel Time: 234 msec    TR Vmax:        238.00 cm/s MV E velocity: 74.10 cm/s  Estimated RAP:  3.00 mmHg MV A velocity: 91.70 cm/s  RVSP:           25.7 mmHg MV E/A ratio:  0.81 SHUNTS Systemic VTI: 0.16 m  Maude Emmer MD Electronically signed by Maude Emmer MD Signature Date/Time: 03/22/2021/10:17:00 AM    Final           ______________________________________________________________________________________________      Risk Assessment/Calculations          Physical Exam VS:  BP (!) 150/72 (BP Location: Right Arm, Patient Position: Sitting, Cuff Size: Normal)   Pulse 75   Ht 5' 2 (1.575 m)   Wt 160 lb 9.6 oz (72.8 kg)   SpO2 99%   BMI 29.37 kg/m   Vitals:   06/22/24 1351 06/22/24 1400  BP: (!) 152/72 (!) 150/72  Pulse: 75   Height: 5' 2 (1.575 m)   Weight: 160 lb 9.6 oz (72.8 kg)   SpO2: 99%   BMI (Calculated): 29.37          Wt Readings from Last 3 Encounters:  06/22/24 160 lb 9.6 oz (72.8 kg)  04/15/24 152 lb (68.9 kg)  03/24/24 151 lb 14.4 oz (68.9 kg)    GEN: Well nourished, well developed in no acute distress NECK: No JVD; No carotid bruits CARDIAC: RRR, no murmurs, rubs, gallops; radial and PT pulses 2+ bilaterally  RESPIRATORY:  Clear to auscultation without rales, wheezing or rhonchi  ABDOMEN: Soft, non-tender, non-distended EXTREMITIES:  No edema; No deformity   ASSESSMENT AND PLAN  Preop - According to the Revised Cardiac Risk Index (RCRI), her Perioperative Risk of Major Cardiac Event is (%): 0.9.  Her Functional Capacity in METs is: 5.07 according to the Duke Activity Status Index (DASI). Per AHA/ACC guidelines, she is deemed acceptable risk for the planned procedure without additional cardiovascular testing. Will route to surgical team so they are aware.    HTN, goal <130/80 - above target today; reported missed doses of medications, uses weekly pill organizer; 05/13/24: creatinine 0.68; 01/23/2024: K+ 4.4 Continue irbesartan  300mg  daily, amlodipine  2.5mg  daily Check BP 3x weekly at home  and follow up via MyChart in 2 weeks Encouraged medication adherence with pill organizer, alarm Consider increasing amlodipine  on follow up to home BP readings if not at goal   HLD, LDL goal <70 / Aortic atherosclerosis / Coronary calcification on CT - last assessed 04/2023 with LDL  88; EKG today: NSR @ 24aef; no indication for ischemia evaluation in absence of anginal symptoms Will update lab work: CMET, A1c, dLDL Consider addition of zetia 10mg  daily if LDL not at goal on rosuvastatin  40mg  daily         Dispo: 6 months  Signed, Reche GORMAN Finder, NP

## 2024-06-23 ENCOUNTER — Ambulatory Visit (HOSPITAL_BASED_OUTPATIENT_CLINIC_OR_DEPARTMENT_OTHER): Payer: Self-pay | Admitting: Family

## 2024-06-23 DIAGNOSIS — E785 Hyperlipidemia, unspecified: Secondary | ICD-10-CM

## 2024-06-23 DIAGNOSIS — Z79899 Other long term (current) drug therapy: Secondary | ICD-10-CM

## 2024-06-23 LAB — COMPREHENSIVE METABOLIC PANEL WITH GFR
ALT: 7 IU/L (ref 0–32)
AST: 15 IU/L (ref 0–40)
Albumin: 4 g/dL (ref 3.7–4.7)
Alkaline Phosphatase: 110 IU/L (ref 48–129)
BUN/Creatinine Ratio: 29 — ABNORMAL HIGH (ref 12–28)
BUN: 19 mg/dL (ref 8–27)
Bilirubin Total: 0.4 mg/dL (ref 0.0–1.2)
CO2: 21 mmol/L (ref 20–29)
Calcium: 10 mg/dL (ref 8.7–10.3)
Chloride: 101 mmol/L (ref 96–106)
Creatinine, Ser: 0.65 mg/dL (ref 0.57–1.00)
Globulin, Total: 2.7 g/dL (ref 1.5–4.5)
Glucose: 82 mg/dL (ref 70–99)
Potassium: 4.4 mmol/L (ref 3.5–5.2)
Sodium: 135 mmol/L (ref 134–144)
Total Protein: 6.7 g/dL (ref 6.0–8.5)
eGFR: 86 mL/min/1.73 (ref >59)

## 2024-06-23 LAB — LDL CHOLESTEROL, DIRECT: LDL Direct: 89 mg/dL (ref 0–99)

## 2024-06-23 LAB — HEMOGLOBIN A1C
Est. average glucose Bld gHb Est-mCnc: 123 mg/dL
Hgb A1c MFr Bld: 5.9 % — ABNORMAL HIGH (ref 4.8–5.6)

## 2024-06-23 MED ORDER — EZETIMIBE 10 MG PO TABS: 10.0000 mg | ORAL_TABLET | Freq: Every day | ORAL | 3 refills | Status: AC

## 2024-06-23 MED ORDER — ROSUVASTATIN CALCIUM 40 MG PO TABS: 40.0000 mg | ORAL_TABLET | Freq: Every day | ORAL | 2 refills | Status: AC

## 2024-06-23 NOTE — Telephone Encounter (Signed)
 Left message for the pt to call back for results.   Pt did view results in her active mychart account that Reche Finder, NP sent to her.    Will go ahead and send in zetia 10 mg po daily to her pharmacy on file and place repeat lipids/ALT in 2 months in the system.  Will mychart the pt below to make her aware of this plan.

## 2024-06-23 NOTE — Telephone Encounter (Signed)
-----   Message from Reche GORMAN Finder sent at 06/23/2024  7:45 AM EST ----- A1c shows improving blood sugar control though still in prediabetes range. Normal kidneys, liver, electrolytes.  LDL (bad cholesterol) not at goal of <70, recommend start Zetia 10mg  daily and  continue Rosuvastatin  40mg  daily. Repeat FLP/ALT in 2 months.  ----- Message ----- From: Interface, [facility] Lab Results In Sent: 06/23/2024   6:37 AM EST To: Reche GORMAN Finder, NP

## 2024-06-27 ENCOUNTER — Encounter (HOSPITAL_BASED_OUTPATIENT_CLINIC_OR_DEPARTMENT_OTHER): Payer: Self-pay | Admitting: Family

## 2024-06-27 ENCOUNTER — Encounter (HOSPITAL_BASED_OUTPATIENT_CLINIC_OR_DEPARTMENT_OTHER): Payer: Self-pay

## 2024-06-29 ENCOUNTER — Ambulatory Visit (INDEPENDENT_AMBULATORY_CARE_PROVIDER_SITE_OTHER)

## 2024-06-29 ENCOUNTER — Institutional Professional Consult (permissible substitution) (INDEPENDENT_AMBULATORY_CARE_PROVIDER_SITE_OTHER): Admitting: Otolaryngology

## 2024-06-29 ENCOUNTER — Encounter (INDEPENDENT_AMBULATORY_CARE_PROVIDER_SITE_OTHER): Payer: Self-pay

## 2024-06-29 VITALS — BP 164/85 | Temp 98.0°F | Wt 155.0 lb

## 2024-06-29 DIAGNOSIS — G629 Polyneuropathy, unspecified: Secondary | ICD-10-CM | POA: Diagnosis not present

## 2024-06-29 DIAGNOSIS — R2689 Other abnormalities of gait and mobility: Secondary | ICD-10-CM | POA: Diagnosis not present

## 2024-06-29 NOTE — Progress Notes (Unsigned)
 HPI:  Discussed the use of AI scribe software for clinical note transcription with the patient, who gave verbal consent to proceed.  History of Present Illness Joy [name] is an 83 year old female with neuropathy and rheumatoid arthritis who presents with imbalance and dizziness.  She has experienced imbalance and dizziness for about a year, characterized by a sensation of 'dancing around' when getting out of bed and more pronounced when moving from sitting to standing. The symptoms improve when lying down. No room-spinning dizziness, but she describes a feeling akin to being on a boat. Vestibular therapy in the past provided some relief.  She has neuropathy and sciatica, particularly affecting her left side. Injections for sciatica provide temporary relief, but she is not currently on medication for neuropathy. When sciatica pain extends to her feet, she experiences bowel incontinence, which led to a fall about six months ago. She has undergone therapy for these issues.  She has rheumatoid arthritis and receives infusions for it. She experiences pain shooting through her ears and cannot wear anything on her head. She has noticed a drop on one side of her jaw, making it difficult to chew.  She reports visual disturbances, including 'squiggles' in her vision, and has been diagnosed with mites by her ophthalmologist. Despite using an expensive medication for six to seven weeks, her eyes still burn, and she has dry eyes. She has had cataract surgery on both eyes.  No frequent headaches, ear infections, or drainage from the ears. She occasionally feels like she might black out, particularly when sitting near a window in her kitchen, previously attributed to dehydration and a UTI. She denies significant hearing loss, although her family suggests otherwise.  She is active and enjoys chair yoga and water  aerobics. She stopped attending the [facility] during COVID-19 but plans to return. She is scheduled for  a knee revision surgery in December.     PMH/Meds/All/SocHx/FamHx/ROS: Past Medical History:  Diagnosis Date   Ankylosing spondylitis (HCC)    Anxiety    Arthritis    RHEUMATOID   Back pain    Bronchitis    Constipation    COPD (chronic obstructive pulmonary disease) (HCC)    CXR 01/11/18 showed mild COPD and chronic bronchitis   CTS (carpal tunnel syndrome)    Dry eye    Dry mouth    Dysrhythmia    irregularity unknown at this time - being evaluated by cardiology   Essential hypertension 11/24/2015   GERD (gastroesophageal reflux disease)    HLA B27 (HLA B27 positive)    Hypercholesteremia    Hyperlipidemia 11/24/2015   Hypertension    IBS (irritable bowel syndrome)    Joint pain    Neuropathy    OAB (overactive bladder)    Obesity    Osteoarthritis    Osteoporosis    Pre-diabetes    Rheumatoid arthritis (HCC) 11/24/2015   Sciatica    Seasonal allergies    Spinal stenosis    Past Surgical History:  Procedure Laterality Date   ABDOMINAL HYSTERECTOMY  1995   BACK SURGERY     BREAST SURGERY     REDUCTION   CATARACT EXTRACTION W/PHACO  08/01/2012   Procedure: CATARACT EXTRACTION PHACO AND INTRAOCULAR LENS PLACEMENT (IOC);  Surgeon: Gaither Quan, MD;  Location: [facility] OR;  Service: Ophthalmology;  Laterality: Right;   EYE SURGERY Bilateral    cataract   HERNIA REPAIR     RIGHT ING.   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  Social Connections: Unknown (03/04/2019)   Social Connection and Isolation Panel    Frequency of Communication with Friends and Family: More than three times a week    Frequency of Social Gatherings with Friends and Family: Not on file    Attends Religious Services: Not  on Marketing Executive or Organizations: Not on file    Attends Banker Meetings: Not on file    Marital Status: Not on file    Current Outpatient Medications:    albuterol  (PROVENTIL ) (2.5 MG/3ML) 0.083% nebulizer solution, Take 3 mLs (2.5 mg total) by nebulization every 6 (six) hours as needed for wheezing or shortness of breath., Disp: 75 mL, Rfl: 5   albuterol  (VENTOLIN  HFA) 108 (90 Base) MCG/ACT inhaler, INHALE 2 PUFFS INTO THE LUNGS EVERY 4 HOURS AS NEEDED FOR WHEEZING OR SHORTNESS OF BREATH, Disp: 6.7 g, Rfl: 0   amLODipine  (NORVASC ) 2.5 MG tablet, TAKE 1 TABLET(2.5 MG) BY MOUTH DAILY, Disp: 90 tablet, Rfl: 0   Apoaequorin (PREVAGEN PO), Take by mouth., Disp: , Rfl:    benzonatate  (TESSALON ) 200 MG capsule, TAKE 1 CAPSULE(200 MG) BY MOUTH THREE TIMES DAILY AS NEEDED FOR COUGH, Disp: 30 capsule, Rfl: 1   Cholecalciferol  (VITAMIN D ) 50 MCG (2000 UT) tablet, Take 2,000 Units by mouth daily., Disp: , Rfl:    diphenhydrAMINE  (BENADRYL ) 25 MG tablet, Take 25 mg by mouth daily as needed for allergies., Disp: , Rfl:    ezetimibe (ZETIA) 10 MG tablet, Take 1 tablet (10 mg total) by mouth daily., Disp: 90 tablet, Rfl: 3   fluticasone  (FLONASE ) 50 MCG/ACT nasal spray, Place 2 sprays into both nostrils daily., Disp: 18.2 mL, Rfl: 2   inFLIXimab  (REMICADE ) 100 MG injection, 10MG /KG Intravenous every 6 weeks, Disp: , Rfl:    irbesartan  (AVAPRO ) 300 MG tablet, Take 1 tablet (300 mg total) by mouth daily., Disp: 30 tablet, Rfl: 0   Iron, Ferrous Sulfate, 325 (65 Fe) MG TABS, Take 1 tablet by mouth daily at 6 (six) AM., Disp: , Rfl:    Magnesium  250 MG TABS, Take 1 tablet (250 mg total) by mouth daily., Disp: 30 tablet, Rfl: 0   Menthol , Topical Analgesic, (ICY HOT EX), Apply 1 Application topically daily as needed (pain)., Disp: , Rfl:    naproxen sodium (ALEVE) 220 MG tablet, Take 440 mg by mouth daily as needed (pain)., Disp: , Rfl:    polyethylene glycol (MIRALAX  / GLYCOLAX )  17 g packet, Take 17 g by mouth daily as needed for moderate constipation., Disp: , Rfl:    Propylene Glycol (SYSTANE BALANCE) 0.6 % SOLN, Place 1 drop into both eyes 2 (two) times daily as needed (dry eyes)., Disp: , Rfl:    rosuvastatin  (CRESTOR ) 40 MG tablet, Take 1 tablet (40 mg total) by mouth daily., Disp: 90 tablet, Rfl: 2   sertraline  (ZOLOFT ) 25 MG tablet, Take 50 mg by mouth daily. , Disp: , Rfl:    Tiotropium Bromide -Olodaterol (STIOLTO RESPIMAT ) 2.5-2.5 MCG/ACT AERS, Inhale 2 puffs into the lungs daily., Disp: , Rfl:    Varenicline Tartrate (TYRVAYA) 0.03 MG/ACT SOLN, Place 1 spray into the nose daily., Disp: , Rfl:    vitamin B-12 (CYANOCOBALAMIN ) 100 MCG  tablet, Take 100 mcg by mouth daily., Disp: , Rfl:  A complete ROS was performed with pertinent positives/negatives noted in the HPI. The remainder of the ROS are negative.   Physical Exam:  BP (!) 164/85 (BP Location: Left Arm, Patient Position: Sitting, Cuff Size: Normal) Comment: Pt did not take BP MEDS Today and she was talking  Temp 98 F (36.7 C)   Wt 155 lb (70.3 kg)   SpO2 95%   BMI 28.35 kg/m  General: Well developed, well nourished. No acute distress. Voice *** Head/Face: Normocephalic. No sinus tenderness. Facial nerve intact and equal bilaterally. ***No facial lacerations. Eyes: PERRL, no scleral icterus or conjunctival hemorrhage. EOMI. Ears: No gross deformity. Normal external canal. Tympanic membrane *** bilaterally Hearing: Normal speech reception. *** Nose: No gross deformity or lesions. No purulent discharge. No turbinate hypertrophy. Mouth/Oropharynx: Lips without any lesions. Dentition ***. No mucosal lesions within the oropharynx. No tonsillar enlargement, exudate, or lesions. Pharyngeal walls symmetrical. Uvula midline. Tongue midline without lesions. Larynx: See TFL if applicable Nasopharynx: See TFL if applicable Neck: Trachea midline. No masses. No thyromegaly or nodules palpated. No  crepitus. Lymphatic: No lymphadenopathy in the neck. Respiratory: No stridor or distress. Room air. Cardiovascular: Regular rate and rhythm. Extremities: No edema or cyanosis. Warm and well-perfused. Skin: No scars or lesions on face or neck. Neurologic: CN II-XII grossly intact. Moving all extremities without gross abnormality. Other:  Independent Review of Additional Tests or Records: None*** Procedures: None*** Impression & Plans:  Assessment and Plan Assessment & Plan Imbalance and unsteadiness due to peripheral neuropathy, left-sided sciatica, and rheumatoid arthritis Chronic imbalance likely due to peripheral neuropathy and joint issues, with visual contribution. Vestibular therapy previously beneficial. No vertigo or inner ear dysfunction. - Recommended vestibular therapy for balance improvement. - Advised slowing down when transitioning from sitting to standing. - Encouraged continued physical activity with caution.  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 - Recommended dental evaluation and potential adjustment of dental appliances.  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 Previous mite treatment ineffective. - Advised follow-up with ophthalmologist to reassess ocular symptoms.  Planned left knee revision surgery and perioperative rehabilitation needs Scheduled for left knee revision surgery on December 16th. Rehabilitation necessary post-surgery for balance and mobility. - Plan for perioperative rehabilitation focusing on balance and mobility post-knee revision surgery.

## 2024-07-08 DIAGNOSIS — M0589 Other rheumatoid arthritis with rheumatoid factor of multiple sites: Secondary | ICD-10-CM | POA: Diagnosis not present

## 2024-07-10 ENCOUNTER — Other Ambulatory Visit (HOSPITAL_BASED_OUTPATIENT_CLINIC_OR_DEPARTMENT_OTHER): Payer: Self-pay

## 2024-07-10 ENCOUNTER — Ambulatory Visit (HOSPITAL_BASED_OUTPATIENT_CLINIC_OR_DEPARTMENT_OTHER): Admitting: Pulmonary Disease

## 2024-07-10 ENCOUNTER — Encounter (HOSPITAL_BASED_OUTPATIENT_CLINIC_OR_DEPARTMENT_OTHER): Payer: Self-pay | Admitting: Pulmonary Disease

## 2024-07-10 VITALS — BP 134/84 | HR 71 | Ht 62.0 in | Wt 156.9 lb

## 2024-07-10 DIAGNOSIS — M069 Rheumatoid arthritis, unspecified: Secondary | ICD-10-CM

## 2024-07-10 DIAGNOSIS — D84821 Immunodeficiency due to drugs: Secondary | ICD-10-CM | POA: Diagnosis not present

## 2024-07-10 DIAGNOSIS — J209 Acute bronchitis, unspecified: Secondary | ICD-10-CM | POA: Diagnosis not present

## 2024-07-10 DIAGNOSIS — B965 Pseudomonas (aeruginosa) (mallei) (pseudomallei) as the cause of diseases classified elsewhere: Secondary | ICD-10-CM | POA: Diagnosis not present

## 2024-07-10 DIAGNOSIS — R918 Other nonspecific abnormal finding of lung field: Secondary | ICD-10-CM | POA: Diagnosis not present

## 2024-07-10 MED ORDER — PREDNISONE 10 MG PO TABS: ORAL_TABLET | ORAL | 0 refills | Status: AC

## 2024-07-10 MED ORDER — AMOXICILLIN-POT CLAVULANATE 875-125 MG PO TABS: 1.0000 | ORAL_TABLET | Freq: Two times a day (BID) | ORAL | 0 refills | Status: AC

## 2024-07-10 MED ORDER — TRELEGY ELLIPTA 200-62.5-25 MCG/ACT IN AEPB: 1.0000 | INHALATION_SPRAY | Freq: Every day | RESPIRATORY_TRACT | Status: AC

## 2024-07-10 NOTE — Progress Notes (Unsigned)
 Synopsis: Referred in 05/2018 for hx of chronic bronchitis x 2 years.   Subjective:   PATIENT ID: Joy [name] GENDER: female DOB: March 09, 1941, MRN: [id number]   HPI  Chief Complaint  Patient presents with   COPD   [name] is an 83 year old female remote smoker with RA on methotrexate and infliximab  who presents for follow-up  Synopsis: 2019 - Established [facility] for longstanding history of bronchitis. Started on Spiriva  2020 - Improved symptoms on Spiriva  however discontinued due to cost.  2022 - Restarted on Spiriva . August - Augmentin . September - steroids. November +pseudomonas treated x 3 weeks with persistent symptoms 2023 -Treated for PA PNA in March. Treated with inhaled tobra for PA colonization 2024 - Chronic bronchitis. Lillis Days but stopped due to cost. Exacerbation in August. GERD issues and recent EGD dilation in Aug.  07/10/24 Since our last visit she was treated for acute on chronic bronchitis in 03/24/24 with cipro  based on prior infections. She has chronic congested cough. Feels frustrated that this is her baseline.  Social History: Quit smoking in 1995. Smoked for 30 years x 1ppd.   Past Medical History:  Diagnosis Date   Ankylosing spondylitis (HCC)    Anxiety    Arthritis    RHEUMATOID   Back pain    Bronchitis    Constipation    COPD (chronic obstructive pulmonary disease) (HCC)    CXR 01/11/18 showed mild COPD and chronic bronchitis   CTS (carpal tunnel syndrome)    Dry eye    Dry mouth    Dysrhythmia    irregularity unknown at this time - being evaluated by cardiology   Essential hypertension 11/24/2015   GERD (gastroesophageal reflux disease)    HLA B27 (HLA B27 positive)    Hypercholesteremia    Hyperlipidemia 11/24/2015   Hypertension    IBS (irritable bowel syndrome)    Joint pain    Neuropathy    OAB (overactive bladder)    Obesity    Osteoarthritis    Osteoporosis    Pre-diabetes    Rheumatoid arthritis (HCC)  11/24/2015   Sciatica    Seasonal allergies    Spinal stenosis     Allergies  Allergen Reactions   Codeine Other (See Comments)    Lump in throat   Tape Hives and Rash    Paper tape only   Latex Rash   Nickel Rash    Bumps Also other metals     Outpatient Medications Prior to Visit  Medication Sig Dispense Refill   albuterol  (PROVENTIL ) (2.5 MG/3ML) 0.083% nebulizer solution Take 3 mLs (2.5 mg total) by nebulization every 6 (six) hours as needed for wheezing or shortness of breath. 75 mL 5   albuterol  (VENTOLIN  HFA) 108 (90 Base) MCG/ACT inhaler INHALE 2 PUFFS INTO THE LUNGS EVERY 4 HOURS AS NEEDED FOR WHEEZING OR SHORTNESS OF BREATH 6.7 g 0   amLODipine  (NORVASC ) 2.5 MG tablet TAKE 1 TABLET(2.5 MG) BY MOUTH DAILY 90 tablet 0   Apoaequorin (PREVAGEN PO) Take by mouth.     benzonatate  (TESSALON ) 200 MG capsule TAKE 1 CAPSULE(200 MG) BY MOUTH THREE TIMES DAILY AS NEEDED FOR COUGH 30 capsule 1   Cholecalciferol  (VITAMIN D ) 50 MCG (2000 UT) tablet Take 2,000 Units by mouth daily.     diphenhydrAMINE  (BENADRYL ) 25 MG tablet Take 25 mg by mouth daily as needed for allergies.     ezetimibe  (ZETIA ) 10 MG tablet Take 1 tablet (10 mg total) by mouth  daily. 90 tablet 3   fluticasone  (FLONASE ) 50 MCG/ACT nasal spray Place 2 sprays into both nostrils daily. 18.2 mL 2   inFLIXimab  (REMICADE ) 100 MG injection 10MG /KG Intravenous every 6 weeks     irbesartan  (AVAPRO ) 300 MG tablet Take 1 tablet (300 mg total) by mouth daily. 30 tablet 0   Iron, Ferrous Sulfate, 325 (65 Fe) MG TABS Take 1 tablet by mouth daily at 6 (six) AM.     Magnesium  250 MG TABS Take 1 tablet (250 mg total) by mouth daily. 30 tablet 0   Menthol , Topical Analgesic, (ICY HOT EX) Apply 1 Application topically daily as needed (pain).     naproxen sodium (ALEVE) 220 MG tablet Take 440 mg by mouth daily as needed (pain).     polyethylene glycol (MIRALAX  / GLYCOLAX ) 17 g packet Take 17 g by mouth daily as needed for moderate  constipation.     Propylene Glycol (SYSTANE BALANCE) 0.6 % SOLN Place 1 drop into both eyes 2 (two) times daily as needed (dry eyes).     rosuvastatin  (CRESTOR ) 40 MG tablet Take 1 tablet (40 mg total) by mouth daily. 90 tablet 2   sertraline  (ZOLOFT ) 25 MG tablet Take 50 mg by mouth daily.      Tiotropium Bromide -Olodaterol (STIOLTO RESPIMAT ) 2.5-2.5 MCG/ACT AERS Inhale 2 puffs into the lungs daily.     Varenicline Tartrate (TYRVAYA) 0.03 MG/ACT SOLN Place 1 spray into the nose daily.     vitamin B-12 (CYANOCOBALAMIN ) 100 MCG tablet Take 100 mcg by mouth daily.     No facility-administered medications prior to visit.    Review of Systems  Constitutional:  Negative for chills, diaphoresis, fever, malaise/fatigue and weight loss.  HENT:  Positive for congestion.   Respiratory:  Positive for cough. Negative for hemoptysis, sputum production, shortness of breath and wheezing.   Cardiovascular:  Negative for chest pain, palpitations and leg swelling.    Objective:   Vitals:   07/10/24 1124  BP: 134/84  Pulse: 71  SpO2: 100%  Weight: 156 lb 14.4 oz (71.2 kg)  Height: 5' 2 (1.575 m)    Physical Exam: General: Well-appearing, no acute distress HENT: [place], AT Eyes: EOMI, no scleral icterus Respiratory: Clear to auscultation bilaterally.  No crackles, wheezing or rales Cardiovascular: RRR, -M/R/G, no JVD Extremities:-Edema,-tenderness Neuro: AAO x4, CNII-XII grossly intact Psych: Normal mood, normal affect  Chest imaging: CXR 01/11/18 - No pulmonary edema, effusion or infiltrate CT HR 07/28/21 - RLL nodule 1.4 x 1 and RUL ground glas nodule. Otherwise normal parenchyma. Moderate/large hiatal hernia. 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 Unchanged coarse interstitial markings suggestive of chronic bronchitis CT Chest 12/21/21 - Right lower lobe nodule decreased from 14mm to 9 mm. Stable GGO. CT Chest 06/07/22 -  Resolved RLL with cyst. Stable nodules including RUL and LUL Interval development of LUL nodules with largest 6mm. CT Chest 12/18/22 - Stable RLL lung nodule. Resolved nodules mentioned before  PFT:  08/21/18  FVC 2.4 (154%) FEV1 2.21 (156%) Ratio 74 TLC 104% DLCO corrected 78% Interpretation: Normal spirometry and lung volumes with mildly reduced DLCO  Mild obstructive defect present with mildly reduced DLCO. TLC normal. No significant bronchodilator effect present however does not preclude benefit of bronchodilator therapy.  Labs: Sputum 07/10/2021 Pseudomonas pansensitive except for intermediate Zosyn Sputum 09/15/2021 Pseudomonas pansensitive except for intermediate Zosyn Sputum 03/26/24 Neg acid fast Sputum respiratory culture 03/26/24 Inadequate Assessment & Plan:  83 year old  female with RA on methotrexate and infliximab , respiratory PA colonization s/p inhaled tobra, OA and HTN who presents for follow-up. Unchanged/worsening bronchitis with recent exacerbation and currently in exacerbation. May need to add on ICS to bronchodilator regimen with understanding that this may increase risk of pneumonia. Will trial triple therapy for effectiveness first.  Pseudomonas colonization - s/p tobramycin . No persistent active symptoms Immunosuppressed patient --Completed tobramycin  in 2023 --If persistent PA, may need to refer to ID again for extended tobra treatment  Acute on chronic bronchitis - in exacerbation ?Aspiration --START Augmentin  (antibiotics)  --START prednisone  taper --START Trelegy 200 sample x 7 days. Do not take Stioloto when on this medication  If symptoms improved, may need to consider ICS/LABA. Unfortunately Trelegy check was $115  --CONTINUE Stiolto TWO puffs in the morning --CONTINUE Albuterol  every 4 hours as needed for shortness of breath or wheezing --Continue Protonix  40 mg daily in setting of known hiatal hernia  Nasal congestion --CONTINUE flonase  1-2 sprays nightly  per nare  RLL lung nodularity- stable 9mm Interval development of RUL 5mm ground glass nodule - likely benign History of waxing and waning nodules. Suspect all benign in currently asymptomatic patient After discussion regarding risks and benefits, would favor to stop surveillance as we would likely not pursue intervention unless her symptoms worsened. No further chest imaging indicated  Immunization History  Administered Date(s) Administered   Fluad Quad(high Dose 65+) 04/21/2019, 05/20/2021   Fluzone  Influenza virus vaccine,trivalent (IIV3), split virus 04/16/2014   INFLUENZA, HIGH DOSE SEASONAL PF 05/20/2018, 05/24/2020   Influenza Split 05/22/2012   Influenza-Unspecified 04/26/2015, 06/16/2019   PFIZER(Purple Top)SARS-COV-2 Vaccination 09/11/2019, 09/25/2019, 05/14/2020, 11/25/2020   Pfizer Covid-19 Vaccine Bivalent Booster 53yrs & up 05/08/2021   Pneumococcal Conjugate-13 04/16/2014   Pneumococcal Polysaccharide-23 04/26/2015   Tdap 10/19/2014   No orders of the defined types were placed in this encounter.  No orders of the defined types were placed in this encounter.  No follow-ups on file.  I have spent a total time of 30-minutes on the day of the appointment including chart review, data review, collecting history, coordinating care and discussing medical diagnosis and plan with the patient/family. Past medical history, allergies, medications were reviewed. Pertinent imaging, labs and tests included in this note have been reviewed and interpreted independently by me.  Ashantee Deupree Slater Staff, MD [facility] 07/10/2024

## 2024-07-10 NOTE — Patient Instructions (Addendum)
 Acute on chronic bronchitis - in exacerbation ?Aspiration --START Augmentin  (antibiotics)  --START prednisone  taper --START Trelegy 200 sample x 7 days. Do not take Stioloto when on this medication  If symptoms improved, may need to consider ICS/LABA. Unfortunately Trelegy check was $115   --CONTINUE Stiolto TWO puffs in the morning --CONTINUE Albuterol  every 4 hours as needed for shortness of breath or wheezing --Continue Protonix  40 mg daily in setting of known hiatal hernia

## 2024-07-13 DIAGNOSIS — M48062 Spinal stenosis, lumbar region with neurogenic claudication: Secondary | ICD-10-CM | POA: Diagnosis not present

## 2024-07-14 DIAGNOSIS — Z96651 Presence of right artificial knee joint: Secondary | ICD-10-CM | POA: Diagnosis not present

## 2024-08-02 ENCOUNTER — Other Ambulatory Visit: Payer: Self-pay | Admitting: Cardiovascular Disease

## 2024-08-02 DIAGNOSIS — I10 Essential (primary) hypertension: Secondary | ICD-10-CM

## 2024-08-03 DIAGNOSIS — H35373 Puckering of macula, bilateral: Secondary | ICD-10-CM | POA: Diagnosis not present

## 2024-08-03 DIAGNOSIS — H43813 Vitreous degeneration, bilateral: Secondary | ICD-10-CM | POA: Diagnosis not present

## 2024-08-03 DIAGNOSIS — H5713 Ocular pain, bilateral: Secondary | ICD-10-CM | POA: Diagnosis not present

## 2024-08-03 DIAGNOSIS — H16223 Keratoconjunctivitis sicca, not specified as Sjogren's, bilateral: Secondary | ICD-10-CM | POA: Diagnosis not present

## 2024-08-04 ENCOUNTER — Telehealth: Payer: Self-pay | Admitting: Neurology

## 2024-08-04 NOTE — Telephone Encounter (Addendum)
 Patient said have neuropathy in feet and hands, but is not being treatment for it. Was on Gabapentin  a while ago but is not taken in a couple of months. Patient ask to be transferred to New Patient Referrals for a earlier appointment with another neurologist.

## 2024-08-05 ENCOUNTER — Inpatient Hospital Stay (HOSPITAL_COMMUNITY): Admit: 2024-08-05 | Admitting: Orthopedic Surgery

## 2024-08-05 SURGERY — TOTAL KNEE REVISION
Anesthesia: Choice | Site: Knee | Laterality: Right

## 2024-09-23 ENCOUNTER — Ambulatory Visit

## 2024-10-05 ENCOUNTER — Ambulatory Visit: Admitting: Neurology

## 2024-10-08 ENCOUNTER — Ambulatory Visit (HOSPITAL_BASED_OUTPATIENT_CLINIC_OR_DEPARTMENT_OTHER): Admitting: Pulmonary Disease
